# Patient Record
Sex: Male | Born: 1935
Health system: Southern US, Community
[De-identification: ages and names within clinical notes are randomized; demographics above are authoritative.]

## PROBLEM LIST (undated history)

## (undated) DIAGNOSIS — Z95810 Presence of automatic (implantable) cardiac defibrillator: Secondary | ICD-10-CM

## (undated) DIAGNOSIS — I2510 Atherosclerotic heart disease of native coronary artery without angina pectoris: Secondary | ICD-10-CM

## (undated) DIAGNOSIS — E8881 Metabolic syndrome: Secondary | ICD-10-CM

## (undated) DIAGNOSIS — E1140 Type 2 diabetes mellitus with diabetic neuropathy, unspecified: Secondary | ICD-10-CM

## (undated) DIAGNOSIS — N529 Male erectile dysfunction, unspecified: Secondary | ICD-10-CM

## (undated) DIAGNOSIS — I5022 Chronic systolic (congestive) heart failure: Secondary | ICD-10-CM

## (undated) DIAGNOSIS — K2210 Ulcer of esophagus without bleeding: Secondary | ICD-10-CM

## (undated) DIAGNOSIS — E785 Hyperlipidemia, unspecified: Secondary | ICD-10-CM

## (undated) DIAGNOSIS — G4730 Sleep apnea, unspecified: Secondary | ICD-10-CM

## (undated) DIAGNOSIS — I10 Essential (primary) hypertension: Secondary | ICD-10-CM

## (undated) DIAGNOSIS — N200 Calculus of kidney: Secondary | ICD-10-CM

## (undated) DIAGNOSIS — IMO0002 Reserved for concepts with insufficient information to code with codable children: Secondary | ICD-10-CM

## (undated) DIAGNOSIS — Z951 Presence of aortocoronary bypass graft: Secondary | ICD-10-CM

## (undated) DIAGNOSIS — H269 Unspecified cataract: Secondary | ICD-10-CM

## (undated) DIAGNOSIS — I119 Hypertensive heart disease without heart failure: Secondary | ICD-10-CM

## (undated) DIAGNOSIS — D126 Benign neoplasm of colon, unspecified: Secondary | ICD-10-CM

## (undated) DIAGNOSIS — K649 Unspecified hemorrhoids: Secondary | ICD-10-CM

## (undated) DIAGNOSIS — E119 Type 2 diabetes mellitus without complications: Secondary | ICD-10-CM

## (undated) DIAGNOSIS — I255 Ischemic cardiomyopathy: Secondary | ICD-10-CM

## (undated) DIAGNOSIS — M1990 Unspecified osteoarthritis, unspecified site: Secondary | ICD-10-CM

## (undated) HISTORY — DX: Chronic systolic (congestive) heart failure: I50.22

## (undated) HISTORY — DX: Hypertensive heart disease without heart failure: I11.9

## (undated) HISTORY — DX: Morbid (severe) obesity due to excess calories: E66.01

## (undated) HISTORY — PX: CARPAL TUNNEL RELEASE: SHX101

## (undated) HISTORY — DX: Atherosclerotic heart disease of native coronary artery without angina pectoris: I25.10

## (undated) HISTORY — PX: LAPAROTOMY: SHX154

## (undated) HISTORY — PX: CARDIAC DEFIBRILLATOR PLACEMENT: SHX171

## (undated) HISTORY — DX: Presence of aortocoronary bypass graft: Z95.1

## (undated) HISTORY — DX: Essential (primary) hypertension: I10

## (undated) HISTORY — PX: EYE SURGERY: SHX253

## (undated) HISTORY — DX: Unspecified hemorrhoids: K64.9

## (undated) HISTORY — DX: Presence of automatic (implantable) cardiac defibrillator: Z95.810

## (undated) HISTORY — DX: Hyperlipidemia, unspecified: E78.5

## (undated) HISTORY — DX: Calculus of kidney: N20.0

## (undated) HISTORY — DX: Type 2 diabetes mellitus with diabetic neuropathy, unspecified: E11.40

## (undated) HISTORY — PX: CATARACT EXTRACTION: SUR2

## (undated) HISTORY — DX: Male erectile dysfunction, unspecified: N52.9

## (undated) HISTORY — PX: RETINOPATHY SURGERY: SHX765

## (undated) HISTORY — DX: Unspecified osteoarthritis, unspecified site: M19.90

## (undated) HISTORY — PX: OTHER SURGICAL HISTORY: SHX169

## (undated) HISTORY — DX: Benign neoplasm of colon, unspecified: D12.6

## (undated) HISTORY — PX: BACK SURGERY: SHX140

## (undated) HISTORY — DX: Reserved for concepts with insufficient information to code with codable children: IMO0002

## (undated) HISTORY — DX: Unspecified cataract: H26.9

## (undated) HISTORY — DX: Ulcer of esophagus without bleeding: K22.10

## (undated) HISTORY — DX: Metabolic syndrome: E88.810

## (undated) HISTORY — DX: Type 2 diabetes mellitus without complications: E11.9

## (undated) HISTORY — DX: Ischemic cardiomyopathy: I25.5

## (undated) HISTORY — DX: Metabolic syndrome: E88.81

## (undated) HISTORY — PX: ABDOMINAL EXPLORATION SURGERY: SHX538

## (undated) HISTORY — DX: Sleep apnea, unspecified: G47.30

---

## 1998-11-25 ENCOUNTER — Encounter: Payer: Self-pay | Admitting: *Deleted

## 1998-11-25 ENCOUNTER — Ambulatory Visit (HOSPITAL_COMMUNITY): Admission: RE | Admit: 1998-11-25 | Discharge: 1998-11-25 | Payer: Self-pay | Admitting: *Deleted

## 1998-12-10 ENCOUNTER — Ambulatory Visit (HOSPITAL_COMMUNITY): Admission: RE | Admit: 1998-12-10 | Discharge: 1998-12-10 | Payer: Self-pay | Admitting: *Deleted

## 1998-12-10 ENCOUNTER — Encounter: Payer: Self-pay | Admitting: *Deleted

## 1998-12-24 ENCOUNTER — Ambulatory Visit (HOSPITAL_COMMUNITY): Admission: RE | Admit: 1998-12-24 | Discharge: 1998-12-24 | Payer: Self-pay | Admitting: *Deleted

## 1998-12-24 ENCOUNTER — Encounter: Payer: Self-pay | Admitting: *Deleted

## 1999-01-14 ENCOUNTER — Encounter: Payer: Self-pay | Admitting: *Deleted

## 1999-01-14 ENCOUNTER — Ambulatory Visit (HOSPITAL_COMMUNITY): Admission: RE | Admit: 1999-01-14 | Discharge: 1999-01-14 | Payer: Self-pay | Admitting: *Deleted

## 1999-05-26 ENCOUNTER — Other Ambulatory Visit: Admission: RE | Admit: 1999-05-26 | Discharge: 1999-05-26 | Payer: Self-pay | Admitting: Gastroenterology

## 1999-05-26 ENCOUNTER — Encounter (INDEPENDENT_AMBULATORY_CARE_PROVIDER_SITE_OTHER): Payer: Self-pay

## 1999-08-27 ENCOUNTER — Encounter: Payer: Self-pay | Admitting: Family Medicine

## 1999-08-27 ENCOUNTER — Encounter: Admission: RE | Admit: 1999-08-27 | Discharge: 1999-08-27 | Payer: Self-pay | Admitting: Family Medicine

## 1999-09-05 ENCOUNTER — Ambulatory Visit (HOSPITAL_COMMUNITY): Admission: RE | Admit: 1999-09-05 | Discharge: 1999-09-05 | Payer: Self-pay | Admitting: Family Medicine

## 1999-09-05 ENCOUNTER — Encounter: Payer: Self-pay | Admitting: Family Medicine

## 1999-10-21 ENCOUNTER — Other Ambulatory Visit: Admission: RE | Admit: 1999-10-21 | Discharge: 1999-10-21 | Payer: Self-pay | Admitting: Podiatry

## 1999-11-18 ENCOUNTER — Encounter: Admission: RE | Admit: 1999-11-18 | Discharge: 1999-12-17 | Payer: Self-pay | Admitting: *Deleted

## 2000-03-31 ENCOUNTER — Ambulatory Visit (HOSPITAL_COMMUNITY): Admission: RE | Admit: 2000-03-31 | Discharge: 2000-03-31 | Payer: Self-pay | Admitting: Family Medicine

## 2000-03-31 ENCOUNTER — Encounter: Payer: Self-pay | Admitting: Family Medicine

## 2000-10-29 ENCOUNTER — Ambulatory Visit (HOSPITAL_COMMUNITY): Admission: RE | Admit: 2000-10-29 | Discharge: 2000-10-30 | Payer: Self-pay | Admitting: Cardiology

## 2000-10-29 ENCOUNTER — Encounter: Payer: Self-pay | Admitting: Cardiothoracic Surgery

## 2000-11-02 ENCOUNTER — Encounter: Payer: Self-pay | Admitting: Cardiothoracic Surgery

## 2000-11-02 ENCOUNTER — Inpatient Hospital Stay (HOSPITAL_COMMUNITY): Admission: RE | Admit: 2000-11-02 | Discharge: 2000-11-08 | Payer: Self-pay | Admitting: Cardiothoracic Surgery

## 2000-11-02 DIAGNOSIS — Z951 Presence of aortocoronary bypass graft: Secondary | ICD-10-CM

## 2000-11-02 HISTORY — DX: Presence of aortocoronary bypass graft: Z95.1

## 2000-11-03 ENCOUNTER — Encounter: Payer: Self-pay | Admitting: Cardiothoracic Surgery

## 2000-11-04 ENCOUNTER — Encounter: Payer: Self-pay | Admitting: Cardiothoracic Surgery

## 2000-11-05 ENCOUNTER — Encounter: Payer: Self-pay | Admitting: Cardiothoracic Surgery

## 2000-11-06 ENCOUNTER — Encounter: Payer: Self-pay | Admitting: Cardiothoracic Surgery

## 2000-12-14 ENCOUNTER — Encounter (HOSPITAL_COMMUNITY): Admission: RE | Admit: 2000-12-14 | Discharge: 2001-03-14 | Payer: Self-pay | Admitting: Cardiology

## 2001-05-19 ENCOUNTER — Encounter: Admission: RE | Admit: 2001-05-19 | Discharge: 2001-05-19 | Payer: Self-pay | Admitting: Cardiothoracic Surgery

## 2001-05-19 ENCOUNTER — Encounter: Payer: Self-pay | Admitting: Cardiothoracic Surgery

## 2001-08-31 HISTORY — PX: DOPPLER ECHOCARDIOGRAPHY: SHX263

## 2002-01-13 ENCOUNTER — Inpatient Hospital Stay (HOSPITAL_COMMUNITY): Admission: EM | Admit: 2002-01-13 | Discharge: 2002-01-17 | Payer: Self-pay | Admitting: Emergency Medicine

## 2002-01-13 ENCOUNTER — Encounter: Payer: Self-pay | Admitting: Emergency Medicine

## 2002-01-17 ENCOUNTER — Encounter: Payer: Self-pay | Admitting: Internal Medicine

## 2004-02-11 ENCOUNTER — Encounter: Admission: RE | Admit: 2004-02-11 | Discharge: 2004-02-11 | Payer: Self-pay | Admitting: Cardiology

## 2004-07-10 ENCOUNTER — Ambulatory Visit: Payer: Self-pay

## 2004-08-12 ENCOUNTER — Encounter: Admission: RE | Admit: 2004-08-12 | Discharge: 2004-08-12 | Payer: Self-pay | Admitting: Cardiology

## 2004-08-31 DIAGNOSIS — D126 Benign neoplasm of colon, unspecified: Secondary | ICD-10-CM

## 2004-08-31 HISTORY — DX: Benign neoplasm of colon, unspecified: D12.6

## 2004-11-18 ENCOUNTER — Ambulatory Visit: Payer: Self-pay | Admitting: Gastroenterology

## 2004-11-25 ENCOUNTER — Ambulatory Visit: Payer: Self-pay | Admitting: Gastroenterology

## 2004-12-03 ENCOUNTER — Encounter (INDEPENDENT_AMBULATORY_CARE_PROVIDER_SITE_OTHER): Payer: Self-pay | Admitting: Specialist

## 2004-12-03 ENCOUNTER — Ambulatory Visit (HOSPITAL_COMMUNITY): Admission: RE | Admit: 2004-12-03 | Discharge: 2004-12-03 | Payer: Self-pay | Admitting: Gastroenterology

## 2004-12-03 ENCOUNTER — Ambulatory Visit: Payer: Self-pay | Admitting: Gastroenterology

## 2005-08-27 ENCOUNTER — Ambulatory Visit: Payer: Self-pay | Admitting: Family Medicine

## 2005-10-27 ENCOUNTER — Encounter: Admission: RE | Admit: 2005-10-27 | Discharge: 2005-10-27 | Payer: Self-pay | Admitting: Cardiology

## 2006-03-08 ENCOUNTER — Ambulatory Visit: Payer: Self-pay | Admitting: Internal Medicine

## 2006-10-19 ENCOUNTER — Encounter: Admission: RE | Admit: 2006-10-19 | Discharge: 2006-10-19 | Payer: Self-pay | Admitting: Cardiology

## 2007-03-08 ENCOUNTER — Ambulatory Visit: Payer: Self-pay | Admitting: Internal Medicine

## 2007-11-01 ENCOUNTER — Ambulatory Visit: Payer: Self-pay | Admitting: Gastroenterology

## 2007-12-07 ENCOUNTER — Encounter: Payer: Self-pay | Admitting: Gastroenterology

## 2007-12-07 ENCOUNTER — Ambulatory Visit: Payer: Self-pay | Admitting: Gastroenterology

## 2008-04-03 ENCOUNTER — Ambulatory Visit: Payer: Self-pay | Admitting: Internal Medicine

## 2008-10-09 ENCOUNTER — Encounter: Admission: RE | Admit: 2008-10-09 | Discharge: 2008-10-09 | Payer: Self-pay | Admitting: Cardiology

## 2008-10-24 ENCOUNTER — Encounter: Payer: Self-pay | Admitting: Internal Medicine

## 2009-01-30 ENCOUNTER — Ambulatory Visit: Payer: Self-pay | Admitting: Internal Medicine

## 2009-01-30 ENCOUNTER — Encounter: Payer: Self-pay | Admitting: Internal Medicine

## 2009-01-30 DIAGNOSIS — E1140 Type 2 diabetes mellitus with diabetic neuropathy, unspecified: Secondary | ICD-10-CM

## 2009-01-30 DIAGNOSIS — I5022 Chronic systolic (congestive) heart failure: Secondary | ICD-10-CM

## 2009-01-30 DIAGNOSIS — E1169 Type 2 diabetes mellitus with other specified complication: Secondary | ICD-10-CM | POA: Insufficient documentation

## 2009-01-30 DIAGNOSIS — E1165 Type 2 diabetes mellitus with hyperglycemia: Secondary | ICD-10-CM

## 2009-01-30 DIAGNOSIS — I509 Heart failure, unspecified: Secondary | ICD-10-CM

## 2009-01-30 DIAGNOSIS — I255 Ischemic cardiomyopathy: Secondary | ICD-10-CM

## 2009-01-30 DIAGNOSIS — I110 Hypertensive heart disease with heart failure: Secondary | ICD-10-CM | POA: Insufficient documentation

## 2009-08-05 ENCOUNTER — Encounter: Payer: Self-pay | Admitting: Internal Medicine

## 2009-08-06 ENCOUNTER — Ambulatory Visit: Payer: Self-pay | Admitting: Internal Medicine

## 2009-08-06 DIAGNOSIS — K208 Other esophagitis: Secondary | ICD-10-CM

## 2009-08-06 DIAGNOSIS — R55 Syncope and collapse: Secondary | ICD-10-CM

## 2009-08-06 DIAGNOSIS — E8881 Metabolic syndrome: Secondary | ICD-10-CM | POA: Insufficient documentation

## 2009-08-06 DIAGNOSIS — E785 Hyperlipidemia, unspecified: Secondary | ICD-10-CM

## 2009-08-06 HISTORY — DX: Syncope and collapse: R55

## 2009-08-26 ENCOUNTER — Ambulatory Visit: Payer: Self-pay | Admitting: Internal Medicine

## 2009-08-29 LAB — CONVERTED CEMR LAB
BUN: 25 mg/dL — ABNORMAL HIGH (ref 6–23)
Basophils Absolute: 0 10*3/uL (ref 0.0–0.1)
CO2: 28 meq/L (ref 19–32)
Calcium: 9.2 mg/dL (ref 8.4–10.5)
Chloride: 103 meq/L (ref 96–112)
Hemoglobin: 13.5 g/dL (ref 13.0–17.0)
Lymphs Abs: 2.1 10*3/uL (ref 0.7–4.0)
MCHC: 35.1 g/dL (ref 30.0–36.0)
MCV: 88.4 fL (ref 78.0–100.0)
Monocytes Absolute: 0.5 10*3/uL (ref 0.1–1.0)
Monocytes Relative: 7.6 % (ref 3.0–12.0)
Neutro Abs: 3.9 10*3/uL (ref 1.4–7.7)
Platelets: 199 10*3/uL (ref 150.0–400.0)
Potassium: 4.3 meq/L (ref 3.5–5.1)
Prothrombin Time: 11 s (ref 9.1–11.7)
RDW: 14.3 % (ref 11.5–14.6)
Sodium: 140 meq/L (ref 135–145)

## 2009-09-02 ENCOUNTER — Observation Stay (HOSPITAL_COMMUNITY): Admission: RE | Admit: 2009-09-02 | Discharge: 2009-09-02 | Payer: Self-pay | Admitting: Internal Medicine

## 2009-09-02 ENCOUNTER — Ambulatory Visit: Payer: Self-pay | Admitting: Internal Medicine

## 2009-09-04 ENCOUNTER — Encounter: Payer: Self-pay | Admitting: Internal Medicine

## 2009-09-06 ENCOUNTER — Telehealth: Payer: Self-pay | Admitting: Internal Medicine

## 2009-09-16 ENCOUNTER — Ambulatory Visit: Payer: Self-pay

## 2009-09-16 ENCOUNTER — Encounter: Payer: Self-pay | Admitting: Internal Medicine

## 2009-09-19 ENCOUNTER — Encounter: Payer: Self-pay | Admitting: Internal Medicine

## 2010-09-21 ENCOUNTER — Encounter: Payer: Self-pay | Admitting: Family Medicine

## 2010-09-23 ENCOUNTER — Ambulatory Visit
Admission: RE | Admit: 2010-09-23 | Discharge: 2010-09-23 | Payer: Self-pay | Source: Home / Self Care | Attending: Internal Medicine | Admitting: Internal Medicine

## 2010-09-23 ENCOUNTER — Encounter: Payer: Self-pay | Admitting: Internal Medicine

## 2010-09-30 NOTE — Cardiovascular Report (Signed)
Summary: Office Visit   Office Visit   Imported By: Sallee Provencal 09/18/2009 13:54:12  _____________________________________________________________________  External Attachment:    Type:   Image     Comment:   External Document

## 2010-09-30 NOTE — Progress Notes (Signed)
Summary: PT DEF IS BEEPING   Phone Note Call from Patient Call back at Home Phone [phone]   Caller: Patient Summary of Call: pt calling regarding his def he said it is beeping. Initial call taken by: Delsa Sale,  September 06, 2009 8:40 AM  Follow-up for Phone Call        Pt will come this morning for evaluation. Chanetta Marshall RN BSN  September 06, 2009 8:55 AM

## 2010-09-30 NOTE — Letter (Signed)
Summary: [facility]  [facility]   Imported By: Marilynne Drivers 11/06/2009 11:47:02  _____________________________________________________________________  External Attachment:    Type:   Image     Comment:   External Document

## 2010-09-30 NOTE — Miscellaneous (Signed)
Summary: ICD serial # correction  Clinical Lists Changes  Observations: Added new observation of ICD SERL#: [id number] (09/16/2009 9:03)       ICD Specifications Following MD:  Cristopher Peru, MD     Referring MD:  Carlyn Reichert ICD Vendor:  Medtronic     ICD Model Number:  [phone]     ICD Serial Number:  [id number] ICD DOI:  09/02/2009     ICD Implanting MD:  Cristopher Peru, MD  Lead 1:    Location: RA     DOI: 01/16/2002     Model #: [id number]     Serial #: [id number]     Status: active Lead 2:    Location: RV     DOI: 01/16/2002     Model #: [id number]     Serial #: [id number]     Status: active  Indications::  CARDIOMYOPATHY  Explantation Comments: 09/02/2009 [facility] Scientific Prizm 1861/250011 explanted.  ICD Follow Up ICD Dependent:  No      Episodes Coumadin:  No  Brady Parameters Mode VVI     Lower Rate Limit:  40      Tachy Zones VF:  210     VT:  170

## 2010-09-30 NOTE — Miscellaneous (Signed)
Summary: Device change out  Clinical Lists Changes  Observations: Added new observation of ICD IMPL DTE: 09/02/2009 (09/04/2009 14:03) Added new observation of ICD SERL#: [id number] (09/04/2009 14:03) Added new observation of ICD MODL#: [id number] (09/04/2009 14:03) Added new observation of ICDMANUFACTR: Medtronic (09/04/2009 14:03) Added new observation of ICDEXPLCOMM: 09/02/2009 [employer] 1861/250011 explanted. (09/04/2009 14:03)       ICD Specifications Following MD:  Cristopher Peru, MD     Referring MD:  Carlyn Reichert ICD Vendor:  Medtronic     ICD Model Number:  [phone]     ICD Serial Number:  [id number] ICD DOI:  09/02/2009     ICD Implanting MD:  Cristopher Peru, MD  Lead 1:    Location: RA     DOI: 01/16/2002     Model #: [id number]     Serial #: [id number]     Status: active Lead 2:    Location: RV     DOI: 01/16/2002     Model #: [id number]     Serial #: [id number]     Status: active  Indications::  CARDIOMYOPATHY  Explantation Comments: 09/02/2009 [facility] Scientific Prizm 1861/250011 explanted.  ICD Follow Up ICD Dependent:  No      Episodes Coumadin:  No  Brady Parameters Mode VVI     Lower Rate Limit:  40      Tachy Zones VF:  210     VT:  170

## 2010-09-30 NOTE — Procedures (Signed)
Summary: wch/jss    Allergies: No Known Drug Allergies   ICD Specifications Following MD:  Cristopher Peru, MD     Referring MD:  Carlyn Reichert ICD Vendor:  Medtronic     ICD Model Number:  [phone]     ICD Serial Number:  [id number] ICD DOI:  09/02/2009     ICD Implanting MD:  Cristopher Peru, MD  Lead 1:    Location: RA     DOI: 01/16/2002     Model #: [id number]     Serial #: [id number]     Status: active Lead 2:    Location: RV     DOI: 01/16/2002     Model #: [id number]     Serial #: [id number]     Status: active  Indications::  CARDIOMYOPATHY  Explantation Comments: 09/02/2009 [facility] Scientific Prizm 1861/250011 explanted.  ICD Follow Up Remote Check?  No Battery Voltage:  3.18 V     Charge Time:  9.0 seconds     Underlying rhythm:  SR ICD Dependent:  No       ICD Device Measurements Atrium:  Amplitude: 1.5 mV, Impedance: 418 ohms, Threshold: 1.25 V at 0.4 msec Right Ventricle:  Amplitude: 16.9 mV, Impedance: 380 ohms, Threshold: 1.0 V at 0.4 msec Shock Impedance: 42/56 ohms   Episodes MS Episodes:  0     Coumadin:  No Shock:  0     ATP:  0     Nonsustained:  0     Atrial Pacing:  40.4%     Ventricular Pacing:  <0.1%  Brady Parameters Mode DDD     Lower Rate Limit:  60     Upper Rate Limit 130 PAV 330     Sensed AV Delay:  330  Tachy Zones VF:  207     VT:  171     Next Cardiology Appt Due:  11/29/2009 Tech Comments:  Wound check appt.  Steri-strips removed by patient.  No redness or edema,  wound well healed.  Normal device function.  RV output decreased to 2.5V today because of chronic leads.  No other changes made.  Dr Wynonia Lawman is primary cards, unsure of follow-up plan.  For now, will put in reminder for Dr Lovena Le for Jan/2012.  Chanetta Marshall RN BSN  September 16, 2009 2:14 PM  MD Comments:  Agree with above.

## 2010-09-30 NOTE — Cardiovascular Report (Signed)
Summary: Office Visit   Office Visit   Imported By: Sallee Provencal 09/12/2009 10:44:30  _____________________________________________________________________  External Attachment:    Type:   Image     Comment:   External Document

## 2010-10-02 NOTE — Assessment & Plan Note (Signed)
Summary: pc2  Medications Added PRILOSEC OTC 20 MG TBEC (OMEPRAZOLE MAGNESIUM) two times a day MELOXICAM 7.5 MG TABS (MELOXICAM) two times a day      Allergies Added: NKDA  Visit Type:  Follow-up Primary Provider:  Edrick Oh, MD   History of Present Illness: Mr. Magness returns today for followup.  He is a pleasant obese 77 yo man with a h/o CHF, s/p ICD, morbid obesity and HTN.  He underwent ICD generator change several months ago with a BIV device placed and the LV lead capped.  At the time it was not thought that his CHF was bad enough to place an LV lead but there was concern that over time it would worsen and he would subsequently require the lead. He has class 2 symptoms. He has had difficulty losing weight. No other complaints.   Current Medications (verified): 1)  Metformin Hcl 1000 Mg Tabs (Metformin Hcl) .... One Half By Mouth Two Times A Day 2)  Clonidine Hcl 0.1 Mg Tabs (Clonidine Hcl) .... One By Mouth Two Times A Day 3)  Glipizide 10 Mg Xr24h-Tab (Glipizide) .... One By Mouth Daily 4)  Furosemide 40 Mg Tabs (Furosemide) .... One By Mouth Two Times A Day 5)  Klor-Con M20 20 Meq Cr-Tabs (Potassium Chloride Crys Cr) .... One By Mouth Daily 6)  Lipitor 40 Mg Tabs (Atorvastatin Calcium) .... One By Mouth Daily 7)  Metoprolol Tartrate 25 Mg Tabs (Metoprolol Tartrate) .... One By Mouth Two Times A Day 8)  Quinapril Hcl 40 Mg Tabs (Quinapril Hcl) .... One By Mouth Daily 9)  Aspirin 81 Mg Tbec (Aspirin) .... One By Mouth Daily 10)  Novolog 100 Unit/ml Soln (Insulin Aspart) .... Uad 11)  Lantus 100 Unit/ml Soln (Insulin Glargine) .... Uad 12)  Prilosec Otc 20 Mg Tbec (Omeprazole Magnesium) .... Two Times A Day 13)  Meloxicam 7.5 Mg Tabs (Meloxicam) .... 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   Back surgery x 3.   Rotator cuff surgery.   Carpal tunnel surgery x 2.   Abdominal surgery with exploration and laparotomy.  Review of Systems       The patient complains of dyspnea on exertion.  The patient denies chest pain, syncope, and peripheral edema.    Vital Signs:  Patient profile:   77 year old male Height:      65 inches Weight:      247 pounds BMI:     41.25 Pulse rate:   61 / minute BP sitting:   148 / 70  (left arm)  Vitals Entered By: Margaretmary Bayley CMA (September 23, 2010 10:00 AM)  Physical Exam  General:  Well developed, well nourished, in no acute distress.obese.   Head:  normocephalic and atraumatic Eyes:  PERRLA/EOM intact; conjunctiva and lids normal. Mouth:  Teeth, gums and palate normal. Oral mucosa normal. Neck:  Neck supple, no JVD. No masses, thyromegaly or abnormal cervical nodes. Chest Wall:  Well healed ICD incision. Lungs:  Clear bilaterally to auscultation with no wheezes rales, or rhonchi. Heart:  Distant.  RRR without murmur.  PMI could not be appreciated.  No S3 or S4 heard. Abdomen:  Bowel sounds positive; abdomen soft and non-tender without masses, organomegaly, or hernias noted. No hepatosplenomegaly. Msk:  Back normal, normal gait. Muscle strength and tone normal. Pulses:  pulses normal in all 4 extremities Extremities:  No clubbing or cyanosis. Neurologic:  Alert and oriented x 3.    ICD Specifications Following MD:  Cristopher Peru, MD      Referring MD:  Carlyn Reichert ICD Vendor:  Medtronic     ICD Model Number:  [phone]     ICD Serial Number:  [id number] ICD DOI:  09/02/2009     ICD Implanting MD:  Cristopher Peru, MD  Lead 1:    Location: RA     DOI: 01/16/2002     Model #: [id number]     Serial #: [id number]     Status: active Lead 2:    Location: RV     DOI: 01/16/2002     Model #: [id number]     Serial #: [id number]     Status: active  Indications::  CARDIOMYOPATHY  Explantation Comments: 09/02/2009 [facility] Scientific Prizm 1861/250011 explanted.  ICD Follow Up Remote Check?  No Battery Voltage:  3.14 V     Charge Time:  9.1 seconds     Underlying rhythm:  SR1.6 ICD Dependent:  No       ICD Device Measurements Atrium:  Amplitude: 1.6 mV, Impedance: 437 ohms, Threshold: 1.25 V at 0.4 msec Right Ventricle:  Amplitude: 13.9 mV, Impedance: 437 ohms, Threshold: 1.0 V at 0.4 msec Shock Impedance: 47/63 ohms   Episodes MS Episodes:  0     Percent Mode Switch:  0     Coumadin:  No Shock:  0     ATP:  0     Nonsustained:  0     Atrial Pacing:  36.9%     Ventricular Pacing:  0.2%  Brady Parameters Mode DDD     Lower Rate Limit:  60     Upper Rate Limit 130 PAV 330     Sensed AV Delay:  330  Tachy Zones VF:  207     VT:  171     Next Cardiology Appt Due:  09/01/2011 Tech Comments:  Lead impedance alert values reprogrammed.   Device function normal.  ROV 1 year with Dr. Lovena Le.  Pt. also follows with Dr. Wynonia Lawman MD Comments:  Agree with above.   Impression & Recommendations:  Problem # 1:  CHRONIC SYSTOLIC HEART FAILURE (123456) His symptoms are class 2.  Continue meds as below. His updated medication list for this problem includes:    Furosemide 40 Mg Tabs (Furosemide) ..... One by mouth two times a day    Metoprolol Tartrate 25 Mg Tabs (Metoprolol tartrate) ..... One by mouth two times a day    Quinapril Hcl 40 Mg Tabs (Quinapril hcl) ..... One by mouth daily    Aspirin 81 Mg Tbec (Aspirin) ..... One by mouth daily  Problem # 2:   [place] 2 DR 1861/250011 (ICD-V45.02) His device is working normally. Impedences are stable and he has had no intercurrent ICD therapies.  Problem # 3:  MORBID OBESITY (ICD-278.01) I have encouraged him to start walking more and eating less.  Patient Instructions: 1)  Your physician wants you to follow-up in:  12 months with Dr  Lovena Le You will receive a reminder letter in the mail two months in advance. If you don't receive a letter, please call our office to schedule the follow-up appointment. 2)  Your physician recommends that you continue on your current medications as directed. Please refer to the Current Medication list given to you today.

## 2010-10-08 NOTE — Cardiovascular Report (Signed)
Summary: Office Visit   Office Visit   Imported By: Sallee Provencal 10/01/2010 15:30:30  _____________________________________________________________________  External Attachment:    Type:   Image     Comment:   External Document

## 2011-01-13 NOTE — Assessment & Plan Note (Signed)
[facility]                         ELECTROPHYSIOLOGY OFFICE NOTE   NAME:Manuel Kline, Manuel Kline                          MRN:          [id number]  DATE:03/08/2007                            DOB:          19-Sep-1931    Manuel Kline returns today for followup.  He is a very pleasant middle age  man, moderately obese with ischemic cardiomyopathy and ICD implantation.  He has a history of syncope with left bundle-branch block.  The patient,  despite all the above, has no severe heart failure.  He is at worst  class II if not class I.  He denies any weakness or other symptoms.   PHYSICAL EXAMINATION:  .  GENERAL:  He is a pleasant middle age man in no distress.  VITAL SIGNS:  The blood pressure today was 116/76, the pulse 74 and  regular.  The respirations were 18, and the weight was 260 pounds.  NECK:  No jugular venous distention.  LUNGS:  Clear bilaterally to auscultation.  No wheezes, rales, or  rhonchi.  CARDIOVASCULAR:  Regular rate and rhythm with normal S1 and S2.  EXTREMITIES:  No edema.   Interrogation of his defibrillator demonstrates a Guidant Prism 2 with P  and R waves of 3 and 9, respectively.  The impedance 564 in the atrium,  534 in the ventricle with a threshold volt of 0.4 in the atrium, 0.6.4  in the ventricle.  The battery voltage 2.61 ohms (MOL2).   IMPRESSION:  1. Ischemic cardiomyopathy.  2. Congestive heart failure.  3. Left bundle-branch block.  4. History of syncope status post none since his device was placed.   DISCUSSION:  Overall, Manuel Kline is stable.  His defibrillator is working  normally.  We will plan to see him back in the office for followup in 1  year.     Manuel Kline. Lovena Le, MD  Electronically Signed    GWT/MedQ  DD: 03/08/2007  DT: 03/08/2007  Job #: [id number]   cc:   Ezzard Standing, M.D.

## 2011-01-13 NOTE — Assessment & Plan Note (Signed)
[facility]                         GASTROENTEROLOGY OFFICE NOTE   NAME:Manuel Kline, Manuel Kline                          MRN:          [id number]  DATE:11/01/2007                            DOB:          Sep 24, 1931    Mr. Helmly is a 77 year old white male diabetic with rather severe  cardiovascular problems revolving around the ischemic cardiomyopathy and  recurrent cardiac arrhythmia requiring external defibrillator  implantation by Dr. Cristopher Peru.  He comes to my office today for  screening for follow-up colonoscopy per his history of colon polyps  going back some 20  years with a colonoscopy three years ago showing  right colon polyps which were removed without difficulty.   Mr. Defreese denies any GI complaint at this time, is having regular bowel  movements without melena or hematochezia.  His appetite is good and his  weight is stable.  He denies upper GI problems.     Loralee Pacas. Sharlett Iles, MD, Quentin Ore, FAGA  Electronically Signed    DRP/MedQ  DD: 11/01/2007  DT: 11/02/2007  Job #: [id number]   cc:   Margarita Rana, M.D.  Juanda Bond. Altheimer, M.D.  Ezzard Standing, M.D.  Champ Mungo. Lovena Le, MD

## 2011-01-13 NOTE — Assessment & Plan Note (Signed)
[facility]                         ELECTROPHYSIOLOGY OFFICE NOTE   NAME:Bolls, ROMEY MARTENSEN                          MRN:          [id number]  DATE:04/03/2008                            DOB:          09/20/1931    Mr. Gleghorn returns today for followup.  He is a very pleasant middle-aged  male with a history of syncope and ischemic cardiomyopathy, who is  status post dual-chamber ICD insertion for all of the above in the  setting of left bundle-branch block.  He returns today for followup.  The patient had no specific complaints today.  He denies chest pain.  He  notes that he had some peripheral edema several weeks ago when he went  up to visit his brother [place] and does admit to some dietary  indiscretion during that visit.  He has had no other symptoms and denies  any intercurrent ICD therapies.   MEDICATIONS:  1. Furosemide 40 twice a day.  2. Actos 40 a day.  3. Metformin 1 g twice daily.  4. Potassium 10, 2 tablets daily.  5. Quinapril 40 mg daily.  6. Clonidine 0.1 twice daily.  7. Lipitor 40 a day.  8. Aspirin 81 a day.  9. Metoprolol 25 twice a day.  10.Glipizide ER 10 mg daily.  11.Multiple vitamin.  12.Lantus 15 units nightly.  13.Glucotrol XL 10 mg daily.   PHYSICAL EXAMINATION:  GENERAL:  He is a pleasant, obese, middle-aged  man, in no acute distress.  VITAL SIGNS:  Blood pressure was 110/70; the pulse 68 and regular;  respirations were 18; and the weight was 261 pounds, down 2 pounds from  his most recent visit.  NECK:  No jugular venous distention.  LUNGS:  Clear bilaterally to auscultation.  No wheezes, rales, or  rhonchi were present.  CARDIOVASCULAR:  Regular rate and rhythm.  Normal S1 and S2.  ABDOMEN:  Soft and nontender.  EXTREMITIES:  No edema.   Interrogation of his defibrillator demonstrates a Guidant Prizm dual-  chamber device with P-waves of 3 and R-waves of 8.  Impedance was 527 in  the atrium and 522 in the  ventricle.  The threshold volt of 0.4 in the A  and 0.84 in the V.  Battery voltage was 2.58 volts (MOL2).  There are no  intercurrent ICD therapies.   IMPRESSION:  1. Ischemic cardiomyopathy.  2. Syncope.  3. Left bundle-branch block.  4. Status post dual-chamber implantable cardioverter-defibrillator      insertion discussed and overall Mr. Ines is stable.  His heart      failure is class II.  I will plan to see the patient back in the      office in 1 year for followup.  I have encouraged him to maintain a      low-sodium diet.     Champ Mungo. Lovena Le, MD  Electronically Signed    GWT/MedQ  DD: 04/03/2008  DT: 04/04/2008  Job #: [id number]   cc:   Ezzard Standing, M.D.

## 2011-01-13 NOTE — Assessment & Plan Note (Signed)
[facility] OFFICE NOTE   NAME:Hazan, JEKAI BARBERI                          MRN:          [id number]  DATE:11/01/2007                            DOB:          1931-09-08    PRIMARY CARE PHYSICIAN:  Juanda Bond. Altheimer, MD   Mr. Pretorius is a 77 year old white male diabetic with rather severe  cardiovascular problems revolving around the ischemic cardiomyopathy and  recurrent cardiac arrhythmia requiring external defibrillator  implantation by Dr. Cristopher Peru.  He comes to my office today for  screening for follow-up colonoscopy per his history of colon polyps  going back some 20  years with a colonoscopy three years ago showing  right colon polyps which were removed without difficulty.   Mr. Kapner denies any GI complaint at this time, is having regular bowel  movements without melena or hematochezia.  His appetite is good and his  weight is stable.  He denies upper GI problems.   PAST MEDICAL HISTORY:  Mr. Pelly has insulin-dependent diabetes, chronic  cardiomyopathy, previous bypass surgery, has a defibrillator placed by  Dr. Cristopher Peru.  He is followed from a cardiology stand point also by  Dr. Tollie Eth, has a history of left bundle branch block and  chronic congestive heart failure.   When I previously saw Mr.  Shim in March of 2006 he was having some  rectal bleeding and otherwise had a negative colonoscopy.  He denies  significant problems from his diabetes in terms of kidney problems, but  does have a history of recurrent nephrolithiasis, and also has a history  of sleep apnea.   MEDICATIONS:  1. Lasix 40 mg twice a day.  2. Actos 45 mg a day.  3. Metformin 1000 mg twice a day.  4. Potassium 20 mEq a day.  5. Quinapril 40 mg a day.  6. Clonidine 0.1 mg twice a day .  7. Lipitor 40 mg a day.  8. Aspirin 81 mg a day.  9. Metoprolol 25 mg twice a day.  10.Glipizide ER 10 mg a day.  11.Lantus 50  units at bedtime.   ALLERGIES:  HE DENIES DRUG ALLERGIES.   FAMILY HISTORY:  Noncontributory in terms of known gastrointestinal  problems.   SOCIAL HISTORY:  The patient is married, lives with his wife and is  retired from plumbing.  He does not smoke or use ethanol.   REVIEW OF SYSTEMS:  Negative today for any active cardiovascular or  pulmonary complaints.  He is apparently doing well with his diabetes,  which is followed by Dr. Elyse Hsu.  Review of systems otherwise  noncontributory.   LABORATORY DATA:  Review of his lab data from March 2006 showed normal  CBC and liver profile.   PHYSICAL EXAMINATION:  GENERAL:  He is an obese-appearing white male in  no acute distress, appearing older than his stated age.  He weighs 264  pounds.  Blood pressure 130/70, and pulse was 72 and regular.  I could  not appreciate stigmata of chronic liver disease.  CHEST:  His chest was generally clear  and he appeared to be in an  irregular rhythm without significant murmurs, gallops, or rubs.  ABDOMEN:  His abdomen was extremely obese without definite organomegaly,  masses or tenderness.  There was trace to +1 peripheral edema without  swollen joints or evidence of acute phlebitis.  MENTAL STATUS:  His mental status was clear.  RECTAL:  Deferred.   ASSESSMENT:  1. A long history of chronic recurrent colon polyps in a 77 year old      white male who has multiple medical problems including cardiac      arrhythmias and a permanent defibrillator implantation.  2. Past history of hemorrhoidal rectal bleeding.  3. Multiple cardiovascular issues as mentioned above.  4. Insulin-dependent diabetes.  5. History of ischemic cardiomyopathy.  6. History of sleep apnea.  7. Chronic aspirin therapy.  8. History of acid reflux disease, previous endoscopy in 2000.  He is      not on proton pump inhibitor therapy at this time.   RECOMMENDATIONS:  I have gone ahead and scheduled Mr. Ripple for  outpatient  colonoscopy at his convenience.  Will make adjustments in his  diabetic medications, but continue him on salicylate therapy.  If he  needs electric collar, we will turn off his defibrillator  during the procedure as per standard protocol.  Will continue all of his  other multiple medications as per his multiple physicians.     Loralee Pacas. Sharlett Iles, MD, Quentin Ore, FAGA  Electronically Signed    DRP/MedQ  DD: 11/01/2007  DT: 11/01/2007  Job #: [id number]   cc:   Margarita Rana, M.D.  Juanda Bond. Altheimer, M.D.  Ezzard Standing, M.D.  Champ Mungo. Lovena Le, MD

## 2011-01-16 NOTE — Procedures (Signed)
[place]. [facility]  Patient:    Manuel Kline, Manuel Kline Visit Number: [id number] MRN: [id number]          Service Type: MED Location: [id number] Attending Physician:  Monico Blitz Dictated by:   Champ Mungo. Lovena Le, M.D. [facility] Proc. Date: 01/16/02 Admit Date:  01/13/2002 Discharge Date: 01/17/2002   CC:         Kerry Hough., M.D.  Juanda Bond. Altheimer, M.D.  Robert A. Hurman Horn., M.D.  Wynona Dove, [facility]   Procedure Report  PROCEDURE PERFORMED:  Implantation of a dual chamber defibrillator.  INDICATIONS:  Ischemic cardiomyopathy with an ejection fraction of 25-30%, syncope worrisome for malignant ventricular arrhythmias in the setting of chronic left bundle branch block.  INTRODUCTION:  The patient is a very pleasant 77 year old man with a history of ischemic heart disease with an ejection fraction of 30-40% in the past. He underwent CABG over a year ago. His ejection fraction today by echo was 25-30%. He was admitted after a syncopal episode where he was said to have been cyanotic and had seizure-like activity. CT scan was negative. The patient has left bundle branch block. He has no heart failure symptoms. He is now referred for ICD implantation.  DESCRIPTION OF PROCEDURE:  After informed consent was obtained, the patient was taken to the diagnostic EP Lab in a fasting state. After the usual preparation and draping, intravenous fentanyl and midazolam was given for sedation. A total of 30 cc of lidocaine was infiltrated into the left infraclavicular region. Electrocautery was utilized to dissect down to the subpectoralis fascia after a 10 cm incision had been placed. The left subclavian vein was demonstrated to be patent after 10 cc of contrast. Dye was injected into the left upper extremity. The vein was punctured x2 with a Guidant Model [id number] bipolar atrial pacing lead and was placed in the right atrium and the  Guidant Model [id number] defibrillation lead was placed in the right ventricle. Mapping was carried out in the right ventricle and out at the RV apex. R waves measured 12 mV with a pacing impedance after the lead was actively fixed was 729 ohms with a pacing threshold of 0.8 V at 0.5 msec. Mapping was then carried out in the atrium with the atrial lead placed in the remnant of the right atrial appendage. At this location P waves measured 2 mV and the pacing threshold was 0.8 V at 0.5 msec with again a pacing impedance of 527 ohms. With both leads in satisfactory position and actively fixed, they were secured to the subpectoralis fascia with figure-of-eight silk sutures. Electrocautery was then utilized to make a subcutaneous pocket. Electrocautery was also utilized to maintain hemostasis. The Guidant Prism 2DR [place] [id number], Serial #[id number] dual chamber defibrillation system was subsequently connected to the defibrillator and pacing lead and secured and placed in the subcutaneous pocket. Kanamycin irrigation utilized to irrigate the pocket before and after insertion of the defibrillator. A silk suture was utilized to secure the generator to the fascia. At this point defibrillation threshold testing was carried out.  After the patient was more deeply sedated with intravenous fentanyl and midazolam, VF was induced with a T wave shock. A 14 joule shock subsequently terminated the ventricular fibrillation and restored sinus rhythm. Five minutes was allowed to elapse and a second DFT test was carried. Again VF was induced with a T wave shock and a 14 joule shock through the defibrillator restored sinus rhythm.  The duration was 7 seconds for each defibrillation test. The defibrillation impendance were 47 and 48 ohms respectively for each test.  At this point no additional DFT testing was carried out and the generator was secured with silk suture. Additional Kanamycin was utilized to irrigate  the incision and the incision closed in a layer of 2-0 Vicryl followed by a layer of 3-0 Vicryl, followed by a layer of 4-0 Vicryl. Benzoin was painted on the skin and Steri-Strips were applied and a pressure dressing placed and the patient returned to his room in satisfactory condition.  COMPLICATIONS:  There were no immediate procedural complications.  RESULTS:  This demonstrates successful implantation of a Guidant dual chamber defibrillation system in a patient with ischemic cardiomyopathy and an ejection fraction of 25-30%, status post coronary artery bypass grafting, with a history of syncope worrisome for malignant ventricular arrhythmias and left bundle branch block. Dictated by:   Champ Mungo. Lovena Le, M.D. [place] Attending Physician:  Monico Blitz DD:  01/16/02 TD:  01/18/02 Job: [id number]

## 2011-01-16 NOTE — Consult Note (Signed)
[place]  Patient:    Manuel Kline, Manuel Kline                          MRN: [id number] Proc. Date: 10/29/00 Adm. Date:  [id number] Disc. Date: [id number] Attending:  Monico Blitz                          Consultation Report  In-house consult requested by Dr. Wynonia Lawman for coronary occlusive disease. The patient is also followed by Dr. Elyse Hsu, endocrinologist and Dr. Mariea Clonts, [facility].  HISTORY OF PRESENT ILLNESS:  The patient is a 77 year old male with known coronary artery disease who had previously undergone cardiac catheterization in 1992 because of chest pain.  At that time, he had a 40-50% left anterior descending with disease.  In 1999, he had a negative stress test, but reduced exercise tolerance.  He has been diagnosed with diabetes and has complications of retinopathy and peripheral neuropathy.  Most recently, over the past month, the patient has had several episodes of shortness of breath and chest discomfort, nonradiating when in stressful situations or when becomes excited, such as watching wrestling on television. He has had some diaphoresis with episodes, no nausea or vomiting.  He has also had intermittent ______ sensation in the neck over the past month.  He has had a history of 7 to 8 years of vague similar symptoms, but has none over the past month.  He has had previous cardiac history, he has had no previous myocardial infarction, no previous angioplasty, no previous surgery, was cathed 10 years ago.  Cardiac risk factors include hypertension, hyperlipidemia which has intermittently been treated, markedly positive family history for diabetes and coronary artery disease with his father dying at age 87 and mother died at age 94.  He has had a sister with CAB x 2.  He does have type 2 diabetes first diagnosed in 1993.  His most recent hemoglobin A1c was a little over 7, in the endocrinologists office in the past week  has started seeing Dr. Elyse Hsu.  He has no known renal insufficiency.  PAST MEDICAL HISTORY: 1. Chronic back pain with epidural injections. 2. History of "spot on the lung".  This has been followed by Dr. Mariea Clonts with    serial CT scans, chest x-rays and as far as reports indicate, has been no    change over 1-1/2 years. 3. Peripheral neuropathy. 4. History of kidney stones.  PAST SURGICAL HISTORY:  Left foot surgery, right rotator cuff, back surgery x 3, abdominal surgery secondary to necrotic tissue 10 or 15 years ago by Dr. Dalbert Batman, bilateral carpal tunnel and lithotripsy.  SOCIAL HISTORY:  The patient is in second marriage, has four children, lives with his wife.  He is a retired Development worker, community.  FAMILY HISTORY:  Positive for coronary artery disease and diabetes as noted above.  MEDICATIONS: 1. Clonidine 0.2 mg p.o. b.i.d. 2. One aspirin a day. 3. Cardizem CD 240 a day. 4. Glucotrol 5 mg a day. 5. Lozol 1.25 mg a day. 6. Glucophage 1000 mg b.i.d., currently on hold. 7. Lipitor 40 mg q.h.s., just recently started. 8. Lantus insulin 16 units q.h.s.  ALLERGIES:  No known drug allergies.  CARDIAC REVIEW OF SYSTEMS:  Positive for resting shortness of breath, exertional shortness of breath, palpitations, chest pain, he denies lower extremity edema, syncope, presyncope or orthopnea.  GENERAL REVIEW OF SYSTEMS:  The  patient is moderately obese, but notes that he has had some weight loss over the past year.  He denies any recurrent pulmonary infections or hemoptysis.  Gastrointestinal complaints:  The patient does note some increased indigestion recently, it is unclear if this is GI or cardiac symptoms.  Neurologic complaints:  None.  Musculoskeletal exam:  The patient complains of pain in the right shoulder and chronic low back pain and numbness along the left lateral ______ after his back surgery.  GU:  The patient notes impotence.  Infection:  No recent infections.  He was  recently immunized for flu and pneumococcal infections by Dr. Elyse Hsu.  Hematologic: The patient has had no bleeding diathesis or known anemia.  Endocrine has been recently evaluated by Dr. Elyse Hsu for his diabetes mellitus, which has been long-standing.  Denies any thyroid disease.  Denies any psychiatric history. Denies claudication with ambulation, though he does have numbness and tingling in both feet.  VITAL SIGNS:  Blood pressure 130/85, heart rate is 78 sinus, respiratory rate is 18 without distress, ______ saturations 95%.  In general, the patient is moderately obese, he is alert and oriented, and able to relate his history. Neck is without palpable masses or thyromegaly, there is no supraclavicular adenopathy.  He has no carotid bruits.  Breath sounds are present bilaterally without wheezing.  Cardiac exam reveals a regular rate and rhythm without murmur or gallop.  Abdominal exam shows a moderately obese abdomen without palpable masses or dilatation of the aorta, but due to his size, an aortic aneurysm could easily not be palpated.  Lower extremities:  Patient has palpable DP and PT pulses bilaterally, 1+ each.  He appears to have adequate vein in both lower extremities for bypass.  The skin in the lower extremities is without ischemic changes.  LABORATORY DATA:  On February 26th, his hematocrit was 41.5, hemoglobin 13.9, white count 7.6, PT/PTT were normal, no recent cholesterol was recorded. Sodium 138, potassium 4.6, BUN 18, creatinine 1.0, glucose of 98.  EKG shows normal sinus rhythm without acute changes.  Chest x-ray has a 3 mm subpleural nodule left upper lobe which has been followed on previous CT scans and chest x-ray.  Cardiac catheterization shows global hypokinesis with ejection fraction of 35%.  Patient has 40-50% left main, 90% proximal LAD with significant distal disease.  Circumflex is 90% distal lesion, which may not have enough vessel distal to the  lesion for bypass.  Right coronary artery is diffusely diseased with a 90% bifurcation of the PD and acute marginal.  The posterolateral  branches are small and diffusely disease and probably not bypassable. Overall, the patient has three-vessel coronary artery disease, but with very poor distal targets.  EKG not done at [facility], but one copy from Dr. Ezekiel Slocumb office shows left bundle branch block.  IMPRESSION: 1. Coronary occlusive disease with increasing anginal symptoms with exertion. 2. Left bundle branch block. 3. Type 3 diabetes with poor control. 4. Hypertensive cardiac disease. 5. Hyperlipidemia. 6. Osteoarthritis.  PLAN:  Because of the patients diabetes, three-vessel coronary artery disease and symptoms consistent with ischemic heart disease, coronary artery bypass grafting has been recommended with some limitations, discussed with the patient because of his significant distal disease.  The risks of the procedure, including death infection, stroke and myocardial infarction, bleeding, and blood transfusion have been discussed with the patient and his wife and daughter in detail.  Specifically talked about the risks of renal insufficiency and wound infection because of the diabetes.  The  patient has had his questions answered and is willing to proceed.  He is tentatively for discharge to home tomorrow with marked limitations in his activity including no driving and will return on Tuesday, March 5 to proceed with bypass surgery. He and his family were counseled that should he have any recurrent pain, to call 911 and immediately return to the hospital.  Patients previous hematocrit was 41, today it appears to be 33 without any source of blood loss. Will repeat this in the morning along with check of his creatinine. DD:  10/29/00 TD:  10/31/00 Job: [id number]

## 2011-01-16 NOTE — Discharge Summary (Signed)
[facility] K[facility]. [facility]  Patient:    Manuel Kline, Manuel Kline                          MRN: [id number] Adm. Date:  [id number] Disc. Date: 11/08/00 Attending:  Montine Circle Dictator:   Earnstine Regal, P.A. CC:         Kerry Hough., M.D.  Juanda Bond. Altheimer, M.D.  Robert A. Hurman Horn., M.D.   Referring Physician Discharge [place]  DATE OF BIRTH:  October 28, 2031  REFERRING PHYSICIANS: 1. W. Doristine Church., M.D. 2. Juanda Bond. Altheimer, M.D. 3. Audree Camel. Hurman Horn., M.D.  ADMISSION DIAGNOSES: 1. Angina with 40-50% left main, 90% left anterior descending, 90% left    circumflex, and right coronary artery with 90% stenosis at the posterior    descending/acute marginal bifurcation. 2. Adult onset diabetes mellitus, non-insulin dependent with poor control. 3. Hypertension. 4. Hyperlipidemia. 5. Obesity.  DISCHARGE DIAGNOSES: 1. Angina with 40-50% left main, 90% left anterior descending, 90% left    circumflex, and right coronary artery with 90% stenosis at the posterior    descending/acute marginal bifurcation. 2. Adult onset diabetes mellitus, non-insulin dependent with poor control. 3. Hypertension. 4. Hyperlipidemia. 5. Obesity. 6. Postoperative anemia.  PROCEDURES:  Coronary artery bypass grafting x 5, left internal mammary artery to the left anterior descending, saphenous vein graft to the acute marginal and posterolateral sequentially, saphenous vein graft to the distal OM1, and saphenous vein graft to the diagonal on November 02, 2000.  BRIEF HISTORY:  The patient is a 77 year old white male, a medical patient of W. Doristine Church., M.D., Juanda Bond. Altheimer, M.D., and Audree Camel. Hurman Horn., M.D., at [place].  The patient presented with several episodes of shortness of breath and chest discomfort, nonradiating, in stressful situation or when he became excited.  This included watching wrestling on TV.  He has diaphoresis  with these episode, but no nausea or vomiting.  He has also had intermittent discomfort in the neck over the past month.  He has a seven to eight-year history of vague symptoms prior to that. He underwent cardiac catheterization 10 years ago.  Cardiac risk factors included hypertension and hyperlipidemia and a positive family history.  He recently underwent study which showed severe three-vessel coronary artery disease and he was referred to CVTS and Edward B. Servando Snare, M.D., for further evaluation and treatment.  PAST MEDICAL HISTORY:  Chronic back pain with epidural injections.  He has a history of a spot on his lung followed by Herbie Baltimore A. Hurman Horn., M.D., with serial CT scans and chest scans, persistent neuropathy, and a history of kidney stones.  He also has adult onset diabetes mellitus, non-insulin dependent, poorly controlled.  PAST SURGICAL HISTORY:  He has also had left foot surgery, right rotator cuff and back surgery, abdominal surgery secondary to necrotic tissue 10-15 years ago by Edsel Petrin. Dalbert Batman, M.D., bilateral carpal tunnel procedures, and a lithotripsy.  SOCIAL HISTORY:  The patient is in his second marriage.  He has four children. He lives with his wife.  He is a retired Development worker, community.  FAMILY HISTORY:  Positive for coronary artery disease and diabetes.  MEDICATIONS ON ADMISSION: 1. Clonidine 0.2 mg p.o. b.i.d. 2. Aspirin 325 mg q.d. 3. Cardizem 240 mg q.d. 4. Glucotrol 5 mg one q.d. 5. Lozol 1.25 mg q.d. 6. Glucophage 1 g p.o. b.i.d. 7. Lipitor 40 mg p.o. q.h.s. 8. Lantus Insulin 16 units  subcu q.h.s.  For further history and physical, please see the dictated note.  HOSPITAL COURSE:  The patient has been evaluated as an outpatient by D. Marlyn Corporal, M.D.  It was his opinion that the patient was a good surgical candidate.  The risks and benefits were discussed in detail.  Informed operative consent was obtained.  The patient was admitted, taken to  the operating room, and underwent CABG x 5 as noted above.  He tolerated the procedure well and was transferred to the intensive care unit in satisfactory condition.  He was maintained postoperatively on an insulin drip.  He remained hemodynamically stable.  He was doing well and he was weaned from the ventilator.  On the first postoperative morning, the cardiac index was 1.9 and the glucose was 144-123.  The patients wounds looked good.  He was hemodynamically stable.  Previously he did have some concerns with his renal function which had shown that he had changed from 1.4 to a high of 2.7.  He also had trouble with his O2 saturations and required renal dose dopamine. His creatinine was up to 3 by November 04, 2000.  He showed steady improvement and was followed by Juanda Bond. Altheimer, M.D., for his sugars.  He made slow improvement and was ultimately transferred to the floor.  He underwent into phase I cardiac rehabilitation with good results, ambulating up to 500 feet with O2 saturations of 93.7 on November 06, 2000.  He continues to make good steady progress.  His wounds are healing nicely.  It was Dr. Tharon Aquas Trigts opinion that he will be ready for discharge in the a.m. of November 08, 2000.  At this point, his only postoperative complication has been postoperative anemia. His hemoglobin was 8.4 with a hematocrit of 24.6, a white count of 8.7, and platelets are 317,000.  Electrolytes are back to normal with a sodium of 136, a potassium of 3.8, a chloride of 100, a CO2 of 29, a BUN of 27, a creatinine of 1.2, and a glucose of 147.  The patient has been started on iron and Epogen.  If he continues to do well and has no other postoperative difficulties, we will plan to discharge him home in the a.m. of November 08, 2000.  DISCHARGE MEDICATIONS:  He will resume his preoperative medications, including  Glucophage 1 g p.o. b.i.d. a.c., Glucotrol 10 mg p.o. q.a.m. a.c., Lantus Insulin 16 units q.d.  h.s., Lozol 1.25 mg q.d., clonidine 0.2 mg q.d., Lipitor 40 mg q.d., and a multivitamin one q.d.  Niferex 150 mg q.d. is new.  Also new will be potassium 20 mEq q.d. x 10, Lasix 40 mg one q.d. x 10, Lopressor 50 mg p.o. b.i.d., Darvocet-N 100 one to two p.o. q.4h. p.r.n., Ecotrin 325 mg one q.d.  DISPOSITION:  The patient has done well postoperatively.  He had one brief episode of atrial fibrillation and has been treated with additional beta blockers.  He has remained in sinus rhythm outside of that one strip.  His wounds look good.  We plan discharge home in the a.m. with the above-listed medications.  FOLLOW-UP:  He will return in two weeks to see W. Tollie Eth, Brooke Bonito., M.D., and in two weeks to have his staples removed from his leg.  He will follow up with Percell Miller B. Servando Snare, M.D., in the office.  Our office will call and make those arrangements.  DISCHARGE ACTIVITY:  Light to moderate as instructed.  No lifting over 10 pounds.  No driving.  No strenuous activity.  DISCHARGE LABORATORY DATA:  Electrolytes are normal with a CO2 of 26, a BUN of 35, a creatinine of 1.4, and a glucose of 122 on November 06, 2000.  The hemoglobin is currently 8.4 with a hematocrit of 24.6 and platelets are 8403.  CONDITION ON DISCHARGE:  Improving. DD:  11/07/00 TD:  11/07/00 Job: [id number]

## 2011-01-16 NOTE — Op Note (Signed)
[place]. [facility]  Patient:    Manuel Kline, Manuel Kline                         MRN: [id number] Proc. Date: 11/02/00 Attending:  Lilia Argue. Servando Snare, M.D. CC:         Kerry Hough., M.D.   Operative Report  PREOPERATIVE DIAGNOSIS:  Coronary occlusive disease with unstable angina and diabetes mellitus.  POSTOPERATIVE DIAGNOSIS:  Coronary occlusive disease with unstable angina and diabetes mellitus.  PROCEDURE:  Coronary artery bypass grafting x 5 with the left internal mammary to the left anterior descending coronary artery, reversed saphenous vein graft to the diagonal coronary artery, reversed saphenous vein graft to the distal obtuse marginal coronary artery, and sequential reversed saphenous vein graft to the acute margin and distal right coronary artery.  SURGEON:  Lilia Argue. Servando Snare, M.D.  ASSISTANT:  Sheliah Hatch, P.A.  BRIEF HISTORY:  Patient is a 77 year old male with longstanding diabetes, who presented with increasing unstable anginal symptoms over the preceding month. The patient was seen by Dr. Tollie Eth for cardiac catheterization.  In addition, because of his poorly controlled diabetes, he was also referred to Dr. Juanda Bond. Altheimer for further evaluation.  Cardiac catheterization revealed significant 3-vessel coronary artery disease with distal disease.  He had a large first obtuse marginal coronary artery with a proximal and a very distal obstruction, had 50-60% left main, multiple 70-80% stenoses of the LAD, 90% stenosis of the diagonal, a 90% right coronary artery obstruction just prior to the takeoff of a large acute marginal supplying the inferior surface of the heart and diffuse distal disease throughout the distal right coronary artery system.  Overall ventricular function was moderately depressed. Because of the patients symptoms and severe 3-vessel disease, coronary artery bypass grafting was recommended.  The patient  agreed and signed informed consent.  DESCRIPTION OF PROCEDURE:  With Swan-Ganz and arterial line monitors in place, patient underwent general endotracheal anesthesia without incident.  Skin of the chest and legs was prepped with Betadine and draped in usual sterile manner.  Vein was harvested from the right lower extremity and was of adequate quality and caliber.  A median sternotomy was performed.  Left internal mammary artery was dissected down as a pedicle graft.  Distal artery was divided and had good free-flow.  The pericardium was opened.  Patient was systemically heparinized.  The ascending aorta and the right atrium were cannulated and he did have evidence of left ventricular hypertrophy.  The patient was systemically heparinized, ascending aorta and the right atrium were cannulated and aortic vent/cardioplegia needle was introduced into the ascending aorta.  Patient was placed on cardiopulmonary bypass at 2.4 L/min/m.sq.  Sites of anastomoses were selected and dissected out of the epicardium.  Patients body temperature was cooled to 30 degrees, aortic cross-clamp was applied and 500 cc of cold-blood potassium cardioplegia was administered.  Patients LAD and diagonal were both significantly buried in the epicardium, but were located and dissected out.  Aortic cross-clamp was applied and 500 cc of cold-blood potassium cardioplegia was administered with rapid diastolic arrest of the heart.  Myocardial septal temperature was monitored throughout the cross-clamp.  Attention was turned first to the very distal obtuse marginal, which was opened and was even in its distal portion was a reasonable size vessel, easily admitted a 1.5 mm probe. Using a running 7-0 Prolene distal anastomosis was performed.  Attention was then turned to the diagonal  coronary artery, which had been dissected out of the intramyocardial position.  The vessel was opened, was slightly small, but did admit a 1.5 mm  probe.  Using a running 7-0 Prolene, distal anastomosis was performed.  Attention was then turned to the acute marginal coronary artery, which was a 1.5 mm vessel that extended onto the inferior surface of the heart. Using a short natural Y, an end-to-side anastomosis was carried out. The very distal right coronary artery after the just at the takeoff of a small posterior descending vessel, the distal right coronary artery was opened and admitted a 1.5 mm probe distally into the posterolateral branch.  The distal extent of the vein graft to the acute marginal was then anastomosed with a running 7-0 Prolene. Additional cold-blood cardioplegia was administered. Attention was then turned to the left anterior descending coronary artery, which was opened between the mid and distal third of the vessel.  Using a running 8-0 Prolene, the left internal mammary artery was anastomosed to the left anterior descending coronary artery.  With release of Edwards bulldog on the mammary artery, there was appropriate rise in myocardial septal temperature.  Aortic cross-clamp was removed with a total cross-clamp time of 73 minutes.  Patient spontaneously converted to a sinus rhythm.  Partial occlusion clamp was placed on the ascending aorta.  Three punch aortotomies were performed.  Each of the three vein grafts were anastomosed to the ascending aorta.  Air was evacuated from the grafts and the partial occlusion clamp was removed.  Sites of anastomoses were inspected and free of bleeding. Patient was then ventilated and weaned from cardiopulmonary bypass without difficulty.  He remained hemodynamically stable.  He was decannulated in usual fashion.  Protamine sulfate was administered with operative field hemostatic. Two atrial and two ventricular pacing wires were applied.  Graft markers applied.  A left pleural tube and two mediastinal tubes were left in place. Sternum was closed with #6 stainless steel wire.   Fascia closed with interrupted 0 Vicryl, running 3-0 Vicryl in subcutaneous tissue, 4-0  subcuticular stitch in the skin edges.  Dry dressings were applied.  Sponge and needle count was reported as correct at completion of the procedure. Patient tolerated the procedure well without obvious complication and was transferred to surgical intensive care unit for further postoperative care. DD:  11/05/00 TD:  11/05/00 Job: [id number]

## 2011-01-16 NOTE — Discharge Summary (Signed)
[place]. [facility]  Patient:    [name], Manuel Kline                          MRN: [id number] Adm. Date:  [id number] Disc. Date: [id number] Attending:  Montine Circle Dictator:   Lestine Box, RNFA CC:         Kerry Hough., M.D.  Juanda Bond. Altheimer, M.D.  Robert A. Hurman Horn., M.D.   Discharge Summary  ADDENDUM   With his history of renal insufficiency, we will discontinue the Glucophage postoperatively.  He has also been instructed to follow up with Dr. Elyse Hsu, his primary care Manuel Kline, next week. DD:  11/08/00 TD:  11/09/00 Job: [id number]

## 2011-01-16 NOTE — Cardiovascular Report (Signed)
[place]. [facility]  Patient:    Manuel Kline, Manuel Kline                          MRN: [id number] Proc. Date: 10/29/00 Adm. Date:  [id number] Disc. Date: [id number] Attending:  Monico Blitz CC:         Audree Camel Hurman Horn., M.D.  Juanda Bond. Altheimer, M.D.   Cardiac Catheterization  PROCEDURE:  Cardiac catheterization.  CARDIOLOGIST:  Kerry Hough., M.D.  HISTORY:  A 77 year old male with recent progressive angina and a high-risk stress Cardiolite with transient cavity dilation and multiple perfusion defects.  COMMENTS ABOUT PROCEDURE:  The patient tolerated the procedure well without complications.  At the end of the procedure, he had good pulses present. Please see the attached catheterization log for the remainder of the details and catheters used.  HEMODYNAMIC DATA:  Aorta post contrast 130/80 LV post contrast 130/18.  ANGIOGRAPHIC DATA:  Left ventriculogram:  Performed in the 30 degree RAO projection.  The aortic valve was normal.  The mitral valve was normal.  The left ventricle was slightly dilated.  There appears to be mild to moderate diffuse hypokinesis of the LV with an estimated ejection fraction of around 40%.  Coronary arteries arise and distribute normally.  Heavy calcification is noted in the left main, proximal circumflex, LAD, and the mid and proximal right coronary arteries.  The left main coronary artery is calcified with an eccentric 40 to 50% proximal left main stenosis.  There is a 30% distal left main stenosis present.  The left anterior descending is heavily calcified proximally.  There is an eccentric segmental 90% stenosis in the proximal portion of the artery.  There is moderate diffuse disease up to a diagonal branch and an area of severe segmental disease estimated at 80 to 90% after the diagonal branch.  There is moderate distal disease involving the left anterior descending.  The circumflex coronary  artery gives off a very large obtuse marginal artery which has a severe 90% proximal stenosis.  There is a distal 90 to 95% stenosis in the vessel also.  Two small posterolateral branches are noted, and there is some collateral filling seen from the left coronary system.  These are moderately and diffusely diseased.  The right coronary artery is heavily calcified.  There is a proximal 60 to 70% stenosis in an area of moderate disease.  A large acute marginal branch supplies the posterior descending artery, and there is a severe 90% stenosis which involves the origin of this and a continuation branch of the right coronary artery.  There is a 70% segmental stenosis in a continuation branch prior to the posterior descending artery.  There is mild diffuse disease in the distal vessel.  IMPRESSION: 1. Moderate left main severe native three-vessel coronary atherosclerotic    heart disease with some distal disease. 2. Depressed left ventricular function with estimated ejection fraction of    40%.  RECOMMENDATIONS:  Coronary artery bypass grafting. DD:  10/29/00 TD:  10/31/00 Job: [id number]

## 2011-01-16 NOTE — H&P (Signed)
[place]. [facility]  Patient:    Manuel Kline, Manuel Kline Visit Number: [id number] MRN: [id number]          Service Type: MED Location: 2000 2033 01 Attending Physician:  Kerry Hough Md Dictated by:   Kerry Hough., M.D. Admit Date:  01/13/2002   CC:         Juanda Bond. Altheimer, M.D.             Robert A. Hurman Horn., M.D.   History and Physical  REASON FOR ADMISSION: Passed out.  HISTORY OF PRESENT ILLNESS: This 77 year old male has a prior history of coronary artery disease.  He had coronary diagnosed in March 2002 after dyspnea and a new left bundle branch block, and a strongly positive stress Cardiolite study.  Catheterization showed left main and three-vessel coronary artery disease and he underwent coronary artery bypass grafting by Dr. Ceasar Mons, with an internal mammary graft to the LAD, a vein graft to the diagonal, a vein graft to the OM, and a vein graft to the acute marginal and distal right coronary artery.  He had some mild renal insufficiency postoperatively.  He has been followed by Dr. Elyse Hsu but has been getting along fairly well.  He has not had any recurrent chest pain and had been feeling fine.  He was recently started on Actos by Dr. Elyse Hsu approximately one week ago for elevated sugars.  His sugars have been somewhat better this week.  He presented to the emergency room with a syncopal episode today that occurred this morning.  His son-in-law was in the room with him.  He had just turned and the patient had sudden onset of syncope.  He described a slight pain inhis head prior to the episode but no experience of tachy palpitations and no promontory symptoms.  The patient fell suddenly, striking his arm and his head.  Seizure activity was witnessed by the son-in-law as well as incontinence.  He regained consciousness after approximately two to three minutes.  He was blue and gray, and very diaphoretic.  EMS was  called and he was transported here.  A CT scan of the head showed no acute abnormality. EKG showed a left bundle branch block which was chronic.  He had no significant chest pain or mid sternal pain like he had previously.  He is admitted at this time to evaluate syncope.  PAST MEDICAL HISTORY:  1. Diabetes mellitus, type 2, with complications of retinopathy and mild     nephropathy.  He also had mild peripheral neuropathy.  2. Known coronary disease.  3. Long-standing hypertension.  4. Long-standing hyperlipidemia.  5. History of erosive esophagitis.  6. History of erectile dysfunction.  7. Osteoarthritis.  PAST SURGICAL HISTORY:  1. Renal stones.  2. Back surgery x3.  4. Rotator cuff surgery.  5.Carpal tunnel surgery x2.  6. Abdominal surgery with exploratory laparotomy.  6. Coronary artery bypass grafting.  ALLERGIES: None.  CURRENT MEDICATIONS:  1. Glucophage 1000 mg b.i.d.  2. Glucotrol XL 10 mg q.d.  3. Clonidine 0.1 mg b.i.d.  4. Aspirin q.d.  5. Lipitor 40 mg q.d.  6. Lantus insulin 40 units at bedtime.  7. Regularinsulin with meals.  8. Accupril 40 mg q.d.  9. Lasix 40 mg q.d. 10. Potassium 20 mEq q.d. 11. Toprol-XL 50 mg q.d.  FAMILY HISTORY: Recorded in previously dictated record from October 29, 2000 and is unchanged.  SOCIAL HISTORY: Formerly ran a Scientist, research (physical sciences), is  retired.  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  He is a nonsmoker and does not use alcohol to excess.  He has been married for years.  REVIEW OF SYSTEMS: Obese for years.  He has diabetic retinopathy.  Previously no ENT complaints.  History of multiple adenomas of the colon.  No history of hiatal hernia with esophagitis and negative biopsy for Helicobacter pylori in the past.  Significant impotence.  He has tried Viagra without success.  He has some numbness and pain involving toesattributed to peripheral neuropathy. He does recall a brief episode of syncope or  unsteadiness when he walked into the house following bypass grafting a year ago, but has had none since then. No significant dizziness. He notes no arrhythmia symptoms.  PHYSICAL EXAMINATION:  GENERAL: He is a pale, obese male, who is currently not in any acute distress.  VITAL SIGNS: Blood pressure 147/88 lying, pulse 70 and regular.  SKIN: Warm and dry.  There is a bruise and slight abrasion present on theleft forearm and a reddened area on the forehead.  HEENT: PERRLA.  C&S clear.  Diabetic retinopathy noted.  Pharynx negative.  NECK: Supple without masses, JVD, or thyromegaly.  LUNGS: Clear to A&P.  CARDIAC: Normal S1 and S2.  No S3 or murmur.  ABDOMEN: Soft, nontender.  No aneurysm noted.  GU/RECTAL: Deferred.  EXTREMITIES: Femoral and distal pulses 2+.  LABORATORY DATA: A 12 lead ECG shows his left bundle branch block.  IMPRESSION:  1. Syncope.  Rule out intermittent complete heart block or ventricular     arrhythmia in patient with known coronary artery disease.  Rule out     seizure disorder if these are normal.  2. Coronary disease with previous bypass grafting for three-vessel and     left main disease.  3. Type 2 diabetes with neuropathy, nephropathy, and retinopathy.  4. Hypertensive heart disease.  5. Dyslipidemia.  6. Metabolic syndrome.  7. History of erosive esophagitis.  8. Erectile dysfunction.  PLAN:  1. The patient will be admitted to the telemetry unit.  2. Dr. Elyse Hsu will be consulted.  3. Rule out MI with serial CPK and EKGs.  4. Likely will need telemetry monitoring and EP consultation.  5. Check electrocardiogram to evaluate LV function. Dictated by:   Kerry Hough., M.D. Attending Physician:  Kerry Hough Md DD:  01/13/02 TD:  01/15/02 Job: [id number]

## 2011-01-16 NOTE — Discharge Summary (Signed)
[place]. [facility]  Patient:    Manuel Kline, Manuel Kline Visit Number: [id number] MRN: [id number]          Service Type: MED Location: [id number] Attending Physician:  Monico Blitz Dictated by:   Kerry Hough., M.D. Admit Date:  01/13/2002 Discharge Date: 01/17/2002   CC:         Juanda Bond. Altheimer, M.D.  Robert A. Hurman Horn., M.D.  Champ Mungo. Lovena Le, M.D. [facility]   Discharge Summary  FINAL DIAGNOSES 1. Syncope, presumed arrhythmic.    a. Implantable defibrillator and dual chamber pacemaker implanted this       visit. 2. Type 2 diabetes with retinopathy and mild nephropathy, mild peripheral    neuropathy. 3. Coronary artery disease with previous bypass grafting and depressed left    ventricular function, ejection fraction approximately 30%. 4. Hypertensive heart disease. 5. Hyperlipidemia. 6. Erosive esophagitis. 7. Erectile dysfunction. 8. Osteoarthritis.  PROCEDURES PERFORMED 1. Echocardiogram. 2. Implantation of an implantable defibrillator and pacemaker.  CONSULTATIONS: Champ Mungo. Lovena Le, M.D. [facility].  HISTORY OF PRESENT ILLNESS: The patient is a 77 year old male who had a known history of coronary artery disease and had new left bundle branch block and dyspnea and had three-vessel left main disease. He had bypass grafting by Dr. Servando Snare. He had mild renal insufficiency postoperatively. He had been doing relatively well except for slightly elevated sugars. He had a syncopal episode the day of admission where his son-in-law was in the room with him. He had minimal premonitory symptoms prior to syncope. He described a slight pain in his head but no tachy palpitations or premonitory symptoms. He fell suddenly, striking his arm and head. Seizure activity as witnessed by the son-in-law as well as incontinence. He regained consciousness after two to three minutes and was blue and gray and diaphoretic. The EMS was called and he was  transported here. Please see the previously dictated history and physical for the remainder of the details.  HOSPITAL COURSE: Laboratory data on admission showed a hemoglobin of 12.2, hematocrit of 37.6.  His PT and PTT were normal. Glucose was 139, BUN 17 and creatinine was 1.  His CPK was 242 with normal MB noted.  An EKG showed a left bundle branch block.  The patient was monitored over the weekend and had no further brady arrhythmias or ventricular tachycardia. He was seen in consultation by Dr. Cristopher Peru. Dr. Lovena Le felt that he would need to have assessment of his LV function which was done using an echocardiogram showing an EF of approximately 30%, paradoxical septal motion and LVH. Because of his syncope, left bundle branch block and known coronary disease, he underwent implantation of a Guidant defibrillator, dual chamber, serial #[id number]. It was a [employer], model [id number]. The ICD leads were serial [id number], and a ventricular lead serial [id number], which were both Guidant leads. Threshold information was P amplitude was 3, R was 19.2, atrial impedance was 1440 ohms, ventricular impedance 737 ohms. Threshold was 0.9 volts at 1.6 MA. Ventricular threshold was 0.9 volts at 1 MA. He was tested following this and had a satisfactory defibrillation threshold by Dr. Lovena Le. His device was checked the next day and was in stable condition. He was discharged in improved condition.  He is to follow up with Dr. Lovena Le.  He was instructed not to drive until given the OK by Dr. Lovena Le.  DISCHARGE MEDICATIONS 1.  Glucophage 1000 b.i.d. 2.  Glucotrol XL 10 q.d. 3.  Clonidine  0.1 b.i.d. 4.  Aspirin q.d. 5.  Lipitor 40 q.d. 6.  Lantus insulin 40 q.h.s. 7.  Regular insulin with meals. 8.  Accupril 40 q.d. 9.  Lasix 40 q.d. 10. Potassium 20 q.d. 11. Toprol XL 50 q.d.  DISCHARGE INSTRUCTIONS: He is to walk daily and is to call if there are problems.  FOLLOW UP: He is to see  me in three to four weeks. Dictated by:   Kerry Hough., M.D. Attending Physician:  Monico Blitz DD:  01/17/02 TD:  01/18/02 Job: [id number]

## 2011-01-16 NOTE — H&P (Signed)
[place]  Patient:    Manuel Kline, Manuel Kline                         MRN: [id number] Adm. Date:  10/29/00 Attending:  Kerry Hough., M.D. CC:         Dierdre Forth, M.D.  Juanda Bond. Altheimer, M.D.   History and Physical  CHIEF COMPLAINT/HISTORY OF PRESENT ILLNESS: This patient is a 77 year old male with a prior history of coronary artery disease, who in 1992 had worsening chest pain and had cardiac catheterization at that time showing a 40-50% left anterior descending coronary artery, with minimal disease elsewhere.  He was last seen by Dr. Martinique in 1999 with a negative stress test but with reduced exercise tolerance.  He was diagnosed with diabetes and had complications of retinopathy as well as peripheral neuropathy.  At one time in the past he took lipid-lowering therapy but was no longer taking it when he was seen on October 12, 2000.  He developed mid sternal chest discomfort when he would breathe cold air when he would get out and walk.  He also complained of chest pressure when he would get upset or watch wrestling on television.  He had no PND, orthopnea, or edema.  He had no claudication or TIAs.  He was seen after a three year absence in our office on October 12, 2000 and an adenosine Cardiolite scan was performed which showed a very high risk scan with transient cavity dilatation, ischemia in the lateral wall, anteroapical segment, inferior wall compatible with ischemia.  Ejection fraction was only 29%.  PAST MEDICAL HISTORY:  1. Diabetes as noted above.  2. Known coronary disease.  3. Hyperlipidemia.  4. Long-standing hypertension.  PAST SURGICAL HISTORY:  1. Kidney stones.  2. Back surgery x 3.  3. Rotator cuff surgery.  4. Carpal tunnel surgery x 2.  5. Abdominal surgery with exploration and laparotomy.  ALLERGIES: No known drug allergies.  CURRENT MEDICATIONS:  1. Glucophage 1000 mg b.i.d.  2. Lozol 1.25 mg q.d.  3. Glucotrol XL 10 mg q.d.  4. Clonidine 0.2 mg q.d.  5. Cardizem CD 240 mg q.d.  6. Aspirin q.d.  7. Lipitor 40 mg q.h.s., just begun.  8. Recently begun on insulin Lantus 16 units at bedtime.  FAMILY HISTORY: Very positive for coronary artery disease.  His father died at the age of 31 of MI.  Mother died at the age of 46 of diabetes and MI.  Two brothers living, one brother died of severe heart failure, diabetes, and renal insufficiency.  A sister, Edwena Felty, died.  One sister, Lanier Clam, alive. Another brother had bypass grafting and previous angioplasty and stenting. The family history is very positive for diabetes, coronary disease, and hypertension.  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  He is in the [employer], [place] and has been president three times. He is a nonsmoker.  He does not use alcohol to excess.  He has been married for many years and has one son and three daughters who are healthy.  REVIEW OF SYSTEMS: He has been obese for years and has lost 40 pounds since January 1999.  He has diabetic retinopathy diagnosed by a male ophthalmologist; no visual complaints.  No ENT complaints.  No difficulty swallowing.  History of hiatal hernia.  History of multiple adenomas of the colon.  He has a history of known  hiatal hernia with esophagitis and negative biopsy for Helicobacter pylori in the past.  He has significant impotence and has seen Dr. Diona Fanti in the past and at one time was given prostaglandin injections, but he did not wish to continue taking these.  No edema.  Some numbness and pain involving toes, attributed to peripheral neuropathy.  The remainder of the Review Of Systems is unremarkable except as noted above.  PHYSICAL EXAMINATION:  GENERAL: He is an obese male.  VITAL SIGNS: Weight 192 pounds.  Blood pressure 140/74 sitting and standing. Pulse 82 and regular.  SKIN: Warm and dry without  lesions.  HEENT: EOMI.  PERRLA.  Conjunctivae and sclerae clear.  Fundi show arteriolar narrowing, some diabetic retinopathy noted.  Tympanic membranes unremarkable. Pharynx negative.  NECK: Supple, without masses, thyromegaly, JVD, or bruits.  LYMPH NODES: Normal.  LUNGS: Clear to auscultation and percussion.  CARDIAC: Normal S1 and S2, no S3 or S4 or murmur.  ABDOMEN: Obese, soft, nontender.  Midline scar.  No aneurysm noted.  No abdominal tenderness.  GU/RECTAL: Deferred due to cardiac condition.  EXTREMITIES: No cyanosis or edema.  Homans negative.  Femoral pulses 3+ without bruits.  Peripheral pulses 2+.  LABORATORY DATA: A 12 lead ECG shows left bundle branch block.  Chest x-ray done at [facility] previously shows a possible spot.  CBC is normal.  PT and PTT normal.  IMPRESSION:  1. Chest discomfort consistent with angina pectoris, with an abnormal     Cardiolite scan - which is high risk.  2. New left bundle branch block on electrocardiogram.  3. Prior history of mild to moderate coronary artery disease.  4. Type 2 diabetes, borderline control.  5. Hypertensive heart disease.  6. Dyslipidemia.  7. Osteoarthritis.  8. History of erosive esophagitis.  9. Erectile dysfunction.  PLAN: The patient is brought in at this time for same-day cardiac catheterization.  The procedure was discussed with the patient fully including risk of MI, death, or CVA, and he is agreeable and willing to proceed.  He has a very high risk Cardiolite scan and may need bypass grafting on this basis since he is diabetic. DD:  10/26/00 TD:  10/27/00 Job: [id number]

## 2011-07-31 ENCOUNTER — Encounter: Payer: Self-pay | Admitting: Cardiology

## 2011-07-31 DIAGNOSIS — Z95810 Presence of automatic (implantable) cardiac defibrillator: Secondary | ICD-10-CM | POA: Insufficient documentation

## 2011-07-31 DIAGNOSIS — I119 Hypertensive heart disease without heart failure: Secondary | ICD-10-CM | POA: Insufficient documentation

## 2011-07-31 DIAGNOSIS — I2510 Atherosclerotic heart disease of native coronary artery without angina pectoris: Secondary | ICD-10-CM | POA: Insufficient documentation

## 2011-07-31 HISTORY — DX: Hypertensive heart disease without heart failure: I11.9

## 2011-09-09 ENCOUNTER — Encounter: Payer: Self-pay | Admitting: Internal Medicine

## 2011-09-15 ENCOUNTER — Ambulatory Visit (INDEPENDENT_AMBULATORY_CARE_PROVIDER_SITE_OTHER): Payer: Medicare Other | Admitting: Internal Medicine

## 2011-09-15 ENCOUNTER — Encounter: Payer: Self-pay | Admitting: Internal Medicine

## 2011-09-15 DIAGNOSIS — I428 Other cardiomyopathies: Secondary | ICD-10-CM

## 2011-09-15 DIAGNOSIS — Z95810 Presence of automatic (implantable) cardiac defibrillator: Secondary | ICD-10-CM

## 2011-09-15 DIAGNOSIS — I5022 Chronic systolic (congestive) heart failure: Secondary | ICD-10-CM

## 2011-09-15 DIAGNOSIS — I2589 Other forms of chronic ischemic heart disease: Secondary | ICD-10-CM

## 2011-09-15 LAB — ICD DEVICE OBSERVATION
ATRIAL PACING ICD: 41.13 pct
BATTERY VOLTAGE: 3.1031 V
CHARGE TIME: 9.659 s
FVT: 0
PACEART VT: 0
RV LEAD IMPEDENCE ICD: 418 Ohm
TOT-0001: 1
TOT-0006: 20110103000000
TZAT-0001ATACH: 2
TZAT-0001ATACH: 3
TZAT-0001FASTVT: 1
TZAT-0001SLOWVT: 1
TZAT-0001SLOWVT: 2
TZAT-0002ATACH: NEGATIVE
TZAT-0002FASTVT: NEGATIVE
TZAT-0005SLOWVT: 88 pct
TZAT-0011SLOWVT: 10 ms
TZAT-0011SLOWVT: 10 ms
TZAT-0012ATACH: 150 ms
TZAT-0012ATACH: 150 ms
TZAT-0013SLOWVT: 3
TZAT-0013SLOWVT: 3
TZAT-0018ATACH: NEGATIVE
TZAT-0018ATACH: NEGATIVE
TZAT-0018SLOWVT: NEGATIVE
TZAT-0018SLOWVT: NEGATIVE
TZAT-0019ATACH: 6 V
TZAT-0019ATACH: 6 V
TZAT-0019FASTVT: 8 V
TZAT-0019SLOWVT: 8 V
TZAT-0020ATACH: 1.5 ms
TZAT-0020ATACH: 1.5 ms
TZON-0003SLOWVT: 350 ms
TZON-0003VSLOWVT: 450 ms
TZON-0004SLOWVT: 16
TZON-0005SLOWVT: 12
TZST-0001ATACH: 4
TZST-0001ATACH: 5
TZST-0001FASTVT: 2
TZST-0001FASTVT: 4
TZST-0001FASTVT: 6
TZST-0002ATACH: NEGATIVE
TZST-0002FASTVT: NEGATIVE
TZST-0002FASTVT: NEGATIVE
TZST-0002FASTVT: NEGATIVE
TZST-0003SLOWVT: 15 J
TZST-0003SLOWVT: 35 J
TZST-0003SLOWVT: 35 J
VENTRICULAR PACING ICD: 0.11 pct
VF: 0

## 2011-09-15 NOTE — Assessment & Plan Note (Signed)
His symptoms remain class II. He will continue his current medical therapy and maintain a low-sodium diet. In addition, I've asked the patient to lose weight

## 2011-09-15 NOTE — Assessment & Plan Note (Signed)
He denies anginal symptoms. He'll continue his current medical therapy. 

## 2011-09-15 NOTE — Assessment & Plan Note (Signed)
His device is working normally. We'll plan to recheck in several months. 

## 2011-09-15 NOTE — Progress Notes (Signed)
HPI [name] returns today for followup. He is a very pleasant 78 year old man with an ischemic cardiomyopathy, chronic systolic heart failure, ventricular tachycardia, status post ICD implantation. He also has diabetes and morbid obesity. The patient denies chest pain, shortness of breath, syncope, or peripheral edema. He recently underwent eye surgery. No Known Allergies   Current Outpatient Prescriptions  Medication Sig Dispense Refill  . aspirin 81 MG tablet Take 160 mg by mouth daily.      Marland Kitchen atorvastatin (LIPITOR) 40 MG tablet Take 40 mg by mouth daily.      . cloNIDine (CATAPRES) 0.1 MG tablet Take 0.1 mg by mouth 2 (two) times daily.      . furosemide (LASIX) 40 MG tablet Take 40 mg by mouth daily.      Marland Kitchen glipiZIDE (GLUCOTROL) 10 MG tablet Take 10 mg by mouth daily.      Marland Kitchen glucose blood (ACCU-CHEK AVIVA) test strip 1 each by Other route as needed. Use as instructed      . insulin glargine (LANTUS) 100 UNIT/ML injection Inject 80 Units into the skin at bedtime.      . metFORMIN (GLUCOPHAGE) 1000 MG tablet Take 500 mg by mouth 2 (two) times daily with a meal.      . metoprolol tartrate (LOPRESSOR) 25 MG tablet Take 25 mg by mouth 2 (two) times daily.      Marland Kitchen omeprazole (PRILOSEC) 20 MG capsule Take 20 mg by mouth daily.      . potassium chloride (KLOR-CON) 20 MEQ packet Take 20 mEq by mouth daily.      . quinapril (ACCUPRIL) 40 MG tablet Take 40 mg by mouth at bedtime.         Past Medical History  Diagnosis Date  . CAD (coronary artery disease)   . S/P CABG (coronary artery bypass graft) 11/02/2000  . Hypertensive heart disease without CHF 07/31/2011  . ICD dual chamber in situ   . Sleep apnea   . Morbid obesity   . Erosive esophagitis   . Cardiomyopathy   . Erectile dysfunction   . Metabolic syndrome   . Osteoarthritis   . Calcium oxalate renal stones     ROS:   All systems reviewed and negative except as noted in the HPI.   Past Surgical History  Procedure Date  .  Doppler echocardiography 2003  . Back surgery     X'3  . Rotator cuff surgery   . Carpal tunnel release     X2  . Abdominal exploration surgery   . Laparotomy      History reviewed. No pertinent family history.   History   Social History  . Marital Status: Married    Spouse Name: N/A    Number of Children: N/A  . Years of Education: N/A   Occupational History  . Retired from [employer]  . Smoking status: Never Smoker   . Smokeless tobacco: Not on file  . Alcohol Use: No  . Drug Use: Not on file  . Sexually Active: Not on file   Other Topics Concern  . Not on file   Social History Narrative  . No narrative on file     BP 114/60  Pulse 60  Ht 5\' 4"  (1.626 m)  Wt 112.401 kg (247 lb 12.8 oz)  BMI 42.53 kg/m2  Physical Exam:  Well appearing 78 year old man, morbidly obese, but in no acute distress. HEENT: Unremarkable Neck:  No JVD, no  thyromegally Lungs:  Clear with no wheezes, rales, or rhonchi. Well-healed ICD incision. HEART:  Regular rate rhythm, no murmurs, no rubs, no clicks Abd:  soft, positive bowel sounds, no organomegally, no rebound, no guarding Ext:  2 plus pulses, no edema, no cyanosis, no clubbing Skin:  No rashes no nodules Neuro:  CN II through XII intact, motor grossly intact  DEVICE  Normal device function.  See PaceArt for details.   Assess/Plan:

## 2011-12-13 ENCOUNTER — Other Ambulatory Visit: Payer: Self-pay | Admitting: Cardiology

## 2012-01-28 ENCOUNTER — Other Ambulatory Visit: Payer: Self-pay | Admitting: Cardiology

## 2012-02-19 ENCOUNTER — Other Ambulatory Visit: Payer: Self-pay | Admitting: Cardiology

## 2012-04-23 ENCOUNTER — Other Ambulatory Visit: Payer: Self-pay | Admitting: Cardiology

## 2012-05-19 ENCOUNTER — Encounter: Payer: Self-pay | Admitting: Gastroenterology

## 2012-05-23 ENCOUNTER — Encounter: Payer: Self-pay | Admitting: Gastroenterology

## 2012-06-16 ENCOUNTER — Encounter: Payer: Self-pay | Admitting: Gastroenterology

## 2012-06-16 ENCOUNTER — Ambulatory Visit (INDEPENDENT_AMBULATORY_CARE_PROVIDER_SITE_OTHER): Payer: Medicare Other | Admitting: Gastroenterology

## 2012-06-16 VITALS — BP 106/60 | HR 68 | Ht 65.5 in | Wt 248.2 lb

## 2012-06-16 DIAGNOSIS — Z8719 Personal history of other diseases of the digestive system: Secondary | ICD-10-CM

## 2012-06-16 DIAGNOSIS — Z8679 Personal history of other diseases of the circulatory system: Secondary | ICD-10-CM

## 2012-06-16 DIAGNOSIS — Z87898 Personal history of other specified conditions: Secondary | ICD-10-CM

## 2012-06-16 DIAGNOSIS — E119 Type 2 diabetes mellitus without complications: Secondary | ICD-10-CM

## 2012-06-16 DIAGNOSIS — Z8669 Personal history of other diseases of the nervous system and sense organs: Secondary | ICD-10-CM

## 2012-06-16 DIAGNOSIS — Z951 Presence of aortocoronary bypass graft: Secondary | ICD-10-CM

## 2012-06-16 DIAGNOSIS — Z86010 Personal history of colonic polyps: Secondary | ICD-10-CM

## 2012-06-16 DIAGNOSIS — Z95810 Presence of automatic (implantable) cardiac defibrillator: Secondary | ICD-10-CM

## 2012-06-16 MED ORDER — NA SULFATE-K SULFATE-MG SULF 17.5-3.13-1.6 GM/177ML PO SOLN: 1.0000 | Freq: Once | ORAL | Status: DC

## 2012-06-16 NOTE — Progress Notes (Signed)
History of Present Illness:  This is a very pleasant 78 year old Caucasian male who has a long history of recurrent multiple colon polyps. Last exam was 5 years ago with removal of adenomatous. He currently is asymptomatic in terms of any GI complaints except for chronic acid reflux managed with every other day Prilosec 20 mg. He denies any lower GI or hepatobiliary complaints, specifically melena or rectal bleeding. His appetite is good and his weight is stable. He has multiple other medical problems including previous coronary artery bypass surgery in 2000, implantation of a defibrillator, and hypertensive cardiovascular disease on multiple medications. The patient also has had onset of insulin-dependent diabetes, he also takes oral metformin. He relates his exercise tolerance is good, and he denies recurrent symptoms of angina or congestive heart failure. Is not on anticoagulants except for an 81 mg aspirin a day. Family history is noncontributory. None of his siblings have had colon polyps or cancer. He is followed closely in cardiology by Dr. Crissie Sickles. The patient's wife was with him during the interview and exam. This patient also has had previous exploratory surgery by Dr. Dalbert Batman with resection of some mesenteric panniculitis.  I have reviewed this patient's present history, medical and surgical past history, allergies and medications.     ROS: The remainder of the 10 point ROS is negative... history of sleep apnea but he does not use his CPAP machine. He relates his defibrillator has never fired. In the past he does suffer from kidney stones, and partial deafness. He exercises with his wife 3-4 times a week.    Physical Exam: Healthy-appearing patient in no acute distress. Blood pressure 106/60, pulse 60 and regular, and weight 248 pounds with a BMI of 40.68. General well developed well nourished patient in no acute distress, appearing their stated age Eyes PERRLA, no icterus, fundoscopic exam  per opthamologist Skin no lesions noted Neck supple, no adenopathy, no thyroid enlargement, no tenderness Chest clear to percussion and auscultation Heart no significant murmurs, gallops or rubs noted Abdomen no hepatosplenomegaly masses or tenderness, BS normal. Multiple well-healed surgical scars noted.. Extremities no acute joint lesions, edema, phlebitis or evidence of cellulitis. Neurologic patient oriented x 3, cranial nerves intact, no focal neurologic deficits noted. Psychological mental status normal and normal affect.  Assessment and plan: This patient has multiple comorbid medical problems which appear to be under good control at this time. Because of his frequency of significant sized colon adenomas, I think we need to proceed with colonoscopy with nurse anesthesia and propofol sedation. We will make adjustments in his diabetic medications for this procedure because of the laxative prep he will need. Hopefully if he has small polyps we can remove these with cold snare technique. Patient also has had chronic GERD, and review of his record shows previous severe esophagitis with endoscopy greater than 15 years ago. I've increased his Prilosec to 20 mg a day, reviewed a reflux regime with him, and we will perform endoscopy at the time he is sedated for his colonoscopy. This maneuvers to exclude Barrett's mucosa or other inflammatory upper GI problems.  Please send to Dr. Milda Smart Althimer and Dr, Edrick Oh  Encounter Diagnosis  Name Primary?  . History of colon polyps Yes

## 2012-06-16 NOTE — Patient Instructions (Addendum)
You have been scheduled for a colonoscopy with propofol. Please follow written instructions given to you at your visit today.  Please pick up your prep kit at the pharmacy within the next 1-3 days. If you use inhalers (even only as needed), please bring them with you on the day of your procedure. CC:  Micheal Likens

## 2012-06-17 ENCOUNTER — Telehealth: Payer: Self-pay | Admitting: Gastroenterology

## 2012-06-17 ENCOUNTER — Telehealth: Payer: Self-pay

## 2012-06-17 NOTE — Telephone Encounter (Signed)
Pharmacy called and states that the Suprep is to expensive, the prep was changed to [facility] and instructions for movi have been faxed to the pharmacy [phone]

## 2012-06-17 NOTE — Telephone Encounter (Signed)
Free movi voucher called to the pharmacy granddaughter is aware

## 2012-06-20 ENCOUNTER — Encounter (HOSPITAL_COMMUNITY)
Admission: RE | Disposition: A | Discharge status: Home / Self Care | Payer: Self-pay | Source: Ambulatory Visit | Attending: Gastroenterology

## 2012-06-20 ENCOUNTER — Other Ambulatory Visit: Payer: Self-pay

## 2012-06-20 ENCOUNTER — Encounter (HOSPITAL_COMMUNITY): Payer: Self-pay

## 2012-06-20 ENCOUNTER — Telehealth: Payer: Self-pay | Admitting: *Deleted

## 2012-06-20 ENCOUNTER — Ambulatory Visit (HOSPITAL_COMMUNITY)
Admission: RE | Admit: 2012-06-20 | Discharge: 2012-06-20 | Disposition: A | Discharge status: Home / Self Care | Payer: Medicare Other | Source: Ambulatory Visit | Attending: Gastroenterology | Admitting: Gastroenterology

## 2012-06-20 ENCOUNTER — Encounter: Payer: Medicare Other | Admitting: Gastroenterology

## 2012-06-20 DIAGNOSIS — G4730 Sleep apnea, unspecified: Secondary | ICD-10-CM | POA: Insufficient documentation

## 2012-06-20 DIAGNOSIS — I428 Other cardiomyopathies: Secondary | ICD-10-CM

## 2012-06-20 DIAGNOSIS — E119 Type 2 diabetes mellitus without complications: Secondary | ICD-10-CM | POA: Insufficient documentation

## 2012-06-20 DIAGNOSIS — D126 Benign neoplasm of colon, unspecified: Secondary | ICD-10-CM | POA: Insufficient documentation

## 2012-06-20 DIAGNOSIS — K219 Gastro-esophageal reflux disease without esophagitis: Secondary | ICD-10-CM

## 2012-06-20 DIAGNOSIS — I119 Hypertensive heart disease without heart failure: Secondary | ICD-10-CM | POA: Insufficient documentation

## 2012-06-20 DIAGNOSIS — Z95810 Presence of automatic (implantable) cardiac defibrillator: Secondary | ICD-10-CM | POA: Insufficient documentation

## 2012-06-20 DIAGNOSIS — Z79899 Other long term (current) drug therapy: Secondary | ICD-10-CM | POA: Insufficient documentation

## 2012-06-20 DIAGNOSIS — Z7982 Long term (current) use of aspirin: Secondary | ICD-10-CM | POA: Insufficient documentation

## 2012-06-20 DIAGNOSIS — Z1211 Encounter for screening for malignant neoplasm of colon: Secondary | ICD-10-CM

## 2012-06-20 DIAGNOSIS — Z951 Presence of aortocoronary bypass graft: Secondary | ICD-10-CM | POA: Insufficient documentation

## 2012-06-20 DIAGNOSIS — Z09 Encounter for follow-up examination after completed treatment for conditions other than malignant neoplasm: Secondary | ICD-10-CM | POA: Insufficient documentation

## 2012-06-20 HISTORY — PX: COLONOSCOPY: SHX5424

## 2012-06-20 HISTORY — PX: ESOPHAGOGASTRODUODENOSCOPY: SHX5428

## 2012-06-20 HISTORY — DX: Presence of automatic (implantable) cardiac defibrillator: Z95.810

## 2012-06-20 LAB — GLUCOSE, CAPILLARY: Glucose-Capillary: 173 mg/dL — ABNORMAL HIGH (ref 70–99)

## 2012-06-20 SURGERY — EGD (ESOPHAGOGASTRODUODENOSCOPY)
Anesthesia: Moderate Sedation

## 2012-06-20 MED ORDER — MIDAZOLAM HCL 10 MG/2ML IJ SOLN
INTRAMUSCULAR | Status: DC | PRN
  Administered 2012-06-20 (×3): 2 mg via INTRAVENOUS

## 2012-06-20 MED ORDER — FENTANYL CITRATE 0.05 MG/ML IJ SOLN
INTRAMUSCULAR | Status: DC | PRN
  Administered 2012-06-20 (×3): 25 ug via INTRAVENOUS

## 2012-06-20 MED ORDER — FENTANYL CITRATE 0.05 MG/ML IJ SOLN
INTRAMUSCULAR | Status: AC
  Filled 2012-06-20: qty 4

## 2012-06-20 MED ORDER — SODIUM CHLORIDE 0.9 % IV SOLN
INTRAVENOUS | Status: DC
  Administered 2012-06-20: 15:00:00 via INTRAVENOUS

## 2012-06-20 MED ORDER — MIDAZOLAM HCL 10 MG/2ML IJ SOLN
INTRAMUSCULAR | Status: AC
  Filled 2012-06-20: qty 4

## 2012-06-20 NOTE — H&P (Signed)
Manuel Feil, MD Physician Signed  Progress Notes 06/16/2012 9:52 AM  Related encounter: Office Visit from 06/16/2012 in [facility]   History of Present Illness: This is a very pleasant 78 year old Caucasian male who has a long history of recurrent multiple colon polyps. Last exam was 5 years ago with removal of adenomatous. He currently is asymptomatic in terms of any GI complaints except for chronic acid reflux managed with every other day Prilosec 20 mg. He denies any lower GI or hepatobiliary complaints, specifically melena or rectal bleeding. His appetite is good and his weight is stable. He has multiple other medical problems including previous coronary artery bypass surgery in 2000, implantation of a defibrillator, and hypertensive cardiovascular disease on multiple medications. The patient also has had onset of insulin-dependent diabetes, he also takes oral metformin. He relates his exercise tolerance is good, and he denies recurrent symptoms of angina or congestive heart failure. Is not on anticoagulants except for an 81 mg aspirin a day. Family history is noncontributory. None of his siblings have had colon polyps or cancer. He is followed closely in cardiology by Dr. Crissie Sickles. The patient's wife was with him during the interview and exam. This patient also has had previous exploratory surgery by Dr. Dalbert Batman with resection of some mesenteric panniculitis.  I have reviewed this patient's present history, medical and surgical past history, allergies and medications.  ROS:  The remainder of the 10 point ROS is negative... history of sleep apnea but he does not use his CPAP machine. He relates his defibrillator has never fired. In the past he does suffer from kidney stones, and partial deafness. He exercises with his wife 3-4 times a week.  Physical Exam: Healthy-appearing patient in no acute distress. Blood pressure 106/60, pulse 60 and regular, and weight 248 pounds  with a BMI of 40.68.  General well developed well nourished patient in no acute distress, appearing their stated age  Eyes PERRLA, no icterus, fundoscopic exam per opthamologist  Skin no lesions noted  Neck supple, no adenopathy, no thyroid enlargement, no tenderness  Chest clear to percussion and auscultation  Heart no significant murmurs, gallops or rubs noted  Abdomen no hepatosplenomegaly masses or tenderness, BS normal. Multiple well-healed surgical scars noted..  Extremities no acute joint lesions, edema, phlebitis or evidence of cellulitis.  Neurologic patient oriented x 3, cranial nerves intact, no focal neurologic deficits noted.  Psychological mental status normal and normal affect.  Assessment and plan: This patient has multiple comorbid medical problems which appear to be under good control at this time. Because of his frequency of significant sized colon adenomas, I think we need to proceed with colonoscopy with nurse anesthesia and propofol sedation. We will make adjustments in his diabetic medications for this procedure because of the laxative prep he will need. Hopefully if he has small polyps we can remove these with cold snare technique. Patient also has had chronic GERD, and review of his record shows previous severe esophagitis with endoscopy greater than 15 years ago. I've increased his Prilosec to 20 mg a day, reviewed a reflux regime with him, and we will perform endoscopy at the time he is sedated for his colonoscopy. This maneuvers to exclude Barrett's mucosa or other inflammatory upper GI problems.  Please send to Dr. Milda Smart Althimer and Dr, Edrick Oh    Encounter Diagnosis    Name  Primary?    .  History of colon polyps  Yes

## 2012-06-20 NOTE — Telephone Encounter (Signed)
Message copied by Lance Morin on Mon Jun 20, 2012 10:34 AM ------      Message from: Sable Feil      Created: Mon Jun 20, 2012  8:22 AM      Regarding: RE: ASA IV Patient       Try to schedule the other day I am in [facility]      ----- Message -----         From: Osvaldo Angst, CRNA         Sent: 06/18/2012   5:11 PM           To: Anastasia Pall, Gean Birchwood, RN, #      Subject: ASA IV Patient                                           Dr. Sharlett Iles,            This patient with severe cardiomyopathy and  AICD with EF <35% has been scheduled for a procedure at [facility].  Unfortunately, as per accepted guidelines, he will need to have his procedure performed in a hospital setting.            Thank you for your consideration and understanding in this matter.            With best regards,            Bernie Covey, MS

## 2012-06-20 NOTE — Op Note (Signed)
[facility] [place], [address]   COLONOSCOPY PROCEDURE REPORT  PATIENT: Manuel Kline, Manuel Kline  MR#: [id number] BIRTHDATE: 10/29/1931 , 28  yrs. old GENDER: Male ENDOSCOPIST: Sable Feil, MD, [facility] REFERRED BY: PROCEDURE DATE:  06/20/2012 PROCEDURE:   Colonoscopy, diagnostic ASA CLASS:   Class IV INDICATIONS:patient's personal history of adenomatous colon polyps.  MEDICATIONS: Fentanyl 75 mcg IV and Versed 6 mg IV  DESCRIPTION OF PROCEDURE:   After the risks and benefits and of the procedure were explained, informed consent was obtained.  A digital rectal exam revealed no abnormalities of the rectum.    The Pentax Colonoscope [id number]  endoscope was introduced through the anus and advanced to the cecum, which was identified by both the appendix and ileocecal valve .  The quality of the prep was adequate, using MoviPrep .  The instrument was then slowly withdrawn as the colon was fully examined.     COLON FINDINGS: A normal appearing cecum, ileocecal valve, and appendiceal orifice were identified.  The ascending, hepatic flexure, transverse, splenic flexure, descending, sigmoid colon and rectum appeared unremarkable.  No polyps or cancers were seen. There were small 2 mm right colon nodules not biopsied,diminutive polyps,patient ASA4.    Retroflexed views revealed no abnormalities.     The scope was then withdrawn from the patient and the procedure completed.  COMPLICATIONS: There were no complications. ENDOSCOPIC IMPRESSION: Normal colon,small right colon polyps not removed.,felt to be incidental finding in chronically ill patient.  RECOMMENDATIONS: 1.  Continue current medications 2.  Given your age, you will not need another colonoscopy for colon cancer screening or polyp surveillance.  These types of tests usually stop around the age 80.   REPEAT EXAM:  [id number] Wynonia Lawman, MD, Lorne Skeens, MD, and Dione Housekeeper,  MD   _______________________________ eSigned:  Sable Feil, MD, [facility] 06/20/2012 4:50 PM

## 2012-06-20 NOTE — Telephone Encounter (Signed)
This patient is category 3 but does not meet any of the ASA category for criteria listed. He also does not have any head or neck deformity, excessive BMI, or difficult airway intubation. I will do him and endoscopy Center as planned. I disagree with John Nulty's assessment that he is not a candidate for [facility]. I've seen him in the office within the last few days and deemed him an appropriate candidate.

## 2012-06-20 NOTE — Telephone Encounter (Signed)
Noted  

## 2012-06-20 NOTE — Op Note (Signed)
[facility] [place], [address]   ENDOSCOPY PROCEDURE REPORT  PATIENT: Manuel, Kline  MR#: [id number] BIRTHDATE: April 26, 1932 , 89  yrs. old GENDER: Male ENDOSCOPIST:David Consuello Masse, MD, [facility] REFERRED BY: PROCEDURE DATE:  06/20/2012 PROCEDURE:   EGD, diagnostic ASA CLASS:    Class IV INDICATIONS: history of esophageal reflux. MEDICATION: There was residual sedation effect present from prior procedure. TOPICAL ANESTHETIC:  DESCRIPTION OF PROCEDURE:   After the risks and benefits of the procedure were explained, informed consent was obtained.  The Pentax Gastroscope [id number]  endoscope was introduced through the mouth  and advanced to the second portion of the duodenum .  The instrument was slowly withdrawn as the mucosa was fully examined.    The upper, middle and distal third of the esophagus were carefully inspected and no abnormalities were noted.  The z-line was well seen at the GEJ.  The endoscope was pushed into the fundus which was normal including a retroflexed view.  The antrum, gastric body, first and second part of the duodenum were unremarkable. Retroflexed views revealed a hiatal hernia.    The scope was then withdrawn from the patient and the procedure completed.  COMPLICATIONS: There were no complications.   ENDOSCOPIC IMPRESSION:Patient with GERD on ppi rx. Normal EGD  RECOMMENDATIONS: 1.  continue PPI 2.  continue current medications    _______________________________ eSigned:  Sable Feil, MD, [facility] 06/20/2012 4:38 PM

## 2012-06-20 NOTE — Telephone Encounter (Signed)
Pt states his prep has gone well. Informed him d/t our new guidelines, we cannot do him here, but Dr Sharlett Iles can do him today at 3pm at [facility], but he needs to be there at [place] stated he has had them here before, but I stated it's d/t his Defibrillator. Gave his directions and he stated understanding.

## 2012-06-21 ENCOUNTER — Encounter (HOSPITAL_COMMUNITY): Payer: Self-pay | Admitting: Gastroenterology

## 2012-07-01 ENCOUNTER — Ambulatory Visit (INDEPENDENT_AMBULATORY_CARE_PROVIDER_SITE_OTHER): Payer: Medicare Other | Admitting: General Surgery

## 2012-09-27 ENCOUNTER — Ambulatory Visit (INDEPENDENT_AMBULATORY_CARE_PROVIDER_SITE_OTHER): Payer: Medicare Other | Admitting: Internal Medicine

## 2012-09-27 ENCOUNTER — Encounter: Payer: Self-pay | Admitting: Internal Medicine

## 2012-09-27 VITALS — BP 136/76 | HR 60 | Ht 66.0 in | Wt 249.4 lb

## 2012-09-27 DIAGNOSIS — I5022 Chronic systolic (congestive) heart failure: Secondary | ICD-10-CM

## 2012-09-27 DIAGNOSIS — I428 Other cardiomyopathies: Secondary | ICD-10-CM

## 2012-09-27 DIAGNOSIS — Z95810 Presence of automatic (implantable) cardiac defibrillator: Secondary | ICD-10-CM

## 2012-09-27 LAB — ICD DEVICE OBSERVATION
BAMS-0001: 170 {beats}/min
BATTERY VOLTAGE: 3.06 V
FVT: 0
MODE SWITCH EPISODES: 0
PACEART VT: 0
RV LEAD THRESHOLD: 1 V
TZAT-0001ATACH: 1
TZAT-0001ATACH: 3
TZAT-0001FASTVT: 1
TZAT-0002ATACH: NEGATIVE
TZAT-0004SLOWVT: 8
TZAT-0004SLOWVT: 8
TZAT-0011SLOWVT: 10 ms
TZAT-0011SLOWVT: 10 ms
TZAT-0012ATACH: 150 ms
TZAT-0012ATACH: 150 ms
TZAT-0012ATACH: 150 ms
TZAT-0012SLOWVT: 200 ms
TZAT-0012SLOWVT: 200 ms
TZAT-0013SLOWVT: 3
TZAT-0013SLOWVT: 3
TZAT-0018ATACH: NEGATIVE
TZAT-0019SLOWVT: 8 V
TZAT-0020ATACH: 1.5 ms
TZAT-0020ATACH: 1.5 ms
TZAT-0020ATACH: 1.5 ms
TZAT-0020FASTVT: 1.5 ms
TZAT-0020SLOWVT: 1.5 ms
TZAT-0020SLOWVT: 1.5 ms
TZON-0003ATACH: 350 ms
TZON-0003SLOWVT: 350 ms
TZON-0003VSLOWVT: 450 ms
TZON-0004SLOWVT: 16
TZST-0001ATACH: 4
TZST-0001FASTVT: 2
TZST-0001FASTVT: 4
TZST-0001SLOWVT: 4
TZST-0001SLOWVT: 6
TZST-0002ATACH: NEGATIVE
TZST-0002ATACH: NEGATIVE
TZST-0002FASTVT: NEGATIVE
TZST-0002FASTVT: NEGATIVE
TZST-0003SLOWVT: 15 J
TZST-0003SLOWVT: 25 J
TZST-0003SLOWVT: 35 J
VF: 0

## 2012-09-27 NOTE — Assessment & Plan Note (Signed)
His heart failure symptoms remain class II despite his left ventricular dysfunction and massive obesity. I've encouraged the patient to lose weight, maintain a low-sodium diet, and continue his current medical therapy.

## 2012-09-27 NOTE — Patient Instructions (Signed)
Your physician wants you to follow-up in: 12 months with Dr. Taylor. You will receive a reminder letter in the mail two months in advance. If you don't receive a letter, please call our office to schedule the follow-up appointment.    

## 2012-09-27 NOTE — Progress Notes (Signed)
HPI Manuel Kline returns today for followup. He is a very pleasant 79 year old man with an ischemic cardiomyopathy, chronic systolic heart failure, diabetes, morbid obesity, hypertension, status post ICD implantation. In the interim, despite his multiple medical problems, he has remained stable. The patient denies chest pain, shortness of breath, or syncope. No Known Allergies   Current Outpatient Prescriptions  Medication Sig Dispense Refill  . allopurinol (ZYLOPRIM) 100 MG tablet Take 100 mg by mouth 2 (two) times daily.      Marland Kitchen aspirin 81 MG tablet Take 160 mg by mouth daily.      Marland Kitchen atorvastatin (LIPITOR) 40 MG tablet Take 40 mg by mouth daily.      . cloNIDine (CATAPRES) 0.1 MG tablet Take 0.1 mg by mouth 2 (two) times daily.      . furosemide (LASIX) 40 MG tablet Take 40 mg by mouth daily.      Marland Kitchen glipiZIDE (GLUCOTROL) 10 MG tablet Take 10 mg by mouth daily.      Marland Kitchen glucose blood (ACCU-CHEK AVIVA) test strip 1 each by Other route as needed. Use as instructed      . insulin aspart (NOVOLOG) 100 UNIT/ML injection Inject into the skin as directed.      . insulin glargine (LANTUS SOLOSTAR) 100 UNIT/ML injection Inject 80 Units into the skin at bedtime. Took 40 last night      . KLOR-CON M20 20 MEQ tablet       . metFORMIN (GLUCOPHAGE) 1000 MG tablet Take 500 mg by mouth 2 (two) times daily with a meal.      . metoprolol tartrate (LOPRESSOR) 25 MG tablet Take 25 mg by mouth 2 (two) times daily.      . Multiple Vitamin (MULTIVITAMIN) tablet Take 1 tablet by mouth daily. Eye formula      . Na Sulfate-K Sulfate-Mg Sulf SOLN Take 1 kit by mouth once.  354 mL  0  . Omega-3 Fatty Acids (FISH OIL) 1000 MG CPDR Take 1 tablet by mouth daily.      Marland Kitchen omeprazole (PRILOSEC) 20 MG capsule Take 20 mg by mouth daily.      . potassium chloride (KLOR-CON) 20 MEQ packet Take 20 mEq by mouth daily.      . quinapril (ACCUPRIL) 40 MG tablet Take 40 mg by mouth at bedtime.         Past Medical History  Diagnosis  Date  . CAD (coronary artery disease)   . S/P CABG (coronary artery bypass graft) 11/02/2000  . Hypertensive heart disease without CHF 07/31/2011  . ICD dual chamber in situ   . Sleep apnea   . Morbid obesity   . Erosive esophagitis   . Cardiomyopathy   . Erectile dysfunction   . Metabolic syndrome   . Osteoarthritis   . Calcium oxalate renal stones   . Adenomatous colon polyp 2006  . Hemorrhoids   . Diabetes   . HTN (hypertension)   . HLD (hyperlipidemia)   . ICD (implantable cardiac defibrillator) in place     ROS:   All systems reviewed and negative except as noted in the HPI.   Past Surgical History  Procedure Date  . Doppler echocardiography 2003  . Back surgery     X'3  . Rotator cuff surgery     left  . Carpal tunnel release     X2, bilateral  . Abdominal exploration surgery   . Laparotomy   . Cardiac bypass   . Cardiac defibrillator placement   .  Esophagogastroduodenoscopy 06/20/2012    Procedure: ESOPHAGOGASTRODUODENOSCOPY (EGD);  Surgeon: Sable Feil, MD;  Location: [facility];  Service: Endoscopy;  Laterality: N/A;  . Colonoscopy 06/20/2012    Procedure: COLONOSCOPY;  Surgeon: Sable Feil, MD;  Location: [facility];  Service: Endoscopy;  Laterality: N/A;     Family History  Problem Relation Age of Onset  . Diabetes Brother   . Heart disease Father   . Heart disease Mother   . Throat cancer Paternal Uncle      History   Social History  . Marital Status: Married    Spouse Name: N/A    Number of Children: 4  . Years of Education: N/A   Occupational History  . Retired from [employer]  . Smoking status: Never Smoker   . Smokeless tobacco: Never Used  . Alcohol Use: No  . Drug Use: No  . Sexually Active: Not on file   Other Topics Concern  . Not on file   Social History Narrative  . No narrative on file     BP 136/76  Pulse 60  Ht 5\' 6"  (1.676 m)  Wt 249 lb 6.4 oz (113.127 kg)   BMI 40.25 kg/m2  Physical Exam:  Morbidly obese appearing 79 year old man, NAD HEENT: Unremarkable Neck:  7 cm  JVD, no thyromegally Lymphatics:  No adenopathy Back:  No CVA tenderness Lungs:  Clear HEART:  Regular rate rhythm, no murmurs, no rubs, no clicks Abd:  soft,  obese, positive bowel sounds, no organomegally, no rebound, no guarding Ext:  2 plus pulses, no edema, no cyanosis, no clubbing Skin:  No rashes no nodules Neuro:  CN II through XII intact, motor grossly intact  EKG normal sinus rhythm with atrial pacing and left bundle branch block   DEVICE  Normal device function.  See PaceArt for details.   Assess/Plan:

## 2012-09-27 NOTE — Assessment & Plan Note (Signed)
His Medtronic dual-chamber ICD is working normally. His fluid index is well controlled.

## 2012-12-26 ENCOUNTER — Encounter: Payer: Medicare Other | Admitting: *Deleted

## 2013-01-09 ENCOUNTER — Encounter: Payer: Self-pay | Admitting: *Deleted

## 2013-09-28 ENCOUNTER — Ambulatory Visit (INDEPENDENT_AMBULATORY_CARE_PROVIDER_SITE_OTHER): Payer: Medicare HMO | Admitting: Internal Medicine

## 2013-09-28 ENCOUNTER — Encounter: Payer: Self-pay | Admitting: Internal Medicine

## 2013-09-28 ENCOUNTER — Encounter (INDEPENDENT_AMBULATORY_CARE_PROVIDER_SITE_OTHER): Payer: Self-pay

## 2013-09-28 VITALS — BP 144/80 | HR 61 | Ht 64.0 in | Wt 242.0 lb

## 2013-09-28 DIAGNOSIS — I5022 Chronic systolic (congestive) heart failure: Secondary | ICD-10-CM

## 2013-09-28 DIAGNOSIS — I428 Other cardiomyopathies: Secondary | ICD-10-CM

## 2013-09-28 LAB — MDC_IDC_ENUM_SESS_TYPE_INCLINIC
Battery Voltage: 3 V
Brady Statistic AP VP Percent: 0 %
Brady Statistic RA Percent Paced: 43.28 %
Brady Statistic RV Percent Paced: 0.05 %
Date Time Interrogation Session: 20150129101523
HIGH POWER IMPEDANCE MEASURED VALUE: 51 Ohm
HIGH POWER IMPEDANCE MEASURED VALUE: 64 Ohm
Lead Channel Impedance Value: 475 Ohm
Lead Channel Pacing Threshold Amplitude: 1 V
Lead Channel Pacing Threshold Pulse Width: 0.4 ms
Lead Channel Sensing Intrinsic Amplitude: 13.75 mV
Lead Channel Setting Pacing Amplitude: 2 V
Lead Channel Setting Pacing Amplitude: 2.5 V
Lead Channel Setting Sensing Sensitivity: 0.3 mV
MDC IDC MSMT LEADCHNL LV IMPEDANCE VALUE: 4047 Ohm
MDC IDC MSMT LEADCHNL LV IMPEDANCE VALUE: 4047 Ohm
MDC IDC MSMT LEADCHNL LV IMPEDANCE VALUE: 4047 Ohm
MDC IDC MSMT LEADCHNL RA IMPEDANCE VALUE: 494 Ohm
MDC IDC MSMT LEADCHNL RA PACING THRESHOLD AMPLITUDE: 1 V
MDC IDC MSMT LEADCHNL RA SENSING INTR AMPL: 1.75 mV
MDC IDC MSMT LEADCHNL RV PACING THRESHOLD PULSEWIDTH: 0.4 ms
MDC IDC MSMT LEADCHNL RV SENSING INTR AMPL: 17 mV
MDC IDC SET LEADCHNL RV PACING PULSEWIDTH: 0.4 ms
MDC IDC SET ZONE DETECTION INTERVAL: 290 ms
MDC IDC SET ZONE DETECTION INTERVAL: 350 ms
MDC IDC SET ZONE DETECTION INTERVAL: 350 ms
MDC IDC STAT BRADY AP VS PERCENT: 43.28 %
MDC IDC STAT BRADY AS VP PERCENT: 0.04 %
MDC IDC STAT BRADY AS VS PERCENT: 56.68 %
Zone Setting Detection Interval: 450 ms

## 2013-09-28 NOTE — Assessment & Plan Note (Signed)
Chronic systolic heart failure is class II. He will continue his current medications, and I've encouraged the patient to maintain a low-sodium diet.

## 2013-09-28 NOTE — Progress Notes (Signed)
HPI [name] returns today for followup. He is a very pleasant 80 year old man with an ischemic cardiomyopathy, chronic systolic heart failure, diabetes, morbid obesity, hypertension, status post ICD implantation. In the interim, despite his multiple medical problems, he has remained stable. The patient denies chest pain, shortness of breath, or syncope. He has arthritis complaints. He has been upset as his wife has broken her back and in severe pain for the past 3 weeks. No Known Allergies   Current Outpatient Prescriptions  Medication Sig Dispense Refill  . allopurinol (ZYLOPRIM) 100 MG tablet Take 100 mg by mouth 2 (two) times daily.      Marland Kitchen aspirin 81 MG tablet Take 160 mg by mouth daily.      Marland Kitchen atorvastatin (LIPITOR) 40 MG tablet Take 40 mg by mouth daily.      . cloNIDine (CATAPRES) 0.1 MG tablet Take 0.1 mg by mouth 2 (two) times daily.      . furosemide (LASIX) 40 MG tablet Take 40 mg by mouth daily.      Marland Kitchen gabapentin (NEURONTIN) 100 MG capsule Take 1 capsule by mouth daily.      Marland Kitchen glipiZIDE (GLUCOTROL) 10 MG tablet Take 10 mg by mouth daily.      Marland Kitchen glucose blood (ACCU-CHEK AVIVA) test strip 1 each by Other route as needed. Use as instructed      . insulin aspart (NOVOLOG) 100 UNIT/ML injection Inject into the skin as directed.      . insulin glargine (LANTUS SOLOSTAR) 100 UNIT/ML injection Inject 80 Units into the skin at bedtime. Took 40 last night      . KLOR-CON M20 20 MEQ tablet       . metFORMIN (GLUCOPHAGE) 1000 MG tablet Take 500 mg by mouth 2 (two) times daily with a meal.      . metoprolol tartrate (LOPRESSOR) 25 MG tablet Take 25 mg by mouth 2 (two) times daily.      . Multiple Vitamin (MULTIVITAMIN) tablet Take 1 tablet by mouth daily. Eye formula      . Na Sulfate-K Sulfate-Mg Sulf SOLN Take 1 kit by mouth once.  354 mL  0  . Omega-3 Fatty Acids (FISH OIL) 1000 MG CPDR Take 1 tablet by mouth daily.      Marland Kitchen omeprazole (PRILOSEC) 20 MG capsule Take 20 mg by mouth daily.       . potassium chloride (KLOR-CON) 20 MEQ packet Take 20 mEq by mouth daily.      . quinapril (ACCUPRIL) 40 MG tablet Take 40 mg by mouth at bedtime.       No current facility-administered medications for this visit.     Past Medical History  Diagnosis Date  . CAD (coronary artery disease)   . S/P CABG (coronary artery bypass graft) 11/02/2000  . Hypertensive heart disease without CHF 07/31/2011  . ICD dual chamber in situ   . Sleep apnea   . Morbid obesity   . Erosive esophagitis   . Cardiomyopathy   . Erectile dysfunction   . Metabolic syndrome   . Osteoarthritis   . Calcium oxalate renal stones   . Adenomatous colon polyp 2006  . Hemorrhoids   . Diabetes   . HTN (hypertension)   . HLD (hyperlipidemia)   . ICD (implantable cardiac defibrillator) in place     ROS:   All systems reviewed and negative except as noted in the HPI.   Past Surgical History  Procedure Laterality Date  . Doppler echocardiography  2003  .  Back surgery      X'3  . Rotator cuff surgery      left  . Carpal tunnel release      X2, bilateral  . Abdominal exploration surgery    . Laparotomy    . Cardiac bypass    . Cardiac defibrillator placement    . Esophagogastroduodenoscopy  06/20/2012    Procedure: ESOPHAGOGASTRODUODENOSCOPY (EGD);  Surgeon: Sable Feil, MD;  Location: [facility];  Service: Endoscopy;  Laterality: N/A;  . Colonoscopy  06/20/2012    Procedure: COLONOSCOPY;  Surgeon: Sable Feil, MD;  Location: [facility];  Service: Endoscopy;  Laterality: N/A;     Family History  Problem Relation Age of Onset  . Diabetes Brother   . Heart disease Father   . Heart disease Mother   . Throat cancer Paternal Uncle      History   Social History  . Marital Status: Married    Spouse Name: N/A    Number of Children: 4  . Years of Education: N/A   Occupational History  . Retired from [employer]  . Smoking status: Never Smoker    . Smokeless tobacco: Never Used  . Alcohol Use: No  . Drug Use: No  . Sexual Activity: Not on file   Other Topics Concern  . Not on file   Social History Narrative  . No narrative on file     BP 144/80  Pulse 61  Ht 5' 4" (1.626 m)  Wt 242 lb (109.77 kg)  BMI 41.52 kg/m2  Physical Exam:  Morbidly obese appearing 80 year old man, NAD HEENT: Unremarkable Neck:  7 cm  JVD, no thyromegally Lymphatics:  No adenopathy Back:  No CVA tenderness Lungs:  Clear HEART:  Regular rate rhythm, no murmurs, no rubs, no clicks Abd:  soft,  obese, positive bowel sounds, no organomegally, no rebound, no guarding Ext:  2 plus pulses, no edema, no cyanosis, no clubbing Skin:  No rashes no nodules Neuro:  CN II through XII intact, motor grossly intact  DEVICE  Normal device function.  See PaceArt for details.   Assess/Plan:

## 2013-09-28 NOTE — Patient Instructions (Signed)
Your physician wants you to follow-up in: 12 months with Dr. Taylor. You will receive a reminder letter in the mail two months in advance. If you don't receive a letter, please call our office to schedule the follow-up appointment.    

## 2013-09-28 NOTE — Assessment & Plan Note (Signed)
His Medtronic dual-chamber ICD is working normally. We'll plan to recheck in several months.

## 2013-11-15 ENCOUNTER — Other Ambulatory Visit: Payer: Self-pay | Admitting: Orthopedic Surgery

## 2013-11-15 DIAGNOSIS — R531 Weakness: Secondary | ICD-10-CM

## 2013-11-15 DIAGNOSIS — M25512 Pain in left shoulder: Secondary | ICD-10-CM

## 2013-11-21 ENCOUNTER — Ambulatory Visit
Admission: RE | Admit: 2013-11-21 | Discharge: 2013-11-21 | Disposition: A | Discharge status: Home / Self Care | Payer: Medicare HMO | Source: Ambulatory Visit | Attending: Orthopedic Surgery | Admitting: Orthopedic Surgery

## 2013-11-21 DIAGNOSIS — R531 Weakness: Secondary | ICD-10-CM

## 2013-11-21 DIAGNOSIS — M25512 Pain in left shoulder: Secondary | ICD-10-CM

## 2013-11-21 MED ORDER — IOHEXOL 180 MG/ML  SOLN
15.0000 mL | Freq: Once | INTRAMUSCULAR | Status: AC | PRN
  Administered 2013-11-21: 15 mL via INTRA_ARTICULAR

## 2014-05-03 ENCOUNTER — Telehealth: Payer: Self-pay | Admitting: *Deleted

## 2014-05-03 NOTE — Telephone Encounter (Signed)
Patient has humana and is on several medications but none controlled. Patient will call to schedule appt when he is ready

## 2014-05-30 ENCOUNTER — Ambulatory Visit: Payer: Commercial Managed Care - HMO | Admitting: Family Medicine

## 2014-06-12 ENCOUNTER — Encounter: Payer: Self-pay | Admitting: Family Medicine

## 2014-06-12 ENCOUNTER — Ambulatory Visit (INDEPENDENT_AMBULATORY_CARE_PROVIDER_SITE_OTHER): Payer: Commercial Managed Care - HMO | Admitting: Family Medicine

## 2014-06-12 VITALS — BP 117/64 | HR 61 | Temp 96.4°F | Ht 64.0 in | Wt 231.0 lb

## 2014-06-12 DIAGNOSIS — E785 Hyperlipidemia, unspecified: Secondary | ICD-10-CM

## 2014-06-12 DIAGNOSIS — G4733 Obstructive sleep apnea (adult) (pediatric): Secondary | ICD-10-CM

## 2014-06-12 DIAGNOSIS — E118 Type 2 diabetes mellitus with unspecified complications: Secondary | ICD-10-CM

## 2014-06-12 DIAGNOSIS — I119 Hypertensive heart disease without heart failure: Secondary | ICD-10-CM

## 2014-06-12 DIAGNOSIS — I5022 Chronic systolic (congestive) heart failure: Secondary | ICD-10-CM

## 2014-06-12 DIAGNOSIS — Z23 Encounter for immunization: Secondary | ICD-10-CM

## 2014-06-12 LAB — POCT GLYCOSYLATED HEMOGLOBIN (HGB A1C): HEMOGLOBIN A1C: 7.5

## 2014-06-12 NOTE — Progress Notes (Signed)
   Subjective:    Patient ID: Manuel Kline, male    DOB: 27-Jun-1932, 80 y.o.   MRN: [id number]  HPI 80 year old gentleman with diabetes who has been followed by an endocrinologist in [place] and needs a referral back t he is on multiple medications for diabetes. I explained that we could follow his diabetes here but he seemed a little rock and agreed to indicate that the endocrinologist and we will see where that goes. He also has a history of systolic heart failure and has an implantable defibrillator. There is a history of sleep apnea but he no longer uses the machine since it has stopped functioning. .    Review of Systems  Musculoskeletal: Positive for arthralgias.  Psychiatric/Behavioral: Positive for sleep disturbance.       Objective:   Physical Exam  Constitutional: He is oriented to person, place, and time.  obese  HENT:  Head: Normocephalic.  Eyes: Pupils are equal, round, and reactive to light.  Neck: Normal range of motion.  Cardiovascular: Normal rate and regular rhythm.   Pulmonary/Chest: Effort normal.  Abdominal: Soft.  Musculoskeletal: Normal range of motion.  Neurological: He is alert and oriented to person, place, and time. He has normal reflexes.  Skin: Skin is warm.  Psychiatric: He has a normal mood and affect.    BP 117/64  Pulse 61  Temp(Src) 96.4 F (35.8 C) (Oral)  Ht _0  (1.626 m)  Wt 231 lb (104.781 kg)  BMI 39.63 kg/m2      Assessment & Plan:  1. Hyperlipidemia  - Lipid panel  2. Diabetes mellitus type 2 with complications  - POCT glycosylated hemoglobin (Hb A1C) - CMP14+EGFR - Ambulatory referral to Endocrinology  3. Hypertensive heart disease without CHF   4. Chronic systolic heart failure   5. OSA (obstructive sleep apnea)  - Ambulatory referral to   [name] MD

## 2014-06-13 LAB — CMP14+EGFR
A/G RATIO: 1.9 (ref 1.1–2.5)
ALT: 16 IU/L (ref 0–44)
AST: 21 IU/L (ref 0–40)
Albumin: 4.7 g/dL (ref 3.5–4.7)
Alkaline Phosphatase: 120 IU/L — ABNORMAL HIGH (ref 39–117)
BILIRUBIN TOTAL: 0.8 mg/dL (ref 0.0–1.2)
BUN/Creatinine Ratio: 12 (ref 10–22)
BUN: 27 mg/dL (ref 8–27)
CALCIUM: 9.7 mg/dL (ref 8.6–10.2)
CHLORIDE: 96 mmol/L — AB (ref 97–108)
CO2: 26 mmol/L (ref 18–29)
Creatinine, Ser: 2.27 mg/dL — ABNORMAL HIGH (ref 0.76–1.27)
GFR calc Af Amer: 30 mL/min/{1.73_m2} — ABNORMAL LOW (ref >59)
GFR calc non Af Amer: 26 mL/min/{1.73_m2} — ABNORMAL LOW (ref >59)
GLUCOSE: 73 mg/dL (ref 65–99)
Globulin, Total: 2.5 g/dL (ref 1.5–4.5)
POTASSIUM: 4.2 mmol/L (ref 3.5–5.2)
SODIUM: 139 mmol/L (ref 134–144)
TOTAL PROTEIN: 7.2 g/dL (ref 6.0–8.5)

## 2014-06-13 LAB — LIPID PANEL
CHOLESTEROL TOTAL: 145 mg/dL (ref 100–199)
Chol/HDL Ratio: 4.1 ratio units (ref 0.0–5.0)
HDL: 35 mg/dL — AB (ref >39)
LDL Calculated: 66 mg/dL (ref 0–99)
Triglycerides: 221 mg/dL — ABNORMAL HIGH (ref 0–149)
VLDL CHOLESTEROL CAL: 44 mg/dL — AB (ref 5–40)

## 2014-07-04 ENCOUNTER — Ambulatory Visit (INDEPENDENT_AMBULATORY_CARE_PROVIDER_SITE_OTHER): Payer: Commercial Managed Care - HMO | Admitting: Neurology

## 2014-07-04 ENCOUNTER — Encounter: Payer: Self-pay | Admitting: Neurology

## 2014-07-04 VITALS — BP 111/66 | HR 67 | Temp 97.7°F | Resp 14 | Ht 66.5 in | Wt 235.5 lb

## 2014-07-04 DIAGNOSIS — E1142 Type 2 diabetes mellitus with diabetic polyneuropathy: Secondary | ICD-10-CM

## 2014-07-04 DIAGNOSIS — G4736 Sleep related hypoventilation in conditions classified elsewhere: Secondary | ICD-10-CM

## 2014-07-04 DIAGNOSIS — G4731 Primary central sleep apnea: Secondary | ICD-10-CM

## 2014-07-04 DIAGNOSIS — G4730 Sleep apnea, unspecified: Secondary | ICD-10-CM

## 2014-07-04 NOTE — Patient Instructions (Signed)
Sleep Apnea  Sleep apnea is disorder that affects a person's sleep. A person with sleep apnea has abnormal pauses in their breathing when they sleep. It is hard for them to get a good sleep. This makes a person tired during the day. It also can lead to other physical problems. There are three types of sleep apnea. One type is when breathing stops for a short time because your airway is blocked (obstructive sleep apnea). Another type is when the brain sometimes fails to give the normal signal to breathe to the muscles that control your breathing (central sleep apnea). The third type is a combination of the other two types.  HOME CARE  · Do not sleep on your back. Try to sleep on your side.  · Take all medicine as told by your doctor.  · Avoid alcohol, calming medicines (sedatives), and depressant drugs.  · Try to lose weight if you are overweight. Talk to your doctor about a healthy weight goal.  Your doctor may have you use a device that helps to open your airway. It can help you get the air that you need. It is called a positive airway pressure (PAP) device. There are three types of PAP devices:  · Continuous positive airway pressure (CPAP) device.  · Nasal expiratory positive airway pressure (EPAP) device.  · Bilevel positive airway pressure (BPAP) device.  MAKE SURE YOU:  · Understand these instructions.  · Will watch your condition.  · Will get help right away if you are not doing well or get worse.  Document Released: 05/26/2008 Document Revised: 08/03/2012 Document Reviewed: 12/19/2011  ExitCare® Patient Information ©2015 [employer]. This information is not intended to replace advice given to you by your health care provider. Make sure you discuss any questions you have with your health care provider.

## 2014-07-04 NOTE — Progress Notes (Signed)
Chief Complaint  Patient presents with  . NP Sleep  Dr. Alain Honey    Rm 11, wife     [place]   Provider:  Larey Seat, M D  Referring Provider: [name], MD Primary Care Physician:  [name], MD  Chief Complaint  Patient presents with  . NP Sleep  Dr. Alain Honey    Rm 36, wife    HPI:  Manuel Kline is a 80 y.o. male ,seen here as a referral  from Dr. Sabra Heck  at [facility],   The patient is also followed by Dr. Elyse Hsu and cardiologist Tollie Eth, MD.   Dear Dr. Sabra Heck   Manuel Kline was last seen 7-1/2 years ago in my practice, at that time a patient of Dr Edrick Oh.  At the time he carried the diagnosis of a complicated or so-called complex sleep apnea syndrome. The patient was placed on a VPAP because of his cardiac history including cardiomyopathy, coronary artery disease, congestive heart failure and he has a defibrillator implanted. His residual apnea index was 7.5 his sleep study in 2007 diagnosed him with an AHI of 77 apneas per hour of sleep. The patient had meanwhile felt that the machine did not longer benefit him or that it actually multifunctioned. He has not been using it since 2012, but never contacted [place] or the DME. He is here today to see if he actually still needs apnea treatment and if his condition is still present.  He is here today to find out  what the best treatment would be should be still find apnea to be present. His wife is here today with him, stating that the patient kicks a lot at night that he seems not to be waking from it.  The patient watches every night TV late, and goes to bed at about 11.30- will promptly fall asleep. He snores thunderously, he will sleep supine due to a shoulder injury. His wife has noted short pauses in breathing , followed by a leg kick.  He has one bathroom break at 4 AM. He rises at 7.30 , is woken by his wife. He feels refreshed. He has slept several  night in a recliner when he has coughing and  "Chest congestion".    He had a tonsillectomy in childhood.  No facial or neck surgeries.     The patient endorsed today a high degree of fatigue, SOB with minimal exertion, Fatigue degree 54 points, Epworth 8 , GDS zero points.        Review of Systems: Out of a complete 14 system review, the patient complains of only the following symptoms, and all other reviewed systems are negative. The patient endorsed today a high degree of fatigue, SOB with minimal exertion, Fatigue degree 54 points, Epworth 8 , GDS zero points.     History   Social History  . Marital Status: Married    Spouse Name: N/A    Number of Children: 4  . Years of Education: N/A   Occupational History  . Retired from [employer]  . Smoking status: Never Smoker   . Smokeless tobacco: Never Used  . Alcohol Use: No  . Drug Use: No  . Sexual Activity: Not on file   Other Topics Concern  . Not on file   Social History Narrative   Right handed, Married, 4 kids from previous marriage.  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  Caffeine 1 cup daily.    Family History  Problem Relation Age of Onset  . Diabetes Brother   . Heart disease Father   . Heart disease Mother   . Throat cancer Paternal Uncle     Past Medical History  Diagnosis Date  . CAD (coronary artery disease)   . S/P CABG (coronary artery bypass graft) 11/02/2000  . Hypertensive heart disease without CHF 07/31/2011  . ICD dual chamber in situ   . Sleep apnea   . Morbid obesity   . Erosive esophagitis   . Cardiomyopathy   . Erectile dysfunction   . Metabolic syndrome   . Osteoarthritis   . Calcium oxalate renal stones   . Adenomatous colon polyp 2006  . Hemorrhoids   . Diabetes   . HTN (hypertension)   . HLD (hyperlipidemia)   . ICD (implantable cardiac defibrillator) in place     Past Surgical History  Procedure Laterality Date  . Doppler echocardiography  2003  .  Back surgery      X'3  . Rotator cuff surgery      left  . Carpal tunnel release      X2, bilateral  . Abdominal exploration surgery    . Laparotomy    . Cardiac bypass    . Cardiac defibrillator placement    . Esophagogastroduodenoscopy  06/20/2012    Procedure: ESOPHAGOGASTRODUODENOSCOPY (EGD);  Surgeon: Sable Feil, MD;  Location: [facility];  Service: Endoscopy;  Laterality: N/A;  . Colonoscopy  06/20/2012    Procedure: COLONOSCOPY;  Surgeon: Sable Feil, MD;  Location: [facility];  Service: Endoscopy;  Laterality: N/A;    Current Outpatient Prescriptions  Medication Sig Dispense Refill  . allopurinol (ZYLOPRIM) 100 MG tablet Take 100 mg by mouth 2 (two) times daily.    Marland Kitchen aspirin 81 MG tablet Take 81 mg by mouth daily.     Marland Kitchen atorvastatin (LIPITOR) 40 MG tablet Take 40 mg by mouth daily.    . cloNIDine (CATAPRES) 0.1 MG tablet Take 0.1 mg by mouth 2 (two) times daily.    . furosemide (LASIX) 40 MG tablet Take 40 mg by mouth daily.    Marland Kitchen gabapentin (NEURONTIN) 100 MG capsule Take 2 capsules by mouth at bedtime.     Marland Kitchen glipiZIDE (GLUCOTROL) 10 MG tablet Take 10 mg by mouth daily.    Marland Kitchen glucose blood (ACCU-CHEK AVIVA) test strip 1 each by Other route as needed. Use as instructed    . insulin glargine (LANTUS SOLOSTAR) 100 UNIT/ML injection Inject 80 Units into the skin at bedtime. Took 40 last night    . KLOR-CON M20 20 MEQ tablet     . Liraglutide (VICTOZA) 18 MG/3ML SOPN Inject 1.8 mg into the skin daily.    . metFORMIN (GLUCOPHAGE) 1000 MG tablet Take 500 mg by mouth 2 (two) times daily with a meal.    . metoprolol tartrate (LOPRESSOR) 25 MG tablet Take 25 mg by mouth 2 (two) times daily.    . Multiple Vitamin (MULTIVITAMIN) tablet Take 1 tablet by mouth daily. Eye formula    . Omega-3 Fatty Acids (FISH OIL) 1000 MG CPDR Take 1 tablet by mouth daily.    Marland Kitchen omeprazole (PRILOSEC) 20 MG capsule Take 20 mg by mouth daily.    . quinapril (ACCUPRIL) 40 MG tablet Take 40 mg  by mouth at bedtime.    . insulin aspart (NOVOLOG) 100 UNIT/ML injection Inject into the skin as directed.     No current  facility-administered medications for this visit.    Allergies as of 07/04/2014  . (No Known Allergies)    Vitals: BP 111/66 mmHg  Pulse 67  Temp(Src) 97.7 F (36.5 C) (Oral)  Resp 14  Ht 5' 6.5" (1.689 m)  Wt 235 lb 8 oz (106.822 kg)  BMI 37.45 kg/m2 Last Weight:  Wt Readings from Last 1 Encounters:  07/04/14 235 lb 8 oz (106.822 kg)       Last Height:   Ht Readings from Last 1 Encounters:  07/04/14 5' 6.5" (1.689 m)    Physical exam:  General: The patient is awake, alert and appears not in acute distress. The patient is well groomed. Head: Normocephalic, atraumatic. Neck is supple. Mallampati 2- lateral narrowing, but  uvula elevated in midline ,  neck circumference: 19 inches . Nasal airflow restricted, TMJ is  not evident . Retrognathia is not seen.  He has delayed swallowing  Cardiovascular:  Regular rate and rhythm , without  murmurs or carotid bruit, and without distended neck veins. Respiratory: Lungs are clear to auscultation. Skin:  Without evidence of edema, or rash Trunk: BMI is  elevated and patient  has normal posture.  Neurologic exam : The patient is awake and alert, oriented to place and time.   Memory subjective  described as intact. There is a normal attention span & concentration ability.  Speech is fluent without  dysarthria, dysphonia or aphasia.  Mood and affect are appropriate.  Cranial nerves: Pupils are reactive to light.  He had cataract surgery, Funduscopic exam without evidence of edema- he has several scars from laser retinal surgery- diabetic eye disease.  Marland Kitchen Extraocular movements  in vertical and horizontal planes intact and without nystagmus. Visual fields by finger perimetry are intact. Hearing loss.   Facial sensation intact to fine touch. Facial motor strength is symmetric and tongue and uvula move midline.  Motor  exam:  Normal tone, muscle bulk and symmetric strength in all extremities.  Sensory:  Fine touch, pinprick and vibration were tested in all extremities.  Proprioception is impaired .  Coordination: Rapid alternating movements in the fingers/hands is slowed .  Finger-to-nose maneuver normal without evidence of ataxia, dysmetria or tremor.  Gait and station: Patient walks without assistive device and is able unassisted to climb up to the exam table. Strength within normal limits. Stance is stable and normal.  Deep tendon reflexes: in the upper and lower extremities are attenuated . Babinski maneuver response is equivocal   Assessment:  After physical and neurologic examination, review of laboratory studies, imaging, neurophysiology testing and pre-existing records, assessment is that of a patient with significant cardio vascular risk factors and ongoing snoring, high fatigue. Diabetic polyneuropathy, eye disease and vasculopathy, CAD,CHF, defibrillator patient. .  The patient was advised of the nature of the diagnosed sleep disorder , the treatment options and risks for general a health and wellness arising from not treating the condition. Visit duration was 45  minutes.   Plan:  Treatment plan and additional workup : Patient will need to undergo a SPLIT study with CO2 measures, SPLIT at 4% and AHI 15,  He used a FFM in the past, please evaluate if a nasal pillow could be used.      Asencion Partridge Karaline Buresh MD  07/04/2014

## 2014-08-09 ENCOUNTER — Ambulatory Visit (INDEPENDENT_AMBULATORY_CARE_PROVIDER_SITE_OTHER): Payer: Commercial Managed Care - HMO | Admitting: Neurology

## 2014-08-09 DIAGNOSIS — G4731 Primary central sleep apnea: Secondary | ICD-10-CM

## 2014-08-09 DIAGNOSIS — E1142 Type 2 diabetes mellitus with diabetic polyneuropathy: Secondary | ICD-10-CM

## 2014-08-09 DIAGNOSIS — G4736 Sleep related hypoventilation in conditions classified elsewhere: Secondary | ICD-10-CM

## 2014-08-09 DIAGNOSIS — G4730 Sleep apnea, unspecified: Secondary | ICD-10-CM

## 2014-08-10 NOTE — Sleep Study (Signed)
Please see the scanned sleep study interpretation located in the Procedure tab within the Chart Review section. 

## 2014-08-17 ENCOUNTER — Telehealth: Payer: Self-pay | Admitting: Neurology

## 2014-08-17 ENCOUNTER — Encounter: Payer: Self-pay | Admitting: Cardiology

## 2014-08-17 DIAGNOSIS — G4733 Obstructive sleep apnea (adult) (pediatric): Secondary | ICD-10-CM

## 2014-08-17 DIAGNOSIS — I2510 Atherosclerotic heart disease of native coronary artery without angina pectoris: Secondary | ICD-10-CM

## 2014-08-17 DIAGNOSIS — I420 Dilated cardiomyopathy: Secondary | ICD-10-CM

## 2014-08-17 DIAGNOSIS — Z95810 Presence of automatic (implantable) cardiac defibrillator: Secondary | ICD-10-CM

## 2014-08-17 DIAGNOSIS — I255 Ischemic cardiomyopathy: Secondary | ICD-10-CM

## 2014-08-17 NOTE — Telephone Encounter (Signed)
Result of PSG , AHI 54.8 , hypoxemia.   Plan: 10 cm CPAP eson medium size,

## 2014-08-17 NOTE — Progress Notes (Signed)
Patient ID: [facility], male   DOB: 13-Oct-1931, 80 y.o.   MRN: [id number]    [name]  Date of visit:  08/17/2014 DOB:  [birth year]    Age:  80 yrs. Medical record number:  [id number]     Account number:  [id number] Primary Care Provider: Redge Gainer ____________________________ CURRENT DIAGNOSES  1. Hypertensive heart disease without heart failure  2. Atherosclerotic heart disease of native coronary artery without angina pectoris  3. Ischemic cardiomyopathy  4. Chronic systolic heart failure  5. Type 2 diabetes mellitus with diabetic polyneuropathy  6. Morbid (severe) obesity  7. Other hyperlipidemia  8. Sleep apnea  9. Idiopathic Gout  10. Chronic kidney disease, stage 3 (moderate)  11. Presence of automatic (implantable) cardiac defibrillator  12. Presence of aortocoronary bypass graft ____________________________ ALLERGIES  No Known Drug Allergies ____________________________ MEDICATIONS  1. Metformin 1,000 mg Tablet, 1/2 tab b.i.d.  2. Klor-Con M20 20 mEq Tab Sust.Rel. Particle/Crystal, 1 p.o. daily  3. Novolog 100 unit/mL Solution, sliding scale  4. aspirin 81 mg Tablet, Chewable, 1 p.o. q.d.  5. glipizide 10 mg Tablet, 1 p.o. daily  6. Lantus 100 unit/mL Solution, 80 u qpm  7. omeprazole 20 mg tablet,delayed release (DR/EC), 1 qod  8. Fish Oil 1,000 mg capsule, 1 p.o. daily  9. Vitamin B-12 1,000 mcg tablet, 1 p.o. daily  10. allopurinol 300 mg tablet, 1 p.o. daily  11. gabapentin 100 mg capsule, 2 ghs  12. nitroglycerin 0.4 mg tablet, sublingual, PRN  13. Victoza 2-Pak 0.6 mg/0.1 mL (18 mg/3 mL) subcutaneous pen injector, 1.2mg  daily  14. furosemide 40 mg tablet, BID  15. quinapril 40 mg tablet, 1 p.o. daily  16. atorvastatin 80 mg tablet, 1/2 tab daily  17. clonidine HCl 0.1 mg tablet, BID  18. multivitamin tablet, 1 p.o. daily  19. metoprolol succinate ER 50 mg tablet,extended release 24 hr, 1 p.o. daily ____________________________ CHIEF COMPLAINTS  Followup of  Atherosclerotic heart disease of native coronary artery without angina pectoris  Followup of Ischemic cardiomyopathy ____________________________ HISTORY OF PRESENT ILLNESS  Patient seen for cardiac followup. He has been doing well since he was previously here. He is exercising on a regular basis and his lipids were reviewed today and show excellent control. He denies angina and has no PND, orthopnea, syncope, palpitations, or claudication. He evidently went to see the physician regarding sleep apnea and is in the process of getting a new CPAP device. No defibrillator discharges.  ____________________________ PAST HISTORY  Past Medical Illnesses:  DM-insulin dependent, hypertension, hyperlipidemia, erosive esophagitis, obesity, sleep apnea, osteoarthritis, lumbar disc disease, hiatal hernia, peripheral neuropathy;  Cardiovascular Illnesses:  CAD, ventricular tachycardia, syncope;  Surgical Procedures:  CABG wtih LIMA to LAD, SVG to dx, SVG to OM, SVG to AM-RCA March 2002 Dr. Servando Snare, AICD implant 01/16/02 Dr. Lovena Le, carpal tunnel release, lumbar laminectomy x 3, shoulder repair-rt, exploratory lap;  NYHA Classification:  II;  Canadian Angina Classification:  Class 0: Asymptomatic;  Cardiology Procedures-Invasive:  cardiac cath (left) 2002 1992;  Cardiology Procedures-Noninvasive:  adenosine cardiolite February 2008, echocardiogram February 2010, echocardiogram December 2011, echocardiogram June 2013;  Cardiac Cath Results:  calcification Left main, 50% stenosis mid Left main, 90% stenosis LAD, 90% stenosis CFX, 90% stenosis OM 1, 60% stenosis proximal RCA, 90% stenosis mid RCA;  LVEF of 40% documented via echocardiogram on 02/17/2012,   ____________________________ CARDIO-PULMONARY TEST DATES EKG Date:  08/17/2014;   Cardiac Cath Date:  10/29/2000;  CABG: 11/02/2000;  Nuclear Study Date:  01/04/2008;  Echocardiography Date: 02/17/2012;  Chest Xray Date: 10/02/2008;   ____________________________ SOCIAL  HISTORY Alcohol Use:  does not use alcohol;  Smoking:  never smoked;  Diet:  diabetic diet;  Lifestyle:  divorced and remarried;  Exercise:  exercises regularly;  Occupation:  retired and Development worker, community;  Residence:  lives with wife of over 32 years;   ____________________________ REVIEW OF SYSTEMS General:  obesity, weight loss of approximately 5 lbs Eyes: diabetic retinopathy, cataracts Ears, Nose, Throat, Mouth:  partial hearing loss Respiratory: denies dyspnea, cough, wheezing or hemoptysis. Cardiovascular:  please review HPI Abdominal: constipation Genitourinary-Male: erectile dysfunction, nocturia  Musculoskeletal:  chronic low back pain, arthritis of the knee, arthritis of the left shoulder Neurological:  peripheral neuropathy  ____________________________ PHYSICAL EXAMINATION VITAL SIGNS  Blood Pressure:  110/70 Sitting, Left arm, large cuff  , 110/64 Standing, Left arm and large cuff   Pulse:  80/min. Weight:  232.00 lbs. Height:  64"BMI: 40  Constitutional:  pleasant white male in no acute distress, severely obese Skin:  warm and dry to touch, no apparent skin lesions, or masses noted. Head:  normocephalic, normal hair pattern, no masses or tenderness ENT:  bilateral hearing aides present Neck:  supple, no masses, thyromegaly, JVD. Carotid pulses are full and equal bilaterally without bruits. Chest:  clear to auscultation, healed median sternotomy scar, healed ICD incision in the left pectoral area Cardiac:  regular rhythm, normal S1 and S2, No S3 or S4, no murmurs, gallops or rubs detected. Peripheral Pulses:  the femoral,dorsalis pedis, and posterior tibial pulses are full and equal bilaterally with no bruits auscultated. Extremities & Back:  well healed saphenous vein donor site RLE, no edema present Neurological:  decreased senstion of feet ____________________________ MOST RECENT LIPID PANEL 06/25/14  CHOL TOTL 145 mg/dl, LDL 66 NM, HDL 35 mg/dl and TRIGLYCER 221  mg/dl ____________________________ IMPRESSIONS/PLAN  1. Ischemic cardiomyopathy with chronic systolic heart failure currently compensated 2. Coronary artery disease with previous bypass grafting with no angina 3. Sleep apnea 4. Hyperlipidemia controlled  Recommendations:  Has lost some weight and I congratulated him on this. He has changed primary care physicians. Recommended followup in 6 months. Continue remote defibrillator followup. EKG shows sinus with LBBB. ____________________________ TODAYS ORDERS  1. 12 Lead EKG: Today  2. 2D, color flow, doppler: At Patient Convenience  3. Return Visit: 6 months                       ____________________________ Cardiology Physician:  Kerry Hough MD [facility]

## 2014-08-20 ENCOUNTER — Encounter: Payer: Self-pay | Admitting: Neurology

## 2014-08-20 ENCOUNTER — Encounter: Payer: Self-pay | Admitting: *Deleted

## 2014-08-20 NOTE — Telephone Encounter (Signed)
Patient was contacted and provided the results of his split night sleep study.  Patient was advised that due to the positive diagnosis of OSA treatment in the form of CPAP had been recommended.  Patient was in agreement and was referred to [facility] for CPAP set up.  The patient gave verbal permission to mail a copy of his test results.   Patient instructed to contact our office 6-8 weeks post set up to schedule a follow up appointment.

## 2014-09-10 DIAGNOSIS — Z95810 Presence of automatic (implantable) cardiac defibrillator: Secondary | ICD-10-CM | POA: Diagnosis not present

## 2014-09-17 DIAGNOSIS — G4733 Obstructive sleep apnea (adult) (pediatric): Secondary | ICD-10-CM | POA: Diagnosis not present

## 2014-09-28 ENCOUNTER — Encounter: Payer: Self-pay | Admitting: Internal Medicine

## 2014-09-28 ENCOUNTER — Ambulatory Visit (INDEPENDENT_AMBULATORY_CARE_PROVIDER_SITE_OTHER): Payer: Medicare Other | Admitting: Internal Medicine

## 2014-09-28 VITALS — BP 144/80 | HR 65 | Ht 66.5 in | Wt 235.0 lb

## 2014-09-28 DIAGNOSIS — I255 Ischemic cardiomyopathy: Secondary | ICD-10-CM

## 2014-09-28 DIAGNOSIS — Z95810 Presence of automatic (implantable) cardiac defibrillator: Secondary | ICD-10-CM | POA: Diagnosis not present

## 2014-09-28 DIAGNOSIS — I5022 Chronic systolic (congestive) heart failure: Secondary | ICD-10-CM | POA: Diagnosis not present

## 2014-09-28 LAB — MDC_IDC_ENUM_SESS_TYPE_INCLINIC
Brady Statistic AP VS Percent: 19.16 %
Brady Statistic RV Percent Paced: 0.04 %
Date Time Interrogation Session: 20160129121500
HIGH POWER IMPEDANCE MEASURED VALUE: 63 Ohm
HighPow Impedance: 46 Ohm
Lead Channel Impedance Value: 4047 Ohm
Lead Channel Impedance Value: 4047 Ohm
Lead Channel Impedance Value: 494 Ohm
Lead Channel Sensing Intrinsic Amplitude: 1.75 mV
Lead Channel Sensing Intrinsic Amplitude: 1.875 mV
Lead Channel Setting Pacing Amplitude: 2.5 V
Lead Channel Setting Pacing Pulse Width: 0.4 ms
MDC IDC MSMT BATTERY VOLTAGE: 2.95 V
MDC IDC MSMT LEADCHNL LV IMPEDANCE VALUE: 4047 Ohm
MDC IDC MSMT LEADCHNL RV IMPEDANCE VALUE: 418 Ohm
MDC IDC MSMT LEADCHNL RV SENSING INTR AMPL: 11.625 mV
MDC IDC MSMT LEADCHNL RV SENSING INTR AMPL: 12.375 mV
MDC IDC SET LEADCHNL RV PACING AMPLITUDE: 2.5 V
MDC IDC SET LEADCHNL RV SENSING SENSITIVITY: 0.3 mV
MDC IDC SET ZONE DETECTION INTERVAL: 350 ms
MDC IDC STAT BRADY AP VP PERCENT: 0 %
MDC IDC STAT BRADY AS VP PERCENT: 0.04 %
MDC IDC STAT BRADY AS VS PERCENT: 80.8 %
MDC IDC STAT BRADY RA PERCENT PACED: 19.16 %
Zone Setting Detection Interval: 290 ms
Zone Setting Detection Interval: 350 ms
Zone Setting Detection Interval: 450 ms

## 2014-09-28 NOTE — Assessment & Plan Note (Signed)
His chronic systolic heart failure remains class 2. He will continue his current meds.

## 2014-09-28 NOTE — Patient Instructions (Signed)
Your physician recommends that you continue on your current medications as directed. Please refer to the Current Medication list given to you today.  Your physician wants you to follow-up in: 1 year with Dr. Taylor.  You will receive a reminder letter in the mail two months in advance. If you don't receive a letter, please call our office to schedule the follow-up appointment.  

## 2014-09-28 NOTE — Assessment & Plan Note (Signed)
He has lost almost 30lbs. I have encouraged him to continue weight loss.

## 2014-09-28 NOTE — Assessment & Plan Note (Signed)
He denies anginal symptoms. No change in medications.

## 2014-09-28 NOTE — Progress Notes (Signed)
HPI [name] returns today for followup. He is a very pleasant 81 year old man with an ischemic cardiomyopathy, chronic systolic heart failure, diabetes, morbid obesity, hypertension, status post ICD implantation. In the interim, despite his multiple medical problems, he has remained stable. The patient denies chest pain, shortness of breath, or syncope. He has lost almost 30 lbs in the past year. No ICD shocks. No Known Allergies   Current Outpatient Prescriptions  Medication Sig Dispense Refill  . allopurinol (ZYLOPRIM) 100 MG tablet Take 100 mg by mouth 2 (two) times daily.    Marland Kitchen aspirin 81 MG tablet Take 81 mg by mouth daily.     Marland Kitchen atorvastatin (LIPITOR) 40 MG tablet Take 40 mg by mouth daily.    . cloNIDine (CATAPRES) 0.1 MG tablet Take 0.1 mg by mouth 2 (two) times daily.    . furosemide (LASIX) 40 MG tablet Take 40 mg by mouth daily.    Marland Kitchen gabapentin (NEURONTIN) 100 MG capsule Take 2 capsules by mouth at bedtime.     Marland Kitchen glipiZIDE (GLUCOTROL) 10 MG tablet Take 10 mg by mouth daily.    Marland Kitchen glucose blood (ACCU-CHEK AVIVA) test strip 1 each by Other route as needed. Use as instructed    . insulin aspart (NOVOLOG) 100 UNIT/ML injection Inject into the skin as directed.    . insulin glargine (LANTUS SOLOSTAR) 100 UNIT/ML injection Inject 80 Units into the skin at bedtime. Pt states he has been injecting 40 units into the skin twice a day (09/28/14)    . KLOR-CON M20 20 MEQ tablet     . metFORMIN (GLUCOPHAGE) 1000 MG tablet Take 500 mg by mouth 2 (two) times daily with a meal.    . metoprolol tartrate (LOPRESSOR) 25 MG tablet Take 25 mg by mouth 2 (two) times daily.    . Multiple Vitamin (MULTIVITAMIN) tablet Take 1 tablet by mouth daily. Eye formula    . Omega-3 Fatty Acids (FISH OIL) 1000 MG CPDR Take 1 tablet by mouth daily.    Marland Kitchen omeprazole (PRILOSEC) 20 MG capsule Take 20 mg by mouth daily.    . quinapril (ACCUPRIL) 40 MG tablet Take 40 mg by mouth at bedtime.     No current  facility-administered medications for this visit.     Past Medical History  Diagnosis Date  . CAD (coronary artery disease)   . S/P CABG (coronary artery bypass graft) 11/02/2000  . Hypertensive heart disease without CHF 07/31/2011  . ICD dual chamber in situ   . Sleep apnea   . Morbid obesity   . Erosive esophagitis   . Cardiomyopathy   . Erectile dysfunction   . Metabolic syndrome   . Osteoarthritis   . Calcium oxalate renal stones   . Adenomatous colon polyp 2006  . Hemorrhoids   . Diabetes   . HTN (hypertension)   . HLD (hyperlipidemia)   . ICD (implantable cardiac defibrillator) in place     ROS:   All systems reviewed and negative except as noted in the HPI.   Past Surgical History  Procedure Laterality Date  . Doppler echocardiography  2003  . Back surgery      X'3  . Rotator cuff surgery      left  . Carpal tunnel release      X2, bilateral  . Abdominal exploration surgery    . Laparotomy    . Cardiac bypass    . Cardiac defibrillator placement    . Esophagogastroduodenoscopy  06/20/2012    Procedure:  ESOPHAGOGASTRODUODENOSCOPY (EGD);  Surgeon: Manuel Feil, MD;  Location: [facility];  Service: Endoscopy;  Laterality: N/A;  . Colonoscopy  06/20/2012    Procedure: COLONOSCOPY;  Surgeon: Manuel Feil, MD;  Location: [facility];  Service: Endoscopy;  Laterality: N/A;     Family History  Problem Relation Age of Onset  . Diabetes Brother   . Heart disease Father   . Heart disease Mother   . Throat cancer Paternal Uncle      History   Social History  . Marital Status: Married    Spouse Name: N/A    Number of Children: 4  . Years of Education: N/A   Occupational History  . Retired from [employer]  . Smoking status: Never Smoker   . Smokeless tobacco: Never Used  . Alcohol Use: No  . Drug Use: No  . Sexual Activity: Not on file   Other Topics Concern  . Not on file   Social History  Narrative   Right handed, Married, 4 kids from previous marriage.  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 Caffeine 1 cup daily.     BP 144/80 mmHg  Pulse 65  Ht 5' 6.5" (1.689 m)  Wt 235 lb (106.595 kg)  BMI 37.37 kg/m2  Physical Exam:  obese appearing 81 year old man, NAD HEENT: Unremarkable Neck:  7 cm  JVD, no thyromegally Lymphatics:  No adenopathy Back:  No CVA tenderness Lungs:  Clear with no wheezes HEART:  Regular rate rhythm, no murmurs, no rubs, no clicks Abd:  soft,  obese, positive bowel sounds, no organomegally, no rebound, no guarding Ext:  2 plus pulses, no edema, no cyanosis, no clubbing Skin:  No rashes no nodules Neuro:  CN II through XII intact, motor grossly intact  DEVICE  Normal device function.  See PaceArt for details.   Assess/Plan:

## 2014-09-28 NOTE — Assessment & Plan Note (Signed)
His medtronic DDD ICD is working normally. Will plan to recheck in several months.

## 2014-10-15 DIAGNOSIS — E11339 Type 2 diabetes mellitus with moderate nonproliferative diabetic retinopathy without macular edema: Secondary | ICD-10-CM | POA: Diagnosis not present

## 2014-10-15 DIAGNOSIS — H26491 Other secondary cataract, right eye: Secondary | ICD-10-CM | POA: Diagnosis not present

## 2014-10-15 DIAGNOSIS — E11359 Type 2 diabetes mellitus with proliferative diabetic retinopathy without macular edema: Secondary | ICD-10-CM | POA: Diagnosis not present

## 2014-10-15 LAB — HM DIABETES EYE EXAM

## 2014-10-16 ENCOUNTER — Telehealth: Payer: Self-pay | Admitting: Family

## 2014-10-16 ENCOUNTER — Ambulatory Visit (INDEPENDENT_AMBULATORY_CARE_PROVIDER_SITE_OTHER): Payer: Medicare Other | Admitting: Family

## 2014-10-16 ENCOUNTER — Ambulatory Visit (INDEPENDENT_AMBULATORY_CARE_PROVIDER_SITE_OTHER): Payer: Medicare Other

## 2014-10-16 ENCOUNTER — Encounter: Payer: Self-pay | Admitting: Family

## 2014-10-16 ENCOUNTER — Encounter: Payer: Self-pay | Admitting: *Deleted

## 2014-10-16 ENCOUNTER — Telehealth: Payer: Self-pay | Admitting: Family Medicine

## 2014-10-16 VITALS — BP 150/87 | HR 80 | Temp 96.7°F | Ht 66.5 in | Wt 227.4 lb

## 2014-10-16 DIAGNOSIS — R748 Abnormal levels of other serum enzymes: Secondary | ICD-10-CM

## 2014-10-16 DIAGNOSIS — M25561 Pain in right knee: Secondary | ICD-10-CM

## 2014-10-16 DIAGNOSIS — R55 Syncope and collapse: Secondary | ICD-10-CM | POA: Diagnosis not present

## 2014-10-16 DIAGNOSIS — R7989 Other specified abnormal findings of blood chemistry: Secondary | ICD-10-CM

## 2014-10-16 MED ORDER — TRAMADOL HCL 50 MG PO TABS: 50.0000 mg | ORAL_TABLET | Freq: Three times a day (TID) | ORAL | Status: DC | PRN

## 2014-10-16 NOTE — Progress Notes (Signed)
   Subjective:    Patient ID: [facility], male    DOB: 1932/03/12, 81 y.o.   MRN: [id number]  Pt states last night he was feeling nauseous and weak. Pt he "spit up" several times last night. Pt states he got out of bed last night and then "saw a bright light and passed out". Pt states he then got up and "passed out again in the kitchen".   Knee Pain  The incident occurred 6 to 12 hours ago. The incident occurred at home. The injury mechanism was a fall. The pain is present in the right knee. The pain is at a severity of 9/10. The pain is moderate. Pertinent negatives include no loss of motion, numbness or tingling. Inability to bear weight: pt states "I can barely walk on it" He reports no foreign bodies present. Nothing aggravates the symptoms. He has tried nothing for the symptoms. The treatment provided no relief.      Review of Systems  Constitutional: Negative.   HENT: Negative.   Respiratory: Negative.   Cardiovascular: Negative.   Gastrointestinal: Negative.   Endocrine: Negative.   Genitourinary: Negative.   Musculoskeletal: Negative.   Neurological: Negative.  Negative for tingling and numbness.  Hematological: Negative.   Psychiatric/Behavioral: Negative.   All other systems reviewed and are negative.      Objective:   Physical Exam  Constitutional: He is oriented to person, place, and time. He appears well-developed and well-nourished. No distress.  HENT:  Head: Normocephalic.  Right Ear: External ear normal.  Left Ear: External ear normal.  Nose: Nose normal.  Mouth/Throat: Oropharynx is clear and moist.  Eyes: Pupils are equal, round, and reactive to light. Right eye exhibits no discharge. Left eye exhibits no discharge.  Neck: Normal range of motion. Neck supple. No thyromegaly present.  Cardiovascular: Normal rate, regular rhythm, normal heart sounds and intact distal pulses.   No murmur heard. Pulmonary/Chest: Effort normal and breath sounds normal. No  respiratory distress. He has no wheezes.  Pt has pacemaker on left side of chest   Abdominal: Soft. Bowel sounds are normal. He exhibits no distension. There is no tenderness.  Musculoskeletal: Normal range of motion. He exhibits no edema or tenderness.  Neurological: He is alert and oriented to person, place, and time. He has normal reflexes. No cranial nerve deficit.  Skin: Skin is warm and dry. No rash noted. No erythema.  Psychiatric: He has a normal mood and affect. His behavior is normal. Judgment and thought content normal.  Vitals reviewed.   BP 150/87 mmHg  Pulse 80  Temp(Src) 96.7 F (35.9 C) (Oral)  Ht 5' 6.5" (1.689 m)  Wt 227 lb 6.4 oz (103.148 kg)  BMI 36.16 kg/m2  Right knee x-ray- [facility] Preliminary reading by Evelina Dun, FNP [facility]      Assessment & Plan:  1. Right knee pain -Rest -Ice  -Elevation  - DG Knee 1-2 Views Right; Future - CMP14+EGFR - traMADol (ULTRAM) 50 MG tablet; Take 1 tablet (50 mg total) by mouth every 8 (eight) hours as needed.  Dispense: 30 tablet; Refill: 1  2. Syncope, unspecified syncope type -Falls precaution discussed -Discussed counting to 5 before standing - [place], FNP

## 2014-10-16 NOTE — Telephone Encounter (Signed)
Appointment given for 9:50 with Evelina Dun, FNP

## 2014-10-16 NOTE — Patient Instructions (Signed)
Syncope Syncope is a medical term for fainting or passing out. This means you lose consciousness and drop to the ground. People are generally unconscious for less than 5 minutes. You may have some muscle twitches for up to 15 seconds before waking up and returning to normal. Syncope occurs more often in older adults, but it can happen to anyone. While most causes of syncope are not dangerous, syncope can be a sign of a serious medical problem. It is important to seek medical care.  CAUSES  Syncope is caused by a sudden drop in blood flow to the brain. The specific cause is often not determined. Factors that can bring on syncope include:  Taking medicines that lower blood pressure.  Sudden changes in posture, such as standing up quickly.  Taking more medicine than prescribed.  Standing in one place for too long.  Seizure disorders.  Dehydration and excessive exposure to heat.  Low blood sugar (hypoglycemia).  Straining to have a bowel movement.  Heart disease, irregular heartbeat, or other circulatory problems.  Fear, emotional distress, seeing blood, or severe pain. SYMPTOMS  Right before fainting, you may:  Feel dizzy or light-headed.  Feel nauseous.  See all white or all black in your field of vision.  Have cold, clammy skin. DIAGNOSIS  Your health care provider will ask about your symptoms, perform a physical exam, and perform an electrocardiogram (ECG) to record the electrical activity of your heart. Your health care provider may also perform other heart or blood tests to determine the cause of your syncope which may include:  Transthoracic echocardiogram (TTE). During echocardiography, sound waves are used to evaluate how blood flows through your heart.  Transesophageal echocardiogram (TEE).  Cardiac monitoring. This allows your health care provider to monitor your heart rate and rhythm in real time.  Holter monitor. This is a portable device that records your  heartbeat and can help diagnose heart arrhythmias. It allows your health care provider to track your heart activity for several days, if needed.  Stress tests by exercise or by giving medicine that makes the heart beat faster. TREATMENT  In most cases, no treatment is needed. Depending on the cause of your syncope, your health care provider may recommend changing or stopping some of your medicines. HOME CARE INSTRUCTIONS  Have someone stay with you until you feel stable.  Do not drive, use machinery, or play sports until your health care provider says it is okay.  Keep all follow-up appointments as directed by your health care provider.  Lie down right away if you start feeling like you might faint. Breathe deeply and steadily. Wait until all the symptoms have passed.  Drink enough fluids to keep your urine clear or pale yellow.  If you are taking blood pressure or heart medicine, get up slowly and take several minutes to sit and then stand. This can reduce dizziness. SEEK IMMEDIATE MEDICAL CARE IF:   You have a severe headache.  You have unusual pain in the chest, abdomen, or back.  You are bleeding from your mouth or rectum, or you have black or tarry stool.  You have an irregular or very fast heartbeat.  You have pain with breathing.  You have repeated fainting or seizure-like jerking during an episode.  You faint when sitting or lying down.  You have confusion.  You have trouble walking.  You have severe weakness.  You have vision problems. If you fainted, call your local emergency services (911 in [place] Do not drive   yourself to the hospital.  MAKE SURE YOU:  Understand these instructions.  Will watch your condition.  Will get help right away if you are not doing well or get worse. Document Released: 08/17/2005 Document Revised: 08/22/2013 Document Reviewed: 10/16/2011 [facility] Patient Information 2015 [place], [place]. This information is not intended to replace  advice given to you by your health care provider. Make sure you discuss any questions you have with your health care provider. Knee Pain The knee is the complex joint between your thigh and your lower leg. It is made up of bones, tendons, ligaments, and cartilage. The bones that make up the knee are:  The femur in the thigh.  The tibia and fibula in the lower leg.  The patella or kneecap riding in the groove on the lower femur. CAUSES  Knee pain is a common complaint with many causes. A few of these causes are:  Injury, such as:  A ruptured ligament or tendon injury.  Torn cartilage.  Medical conditions, such as:  Gout  Arthritis  Infections  Overuse, over training, or overdoing a physical activity. Knee pain can be minor or severe. Knee pain can accompany debilitating injury. Minor knee problems often respond well to self-care measures or get well on their own. More serious injuries may need medical intervention or even surgery. SYMPTOMS The knee is complex. Symptoms of knee problems can vary widely. Some of the problems are:  Pain with movement and weight bearing.  Swelling and tenderness.  Buckling of the knee.  Inability to straighten or extend your knee.  Your knee locks and you cannot straighten it.  Warmth and redness with pain and fever.  Deformity or dislocation of the kneecap. DIAGNOSIS  Determining what is wrong may be very straight forward such as when there is an injury. It can also be challenging because of the complexity of the knee. Tests to make a diagnosis may include:  Your caregiver taking a history and doing a physical exam.  Routine X-rays can be used to rule out other problems. X-rays will not reveal a cartilage tear. Some injuries of the knee can be diagnosed by:  Arthroscopy a surgical technique by which a small video camera is inserted through tiny incisions on the sides of the knee. This procedure is used to examine and repair internal  knee joint problems. Tiny instruments can be used during arthroscopy to repair the torn knee cartilage (meniscus).  Arthrography is a radiology technique. A contrast liquid is directly injected into the knee joint. Internal structures of the knee joint then become visible on X-ray film.  An MRI scan is a non X-ray radiology procedure in which magnetic fields and a computer produce two- or three-dimensional images of the inside of the knee. Cartilage tears are often visible using an MRI scanner. MRI scans have largely replaced arthrography in diagnosing cartilage tears of the knee.  Blood work.  Examination of the fluid that helps to lubricate the knee joint (synovial fluid). This is done by taking a sample out using a needle and a syringe. TREATMENT The treatment of knee problems depends on the cause. Some of these treatments are:  Depending on the injury, proper casting, splinting, surgery, or physical therapy care will be needed.  Give yourself adequate recovery time. Do not overuse your joints. If you begin to get sore during workout routines, back off. Slow down or do fewer repetitions.  For repetitive activities such as cycling or running, maintain your strength and nutrition.  Alternate  muscle groups. For example, if you are a weight lifter, work the upper body on one day and the lower body the next.  Either tight or weak muscles do not give the proper support for your knee. Tight or weak muscles do not absorb the stress placed on the knee joint. Keep the muscles surrounding the knee strong.  Take care of mechanical problems.  If you have flat feet, orthotics or special shoes may help. See your caregiver if you need help.  Arch supports, sometimes with wedges on the inner or outer aspect of the heel, can help. These can shift pressure away from the side of the knee most bothered by osteoarthritis.  A brace called an "unloader" brace also may be used to help ease the pressure on the  most arthritic side of the knee.  If your caregiver has prescribed crutches, braces, wraps or ice, use as directed. The acronym for this is Mccready. This means protection, rest, ice, compression, and elevation.  Nonsteroidal anti-inflammatory drugs (NSAIDs), can help relieve pain. But if taken immediately after an injury, they may actually increase swelling. Take NSAIDs with food in your stomach. Stop them if you develop stomach problems. Do not take these if you have a history of ulcers, stomach pain, or bleeding from the bowel. Do not take without your caregiver's approval if you have problems with fluid retention, heart failure, or kidney problems.  For ongoing knee problems, physical therapy may be helpful.  Glucosamine and chondroitin are over-the-counter dietary supplements. Both may help relieve the pain of osteoarthritis in the knee. These medicines are different from the usual anti-inflammatory drugs. Glucosamine may decrease the rate of cartilage destruction.  Injections of a corticosteroid drug into your knee joint may help reduce the symptoms of an arthritis flare-up. They may provide pain relief that lasts a few months. You may have to wait a few months between injections. The injections do have a small increased risk of infection, water retention, and elevated blood sugar levels.  Hyaluronic acid injected into damaged joints may ease pain and provide lubrication. These injections may work by reducing inflammation. A series of shots may give relief for as long as 6 months.  Topical painkillers. Applying certain ointments to your skin may help relieve the pain and stiffness of osteoarthritis. Ask your pharmacist for suggestions. Many over the-counter products are approved for temporary relief of arthritis pain.  In some countries, doctors often prescribe topical NSAIDs for relief of chronic conditions such as arthritis and tendinitis. A review of treatment with NSAID creams found that they  worked as well as oral medications but without the serious side effects. PREVENTION  Maintain a healthy weight. Extra pounds put more strain on your joints.  Get strong, stay limber. Weak muscles are a common cause of knee injuries. Stretching is important. Include flexibility exercises in your workouts.  Be smart about exercise. If you have osteoarthritis, chronic knee pain or recurring injuries, you may need to change the way you exercise. This does not mean you have to stop being active. If your knees ache after jogging or playing basketball, consider switching to swimming, water aerobics, or other low-impact activities, at least for a few days a week. Sometimes limiting high-impact activities will provide relief.  Make sure your shoes fit well. Choose footwear that is right for your sport.  Protect your knees. Use the proper gear for knee-sensitive activities. Use kneepads when playing volleyball or laying carpet. Buckle your seat belt every time you drive.  Most shattered kneecaps occur in car accidents.  Rest when you are tired. SEEK MEDICAL CARE IF:  You have knee pain that is continual and does not seem to be getting better.  SEEK IMMEDIATE MEDICAL CARE IF:  Your knee joint feels hot to the touch and you have a high fever. MAKE SURE YOU:   Understand these instructions.  Will watch your condition.  Will get help right away if you are not doing well or get worse. Document Released: 06/14/2007 Document Revised: 11/09/2011 Document Reviewed: 06/14/2007 [facility] Patient Information 2015 [place], [place]. This information is not intended to replace advice given to you by your health care provider. Make sure you discuss any questions you have with your health care provider.

## 2014-10-17 ENCOUNTER — Other Ambulatory Visit: Payer: Medicare Other

## 2014-10-17 ENCOUNTER — Encounter: Payer: Self-pay | Admitting: Neurology

## 2014-10-17 DIAGNOSIS — R55 Syncope and collapse: Secondary | ICD-10-CM | POA: Diagnosis not present

## 2014-10-17 DIAGNOSIS — M25561 Pain in right knee: Secondary | ICD-10-CM | POA: Diagnosis not present

## 2014-10-17 DIAGNOSIS — G4733 Obstructive sleep apnea (adult) (pediatric): Secondary | ICD-10-CM | POA: Diagnosis not present

## 2014-10-17 DIAGNOSIS — M25569 Pain in unspecified knee: Secondary | ICD-10-CM | POA: Diagnosis not present

## 2014-10-17 NOTE — Progress Notes (Signed)
Lab only from 10/16/2014

## 2014-10-18 ENCOUNTER — Other Ambulatory Visit: Payer: Self-pay | Admitting: Family

## 2014-10-18 ENCOUNTER — Telehealth: Payer: Self-pay | Admitting: Family

## 2014-10-18 DIAGNOSIS — I255 Ischemic cardiomyopathy: Secondary | ICD-10-CM | POA: Diagnosis not present

## 2014-10-18 DIAGNOSIS — R55 Syncope and collapse: Secondary | ICD-10-CM | POA: Diagnosis not present

## 2014-10-18 DIAGNOSIS — I2510 Atherosclerotic heart disease of native coronary artery without angina pectoris: Secondary | ICD-10-CM | POA: Diagnosis not present

## 2014-10-18 DIAGNOSIS — G4733 Obstructive sleep apnea (adult) (pediatric): Secondary | ICD-10-CM | POA: Diagnosis not present

## 2014-10-18 DIAGNOSIS — I5022 Chronic systolic (congestive) heart failure: Secondary | ICD-10-CM | POA: Diagnosis not present

## 2014-10-18 LAB — CMP14+EGFR
ALBUMIN: 4.5 g/dL (ref 3.5–4.7)
ALK PHOS: 102 IU/L (ref 39–117)
ALT: 12 IU/L (ref 0–44)
AST: 18 IU/L (ref 0–40)
Albumin/Globulin Ratio: 1.9 (ref 1.1–2.5)
BUN/Creatinine Ratio: 13 (ref 10–22)
BUN: 26 mg/dL (ref 8–27)
Bilirubin Total: 0.9 mg/dL (ref 0.0–1.2)
CALCIUM: 9.5 mg/dL (ref 8.6–10.2)
CHLORIDE: 97 mmol/L (ref 97–108)
CO2: 24 mmol/L (ref 18–29)
Creatinine, Ser: 2.07 mg/dL — ABNORMAL HIGH (ref 0.76–1.27)
GFR, EST AFRICAN AMERICAN: 34 mL/min/{1.73_m2} — AB (ref >59)
GFR, EST NON AFRICAN AMERICAN: 29 mL/min/{1.73_m2} — AB (ref >59)
GLOBULIN, TOTAL: 2.4 g/dL (ref 1.5–4.5)
Glucose: 108 mg/dL — ABNORMAL HIGH (ref 65–99)
Potassium: 4.6 mmol/L (ref 3.5–5.2)
Sodium: 140 mmol/L (ref 134–144)
TOTAL PROTEIN: 6.9 g/dL (ref 6.0–8.5)

## 2014-10-18 NOTE — Telephone Encounter (Signed)
-----   Message from Sharion Balloon, [place] sent at 10/18/2014  8:40 AM EST ----- Liver function stable Creatinine increased- No NSAID's- Pt needs to keep appts with Nephrologists

## 2014-10-19 ENCOUNTER — Telehealth: Payer: Self-pay | Admitting: Family Medicine

## 2014-10-19 NOTE — Telephone Encounter (Signed)
Pt given appt with Dr.Miller 2/26 at 10:30.

## 2014-10-22 NOTE — Addendum Note (Signed)
Addended by: Evelina Dun A on: 10/22/2014 12:29 PM   Modules accepted: Orders

## 2014-10-26 ENCOUNTER — Ambulatory Visit (INDEPENDENT_AMBULATORY_CARE_PROVIDER_SITE_OTHER): Payer: Medicare Other | Admitting: Family Medicine

## 2014-10-26 ENCOUNTER — Encounter: Payer: Self-pay | Admitting: Family Medicine

## 2014-10-26 VITALS — BP 157/75 | HR 64 | Temp 97.0°F | Ht 66.5 in | Wt 230.0 lb

## 2014-10-26 DIAGNOSIS — Z951 Presence of aortocoronary bypass graft: Secondary | ICD-10-CM

## 2014-10-26 DIAGNOSIS — E1140 Type 2 diabetes mellitus with diabetic neuropathy, unspecified: Secondary | ICD-10-CM | POA: Diagnosis not present

## 2014-10-26 NOTE — Progress Notes (Signed)
   Subjective:    Patient ID: Manuel Kline, male    DOB: October 14, 1931, 81 y.o.   MRN: [id number]  HPI    Review of Systems  Patient Active Problem List   Diagnosis Date Noted  . Sleep apnea in adult   . CAD (coronary artery disease)   . Hypertensive heart disease without CHF   . Dual implantable cardioverter-defibrillator in situ   . Sleep apnea   . Hyperlipidemia   . Type 2 diabetes, controlled, with neuropathy   . Morbid obesity   . Ischemic cardiomyopathy   . Chronic systolic heart failure   . S/P CABG (coronary artery bypass graft) 11/02/2000   Outpatient Encounter Prescriptions as of 10/26/2014  Medication Sig  . allopurinol (ZYLOPRIM) 100 MG tablet Take 100 mg by mouth 2 (two) times daily.  Marland Kitchen aspirin 81 MG tablet Take 81 mg by mouth daily.   Marland Kitchen atorvastatin (LIPITOR) 40 MG tablet Take 40 mg by mouth daily.  . cloNIDine (CATAPRES) 0.1 MG tablet Take 0.1 mg by mouth 2 (two) times daily.  . furosemide (LASIX) 40 MG tablet Take 40 mg by mouth daily.  Marland Kitchen gabapentin (NEURONTIN) 100 MG capsule Take 2 capsules by mouth at bedtime.   Marland Kitchen glipiZIDE (GLUCOTROL) 10 MG tablet Take 10 mg by mouth daily.  Marland Kitchen glucose blood (ACCU-CHEK AVIVA) test strip 1 each by Other route as needed. Use as instructed  . insulin aspart (NOVOLOG) 100 UNIT/ML injection Inject into the skin as directed.  . insulin glargine (LANTUS SOLOSTAR) 100 UNIT/ML injection Inject 80 Units into the skin at bedtime. Pt states he has been injecting 40 units into the skin twice a day (09/28/14)  . KLOR-CON M20 20 MEQ tablet   . metFORMIN (GLUCOPHAGE) 1000 MG tablet Take 500 mg by mouth 2 (two) times daily with a meal.  . metoprolol tartrate (LOPRESSOR) 25 MG tablet Take 25 mg by mouth 2 (two) times daily.  . Multiple Vitamin (MULTIVITAMIN) tablet Take 1 tablet by mouth daily. Eye formula  . Omega-3 Fatty Acids (FISH OIL) 1000 MG CPDR Take 1 tablet by mouth daily.  Marland Kitchen omeprazole (PRILOSEC) 20 MG capsule Take 20 mg by mouth daily.  .  quinapril (ACCUPRIL) 40 MG tablet Take 40 mg by mouth at bedtime.  . traMADol (ULTRAM) 50 MG tablet Take 1 tablet (50 mg total) by mouth every 8 (eight) hours as needed.  . [DISCONTINUED] Liraglutide (VICTOZA ) Inject into the skin.      Objective:   Physical Exam        Assessment & Plan:

## 2014-10-26 NOTE — Progress Notes (Signed)
Subjective:    Patient ID: Manuel Kline, male    DOB: 1932-03-24, 81 y.o.   MRN: [id number]  HPI  81 year old gentleman who is here today for reasons that are unclear. He's had recent syncopal episodes. His implantable device cardioverter defibrillator was checked last week about a cardiologist by his history and deemed to be okay and he indicated that it had not activated recently. He has diabetes and and is on multiple medicines and also has chronic kidney disease stage III or 4. Today he is convinced that most of his problems are related to his medications and expresses desire to see if we can discontinue some of the meds. He is on both long-acting and short-acting insulin as well as metformin Victoza and glipizide. We mutually decided to discontinue the oral diabetic medicines and treat his diabetes with insulin. In addition he is on a statin for cholesterol and there is some evidence that statins in geriatrics may do more harm than good and will give him a trial stop of statin to see if some of his aches and pains improve. I do think given his history that may have his hip and knee problems are more degenerative in nature and related to myalgias atorvastatin may be causing He also complains of left lower quadrant pain with increased gas. His wife says there is a history of diverticular disease and with recent complaints of constipation it would seem likely that there is a flareup of his diverticular problem.  Patient Active Problem List   Diagnosis Date Noted  . Sleep apnea in adult   . CAD (coronary artery disease)   . Hypertensive heart disease without CHF   . Dual implantable cardioverter-defibrillator in situ   . Sleep apnea   . Hyperlipidemia   . Type 2 diabetes, controlled, with neuropathy   . Morbid obesity   . Ischemic cardiomyopathy   . Chronic systolic heart failure   . S/P CABG (coronary artery bypass graft) 11/02/2000   Outpatient Encounter Prescriptions as of 10/26/2014    Medication Sig  . allopurinol (ZYLOPRIM) 100 MG tablet Take 100 mg by mouth 2 (two) times daily.  Marland Kitchen aspirin 81 MG tablet Take 81 mg by mouth daily.   Marland Kitchen atorvastatin (LIPITOR) 40 MG tablet Take 40 mg by mouth daily.  . cloNIDine (CATAPRES) 0.1 MG tablet Take 0.1 mg by mouth 2 (two) times daily.  . furosemide (LASIX) 40 MG tablet Take 40 mg by mouth daily.  Marland Kitchen gabapentin (NEURONTIN) 100 MG capsule Take 2 capsules by mouth at bedtime.   Marland Kitchen glipiZIDE (GLUCOTROL) 10 MG tablet Take 10 mg by mouth daily.  Marland Kitchen glucose blood (ACCU-CHEK AVIVA) test strip 1 each by Other route as needed. Use as instructed  . insulin aspart (NOVOLOG) 100 UNIT/ML injection Inject into the skin as directed.  . insulin glargine (LANTUS SOLOSTAR) 100 UNIT/ML injection Inject 80 Units into the skin at bedtime. Pt states he has been injecting 40 units into the skin twice a day (09/28/14)  . KLOR-CON M20 20 MEQ tablet   . metFORMIN (GLUCOPHAGE) 1000 MG tablet Take 500 mg by mouth 2 (two) times daily with a meal.  . metoprolol tartrate (LOPRESSOR) 25 MG tablet Take 25 mg by mouth 2 (two) times daily.  . Multiple Vitamin (MULTIVITAMIN) tablet Take 1 tablet by mouth daily. Eye formula  . Omega-3 Fatty Acids (FISH OIL) 1000 MG CPDR Take 1 tablet by mouth daily.  Marland Kitchen omeprazole (PRILOSEC) 20 MG capsule Take 20 mg  by mouth daily.  . quinapril (ACCUPRIL) 40 MG tablet Take 40 mg by mouth at bedtime.  . traMADol (ULTRAM) 50 MG tablet Take 1 tablet (50 mg total) by mouth every 8 (eight) hours as needed.  . [DISCONTINUED] Liraglutide (VICTOZA [place]) Inject into the skin.     Review of Systems  Respiratory: Negative.   Cardiovascular: Negative.   Gastrointestinal: Positive for abdominal pain.  Musculoskeletal: Positive for joint swelling.  Neurological: Positive for syncope.       Objective:   Physical Exam  Constitutional: He is oriented to person, place, and time. He appears well-developed and well-nourished.  Cardiovascular: Normal  rate and regular rhythm.   Pulmonary/Chest: Effort normal and breath sounds normal.  Abdominal: Soft. He exhibits no mass. There is no tenderness. There is no guarding.  Musculoskeletal: Normal range of motion.  Neurological: He is alert and oriented to person, place, and time.    BP 157/75 mmHg  Pulse 64  Temp(Src) 97 F (36.1 C) (Oral)  Ht 5' 6.5" (1.689 m)  Wt 230 lb (104.327 kg)  BMI 36.57 kg/m2       Assessment & Plan:  1. Type 2 diabetes, controlled, with neuropathy As stated above will discontinue the oral medications at his request and try to manage his diabetes with insulin which should be a doable issue. Given his elevated creatinine I think metformin discontinuation is a good idea in with a syncopal episodes in the oral sulfonylureas it's reasonable to stop that as well.  2. S/P CABG (coronary artery bypass graft) We'll try him for a trial period off the statin to see if maybe some of his aches and pains improve. We could try him on Livalo if he needs a statin.    [name] MD

## 2014-11-05 ENCOUNTER — Ambulatory Visit (INDEPENDENT_AMBULATORY_CARE_PROVIDER_SITE_OTHER): Payer: Medicare Other | Admitting: Neurology

## 2014-11-05 ENCOUNTER — Encounter: Payer: Self-pay | Admitting: Neurology

## 2014-11-05 VITALS — BP 141/72 | HR 61 | Resp 16 | Ht 66.0 in | Wt 231.4 lb

## 2014-11-05 DIAGNOSIS — N183 Chronic kidney disease, stage 3 unspecified: Secondary | ICD-10-CM | POA: Insufficient documentation

## 2014-11-05 DIAGNOSIS — Z9989 Dependence on other enabling machines and devices: Principal | ICD-10-CM

## 2014-11-05 DIAGNOSIS — G4733 Obstructive sleep apnea (adult) (pediatric): Secondary | ICD-10-CM

## 2014-11-05 HISTORY — DX: Dependence on other enabling machines and devices: Z99.89

## 2014-11-05 HISTORY — DX: Obstructive sleep apnea (adult) (pediatric): G47.33

## 2014-11-05 NOTE — Progress Notes (Signed)
Chief Complaint  Patient presents with  . RV cpap and syncope episodes    Rm 10, alone (wife in lobby)     [place]   Provider:  Larey Seat, M D  Referring Provider: [name], MD Primary Care Physician:  [name], MD  Chief Complaint  Patient presents with  . RV cpap and syncope episodes    Rm 10, alone (wife in lobby)    HPI:  Manuel Kline is a 81 y.o. male ,seen here as a referral  from Manuel Kline  at [facility],   The patient is also followed by Dr. Elyse Kline and cardiologist Manuel Eth, MD.    Last visit note : 2015 [name] was last seen 7-1/2 years ago in my practice, at that time a patient of Dr Edrick Oh.  At the time he carried the diagnosis of a complicated or so-called complex sleep apnea syndrome. The patient was placed on a VPAP because of his cardiac history including cardiomyopathy, coronary artery disease, congestive heart failure and he has a defibrillator implanted. His residual apnea index was 7.5 his sleep study in 2007 diagnosed him with an AHI of 77 apneas per hour of sleep. The patient had meanwhile felt that the machine did not longer benefit him or that it actually multifunctioned. He has not been using it since 2012, but never contacted [place] or the DME. He is here today to see if he actually still needs apnea treatment and if his condition is still present.  He is here today to find out  what the best treatment would be should be still find apnea to be present. His wife is here today with him, stating that the patient kicks a lot at night that he seems not to be waking from it. The patient watches every night TV late, and goes to bed at about 11.30- will promptly fall asleep. He snores thunderously, he will sleep supine due to a shoulder injury. His wife has noted short pauses in breathing , followed by a leg kick.  He has one bathroom break at 4 AM. He rises at 7.30 , is woken by his wife. He  feels refreshed. He has slept several night in a recliner when he has coughing and  "Chest congestion".  He had a tonsillectomy in childhood.  No facial or neck surgeries. The patient endorsed today a high degree of fatigue, SOB with minimal exertion, Fatigue degree 54 points, Epworth 8 , GDS zero points.      11-05-14;  interval history : [name] is seen here today for his interval history. He had undergone a repeat sleep study which confirmed obstructive sleep apnea still to be present. The patient has meanwhile learned that he has chronic kidney disease grade 3 and his medications have been changed to eliminate those that burden the kidney. His sleep study took place on 08-09-14. The patient had an AHI of 54.8 oxygen nadir was 81% with 67.6 minutes of desaturation. The patient was split to a Pap pressure of 10 cm water under which his AHI became 0.0. His machine has been reset to this new pressure. I would like to add that his apnea was positional dependent and that he was placed on an Eason nasal mask by [employer] in medium size, which he likes better.    he is a CPAP user has been very compliant and has benefited greatly. He had one current events that is  of concern. On 10-16-14 he had a spell at to a fall. He felt extremely nauseated and weak he then spent up several times throughout the night and when he finally tried to get out of bed he felt as being hit by a bright light and he passed out on the floor when he got up he passed out again this time in the kitchen. He was found to be dehydrated and in chronic renal insufficiency chronic kidney disease grade 3. He was referred he states to a local nephrologist Dr. Antionette Fairy  ( ?). He carries the diagnoses of right knee pain, syncope, and obstructive sleep apnea. CPAP compliance.  Manuel Kline has again 100% compliance for the last 30 days of CPAP use dated 11-01-14 he used to machine 29 out of 30 days over 4 hours at night making it at 97%  compliance for time of use. On average he uses the machine at night 7 hours and 27 minutes. The set pressure is 10 cm water with 3 cm EPR his residual AHI is 0.5. Today's Epworth sleepiness score was endorsed at 8 points and his fatigue severity score is 12 points.  From a sleep standpoint,  the patient is doing excellent.  The patient is followed by [facility] , No; [phone].    Review of Systems: Out of a complete 14 system review, the patient complains of only the following symptoms, and all other reviewed systems are negative. The patient endorsed today a high degree of fatigue, SOB with minimal exertion, Fatigue degree 12 from 54 points, Epworth 8 , GDS zero points.     History   Social History  . Marital Status: Married    Spouse Name: N/A  . Number of Children: 4  . Years of Education: N/A   Occupational History  . Retired from [employer]  . Smoking status: Never Smoker   . Smokeless tobacco: Never Used  . Alcohol Use: No  . Drug Use: No  . Sexual Activity: Not on file   Other Topics Concern  . Not on file   Social History Narrative   Right handed, Married, 4 kids from previous marriage.  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 Caffeine 1 cup daily.    Family History  Problem Relation Age of Onset  . Diabetes Brother   . Heart disease Father   . Heart disease Mother   . Throat cancer Paternal Uncle     Past Medical History  Diagnosis Date  . CAD (coronary artery disease)   . S/P CABG (coronary artery bypass graft) 11/02/2000  . Hypertensive heart disease without CHF 07/31/2011  . ICD dual chamber in situ   . Sleep apnea   . Morbid obesity   . Erosive esophagitis   . Cardiomyopathy   . Erectile dysfunction   . Metabolic syndrome   . Osteoarthritis   . Calcium oxalate renal stones   . Adenomatous colon polyp 2006  . Hemorrhoids   . Diabetes   . HTN (hypertension)   . HLD (hyperlipidemia)   . ICD (implantable cardiac  defibrillator) in place     Past Surgical History  Procedure Laterality Date  . Doppler echocardiography  2003  . Back surgery      X'3  . Rotator cuff surgery      left  . Carpal tunnel release      X2, bilateral  . Abdominal exploration surgery    . Laparotomy    .  Cardiac bypass    . Cardiac defibrillator placement    . Esophagogastroduodenoscopy  06/20/2012    Procedure: ESOPHAGOGASTRODUODENOSCOPY (EGD);  Surgeon: Sable Feil, MD;  Location: [facility];  Service: Endoscopy;  Laterality: N/A;  . Colonoscopy  06/20/2012    Procedure: COLONOSCOPY;  Surgeon: Sable Feil, MD;  Location: [facility];  Service: Endoscopy;  Laterality: N/A;    Current Outpatient Prescriptions  Medication Sig Dispense Refill  . allopurinol (ZYLOPRIM) 100 MG tablet Take 100 mg by mouth 2 (two) times daily.    Marland Kitchen aspirin 81 MG tablet Take 81 mg by mouth daily.     . cloNIDine (CATAPRES) 0.1 MG tablet Take 0.1 mg by mouth 2 (two) times daily.    . furosemide (LASIX) 40 MG tablet Take 40 mg by mouth daily.    Marland Kitchen gabapentin (NEURONTIN) 100 MG capsule Take 2 capsules by mouth at bedtime.     Marland Kitchen glucose blood (ACCU-CHEK AVIVA) test strip 1 each by Other route as needed. Use as instructed    . insulin aspart (NOVOLOG) 100 UNIT/ML injection Inject into the skin as directed.    . insulin glargine (LANTUS SOLOSTAR) 100 UNIT/ML injection Inject 80 Units into the skin at bedtime. Pt states he has been injecting 40 units into the skin twice a day (09/28/14)    . KLOR-CON M20 20 MEQ tablet     . metoprolol tartrate (LOPRESSOR) 25 MG tablet Take 25 mg by mouth 2 (two) times daily.    . Multiple Vitamin (MULTIVITAMIN) tablet Take 1 tablet by mouth daily. Eye formula    . Omega-3 Fatty Acids (FISH OIL) 1000 MG CPDR Take 1 tablet by mouth daily.    Marland Kitchen omeprazole (PRILOSEC) 20 MG capsule Take 20 mg by mouth daily.    Marland Kitchen omeprazole (PRILOSEC) 20 MG capsule TAKE ONE CAPSULE BY MOUTH TWICE DAILY    . quinapril  (ACCUPRIL) 40 MG tablet Take 40 mg by mouth at bedtime.    . traMADol (ULTRAM) 50 MG tablet Take 1 tablet (50 mg total) by mouth every 8 (eight) hours as needed. 30 tablet 1   No current facility-administered medications for this visit.    Allergies as of 11/05/2014  . (No Known Allergies)    Vitals: BP 141/72 mmHg  Pulse 61  Resp 16  Ht 5\' 6"  (1.676 m)  Wt 231 lb 6.4 oz (104.962 kg)  BMI 37.37 kg/m2 Last Weight:  Wt Readings from Last 1 Encounters:  11/05/14 231 lb 6.4 oz (104.962 kg)       Last Height:   Ht Readings from Last 1 Encounters:  11/05/14 5\' 6"  (1.676 m)    Physical exam:  General: The patient is awake, alert and appears not in acute distress. The patient is well groomed. Head: Normocephalic, atraumatic. Neck is supple. Mallampati 2- lateral narrowing, but  uvula elevated in midline ,  neck circumference: 19 inches . Nasal airflow restricted, TMJ is  not evident . Retrognathia is not seen.  He has delayed swallowing  Cardiovascular:  Regular rate and rhythm , without  murmurs or carotid bruit, and without distended neck veins. Respiratory: Lungs are clear to auscultation. Skin:  Without evidence of edema, or rash Trunk: BMI is  elevated and patient  has normal posture.  Neurologic exam : The patient is awake and alert, oriented to place and time.   Memory subjective  described as intact. There is a normal attention span & concentration ability.  Speech is fluent without  dysarthria, dysphonia or aphasia.  Mood and affect are appropriate.  Cranial nerves: Pupils are reactive to light.  Visual fields by finger perimetry are intact. Hearing loss.   Facial sensation intact to fine touch. Facial motor strength is symmetric and tongue and uvula move midline.  Motor exam:  Normal tone, muscle bulk and symmetric strength in all extremities. Sensory:  Fine touch, pinprick and vibration were tested in all extremities. Proprioception is impaired . Coordination: Rapid  alternating movements in the fingers/hands is slowed . Finger-to-nose maneuver normal without evidence of ataxia, dysmetria or tremor. Gait and station: Patient walks without assistive device and is able unassisted to climb up to the exam table. Strength within normal limits. Stance is stable and normal.  Deep tendon reflexes: in the upper and lower extremities are attenuated . Babinski maneuver response is equivocal    Assessment:  After physical and neurologic examination, review of laboratory studies, imaging, neurophysiology testing and pre-existing records, assessment is that of a patient with significant cardio vascular risk factors and ongoing snoring, high fatigue. Diabetic polyneuropathy, eye disease and vasculopathy, CAD,CHF, defibrillator patient. .  The patient was advised of the nature of the diagnosed sleep disorder , the treatment options and risks for general a health and wellness arising from not treating the condition. Visit duration was 15  minutes.   Plan:  Treatment plan and additional workup :    Larey Seat MD  11/05/2014

## 2014-11-06 ENCOUNTER — Encounter: Payer: Self-pay | Admitting: Neurology

## 2014-11-08 DIAGNOSIS — E119 Type 2 diabetes mellitus without complications: Secondary | ICD-10-CM | POA: Diagnosis not present

## 2014-11-08 DIAGNOSIS — E1122 Type 2 diabetes mellitus with diabetic chronic kidney disease: Secondary | ICD-10-CM | POA: Diagnosis not present

## 2014-11-08 DIAGNOSIS — I255 Ischemic cardiomyopathy: Secondary | ICD-10-CM | POA: Diagnosis not present

## 2014-11-08 DIAGNOSIS — E785 Hyperlipidemia, unspecified: Secondary | ICD-10-CM | POA: Diagnosis not present

## 2014-11-15 DIAGNOSIS — N189 Chronic kidney disease, unspecified: Secondary | ICD-10-CM | POA: Diagnosis not present

## 2014-11-16 DIAGNOSIS — G4733 Obstructive sleep apnea (adult) (pediatric): Secondary | ICD-10-CM | POA: Diagnosis not present

## 2014-12-10 DIAGNOSIS — Z95810 Presence of automatic (implantable) cardiac defibrillator: Secondary | ICD-10-CM | POA: Diagnosis not present

## 2014-12-13 ENCOUNTER — Other Ambulatory Visit: Payer: Self-pay | Admitting: *Deleted

## 2014-12-13 MED ORDER — OMEPRAZOLE 20 MG PO CPDR: 20.0000 mg | DELAYED_RELEASE_CAPSULE | Freq: Two times a day (BID) | ORAL | Status: DC

## 2014-12-14 ENCOUNTER — Ambulatory Visit (INDEPENDENT_AMBULATORY_CARE_PROVIDER_SITE_OTHER): Payer: Medicare Other | Admitting: Podiatrist

## 2014-12-14 ENCOUNTER — Encounter: Payer: Self-pay | Admitting: Podiatrist

## 2014-12-14 VITALS — BP 170/86 | HR 60 | Resp 16

## 2014-12-14 DIAGNOSIS — M79609 Pain in unspecified limb: Secondary | ICD-10-CM

## 2014-12-14 DIAGNOSIS — M216X9 Other acquired deformities of unspecified foot: Secondary | ICD-10-CM | POA: Diagnosis not present

## 2014-12-14 DIAGNOSIS — B351 Tinea unguium: Secondary | ICD-10-CM

## 2014-12-14 DIAGNOSIS — Q667 Congenital pes cavus: Secondary | ICD-10-CM

## 2014-12-14 DIAGNOSIS — E1140 Type 2 diabetes mellitus with diabetic neuropathy, unspecified: Secondary | ICD-10-CM | POA: Diagnosis not present

## 2014-12-14 MED ORDER — GABAPENTIN 300 MG PO CAPS: 300.0000 mg | ORAL_CAPSULE | Freq: Every day | ORAL | Status: DC

## 2014-12-14 NOTE — Patient Instructions (Signed)
DIABETIC SHOES-  Diabetic shoes and accomidative inserts are indicated for those persons who have Diabetes and who are at an increased for a foot ulceration.  This requires patients to have decreased feeling in the feet, circulation problems, as well as an abnormal foot structure, or a combination of these disorders.  As podiatrists, we can recommend a diabetic shoe to your primary care doctor or endocrinologist HOWEVER, it is up to them to decide if you are eligible or in need of these shoes.  Before we can take measurements or do the ordering of a diabetic shoe and accomidative insert, it is required that we get a signed authorization from your physician both confirming you have Diabetes and that you are at risk for an ulceration.  Please note, if you physician will not sign for the shoes, there is no way we can go around him or her.  You are welcome to place an order for the shoes, however the financial responsibility is up to you and your insurance cannot be billed.   Once, approval is granted for the diabetic shoes and inserts we will make you a separate appointment to be measured, casted, and for you to choose the diabetic shoe.  We will then call you when they are ready for dispensing and you will be seen to make sure the shoes and inserts fit your foot properly.  As this is a multi-step process, it generally takes 4-8 weeks from start to finish.  You cooperation is appreciated.  If it has been more than 6 weeks from the date you ordered your diabetic shoe and you have not heard from our office, please give [place] a call.   Diabetes and Foot Care Diabetes may cause you to have problems because of poor blood supply (circulation) to your feet and legs. This may cause the skin on your feet to become thinner, break easier, and heal more slowly. Your skin may become dry, and the skin may peel and crack. You may also have nerve damage in your legs and feet causing decreased feeling in them. You may not notice  minor injuries to your feet that could lead to infections or more serious problems. Taking care of your feet is one of the most important things you can do for yourself.  HOME CARE INSTRUCTIONS  Wear shoes at all times, even in the house. Do not go barefoot. Bare feet are easily injured.  Check your feet daily for blisters, cuts, and redness. If you cannot see the bottom of your feet, use a mirror or ask someone for help.  Wash your feet with warm water (do not use hot water) and mild soap. Then pat your feet and the areas between your toes until they are completely dry. Do not soak your feet as this can dry your skin.  Apply a moisturizing lotion or petroleum jelly (that does not contain alcohol and is unscented) to the skin on your feet and to dry, brittle toenails. Do not apply lotion between your toes.  Trim your toenails straight across. Do not dig under them or around the cuticle. File the edges of your nails with an emery board or nail file.  Do not cut corns or calluses or try to remove them with medicine.  Wear clean socks or stockings every day. Make sure they are not too tight. Do not wear knee-high stockings since they may decrease blood flow to your legs.  Wear shoes that fit properly and have enough cushioning. To break  in new shoes, wear them for just a few hours a day. This prevents you from injuring your feet. Always look in your shoes before you put them on to be sure there are no objects inside.  Do not cross your legs. This may decrease the blood flow to your feet.  If you find a minor scrape, cut, or break in the skin on your feet, keep it and the skin around it clean and dry. These areas may be cleansed with mild soap and water. Do not cleanse the area with peroxide, alcohol, or iodine.  When you remove an adhesive bandage, be sure not to damage the skin around it.  If you have a wound, look at it several times a day to make sure it is healing.  Do not use heating  pads or hot water bottles. They may burn your skin. If you have lost feeling in your feet or legs, you may not know it is happening until it is too late.  Make sure your health care provider performs a complete foot exam at least annually or more often if you have foot problems. Report any cuts, sores, or bruises to your health care provider immediately. SEEK MEDICAL CARE IF:   You have an injury that is not healing.  You have cuts or breaks in the skin.  You have an ingrown nail.  You notice redness on your legs or feet.  You feel burning or tingling in your legs or feet.  You have pain or cramps in your legs and feet.  Your legs or feet are numb.  Your feet always feel cold. SEEK IMMEDIATE MEDICAL CARE IF:   There is increasing redness, swelling, or pain in or around a wound.  There is a red line that goes up your leg.  Pus is coming from a wound.  You develop a fever or as directed by your health care provider.  You notice a bad smell coming from an ulcer or wound. Document Released: 08/14/2000 Document Revised: 04/19/2013 Document Reviewed: 01/24/2013 [facility] Patient Information 2015 [place], [place]. This information is not intended to replace advice given to you by your health care provider. Make sure you discuss any questions you have with your health care provider.  Diabetic Neuropathy Diabetic neuropathy is a nerve disease or nerve damage that is caused by diabetes mellitus. About half of all people with diabetes mellitus have some form of nerve damage. Nerve damage is more common in those who have had diabetes mellitus for many years and who generally have not had good control of their blood sugar (glucose) level. Diabetic neuropathy is a common complication of diabetes mellitus. There are three more common types of diabetic neuropathy and a fourth type that is less common and less understood:   Peripheral neuropathy--This is the most common type of diabetic neuropathy.  It causes damage to the nerves of the feet and legs first and then eventually the hands and arms.The damage affects the ability to sense touch.  Autonomic neuropathy--This type causes damage to the autonomic nervous system, which controls the following functions:  Heartbeat.  Body temperature.  Blood pressure.  Urination.  Digestion.  Sweating.  Sexual function.  Focal neuropathy--Focal neuropathy can be painful and unpredictable and occurs most often in older adults with diabetes mellitus. It involves a specific nerve or one area and often comes on suddenly. It usually does not cause long-term problems.  Radiculoplexus neuropathy-- Sometimes called lumbosacral radiculoplexus neuropathy, radiculoplexus neuropathy affects the nerves of  the thighs, hips, buttocks, or legs. It is more common in people with type 2 diabetes mellitus and in older men. It is characterized by debilitating pain, weakness, and atrophy, usually in the thigh muscles. CAUSES  The cause of peripheral, autonomic, and focal neuropathies is diabetes mellitus that is uncontrolled and high glucose levels. The cause of radiculoplexus neuropathy is unknown. However, it is thought to be caused by inflammation related to uncontrolled glucose levels. SIGNS AND SYMPTOMS  Peripheral Neuropathy Peripheral neuropathy develops slowly over time. When the nerves of the feet and legs no longer work there may be:   Burning, stabbing, or aching pain in the legs or feet.  Inability to feel pressure or pain in your feet. This can lead to:  Thick calluses over pressure areas.  Pressure sores.  Ulcers.  Foot deformities.  Reduced ability to feel temperature changes.  Muscle weakness. Autonomic Neuropathy The symptoms of autonomic neuropathy vary depending on which nerves are affected. Symptoms may include:  Problems with digestion, such as:  Feeling sick to your stomach  (nausea).  Vomiting.  Bloating.  Constipation.  Diarrhea.  Abdominal pain.  Difficulty with urination. This occurs if you lose your ability to sense when your bladder is full. Problems include:  Urine leakage (incontinence).  Inability to empty your bladder completely (retention).  Rapid or irregular heartbeat (palpitations).  Blood pressure drops when you stand up (orthostatic hypotension). When you stand up you may feel:  Dizzy.  Weak.  Faint.  In men, inability to attain and maintain an erection.  In women, vaginal dryness and problems with decreased sexual desire and arousal.  Problems with body temperature regulation.  Increased or decreased sweating. Focal Neuropathy  Abnormal eye movements or abnormal alignment of both eyes.  Weakness in the wrist.  Foot drop. This results in an inability to lift the foot properly and abnormal walking or foot movement.  Paralysis on one side of your face (Bell palsy).  Chest or abdominal pain. Radiculoplexus Neuropathy  Sudden, severe pain in your hip, thigh, or buttocks.  Weakness and wasting of thigh muscles.  Difficulty rising from a seated position.  Abdominal swelling.  Unexplained weight loss (usually more than 10 lb [4.5 kg]). DIAGNOSIS  Peripheral Neuropathy Your senses may be tested. Sensory function testing can be done with:  A light touch using a monofilament.  A vibration with tuning fork.  A sharp sensation with a pin prick. Other tests that can help diagnose neuropathy are:  Nerve conduction velocity. This test checks the transmission of an electrical current through a nerve.  Electromyography. This shows how muscles respond to electrical signals transmitted by nearby nerves.  Quantitative sensory testing. This is used to assess how your nerves respond to vibrations and changes in temperature. Autonomic Neuropathy Diagnosis is often based on reported symptoms. Tell your health care  provider if you experience:   Dizziness.   Constipation.   Diarrhea.   Inappropriate urination or inability to urinate.   Inability to get or maintain an erection.  Tests that may be done include:   Electrocardiography or Holter monitor. These are tests that can help show problems with the heart rate or heart rhythm.   An X-ray exam may be done. Focal Neuropathy Diagnosis is made based on your symptoms and what your health care provider finds during your exam. Other tests may be done. They may include:  Nerve conduction velocities. This checks the transmission of electrical current through a nerve.  Electromyography. This shows how  muscles respond to electrical signals transmitted by nearby nerves.  Quantitative sensory testing. This test is used to assess how your nerves respond to vibration and changes in temperature. Radiculoplexus Neuropathy  Often the first thing is to eliminate any other issue or problems that might be the cause, as there is no stick test for diagnosis.  X-ray exam of your spine and lumbar region.  Spinal tap to rule out cancer.  MRI to rule out other lesions. TREATMENT  Once nerve damage occurs, it cannot be reversed. The goal of treatment is to keep the disease or nerve damage from getting worse and affecting more nerve fibers. Controlling your blood glucose level is the key. Most people with radiculoplexus neuropathy see at least a partial improvement over time. You will need to keep your blood glucose and HbA1c levels in the target range determined by your health care provider. Things that help control blood glucose levels include:   Blood glucose monitoring.   Meal planning.   Physical activity.   Diabetes medicine.  Over time, maintaining lower blood glucose levels helps lessen symptoms. Sometimes, prescription pain medicine is needed. HOME CARE INSTRUCTIONS:  Do not smoke.  Keep your blood glucose level in the range that you and  your health care provider have determined acceptable for you.  Keep your blood pressure level in the range that you and your health care provider have determined acceptable for you.  Eat a well-balanced diet.  Be active every day.  Check your feet every day. SEEK MEDICAL CARE IF:   You have burning, stabbing, or aching pain in the legs or feet.  You are unable to feel pressure or pain in your feet.  You develop problems with digestion such as:  Nausea.  Vomiting.  Bloating.  Constipation.  Diarrhea.  Abdominal pain.  You have difficulty with urination, such as:  Incontinence.  Retention.  You have palpitations.  You develop orthostatic hypotension. When you stand up you may feel:  Dizzy.  Weak.  Faint.  You cannot attain and maintain an erection (in men).  You have vaginal dryness and problems with decreased sexual desire and arousal (in women).  You have severe pain in your thighs, legs, or buttocks.  You have unexplained weight loss. Document Released: 10/26/2001 Document Revised: 06/07/2013 Document Reviewed: 01/26/2013 [facility] Patient Information 2015 [place], [place]. This information is not intended to replace advice given to you by your health care provider. Make sure you discuss any questions you have with your health care provider.

## 2014-12-14 NOTE — Progress Notes (Signed)
   Subjective:    Patient ID: Manuel Kline, male    DOB: 09-21-1931, 81 y.o.   MRN: [id number]  HPI  Pain across at base of the B/L  toes,left great toe hurts were nail was removed several years ago(2000),has not been using anything except lotion which soothes. Is requesting prescription for diabetic shoes.  Review of Systems  HENT:       Sore throat  Genitourinary:       Increased urination  Musculoskeletal:       Joint pain       Objective:   Physical Exam  Patient is awake, alert, and oriented x 3.  In no acute distress.  Vascular status is intact with palpable pedal pulses at 2/4 DP and PT bilateral and capillary refill time within normal limits. Neurological sensation is decreased bilaterally via Semmes Weinstein monofilament at 2/5 sites. Light touch, vibratory sensation, Achilles tendon reflex is decreased. Dermatological exam reveals thickened, discolored, dystrophic and clinically Mycotic Toenails Are Noted. Hyperkeratotic Lesion Sub-Metatarsal 5 Bilateral Is Noted.  Musculature intact with dorsiflexion, plantarflexion, inversion, eversion. Hammertoe second digit bilaterally is noted. Cavus deformity is noted bilateral.          Assessment & Plan:  Neuropathy, second toe hammertoe, porokeratotic lesion sub-metatarsal 5 bilateral, cavus foot deformity  Plan: Debridement of toenails carried out today without complication recommended diabetic shoes as a preventative measure for pre-ulcerative lesions. We'll contact his primary care physician or Dr. Elyse Hsu for approval. Will call when he is ready to be measured for the shoes.

## 2014-12-17 DIAGNOSIS — G4733 Obstructive sleep apnea (adult) (pediatric): Secondary | ICD-10-CM | POA: Diagnosis not present

## 2015-01-02 ENCOUNTER — Telehealth: Payer: Self-pay | Admitting: Podiatrist

## 2015-01-02 NOTE — Telephone Encounter (Signed)
Pt called asking if we have gotten the letter from his pcp for his diabetic shoes. He said las year he never got them because we never followed up.Please call pt and let him know what he needs to do.

## 2015-01-03 NOTE — Telephone Encounter (Signed)
Called pt and told him we have faxed several times to Dr Lonia Chimera several times and he has not responded yet. Patient asked to please send to his PCP Dr Manuel Kline at [facility] instead. We will change paperwork and fax to Dr Sabra Heck to see if we can get authorization for his shoes

## 2015-01-17 ENCOUNTER — Encounter: Payer: Self-pay | Admitting: Gastroenterology

## 2015-01-21 ENCOUNTER — Encounter: Payer: Self-pay | Admitting: Family Medicine

## 2015-01-21 ENCOUNTER — Ambulatory Visit (INDEPENDENT_AMBULATORY_CARE_PROVIDER_SITE_OTHER): Payer: Medicare Other | Admitting: Family Medicine

## 2015-01-21 VITALS — BP 154/69 | HR 60 | Temp 96.5°F | Ht 66.0 in | Wt 231.0 lb

## 2015-01-21 DIAGNOSIS — E785 Hyperlipidemia, unspecified: Secondary | ICD-10-CM

## 2015-01-21 DIAGNOSIS — E1140 Type 2 diabetes mellitus with diabetic neuropathy, unspecified: Secondary | ICD-10-CM

## 2015-01-21 DIAGNOSIS — I119 Hypertensive heart disease without heart failure: Secondary | ICD-10-CM

## 2015-01-21 LAB — POCT GLYCOSYLATED HEMOGLOBIN (HGB A1C): HEMOGLOBIN A1C: 8

## 2015-01-21 MED ORDER — POTASSIUM CHLORIDE CRYS ER 20 MEQ PO TBCR: 20.0000 meq | EXTENDED_RELEASE_TABLET | Freq: Once | ORAL | Status: DC

## 2015-01-21 MED ORDER — INSULIN GLARGINE 100 UNIT/ML ~~LOC~~ SOLN: 80.0000 [IU] | Freq: Every day | SUBCUTANEOUS | Status: DC

## 2015-01-21 MED ORDER — INSULIN ASPART 100 UNIT/ML ~~LOC~~ SOLN: SUBCUTANEOUS | Status: DC

## 2015-01-21 MED ORDER — INSULIN GLARGINE 100 UNIT/ML ~~LOC~~ SOLN: SUBCUTANEOUS | Status: DC

## 2015-01-21 MED ORDER — GLUCOSE BLOOD VI STRP: 1.0000 | ORAL_STRIP | Freq: Four times a day (QID) | Status: DC

## 2015-01-21 MED ORDER — INSULIN LISPRO 100 UNIT/ML ~~LOC~~ SOLN: 24.0000 [IU] | Freq: Three times a day (TID) | SUBCUTANEOUS | Status: DC

## 2015-01-21 MED ORDER — GABAPENTIN 300 MG PO CAPS: 300.0000 mg | ORAL_CAPSULE | Freq: Two times a day (BID) | ORAL | Status: DC

## 2015-01-21 NOTE — Progress Notes (Signed)
Subjective:    Patient ID: Manuel Kline, male    DOB: [birth year], 81 y.o.   MRN: [id number]  HPI  81 year old male here to follow-up diabetes, hyperlipidemia,. At his last visit and stopped several of the oral diabetic meds as well as Lipitor in an effort to streamline and possibly reduce muscle aches and pains. I am not sure if there are any less aches and pains but I will check lipids today to see the effect of stopping. He seems to be taking a lot of insulin including Lantus and NovoLog. A1c is pending.he has not had any further syncopal episodes which I was attributing to hypoglycemia, since stopping the sulfonylurea.  He does complain of foot pain and takes gabapentin 300 mg at bedtime. He is also followed by podiatry  Patient Active Problem List   Diagnosis Date Noted  . CKD (chronic kidney disease) stage 3, GFR 30-59 ml/min 11/05/2014  . OSA on CPAP 11/05/2014  . Severe obesity (BMI >= 40) 11/05/2014  . Sleep apnea in adult   . CAD (coronary artery disease)   . Hypertensive heart disease without CHF   . Dual implantable cardioverter-defibrillator in situ   . Sleep apnea   . Hyperlipidemia   . Type 2 diabetes, controlled, with neuropathy   . Morbid obesity   . Ischemic cardiomyopathy   . Chronic systolic heart failure   . S/P CABG (coronary artery bypass graft) 11/02/2000   Outpatient Encounter Prescriptions as of 01/21/2015  Medication Sig  . allopurinol (ZYLOPRIM) 100 MG tablet Take 100 mg by mouth 2 (two) times daily.  Marland Kitchen aspirin 81 MG tablet Take 81 mg by mouth daily.   . cloNIDine (CATAPRES) 0.1 MG tablet Take 0.1 mg by mouth 2 (two) times daily.  . furosemide (LASIX) 40 MG tablet Take 40 mg by mouth daily.  Marland Kitchen gabapentin (NEURONTIN) 300 MG capsule Take 1 capsule (300 mg total) by mouth at bedtime.  Marland Kitchen glucose blood (ACCU-CHEK AVIVA) test strip 1 each by Other route as needed. Use as instructed  . insulin aspart (NOVOLOG) 100 UNIT/ML injection Inject into the skin as directed.    . insulin glargine (LANTUS SOLOSTAR) 100 UNIT/ML injection Inject 80 Units into the skin at bedtime. Pt states he has been injecting 40 units into the skin twice a day (09/28/14)  . KLOR-CON M20 20 MEQ tablet   . metoprolol (LOPRESSOR) 50 MG tablet Take 50 mg by mouth 2 (two) times daily.  . Multiple Vitamin (MULTIVITAMIN) tablet Take 1 tablet by mouth daily. Eye formula  . Omega-3 Fatty Acids (FISH OIL) 1000 MG CPDR Take 1 tablet by mouth daily.  Marland Kitchen omeprazole (PRILOSEC) 20 MG capsule Take 1 capsule (20 mg total) by mouth 2 (two) times daily before a meal. TAKE ONE CAPSULE BY MOUTH TWICE DAILy  . quinapril (ACCUPRIL) 40 MG tablet Take 40 mg by mouth at bedtime.  . [DISCONTINUED] omeprazole (PRILOSEC) 20 MG capsule Take 20 mg by mouth daily.  . [DISCONTINUED] gabapentin (NEURONTIN) 100 MG capsule Take 2 capsules by mouth at bedtime.   . [DISCONTINUED] metoprolol tartrate (LOPRESSOR) 25 MG tablet Take 25 mg by mouth 2 (two) times daily.  . [DISCONTINUED] traMADol (ULTRAM) 50 MG tablet Take 1 tablet (50 mg total) by mouth every 8 (eight) hours as needed.   No facility-administered encounter medications on file as of 01/21/2015.      Review of Systems  Constitutional: Negative.   Respiratory: Negative.   Cardiovascular: Negative.   Genitourinary: Negative.  Musculoskeletal: Negative.   Neurological: Negative.   Psychiatric/Behavioral: Negative.        Objective:   Physical Exam  Constitutional: He is oriented to person, place, and time. He appears well-developed and well-nourished.  Cardiovascular: Normal rate and regular rhythm.   Pulmonary/Chest: Effort normal and breath sounds normal.  Neurological: He is alert and oriented to person, place, and time.  Psychiatric: He has a normal mood and affect. His behavior is normal.    BP 154/69 mmHg  Pulse 60  Temp(Src) 96.5 F (35.8 C) (Oral)  Ht 5\' 6"  (1.676 m)  Wt 231 lb (104.781 kg)  BMI 37.30 kg/m2       Assessment & Plan:   1. Type 2 diabetes, controlled, with neuropathy He also has chronic kidney disease probably related to diabetes. I would like him to see clinical pharmacologist with: Mind to manage his sugars with insulin rather than multiple oral medicines - POCT glycosylated hemoglobin (Hb A1C)  2. Hypertensive heart disease without CHF Followed by cardiology. He takes Lasix and potassium supplement and metoprolol and quinapril  3. Hyperlipidemia Since discontinuing Lipitor her seems to be no difference in myalgias which may be really more arthritic and neuropathic. If lipids have significantly increased consider restarting atorvastatin - Lipid panel  [name] MD

## 2015-01-21 NOTE — Patient Instructions (Signed)
Correction Insulin  Your health care provider has decided you need to take insulin regularly. You have been given a correction scale (also called a sliding scale) in case you need extra insulin when your blood sugar is too high (hyperglycemia). The following instructions will assist you in how to use that correction scale.   WHAT IS A CORRECTION SCALE?       When you check your blood sugar, sometimes it will be higher than your health care provider has told you it should be. You may need an extra dose of insulin to bring your blood sugar to the recommended level (also known as your goal, target, or normal level). The correction scale is prescribed by your health care provider based on your specific needs.   Your correction scale has two parts:   · The first shows you a blood sugar range.    · The second part tells you how much extra insulin to give yourself if your blood sugar falls within this range.  You will not need an extra dose of insulin if your blood glucose is in the desired range. You should simply give yourself the normal amount of insulin that your health care provider has ordered for you.   WHY IS IT IMPORTANT TO KEEP YOUR BLOOD SUGAR LEVELS AT YOUR DESIRED LEVEL?      Keeping your blood sugar at the desired level helps to prevent long-term complications of diabetes, such as eye disease, kidney failure, nerve damage, and other serious complications.  WHAT TYPE OF INSULIN WILL YOU USE?    To help bring down blood sugar levels that are too high, your health care provider will prescribe a short-acting or a rapid-acting insulin. An example of a short-acting insulin would be regular insulin. Remember, you may also have a longer-acting insulin prescribed for you.   WHAT DO YOU NEED TO DO?    · Check your blood sugar with your home blood glucose meter as recommended by your health care provider.    · Using your correction scale, find the range that your blood sugar lies in.    · Look for the units of insulin  that match that blood sugar range. Give yourself the dose of correction insulin your health care provider has prescribed. Always make sure you are using the right type of insulin.    · Prior to the injection, make sure you have food available that you can eat in the next 15-30 minutes.    ¨ If your correction insulin is rapid acting, start eating your meal within 15 minutes after you have given yourself the insulin injection. If you wait longer than 15 minutes to eat, your blood sugar might get too low.    ¨ If your correction insulin is short acting (regular), start eating your meal within 30 minutes after you have given yourself the insulin injection. If you wait longer than 30 minutes to eat, your blood sugar might get too low. Symptoms of low blood sugar (hypoglycemia) may include feeling shaky or weak, sweating, feeling confused, difficulty seeing, agitation, crankiness, or numbness of the lips or tongue. Check your blood sugar immediately and treat your results as directed by your health care provider.    · Keep a log of your blood sugar results with the time you took the test and the amount of insulin that you injected. This information will help your health care provider manage your medicines.    · Note on your log anything that may affect your blood sugar level, such as:    ¨ Changes in normal exercise or activity.    ¨   Changes in your normal schedule, such as staying up late, going on vacation, changing your diet, or holidays.    ¨ New medicines. This includes prescription and over-the-counter medicines. Some medicines may cause high blood sugar.    ¨ Sickness, stress, or anxiety.    ¨ Changes in the time you took your medicine.    ¨ Changes in your meals, such as skipping a meal, having a late meal, or dining out.    ¨ Eating things that may affect blood glucose, such as snacks, meal portions that are larger than normal, drinks with sugar, or eating less than usual.    · Ask your health care provider any  questions you have.  · Be aware of "stacking" your insulin doses. This happens when you correct a high blood sugar level by giving yourself extra insulin too soon after a previous correction dose or mealtime dose. You may then have too much insulin still active in your body and may be at risk for hypoglycemia.  WHY DO YOU NEED A CORRECTION SCALE IF YOU HAVE NEVER BEEN DIAGNOSED WITH DIABETES?    · Keeping your blood glucose in the target range is important for your overall health.    · You may have been prescribed medicines that cause your blood glucose to be higher than normal.  WHEN SHOULD YOU SEEK MEDICAL CARE?  Contact your health care provider if:   · You have experienced hypoglycemia that you are unable to treat with your usual routine.    · You have a high blood sugar level that is not coming down with the correction dose.  · Your blood sugar is often too low or does not come up even if you eat a fast-acting carbohydrate.   Someone who lives with you should seek immediate medical care if you become unresponsive.  Document Released: 01/08/2011 Document Revised: 04/19/2013 Document Reviewed: 01/27/2013  Exit[employer]® Patient Information ©2015 [employer]. This information is not intended to replace advice given to you by your health care provider. Make sure you discuss any questions you have with your health care provider.

## 2015-01-22 LAB — LIPID PANEL
Chol/HDL Ratio: 5.6 ratio units — ABNORMAL HIGH (ref 0.0–5.0)
Cholesterol, Total: 225 mg/dL — ABNORMAL HIGH (ref 100–199)
HDL: 40 mg/dL (ref >39)
LDL Calculated: 121 mg/dL — ABNORMAL HIGH (ref 0–99)
TRIGLYCERIDES: 319 mg/dL — AB (ref 0–149)
VLDL CHOLESTEROL CAL: 64 mg/dL — AB (ref 5–40)

## 2015-01-31 ENCOUNTER — Encounter: Payer: Self-pay | Admitting: Pharmacist

## 2015-01-31 ENCOUNTER — Ambulatory Visit (INDEPENDENT_AMBULATORY_CARE_PROVIDER_SITE_OTHER): Payer: Medicare Other | Admitting: Pharmacist

## 2015-01-31 ENCOUNTER — Telehealth: Payer: Self-pay | Admitting: Pharmacist

## 2015-01-31 VITALS — BP 144/70 | HR 77 | Ht 66.0 in | Wt 230.0 lb

## 2015-01-31 DIAGNOSIS — E1142 Type 2 diabetes mellitus with diabetic polyneuropathy: Secondary | ICD-10-CM

## 2015-01-31 DIAGNOSIS — N183 Chronic kidney disease, stage 3 unspecified: Secondary | ICD-10-CM

## 2015-01-31 DIAGNOSIS — G629 Polyneuropathy, unspecified: Secondary | ICD-10-CM

## 2015-01-31 DIAGNOSIS — E1140 Type 2 diabetes mellitus with diabetic neuropathy, unspecified: Secondary | ICD-10-CM

## 2015-01-31 DIAGNOSIS — E1342 Other specified diabetes mellitus with diabetic polyneuropathy: Secondary | ICD-10-CM | POA: Diagnosis not present

## 2015-01-31 HISTORY — DX: Type 2 diabetes mellitus with diabetic polyneuropathy: E11.42

## 2015-01-31 MED ORDER — INSULIN DEGLUDEC 200 UNIT/ML ~~LOC~~ SOPN: 100.0000 [IU] | PEN_INJECTOR | Freq: Every day | SUBCUTANEOUS | Status: DC

## 2015-01-31 NOTE — Progress Notes (Signed)
Subjective:      Manuel Kline is a 81 y.o. male who presents for an initial visit for evaluation of Type 2 diabetes mellitus.  The initial diagnosis of diabetes was made over a year ago.    His clinical course has fluctuated. Insulin dosage review with Abijah suggested compliance most of the time. Associated symptoms of hyperglycemia have been fatigue.  Associated symptoms of hypoglycemia have been none.   He is currently taking Novolog (will switch to Humalog when uses up Humalog her had on hand) 40 units prior to each meal units (at his last visit he was started 24 units prior to each meal but his BG increased and he increase to current dose of 40 units on own) and Lantus 80 units (dose recently decrease because patient was injecting Lantus tid prior to last appt with Dr Sabra Heck) units   Patient has reached Medicare coverage gap and is not able to afford insulin.  He recently picked up 1 vial of Lantus (= 49ml) and 1 vial of Humalog and has to pay over $200.  Patient is [facility] resident so does not qualify for assistance with [facility] patient Assistance program.  Compliance with blood glucose monitoring: good.  The patient does perform independently. Rotation of sites for injection: abdominal wall Exercise: intermittently  Meal panning: He is using no plan. Blood glucose reading from home:  276, 318, 338, 197, 147, 213, 288, 198, 121, 287, 154          The following portions of the patient's history were reviewed and updated as appropriate: allergies, current medications, past family history, past medical history, past social history, past surgical history and problem list.   Objective:    BP 144/70 mmHg  Pulse 77  Ht 5\' 6"  (1.676 m)  Wt 230 lb (104.327 kg)  BMI 37.14 kg/m2  A1c = 8.0% (01/21/2015)    Assessment:    Diabetes Mellitus type I, under inadequate control.   Diabetic neuropathy Stage 3 chronic kidney disease Medication Problem - unable to afford insulin  because in Medicare Coverage Gap Obesity  Plan:    1.  RX changes:   Patient was given Tresiba 200u/ml in office today #2 samples.  We are looking into if patient would qualify for patient assistance from [place].  Patient instructed to start with 100 units daily and increase by 1 unit daily until FBG is 150 or less.   Change Novolog / Humalog to 30 units prior to each meal  Patient given patient assistance paperwork to fill out and return to office. 2.  Education:  interpretation of lab results, blood sugar goals, complications of diabetes mellitus, hypoglycemia prevention and treatment, exercise, self-monitoring of blood glucose skills, carbohydrate counting, use of insulin pen, insulin adjustments and use of sliding scale/correction formula 3.  Compliance at present is estimated to be good. Efforts to improve compliance (if necessary) will be directed at dietary modifications, increased exercise and regular blood sugar monitoring. 4. Follow up: 1 month.  Total time spent with patient = 60 minutes  Cherre Robins, PharmD, CPP

## 2015-01-31 NOTE — Telephone Encounter (Signed)
He didn't have calluses or anything to qualify for diabetic shoes, right?

## 2015-01-31 NOTE — Patient Instructions (Signed)
Manuel Kline - long acting insulin - replaces Lantus.  Inject 100 units once a day at bedtime.  Check blood glucose each morning - if blood glucose if over 150 then increase evening dose by 1 unit.  Once morning blood glucose is 150 or less then hold at that dose.  Novolog / Humalog - short acting insulin.  Inject 30 units before each meal.    Hypoglycemia Hypoglycemia occurs when the glucose in your blood is too low. Glucose is a type of sugar that is your body's main energy source. Hormones, such as insulin and glucagon, control the level of glucose in the blood. Insulin lowers blood glucose and glucagon increases blood glucose. Having too much insulin in your blood stream, or not eating enough food containing sugar, can result in hypoglycemia. Hypoglycemia can happen to people with or without diabetes. It can develop quickly and can be a medical emergency.  CAUSES   Missing or delaying meals.  Not eating enough carbohydrates at meals.  Taking too much diabetes medicine.  Not timing your oral diabetes medicine or insulin doses with meals, snacks, and exercise.  Nausea and vomiting.  Certain medicines.  Severe illnesses, such as hepatitis, kidney disorders, and certain eating disorders.  Increased activity or exercise without eating something extra or adjusting medicines.  Drinking too much alcohol.  A nerve disorder that affects body functions like your heart rate, blood pressure, and digestion (autonomic neuropathy).  A condition where the stomach muscles do not function properly (gastroparesis). Therefore, medicines and food may not absorb properly.  Rarely, a tumor of the pancreas can produce too much insulin. SYMPTOMS   Hunger.  Sweating (diaphoresis).  Change in body temperature.  Shakiness.  Headache.  Anxiety.  Lightheadedness.  Irritability.  Difficulty concentrating.  Dry mouth.  Tingling or numbness in the hands or feet.  Restless sleep or sleep  disturbances.  Altered speech and coordination.  Change in mental status.  Seizures or prolonged convulsions.  Combativeness.  Drowsiness (lethargic).  Weakness.  Increased heart rate or palpitations.  Confusion.  Pale, gray skin color.  Blurred or double vision.  Fainting. DIAGNOSIS  A physical exam and medical history will be performed. Your caregiver may make a diagnosis based on your symptoms. Blood tests and other lab tests may be performed to confirm a diagnosis. Once the diagnosis is made, your caregiver will see if your signs and symptoms go away once your blood glucose is raised.  TREATMENT  Usually, you can easily treat your hypoglycemia when you notice symptoms.  Check your blood glucose. If it is less than 70 mg/dl, take one of the following:   3-4 glucose tablets.    cup juice.    cup regular soda.   1 cup skim milk.   -1 tube of glucose gel.   5-6 hard candies.   Avoid high-fat drinks or food that may delay a rise in blood glucose levels.  Do not take more than the recommended amount of sugary foods, drinks, gel, or tablets. Doing so will cause your blood glucose to go too high.   Wait 10-15 minutes and recheck your blood glucose. If it is still less than 70 mg/dl or below your target range, repeat treatment.   Eat a snack if it is more than 1 hour until your next meal.  There may be a time when your blood glucose may go so low that you are unable to treat yourself at home when you start to notice symptoms. You may need  someone to help you. You may even faint or be unable to swallow. If you cannot treat yourself, someone will need to bring you to the hospital.  [name]  If you have diabetes, follow your diabetes management plan by:  Taking your medicines as directed.  Following your exercise plan.  Following your meal plan. Do not skip meals. Eat on time.  Testing your blood glucose regularly. Check your blood  glucose before and after exercise. If you exercise longer or different than usual, be sure to check blood glucose more frequently.  Wearing your medical alert jewelry that says you have diabetes.  Identify the cause of your hypoglycemia. Then, develop ways to prevent the recurrence of hypoglycemia.  Do not take a hot bath or shower right after an insulin shot.  Always carry treatment with you. Glucose tablets are the easiest to carry.  If you are going to drink alcohol, drink it only with meals.  Tell friends or family members ways to keep you safe during a seizure. This may include removing hard or sharp objects from the area or turning you on your side.  Maintain a healthy weight. SEEK MEDICAL CARE IF:   You are having problems keeping your blood glucose in your target range.  You are having frequent episodes of hypoglycemia.  You feel you might be having side effects from your medicines.  You are not sure why your blood glucose is dropping so low.  You notice a change in vision or a new problem with your vision. SEEK IMMEDIATE MEDICAL CARE IF:   Confusion develops.  A change in mental status occurs.  The inability to swallow develops.  Fainting occurs. Document Released: 08/17/2005 Document Revised: 08/22/2013 Document Reviewed: 12/14/2011 [facility] Patient Information 2015 [place], [place]. This information is not intended to replace advice given to you by your health care provider. Make sure you discuss any questions you have with your health care provider.

## 2015-01-31 NOTE — Telephone Encounter (Signed)
I'm not sure he would qualify for diabetic shoes without more significant changes

## 2015-02-01 NOTE — Telephone Encounter (Signed)
Patient does complain of a sore area on the bottom of his right foot below his great toe.  His wife has been applying some salve and he states that there may be something in it.  Patient said that Dr Sabra Heck didn't comment on it.  Asked him to keep an eye on it and if it persists or worsens to call me back next week.

## 2015-02-01 NOTE — Telephone Encounter (Signed)
Manuel Kline, there is NO documentation on the FOOT CHECK. Can this be edited if the foot was done.   The paperwork was or is supposed to be sent to [facility] (even though he just Podiatry)

## 2015-02-11 DIAGNOSIS — N184 Chronic kidney disease, stage 4 (severe): Secondary | ICD-10-CM | POA: Diagnosis not present

## 2015-02-11 DIAGNOSIS — E119 Type 2 diabetes mellitus without complications: Secondary | ICD-10-CM | POA: Diagnosis not present

## 2015-02-21 DIAGNOSIS — I255 Ischemic cardiomyopathy: Secondary | ICD-10-CM | POA: Diagnosis not present

## 2015-02-21 DIAGNOSIS — E785 Hyperlipidemia, unspecified: Secondary | ICD-10-CM | POA: Diagnosis not present

## 2015-02-21 DIAGNOSIS — I2510 Atherosclerotic heart disease of native coronary artery without angina pectoris: Secondary | ICD-10-CM | POA: Diagnosis not present

## 2015-02-21 DIAGNOSIS — I119 Hypertensive heart disease without heart failure: Secondary | ICD-10-CM | POA: Diagnosis not present

## 2015-03-01 ENCOUNTER — Ambulatory Visit (INDEPENDENT_AMBULATORY_CARE_PROVIDER_SITE_OTHER): Payer: Medicare Other | Admitting: Pharmacist

## 2015-03-01 ENCOUNTER — Encounter: Payer: Self-pay | Admitting: Pharmacist

## 2015-03-01 VITALS — BP 136/76 | HR 68 | Ht 66.0 in | Wt 230.5 lb

## 2015-03-01 DIAGNOSIS — E1121 Type 2 diabetes mellitus with diabetic nephropathy: Secondary | ICD-10-CM

## 2015-03-01 DIAGNOSIS — Z0000 Encounter for general adult medical examination without abnormal findings: Secondary | ICD-10-CM | POA: Diagnosis not present

## 2015-03-01 DIAGNOSIS — IMO0002 Reserved for concepts with insufficient information to code with codable children: Secondary | ICD-10-CM

## 2015-03-01 DIAGNOSIS — E1165 Type 2 diabetes mellitus with hyperglycemia: Secondary | ICD-10-CM | POA: Diagnosis not present

## 2015-03-01 DIAGNOSIS — Z23 Encounter for immunization: Secondary | ICD-10-CM

## 2015-03-01 DIAGNOSIS — Z1211 Encounter for screening for malignant neoplasm of colon: Secondary | ICD-10-CM

## 2015-03-01 DIAGNOSIS — E1129 Type 2 diabetes mellitus with other diabetic kidney complication: Secondary | ICD-10-CM | POA: Diagnosis not present

## 2015-03-01 MED ORDER — ALLOPURINOL 300 MG PO TABS: 150.0000 mg | ORAL_TABLET | Freq: Every day | ORAL | Status: DC

## 2015-03-01 NOTE — Patient Instructions (Addendum)
  Mr. Harbold , Thank you for taking time to come for your Medicare Wellness Visit. I appreciate your ongoing commitment to your health goals. Please review the following plan we discussed and let me know if I can assist you in the future.   These are the goals we discussed: Goals    . Increase physical activity     Review that handout I gave you for chair exercise.  Try to do these at least once daily - 5 days per week.     . Reduce calorie intake to 2000 calories per day      Increase non-starchy vegetables - carrots, green bean, squash, zucchini, tomatoes, onions, peppers, spinach and other green leafy vegetables, cabbage, lettuce, cucumbers, asparagus, okra (not fried), eggplant limit sugar and processed foods (cakes, cookies, ice cream, crackers and chips) Increase fresh fruit but limit serving sizes 1/2 cup or about the size of tennis or baseball limit red meat to no more than 1-2 times per week (serving size about the size of your palm) Choose whole grains / lean proteins - whole wheat bread, quinoa, whole grain rice (1/2 cup), fish, chicken, [place]        This is a list of the screening recommended for you and due dates:  Health Maintenance  Topic Date Due  . Tetanus Vaccine  Possibly due - we are checking with Dr Altheimer's records - cost is $27.69  . Shingles Vaccine  Possibly due - we are checking with Dr Altheimer's records - cost is $104.06    . Pneumonia vaccines (1 of 2 - PCV13) Received Prevnar 13 today in office  . Flu Shot  04/01/2015  . Hemoglobin A1C  04/23/2015  . Complete foot exam   09/26/2015  . Urine Protein Check  09/26/2015  . Eye exam for diabetics  10/16/2015  . Colon Cancer Screening  06/20/2022

## 2015-03-01 NOTE — Progress Notes (Signed)
Patient ID: Manuel Kline, male   DOB: 1932-04-28, 81 y.o.   MRN: [id number]    Subjective:   Manuel Kline is a 81 y.o. male who presents for an Initial Medicare Annual Wellness Visit and to reassess diabetes     His clinical course has fluctuated. Per his home BG reading range is 64 to 247.  14 day average = 163.  64, 182, 179, 85, 214, 247, 133, 204, 68, 125, 222, 150, 180, 146, 207, 178, 180.  Last A1c was 8.0% 01/21/2015  He is currently taking Novolog 30units prior to each meal units and Lantus 80 units (will try Tresiba 100 units qd once finished his supply of Lantus) Patient is in the medicare coverage gap and lives in [facility] so he does not qualify to get asssistance through [facility].  Current Medications (verified) Outpatient Encounter Prescriptions as of 03/01/2015  Medication Sig  . allopurinol (ZYLOPRIM) 300 MG tablet 1 tablet.  Marland Kitchen aspirin 81 MG tablet Take 81 mg by mouth daily.   Marland Kitchen atorvastatin (LIPITOR) 80 MG tablet Take 40 mg by mouth daily.  . cloNIDine (CATAPRES) 0.1 MG tablet Take 0.1 mg by mouth 2 (two) times daily.  . furosemide (LASIX) 40 MG tablet Take 1 tablet by mouth 2 (two) times daily.  Marland Kitchen gabapentin (NEURONTIN) 300 MG capsule Take 1 capsule (300 mg total) by mouth 2 (two) times daily.  Marland Kitchen glucose blood (ACCU-CHEK AVIVA) test strip 1 each by Other route 4 (four) times daily. DX: E11.40  . insulin glargine (LANTUS) 100 UNIT/ML injection Inject 0.8 mLs (80 Units total) into the skin at bedtime.  . insulin lispro (HUMALOG) 100 UNIT/ML injection Inject 0.3 mLs (30 Units total) into the skin 3 (three) times daily before meals.  . metoprolol succinate (TOPROL-XL) 50 MG 24 hr tablet Take 50 mg by mouth daily.  . Multiple Vitamin (MULTIVITAMIN) tablet Take 1 tablet by mouth daily. Eye formula  . Omega-3 Fatty Acids (FISH OIL) 1000 MG CPDR Take 1 tablet by mouth daily.  Marland Kitchen omeprazole (PRILOSEC) 20 MG capsule Take 1 capsule (20 mg total)  by mouth 2 (two) times daily before a meal. TAKE ONE CAPSULE BY MOUTH TWICE DAILy  . potassium chloride SA (KLOR-CON M20) 20 MEQ tablet Take 1 tablet (20 mEq total) by mouth once.  . quinapril (ACCUPRIL) 40 MG tablet Take 40 mg by mouth at bedtime.  . Insulin Degludec (TRESIBA FLEXTOUCH) 200 UNIT/ML SOPN Inject 100 Units into the skin daily. (Patient not taking: Reported on 03/01/2015)  . [DISCONTINUED] allopurinol (ZYLOPRIM) 100 MG tablet Take 100 mg by mouth 2 (two) times daily.  . [DISCONTINUED] furosemide (LASIX) 40 MG tablet Take 40 mg by mouth daily.  . [DISCONTINUED] metoprolol (LOPRESSOR) 50 MG tablet Take 50 mg by mouth 2 (two) times daily.   No facility-administered encounter medications on file as of 03/01/2015.    Allergies (verified) Review of patient's allergies indicates no known allergies.   History: Past Medical History  Diagnosis Date  . CAD (coronary artery disease)   . S/P CABG (coronary artery bypass graft) 11/02/2000  . Hypertensive heart disease without CHF 07/31/2011  . ICD dual chamber in situ   . Sleep apnea   . Morbid obesity   . Erosive esophagitis   . Cardiomyopathy   . Erectile dysfunction   . Metabolic syndrome   . Osteoarthritis   . Calcium oxalate renal stones   . Adenomatous colon polyp 2006  . Hemorrhoids   .  Diabetes   . HTN (hypertension)   . HLD (hyperlipidemia)   . ICD (implantable cardiac defibrillator) in place    Past Surgical History  Procedure Laterality Date  . Doppler echocardiography  2003  . Back surgery      X'3  . Carpal tunnel release      X2, bilateral  . Abdominal exploration surgery    . Laparotomy    . Cardiac bypass    . Cardiac defibrillator placement    . Esophagogastroduodenoscopy  06/20/2012    Procedure: ESOPHAGOGASTRODUODENOSCOPY (EGD);  Surgeon: Sable Feil, MD;  Location: [facility];  Service: Endoscopy;  Laterality: N/A;  . Colonoscopy  06/20/2012    Procedure: COLONOSCOPY;  Surgeon: Sable Feil, MD;  Location: [facility];  Service: Endoscopy;  Laterality: N/A;  . Rotator cuff surgery      left   Family History  Problem Relation Age of Onset  . Diabetes Brother   . Heart disease Father   . Heart disease Mother   . Throat cancer Paternal Uncle   . Diabetes Brother   . Diabetes Brother    Social History   Occupational History  . Retired from [employer]  . Smoking status: Never Smoker   . Smokeless tobacco: Never Used  . Alcohol Use: No  . Drug Use: No  . Sexual Activity: Yes    Do you feel safe at home?  Yes  Dietary issues and exercise activities discussed: Current Exercise Habits:: The patient does not participate in regular exercise at present (patient gardens but cannot do much exercise due to neuropathy)  Current Dietary habits:  Trying to limit CHO but there are still times of indiscretion.  Cardiac Risk Factors include: advanced age (>85men, >9 women);diabetes mellitus;dyslipidemia;family history of premature cardiovascular disease;hypertension;male gender;obesity (BMI >30kg/m2)  Objective:    Today's Vitals   03/01/15 1021  BP: 136/76  Pulse: 68  Height: 5\' 6"  (1.676 m)  Weight: 230 lb 8 oz (104.554 kg)  PainSc: 0-No pain   Body mass index is 37.22 kg/(m^2).   Activities of Daily Living In your present state of health, do you have any difficulty performing the following activities: 03/01/2015 03/01/2015  Hearing? [name]  Vision? N N  Difficulty concentrating or making decisions? - N  Walking or climbing stairs? - N  Dressing or bathing? - N  Doing errands, shopping? - N  Conservation officer, nature and eating ? - N  Using the Toilet? - N  In the past six months, have you accidently leaked urine? - N  Do you have problems with loss of bowel control? - N  Managing your Medications? - N  Managing your Finances? - N  Housekeeping or managing your Housekeeping? - N    Are there smokers in your home (other than  you)? No    Depression Screen PHQ 2/9 Scores 03/01/2015 10/16/2014  PHQ - 2 Score 0 0    Fall Risk Fall Risk  03/01/2015 10/16/2014  Falls in the past year? Yes Yes  Number falls in past yr: 1 1  Injury with Fall? Yes Yes  Risk Factor Category  High Fall Risk -  Risk for fall due to : Other (Comment) -  Risk for fall due to (comments): PATIENT HAS HYPOGLYCEMIC EVENT -  Follow up Falls prevention discussed;Education provided -    Cognitive Function: MMSE - Mini Mental State Exam 03/01/2015  Orientation to time 5  Orientation to Place  5  Registration 3  Attention/ Calculation 3  Recall 3  Language- name 2 objects 2  Language- repeat 1  Language- follow 3 step command 3  Language- read & follow direction 1  Write a sentence 1  Copy design 1  Total score 28    Immunizations and Health Maintenance Immunization History  Administered Date(s) Administered  . Influenza,inj,Quad PF,36+ Mos 06/12/2014   Health Maintenance Due  Topic Date Due  . TETANUS/TDAP  01/17/1953  . ZOSTAVAX  01/17/1994  . PNA vac Low Risk Adult (1 of 2 - PCV13) 01/18/1999    Patient Care Team: [name], MD as PCP - General (Family Medicine) Regenia Skeeter, MD as Consulting Physician (Internal Medicine) Wallene Huh, DPM as Consulting Physician (Podiatry) Jacolyn Reedy, MD as Consulting Physician (Cardiology) Hurman Horn, MD as Consulting Physician (Ophthalmology) Evans Lance, MD as Consulting Physician (Cardiology)  Indicate any recent Medical Services you may have received from other than [facility] providers in the past year (date may be approximate).    Assessment:    Annual Wellness Visit  Type 2 DM requiring insulin - uncontrolled Medication management  Screening Tests Health Maintenance  Topic Date Due  . TETANUS/TDAP  01/17/1953  . ZOSTAVAX  01/17/1994  . PNA vac Low Risk Adult (1 of 2 - PCV13) 01/18/1999  . INFLUENZA VACCINE  04/01/2015  . HEMOGLOBIN A1C  07/24/2015    . FOOT EXAM  09/26/2015  . URINE MICROALBUMIN  09/26/2015  . OPHTHALMOLOGY EXAM  10/16/2015  . COLONOSCOPY  06/20/2022        Plan:   During the course of the visit Manuel Kline was educated and counseled about the following appropriate screening and preventive services:   Vaccines to include Pneumoccal, Influenza, Hepatitis B, Td, Zostavax - checking into Zostavax and Tdap - patient thinks he received at D Altheimer's office.  Prevnar 13 given in office today.  Tried to clarify several medication discrepancies - patient is taking metoprolol succinate 50mg  daily (not tartrate).  Also he has allopurinol 300mg  tablets but 100mg  bid was on his list - I instructed patient to take 1/2 tablet of 300mg  once daily due to recent estimated CrCl of 25 m/min.  Atorvastatin added back as patient was instructed to restart per Dr Wynonia Lawman - cardiologist.  Colorectal cancer screening - colonoscopy UTD; FOBT given in office today to return.  Cardiovascular disease screening - sees Dr Wynonia Lawman - last LDL was elevated but atorvastatin restarted.  BP was at goal today  Patient given sample of Novolog pen and will continue to explore ways to assist with medications.  Glaucoma screening / Diabetic Eye Exam - UTD   Nutrition counseling - reveiewed CHO counting with patient and wife.  Orders Placed This Encounter  Procedures  . Fecal occult blood, imunochemical    Standing Status: Future     Number of Occurrences:      Standing Expiration Date: 05/02/2015  . Pneumococcal conjugate vaccine 13-valent IM  . Microalbumin / creatinine urine ratio      Goals    None       Patient Instructions (the written plan) were given to the patient.   Cherre Robins, [facility]   03/01/2015

## 2015-03-02 LAB — MICROALBUMIN / CREATININE URINE RATIO
CREATININE, UR: 35 mg/dL
MICROALB/CREAT RATIO: 9.4 mg/g{creat} (ref 0.0–30.0)
MICROALBUM., U, RANDOM: 3.3 ug/mL

## 2015-03-08 ENCOUNTER — Other Ambulatory Visit: Payer: Medicare Other

## 2015-03-08 DIAGNOSIS — Z1212 Encounter for screening for malignant neoplasm of rectum: Secondary | ICD-10-CM | POA: Diagnosis not present

## 2015-03-08 NOTE — Progress Notes (Signed)
Lab only 

## 2015-03-10 LAB — FECAL OCCULT BLOOD, IMMUNOCHEMICAL: FECAL OCCULT BLD: NEGATIVE

## 2015-03-11 DIAGNOSIS — Z95810 Presence of automatic (implantable) cardiac defibrillator: Secondary | ICD-10-CM | POA: Diagnosis not present

## 2015-03-12 DIAGNOSIS — E11319 Type 2 diabetes mellitus with unspecified diabetic retinopathy without macular edema: Secondary | ICD-10-CM | POA: Diagnosis not present

## 2015-03-12 DIAGNOSIS — H35033 Hypertensive retinopathy, bilateral: Secondary | ICD-10-CM | POA: Diagnosis not present

## 2015-03-12 DIAGNOSIS — H40013 Open angle with borderline findings, low risk, bilateral: Secondary | ICD-10-CM | POA: Diagnosis not present

## 2015-03-12 DIAGNOSIS — H26492 Other secondary cataract, left eye: Secondary | ICD-10-CM | POA: Diagnosis not present

## 2015-03-19 ENCOUNTER — Encounter: Payer: Self-pay | Admitting: Physician Assistant

## 2015-03-19 ENCOUNTER — Ambulatory Visit (INDEPENDENT_AMBULATORY_CARE_PROVIDER_SITE_OTHER): Payer: Medicare Other | Admitting: Physician Assistant

## 2015-03-19 VITALS — BP 119/68 | HR 60 | Temp 97.0°F | Ht 66.0 in | Wt 230.0 lb

## 2015-03-19 DIAGNOSIS — D6489 Other specified anemias: Secondary | ICD-10-CM

## 2015-03-19 DIAGNOSIS — I952 Hypotension due to drugs: Secondary | ICD-10-CM | POA: Diagnosis not present

## 2015-03-19 LAB — POCT CBC
Granulocyte percent: 64.1 %G (ref 37–80)
HCT, POC: 39.3 % — AB (ref 43.5–53.7)
HEMOGLOBIN: 12.7 g/dL — AB (ref 14.1–18.1)
Lymph, poc: 2.2 (ref 0.6–3.4)
MCH, POC: 29 pg (ref 27–31.2)
MCHC: 32.3 g/dL (ref 31.8–35.4)
MCV: 89.7 fL (ref 80–97)
MPV: 7.7 fL (ref 0–99.8)
PLATELET COUNT, POC: 203 10*3/uL (ref 142–424)
POC Granulocyte: 4.7 (ref 2–6.9)
POC LYMPH PERCENT: 30.1 %L (ref 10–50)
RBC: 4.38 M/uL — AB (ref 4.69–6.13)
RDW, POC: 16.8 %
WBC: 7.4 10*3/uL (ref 4.6–10.2)

## 2015-03-19 NOTE — Patient Instructions (Signed)
Anemia, Nonspecific Anemia is a condition in which the concentration of red blood cells or hemoglobin in the blood is below normal. Hemoglobin is a substance in red blood cells that carries oxygen to the tissues of the body. Anemia results in not enough oxygen reaching these tissues.  CAUSES  Common causes of anemia include:   Excessive bleeding. Bleeding may be internal or external. This includes excessive bleeding from periods (in women) or from the intestine.   Poor nutrition.   Chronic kidney, thyroid, and liver disease.  Bone marrow disorders that decrease red blood cell production.  Cancer and treatments for cancer.  HIV, AIDS, and their treatments.  Spleen problems that increase red blood cell destruction.  Blood disorders.  Excess destruction of red blood cells due to infection, medicines, and autoimmune disorders. SIGNS AND SYMPTOMS   Minor weakness.   Dizziness.   Headache.  Palpitations.   Shortness of breath, especially with exercise.   Paleness.  Cold sensitivity.  Indigestion.  Nausea.  Difficulty sleeping.  Difficulty concentrating. Symptoms may occur suddenly or they may develop slowly.  DIAGNOSIS  Additional blood tests are often needed. These help your health care provider determine the best treatment. Your health care provider will check your stool for blood and look for other causes of blood loss.  TREATMENT  Treatment varies depending on the cause of the anemia. Treatment can include:   Supplements of iron, vitamin B12, or folic acid.   Hormone medicines.   A blood transfusion. This may be needed if blood loss is severe.   Hospitalization. This may be needed if there is significant continual blood loss.   Dietary changes.  Spleen removal. HOME CARE INSTRUCTIONS Keep all follow-up appointments. It often takes many weeks to correct anemia, and having your health care provider check on your condition and your response to  treatment is very important. SEEK IMMEDIATE MEDICAL CARE IF:   You develop extreme weakness, shortness of breath, or chest pain.   You become dizzy or have trouble concentrating.  You develop heavy vaginal bleeding.   You develop a rash.   You have bloody or black, tarry stools.   You faint.   You vomit up blood.   You vomit repeatedly.   You have abdominal pain.  You have a fever or persistent symptoms for more than 2-3 days.   You have a fever and your symptoms suddenly get worse.   You are dehydrated.  MAKE SURE YOU:  Understand these instructions.  Will watch your condition.  Will get help right away if you are not doing well or get worse. Document Released: 09/24/2004 Document Revised: 04/19/2013 Document Reviewed: 02/10/2013 ExitCare Patient Information 2015 [employer]. This information is not intended to replace advice given to you by your health care provider. Make sure you discuss any questions you have with your health care provider.  

## 2015-03-19 NOTE — Progress Notes (Signed)
Subjective:     Patient ID: Manuel Kline, male   DOB: [birth year], 81 y.o.   MRN: [id number]  HPI Pt with onset of feeling like he was going to pass out this am No LOC Pt took BP and it was 90's/60 and BS 170 He had just taken his Clonidine Feels better at this time Hx of near syncope prev with negative work up  Review of Systems  Constitutional: Negative.   HENT: Negative.   Respiratory: Negative.   Cardiovascular: Negative.        Objective:   Physical Exam  Constitutional: He appears well-developed and well-nourished.  HENT:  Mouth/Throat: Oropharynx is clear and moist. No oropharyngeal exudate.  Neck: Neck supple. No JVD present.  Cardiovascular: Normal rate, regular rhythm, normal heart sounds and intact distal pulses.   Pulmonary/Chest: Effort normal and breath sounds normal.  Lymphadenopathy:    He has no cervical adenopathy.  Nursing note and vitals reviewed. Hgb low on todays visit     Assessment:     Hypotension    Plan:     Hold Clonidine for now Anemia profile pending F/U in 1 week for f/u Hydrate well Will inform of lab results

## 2015-03-20 LAB — ANEMIA PROFILE B
BASOS ABS: 0 10*3/uL (ref 0.0–0.2)
Basos: 0 %
EOS (ABSOLUTE): 0.2 10*3/uL (ref 0.0–0.4)
Eos: 2 %
FERRITIN: 74 ng/mL (ref 30–400)
Folate: >20 ng/mL (ref >3.0)
HEMATOCRIT: 38 % (ref 37.5–51.0)
Hemoglobin: 12.8 g/dL (ref 12.6–17.7)
IRON SATURATION: 22 % (ref 15–55)
Immature Grans (Abs): 0 10*3/uL (ref 0.0–0.1)
Immature Granulocytes: 0 %
Iron: 68 ug/dL (ref 38–169)
LYMPHS ABS: 2 10*3/uL (ref 0.7–3.1)
LYMPHS: 28 %
MCH: 30.1 pg (ref 26.6–33.0)
MCHC: 33.7 g/dL (ref 31.5–35.7)
MCV: 89 fL (ref 79–97)
Monocytes Absolute: 0.6 10*3/uL (ref 0.1–0.9)
Monocytes: 8 %
Neutrophils Absolute: 4.3 10*3/uL (ref 1.4–7.0)
Neutrophils: 62 %
Platelets: 193 10*3/uL (ref 150–379)
RBC: 4.25 x10E6/uL (ref 4.14–5.80)
RDW: 16.5 % — ABNORMAL HIGH (ref 12.3–15.4)
Retic Ct Pct: 1.6 % (ref 0.6–2.6)
Total Iron Binding Capacity: 306 ug/dL (ref 250–450)
UIBC: 238 ug/dL (ref 111–343)
WBC: 7 10*3/uL (ref 3.4–10.8)

## 2015-03-22 DIAGNOSIS — D225 Melanocytic nevi of trunk: Secondary | ICD-10-CM | POA: Diagnosis not present

## 2015-03-22 DIAGNOSIS — L820 Inflamed seborrheic keratosis: Secondary | ICD-10-CM | POA: Diagnosis not present

## 2015-03-30 ENCOUNTER — Telehealth: Payer: Self-pay | Admitting: Family Medicine

## 2015-04-02 ENCOUNTER — Telehealth: Payer: Self-pay | Admitting: Family Medicine

## 2015-04-02 MED ORDER — INSULIN ASPART 100 UNIT/ML ~~LOC~~ SOLN: 30.0000 [IU] | Freq: Three times a day (TID) | SUBCUTANEOUS | Status: DC

## 2015-04-02 NOTE — Telephone Encounter (Signed)
Spoke with patient's wife.  #2 samples of Tresiba left for patient to pick up and rx for novolog sent to [facility].  We are working on paperwork to get patient assistance for medications.

## 2015-04-09 ENCOUNTER — Other Ambulatory Visit: Payer: Self-pay | Admitting: Family Medicine

## 2015-04-15 DIAGNOSIS — E11339 Type 2 diabetes mellitus with moderate nonproliferative diabetic retinopathy without macular edema: Secondary | ICD-10-CM | POA: Diagnosis not present

## 2015-04-15 LAB — HM DIABETES EYE EXAM

## 2015-04-18 ENCOUNTER — Telehealth: Payer: Self-pay | Admitting: Pharmacist

## 2015-04-18 NOTE — Telephone Encounter (Signed)
Patient has been approved for patient assistance program through [employer].  Samples have been shipped to [facility] and are available for patietn to pick up.

## 2015-04-24 ENCOUNTER — Encounter: Payer: Self-pay | Admitting: Family Medicine

## 2015-04-24 ENCOUNTER — Ambulatory Visit (INDEPENDENT_AMBULATORY_CARE_PROVIDER_SITE_OTHER): Payer: Medicare Other | Admitting: Family Medicine

## 2015-04-24 VITALS — BP 115/72 | HR 61 | Temp 96.8°F | Ht 66.0 in | Wt 229.0 lb

## 2015-04-24 DIAGNOSIS — N183 Chronic kidney disease, stage 3 unspecified: Secondary | ICD-10-CM

## 2015-04-24 DIAGNOSIS — E1140 Type 2 diabetes mellitus with diabetic neuropathy, unspecified: Secondary | ICD-10-CM | POA: Diagnosis not present

## 2015-04-24 DIAGNOSIS — I2510 Atherosclerotic heart disease of native coronary artery without angina pectoris: Secondary | ICD-10-CM | POA: Diagnosis not present

## 2015-04-24 DIAGNOSIS — E1165 Type 2 diabetes mellitus with hyperglycemia: Secondary | ICD-10-CM

## 2015-04-24 DIAGNOSIS — E785 Hyperlipidemia, unspecified: Secondary | ICD-10-CM

## 2015-04-24 DIAGNOSIS — IMO0002 Reserved for concepts with insufficient information to code with codable children: Secondary | ICD-10-CM

## 2015-04-24 LAB — POCT URINALYSIS DIPSTICK
Bilirubin, UA: NEGATIVE
Glucose, UA: NEGATIVE
Ketones, UA: NEGATIVE
Leukocytes, UA: NEGATIVE
NITRITE UA: NEGATIVE
Protein, UA: NEGATIVE
RBC UA: NEGATIVE
Spec Grav, UA: 1.01
UROBILINOGEN UA: NEGATIVE
pH, UA: 7

## 2015-04-24 LAB — POCT GLYCOSYLATED HEMOGLOBIN (HGB A1C): Hemoglobin A1C: 7.8

## 2015-04-24 MED ORDER — INSULIN DEGLUDEC 200 UNIT/ML ~~LOC~~ SOPN: 100.0000 [IU] | PEN_INJECTOR | Freq: Every day | SUBCUTANEOUS | Status: DC

## 2015-04-24 NOTE — Progress Notes (Signed)
Subjective:    Patient ID: Manuel Kline, male    DOB: 19-Mar-1932, 81 y.o.   MRN: [id number]  HPI 4-year-old gentleman with diabetes. We've had issues obtaining his medications currently he is on long-acting and short-acting insulin. There are some issues with diet as well but we stressed again the importance of that at each visit. His last A1c was 8.0 and that may be about as good as we can expect. I do think if he were not taking insulin it would be much worse. He denies any new symptoms in his legs. He denies chest pain or visual changes.  Patient Active Problem List   Diagnosis Date Noted  . Diabetic peripheral neuropathy 01/31/2015  . CKD (chronic kidney disease) stage 3, GFR 30-59 ml/min 11/05/2014  . OSA on CPAP 11/05/2014  . Severe obesity (BMI >= 40) 11/05/2014  . Sleep apnea in adult   . CAD (coronary artery disease)   . Hypertensive heart disease without CHF   . Dual implantable cardioverter-defibrillator in situ   . Sleep apnea   . Hyperlipidemia   . Type 2 diabetes, uncontrolled, with neuropathy   . Morbid obesity   . Ischemic cardiomyopathy   . Chronic systolic heart failure   . S/P CABG (coronary artery bypass graft) 11/02/2000   Outpatient Encounter Prescriptions as of 04/24/2015  Medication Sig  . allopurinol (ZYLOPRIM) 300 MG tablet Take 0.5 tablets (150 mg total) by mouth daily.  Marland Kitchen aspirin 81 MG tablet Take 81 mg by mouth daily.   Marland Kitchen atorvastatin (LIPITOR) 80 MG tablet Take 40 mg by mouth daily.  . cloNIDine (CATAPRES) 0.1 MG tablet Take 0.1 mg by mouth 2 (two) times daily.  . furosemide (LASIX) 40 MG tablet Take 1 tablet by mouth 2 (two) times daily.  Marland Kitchen gabapentin (NEURONTIN) 300 MG capsule TAKE ONE CAPSULE BY MOUTH TWICE DAILY  . glucose blood (ACCU-CHEK AVIVA) test strip 1 each by Other route 4 (four) times daily. DX: E11.40  . insulin aspart (NOVOLOG) 100 UNIT/ML injection Inject 30 Units into the skin 3 (three) times daily before meals.  . Insulin Degludec  (TRESIBA FLEXTOUCH) 200 UNIT/ML SOPN Inject 100 Units into the skin daily.  . metoprolol succinate (TOPROL-XL) 50 MG 24 hr tablet Take 50 mg by mouth daily.  . Multiple Vitamin (MULTIVITAMIN) tablet Take 1 tablet by mouth daily. Eye formula  . Omega-3 Fatty Acids (FISH OIL) 1000 MG CPDR Take 1 tablet by mouth daily.  Marland Kitchen omeprazole (PRILOSEC) 20 MG capsule Take 1 capsule (20 mg total) by mouth 2 (two) times daily before a meal. TAKE ONE CAPSULE BY MOUTH TWICE DAILy  . potassium chloride SA (KLOR-CON M20) 20 MEQ tablet Take 1 tablet (20 mEq total) by mouth once.  . quinapril (ACCUPRIL) 40 MG tablet Take 40 mg by mouth at bedtime.  . [DISCONTINUED] insulin glargine (LANTUS) 100 UNIT/ML injection Inject 0.8 mLs (80 Units total) into the skin at bedtime.  . [DISCONTINUED] Insulin Degludec (TRESIBA FLEXTOUCH) 200 UNIT/ML SOPN Inject 100 Units into the skin daily. (Patient not taking: Reported on 03/01/2015)   No facility-administered encounter medications on file as of 04/24/2015.      Review of Systems  Constitutional: Negative.   Respiratory: Negative.   Cardiovascular: Negative.   Neurological: Positive for numbness.  Psychiatric/Behavioral: Negative.        Objective:   Physical Exam  Constitutional: He is oriented to person, place, and time. He appears well-developed and well-nourished.  Cardiovascular: Normal rate and regular  rhythm.   Pulmonary/Chest: Effort normal.  Neurological: He is alert and oriented to person, place, and time.  Psychiatric: He has a normal mood and affect.          Assessment & Plan:  1. Coronary artery disease involving native coronary artery of native heart without angina pectoris Table at present. No recent chest pain  2. CKD (chronic kidney disease) stage 3, GFR 30-59 ml/min Continue to monitor kidney disease he is on quinapril for renal protection  3. Type 2 diabetes, uncontrolled, with neuropathy As noted above treatment is not optimal but is  better than none at all - POCT glycosylated hemoglobin (Hb A1C) - POCT urinalysis dipstick  4. Hyperlipidemia Continues on 80 mg of atorvastatin lipids were last checked 3 months ago  [name] MD

## 2015-05-01 ENCOUNTER — Other Ambulatory Visit: Payer: Self-pay | Admitting: Pharmacist

## 2015-05-01 MED ORDER — INSULIN DETEMIR 100 UNIT/ML FLEXPEN: 50.0000 [IU] | PEN_INJECTOR | Freq: Two times a day (BID) | SUBCUTANEOUS | Status: DC

## 2015-05-03 ENCOUNTER — Telehealth: Payer: Self-pay | Admitting: Pharmacist

## 2015-05-03 NOTE — Telephone Encounter (Signed)
Patient was given #1 samples of Tresiba u 100 pen.  I faxed Rx fro [employer] patient assistnce for Levemir as the Tyler Aas is not available through patient assistance.  Patient in informed of this change and voices understanding that Levemir will replace long acting [place] once received.

## 2015-05-10 ENCOUNTER — Telehealth: Payer: Self-pay | Admitting: Pharmacist

## 2015-05-10 NOTE — Telephone Encounter (Signed)
#  1 sample of Tresiba u-100 left for patient.  We have ordered Levemir from [facility] program but patient has not received yet.  Patient notified.

## 2015-05-14 ENCOUNTER — Telehealth: Payer: Self-pay | Admitting: Family Medicine

## 2015-05-14 NOTE — Telephone Encounter (Signed)
Patient aware we are out of samples and he is completely out and wants to know what we can do. Patient is in the doughnut hole also

## 2015-05-17 ENCOUNTER — Telehealth: Payer: Self-pay | Admitting: Family Medicine

## 2015-05-17 NOTE — Telephone Encounter (Signed)
Called [employer] regarding Levemir.  They are sending additional paperwork that needs to be filled out.  In meantime - I have left #2 pens of Tresiba for patient - he can take either 100 units daily or 50 units bid Patient aware

## 2015-05-23 ENCOUNTER — Encounter: Payer: Self-pay | Admitting: Family Medicine

## 2015-05-27 ENCOUNTER — Telehealth: Payer: Self-pay | Admitting: Pharmacist

## 2015-05-27 NOTE — Telephone Encounter (Signed)
Information faxed around 05/23/2015 to [facility] Patient Assistance program.   Expect that pharmacy will receive shipment around 05/30/15.  Until received - i have left sample of Tresiba U 200 for patient - inject 100 units once daily in place of Levemir.  This is a 6 day supply.

## 2015-05-28 ENCOUNTER — Encounter: Payer: Self-pay | Admitting: Pediatrics

## 2015-05-28 ENCOUNTER — Ambulatory Visit (INDEPENDENT_AMBULATORY_CARE_PROVIDER_SITE_OTHER): Payer: Medicare Other | Admitting: Pediatrics

## 2015-05-28 ENCOUNTER — Ambulatory Visit (INDEPENDENT_AMBULATORY_CARE_PROVIDER_SITE_OTHER): Payer: Medicare Other

## 2015-05-28 VITALS — BP 95/60 | HR 60 | Temp 97.2°F | Ht 66.0 in | Wt 230.4 lb

## 2015-05-28 DIAGNOSIS — R0781 Pleurodynia: Secondary | ICD-10-CM

## 2015-05-28 DIAGNOSIS — I10 Essential (primary) hypertension: Secondary | ICD-10-CM | POA: Diagnosis not present

## 2015-05-28 NOTE — Progress Notes (Signed)
Subjective:    Patient ID: Manuel Kline, male    DOB: Apr 04, 1932, 82 y.o.   MRN: [id number]  HPI: Manuel Kline is a 81 y.o. male presenting on 05/28/2015 for right side pain and dizzy headed  Last week has had two "bad" days of walking "like he was drunk" catching walls or doors. He says he feels lightheaded when this happens, does not feel like the room is spinning around him.  Blood glucoses has been around 150-200 the last two days, does not think he has had any lower than that.  Has not recently checked his BP at home.   Was getting into a truck and felt "bone on bone" on his R lower rib cage. Has not recently fallen, no known trauma. No prior problems.   Dr. Wynonia Lawman cardiologist   Relevant past medical, surgical, family and social history reviewed and updated as indicated. Interim medical history since our last visit reviewed. Allergies and medications reviewed and updated.   ROS: Per HPI unless specifically indicated above  Past Medical History Patient Active Problem List   Diagnosis Date Noted  . Diabetic peripheral neuropathy 01/31/2015  . CKD (chronic kidney disease) stage 3, GFR 30-59 ml/min 11/05/2014  . OSA on CPAP 11/05/2014  . Severe obesity (BMI >= 40) 11/05/2014  . Sleep apnea in adult   . CAD (coronary artery disease)   . Hypertensive heart disease without CHF   . Dual implantable cardioverter-defibrillator in situ   . Sleep apnea   . Hyperlipidemia   . Type 2 diabetes, uncontrolled, with neuropathy   . Morbid obesity   . Ischemic cardiomyopathy   . Chronic systolic heart failure   . S/P CABG (coronary artery bypass graft) 11/02/2000    Current Outpatient Prescriptions  Medication Sig Dispense Refill  . allopurinol (ZYLOPRIM) 300 MG tablet Take 0.5 tablets (150 mg total) by mouth daily. 45 tablet 1  . aspirin 81 MG tablet Take 81 mg by mouth daily.     Marland Kitchen atorvastatin (LIPITOR) 80 MG tablet Take 40 mg by mouth daily.    . furosemide (LASIX) 40 MG  tablet Take 1 tablet by mouth 2 (two) times daily.    Marland Kitchen gabapentin (NEURONTIN) 300 MG capsule TAKE ONE CAPSULE BY MOUTH TWICE DAILY 60 capsule 2  . glucose blood (ACCU-CHEK AVIVA) test strip 1 each by Other route 4 (four) times daily. DX: E11.40 100 each 5  . insulin aspart (NOVOLOG) 100 UNIT/ML injection Inject 30 Units into the skin 3 (three) times daily before meals. 30 mL 2  . Insulin Detemir (LEVEMIR FLEXTOUCH) 100 UNIT/ML Pen Inject 50 Units into the skin 2 (two) times daily. 90 mL 1  . metoprolol succinate (TOPROL-XL) 50 MG 24 hr tablet Take 50 mg by mouth daily.    . Multiple Vitamin (MULTIVITAMIN) tablet Take 1 tablet by mouth daily. Eye formula    . Omega-3 Fatty Acids (FISH OIL) 1000 MG CPDR Take 1 tablet by mouth daily.    Marland Kitchen omeprazole (PRILOSEC) 20 MG capsule Take 1 capsule (20 mg total) by mouth 2 (two) times daily before a meal. TAKE ONE CAPSULE BY MOUTH TWICE DAILy 60 capsule 4  . potassium chloride SA (KLOR-CON M20) 20 MEQ tablet Take 1 tablet (20 mEq total) by mouth once. 30 tablet 2  . quinapril (ACCUPRIL) 40 MG tablet Take 40 mg by mouth at bedtime.     No current facility-administered medications for this visit.       Objective:  BP 95/60 mmHg  Pulse 60  Temp(Src) 97.2 F (36.2 C) (Oral)  Ht 5' 6"  (1.676 m)  Wt 230 lb 6.4 oz (104.509 kg)  BMI 37.21 kg/m2  Wt Readings from Last 3 Encounters:  05/28/15 230 lb 6.4 oz (104.509 kg)  04/24/15 229 lb (103.874 kg)  03/19/15 230 lb (104.327 kg)     Gen: NAD, alert, cooperative with exam, NCAT EYES: EOMI, no scleral injection or icterus ENT:  TMs slightly erythematous b/l, no effusion, OP without erythema CV: NRRR, normal S1/S2, no murmur, DP pulses 2+ b/l Resp: CTABL, no wheezes, normal WOB Abd: +BS, soft, NTND Ext: No edema, warm Neuro: Alert and oriented, strength equal b/l UE and LE, intact cerebellar function, normal gait. Sensation intact b/l LE and UE. MSK: points to R lower rib as place of pain. No masses,  no redness, no swelling in area.  Imaging: CXR and R ribs: I personally reviewed the images, no effusion, no fracture, pt s/p sternotomy and pace maker placement.   Offical read:  "1. There is no active cardiopulmonary disease. 2. No acute right rib fracture is demonstrated."      Assessment & Plan:    Manuel Kline was seen today for right side pain and episodes of lightheadedness that are likely related to hypotension. Normally BPs have been 110s-120s at recent office visits, today 90s/60s. Per chart review, he was supposed to stop the clonidine several months ago, though pt has continued to take it. Will stop clonidine and he will take half a tab of accupril for the next two weeks, take BP at home, let me know if regularly elevated, will have him restart full tab of accupril. Not clear etiology for rib pain, dpes have point tenderness over R lower rib cage just anterior to axillary line, no abdominal pain, some pain with deep breaths. Will cotninue to watch. Checking BMP with change in meds, lightheadedness and pt due for recheck Cr.  Diagnoses and all orders for this visit:  Rib pain -     DG Ribs Unilateral W/Chest Right; Future  Essential hypertension  -     BMP8+EGFR  CKD No new symptoms, regularly urinating.   Follow up plan: Return in about 2 weeks (around 06/11/2015) for blood pressure recheck.  Assunta Found, MD [facility] 05/28/2015, 10:57 AM

## 2015-05-28 NOTE — Patient Instructions (Addendum)
   Take your blood pressure every day and write down the numbers you get.  Let me know if your blood pressure is >140 or >90 on bottom.  Stop taking the clonidine.  Take half a tab of the accupril.  Come back in 2 weeks for blood pressure recheck.

## 2015-05-29 LAB — BMP8+EGFR
BUN / CREAT RATIO: 21 (ref 10–22)
BUN: 60 mg/dL — ABNORMAL HIGH (ref 8–27)
CO2: 24 mmol/L (ref 18–29)
CREATININE: 2.8 mg/dL — AB (ref 0.76–1.27)
Calcium: 9.3 mg/dL (ref 8.6–10.2)
Chloride: 100 mmol/L (ref 97–108)
GFR, EST AFRICAN AMERICAN: 23 mL/min/{1.73_m2} — AB (ref >59)
GFR, EST NON AFRICAN AMERICAN: 20 mL/min/{1.73_m2} — AB (ref >59)
Glucose: 89 mg/dL (ref 65–99)
Potassium: 5.3 mmol/L — ABNORMAL HIGH (ref 3.5–5.2)
SODIUM: 142 mmol/L (ref 134–144)

## 2015-06-03 ENCOUNTER — Telehealth: Payer: Self-pay | Admitting: Pharmacist

## 2015-06-03 NOTE — Telephone Encounter (Signed)
Levemir from patient assistance program has been delivered to [facility].  Patient can pick up at their convenience.

## 2015-06-04 ENCOUNTER — Telehealth: Payer: Self-pay | Admitting: Pediatrics

## 2015-06-04 DIAGNOSIS — E875 Hyperkalemia: Secondary | ICD-10-CM

## 2015-06-04 NOTE — Telephone Encounter (Signed)
Appointment made

## 2015-06-04 NOTE — Telephone Encounter (Signed)
Called pt to discuss his recent labs. Creatinine is up, potassium is up. BPs at home over the past few days have been 120s-140s/60s with a HR in 60s. He says he has felt better than he has in a while since stopping the clonidine, decreasing the accupril in half. No more lightheadedness. I suspect creatinine was up from hypoperfusion from low BPs, potassium up as pt has been taking potassium supplements. Rec stopping potassium supplement. He is going to continue to take 20mg  of accupril and will come back for BP and BMP recheck in 2 weeks. If creatinine/K not back to baseline, may need to stop accupril, could try low dose CCB if more BP control is needed. He has a f/u appt with nephrology in 2 months.

## 2015-06-07 ENCOUNTER — Telehealth: Payer: Self-pay | Admitting: Pediatrics

## 2015-06-07 DIAGNOSIS — N184 Chronic kidney disease, stage 4 (severe): Secondary | ICD-10-CM

## 2015-06-07 NOTE — Telephone Encounter (Signed)
Called back, no one answered, left a message, will try again.

## 2015-06-07 NOTE — Telephone Encounter (Signed)
Called pt back, past few days BP has been 140s/60s-70s, he read off the numbers on the phone. Yesterday he ate much more salt than usual. He is feeling fine today, no HA, vision changes or other symptoms. Will keep checking BPs over the weekend, checking when he has been sitting down for 10-15 minutes calmly, avoiding salt. Has f/u appt next week. Needs repeat BMP.

## 2015-06-10 DIAGNOSIS — Z95810 Presence of automatic (implantable) cardiac defibrillator: Secondary | ICD-10-CM | POA: Diagnosis not present

## 2015-06-18 ENCOUNTER — Ambulatory Visit (INDEPENDENT_AMBULATORY_CARE_PROVIDER_SITE_OTHER): Payer: Medicare Other | Admitting: *Deleted

## 2015-06-18 VITALS — BP 109/60 | HR 58

## 2015-06-18 DIAGNOSIS — I10 Essential (primary) hypertension: Secondary | ICD-10-CM | POA: Diagnosis not present

## 2015-06-18 DIAGNOSIS — N184 Chronic kidney disease, stage 4 (severe): Secondary | ICD-10-CM

## 2015-06-18 NOTE — Progress Notes (Signed)
Pt here today for follow up BP check and to have BMP redrawn.

## 2015-06-19 LAB — BMP8+EGFR
BUN/Creatinine Ratio: 16 (ref 10–22)
BUN: 35 mg/dL — ABNORMAL HIGH (ref 8–27)
CALCIUM: 9.2 mg/dL (ref 8.6–10.2)
CO2: 24 mmol/L (ref 18–29)
CREATININE: 2.15 mg/dL — AB (ref 0.76–1.27)
Chloride: 100 mmol/L (ref 97–106)
GFR calc Af Amer: 32 mL/min/{1.73_m2} — ABNORMAL LOW (ref >59)
GFR, EST NON AFRICAN AMERICAN: 28 mL/min/{1.73_m2} — AB (ref >59)
GLUCOSE: 78 mg/dL (ref 65–99)
POTASSIUM: 4.1 mmol/L (ref 3.5–5.2)
SODIUM: 142 mmol/L (ref 136–144)

## 2015-06-24 ENCOUNTER — Ambulatory Visit (INDEPENDENT_AMBULATORY_CARE_PROVIDER_SITE_OTHER): Payer: Medicare Other

## 2015-06-24 DIAGNOSIS — Z23 Encounter for immunization: Secondary | ICD-10-CM | POA: Diagnosis not present

## 2015-07-05 DIAGNOSIS — G4733 Obstructive sleep apnea (adult) (pediatric): Secondary | ICD-10-CM | POA: Diagnosis not present

## 2015-07-09 ENCOUNTER — Other Ambulatory Visit: Payer: Self-pay | Admitting: Family Medicine

## 2015-07-26 ENCOUNTER — Encounter: Payer: Self-pay | Admitting: Family Medicine

## 2015-07-26 ENCOUNTER — Ambulatory Visit (INDEPENDENT_AMBULATORY_CARE_PROVIDER_SITE_OTHER): Payer: Medicare Other | Admitting: Family Medicine

## 2015-07-26 VITALS — BP 126/71 | HR 68 | Temp 97.0°F | Ht 66.0 in | Wt 230.4 lb

## 2015-07-26 DIAGNOSIS — J209 Acute bronchitis, unspecified: Secondary | ICD-10-CM | POA: Diagnosis not present

## 2015-07-26 MED ORDER — AZITHROMYCIN 250 MG PO TABS: ORAL_TABLET | ORAL | Status: DC

## 2015-07-26 MED ORDER — FLUTICASONE PROPIONATE 50 MCG/ACT NA SUSP: 1.0000 | Freq: Two times a day (BID) | NASAL | Status: DC | PRN

## 2015-07-26 NOTE — Progress Notes (Signed)
BP 126/71 mmHg  Pulse 68  Temp(Src) 97 F (36.1 C) (Oral)  Ht 5' 6"  (1.676 m)  Wt 230 lb 6.4 oz (104.509 kg)  BMI 37.21 kg/m2   Subjective:    Patient ID: Manuel Kline, male    DOB: 1932/08/18, 81 y.o.   MRN: [id number]  HPI: Manuel Kline is a 81 y.o. male presenting on 07/26/2015 for Bronchitis   HPI Cough and chest congestion Patient is been having cough and chest congestion for the past week. He has tried some over-the-counter decongestant and that was recommended by the pharmacist that wouldn't affect his blood pressure. This has not helped much. He denies any fevers or chills. He has cough productive of yellow-green sputum. He also complains of sinus pressure and nasal congestion and postnasal drainage. He does not know of any sick contacts.  Relevant past medical, surgical, family and social history reviewed and updated as indicated. Interim medical history since our last visit reviewed. Allergies and medications reviewed and updated.  Review of Systems  Constitutional: Negative for fever and chills.  HENT: Positive for congestion, postnasal drip, rhinorrhea, sinus pressure and sore throat. Negative for ear discharge, ear pain, sneezing and voice change.   Eyes: Negative for pain, discharge, redness and visual disturbance.  Respiratory: Positive for cough. Negative for chest tightness, shortness of breath and wheezing.   Cardiovascular: Negative for chest pain and leg swelling.  Gastrointestinal: Negative for abdominal pain, diarrhea and constipation.  Genitourinary: Negative for difficulty urinating.  Musculoskeletal: Negative for back pain and gait problem.  Skin: Negative for rash.  Neurological: Negative for syncope, light-headedness and headaches.  All other systems reviewed and are negative.   Per HPI unless specifically indicated above     Medication List       This list is accurate as of: 07/26/15 10:56 AM.  Always use your most recent med list.                 allopurinol 300 MG tablet  Commonly known as:  ZYLOPRIM  Take 0.5 tablets (150 mg total) by mouth daily.     aspirin 81 MG tablet  Take 81 mg by mouth daily.     atorvastatin 80 MG tablet  Commonly known as:  LIPITOR  Take 40 mg by mouth daily.     azithromycin 250 MG tablet  Commonly known as:  ZITHROMAX  Take 2 the first day and then one each day after.     Fish Oil 1000 MG Cpdr  Take 1 tablet by mouth daily.     fluticasone 50 MCG/ACT nasal spray  Commonly known as:  FLONASE  Place 1 spray into both nostrils 2 (two) times daily as needed for allergies or rhinitis.     furosemide 40 MG tablet  Commonly known as:  LASIX  Take 1 tablet by mouth 2 (two) times daily.     gabapentin 300 MG capsule  Commonly known as:  NEURONTIN  TAKE ONE CAPSULE BY MOUTH TWICE DAILY     glucose blood test strip  Commonly known as:  ACCU-CHEK AVIVA  1 each by Other route 4 (four) times daily. DX: E11.40     insulin aspart 100 UNIT/ML injection  Commonly known as:  NOVOLOG  Inject 30 Units into the skin 3 (three) times daily before meals.     Insulin Detemir 100 UNIT/ML Pen  Commonly known as:  LEVEMIR FLEXTOUCH  Inject 50 Units into the skin 2 (two) times daily.  metoprolol succinate 50 MG 24 hr tablet  Commonly known as:  TOPROL-XL  Take 50 mg by mouth daily.     multivitamin tablet  Take 1 tablet by mouth daily. Eye formula     omeprazole 20 MG capsule  Commonly known as:  PRILOSEC  Take 1 capsule (20 mg total) by mouth 2 (two) times daily before a meal. TAKE ONE CAPSULE BY MOUTH TWICE DAILy     quinapril 40 MG tablet  Commonly known as:  ACCUPRIL  Take 20 mg by mouth at bedtime.           Objective:    BP 126/71 mmHg  Pulse 68  Temp(Src) 97 F (36.1 C) (Oral)  Ht 5' 6"  (1.676 m)  Wt 230 lb 6.4 oz (104.509 kg)  BMI 37.21 kg/m2  Wt Readings from Last 3 Encounters:  07/26/15 230 lb 6.4 oz (104.509 kg)  05/28/15 230 lb 6.4 oz (104.509 kg)  04/24/15 229 lb  (103.874 kg)    Physical Exam  Constitutional: He is oriented to person, place, and time. He appears well-developed and well-nourished. No distress.  HENT:  Right Ear: Tympanic membrane, external ear and ear canal normal.  Left Ear: Tympanic membrane, external ear and ear canal normal.  Nose: Mucosal edema and rhinorrhea present. No sinus tenderness. No epistaxis. Right sinus exhibits maxillary sinus tenderness. Right sinus exhibits no frontal sinus tenderness. Left sinus exhibits maxillary sinus tenderness. Left sinus exhibits no frontal sinus tenderness.  Mouth/Throat: Uvula is midline and mucous membranes are normal. Posterior oropharyngeal edema and posterior oropharyngeal erythema present. No oropharyngeal exudate or tonsillar abscesses.  Eyes: Conjunctivae and EOM are normal. Pupils are equal, round, and reactive to light. Right eye exhibits no discharge. No scleral icterus.  Neck: Neck supple. No thyromegaly present.  Cardiovascular: Normal rate, regular rhythm, normal heart sounds and intact distal pulses.   No murmur heard. Pulmonary/Chest: Effort normal and breath sounds normal. No respiratory distress. He has no wheezes. He has no rales. He exhibits no tenderness.  Musculoskeletal: Normal range of motion. He exhibits no edema.  Lymphadenopathy:    He has no cervical adenopathy.  Neurological: He is alert and oriented to person, place, and time. Coordination normal.  Skin: Skin is warm and dry. No rash noted. He is not diaphoretic.  Psychiatric: He has a normal mood and affect. His behavior is normal.  Vitals reviewed.   Results for orders placed or performed in visit on 06/18/15  [facility]  Result Value Ref Range   Glucose 78 65 - 99 mg/dL   BUN 35 (H) 8 - 27 mg/dL   Creatinine, Ser 2.15 (H) 0.76 - 1.27 mg/dL   GFR calc non Af Amer 28 (L) >59 mL/min/1.73   GFR calc Af Amer 32 (L) >59 mL/min/1.73   BUN/Creatinine Ratio 16 10 - 22   Sodium 142 136 - 144 mmol/L   Potassium  4.1 3.5 - 5.2 mmol/L   Chloride 100 97 - 106 mmol/L   CO2 24 18 - 29 mmol/L   Calcium 9.2 8.6 - 10.2 mg/dL      Assessment & Plan:   Problem List Items Addressed This Visit    None    Visit Diagnoses    Acute bronchitis, unspecified organism    -  Primary    Relevant Medications    fluticasone (FLONASE) 50 MCG/ACT nasal spray    azithromycin (ZITHROMAX) 250 MG tablet        Follow up plan: Return if symptoms  worsen or fail to improve.  Counseling provided for all of the vaccine components No orders of the defined types were placed in this encounter.    Caryl Pina, MD [facility] 07/26/2015, 10:56 AM

## 2015-08-05 ENCOUNTER — Other Ambulatory Visit: Payer: Self-pay | Admitting: Family Medicine

## 2015-08-13 DIAGNOSIS — E119 Type 2 diabetes mellitus without complications: Secondary | ICD-10-CM | POA: Diagnosis not present

## 2015-08-13 DIAGNOSIS — N184 Chronic kidney disease, stage 4 (severe): Secondary | ICD-10-CM | POA: Diagnosis not present

## 2015-08-13 DIAGNOSIS — Z131 Encounter for screening for diabetes mellitus: Secondary | ICD-10-CM | POA: Diagnosis not present

## 2015-08-15 DIAGNOSIS — H04123 Dry eye syndrome of bilateral lacrimal glands: Secondary | ICD-10-CM | POA: Diagnosis not present

## 2015-08-15 DIAGNOSIS — H540 Blindness, both eyes: Secondary | ICD-10-CM | POA: Diagnosis not present

## 2015-08-15 DIAGNOSIS — H40013 Open angle with borderline findings, low risk, bilateral: Secondary | ICD-10-CM | POA: Diagnosis not present

## 2015-08-15 DIAGNOSIS — H01003 Unspecified blepharitis right eye, unspecified eyelid: Secondary | ICD-10-CM | POA: Diagnosis not present

## 2015-08-19 DIAGNOSIS — I119 Hypertensive heart disease without heart failure: Secondary | ICD-10-CM | POA: Diagnosis not present

## 2015-08-19 DIAGNOSIS — I2510 Atherosclerotic heart disease of native coronary artery without angina pectoris: Secondary | ICD-10-CM | POA: Diagnosis not present

## 2015-08-19 DIAGNOSIS — I5022 Chronic systolic (congestive) heart failure: Secondary | ICD-10-CM | POA: Diagnosis not present

## 2015-08-19 DIAGNOSIS — I255 Ischemic cardiomyopathy: Secondary | ICD-10-CM | POA: Diagnosis not present

## 2015-08-29 ENCOUNTER — Encounter: Payer: Self-pay | Admitting: Pediatrics

## 2015-08-29 ENCOUNTER — Ambulatory Visit (INDEPENDENT_AMBULATORY_CARE_PROVIDER_SITE_OTHER): Payer: Medicare Other | Admitting: Pediatrics

## 2015-08-29 ENCOUNTER — Ambulatory Visit (INDEPENDENT_AMBULATORY_CARE_PROVIDER_SITE_OTHER): Payer: Medicare Other

## 2015-08-29 VITALS — BP 142/73 | HR 81 | Temp 97.1°F | Ht 66.0 in | Wt 230.0 lb

## 2015-08-29 DIAGNOSIS — E785 Hyperlipidemia, unspecified: Secondary | ICD-10-CM

## 2015-08-29 DIAGNOSIS — R112 Nausea with vomiting, unspecified: Secondary | ICD-10-CM

## 2015-08-29 DIAGNOSIS — N183 Chronic kidney disease, stage 3 unspecified: Secondary | ICD-10-CM

## 2015-08-29 DIAGNOSIS — E1165 Type 2 diabetes mellitus with hyperglycemia: Secondary | ICD-10-CM

## 2015-08-29 DIAGNOSIS — IMO0002 Reserved for concepts with insufficient information to code with codable children: Secondary | ICD-10-CM

## 2015-08-29 DIAGNOSIS — E1142 Type 2 diabetes mellitus with diabetic polyneuropathy: Secondary | ICD-10-CM

## 2015-08-29 DIAGNOSIS — I2510 Atherosclerotic heart disease of native coronary artery without angina pectoris: Secondary | ICD-10-CM

## 2015-08-29 DIAGNOSIS — E1140 Type 2 diabetes mellitus with diabetic neuropathy, unspecified: Secondary | ICD-10-CM | POA: Diagnosis not present

## 2015-08-29 LAB — CBC WITH DIFFERENTIAL/PLATELET
Basophils Absolute: 0 10*3/uL (ref 0.0–0.2)
Basos: 0 %
EOS (ABSOLUTE): 0.1 10*3/uL (ref 0.0–0.4)
EOS: 1 %
HEMATOCRIT: 40.7 % (ref 37.5–51.0)
Hemoglobin: 14.1 g/dL (ref 12.6–17.7)
Immature Grans (Abs): 0 10*3/uL (ref 0.0–0.1)
Immature Granulocytes: 0 %
LYMPHS ABS: 0.5 10*3/uL — AB (ref 0.7–3.1)
Lymphs: 5 %
MCH: 30 pg (ref 26.6–33.0)
MCHC: 34.6 g/dL (ref 31.5–35.7)
MCV: 87 fL (ref 79–97)
MONOS ABS: 0.4 10*3/uL (ref 0.1–0.9)
Monocytes: 4 %
NEUTROS ABS: 9.7 10*3/uL — AB (ref 1.4–7.0)
NEUTROS PCT: 90 %
Platelets: 183 10*3/uL (ref 150–379)
RBC: 4.7 x10E6/uL (ref 4.14–5.80)
RDW: 14.5 % (ref 12.3–15.4)
WBC: 10.7 10*3/uL (ref 3.4–10.8)

## 2015-08-29 LAB — CMP14+EGFR
A/G RATIO: 1.8 (ref 1.1–2.5)
ALK PHOS: 128 IU/L — AB (ref 39–117)
ALT: 21 IU/L (ref 0–44)
AST: 17 IU/L (ref 0–40)
Albumin: 4.5 g/dL (ref 3.5–4.7)
BILIRUBIN TOTAL: 0.5 mg/dL (ref 0.0–1.2)
BUN/Creatinine Ratio: 21 (ref 10–22)
BUN: 39 mg/dL — ABNORMAL HIGH (ref 8–27)
CHLORIDE: 94 mmol/L — AB (ref 96–106)
CO2: 21 mmol/L (ref 18–29)
CREATININE: 1.9 mg/dL — AB (ref 0.76–1.27)
Calcium: 8.5 mg/dL — ABNORMAL LOW (ref 8.6–10.2)
GFR calc Af Amer: 37 mL/min/{1.73_m2} — ABNORMAL LOW (ref >59)
GFR calc non Af Amer: 32 mL/min/{1.73_m2} — ABNORMAL LOW (ref >59)
GLOBULIN, TOTAL: 2.5 g/dL (ref 1.5–4.5)
Glucose: 315 mg/dL — ABNORMAL HIGH (ref 65–99)
POTASSIUM: 5.4 mmol/L — AB (ref 3.5–5.2)
SODIUM: 137 mmol/L (ref 134–144)
Total Protein: 7 g/dL (ref 6.0–8.5)

## 2015-08-29 LAB — GLUCOSE, POCT (MANUAL RESULT ENTRY): POC GLUCOSE: 302 mg/dL — AB (ref 70–99)

## 2015-08-29 LAB — POCT GLYCOSYLATED HEMOGLOBIN (HGB A1C): Hemoglobin A1C: 8.5

## 2015-08-29 MED ORDER — ONDANSETRON 4 MG PO TBDP: 4.0000 mg | ORAL_TABLET | Freq: Two times a day (BID) | ORAL | Status: DC

## 2015-08-29 NOTE — Patient Instructions (Signed)
Take lots of small sips of water.  If vomiting continues you need to go to emergency room. If you are feeling any worse you should go to the emergency room. I will call you with results of labs and x-ray.

## 2015-08-29 NOTE — Progress Notes (Signed)
Subjective:    Patient ID: Manuel Kline, male    DOB: 02-10-1932, 81 y.o.   MRN: [id number]  CC: Follow-up; Emesis; and Cough   HPI: Manuel Kline is a 81 y.o. male presenting for Follow-up; Emesis;  and Cough  Throwing up starting this morning, 3-4 times Nose is running all the time  Coughing some the last few days but says he has been feeling more or less normal self, was out doing heavy yard work yesterday. Denies abdominal pain, shortness of breath, chest pain.  Toe nail cut off for ingrown toe nail several years ago, has been hurting since then. Not red or swollen.   Depression screen [facility] 2/9 08/29/2015 07/26/2015 03/01/2015 10/16/2014  Decreased Interest 0 0 0 0  Down, Depressed, Hopeless 0 0 0 0  PHQ - 2 Score 0 0 0 0     Relevant past medical, surgical, family and social history reviewed and updated as indicated. Interim medical history since our last visit reviewed. Allergies and medications reviewed and updated.    ROS: Per HPI unless specifically indicated above  History  Smoking status  . Never Smoker   Smokeless tobacco  . Never Used    Past Medical History Patient Active Problem List   Diagnosis Date Noted  . Diabetic peripheral neuropathy ([place]) 01/31/2015  . CKD (chronic kidney disease) stage 3, GFR 30-59 ml/min 11/05/2014  . OSA on CPAP 11/05/2014  . Severe obesity (BMI >= 40) ([place]) 11/05/2014  . Sleep apnea in adult   . CAD (coronary artery disease)   . Hypertensive heart disease without CHF   . Dual implantable cardioverter-defibrillator in situ   . Sleep apnea   . Hyperlipidemia   . Type 2 diabetes, uncontrolled, with neuropathy ([place])   . Morbid obesity ([place])   . Ischemic cardiomyopathy   . Chronic systolic heart failure ([place])   . S/P CABG (coronary artery bypass graft) 11/02/2000    Current Outpatient Prescriptions  Medication Sig Dispense Refill  . allopurinol (ZYLOPRIM) 300 MG tablet Take 0.5 tablets (150 mg total) by mouth daily. 45  tablet 1  . aspirin 81 MG tablet Take 81 mg by mouth daily.     Marland Kitchen atorvastatin (LIPITOR) 80 MG tablet Take 40 mg by mouth daily.    . fluticasone (FLONASE) 50 MCG/ACT nasal spray Place 1 spray into both nostrils 2 (two) times daily as needed for allergies or rhinitis. 16 g 6  . furosemide (LASIX) 40 MG tablet Take 1 tablet by mouth 2 (two) times daily.    Marland Kitchen gabapentin (NEURONTIN) 300 MG capsule TAKE ONE CAPSULE BY MOUTH TWICE DAILY 60 capsule 2  . glucose blood (ACCU-CHEK AVIVA) test strip 1 each by Other route 4 (four) times daily. DX: E11.40 100 each 5  . insulin aspart (NOVOLOG) 100 UNIT/ML injection Inject 30 Units into the skin 3 (three) times daily before meals. 30 mL 2  . Insulin Detemir (LEVEMIR FLEXTOUCH) 100 UNIT/ML Pen Inject 50 Units into the skin 2 (two) times daily. 90 mL 1  . metoprolol succinate (TOPROL-XL) 50 MG 24 hr tablet Take 50 mg by mouth daily.    . Multiple Vitamin (MULTIVITAMIN) tablet Take 1 tablet by mouth daily. Eye formula    . Omega-3 Fatty Acids (FISH OIL) 1000 MG CPDR Take 1 tablet by mouth daily.    Marland Kitchen omeprazole (PRILOSEC) 20 MG capsule Take 1 capsule (20 mg total) by mouth 2 (two) times daily before a meal. TAKE ONE CAPSULE  BY MOUTH TWICE DAILy 60 capsule 4  . quinapril (ACCUPRIL) 40 MG tablet Take 20 mg by mouth at bedtime.     No current facility-administered medications for this visit.       Objective:    BP 142/73 mmHg  Pulse 81  Temp(Src) 97.1 F (36.2 C) (Oral)  Ht _0  (1.676 m)  Wt 230 lb (104.327 kg)  BMI 37.14 kg/m2  SpO2 98%  Wt Readings from Last 3 Encounters:  08/29/15 230 lb (104.327 kg)  07/26/15 230 lb 6.4 oz (104.509 kg)  05/28/15 230 lb 6.4 oz (104.509 kg)     Gen: intermittently vomiting, alert, cooperative with exam, NCAT EYES: EOMI, no scleral injection or icterus ENT:  TMs pearly gray b/l, OP without erythema, MMM LYMPH: no cervical LAD CV: NRRR, normal S1/S2, no murmur, distal pulses 2+ b/l Resp: few crackles at bases  of lungs b/l, no wheezes, moving air well, normal WOB Abd: +BS, soft, NTND. no guarding or organomegaly Ext: No edema, warm Neuro: Alert and oriented, strength equal b/l UE and LE, coordination grossly normal MSK: normal muscle bulk     Assessment & Plan:    Vito, 84yoM with h/o CAD, CKD stage 3, DM2, HTN was seen today with vomiting that started this morning. Emesis is non-bloody, non-bilious. No abd pain, no diarrhea. No sick contacts at home. Feeling well until it started this morning at 4am. Multiple episodes of emesis in clinic. BGL 300 in clinic. Gave 90m of ODT zofran. Pt appears euvolemic, denies chest pain, SOB. Will get CXR because of crackles on exam and Abd film due to vomiting. Discussed with his wife by phone, a family member will come pick him up. Sending labs stat but still will not be back for hours. If vomiting continues they should go to emergency room.  Diagnoses and all orders for this visit:  Type 2 diabetes, uncontrolled, with neuropathy ([place]  Hyperlipidemia  Coronary artery disease involving native coronary artery of native heart without angina pectoris  CKD (chronic kidney disease) stage 3, GFR 30-59 ml/min  Diabetic peripheral neuropathy ([facility])  Nausea and vomiting, intractability of vomiting not specified, unspecified vomiting type -     POCT glucose (manual entry) -     POCT glycosylated hemoglobin (Hb A1C) -     DG Chest 2 View; Future -     DG Abd 1 View; Future -     CMP14+EGFR -     CBC with Differential -     ondansetron (ZOFRAN-ODT) 4 MG disintegrating tablet; Take 1 tablet (4 mg total) by mouth every 12 (twelve) hours.     Follow up plan: Return in about 1 week (around 09/05/2015) for follow up, sooner if needed.  CAssunta Found MD [facility] 08/29/2015, 8:59 AM

## 2015-09-02 DIAGNOSIS — Z961 Presence of intraocular lens: Secondary | ICD-10-CM | POA: Diagnosis not present

## 2015-09-02 DIAGNOSIS — H04123 Dry eye syndrome of bilateral lacrimal glands: Secondary | ICD-10-CM | POA: Diagnosis not present

## 2015-09-02 DIAGNOSIS — H40013 Open angle with borderline findings, low risk, bilateral: Secondary | ICD-10-CM | POA: Diagnosis not present

## 2015-09-02 DIAGNOSIS — H26492 Other secondary cataract, left eye: Secondary | ICD-10-CM | POA: Diagnosis not present

## 2015-09-09 DIAGNOSIS — Z95810 Presence of automatic (implantable) cardiac defibrillator: Secondary | ICD-10-CM | POA: Diagnosis not present

## 2015-09-11 ENCOUNTER — Ambulatory Visit (INDEPENDENT_AMBULATORY_CARE_PROVIDER_SITE_OTHER): Payer: Medicare Other | Admitting: Podiatry

## 2015-09-11 ENCOUNTER — Encounter: Payer: Self-pay | Admitting: Podiatry

## 2015-09-11 VITALS — BP 170/86 | HR 62 | Resp 16

## 2015-09-11 DIAGNOSIS — E1140 Type 2 diabetes mellitus with diabetic neuropathy, unspecified: Secondary | ICD-10-CM

## 2015-09-11 DIAGNOSIS — B351 Tinea unguium: Secondary | ICD-10-CM | POA: Diagnosis not present

## 2015-09-11 DIAGNOSIS — M216X9 Other acquired deformities of unspecified foot: Secondary | ICD-10-CM

## 2015-09-11 DIAGNOSIS — M79673 Pain in unspecified foot: Secondary | ICD-10-CM | POA: Diagnosis not present

## 2015-09-11 DIAGNOSIS — M79609 Pain in unspecified limb: Principal | ICD-10-CM

## 2015-09-13 NOTE — Progress Notes (Signed)
Subjective:     Patient ID: Manuel Kline, male   DOB: 04-01-1932, 82 y.o.   MRN: [id number]  HPI patient presents with long-term diabetes with significant nail disease with reduced sensation and structural deformity of both feet with pressure against the metatarsal bones of both feet with history of utilizing diabetic shoes with success   Review of Systems     Objective:   Physical Exam  neurovascular status found to be diminished but intact and unchanged from previous visit with thick yellow brittle nailbeds 1-5 both feet that are painful and moderate cavus deformity with plantar flex metatarsals and lesion formation    Assessment:      at risk diabetic with numerous health problems and history of pre-ulcerated of type lesions    Plan:      reviewed condition and debrided painful nailbeds 1-5 both feet and recommended long-term diabetic shoes to help prevent ulceration in future

## 2015-09-17 ENCOUNTER — Telehealth: Payer: Self-pay | Admitting: Pharmacist

## 2015-09-18 MED ORDER — INSULIN ASPART 100 UNIT/ML ~~LOC~~ SOLN: 30.0000 [IU] | Freq: Three times a day (TID) | SUBCUTANEOUS | Status: DC

## 2015-09-18 MED ORDER — INSULIN DETEMIR 100 UNIT/ML FLEXPEN: 50.0000 [IU] | PEN_INJECTOR | Freq: Two times a day (BID) | SUBCUTANEOUS | Status: DC

## 2015-09-18 NOTE — Telephone Encounter (Signed)
Patient is not usually eligible to receive medication through patient assistance program from [employer] until he reaches Medicare coverage gap.  Explained this to patients wife and she asked that insulin Rxs be sent into [facility]- done

## 2015-09-19 ENCOUNTER — Other Ambulatory Visit: Payer: Self-pay | Admitting: Pharmacist

## 2015-09-19 ENCOUNTER — Telehealth: Payer: Self-pay | Admitting: Pediatrics

## 2015-09-19 MED ORDER — INSULIN LISPRO 100 UNIT/ML (KWIKPEN)
30.0000 [IU] | PEN_INJECTOR | Freq: Three times a day (TID) | SUBCUTANEOUS | Status: DC
Start: 2015-09-19 — End: 2015-10-04

## 2015-09-19 NOTE — Telephone Encounter (Signed)
Please let p tknow--I sent in insulin lispro kwikpens. Let me know if that is too expensive. Sometimes when insurances change preferences like that they send something in the mail to say what the new preferred medicine is. Does he remember getting anything like that?

## 2015-09-19 NOTE — Telephone Encounter (Signed)
Patient aware.

## 2015-10-02 ENCOUNTER — Encounter: Payer: Self-pay | Admitting: *Deleted

## 2015-10-04 ENCOUNTER — Telehealth: Payer: Self-pay | Admitting: Pediatrics

## 2015-10-04 MED ORDER — INSULIN DETEMIR 100 UNIT/ML FLEXPEN: 50.0000 [IU] | PEN_INJECTOR | Freq: Two times a day (BID) | SUBCUTANEOUS | Status: DC

## 2015-10-04 MED ORDER — INSULIN LISPRO 100 UNIT/ML (KWIKPEN): 30.0000 [IU] | PEN_INJECTOR | Freq: Three times a day (TID) | SUBCUTANEOUS | Status: DC

## 2015-10-04 NOTE — Telephone Encounter (Signed)
Would like 90 day Rx sent in to pharmacy for his insulin scripts. Will only up 30 days worth if not able to afford higher copay.

## 2015-10-19 DIAGNOSIS — G4733 Obstructive sleep apnea (adult) (pediatric): Secondary | ICD-10-CM | POA: Diagnosis not present

## 2015-10-21 DIAGNOSIS — H26491 Other secondary cataract, right eye: Secondary | ICD-10-CM | POA: Diagnosis not present

## 2015-10-21 DIAGNOSIS — G4730 Sleep apnea, unspecified: Secondary | ICD-10-CM | POA: Diagnosis not present

## 2015-10-21 DIAGNOSIS — E113393 Type 2 diabetes mellitus with moderate nonproliferative diabetic retinopathy without macular edema, bilateral: Secondary | ICD-10-CM | POA: Diagnosis not present

## 2015-10-30 ENCOUNTER — Telehealth: Payer: Self-pay | Admitting: Pediatrics

## 2015-10-30 ENCOUNTER — Encounter: Payer: Self-pay | Admitting: *Deleted

## 2015-10-30 MED ORDER — INSULIN LISPRO 100 UNIT/ML ~~LOC~~ SOLN: 30.0000 [IU] | Freq: Three times a day (TID) | SUBCUTANEOUS | Status: DC

## 2015-10-30 MED ORDER — INSULIN DETEMIR 100 UNIT/ML ~~LOC~~ SOLN: 50.0000 [IU] | Freq: Two times a day (BID) | SUBCUTANEOUS | Status: DC

## 2015-10-30 NOTE — Telephone Encounter (Signed)
I sent in vials like we discussed this morning because he said the pens were $300 for a month for both levemir and humalog, it was to see if it was any cheaper. I am happy to send in the pens if that is what he prefers.

## 2015-10-31 NOTE — Telephone Encounter (Signed)
Pt aware insulin VIALS were sent to pharmacy yesterday, he will check w/ [facility] to see if this is going to be cheaper than the pens.

## 2015-11-01 ENCOUNTER — Encounter: Payer: Self-pay | Admitting: Pediatrics

## 2015-11-01 ENCOUNTER — Ambulatory Visit (INDEPENDENT_AMBULATORY_CARE_PROVIDER_SITE_OTHER): Payer: Medicare Other | Admitting: Pediatrics

## 2015-11-01 VITALS — BP 144/68 | HR 58 | Temp 96.9°F | Ht 66.0 in | Wt 230.6 lb

## 2015-11-01 DIAGNOSIS — M545 Low back pain, unspecified: Secondary | ICD-10-CM

## 2015-11-01 DIAGNOSIS — R399 Unspecified symptoms and signs involving the genitourinary system: Secondary | ICD-10-CM

## 2015-11-01 DIAGNOSIS — IMO0002 Reserved for concepts with insufficient information to code with codable children: Secondary | ICD-10-CM

## 2015-11-01 DIAGNOSIS — E1165 Type 2 diabetes mellitus with hyperglycemia: Secondary | ICD-10-CM

## 2015-11-01 DIAGNOSIS — E1140 Type 2 diabetes mellitus with diabetic neuropathy, unspecified: Secondary | ICD-10-CM

## 2015-11-01 LAB — POCT URINALYSIS DIPSTICK
BILIRUBIN UA: NEGATIVE
Blood, UA: NEGATIVE
GLUCOSE UA: NEGATIVE
KETONES UA: NEGATIVE
LEUKOCYTES UA: NEGATIVE
Nitrite, UA: NEGATIVE
PH UA: 5
Protein, UA: NEGATIVE
Spec Grav, UA: 1.01
Urobilinogen, UA: NEGATIVE

## 2015-11-01 LAB — POCT UA - MICROSCOPIC ONLY
BACTERIA, U MICROSCOPIC: NEGATIVE
CASTS, UR, LPF, POC: NEGATIVE
CRYSTALS, UR, HPF, POC: NEGATIVE
Epithelial cells, urine per micros: NEGATIVE
RBC, URINE, MICROSCOPIC: NEGATIVE
WBC, Ur, HPF, POC: NEGATIVE
YEAST UA: NEGATIVE

## 2015-11-01 NOTE — Progress Notes (Signed)
    Subjective:    Patient ID: Manuel Kline, male    DOB: [birth year], 82 y.o.   MRN: [id number]  CC: Back Pain   HPI: [name] is a 82 y.o. male presenting for Back Pain  Two days ago started having low back pain Hurts getting into his truck, hurts when he takes a deep a breath Has also been working more in his garden recently Does have h/o UTis, wants to make sure not UTI No dysuria, no urinary urgency or frequency that is different from baseline Low back pain is not there all the time, only when moving  Needs refills on insulin. Not yet due for HgA1c.   Depression screen [facility] 2/9 11/01/2015 08/29/2015 07/26/2015 03/01/2015 10/16/2014  Decreased Interest 0 0 0 0 0  Down, Depressed, Hopeless 0 0 0 0 0  PHQ - 2 Score 0 0 0 0 0     Relevant past medical, surgical, family and social history reviewed and updated as indicated. Interim medical history since our last visit reviewed. Allergies and medications reviewed and updated.    ROS: Per HPI unless specifically indicated above  History  Smoking status  . Never Smoker   Smokeless tobacco  . Never Used    Past Medical History Patient Active Problem List   Diagnosis Date Noted  . Diabetic peripheral neuropathy ([name]) 01/31/2015  . CKD (chronic kidney disease) stage 3, GFR 30-59 ml/min 11/05/2014  . OSA on CPAP 11/05/2014  . Severe obesity (BMI >= 40) ([name]) 11/05/2014  . Sleep apnea in adult   . CAD (coronary artery disease)   . Hypertensive heart disease without CHF   . Dual implantable cardioverter-defibrillator in situ   . Sleep apnea   . Hyperlipidemia   . Type 2 diabetes, uncontrolled, with neuropathy ([name])   . Morbid obesity ([name])   . Ischemic cardiomyopathy   . Chronic systolic heart failure ([name])   . S/P CABG (coronary artery bypass graft) 11/02/2000       Objective:    BP 144/68 mmHg  Pulse 58  Temp(Src) 96.9 F (36.1 C) (Oral)  Ht 5\' 6"  (1.676 m)  Wt 230 lb 9.6 oz (104.599 kg)  BMI 37.24 kg/m2  Wt  Readings from Last 3 Encounters:  11/01/15 230 lb 9.6 oz (104.599 kg)  08/29/15 230 lb (104.327 kg)  07/26/15 230 lb 6.4 oz (104.509 kg)     Gen: NAD, alert, cooperative with exam, NCAT EYES: EOMI, no scleral injection or icterus ENT: OP without erythema LYMPH: no cervical LAD CV: NRRR, normal S1/S2, no murmur, distal pulses 2+ b/l Resp: CTABL, no wheezes, normal WOB Abd: +BS, soft, NTND. no guarding or organomegaly, no CVA tenderness Ext: No edema, warm Neuro: Alert and oriented, strength equal b/l UE and LE, coordination grossly normal MSK: no point tenderness over back     Assessment & Plan:    Riggins was seen today for back pain, discussed gentle back exercises, taking it easy, recently increased work in garden. Urine with no signs of infection.  Diagnoses and all orders for this visit:  Bilateral low back pain without sciatica  UTI symptoms -     POCT urinalysis dipstick -     POCT UA - Microscopic Only  DM2: refills sent in for lantus.    Follow up plan: Return in about 4 weeks (around 11/29/2015) for DM2.  Assunta Found, MD [facility] 11/01/2015, 11:37 AM

## 2015-11-05 ENCOUNTER — Encounter: Payer: Self-pay | Admitting: Nurse Practitioner

## 2015-11-05 ENCOUNTER — Ambulatory Visit (INDEPENDENT_AMBULATORY_CARE_PROVIDER_SITE_OTHER): Payer: Medicare Other | Admitting: Nurse Practitioner

## 2015-11-05 VITALS — BP 140/62 | HR 66 | Resp 18 | Ht 66.0 in | Wt 234.0 lb

## 2015-11-05 DIAGNOSIS — Z9989 Dependence on other enabling machines and devices: Principal | ICD-10-CM

## 2015-11-05 DIAGNOSIS — G4733 Obstructive sleep apnea (adult) (pediatric): Secondary | ICD-10-CM | POA: Diagnosis not present

## 2015-11-05 NOTE — Patient Instructions (Signed)
Continue CPAP at current settings compliance good Follow-up yearly and when necessary

## 2015-11-05 NOTE — Progress Notes (Signed)
[facility]  PATIENT: Manuel Kline DOB: 1931-09-22   REASON FOR VISIT: follow-up obstructive sleep apnea on CPAP HISTORY FROM:patient    HISTORY OF PRESENT ILLNESS: HISTORYCD: Manuel Kline was last seen 7-1/2 years ago in my practice, at that time a patient of Dr Edrick Oh.  At the time he carried the diagnosis of a complicated or so-called complex sleep apnea syndrome. The patient was placed on a VPAP because of his cardiac history including cardiomyopathy, coronary artery disease, congestive heart failure and he has a defibrillator implanted. His residual apnea index was 7.5 his sleep study in 2007 diagnosed him with an AHI of 77 apneas per hour of sleep. The patient had meanwhile felt that the machine did not longer benefit him or that it actually multifunctioned. He has not been using it since 2012, but never contacted [place] or the DME. He is here today to see if he actually still needs apnea treatment and if his condition is still present.  He is here today to find out what the best treatment would be should be still find apnea to be present. His wife is here today with him, stating that the patient kicks a lot at night that he seems not to be waking from it. The patient watches every night TV late, and goes to bed at about 11.30- will promptly fall asleep. He snores thunderously, he will sleep supine due to a shoulder injury. His wife has noted short pauses in breathing , followed by a leg kick.  He has one bathroom break at 4 AM. He rises at 7.30 , is woken by his wife. He feels refreshed. He has slept several night in a recliner when he has coughing and "Chest congestion".  He had a tonsillectomy in childhood. No facial or neck surgeries. The patient endorsed today a high degree of fatigue, SOB with minimal exertion, Fatigue degree 54 points, Epworth 8 , GDS zero points.  CD3-7-16; interval history : Manuel Kline is seen here today for his interval history. He had  undergone a repeat sleep study which confirmed obstructive sleep apnea still to be present. The patient has meanwhile learned that he has chronic kidney disease grade 3 and his medications have been changed to eliminate those that burden the kidney. His sleep study took place on 08-09-14. The patient had an AHI of 54.8 oxygen nadir was 81% with 67.6 minutes of desaturation. The patient was split to a Pap pressure of 10 cm water under which his AHI became 0.0. His machine has been reset to this new pressure. I would like to add that his apnea was positional dependent and that he was placed on an Eason nasal mask by [employer] in medium size, which he likes better. Manuel Kline has again 100% compliance for the last 30 days of CPAP use dated 11-01-14 he used to machine 29 out of 30 days over 4 hours at night making it at 97% compliance for time of use. On average he uses the machine at night 7 hours and 27 minutes. The set pressure is 10 cm water with 3 cm EPR his residual AHI is 0.5. Today's Epworth sleepiness score was endorsed at 8 points and his fatigue severity score is 12 points.  From a sleep standpoint, the patient is doing excellent.  The patient is followed by advanced home [facility] DME , No; [phone].  11/05/15. Manuel Kline, 82 year old returns for follow-up. He has obstructive sleep apnea on CPAP. His compliance report  today is greater than 4 hours at 87%. Patient claims he was sick with a respiratory infection and could not wear his CPAP for those other days. Average usage 6 hours 5 minutes. Set pressure is 10 cm of water EPR level is 3. AHI 0.3. Epworth score today is 7. Fatigue severity score is 11. He returns for reevaluation   REVIEW OF SYSTEMS: Full 14 system review of systems performed and notable only for those listed, all others are neg:  Constitutional: neg  Cardiovascular: neg Ear/Nose/Throat: neg  Skin: neg Eyes: neg Respiratory: neg Gastroitestinal: neg    Hematology/Lymphatic: neg  Endocrine: neg Musculoskeletal:neg Allergy/Immunology: neg Neurological: neg Psychiatric: neg Sleep : obstructive sleep apnea on CPAP   ALLERGIES: No Known Allergies  HOME MEDICATIONS: Outpatient Prescriptions Prior to Visit  Medication Sig Dispense Refill  . allopurinol (ZYLOPRIM) 300 MG tablet TAKE ONE-HALF TABLET BY MOUTH ONCE DAILY 45 tablet 1  . aspirin 81 MG tablet Take 81 mg by mouth daily.     Marland Kitchen atorvastatin (LIPITOR) 80 MG tablet Take 40 mg by mouth daily.    . fluticasone (FLONASE) 50 MCG/ACT nasal spray Place 1 spray into both nostrils 2 (two) times daily as needed for allergies or rhinitis. 16 g 6  . furosemide (LASIX) 40 MG tablet Take 1 tablet by mouth 2 (two) times daily.    Marland Kitchen gabapentin (NEURONTIN) 300 MG capsule TAKE ONE CAPSULE BY MOUTH TWICE DAILY 60 capsule 2  . glucose blood (ACCU-CHEK AVIVA) test strip 1 each by Other route 4 (four) times daily. DX: E11.40 100 each 5  . insulin detemir (LEVEMIR) 100 UNIT/ML injection Inject 0.5 mLs (50 Units total) into the skin 2 (two) times daily. 30 mL 11  . insulin lispro (HUMALOG) 100 UNIT/ML KiwkPen Inject 0.3 mLs (30 Units total) into the skin 3 (three) times daily. 90 mL 11  . metoprolol succinate (TOPROL-XL) 50 MG 24 hr tablet Take 50 mg by mouth daily.    . Multiple Vitamin (MULTIVITAMIN) tablet Take 1 tablet by mouth daily. Eye formula    . Omega-3 Fatty Acids (FISH OIL) 1000 MG CPDR Take 1 tablet by mouth daily.    Marland Kitchen omeprazole (PRILOSEC) 20 MG capsule Take 1 capsule (20 mg total) by mouth 2 (two) times daily before a meal. TAKE ONE CAPSULE BY MOUTH TWICE DAILy 60 capsule 4  . ondansetron (ZOFRAN-ODT) 4 MG disintegrating tablet Take 1 tablet (4 mg total) by mouth every 12 (twelve) hours. 3 tablet 0  . quinapril (ACCUPRIL) 40 MG tablet Take 20 mg by mouth at bedtime.    . Insulin Detemir (LEVEMIR FLEXTOUCH) 100 UNIT/ML Pen Inject 50 Units into the skin 2 (two) times daily. 90 mL 2  . insulin  lispro (HUMALOG) 100 UNIT/ML injection Inject 0.3 mLs (30 Units total) into the skin 3 (three) times daily with meals. 30 mL 11   No facility-administered medications prior to visit.    PAST MEDICAL HISTORY: Past Medical History  Diagnosis Date  . CAD (coronary artery disease)   . S/P CABG (coronary artery bypass graft) 11/02/2000  . Hypertensive heart disease without CHF 07/31/2011  . ICD dual chamber in situ   . Sleep apnea   . Morbid obesity ([place])   . Erosive esophagitis   . Cardiomyopathy   . Erectile dysfunction   . Metabolic syndrome   . Osteoarthritis   . Calcium oxalate renal stones   . Adenomatous colon polyp 2006  . Hemorrhoids   . Diabetes ([place])   .  HTN (hypertension)   . HLD (hyperlipidemia)   . ICD (implantable cardiac defibrillator) in place     PAST SURGICAL HISTORY: Past Surgical History  Procedure Laterality Date  . Doppler echocardiography  2003  . Back surgery      X'3  . Carpal tunnel release      X2, bilateral  . Abdominal exploration surgery    . Laparotomy    . Cardiac bypass    . Cardiac defibrillator placement    . Esophagogastroduodenoscopy  06/20/2012    Procedure: ESOPHAGOGASTRODUODENOSCOPY (EGD);  Surgeon: Sable Feil, MD;  Location: [facility];  Service: Endoscopy;  Laterality: N/A;  . Colonoscopy  06/20/2012    Procedure: COLONOSCOPY;  Surgeon: Sable Feil, MD;  Location: [facility];  Service: Endoscopy;  Laterality: N/A;  . Rotator cuff surgery      left    FAMILY HISTORY: Family History  Problem Relation Age of Onset  . Diabetes Brother   . Heart disease Father   . Heart disease Mother   . Throat cancer Paternal Uncle   . Diabetes Brother   . Diabetes Brother     SOCIAL HISTORY: Social History   Social History  . Marital Status: Married    Spouse Name: N/A  . Number of Children: 4  . Years of Education: N/A   Occupational History  . Retired from [employer]  .  Smoking status: Never Smoker   . Smokeless tobacco: Never Used  . Alcohol Use: No  . Drug Use: No  . Sexual Activity: Yes   Other Topics Concern  . Not on file   Social History Narrative   Right handed, Married, 4 kids from previous marriage.  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 Caffeine 1 cup daily.     PHYSICAL EXAM  Filed Vitals:   11/05/15 1357  BP: 140/62  Pulse: 66  Resp: 18  Height: 5\' 6"  (1.676 m)  Weight: 234 lb (106.142 kg)   Body mass index is 37.79 kg/(m^2). General: The patient is awake, alert and appears not in acute distress. The patient is well groomed. Head: Normocephalic, atraumatic.  Neck is supple. Mallampati 2- neck circumference: 18.5 inches .   Cardiovascular: Regular rate and rhythm , without murmurs Respiratory: Lungs are clear to auscultation. Skin: Without evidence of edema, or rash Neurologic exam : The patient is awake and alert, oriented to place and time. Memory subjective described as intact. There is a normal attention span & concentration ability. Speech is fluent without dysarthria, dysphonia or aphasia. Mood and affect are appropriate.ESS 7. FSS 11 Cranial nerves: Pupils are reactive to light. Visual fields by finger perimetry are intact. Hearing loss.  Facial sensation intact to fine touch. Facial motor strength is symmetric and tongue and uvula move midline. Motor exam: Normal tone, muscle bulk and symmetric strength in all extremities. Coordination: Rapid alternating movements in the fingers/hands is slowed . Finger-to-nose maneuver normal without evidence of ataxia, dysmetria or tremor. Gait and station: Patient walks without assistive device and is able unassisted to climb up to the exam table. Strength within normal limits. Stance is stable and normal.  Deep tendon reflexes: in the upper and lower extremities are attenuated . Babinski maneuver response is equivocal  DIAGNOSTIC DATA (LABS, IMAGING, TESTING) - I reviewed patient records,  labs, notes, testing and imaging myself where available.  Lab Results  Component Value Date   WBC 10.7 08/29/2015   HGB 12.7* 03/19/2015   HCT  40.7 08/29/2015   MCV 87 08/29/2015   PLT 183 08/29/2015      Component Value Date/Time   NA 137 08/29/2015 0841   NA 140 08/26/2009 0918   K 5.4* 08/29/2015 0841   CL 94* 08/29/2015 0841   CO2 21 08/29/2015 0841   GLUCOSE 315* 08/29/2015 0841   GLUCOSE 97 08/26/2009 0918   BUN 39* 08/29/2015 0841   BUN 25* 08/26/2009 0918   CREATININE 1.90* 08/29/2015 0841   CALCIUM 8.5* 08/29/2015 0841   PROT 7.0 08/29/2015 0841   ALBUMIN 4.5 08/29/2015 0841   AST 17 08/29/2015 0841   ALT 21 08/29/2015 0841   ALKPHOS 128* 08/29/2015 0841   BILITOT 0.5 08/29/2015 0841   BILITOT 0.8 06/12/2014 1044   GFRNONAA 32* 08/29/2015 0841   GFRAA 37* 08/29/2015 0841    Lab Results  Component Value Date   HGBA1C 8.5 08/29/2015   Lab Results  Component Value Date   VITAMINB12 >2000* 03/19/2015   ASSESSMENT AND PLAN  82 y.o. year old male  has a past medical history of  Sleep apnea; Morbid obesity ([place]); here to follow up. compliance report today is greater than 4 hours 87%. Patient claims he was sick with a respiratory infection and could not wear his CPAP for those other days. Average usage 6 hours 5 minutes. Set pressure is 10 cm of water EPR level is 3. AHI 0.3 The patient is a current patient of Dr. Brett Fairy who is out of the office today . This note is sent to the work in doctor.     Continue CPAP at current settings compliance is good Follow-up yearly and when necessary Dennie Bible, [facility], [facility], APRN  [facility] [address], [place], [place] [address] [phone]

## 2015-11-08 ENCOUNTER — Other Ambulatory Visit: Payer: Self-pay | Admitting: Family Medicine

## 2015-11-09 NOTE — Telephone Encounter (Signed)
Sent in insulin refill per pt request, to fill vials or pens whichever is cheaper.

## 2015-11-16 DIAGNOSIS — G4733 Obstructive sleep apnea (adult) (pediatric): Secondary | ICD-10-CM | POA: Diagnosis not present

## 2015-11-19 NOTE — Progress Notes (Signed)
I reviewed note and agree with plan.   Amberlie Gaillard R. Ilene Witcher, MD  Certified in Neurology, Neurophysiology and Neuroimaging  [facility] [address], [address] [place], [place] [address] [phone]   

## 2015-11-29 ENCOUNTER — Encounter: Payer: Self-pay | Admitting: Pediatrics

## 2015-11-29 ENCOUNTER — Ambulatory Visit (INDEPENDENT_AMBULATORY_CARE_PROVIDER_SITE_OTHER): Payer: Medicare Other | Admitting: Pediatrics

## 2015-11-29 VITALS — BP 140/71 | HR 60 | Temp 97.0°F | Ht 66.0 in | Wt 229.2 lb

## 2015-11-29 DIAGNOSIS — E1142 Type 2 diabetes mellitus with diabetic polyneuropathy: Secondary | ICD-10-CM

## 2015-11-29 DIAGNOSIS — N183 Chronic kidney disease, stage 3 unspecified: Secondary | ICD-10-CM

## 2015-11-29 DIAGNOSIS — I2510 Atherosclerotic heart disease of native coronary artery without angina pectoris: Secondary | ICD-10-CM

## 2015-11-29 DIAGNOSIS — IMO0002 Reserved for concepts with insufficient information to code with codable children: Secondary | ICD-10-CM

## 2015-11-29 DIAGNOSIS — I10 Essential (primary) hypertension: Secondary | ICD-10-CM | POA: Diagnosis not present

## 2015-11-29 DIAGNOSIS — E1165 Type 2 diabetes mellitus with hyperglycemia: Secondary | ICD-10-CM | POA: Diagnosis not present

## 2015-11-29 DIAGNOSIS — I5022 Chronic systolic (congestive) heart failure: Secondary | ICD-10-CM | POA: Diagnosis not present

## 2015-11-29 DIAGNOSIS — E785 Hyperlipidemia, unspecified: Secondary | ICD-10-CM | POA: Diagnosis not present

## 2015-11-29 DIAGNOSIS — E1140 Type 2 diabetes mellitus with diabetic neuropathy, unspecified: Secondary | ICD-10-CM

## 2015-11-29 DIAGNOSIS — Z9989 Dependence on other enabling machines and devices: Secondary | ICD-10-CM

## 2015-11-29 DIAGNOSIS — G4733 Obstructive sleep apnea (adult) (pediatric): Secondary | ICD-10-CM | POA: Diagnosis not present

## 2015-11-29 LAB — BAYER DCA HB A1C WAIVED: HB A1C: 8 % — AB (ref <7.0)

## 2015-11-29 NOTE — Progress Notes (Signed)
    Subjective:    Patient ID: Manuel Kline, male    DOB: Oct 27, 1931, 82 y.o.   MRN: [id number]  CC: Follow-up   HPI: Manuel Kline is a 82 y.o. male presenting for Follow-up  accuchek nano is current glucometer Foot exam today Ran out of long acting insulin 1 week ago Usually takes 50u BID Still taking 30u humalog TID In donut hole now Otherwise feeling well Numbness in his feet has been ongoing When sitting on bucket his feet fell asleep, didn't realize it until he stood up, was unbalanced for a minute, didn't fall   Depression screen [facility] 2/9 11/29/2015 11/01/2015 08/29/2015 07/26/2015 03/01/2015  Decreased Interest 0 0 0 0 0  Down, Depressed, Hopeless 0 0 0 0 0  PHQ - 2 Score 0 0 0 0 0     Relevant past medical, surgical, family and social history reviewed and updated as indicated. Interim medical history since our last visit reviewed. Allergies and medications reviewed and updated.    ROS: Per HPI unless specifically indicated above  History  Smoking status  . Never Smoker   Smokeless tobacco  . Never Used        Objective:    BP 140/71 mmHg  Pulse 60  Temp(Src) 97 F (36.1 C) (Oral)  Ht 5\' 6"  (1.676 m)  Wt 229 lb 3.2 oz (103.964 kg)  BMI 37.01 kg/m2  Wt Readings from Last 3 Encounters:  11/29/15 229 lb 3.2 oz (103.964 kg)  11/05/15 234 lb (106.142 kg)  11/01/15 230 lb 9.6 oz (104.599 kg)      Gen: NAD, alert, cooperative with exam, NCAT EYES: EOMI, no scleral injection or icterus ENT:  OP without erythema LYMPH: no cervical LAD CV: NRRR, normal S1/S2, no murmur Resp: CTABL, no wheezes, normal WOB Abd: +BS, soft, NTND. no guarding or organomegaly Ext: No edema, warm, 2+ DP pulses. Sensation to touch decreased b/l feet and inner lower leg to knee b/l. Not able to feel monofilament in same distribution. Neuro: Alert and oriented Skin: base of R foot with hardened callus R ball of foot, no redness or erythema     Assessment & Plan:    Manuel Kline was seen  today for follow-up multiple med problems.  Diagnoses and all orders for this visit:  Chronic systolic heart failure (HCC) Stable, euvolemic today.   HTN Stable, cont current meds  Coronary artery disease Stable, no chest pain Due to return to cardiologist  Type 2 diabetes, uncontrolled, with neuropathy ([place]) Foot exam today, numbness b/l feet. Stressed importance of looking at feet every night. Recently noticed something stuck in foot, UTD on tetanus, no surrounding erythema, area appears to be well healing. If any worsening needs to be seen. Will f/u with Manuel Kline next week Start tresiba today, take 80 units at night, Was on levemir 50u BID--stop this Cont humalog 30u TID with meals Keep checking AM BGLs  Diabetic peripheral neuropathy (HCC) Cont gabapentin -     Bayer DCA Hb A1c Waived  CKD (chronic kidney disease) stage 3, GFR 30-59 ml/min stable  OSA on CPAP stable   Follow up plan: Return for next week to see Manuel Kline for paperwork.  Manuel Found, MD [facility] 11/29/2015, 11:37 AM

## 2015-11-30 DIAGNOSIS — I10 Essential (primary) hypertension: Secondary | ICD-10-CM

## 2015-11-30 HISTORY — DX: Essential (primary) hypertension: I10

## 2015-11-30 MED ORDER — GLUCOSE BLOOD VI STRP: 1.0000 | ORAL_STRIP | Freq: Four times a day (QID) | Status: DC

## 2015-11-30 MED ORDER — INSULIN DEGLUDEC 200 UNIT/ML ~~LOC~~ SOPN: 80.0000 [IU] | PEN_INJECTOR | Freq: Every day | SUBCUTANEOUS | Status: DC

## 2015-12-06 ENCOUNTER — Encounter: Payer: Self-pay | Admitting: Pharmacist

## 2015-12-06 ENCOUNTER — Ambulatory Visit (INDEPENDENT_AMBULATORY_CARE_PROVIDER_SITE_OTHER): Payer: Medicare Other | Admitting: Pharmacist

## 2015-12-06 VITALS — BP 139/70 | HR 60 | Ht 66.0 in | Wt 228.5 lb

## 2015-12-06 DIAGNOSIS — E1122 Type 2 diabetes mellitus with diabetic chronic kidney disease: Secondary | ICD-10-CM

## 2015-12-06 DIAGNOSIS — N183 Chronic kidney disease, stage 3 (moderate): Secondary | ICD-10-CM

## 2015-12-06 DIAGNOSIS — Z794 Long term (current) use of insulin: Secondary | ICD-10-CM

## 2015-12-06 NOTE — Patient Instructions (Signed)
Bring back letter that states that you are in coverage gap / donut hole.   We are filling out paperwork for patient assistance program for insulin.   Diabetes and Standards of Medical Care   Diabetes is complicated. You may find that your diabetes team includes a dietitian, nurse, diabetes educator, eye doctor, and more. To help everyone know what is going on and to help you get the care you deserve, the following schedule of care was developed to help keep you on track. Below are the tests, exams, vaccines, medicines, education, and plans you will need.  Blood Glucose Goals Prior to meals = 80 - 130 Within 2 hours of the start of a meal = less than 180  HbA1c test (goal is less than 7.0% - your last value was 8.0%) This test shows how well you have controlled your glucose over the past 2 to 3 months. It is used to see if your diabetes management plan needs to be adjusted.   It is performed at least 2 times a year if you are meeting treatment goals.  It is performed 4 times a year if therapy has changed or if you are not meeting treatment goals.  Blood pressure test  This test is performed at every routine medical visit. The goal is less than 140/90 mmHg for most people, but 130/80 mmHg in some cases. Ask your health care provider about your goal.  Dental exam  Follow up with the dentist regularly.  Eye exam  If you are diagnosed with type 1 diabetes as a child, get an exam upon reaching the age of 42 years or older and have had diabetes for 3 to 5 years. Yearly eye exams are recommended after that initial eye exam.  If you are diagnosed with type 1 diabetes as an adult, get an exam within 5 years of diagnosis and then yearly.  If you are diagnosed with type 2 diabetes, get an exam as soon as possible after the diagnosis and then yearly.  Foot care exam  Visual foot exams are performed at every routine medical visit. The exams check for cuts, injuries, or other problems with the  feet.  A comprehensive foot exam should be done yearly. This includes visual inspection as well as assessing foot pulses and testing for loss of sensation.  Check your feet nightly for cuts, injuries, or other problems with your feet. Tell your health care provider if anything is not healing.  Kidney function test (urine microalbumin)  This test is performed once a year.  Type 1 diabetes: The first test is performed 5 years after diagnosis.  Type 2 diabetes: The first test is performed at the time of diagnosis.  A serum creatinine and estimated glomerular filtration rate (eGFR) test is done once a year to assess the level of chronic kidney disease (CKD), if present.  Lipid profile (cholesterol, HDL, LDL, triglycerides)  Performed every 5 years for most people.  The goal for LDL is less than 100 mg/dL. If you are at high risk, the goal is less than 70 mg/dL.  The goal for HDL is 40 mg/dL to 50 mg/dL for men and 50 mg/dL to 60 mg/dL for women. An HDL cholesterol of 60 mg/dL or higher gives some protection against heart disease.  The goal for triglycerides is less than 150 mg/dL.  Influenza vaccine, pneumococcal vaccine, and hepatitis B vaccine  The influenza vaccine is recommended yearly.  The pneumococcal vaccine is generally given once in a lifetime.  However, there are some instances when another vaccination is recommended. Check with your health care provider.  The hepatitis B vaccine is also recommended for adults with diabetes.  Diabetes self-management education  Education is recommended at diagnosis and ongoing as needed.  Treatment plan  Your treatment plan is reviewed at every medical visit.  Document Released: 06/14/2009 Document Revised: 04/19/2013 Document Reviewed: 01/17/2013 [facility] Patient Information 2014 [place].

## 2015-12-06 NOTE — Progress Notes (Signed)
Subjective:      Manuel Kline is a 82 y.o. male who presents for  reevaluation of Type 2 diabetes mellitus.  The initial diagnosis of diabetes was made over a year ago.    His clinical course has fluctuated. Insulin dosage review with [facility] suggested compliance most of the time.  He is in Medicare coverage gap so there are times he doesn't have insulin.   Associated symptoms of hyperglycemia have been fatigue.  Associated symptoms of hypoglycemia have been none.   He is currently taking Novolog 30 units prior to each meal units and Tresiba 80 units.  Patient has reached Medicare coverage gap and is not able to afford insulin.  He recently picked up 1 vial of Lantus (= 59ml) and 1 vial of Humalog and has to pay over $200.  Patient is [facility] resident so does not qualify for assistance with [facility] patient Assistance program.  He does report that he has filled out application for LIS but has not heard if he will qualify yet.  Compliance with blood glucose monitoring: good.  The patient does perform independently. Rotation of sites for injection: abdominal wall Exercise: intermittently  Blood glucose reading from home:  238, 172, 107, 102, 91, 253, 71, 107, 117, 148, 184, 127          The following portions of the patient's history were reviewed and updated as appropriate: allergies, current medications, past family history, past medical history, past social history, past surgical history and problem list.   Objective:    BP 139/70 mmHg  Pulse 60  Ht 5\' 6"  (1.676 m)  Wt 228 lb 8 oz (103.647 kg)  BMI 36.90 kg/m2  A1c = 8.0% (11/29/2015)    Assessment:    Diabetes Mellitus type I, under inadequate control but improving   Diabetic neuropathy Stage 3 chronic kidney disease Medication Problem - unable to afford insulin because in Medicare Coverage Gap Obesity  Plan:    1.  RX changes:   Patient was given Tresiba 100u/ml in office today #2 samples.  We are looking into  if patient would qualify for patient assistance from [place] - paperwork filledout   Continue Novolog / Humalog to 30 units prior to each meal 2.  Education:  interpretation of lab results, blood sugar goals, complications of diabetes mellitus, hypoglycemia prevention and treatment, exercise, self-monitoring of blood glucose skills, carbohydrate counting, use of insulin pen, insulin adjustments and use of sliding scale/correction formula 3.  Compliance at present is estimated to be good. Efforts to improve compliance (if necessary) will be directed at dietary modifications, increased exercise and regular blood sugar monitoring. 4. Follow up: 3 months  Total time spent with patient = 30 minutes  Cherre Robins, PharmD, CPP

## 2015-12-11 DIAGNOSIS — Z95810 Presence of automatic (implantable) cardiac defibrillator: Secondary | ICD-10-CM | POA: Diagnosis not present

## 2015-12-17 ENCOUNTER — Ambulatory Visit (INDEPENDENT_AMBULATORY_CARE_PROVIDER_SITE_OTHER): Payer: Medicare Other | Admitting: Internal Medicine

## 2015-12-17 ENCOUNTER — Encounter: Payer: Self-pay | Admitting: Internal Medicine

## 2015-12-17 VITALS — BP 128/72 | HR 60 | Ht 66.0 in | Wt 233.4 lb

## 2015-12-17 DIAGNOSIS — I255 Ischemic cardiomyopathy: Secondary | ICD-10-CM

## 2015-12-17 DIAGNOSIS — G4733 Obstructive sleep apnea (adult) (pediatric): Secondary | ICD-10-CM | POA: Diagnosis not present

## 2015-12-17 DIAGNOSIS — I5022 Chronic systolic (congestive) heart failure: Secondary | ICD-10-CM | POA: Diagnosis not present

## 2015-12-17 NOTE — Progress Notes (Signed)
HPI [name] returns today for followup. He is a very pleasant 82 year old man with an ischemic cardiomyopathy, chronic systolic heart failure, diabetes, morbid obesity, hypertension, status post ICD implantation. In the interim, despite his multiple medical problems, he has remained stable. The patient denies chest pain, shortness of breath, or syncope.  No ICD shocks. He remains active in his garden. No Known Allergies   Current Outpatient Prescriptions  Medication Sig Dispense Refill  . allopurinol (ZYLOPRIM) 300 MG tablet TAKE ONE-HALF TABLET BY MOUTH ONCE DAILY 45 tablet 1  . aspirin 81 MG tablet Take 81 mg by mouth daily.     Marland Kitchen atorvastatin (LIPITOR) 80 MG tablet Take 40 mg by mouth daily.    . fluticasone (FLONASE) 50 MCG/ACT nasal spray Place 1 spray into both nostrils 2 (two) times daily as needed for allergies or rhinitis. 16 g 6  . furosemide (LASIX) 40 MG tablet Take 1 tablet by mouth 2 (two) times daily.    Marland Kitchen gabapentin (NEURONTIN) 300 MG capsule TAKE ONE CAPSULE BY MOUTH TWICE DAILY 60 capsule 2  . glucose blood (ACCU-CHEK AVIVA) test strip 1 each by Other route 4 (four) times daily. DX: E11.40 100 each 5  . Insulin Degludec (TRESIBA FLEXTOUCH) 200 UNIT/ML SOPN Inject 80 Units into the skin at bedtime. 3 pen 0  . insulin lispro (HUMALOG) 100 UNIT/ML KiwkPen Inject 0.3 mLs (30 Units total) into the skin 3 (three) times daily. 90 mL 11  . metoprolol succinate (TOPROL-XL) 50 MG 24 hr tablet Take 50 mg by mouth daily.    . Multiple Vitamin (MULTIVITAMIN) tablet Take 1 tablet by mouth daily. Eye formula    . Omega-3 Fatty Acids (FISH OIL) 1000 MG CPDR Take 1 tablet by mouth daily.    Marland Kitchen omeprazole (PRILOSEC) 20 MG capsule TAKE ONE CAPSULE BY MOUTH TWICE DAILY BEFORE  A  MEAL 60 capsule 5  . ondansetron (ZOFRAN-ODT) 4 MG disintegrating tablet Take 1 tablet (4 mg total) by mouth every 12 (twelve) hours. 3 tablet 0  . quinapril (ACCUPRIL) 40 MG tablet Take 20 mg by mouth at bedtime.      No current facility-administered medications for this visit.     Past Medical History  Diagnosis Date  . CAD (coronary artery disease)   . S/P CABG (coronary artery bypass graft) 11/02/2000  . Hypertensive heart disease without CHF 07/31/2011  . ICD dual chamber in situ   . Sleep apnea   . Morbid obesity ([place])   . Erosive esophagitis   . Cardiomyopathy   . Erectile dysfunction   . Metabolic syndrome   . Osteoarthritis   . Calcium oxalate renal stones   . Adenomatous colon polyp 2006  . Hemorrhoids   . Diabetes ([place])   . HTN (hypertension)   . HLD (hyperlipidemia)   . ICD (implantable cardiac defibrillator) in place     ROS:   All systems reviewed and negative except as noted in the HPI.   Past Surgical History  Procedure Laterality Date  . Doppler echocardiography  2003  . Back surgery      X'3  . Carpal tunnel release      X2, bilateral  . Abdominal exploration surgery    . Laparotomy    . Cardiac bypass    . Cardiac defibrillator placement    . Esophagogastroduodenoscopy  06/20/2012    Procedure: ESOPHAGOGASTRODUODENOSCOPY (EGD);  Surgeon: Sable Feil, MD;  Location: [facility];  Service: Endoscopy;  Laterality: N/A;  . Colonoscopy  06/20/2012    Procedure: COLONOSCOPY;  Surgeon: Sable Feil, MD;  Location: [facility];  Service: Endoscopy;  Laterality: N/A;  . Rotator cuff surgery      left     Family History  Problem Relation Age of Onset  . Diabetes Brother   . Heart disease Father   . Heart disease Mother   . Throat cancer Paternal Uncle   . Diabetes Brother   . Diabetes Brother      Social History   Social History  . Marital Status: Married    Spouse Name: N/A  . Number of Children: 4  . Years of Education: N/A   Occupational History  . Retired from [employer]  . Smoking status: Never Smoker   . Smokeless tobacco: Never Used  . Alcohol Use: No  . Drug Use: No  . Sexual Activity:  Yes   Other Topics Concern  . Not on file   Social History Narrative   Right handed, Married, 4 kids from previous marriage.  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 Caffeine 1 cup daily.     BP 128/72 mmHg  Pulse 60  Ht 5\' 6"  (1.676 m)  Wt 233 lb 6.4 oz (105.87 kg)  BMI 37.69 kg/m2  Physical Exam:  obese appearing 82 year old man, NAD HEENT: Unremarkable Neck:  7 cm  JVD, no thyromegally Lymphatics:  No adenopathy Back:  No CVA tenderness Lungs:  Clear with no wheezes HEART:  Regular rate rhythm, no murmurs, no rubs, no clicks Abd:  soft,  obese, positive bowel sounds, no organomegally, no rebound, no guarding Ext:  2 plus pulses, no edema, no cyanosis, no clubbing Skin:  No rashes no nodules Neuro:  CN II through XII intact, motor grossly intact  DEVICE  Normal device function.  See PaceArt for details.   Assess/Plan:  1. VT - he has had no ventricular arrhythmias. Will continue his current meds. 2. Chronic systolic heart failure - his symptoms are class 2. Will follow. He is encouraged to reduce his sodium intake. 3. ICD - His medtronic DDD ICD is working normally. Will recheck in several months.  [name]

## 2015-12-17 NOTE — Patient Instructions (Addendum)

## 2015-12-18 ENCOUNTER — Other Ambulatory Visit: Payer: Self-pay | Admitting: Pharmacist

## 2015-12-18 ENCOUNTER — Encounter (INDEPENDENT_AMBULATORY_CARE_PROVIDER_SITE_OTHER): Payer: Self-pay

## 2015-12-18 MED ORDER — INSULIN ASPART 100 UNIT/ML ~~LOC~~ SOLN: 30.0000 [IU] | Freq: Three times a day (TID) | SUBCUTANEOUS | Status: DC

## 2015-12-18 MED ORDER — INSULIN DEGLUDEC 200 UNIT/ML ~~LOC~~ SOPN
80.0000 [IU] | PEN_INJECTOR | Freq: Every day | SUBCUTANEOUS | Status: DC
Start: 2015-12-18 — End: 2016-03-16

## 2015-12-20 ENCOUNTER — Ambulatory Visit (INDEPENDENT_AMBULATORY_CARE_PROVIDER_SITE_OTHER): Payer: Medicare Other | Admitting: Podiatry

## 2015-12-20 ENCOUNTER — Encounter: Payer: Self-pay | Admitting: Podiatry

## 2015-12-20 DIAGNOSIS — M216X9 Other acquired deformities of unspecified foot: Secondary | ICD-10-CM

## 2015-12-20 DIAGNOSIS — M79609 Pain in unspecified limb: Principal | ICD-10-CM

## 2015-12-20 DIAGNOSIS — M2040 Other hammer toe(s) (acquired), unspecified foot: Secondary | ICD-10-CM | POA: Diagnosis not present

## 2015-12-20 DIAGNOSIS — B351 Tinea unguium: Secondary | ICD-10-CM | POA: Diagnosis not present

## 2015-12-20 DIAGNOSIS — E1140 Type 2 diabetes mellitus with diabetic neuropathy, unspecified: Secondary | ICD-10-CM | POA: Diagnosis not present

## 2015-12-20 NOTE — Progress Notes (Signed)
Patient ID: Manuel Kline, male   DOB: September 29, 1931, 82 y.o.   MRN: [id number] Complaint:  Visit Type: Patient returns to my office for continued preventative foot care services. Complaint: Patient states" my nails have grown long and thick and become painful to walk and wear shoes" Patient has been diagnosed with DM with neuropathy. The patient presents for preventative foot care services. No changes to ROS  Podiatric Exam: Vascular: dorsalis pedis and posterior tibial pulses are palpable bilateral. Capillary return is immediate. Temperature gradient is WNL. Skin turgor WNL  Sensorium: Absent Semmes Weinstein monofilament test. Normal tactile sensation bilaterally. Nail Exam: Pt has thick disfigured discolored nails with subungual debris noted bilateral entire nail second  through fifth toenails Ulcer Exam: There is no evidence of ulcer or pre-ulcerative changes or infection. Hammer toe 2 B/L.  Cavus foot type. Orthopedic Exam: Muscle tone and strength are WNL. No limitations in general ROM. No crepitus or effusions noted. Foot type and digits show no abnormalities. Bony prominences are unremarkable. Skin: o Porokeratosis sub 5th metatarsal B/L. Marland Kitchen No infection or ulcers  Diagnosis:  Onychomycosis, , Pain in right toe, pain in left toes  Treatment & Plan Procedures and Treatment: Consent by patient was obtained for treatment procedures. The patient understood the discussion of treatment and procedures well. All questions were answered thoroughly reviewed. Debridement of mycotic and hypertrophic toenails, 2 through 5 bilateral and clearing of subungual debris. No ulceration, no infection noted. Initiate diabetic shoe paperwork for diabetic neuropathy, hammer toe B/L and preulcerous callus sub 5th B/L Return Visit-Office Procedure: Patient instructed to return to the office for a follow up visit 3 months for continued evaluation and treatment.    Gardiner Barefoot DPM

## 2015-12-25 LAB — CUP PACEART INCLINIC DEVICE CHECK
Battery Voltage: 2.68 V
Brady Statistic AP VP Percent: 0 %
Brady Statistic AP VS Percent: 63.48 %
Brady Statistic RA Percent Paced: 63.48 %
Brady Statistic RV Percent Paced: 0.02 %
HIGH POWER IMPEDANCE MEASURED VALUE: 46 Ohm
HighPow Impedance: 64 Ohm
Implantable Lead Implant Date: 20030519
Implantable Lead Location: 753859
Implantable Lead Model: 158
Implantable Lead Serial Number: 115102
Implantable Lead Serial Number: 159999
Lead Channel Pacing Threshold Amplitude: 0.75 V
Lead Channel Pacing Threshold Pulse Width: 0.4 ms
Lead Channel Pacing Threshold Pulse Width: 0.7 ms
Lead Channel Sensing Intrinsic Amplitude: 8.625 mV
Lead Channel Setting Pacing Amplitude: 2.5 V
Lead Channel Setting Pacing Amplitude: 3 V
Lead Channel Setting Sensing Sensitivity: 0.3 mV
MDC IDC LEAD IMPLANT DT: 20030519
MDC IDC LEAD LOCATION: 753860
MDC IDC LEAD MODEL: 4087
MDC IDC MSMT LEADCHNL LV IMPEDANCE VALUE: 4047 Ohm
MDC IDC MSMT LEADCHNL LV IMPEDANCE VALUE: 4047 Ohm
MDC IDC MSMT LEADCHNL LV IMPEDANCE VALUE: 4047 Ohm
MDC IDC MSMT LEADCHNL RA IMPEDANCE VALUE: 475 Ohm
MDC IDC MSMT LEADCHNL RA PACING THRESHOLD AMPLITUDE: 1.5 V
MDC IDC MSMT LEADCHNL RA SENSING INTR AMPL: 1.75 mV
MDC IDC MSMT LEADCHNL RV IMPEDANCE VALUE: 380 Ohm
MDC IDC SESS DTM: 20170418134903
MDC IDC SET LEADCHNL RV PACING PULSEWIDTH: 0.4 ms
MDC IDC STAT BRADY AS VP PERCENT: 0.02 %
MDC IDC STAT BRADY AS VS PERCENT: 36.5 %

## 2016-01-02 ENCOUNTER — Telehealth: Payer: Self-pay | Admitting: Pharmacist

## 2016-01-02 ENCOUNTER — Encounter: Payer: Self-pay | Admitting: *Deleted

## 2016-01-02 NOTE — Telephone Encounter (Signed)
Received denial from [employer] patient assistance program.  Patient must spend $1000 out of pocket for 2017 before he will qualify for their program.  The current report I have for out of pocket expense is through 10/29/15 shows patient has spent $490.92.  Tried to request updated report from [facility] but patient must sign for report and request himself.  Left message for patient about getting UTD report.

## 2016-01-02 NOTE — Telephone Encounter (Signed)
Patient brought in up to date report of total out of pocket expense for 2017.  He has not met $1000 out of pocket needed to qualify for patient assistance program.  He has qualified for LIS and he copays for generic meds have decreased to about $2 per month.   Verified current copay per month for Levemir #2 boxes = $82.25 and copy per month for Humalog #1 box = $45.  Patient aware of copays.  Will reevaluate out of pocket cost in 1 month.

## 2016-01-16 ENCOUNTER — Ambulatory Visit (INDEPENDENT_AMBULATORY_CARE_PROVIDER_SITE_OTHER): Payer: Medicare Other | Admitting: Podiatry

## 2016-01-16 DIAGNOSIS — M216X9 Other acquired deformities of unspecified foot: Secondary | ICD-10-CM

## 2016-01-16 DIAGNOSIS — M2041 Other hammer toe(s) (acquired), right foot: Secondary | ICD-10-CM | POA: Diagnosis not present

## 2016-01-16 DIAGNOSIS — E1140 Type 2 diabetes mellitus with diabetic neuropathy, unspecified: Secondary | ICD-10-CM

## 2016-01-16 DIAGNOSIS — M2042 Other hammer toe(s) (acquired), left foot: Secondary | ICD-10-CM

## 2016-01-16 DIAGNOSIS — M2040 Other hammer toe(s) (acquired), unspecified foot: Secondary | ICD-10-CM

## 2016-01-16 DIAGNOSIS — L84 Corns and callosities: Secondary | ICD-10-CM | POA: Diagnosis not present

## 2016-01-16 DIAGNOSIS — G4733 Obstructive sleep apnea (adult) (pediatric): Secondary | ICD-10-CM | POA: Diagnosis not present

## 2016-01-16 NOTE — Progress Notes (Signed)
Patient ID: Manuel Kline, male   DOB: [birth year], 82 y.o.   MRN: [id number] Patient presents for diabetic shoe pick up, shoes are tried on for good fit.  Patient received 1 Pair Orthofeet [place] black in men's size 9.5 extra wide and 3 pairs custom molded diabetic inserts.  Verbal and written break in and wear instructions given.  Patient will follow up for scheduled routine care.

## 2016-01-16 NOTE — Patient Instructions (Signed)

## 2016-02-11 DIAGNOSIS — N189 Chronic kidney disease, unspecified: Secondary | ICD-10-CM | POA: Diagnosis not present

## 2016-02-16 DIAGNOSIS — G4733 Obstructive sleep apnea (adult) (pediatric): Secondary | ICD-10-CM | POA: Diagnosis not present

## 2016-03-02 DIAGNOSIS — I255 Ischemic cardiomyopathy: Secondary | ICD-10-CM | POA: Diagnosis not present

## 2016-03-02 DIAGNOSIS — I119 Hypertensive heart disease without heart failure: Secondary | ICD-10-CM | POA: Diagnosis not present

## 2016-03-02 DIAGNOSIS — I5022 Chronic systolic (congestive) heart failure: Secondary | ICD-10-CM | POA: Diagnosis not present

## 2016-03-02 DIAGNOSIS — E785 Hyperlipidemia, unspecified: Secondary | ICD-10-CM | POA: Diagnosis not present

## 2016-03-02 DIAGNOSIS — I2510 Atherosclerotic heart disease of native coronary artery without angina pectoris: Secondary | ICD-10-CM | POA: Diagnosis not present

## 2016-03-02 LAB — LIPID PANEL
Cholesterol: 125 mg/dL (ref 0–200)
HDL: 29 mg/dL — AB (ref 35–70)
LDL Cholesterol: 50 mg/dL
Triglycerides: 229 mg/dL — AB (ref 40–160)

## 2016-03-05 ENCOUNTER — Ambulatory Visit: Payer: Self-pay | Admitting: Pharmacist

## 2016-03-06 ENCOUNTER — Ambulatory Visit: Payer: Self-pay | Admitting: Pharmacist

## 2016-03-11 DIAGNOSIS — Z95810 Presence of automatic (implantable) cardiac defibrillator: Secondary | ICD-10-CM | POA: Diagnosis not present

## 2016-03-16 ENCOUNTER — Ambulatory Visit (INDEPENDENT_AMBULATORY_CARE_PROVIDER_SITE_OTHER): Payer: Medicare Other | Admitting: Pharmacist

## 2016-03-16 ENCOUNTER — Encounter: Payer: Self-pay | Admitting: Pharmacist

## 2016-03-16 VITALS — BP 126/62 | HR 66 | Ht 66.0 in | Wt 232.0 lb

## 2016-03-16 DIAGNOSIS — E1140 Type 2 diabetes mellitus with diabetic neuropathy, unspecified: Secondary | ICD-10-CM

## 2016-03-16 DIAGNOSIS — Z0000 Encounter for general adult medical examination without abnormal findings: Secondary | ICD-10-CM

## 2016-03-16 DIAGNOSIS — IMO0002 Reserved for concepts with insufficient information to code with codable children: Secondary | ICD-10-CM

## 2016-03-16 DIAGNOSIS — Z23 Encounter for immunization: Secondary | ICD-10-CM

## 2016-03-16 DIAGNOSIS — E1165 Type 2 diabetes mellitus with hyperglycemia: Secondary | ICD-10-CM

## 2016-03-16 DIAGNOSIS — H9193 Unspecified hearing loss, bilateral: Secondary | ICD-10-CM

## 2016-03-16 LAB — BAYER DCA HB A1C WAIVED: HB A1C: 7.8 % — AB (ref <7.0)

## 2016-03-16 MED ORDER — INSULIN NPH (HUMAN) (ISOPHANE) 100 UNIT/ML ~~LOC~~ SUSP: 40.0000 [IU] | Freq: Two times a day (BID) | SUBCUTANEOUS | Status: DC

## 2016-03-16 NOTE — Progress Notes (Signed)
Patient ID: Manuel Kline, male   DOB: 05/24/1932, 82 y.o.   MRN: [id number]    Subjective:   Manuel Kline is a 82 y.o. male who presents for a Subsequent Medicare Annual Wellness Visit and to reassess diabetes.   Married.  Retired - was a Development worker, community for 50 years.  Has 4 children, 8 grandchildren and 5 great grandchildren. 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     Review of Systems  Constitutional: Negative.   HENT: Positive for hearing loss.   Eyes: Negative.   Respiratory: Negative.   Cardiovascular: Positive for claudication.  Gastrointestinal: Negative.   Genitourinary: Negative.   Musculoskeletal: Positive for joint pain (both hips).       Has neuropathy in both feet   Skin: Negative.   Neurological: Negative.  Negative for weakness (in his legs).  Endo/Heme/Allergies: Negative.   Psychiatric/Behavioral: Negative.     HBG readings: 7 d avg = 117 14 d avg = 132 30 d avg = 154 90 d avg = 178  151, 105, 59, 74, 146, 94, 143, 166, 184, 150, 138, 98, 166, 170, 179, 143, 125, 236, 150 ,136, 214, 199, 259, 211, 241, 266, 121, 90, 123, 172  Last A1c was 8.0% 11/29/2015  He is currently taking Humalog 30units prior to each meal units and Levemir 40 units bid.  ALthough patient now qualifies for extra assistance for medications trhough Medicare Part D his copay for Humalog still $90 per month and Levemir is about $150 per month. He would like a cheaper alternative for Levemir.   Current Medications (verified) Outpatient Encounter Prescriptions as of 03/16/2016  Medication Sig  . allopurinol (ZYLOPRIM) 300 MG tablet TAKE ONE-HALF TABLET BY MOUTH ONCE DAILY  . aspirin 81 MG tablet Take 81 mg by mouth daily.   Marland Kitchen atorvastatin (LIPITOR) 80 MG tablet Take 40 mg by mouth daily.  . cloNIDine (CATAPRES) 0.1 MG tablet Take 1 tablet by mouth 2 (two) times daily.  . fluticasone (FLONASE) 50 MCG/ACT nasal spray Place 1 spray into both nostrils 2 (two) times  daily as needed for allergies or rhinitis.  . furosemide (LASIX) 40 MG tablet Take 1 tablet by mouth 2 (two) times daily.  Marland Kitchen gabapentin (NEURONTIN) 300 MG capsule TAKE ONE CAPSULE BY MOUTH TWICE DAILY  . glucose blood (ACCU-CHEK AVIVA) test strip 1 each by Other route 4 (four) times daily. DX: E11.40  . insulin lispro (HUMALOG) 100 UNIT/ML KiwkPen Inject 0.3 mLs (30 Units total) into the skin 3 (three) times daily.  . metoprolol succinate (TOPROL-XL) 50 MG 24 hr tablet Take 50 mg by mouth daily.  . Multiple Vitamin (MULTIVITAMIN) tablet Take 1 tablet by mouth daily. Eye formula  . Omega-3 Fatty Acids (FISH OIL) 1000 MG CPDR Take 1 tablet by mouth daily.  Marland Kitchen omeprazole (PRILOSEC) 20 MG capsule TAKE ONE CAPSULE BY MOUTH TWICE DAILY BEFORE  A  MEAL  . ondansetron (ZOFRAN-ODT) 4 MG disintegrating tablet Take 1 tablet (4 mg total) by mouth every 12 (twelve) hours.  . quinapril (ACCUPRIL) 40 MG tablet Take 20 mg by mouth at bedtime.  . [DISCONTINUED] LEVEMIR FLEXTOUCH 100 UNIT/ML Pen Inject 40 Units into the skin 2 (two) times daily.  . insulin NPH Human (HUMULIN N,NOVOLIN N) 100 UNIT/ML injection Inject 0.4 mLs (40 Units total) into the skin 2 (two) times daily before a meal.  . [DISCONTINUED] insulin aspart (NOVOLOG) 100 UNIT/ML injection Inject 30 Units into the  skin 3 (three) times daily before meals.  . [DISCONTINUED] Insulin Degludec (TRESIBA FLEXTOUCH) 200 UNIT/ML SOPN Inject 80 Units into the skin at bedtime. (Patient not taking: Reported on 03/16/2016)   No facility-administered encounter medications on file as of 03/16/2016.    Allergies (verified) Review of patient's allergies indicates no known allergies.   History: Past Medical History  Diagnosis Date  . CAD (coronary artery disease)   . S/P CABG (coronary artery bypass graft) 11/02/2000  . Hypertensive heart disease without CHF 07/31/2011  . ICD dual chamber in situ   . Sleep apnea   . Morbid obesity ([place])   . Erosive esophagitis     . Cardiomyopathy   . Erectile dysfunction   . Metabolic syndrome   . Osteoarthritis   . Calcium oxalate renal stones   . Adenomatous colon polyp 2006  . Hemorrhoids   . Diabetes ([place])   . HTN (hypertension)   . HLD (hyperlipidemia)   . ICD (implantable cardiac defibrillator) in place   . Cataract    Past Surgical History  Procedure Laterality Date  . Doppler echocardiography  2003  . Back surgery      X'3  . Carpal tunnel release      X2, bilateral  . Abdominal exploration surgery    . Laparotomy    . Cardiac bypass    . Cardiac defibrillator placement    . Esophagogastroduodenoscopy  06/20/2012    Procedure: ESOPHAGOGASTRODUODENOSCOPY (EGD);  Surgeon: Sable Feil, MD;  Location: [facility];  Service: Endoscopy;  Laterality: N/A;  . Colonoscopy  06/20/2012    Procedure: COLONOSCOPY;  Surgeon: Sable Feil, MD;  Location: [facility];  Service: Endoscopy;  Laterality: N/A;  . Rotator cuff surgery      left  . Eye surgery    . Retinopathy surgery Bilateral   . Cataract extraction     Family History  Problem Relation Age of Onset  . Diabetes Brother   . Heart disease Father   . Heart disease Mother   . Diabetes Mother   . Throat cancer Paternal Uncle   . Diabetes Brother   . Diabetes Brother    Social History   Occupational History  . Retired from [employer]  . Smoking status: Never Smoker   . Smokeless tobacco: Never Used  . Alcohol Use: No  . Drug Use: No  . Sexual Activity: Yes    Do you feel safe at home?  Yes No smokers in home  Dietary issues and exercise activities discussed: Current Exercise Habits: The patient does not participate in regular exercise at present, Exercise limited by: cardiac condition(s);neurologic condition(s)  Current Dietary habits:  Trying to limit CHO, caloric and sugar intake  Cardiac Risk Factors include: advanced age (>63men, >53 women);diabetes  mellitus;dyslipidemia;family history of premature cardiovascular disease;hypertension;male gender;obesity (BMI >30kg/m2);sedentary lifestyle  Objective:    Today's Vitals   03/16/16 1337  BP: 126/62  Pulse: 66  Height: 5\' 6"  (1.676 m)  Weight: 232 lb (105.235 kg)  PainSc: 2    Body mass index is 37.46 kg/(m^2).   Activities of Daily Living In your present state of health, do you have any difficulty performing the following activities: 03/16/2016  Hearing? Y  Vision? N  Difficulty concentrating or making decisions? N  Walking or climbing stairs? Y  Dressing or bathing? N  Doing errands, shopping? N  Preparing Food and eating ? N  Using the Toilet? N  In  the past six months, have you accidently leaked urine? N  Do you have problems with loss of bowel control? N  Managing your Medications? Y  Managing your Finances? N  Housekeeping or managing your Housekeeping? N    Are there smokers in your home (other than you)? No    Depression Screen PHQ 2/9 Scores 03/16/2016 11/29/2015 11/01/2015 08/29/2015  PHQ - 2 Score 0 0 0 0    Fall Risk Fall Risk  03/16/2016 11/29/2015 11/01/2015 08/29/2015 03/01/2015  Falls in the past year? No No No No Yes  Number falls in past yr: - - - - 1  Injury with Fall? - - - - Yes  Risk Factor Category  - - - - High Fall Risk  Risk for fall due to : - - - - Other (Comment)  Risk for fall due to (comments): - - - - PATIENT HAS HYPOGLYCEMIC EVENT  Follow up - - - - Falls prevention discussed;Education provided    Cognitive Function: MMSE - Mini Mental State Exam 03/16/2016 03/01/2015  Orientation to time 5 5  Orientation to Place 5 5  Registration 3 3  Attention/ Calculation 4 3  Recall 3 3  Language- name 2 objects 2 2  Language- repeat 1 1  Language- follow 3 step command 3 3  Language- read & follow direction 1 1  Write a sentence 1 1  Copy design 1 1  Total score 29 28    Immunizations and Health Maintenance Immunization History  Administered  Date(s) Administered  . Influenza,inj,Quad PF,36+ Mos 06/12/2014, 06/24/2015  . Pneumococcal Conjugate-13 03/01/2015  . Pneumococcal Polysaccharide-23 03/16/2016   Health Maintenance Due  Topic Date Due  . TETANUS/TDAP  01/17/1953  . ZOSTAVAX  01/17/1994  . PNA vac Low Risk Adult (2 of 2 - PPSV23) 02/29/2016    Patient Care Team: Eustaquio Maize, MD as PCP - General (Pediatrics) Regenia Skeeter, MD as Consulting Physician (Internal Medicine) Wallene Huh, DPM as Consulting Physician (Podiatry) Jacolyn Reedy, MD as Consulting Physician (Cardiology) Hurman Horn, MD as Consulting Physician (Ophthalmology) Evans Lance, MD as Consulting Physician (Cardiology) Gardiner Barefoot, DPM as Consulting Physician (Podiatry)  Indicate any recent Medical Services you may have received from other than [facility] providers in the past year (date may be approximate).    Assessment:    Annual Wellness Visit  Type 2 DM requiring insulin - uncontrolled Medication management  Screening Tests Health Maintenance  Topic Date Due  . TETANUS/TDAP  01/17/1953  . ZOSTAVAX  01/17/1994  . PNA vac Low Risk Adult (2 of 2 - PPSV23) 02/29/2016  . INFLUENZA VACCINE  03/31/2016  . OPHTHALMOLOGY EXAM  04/14/2016  . HEMOGLOBIN A1C  05/30/2016  . FOOT EXAM  11/28/2016        Plan:   During the course of the visit Manuel Kline was educated and counseled about the following appropriate screening and preventive services:   Vaccines to include Pneumoccal, Influenza, Hepatitis B, Td, Zostavax - Patient received Pneumovax 23 in office today. Checked into Zostavax ($37.19) and Boostrix ($9.34) cost - patient wanted to postpone both of these vaccines today.  Colorectal cancer screening - colonoscopy UTD  Cardiovascular disease screening - sees Dr Wynonia Lawman - requesting last office notes to review claudication / consider ABI / vascular workup if not done recently  BP was at goal today; last lipids panel showed LDL at  goal and slightly elevated Tg.   Diabetes - checked A1c today (was 7.8%)  and urine microalbumin - pending  Change Levemir to Novolin NPH - 40 units bid to decrease cost to patient  Glaucoma screening / Diabetic Eye Exam - UTD (remieded that is due 03/2016)  Nutrition counseling - reveiewed CHO counting with patient.  Also discussed BMI goal - would like to see 10% weight loss (about 23#).  Increase physical activity as able - discussed chair exercises or walking to start.   Advanced directives - UTD  Referral for audiologist assessment.  Appointment made to follow up with PCP in 3 months  Orders Placed This Encounter  Procedures  . Pneumococcal polysaccharide vaccine 23-valent greater than or equal to 2yo subcutaneous/IM  . Lipid panel    This external order was created through the Results Console.  Pleas Patricia DCA Hb A1c Waived  . Microalbumin / creatinine urine ratio  . Ambulatory referral to Audiology    Referral Priority:  Routine    Referral Type:  Audiology Exam    Referral Reason:  Specialty Services Required    Number of Visits Requested:  1      Goals    . Increase physical activity     Review that handout I gave you for chair exercise.  Try to do these at least once daily - 5 days per week.         Patient Instructions (the written plan) were given to the patient.   Cherre Robins, [facility]   03/16/2016

## 2016-03-16 NOTE — Patient Instructions (Addendum)
  Mr. Varland , Thank you for taking time to come for your Medicare Wellness Visit. I appreciate your ongoing commitment to your health goals. Please review the following plan we discussed and let me know if I can assist you in the future.   These are the goals we discussed:  We will send referral for audiologist (hearing check)  Goals    . Increase physical activity     Review the handout I gave you for chair exercise.  Try to do these at least once daily - 5 days per week.     . Limit high sugar and high carbohydrates foods to help with blood glucose and triglycerides      Increase non-starchy vegetables - carrots, green bean, squash, zucchini, tomatoes, onions, peppers, spinach and other green leafy vegetables, cabbage, lettuce, cucumbers, asparagus, okra (not fried), eggplant limit sugar and processed foods (cakes, cookies, ice cream, crackers and chips) Increase fresh fruit but limit serving sizes 1/2 cup or about the size of tennis or baseball limit red meat to no more than 1-2 times per week (serving size about the size of your palm) Choose whole grains / lean proteins - whole wheat bread, quinoa, whole grain rice (1/2 cup), fish, chicken, [place]        This is a list of the screening recommended for you and due dates:  Health Maintenance  Topic Date Due  . Tetanus Vaccine  Due now - cost $9.34  . Shingles Vaccine  Due now - cost $37.19  . Pneumonia vaccines (2 of 2 - PPSV23) Completed today  . Flu Shot  03/31/2016  . Eye exam for diabetics  04/14/2016  . Hemoglobin A1C  06/16/2016  . Complete foot exam   11/28/2016

## 2016-03-17 DIAGNOSIS — G4733 Obstructive sleep apnea (adult) (pediatric): Secondary | ICD-10-CM | POA: Diagnosis not present

## 2016-03-17 LAB — MICROALBUMIN / CREATININE URINE RATIO: CREATININE, UR: 52.4 mg/dL

## 2016-03-20 ENCOUNTER — Encounter: Payer: Self-pay | Admitting: Pharmacist

## 2016-03-20 ENCOUNTER — Ambulatory Visit (INDEPENDENT_AMBULATORY_CARE_PROVIDER_SITE_OTHER): Payer: Medicare Other | Admitting: Podiatry

## 2016-03-20 ENCOUNTER — Encounter: Payer: Self-pay | Admitting: Podiatry

## 2016-03-20 DIAGNOSIS — M79676 Pain in unspecified toe(s): Secondary | ICD-10-CM | POA: Diagnosis not present

## 2016-03-20 DIAGNOSIS — Z794 Long term (current) use of insulin: Secondary | ICD-10-CM

## 2016-03-20 DIAGNOSIS — E1140 Type 2 diabetes mellitus with diabetic neuropathy, unspecified: Secondary | ICD-10-CM | POA: Diagnosis not present

## 2016-03-20 DIAGNOSIS — M79609 Pain in unspecified limb: Principal | ICD-10-CM

## 2016-03-20 DIAGNOSIS — B351 Tinea unguium: Secondary | ICD-10-CM

## 2016-03-20 NOTE — Progress Notes (Signed)
Patient ID: KESLEY KEGG, male   DOB: 1932/08/21, 82 y.o.   MRN: [id number] Complaint:  Visit Type: Patient returns to my office for continued preventative foot care services. Complaint: Patient states" my nails have grown long and thick and become painful to walk and wear shoes" Patient has been diagnosed with DM with neuropathy. The patient presents for preventative foot care services. No changes to ROS.  Pain is noted great toe left foot at nail site.  Podiatric Exam: Vascular: dorsalis pedis and posterior tibial pulses are palpable bilateral. Capillary return is immediate. Temperature gradient is WNL. Skin turgor WNL  Sensorium: Absent Semmes Weinstein monofilament test. Normal tactile sensation bilaterally. Nail Exam: Pt has thick disfigured discolored nails with subungual debris noted bilateral entire nail second  through fifth toenails.  Absent nail plate left hallux.  No inflammation. Ulcer Exam: There is no evidence of ulcer or pre-ulcerative changes or infection. Hammer toe 2 B/L.  Cavus foot type. Orthopedic Exam: Muscle tone and strength are WNL. No limitations in general ROM. No crepitus or effusions noted. Foot type and digits show no abnormalities. Bony prominences are unremarkable. Skin:  . No infection or ulcers  Diagnosis:  Onychomycosis, , Pain in right toe, pain in left toes  Treatment & Plan Procedures and Treatment: Consent by patient was obtained for treatment procedures. The patient understood the discussion of treatment and procedures well. All questions were answered thoroughly reviewed. Debridement of mycotic and hypertrophic toenails, 2 through 5 bilateral and clearing of subungual debris. No ulceration, no infection noted.  Prescribe neosporin with lidocaine.                Return Visit-Office Procedure: Patient instructed to return to the office for a follow up visit 3 months for continued evaluation and treatment.    Gardiner Barefoot DPM

## 2016-04-13 ENCOUNTER — Other Ambulatory Visit: Payer: Self-pay | Admitting: *Deleted

## 2016-04-13 MED ORDER — GLUCOSE BLOOD VI STRP: ORAL_STRIP | 12 refills | Status: DC

## 2016-04-17 ENCOUNTER — Other Ambulatory Visit: Payer: Self-pay | Admitting: Pediatrics

## 2016-05-20 ENCOUNTER — Telehealth: Payer: Self-pay | Admitting: Pediatrics

## 2016-05-20 MED ORDER — INSULIN REGULAR HUMAN 100 UNIT/ML IJ SOLN: 30.0000 [IU] | Freq: Three times a day (TID) | INTRAMUSCULAR | 1 refills | Status: DC

## 2016-05-20 NOTE — Telephone Encounter (Signed)
I left #2 pens of humalog insulin for patient. Per patient cost of Humalog is $165.00 per month. I also sent in Rx for Novolin R / relion R insulin to start after he finished Humalog.  Patient to bring by insurance statement so I can see if he has reached coverage gap.

## 2016-06-09 ENCOUNTER — Ambulatory Visit (INDEPENDENT_AMBULATORY_CARE_PROVIDER_SITE_OTHER): Payer: Medicare Other

## 2016-06-09 ENCOUNTER — Encounter: Payer: Self-pay | Admitting: Family Medicine

## 2016-06-09 ENCOUNTER — Ambulatory Visit (INDEPENDENT_AMBULATORY_CARE_PROVIDER_SITE_OTHER): Payer: Medicare Other | Admitting: Family Medicine

## 2016-06-09 VITALS — BP 105/54 | HR 59 | Temp 96.7°F | Ht 66.0 in | Wt 245.0 lb

## 2016-06-09 DIAGNOSIS — M25532 Pain in left wrist: Secondary | ICD-10-CM

## 2016-06-09 DIAGNOSIS — S52532A Colles' fracture of left radius, initial encounter for closed fracture: Secondary | ICD-10-CM | POA: Diagnosis not present

## 2016-06-09 DIAGNOSIS — R0781 Pleurodynia: Secondary | ICD-10-CM | POA: Diagnosis not present

## 2016-06-09 DIAGNOSIS — S52125A Nondisplaced fracture of head of left radius, initial encounter for closed fracture: Secondary | ICD-10-CM | POA: Diagnosis not present

## 2016-06-09 NOTE — Progress Notes (Signed)
Subjective:  Patient ID: Manuel Kline, male    DOB: 1932/01/13  Age: 82 y.o. MRN: [id number]  CC: Fall (pt here today after falling when unloading pumpkins at church, his foot gave way, no SOB or chest pain or dizziness. )   HPI Manuel Kline presents for Fall yesterday morning. Landed on the outstretched left arm. Had significant pain. Has been putting ice on it. Pain and swelling getting worse. Also hit the lower lateral chest on the left side. Having some shortness of breath due to pain and splinting. Denies hematemesis/hemoptysis  History Manuel Kline has a past medical history of Adenomatous colon polyp (2006); CAD (coronary artery disease); Calcium oxalate renal stones; Cardiomyopathy; Cataract; Diabetes ([place]); Erectile dysfunction; Erosive esophagitis; Hemorrhoids; HLD (hyperlipidemia); HTN (hypertension); Hypertensive heart disease without CHF (07/31/2011); ICD (implantable cardiac defibrillator) in place; ICD dual chamber in situ; Metabolic syndrome; Morbid obesity ([place]); Osteoarthritis; S/P CABG (coronary artery bypass graft) (11/02/2000); and Sleep apnea.   He has a past surgical history that includes doppler echocardiography (2003); Back surgery; Carpal tunnel release; Abdominal exploration surgery; laparotomy; cardiac bypass; Cardiac defibrillator placement; Esophagogastroduodenoscopy (06/20/2012); Colonoscopy (06/20/2012); rotator cuff surgery; Eye surgery; Retinopathy surgery (Bilateral); and Cataract extraction.   His family history includes Diabetes in his brother, brother, brother, and mother; Heart disease in his father and mother; Throat cancer in his paternal uncle.He reports that he has never smoked. He has never used smokeless tobacco. He reports that he does not drink alcohol or use drugs.    ROS Review of Systems  Constitutional: Negative for chills, diaphoresis and fever.  HENT: Negative for rhinorrhea and sore throat.   Respiratory: Positive for shortness of breath (due to pain).  Negative for cough, wheezing and stridor.   Cardiovascular: Negative for chest pain.  Gastrointestinal: Negative for abdominal pain.  Musculoskeletal: Positive for arthralgias and myalgias.  Skin: Negative for rash.  Neurological: Negative for weakness and headaches.    Objective:  BP (!) 105/54   Pulse (!) 59   Temp (!) 96.7 F (35.9 C) (Oral)   Ht 5\' 6"  (1.676 m)   Wt 245 lb (111.1 kg)   BMI 39.54 kg/m   BP Readings from Last 3 Encounters:  06/09/16 (!) 105/54  03/16/16 126/62  12/17/15 128/72    Wt Readings from Last 3 Encounters:  06/09/16 245 lb (111.1 kg)  03/16/16 232 lb (105.2 kg)  12/17/15 233 lb 6.4 oz (105.9 kg)     Physical Exam  Constitutional: He is oriented to person, place, and time. He appears well-developed and well-nourished.  HENT:  Head: Normocephalic and atraumatic.  Right Ear: External ear normal.  Left Ear: External ear normal.  Mouth/Throat: No oropharyngeal exudate or posterior oropharyngeal erythema.  Eyes: Pupils are equal, round, and reactive to light.  Neck: Normal range of motion. Neck supple.  Cardiovascular: Normal rate and regular rhythm.   No murmur heard. Pulmonary/Chest: Breath sounds normal. No respiratory distress.  Musculoskeletal: He exhibits edema (at left wrist.) and tenderness.  Neurological: He is alert and oriented to person, place, and time.  Vitals reviewed.    Lab Results  Component Value Date   WBC 10.7 08/29/2015   HGB 12.7 (A) 03/19/2015   HCT 40.7 08/29/2015   PLT 183 08/29/2015   GLUCOSE 315 (H) 08/29/2015   CHOL 125 03/02/2016   TRIG 229 (A) 03/02/2016   HDL 29 (A) 03/02/2016   LDLCALC 50 03/02/2016   ALT 21 08/29/2015   AST 17 08/29/2015   NA  137 08/29/2015   K 5.4 (H) 08/29/2015   CL 94 (L) 08/29/2015   CREATININE 1.90 (H) 08/29/2015   BUN 39 (H) 08/29/2015   CO2 21 08/29/2015   INR 1.1 ratio (H) 08/26/2009   HGBA1C 8.5 08/29/2015    Ct Shoulder Left W Contrast  Result Date:  11/21/2013 CLINICAL DATA:  Shoulder pain. EXAM: CT ARTHROGRAPHY OF THE left shoulder TECHNIQUE: Multidetector CT imaging was performed following the standard protocol after injection of dilute contrast into the joint. COMPARISON:  None. FINDINGS: There are moderate AC joint degenerative changes with superior and inferior spurring. The acromion is relatively flat or type 1 and shape. There is undersurface spurring change. The possible healing distal clavicle fracture is noted. Any recent history of trauma? There is a small full-thickness rotator cuff tear with extravasation of contrast into the subacromial/subdeltoid bursa. This involves the anterior attachment region of the supraspinatus tendon. There is also partial thickness tearing of the subscapularis tendon with infiltration of contrast material into the tendon. The infraspinatus tendon is intact. The long head biceps tendon is intact and the glenoid labrum appear normal. There is a prominent sub labral sulcus involving the superior labrum. IMPRESSION: Small full-thickness supraspinatus tendon tear anteriorly near its junction with the subscapularis tendon. There is no significant retraction and the tear is only a few mm wide. Intact long head biceps tendon and glenoid labrum. AC joint degenerative changes with inferior spurring may contribute to bony impingement. Possible healing distal clavicle fracture. Electronically Signed   By: Kalman Jewels M.D.   On: 11/21/2013 16:16   Dg Fluoro Guide Ndl Plc/bx  Result Date: 11/21/2013 CLINICAL DATA:  Left shoulder pain. FLUOROSCOPY TIME:  32 seconds PROCEDURE: Left SHOULDER INJECTION UNDER FLUOROSCOPY An appropriate skin entrance site was determined. The site was marked, prepped with Betadine, draped in the usual sterile fashion, and infiltrated locally with 1% Lidocaine. 22 gauge spinal needle was advanced to the superomedial margin of the humeral head under intermittent fluoroscopy. 1 ml of Lidocaine injected  easily. A mixture of 5 mL 1% lidocaine and 15 ml of dilute Omnipaque 180 was then used to opacify the left shoulder capsule. No immediate complication. IMPRESSION: Technically successful left shoulder injection for CT arthrography. Electronically Signed   By: Rolla Flatten M.D.   On: 11/21/2013 15:12    Assessment & Plan:   Quintell was seen today for fall.  Diagnoses and all orders for this visit:  Wrist pain, acute, left -     DG Wrist Complete Left; Future  Rib pain on left side -     DG Ribs Unilateral W/Chest Left; Future  Closed nondisplaced fracture of head of left radius, initial encounter -     Ambulatory referral to Orthopedic Surgery    X-ray shows impacted fracture of the radial styloid.  I have discontinued Mr. Choplin's ondansetron. I am also having him maintain his aspirin, quinapril, multivitamin, Fish Oil, metoprolol succinate, furosemide, atorvastatin, gabapentin, fluticasone, omeprazole, cloNIDine, insulin NPH Human, glucose blood, allopurinol, and insulin regular.  No orders of the defined types were placed in this encounter.  Immediate referral to fracture clinic at orthopedics arranged for patient. Follow-up: Return if symptoms worsen or fail to improve.  Claretta Fraise, M.D.

## 2016-06-10 ENCOUNTER — Ambulatory Visit: Payer: Medicare Other | Attending: Pediatrics | Admitting: Audiology

## 2016-06-10 DIAGNOSIS — R292 Abnormal reflex: Secondary | ICD-10-CM | POA: Insufficient documentation

## 2016-06-10 DIAGNOSIS — R94128 Abnormal results of other function studies of ear and other special senses: Secondary | ICD-10-CM | POA: Insufficient documentation

## 2016-06-10 DIAGNOSIS — H903 Sensorineural hearing loss, bilateral: Secondary | ICD-10-CM

## 2016-06-10 DIAGNOSIS — H93299 Other abnormal auditory perceptions, unspecified ear: Secondary | ICD-10-CM

## 2016-06-10 DIAGNOSIS — Z01118 Encounter for examination of ears and hearing with other abnormal findings: Secondary | ICD-10-CM | POA: Insufficient documentation

## 2016-06-10 NOTE — Procedures (Signed)
[facility] [place]  [place], [place] [address]  [phone]   Audiological Evaluation  Patient Name: Manuel Kline    Status: Outpatient   DOB: 05-Feb-1932    Diagnosis: Hearing Loss                Abnormal hearing screen MRN: [id number] Date:  06/10/2016     Referent: Eustaquio Maize, MD  History: Manuel Kline was seen for an audiological evaluation. Accompanied by: His wife Primary Concern: Difficulty hearing in most social situations.  Is a Retail banker at [employer] and his wife has to tell him when someone asks in to do something. He states that he can't hear much of the time.  History of hearing problems: Y  History of dizziness or unsteadiness:   Y  Thinks is due to diabetes effects and heart issues.  Currently has a "defib implant" that "needs a new battery" History of balance issues:  Y / N History of occupational noise exposure:  Y History of hypertension: Y History of diabetes:  Y  Family history of hearing loss: N Other issues: Has a heart problem (with defib implant, patient of Dr. Lovena Le), kidney issues and recently feel and broke arm and is being followed by orthopedics.    Evaluation: Conventional pure tone audiometry from _0  - _1  with using insert earphones. Hearing thresholds are symmetrical ranging from 40-45 dBHL at _2 ; 50-55 dbHL at _3 ; 65-70 dBHL at _4  and 80-85 dBHL at _5 .  The hearing loss is sensorineural bilaterally. Reliability is good Speech reception levels (repeating words near threshold) using recorded spondee word lists:  Right ear: 60 dBHL.  Left ear:  65 dBHL Word recognition (at comfortably loud volumes) using recorded NU-6 word lists, in quiet. Please note that TARIG ZIMMERS reported 85 dBHL as most comfortable listening level, but he has much better word recognition at [place] presentation level. Right ear: 64% at 90dBHL.  Left ear:   26% at 90 dBHL. Binaural:  100% at 80 dBHL     (Binaural  hearing aids are strongly recommended and are medically supported by these test results).  Tympanometry (middle ear function) showed normal middle ear volume, pressure and compliance bilaterally (Type A) - equipment malfunction prevented printout for this report. Ipsilateral acoustic reflexes agree with the test results ranging from 90db at _6  to no response at _7 .    CONCLUSION:      Manuel Kline has a significant hearing loss bilaterally that will adversely affect his ability to hear and communicate at conversational speech levels.  Binaural hearing aids are strongly recommended and are considered medically necessary for [employer].   Manuel Kline has a moderate sloping to severe sensorineural hearing loss bilaterally.  Some recruitment present.  Manuel Kline has poor to very poor word recognition when presented to each ear alone, even at very loud, but comfortable levels. However, when presented to both ears at the same time he has excellent word recognition.  This much improvement with binaural vs monaural presentation is unusual, but strongly indicates the need for Manuel Kline to be fit with a hearing aid in each ear.  Manuel Kline states that he has hearing aid coverage through his insurance.  He was advised to find out which providers he has access to and to make an appointment.  However, should hearing aids not be covered then other options were discussed and are listed below.   Amplification helps  make the signal louder and therefore often improves hearing and word recognition.  Amplification has many forms including hearing aids in one or both ears, an assistive listening device which have a microphone and speaker such as a small handheld device and/or even a surround sound system of speakers.  Amplification may be covered by some insurances, but not all.  It is important to note that hearing aids must be individually fit according to the hearing test results and the ear shape.  Audiologists  and hearing aid dealers in [place] must be licensed in order to dispense hearing aids.  In addition, a trial period is mandated by law in our state because often amplification must be tried and then evaluated in order to determine benefit.  RECOMMENDATIONS: 1.   Contact insurance about hearing aid benefit and which provider Manuel Kline may go to. 2.   A hearing aid evaluation for binaural hearing aids. 3.   Monitor hearing with a repeat audiological evaluation in 6-12 months (earlier if there is any change in hearing or ear pressure) - here or with an audiologist.   4.   Strategies that help improve hearing include: A) Face the speaker directly. Optimal is having the speakers face well - lit.  Unless amplified, being within 3-6 feet of the speaker will enhance word recognition. B) Avoid having the speaker back-lit as this will minimize the ability to use cues from lip-reading, facial expression and gestures. C)  Word recognition is poorer in background noise. For optimal word recognition, turn off the TV, radio or noisy fan when engaging in conversation. In a restaurant, try to sit away from noise sources and close to the primary speaker.  D)  Ask for topic clarification from time to time in order to remain in the conversation.  Most people don't mind repeating or clarifying a point when asked.  If needed, explain the difficulty hearing in background noise or hearing loss. 5.   [employer] in [place] may help with obtaining one hearing aid or one captioned telephone if hearing loss and financial qualifications are met. Please contact Robin Searing at [phone] for a current list of providers. Providers local to [place] include: [facility] [address] [place], [place] [address] Telephone:  [phone]              [facility]             [address] [place], [place]  [address]              [phone]               [facility]             Department of Audiology             [address]             [place], [place]  [address]             [phone]              [address]             [place],  [place]  [address]             [place]  Domingo Pulse, Au.D., CCC-A Doctor of Audiology 06/10/2016  cc: Eustaquio Maize, MD

## 2016-06-17 ENCOUNTER — Ambulatory Visit (INDEPENDENT_AMBULATORY_CARE_PROVIDER_SITE_OTHER): Payer: Medicare Other | Admitting: Pediatrics

## 2016-06-17 ENCOUNTER — Encounter: Payer: Self-pay | Admitting: Pediatrics

## 2016-06-17 VITALS — BP 105/64 | HR 60 | Temp 96.4°F | Ht 66.0 in | Wt 242.0 lb

## 2016-06-17 DIAGNOSIS — N183 Chronic kidney disease, stage 3 unspecified: Secondary | ICD-10-CM

## 2016-06-17 DIAGNOSIS — Z23 Encounter for immunization: Secondary | ICD-10-CM

## 2016-06-17 DIAGNOSIS — I2510 Atherosclerotic heart disease of native coronary artery without angina pectoris: Secondary | ICD-10-CM

## 2016-06-17 DIAGNOSIS — E1165 Type 2 diabetes mellitus with hyperglycemia: Secondary | ICD-10-CM

## 2016-06-17 DIAGNOSIS — I5022 Chronic systolic (congestive) heart failure: Secondary | ICD-10-CM | POA: Diagnosis not present

## 2016-06-17 DIAGNOSIS — E1142 Type 2 diabetes mellitus with diabetic polyneuropathy: Secondary | ICD-10-CM

## 2016-06-17 DIAGNOSIS — IMO0002 Reserved for concepts with insufficient information to code with codable children: Secondary | ICD-10-CM

## 2016-06-17 DIAGNOSIS — E1140 Type 2 diabetes mellitus with diabetic neuropathy, unspecified: Secondary | ICD-10-CM | POA: Diagnosis not present

## 2016-06-17 LAB — BMP8+EGFR
BUN/Creatinine Ratio: 14 (ref 10–24)
BUN: 32 mg/dL — AB (ref 8–27)
CALCIUM: 9.3 mg/dL (ref 8.6–10.2)
CO2: 26 mmol/L (ref 18–29)
CREATININE: 2.24 mg/dL — AB (ref 0.76–1.27)
Chloride: 96 mmol/L (ref 96–106)
GFR calc Af Amer: 30 mL/min/{1.73_m2} — ABNORMAL LOW (ref >59)
GFR, EST NON AFRICAN AMERICAN: 26 mL/min/{1.73_m2} — AB (ref >59)
Glucose: 109 mg/dL — ABNORMAL HIGH (ref 65–99)
POTASSIUM: 4.3 mmol/L (ref 3.5–5.2)
Sodium: 140 mmol/L (ref 134–144)

## 2016-06-17 LAB — BAYER DCA HB A1C WAIVED: HB A1C (BAYER DCA - WAIVED): 7.4 % — ABNORMAL HIGH (ref <7.0)

## 2016-06-17 NOTE — Progress Notes (Signed)
  Subjective:   Patient ID: Manuel Kline, male    DOB: 10-Oct-1931, 82 y.o.   MRN: [id number] CC: Follow-up (Diabetes)  HPI: Manuel Kline is a 82 y.o. male presenting for Follow-up (Diabetes)  Hearing problem: recommended that he get hearing aids He is planning to call insurance company to talk with them about payment  DM2: This morning BGLs 100s Is elevated over 200s when he has dessert the night before  Recent fall causing L wrist fracture Says he remembers his R foot feeling funny, then falling to the L Denies lightheadedness, dizziness Needs battery replacement in defibrillator  OSA: on CPAP  CAD: no chest pain  Relevant past medical, surgical, family and social history reviewed. Allergies and medications reviewed and updated. History  Smoking Status  . Never Smoker  Smokeless Tobacco  . Never Used   ROS: Per HPI   Objective:    BP 105/64   Pulse 60   Temp (!) 96.4 F (35.8 C)   Ht '5\' 6"'$  (1.676 m)   Wt 242 lb (109.8 kg)   BMI 39.06 kg/m   Wt Readings from Last 3 Encounters:  06/17/16 242 lb (109.8 kg)  06/09/16 245 lb (111.1 kg)  03/16/16 232 lb (105.2 kg)    Gen: NAD, alert, cooperative with exam, NCAT EYES: EOMI, no conjunctival injection, or no icterus ENT:  TMs pearly gray b/l, OP without erythema LYMPH: no cervical LAD CV: NRRR, normal S1/S2, no murmur, distal pulses 2+ b/l Resp: CTABL, no wheezes, normal WOB Ext: No edema, warm Neuro: Alert and oriented, strength equal b/l UE and LE, coordination grossly normal MSK: L wrist in cast  Assessment & Plan:  Genaro was seen today for follow-up med problems  Diagnoses and all orders for this visit:  Type 2 diabetes, uncontrolled, with neuropathy (HCC) Cont insulin Cont to avoid sweets -     Bayer DCA Hb A1c Waived  Coronary artery disease involving native coronary artery of native heart without angina pectoris No recent chest pain  Diabetic peripheral neuropathy (HCC) Cont gabapentin Cont to wear  diabetic shoes, check feet nightly  CKD (chronic kidney disease) stage 3, GFR 30-59 ml/min -     [id number]  Chronic systolic heart failure ([place]) euvolemic today  Encounter for immunization -     Flu Vaccine QUAD 36+ mos IM   Follow up plan: Return in about 3 months (around 09/17/2016). Assunta Found, MD [place]

## 2016-06-18 DIAGNOSIS — S52532D Colles' fracture of left radius, subsequent encounter for closed fracture with routine healing: Secondary | ICD-10-CM | POA: Diagnosis not present

## 2016-06-18 DIAGNOSIS — Z4789 Encounter for other orthopedic aftercare: Secondary | ICD-10-CM | POA: Diagnosis not present

## 2016-06-23 ENCOUNTER — Encounter: Payer: Self-pay | Admitting: Internal Medicine

## 2016-06-23 ENCOUNTER — Ambulatory Visit (INDEPENDENT_AMBULATORY_CARE_PROVIDER_SITE_OTHER): Payer: Medicare Other | Admitting: Internal Medicine

## 2016-06-23 VITALS — BP 116/42 | HR 60 | Ht 64.0 in | Wt 242.0 lb

## 2016-06-23 DIAGNOSIS — Z01812 Encounter for preprocedural laboratory examination: Secondary | ICD-10-CM | POA: Diagnosis not present

## 2016-06-23 DIAGNOSIS — Z95810 Presence of automatic (implantable) cardiac defibrillator: Secondary | ICD-10-CM

## 2016-06-23 NOTE — Patient Instructions (Addendum)
Medication Instructions:  Your physician recommends that you continue on your current medications as directed. Please refer to the Current Medication list given to you today.   Labwork: BMET / CBC on 07/03/16 at [facility]   Testing/Procedures: Generator change  Follow-Up: Follow-up for a wound check in the [facility] 10-14 days from 07/14/16  and with Dr. Lovena Le  91 days after 07/14/16.    Any Other Special Instructions Will Be Listed Below ---  Please wash with the CHG Soap the night before and morning of procedure (follow instruction page "Preparing For Surgery").   Report to the [place] [facility] at 07/14/16 at 7:30 AM  Nothing to eat or drink after midnight the night before procedure  Do not take any medication the morning prior to your procedure  You will need someone to drive you home after the procedure    If you need a refill on your cardiac medications before your next appointment, please call your pharmacy.

## 2016-06-23 NOTE — Progress Notes (Signed)
HPI [name] returns today for followup. He is a very pleasant 82 year old man with an ischemic cardiomyopathy, chronic systolic heart failure, diabetes, morbid obesity, hypertension, status post ICD implantation. In the interim, despite his multiple medical problems, he has remained stable. The patient denies chest pain, shortness of breath, or syncope.  No ICD shocks. He remains active in his garden. His device has reached ERI voltage. No other complaints.  No Known Allergies   Current Outpatient Prescriptions  Medication Sig Dispense Refill  . allopurinol (ZYLOPRIM) 300 MG tablet TAKE ONE-HALF TABLET BY MOUTH ONCE DAILY 45 tablet 0  . aspirin 81 MG tablet Take 81 mg by mouth daily.     Marland Kitchen atorvastatin (LIPITOR) 80 MG tablet Take 40 mg by mouth daily.    . cloNIDine (CATAPRES) 0.1 MG tablet Take 1 tablet by mouth 2 (two) times daily.    . fluticasone (FLONASE) 50 MCG/ACT nasal spray Place 1 spray into both nostrils 2 (two) times daily as needed for allergies or rhinitis. 16 g 6  . furosemide (LASIX) 40 MG tablet Take 1 tablet by mouth 2 (two) times daily.    Marland Kitchen gabapentin (NEURONTIN) 300 MG capsule TAKE ONE CAPSULE BY MOUTH TWICE DAILY 60 capsule 2  . glucose blood (ACCU-CHEK SMARTVIEW) test strip Check Blood sugar 4 times daily 150 each 12  . insulin NPH Human (HUMULIN N,NOVOLIN N) 100 UNIT/ML injection Inject 0.4 mLs (40 Units total) into the skin 2 (two) times daily before a meal. 30 mL 2  . insulin regular (NOVOLIN R RELION) 100 units/mL injection Inject 0.3 mLs (30 Units total) into the skin 3 (three) times daily before meals. 30 mL 1  . metoprolol succinate (TOPROL-XL) 50 MG 24 hr tablet Take 50 mg by mouth daily.    . Multiple Vitamin (MULTIVITAMIN) tablet Take 1 tablet by mouth daily. Eye formula    . Omega-3 Fatty Acids (FISH OIL) 1000 MG CPDR Take 1 tablet by mouth daily.    Marland Kitchen omeprazole (PRILOSEC) 20 MG capsule TAKE ONE CAPSULE BY MOUTH TWICE DAILY BEFORE  A  MEAL 60 capsule 5  .  quinapril (ACCUPRIL) 40 MG tablet Take 20 mg by mouth at bedtime.     No current facility-administered medications for this visit.      Past Medical History:  Diagnosis Date  . Adenomatous colon polyp 2006  . CAD (coronary artery disease)   . Calcium oxalate renal stones   . Cardiomyopathy   . Cataract   . Diabetes ([place])   . Erectile dysfunction   . Erosive esophagitis   . Hemorrhoids   . HLD (hyperlipidemia)   . HTN (hypertension)   . Hypertensive heart disease without CHF 07/31/2011  . ICD (implantable cardiac defibrillator) in place   . ICD dual chamber in situ   . Metabolic syndrome   . Morbid obesity ([place])   . Osteoarthritis   . S/P CABG (coronary artery bypass graft) 11/02/2000  . Sleep apnea     ROS:   All systems reviewed and negative except as noted in the HPI.   Past Surgical History:  Procedure Laterality Date  . ABDOMINAL EXPLORATION SURGERY    . BACK SURGERY     X'3  . cardiac bypass    . CARDIAC DEFIBRILLATOR PLACEMENT    . CARPAL TUNNEL RELEASE     X2, bilateral  . CATARACT EXTRACTION    . COLONOSCOPY  06/20/2012   Procedure: COLONOSCOPY;  Surgeon: Sable Feil, MD;  Location: [facility] ENDOSCOPY;  Service: Endoscopy;  Laterality: N/A;  . DOPPLER ECHOCARDIOGRAPHY  2003  . ESOPHAGOGASTRODUODENOSCOPY  06/20/2012   Procedure: ESOPHAGOGASTRODUODENOSCOPY (EGD);  Surgeon: Sable Feil, MD;  Location: [facility];  Service: Endoscopy;  Laterality: N/A;  . EYE SURGERY    . LAPAROTOMY    . RETINOPATHY SURGERY Bilateral   . rotator cuff surgery     left     Family History  Problem Relation Age of Onset  . Diabetes Brother   . Heart disease Father   . Heart disease Mother   . Diabetes Mother   . Diabetes Brother   . Diabetes Brother   . Throat cancer Paternal Uncle      Social History   Social History  . Marital status: Married    Spouse name: N/A  . Number of children: 4  . Years of education: N/A   Occupational History  . Retired  from [employer] Retired   Social History Main Topics  . Smoking status: Never Smoker  . Smokeless tobacco: Never Used  . Alcohol use No  . Drug use: No  . Sexual activity: Yes   Other Topics Concern  . Not on file   Social History Narrative   Right handed, Married, 4 kids from previous marriage.  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 Caffeine 1 cup daily.     BP (!) 116/42   Pulse 60   Ht 5\' 4"  (1.626 m)   Wt 242 lb (109.8 kg)   BMI 41.54 kg/m   Physical Exam:  obese appearing 82 year old man, NAD HEENT: Unremarkable Neck:  7 cm  JVD, no thyromegally Lymphatics:  No adenopathy Back:  No CVA tenderness Lungs:  Clear with no wheezes HEART:  Regular rate rhythm, no murmurs, no rubs, no clicks Abd:  soft,  obese, positive bowel sounds, no organomegally, no rebound, no guarding Ext:  2 plus pulses, no edema, no cyanosis, no clubbing Skin:  No rashes no nodules Neuro:  CN II through XII intact, motor grossly intact  DEVICE  Normal device function.  See PaceArt for details.   Assess/Plan:  1. VT - he has had no ventricular arrhythmias. Will continue his current meds. 2. Chronic systolic heart failure - his symptoms are class 2. Will follow. He is encouraged to reduce his sodium intake. 3. ICD - His medtronic DDD ICD is working normally. He is at ERI voltage. Will schedule for ICD generator change out.  [name]

## 2016-06-24 LAB — CUP PACEART INCLINIC DEVICE CHECK
Battery Voltage: 2.62 V
Brady Statistic AP VP Percent: 0 %
Brady Statistic AP VS Percent: 63.56 %
Brady Statistic AS VP Percent: 0.07 %
Brady Statistic AS VS Percent: 36.37 %
Brady Statistic RA Percent Paced: 63.56 %
Brady Statistic RV Percent Paced: 0.07 %
Date Time Interrogation Session: 20171024180107
HighPow Impedance: 48 Ohm
HighPow Impedance: 63 Ohm
Implantable Lead Implant Date: 20030519
Implantable Lead Implant Date: 20030519
Implantable Lead Location: 753859
Implantable Lead Location: 753860
Implantable Lead Model: 158
Implantable Lead Model: 4087
Implantable Lead Serial Number: 115102
Implantable Lead Serial Number: 159999
Lead Channel Impedance Value: 4047 Ohm
Lead Channel Impedance Value: 4047 Ohm
Lead Channel Impedance Value: 4047 Ohm
Lead Channel Impedance Value: 437 Ohm
Lead Channel Impedance Value: 437 Ohm
Lead Channel Pacing Threshold Amplitude: 1 V
Lead Channel Pacing Threshold Amplitude: 1.25 V
Lead Channel Pacing Threshold Pulse Width: 0.4 ms
Lead Channel Pacing Threshold Pulse Width: 0.7 ms
Lead Channel Sensing Intrinsic Amplitude: 15.5 mV
Lead Channel Sensing Intrinsic Amplitude: 3.25 mV
Lead Channel Setting Pacing Amplitude: 2.5 V
Lead Channel Setting Pacing Amplitude: 5 V
Lead Channel Setting Pacing Pulse Width: 0.4 ms
Lead Channel Setting Sensing Sensitivity: 0.3 mV

## 2016-06-26 ENCOUNTER — Ambulatory Visit: Payer: Medicare Other | Admitting: Podiatry

## 2016-06-29 DIAGNOSIS — S52532D Colles' fracture of left radius, subsequent encounter for closed fracture with routine healing: Secondary | ICD-10-CM | POA: Diagnosis not present

## 2016-07-03 ENCOUNTER — Other Ambulatory Visit: Payer: Medicare Other | Admitting: *Deleted

## 2016-07-03 DIAGNOSIS — Z01812 Encounter for preprocedural laboratory examination: Secondary | ICD-10-CM

## 2016-07-03 DIAGNOSIS — Z79899 Other long term (current) drug therapy: Secondary | ICD-10-CM | POA: Diagnosis not present

## 2016-07-03 LAB — CBC WITH DIFFERENTIAL/PLATELET
Basophils Absolute: 0 cells/uL (ref 0–200)
Basophils Relative: 0 %
Eosinophils Absolute: 140 cells/uL (ref 15–500)
Eosinophils Relative: 2 %
HCT: 37.3 % — ABNORMAL LOW (ref 38.5–50.0)
Hemoglobin: 13.1 g/dL — ABNORMAL LOW (ref 13.2–17.1)
Lymphocytes Relative: 40 %
Lymphs Abs: 2800 cells/uL (ref 850–3900)
MCH: 30.3 pg (ref 27.0–33.0)
MCHC: 35.1 g/dL (ref 32.0–36.0)
MCV: 86.1 fL (ref 80.0–100.0)
MPV: 9.7 fL (ref 7.5–12.5)
Monocytes Absolute: 420 cells/uL (ref 200–950)
Monocytes Relative: 6 %
Neutro Abs: 3640 cells/uL (ref 1500–7800)
Neutrophils Relative %: 52 %
Platelets: 168 10*3/uL (ref 140–400)
RBC: 4.33 MIL/uL (ref 4.20–5.80)
RDW: 14.7 % (ref 11.0–15.0)
WBC: 7 10*3/uL (ref 3.8–10.8)

## 2016-07-03 LAB — BASIC METABOLIC PANEL
BUN: 28 mg/dL — ABNORMAL HIGH (ref 7–25)
CALCIUM: 8.9 mg/dL (ref 8.6–10.3)
CO2: 22 mmol/L (ref 20–31)
CREATININE: 2.04 mg/dL — AB (ref 0.70–1.11)
Chloride: 105 mmol/L (ref 98–110)
Glucose, Bld: 129 mg/dL — ABNORMAL HIGH (ref 65–99)
Potassium: 4.1 mmol/L (ref 3.5–5.3)
Sodium: 140 mmol/L (ref 135–146)

## 2016-07-13 DIAGNOSIS — S52532D Colles' fracture of left radius, subsequent encounter for closed fracture with routine healing: Secondary | ICD-10-CM | POA: Diagnosis not present

## 2016-07-14 ENCOUNTER — Ambulatory Visit (HOSPITAL_COMMUNITY)
Admission: RE | Admit: 2016-07-14 | Discharge: 2016-07-14 | Disposition: A | Discharge status: Home / Self Care | Payer: Medicare Other | Source: Ambulatory Visit | Attending: Internal Medicine | Admitting: Internal Medicine

## 2016-07-14 ENCOUNTER — Encounter (HOSPITAL_COMMUNITY): Payer: Self-pay | Admitting: Internal Medicine

## 2016-07-14 ENCOUNTER — Encounter (HOSPITAL_COMMUNITY)
Admission: RE | Disposition: A | Discharge status: Home / Self Care | Payer: Self-pay | Source: Ambulatory Visit | Attending: Internal Medicine

## 2016-07-14 DIAGNOSIS — Z79899 Other long term (current) drug therapy: Secondary | ICD-10-CM | POA: Diagnosis not present

## 2016-07-14 DIAGNOSIS — I4729 Other ventricular tachycardia: Secondary | ICD-10-CM

## 2016-07-14 DIAGNOSIS — I5022 Chronic systolic (congestive) heart failure: Secondary | ICD-10-CM | POA: Insufficient documentation

## 2016-07-14 DIAGNOSIS — Z006 Encounter for examination for normal comparison and control in clinical research program: Secondary | ICD-10-CM | POA: Diagnosis not present

## 2016-07-14 DIAGNOSIS — I255 Ischemic cardiomyopathy: Secondary | ICD-10-CM | POA: Insufficient documentation

## 2016-07-14 DIAGNOSIS — I472 Ventricular tachycardia, unspecified: Secondary | ICD-10-CM

## 2016-07-14 DIAGNOSIS — Z4502 Encounter for adjustment and management of automatic implantable cardiac defibrillator: Secondary | ICD-10-CM | POA: Diagnosis not present

## 2016-07-14 DIAGNOSIS — I252 Old myocardial infarction: Secondary | ICD-10-CM | POA: Insufficient documentation

## 2016-07-14 HISTORY — DX: Ventricular tachycardia: I47.2

## 2016-07-14 HISTORY — PX: EP IMPLANTABLE DEVICE: SHX172B

## 2016-07-14 HISTORY — DX: Other ventricular tachycardia: I47.29

## 2016-07-14 HISTORY — DX: Ventricular tachycardia, unspecified: I47.20

## 2016-07-14 LAB — SURGICAL PCR SCREEN
MRSA, PCR: NEGATIVE
Staphylococcus aureus: NEGATIVE

## 2016-07-14 LAB — GLUCOSE, CAPILLARY
GLUCOSE-CAPILLARY: 137 mg/dL — AB (ref 65–99)
Glucose-Capillary: 130 mg/dL — ABNORMAL HIGH (ref 65–99)

## 2016-07-14 SURGERY — ICD/BIV ICD GENERATOR CHANGEOUT

## 2016-07-14 MED ORDER — GENTAMICIN SULFATE 40 MG/ML IJ SOLN
INTRAMUSCULAR | Status: AC
  Filled 2016-07-14: qty 2

## 2016-07-14 MED ORDER — MIDAZOLAM HCL 5 MG/5ML IJ SOLN
INTRAMUSCULAR | Status: AC
  Filled 2016-07-14: qty 5

## 2016-07-14 MED ORDER — IOPAMIDOL (ISOVUE-370) INJECTION 76%
INTRAVENOUS | Status: AC
  Filled 2016-07-14: qty 50

## 2016-07-14 MED ORDER — CEFAZOLIN SODIUM-DEXTROSE 2-4 GM/100ML-% IV SOLN
2.0000 g | INTRAVENOUS | Status: AC
  Administered 2016-07-14: 2 g via INTRAVENOUS

## 2016-07-14 MED ORDER — FENTANYL CITRATE (PF) 100 MCG/2ML IJ SOLN
INTRAMUSCULAR | Status: DC | PRN
  Administered 2016-07-14: 25 ug via INTRAVENOUS

## 2016-07-14 MED ORDER — LIDOCAINE HCL (PF) 1 % IJ SOLN
INTRAMUSCULAR | Status: AC
  Filled 2016-07-14: qty 60

## 2016-07-14 MED ORDER — ACETAMINOPHEN 325 MG PO TABS: 325.0000 mg | ORAL_TABLET | ORAL | Status: DC | PRN

## 2016-07-14 MED ORDER — SODIUM CHLORIDE 0.9 % IV SOLN
INTRAVENOUS | Status: DC
  Administered 2016-07-14: 09:00:00 via INTRAVENOUS

## 2016-07-14 MED ORDER — FENTANYL CITRATE (PF) 100 MCG/2ML IJ SOLN
INTRAMUSCULAR | Status: AC
  Filled 2016-07-14: qty 2

## 2016-07-14 MED ORDER — MUPIROCIN 2 % EX OINT
1.0000 "application " | TOPICAL_OINTMENT | Freq: Once | CUTANEOUS | Status: AC
  Administered 2016-07-14: 1 via TOPICAL

## 2016-07-14 MED ORDER — HEPARIN (PORCINE) IN NACL 2-0.9 UNIT/ML-% IJ SOLN
INTRAMUSCULAR | Status: DC | PRN
  Administered 2016-07-14: 500 mL

## 2016-07-14 MED ORDER — SODIUM CHLORIDE 0.9 % IR SOLN
80.0000 mg | Status: AC
  Administered 2016-07-14: 80 mg

## 2016-07-14 MED ORDER — ONDANSETRON HCL 4 MG/2ML IJ SOLN: 4.0000 mg | Freq: Four times a day (QID) | INTRAMUSCULAR | Status: DC | PRN

## 2016-07-14 MED ORDER — CEFAZOLIN SODIUM-DEXTROSE 2-4 GM/100ML-% IV SOLN
INTRAVENOUS | Status: AC
  Filled 2016-07-14: qty 100

## 2016-07-14 MED ORDER — MUPIROCIN 2 % EX OINT
TOPICAL_OINTMENT | CUTANEOUS | Status: AC
  Administered 2016-07-14: 1 via TOPICAL
  Filled 2016-07-14: qty 22

## 2016-07-14 MED ORDER — LIDOCAINE HCL (PF) 1 % IJ SOLN
INTRAMUSCULAR | Status: DC | PRN
  Administered 2016-07-14: 40 mL

## 2016-07-14 MED ORDER — HEPARIN (PORCINE) IN NACL 2-0.9 UNIT/ML-% IJ SOLN
INTRAMUSCULAR | Status: AC
  Filled 2016-07-14: qty 500

## 2016-07-14 MED ORDER — IOPAMIDOL (ISOVUE-370) INJECTION 76%
INTRAVENOUS | Status: DC | PRN
  Administered 2016-07-14: 10 mL via INTRAVENOUS

## 2016-07-14 MED ORDER — CHLORHEXIDINE GLUCONATE 4 % EX LIQD
60.0000 mL | Freq: Once | CUTANEOUS | Status: DC
  Filled 2016-07-14: qty 60

## 2016-07-14 MED ORDER — MIDAZOLAM HCL 5 MG/5ML IJ SOLN
INTRAMUSCULAR | Status: DC | PRN
  Administered 2016-07-14: 2 mg via INTRAVENOUS

## 2016-07-14 SURGICAL SUPPLY — 4 items
CABLE SURGICAL S-101-97-12 (CABLE) ×2 IMPLANT
ICD VIVA XT CRT-D DTBA1D1 (ICD Generator) ×2 IMPLANT
PAD DEFIB LIFELINK (PAD) ×2 IMPLANT
TRAY PACEMAKER INSERTION (PACKS) ×2 IMPLANT

## 2016-07-14 NOTE — Discharge Instructions (Signed)
Pacemaker Battery Change, Care After °Refer to this sheet in the next few weeks. These instructions provide you with information on caring for yourself after your procedure. Your health care provider may also give you more specific instructions. Your treatment has been planned according to current medical practices, but problems sometimes occur. Call your health care provider if you have any problems or questions after your procedure. °WHAT TO EXPECT AFTER THE PROCEDURE °After your procedure, it is typical to have the following sensations: °· Soreness at the pacemaker site. °HOME CARE INSTRUCTIONS  °· Keep the incision clean and dry. °· Unless advised otherwise, you may shower beginning 48 hours after your procedure. °· For the first week after the replacement, avoid stretching motions that pull at the incision site, and avoid heavy exercise with the arm that is on the same side as the incision. °· Take medicines only as directed by your health care provider. °· Keep all follow-up visits as directed by your health care provider. °SEEK MEDICAL CARE IF:  °· You have pain at the incision site that is not relieved by over-the-counter or prescription medicine. °· There is drainage or pus from the incision site. °· There is swelling larger than a lime at the incision site. °· You develop red streaking that extends above or below the incision site. °· You feel brief, intermittent palpitations, light-headedness, or any symptoms that you feel might be related to your heart. °SEEK IMMEDIATE MEDICAL CARE IF:  °· You experience chest pain that is different than the pain at the pacemaker site. °· You experience shortness of breath. °· You have palpitations or irregular heartbeat. °· You have light-headedness that does not go away quickly. °· You faint. °· You have pain that gets worse and is not relieved by medicine. °This information is not intended to replace advice given to you by your health care provider. Make sure you  discuss any questions you have with your health care provider. °Document Released: 06/07/2013 Document Revised: 09/07/2014 Document Reviewed: 06/07/2013 °Elsevier Interactive Patient Education © 2017 Else[employer] Inc. ° °

## 2016-07-15 ENCOUNTER — Telehealth: Payer: Self-pay | Admitting: Pediatrics

## 2016-07-15 MED FILL — Sodium Chloride Irrigation Soln 0.9%: Qty: 500 | Status: AC

## 2016-07-15 MED FILL — Gentamicin Sulfate Inj 40 MG/ML: INTRAMUSCULAR | Qty: 2 | Status: AC

## 2016-07-15 MED FILL — Cefazolin Sodium-Dextrose IV Solution 2 GM/100ML-4%: INTRAVENOUS | Qty: 100 | Status: AC

## 2016-07-16 ENCOUNTER — Other Ambulatory Visit: Payer: Self-pay | Admitting: *Deleted

## 2016-07-16 MED ORDER — GLUCOSE BLOOD VI STRP: ORAL_STRIP | 12 refills | Status: DC

## 2016-07-16 MED ORDER — ACCU-CHEK NANO SMARTVIEW W/DEVICE KIT: PACK | 0 refills | Status: DC

## 2016-07-16 NOTE — H&P (Signed)
ICD Criteria  Current LVEF:unknown%. Within 12 months prior to implant: No   Heart failure history: Yes, Class III  Cardiomyopathy history: Yes, Ischemic Cardiomyopathy - Prior MI.  Atrial Fibrillation/Atrial Flutter: No.  Ventricular tachycardia history: Yes, Hemodynamic instability present. VT Type: Sustained Ventricular Tachycardia - Monomorphic.  Cardiac arrest history: No.  History of syndromes with risk of sudden death: No.  Previous ICD: Yes, Reason for ICD:  Secondary prevention.  Current ICD indication: Secondary  PPM indication: No.   Class I or II Bradycardia indication present: No  Beta Blocker therapy for 3 or more months: Yes, prescribed.   Ace Inhibitor/ARB therapy for 3 or more months: Yes, prescribed.

## 2016-07-16 NOTE — Telephone Encounter (Signed)
done

## 2016-07-17 ENCOUNTER — Telehealth: Payer: Self-pay | Admitting: Cardiology

## 2016-07-17 NOTE — Telephone Encounter (Signed)
Pt called and stated that he heard his alert tone on his ICD alarm. Instructed pt to send a remote transmission. Transmission received. Device tech will review and call pt back. Pt verbalized understanding.

## 2016-07-17 NOTE — Telephone Encounter (Signed)
Spoke to patient regarding his ICD alert tone. I informed him that the alert was going off bc of the LV impedance warning. Patient does not have an LV lead implanted. I offered patient an appt today to have this alert turned off, but patient declined. I also offered to find patient a sooner appt, but patient states that he's fine to wait until the 27th. I told patient that he will continue to hear this alert tone until he comes into the office. Patient voiced understanding.

## 2016-07-20 DIAGNOSIS — H3562 Retinal hemorrhage, left eye: Secondary | ICD-10-CM | POA: Diagnosis not present

## 2016-07-20 DIAGNOSIS — H3561 Retinal hemorrhage, right eye: Secondary | ICD-10-CM | POA: Diagnosis not present

## 2016-07-20 DIAGNOSIS — E113393 Type 2 diabetes mellitus with moderate nonproliferative diabetic retinopathy without macular edema, bilateral: Secondary | ICD-10-CM | POA: Diagnosis not present

## 2016-07-20 DIAGNOSIS — H35041 Retinal micro-aneurysms, unspecified, right eye: Secondary | ICD-10-CM | POA: Diagnosis not present

## 2016-07-21 ENCOUNTER — Other Ambulatory Visit: Payer: Self-pay | Admitting: Internal Medicine

## 2016-07-26 ENCOUNTER — Other Ambulatory Visit: Payer: Self-pay | Admitting: Pediatrics

## 2016-07-27 ENCOUNTER — Ambulatory Visit (INDEPENDENT_AMBULATORY_CARE_PROVIDER_SITE_OTHER): Payer: Medicare Other | Admitting: *Deleted

## 2016-07-27 DIAGNOSIS — Z95810 Presence of automatic (implantable) cardiac defibrillator: Secondary | ICD-10-CM | POA: Diagnosis not present

## 2016-07-27 DIAGNOSIS — S52532D Colles' fracture of left radius, subsequent encounter for closed fracture with routine healing: Secondary | ICD-10-CM | POA: Diagnosis not present

## 2016-07-27 NOTE — Progress Notes (Signed)
Wound check appointment. Steri-strips removed. Wound without redness or edema. Incision edges approximated, wound well healed. Normal device function. Thresholds, sensing, and impedances consistent with implant measurements. Device programmed at chronic lead outputs with adaptive on. Histogram distribution appropriate for patient and level of activity. No mode switches or ventricular arrhythmias noted. Patient educated about wound care, arm mobility, shock plan. ROV in with GT 10/14/2016.

## 2016-07-31 ENCOUNTER — Other Ambulatory Visit: Payer: Self-pay | Admitting: Pharmacist

## 2016-07-31 ENCOUNTER — Other Ambulatory Visit: Payer: Self-pay | Admitting: Family Medicine

## 2016-08-03 ENCOUNTER — Encounter: Payer: Self-pay | Admitting: Family

## 2016-08-03 ENCOUNTER — Ambulatory Visit (INDEPENDENT_AMBULATORY_CARE_PROVIDER_SITE_OTHER): Payer: Medicare Other | Admitting: Family

## 2016-08-03 DIAGNOSIS — J209 Acute bronchitis, unspecified: Secondary | ICD-10-CM

## 2016-08-03 MED ORDER — FLUTICASONE PROPIONATE 50 MCG/ACT NA SUSP: 1.0000 | Freq: Two times a day (BID) | NASAL | 6 refills | Status: DC | PRN

## 2016-08-03 MED ORDER — AZITHROMYCIN 250 MG PO TABS: ORAL_TABLET | ORAL | 0 refills | Status: DC

## 2016-08-03 NOTE — Patient Instructions (Signed)

## 2016-08-03 NOTE — Progress Notes (Signed)
Subjective:    Patient ID: Manuel Kline, male    DOB: Oct 16, 1931, 82 y.o.   MRN: [id number]  Cough  This is a new problem. The current episode started in the past 7 days. The problem has been unchanged. The cough is productive of brown sputum and productive of purulent sputum. Associated symptoms include myalgias, nasal congestion, postnasal drip, rhinorrhea, a sore throat, shortness of breath, sweats and wheezing. Pertinent negatives include no chills, ear congestion, ear pain, fever or headaches. The symptoms are aggravated by lying down. He has tried rest and OTC cough suppressant for the symptoms. The treatment provided mild relief.      Review of Systems  Constitutional: Negative for chills and fever.  HENT: Positive for postnasal drip, rhinorrhea and sore throat. Negative for ear pain.   Respiratory: Positive for cough, shortness of breath and wheezing.   Musculoskeletal: Positive for myalgias.  Neurological: Negative for headaches.  All other systems reviewed and are negative.      Objective:   Physical Exam  Constitutional: He is oriented to person, place, and time. He appears well-developed and well-nourished. No distress.  HENT:  Head: Normocephalic.  Right Ear: External ear normal.  Left Ear: External ear normal.  Nose: Mucosal edema and rhinorrhea present.  Mouth/Throat: Posterior oropharyngeal erythema present.  Eyes: Pupils are equal, round, and reactive to light. Right eye exhibits no discharge. Left eye exhibits no discharge.  Neck: Normal range of motion. Neck supple. No thyromegaly present.  Cardiovascular: Normal rate, regular rhythm, normal heart sounds and intact distal pulses.   No murmur heard. Pulmonary/Chest: Effort normal. No respiratory distress. He has decreased breath sounds. He has no wheezes.  Abdominal: Soft. Bowel sounds are normal. He exhibits no distension. There is no tenderness.  Musculoskeletal: Normal range of motion. He exhibits no edema or  tenderness.  Neurological: He is alert and oriented to person, place, and time. He has normal reflexes. No cranial nerve deficit.  Skin: Skin is warm and dry. No rash noted. No erythema.  Psychiatric: He has a normal mood and affect. His behavior is normal. Judgment and thought content normal.  Vitals reviewed.     BP (!) 160/71   Pulse 60   Temp (!) 96.8 F (36 C) (Oral)   Ht 5' 4.5" (1.638 m)   Wt 244 lb (110.7 kg)   BMI 41.24 kg/m      Assessment & Plan:  1. Acute bronchitis, unspecified organism -- Take meds as prescribed - Use a cool mist humidifier  -Use saline nose sprays frequently -Saline irrigations of the nose can be very helpful if done frequently.  * 4X daily for 1 week*  * Use of a nettie pot can be helpful with this. Follow directions with this* -Force fluids -For any cough or congestion  Use plain Mucinex- regular strength or max strength is fine   * Children- consult with Pharmacist for dosing -For fever or aces or pains- take tylenol or ibuprofen appropriate for age and weight.  * for fevers greater than 101 orally you may alternate ibuprofen and tylenol every  3 hours. -Throat lozenges if help -New toothbrush in 3 days -Stop decongestant!! - azithromycin (ZITHROMAX) 250 MG tablet; Take 500 mg once, then 250 mg for four days  Dispense: 6 tablet; Refill: 0 - fluticasone (FLONASE) 50 MCG/ACT nasal spray; Place 1 spray into both nostrils 2 (two) times daily as needed for allergies or rhinitis.  Dispense: 16 g; Refill: [place],  FNP  

## 2016-08-11 DIAGNOSIS — N184 Chronic kidney disease, stage 4 (severe): Secondary | ICD-10-CM | POA: Diagnosis not present

## 2016-08-20 DIAGNOSIS — S52532D Colles' fracture of left radius, subsequent encounter for closed fracture with routine healing: Secondary | ICD-10-CM | POA: Diagnosis not present

## 2016-09-07 ENCOUNTER — Other Ambulatory Visit: Payer: Self-pay | Admitting: Pharmacist

## 2016-09-07 DIAGNOSIS — I2510 Atherosclerotic heart disease of native coronary artery without angina pectoris: Secondary | ICD-10-CM | POA: Diagnosis not present

## 2016-09-07 DIAGNOSIS — I5022 Chronic systolic (congestive) heart failure: Secondary | ICD-10-CM | POA: Diagnosis not present

## 2016-09-07 DIAGNOSIS — I255 Ischemic cardiomyopathy: Secondary | ICD-10-CM | POA: Diagnosis not present

## 2016-09-10 DIAGNOSIS — E113391 Type 2 diabetes mellitus with moderate nonproliferative diabetic retinopathy without macular edema, right eye: Secondary | ICD-10-CM | POA: Diagnosis not present

## 2016-09-10 DIAGNOSIS — H40013 Open angle with borderline findings, low risk, bilateral: Secondary | ICD-10-CM | POA: Diagnosis not present

## 2016-09-10 DIAGNOSIS — Z961 Presence of intraocular lens: Secondary | ICD-10-CM | POA: Diagnosis not present

## 2016-09-10 DIAGNOSIS — E113392 Type 2 diabetes mellitus with moderate nonproliferative diabetic retinopathy without macular edema, left eye: Secondary | ICD-10-CM | POA: Diagnosis not present

## 2016-09-10 LAB — HM DIABETES EYE EXAM

## 2016-09-18 ENCOUNTER — Encounter: Payer: Self-pay | Admitting: Pediatrics

## 2016-09-18 ENCOUNTER — Ambulatory Visit (INDEPENDENT_AMBULATORY_CARE_PROVIDER_SITE_OTHER): Payer: Medicare Other | Admitting: Pediatrics

## 2016-09-18 VITALS — BP 139/67 | HR 63 | Temp 97.8°F | Resp 22 | Ht 64.5 in | Wt 245.6 lb

## 2016-09-18 DIAGNOSIS — J209 Acute bronchitis, unspecified: Secondary | ICD-10-CM | POA: Diagnosis not present

## 2016-09-18 DIAGNOSIS — J069 Acute upper respiratory infection, unspecified: Secondary | ICD-10-CM | POA: Diagnosis not present

## 2016-09-18 MED ORDER — FLUTICASONE PROPIONATE 50 MCG/ACT NA SUSP: 1.0000 | Freq: Two times a day (BID) | NASAL | 6 refills | Status: DC | PRN

## 2016-09-18 MED ORDER — AZITHROMYCIN 250 MG PO TABS: ORAL_TABLET | ORAL | 0 refills | Status: DC

## 2016-09-18 NOTE — Progress Notes (Signed)
  Subjective:   Patient ID: DJANGO NGUYEN, male    DOB: [birth year], 83 y.o.   MRN: [id number] CC: Cough and Chest congestion  HPI: Manuel Kline is a 83 y.o. male presenting for Cough and Chest congestion  Started getting sick two days ago Coughing a lot Felt slightly nauseous this morning No flu contacts Congestion and coughing bothering him the most No fevers  Relevant past medical, surgical, family and social history reviewed. Allergies and medications reviewed and updated. History  Smoking Status  . Never Smoker  Smokeless Tobacco  . Never Used   ROS: Per HPI   Objective:    BP 139/67   Pulse 63   Temp 97.8 F (36.6 C) (Oral)   Resp (!) 22   Ht 5' 4.5" (1.638 m)   Wt 245 lb 9.6 oz (111.4 kg)   SpO2 98%   BMI 41.51 kg/m   Wt Readings from Last 3 Encounters:  09/18/16 245 lb 9.6 oz (111.4 kg)  08/03/16 244 lb (110.7 kg)  07/14/16 240 lb (108.9 kg)    Gen: NAD, alert, cooperative with exam, NCAT, congested EYES: EOMI, no conjunctival injection, or no icterus ENT:  TMs dull gray b/l with clear effusion, OP without erythema LYMPH: no cervical LAD CV: NRRR, normal S1/S2, no murmur, distal pulses 2+ b/l Resp: CTABL, no wheezes, normal WOB Abd: +BS, soft, NTND. no guarding or organomegaly Ext: No edema, warm Neuro: Alert and oriented  Assessment & Plan:  Michial was seen today for cough and chest congestion.  Diagnoses and all orders for this visit:  Acute bronchitis, unspecified organism Acute uri Discussed symptoms most likely viral Antibiotics will not help now Discussed symptomatic care If not improving after the weekend and still coughing can try azithromycin If any worsening of symptoms should be seen -     azithromycin (ZITHROMAX) 250 MG tablet; Take 2 the first day and then one each day after. -     fluticasone (FLONASE) 50 MCG/ACT nasal spray; Place 1 spray into both nostrils 2 (two) times daily as needed for allergies or rhinitis.  Follow up plan: As  scheduled Assunta Found, MD [place]

## 2016-09-18 NOTE — Patient Instructions (Signed)
Start azithromycin if not improving by Monday Use flonase daily, take tylenol as needed

## 2016-09-23 ENCOUNTER — Encounter: Payer: Self-pay | Admitting: Pediatrics

## 2016-09-23 ENCOUNTER — Ambulatory Visit (INDEPENDENT_AMBULATORY_CARE_PROVIDER_SITE_OTHER): Payer: Medicare Other | Admitting: Pediatrics

## 2016-09-23 VITALS — BP 119/67 | HR 60 | Temp 96.8°F | Resp 22 | Ht 64.5 in | Wt 245.6 lb

## 2016-09-23 DIAGNOSIS — E1165 Type 2 diabetes mellitus with hyperglycemia: Secondary | ICD-10-CM | POA: Diagnosis not present

## 2016-09-23 DIAGNOSIS — E1140 Type 2 diabetes mellitus with diabetic neuropathy, unspecified: Secondary | ICD-10-CM

## 2016-09-23 DIAGNOSIS — I10 Essential (primary) hypertension: Secondary | ICD-10-CM | POA: Diagnosis not present

## 2016-09-23 DIAGNOSIS — IMO0002 Reserved for concepts with insufficient information to code with codable children: Secondary | ICD-10-CM

## 2016-09-23 DIAGNOSIS — R062 Wheezing: Secondary | ICD-10-CM

## 2016-09-23 DIAGNOSIS — E785 Hyperlipidemia, unspecified: Secondary | ICD-10-CM

## 2016-09-23 LAB — BMP8+EGFR
BUN/Creatinine Ratio: 17 (ref 10–24)
BUN: 43 mg/dL — ABNORMAL HIGH (ref 8–27)
CO2: 23 mmol/L (ref 18–29)
Calcium: 8.6 mg/dL (ref 8.6–10.2)
Chloride: 99 mmol/L (ref 96–106)
Creatinine, Ser: 2.47 mg/dL — ABNORMAL HIGH (ref 0.76–1.27)
GFR calc Af Amer: 27 mL/min/{1.73_m2} — ABNORMAL LOW (ref >59)
GFR, EST NON AFRICAN AMERICAN: 23 mL/min/{1.73_m2} — AB (ref >59)
Glucose: 105 mg/dL — ABNORMAL HIGH (ref 65–99)
POTASSIUM: 4.3 mmol/L (ref 3.5–5.2)
SODIUM: 140 mmol/L (ref 134–144)

## 2016-09-23 LAB — BAYER DCA HB A1C WAIVED: HB A1C (BAYER DCA - WAIVED): 10.9 % — ABNORMAL HIGH (ref <7.0)

## 2016-09-23 MED ORDER — SPACER/AERO CHAMBER MOUTHPIECE MISC: 1.0000 | Freq: Four times a day (QID) | 0 refills | Status: DC | PRN

## 2016-09-23 MED ORDER — ALBUTEROL SULFATE HFA 108 (90 BASE) MCG/ACT IN AERS: 2.0000 | INHALATION_SPRAY | Freq: Four times a day (QID) | RESPIRATORY_TRACT | 2 refills | Status: DC | PRN

## 2016-09-23 NOTE — Patient Instructions (Signed)
Diabetes:  Check blood sugar level first thing in the morning before insulin and at night before insulin Bring numbers to next clinic visit  Coughing and wheezing: Use albuterol inhaler, 2 puffs three times a day for the next few days After 3 days you can use the inhaler if you need it, but dont have to use it unless you are wheezing If wheezing is not getting better over next week, let me know  Diabetic foot care: Call to set up podiatry appointment with your preferred provider If you have trouble making that appointment, let our office know

## 2016-09-23 NOTE — Progress Notes (Signed)
  Subjective:   Patient ID: Manuel Kline, male    DOB: 1932-01-30, 83 y.o.   MRN: [id number] CC: Follow-up (3 month); Shortness of Breath; and Wheezing  HPI: Manuel Kline is a 83 y.o. male presenting for Follow-up (3 month); Shortness of Breath; and Wheezing  DM2: Tries to avoid sweets Taking 40u humulin N BID 30u novolin R BID BGLs in the morning in upper 100s at times 80 this morning, often low 100s in the morning  Acute URI: Seen last week with coughing, wheezing Has had problems in the winter with breathing for years In the summer breahting is easier Was exposed to a lot of PVC glue in work over years    Relevant past medical, surgical, family and social history reviewed. Allergies and medications reviewed and updated. History  Smoking Status  . Never Smoker  Smokeless Tobacco  . Never Used   ROS: Per HPI   Objective:    BP 119/67   Pulse 60   Temp (!) 96.8 F (36 C) (Oral)   Resp (!) 22   Ht 5' 4.5" (1.638 m)   Wt 245 lb 9.6 oz (111.4 kg)   SpO2 97%   BMI 41.51 kg/m   Wt Readings from Last 3 Encounters:  09/23/16 245 lb 9.6 oz (111.4 kg)  09/18/16 245 lb 9.6 oz (111.4 kg)  08/03/16 244 lb (110.7 kg)    Gen: NAD, alert, cooperative with exam, NCAT EYES: EOMI, no conjunctival injection, or no icterus ENT:  TMs pearly gray b/l, OP without erythema LYMPH: no cervical LAD CV: NRRR, normal S1/S2, no murmur, distal pulses 2+ b/l Resp: faint exp wheezes end expiration b/l, normal WOB Abd: +BS, soft, NTND. no guarding or organomegaly Ext: No edema, warm Neuro: Alert and oriented, decreased sensation to monofilament and light touch b/l Skin:  L toe nail missing, no redness over toe, R toenail partially missing, no TTP, no redness or signs of infection  Assessment & Plan:  Shelden was seen today for follow-up and wheezing.  Diagnoses and all orders for this visit:  Type 2 diabetes, uncontrolled, with neuropathy ([place]) Taking insulin twice a day A1c up to 10.9,  previously in 7s AM BGLs per pt in low 100s Take BGLs AM and PM before insulin every day, bring to clinic in one month If regularly in 200s let me know Avoid sugary foods -     BMP8+EGFR -     Bayer DCA Hb A1c Waived  Essential hypertension Well controlled today, cont meds -     BMP8+EGFR  Hyperlipidemia, unspecified hyperlipidemia type Stable, cont med -     BMP8+EGFR  Wheezing Recent URI Faint wheezing end of expiration Do not think needs steroids at this time, would like to avoid with DM2 uncontrolled Does not have albuterol at home, trial albuterol Let me know if not improving SOB much improved from last visit for URI Use albuterol TID for next few days, then only if needed Use with spacer -     albuterol (PROVENTIL HFA;VENTOLIN HFA) 108 (90 Base) MCG/ACT inhaler; Inhale 2 puffs into the lungs every 6 (six) hours as needed for wheezing or shortness of breath.   Follow up plan: Return for Tammy in 1 mo, Dr. Evette Doffing in 3 months. Assunta Found, MD [place]

## 2016-09-29 ENCOUNTER — Other Ambulatory Visit: Payer: Self-pay | Admitting: Pediatrics

## 2016-10-02 ENCOUNTER — Ambulatory Visit (INDEPENDENT_AMBULATORY_CARE_PROVIDER_SITE_OTHER): Payer: Medicare Other | Admitting: Podiatry

## 2016-10-02 ENCOUNTER — Encounter: Payer: Self-pay | Admitting: Podiatry

## 2016-10-02 DIAGNOSIS — M79609 Pain in unspecified limb: Secondary | ICD-10-CM

## 2016-10-02 DIAGNOSIS — B351 Tinea unguium: Secondary | ICD-10-CM | POA: Diagnosis not present

## 2016-10-02 DIAGNOSIS — E1140 Type 2 diabetes mellitus with diabetic neuropathy, unspecified: Secondary | ICD-10-CM

## 2016-10-03 NOTE — Progress Notes (Signed)
Subjective:     Patient ID: Manuel Kline, male   DOB: [birth year], 83 y.o.   MRN: [id number]  HPI patient presents with incurvated nailbeds 1-5 both feet and history of diabetes and states he cannot cut them and they're painful   Review of Systems     Objective:   Physical Exam Neurovascular status intact negative Homans sign noted with thick yellow brittle nailbeds 1-5 both feet that are painful    Assessment:     Mycotic nail infection with pain 1-5 both feet    Plan:     Debride painful nailbeds 1-5 both feet with no iatrogenic bleeding noted

## 2016-10-09 ENCOUNTER — Encounter: Payer: Self-pay | Admitting: Internal Medicine

## 2016-10-14 ENCOUNTER — Ambulatory Visit (INDEPENDENT_AMBULATORY_CARE_PROVIDER_SITE_OTHER): Payer: Medicare Other | Admitting: Internal Medicine

## 2016-10-14 ENCOUNTER — Encounter: Payer: Self-pay | Admitting: Internal Medicine

## 2016-10-14 VITALS — BP 142/80 | HR 60 | Ht 64.0 in | Wt 247.8 lb

## 2016-10-14 DIAGNOSIS — Z95810 Presence of automatic (implantable) cardiac defibrillator: Secondary | ICD-10-CM

## 2016-10-14 DIAGNOSIS — I5022 Chronic systolic (congestive) heart failure: Secondary | ICD-10-CM | POA: Diagnosis not present

## 2016-10-14 LAB — CUP PACEART INCLINIC DEVICE CHECK
Battery Remaining Longevity: 106 mo
Battery Voltage: 3.14 V
Brady Statistic AP VP Percent: 0.05 %
Brady Statistic AS VS Percent: 29.31 %
Brady Statistic RA Percent Paced: 70.63 %
HIGH POWER IMPEDANCE MEASURED VALUE: 62 Ohm
HighPow Impedance: 47 Ohm
Implantable Lead Location: 753860
Implantable Lead Model: 4087
Implantable Lead Serial Number: 159999
Implantable Pulse Generator Implant Date: 20171114
Lead Channel Impedance Value: 4047 Ohm
Lead Channel Impedance Value: 4047 Ohm
Lead Channel Pacing Threshold Amplitude: 0.75 V
Lead Channel Pacing Threshold Pulse Width: 0.4 ms
Lead Channel Sensing Intrinsic Amplitude: 11 mV
Lead Channel Sensing Intrinsic Amplitude: 13 mV
Lead Channel Sensing Intrinsic Amplitude: 2 mV
Lead Channel Setting Pacing Amplitude: 2.5 V
Lead Channel Setting Pacing Amplitude: 2.75 V
Lead Channel Setting Pacing Pulse Width: 0.4 ms
MDC IDC LEAD IMPLANT DT: 20030519
MDC IDC LEAD IMPLANT DT: 20030519
MDC IDC LEAD LOCATION: 753859
MDC IDC LEAD SERIAL: 115102
MDC IDC MSMT LEADCHNL LV IMPEDANCE VALUE: 4047 Ohm
MDC IDC MSMT LEADCHNL RA IMPEDANCE VALUE: 380 Ohm
MDC IDC MSMT LEADCHNL RA PACING THRESHOLD AMPLITUDE: 1.375 V
MDC IDC MSMT LEADCHNL RA SENSING INTR AMPL: 1.625 mV
MDC IDC MSMT LEADCHNL RV IMPEDANCE VALUE: 380 Ohm
MDC IDC MSMT LEADCHNL RV IMPEDANCE VALUE: 380 Ohm
MDC IDC MSMT LEADCHNL RV PACING THRESHOLD PULSEWIDTH: 0.4 ms
MDC IDC SESS DTM: 20180214235723
MDC IDC SET LEADCHNL RV SENSING SENSITIVITY: 0.3 mV
MDC IDC STAT BRADY AP VS PERCENT: 70.62 %
MDC IDC STAT BRADY AS VP PERCENT: 0.02 %
MDC IDC STAT BRADY RV PERCENT PACED: 0.06 %

## 2016-10-14 NOTE — Progress Notes (Signed)
HPI [name] returns today for followup. He is a very pleasant 83 year old man with an ischemic cardiomyopathy, chronic systolic heart failure, diabetes, morbid obesity, VT, hypertension, status post ICD implantation. In the interim, despite his multiple medical problems, he has remained stable. The patient denies chest pain, shortness of breath, or syncope.  No ICD shocks. He remains active in his garden. He has undergone ICD generator change out. He notes that his weight has fluctuated but he has not felt sob.  No Known Allergies   Current Outpatient Prescriptions  Medication Sig Dispense Refill  . albuterol (PROVENTIL HFA;VENTOLIN HFA) 108 (90 Base) MCG/ACT inhaler Inhale 2 puffs into the lungs every 6 (six) hours as needed for wheezing or shortness of breath. 1 Inhaler 2  . allopurinol (ZYLOPRIM) 300 MG tablet TAKE ONE-HALF TABLET BY MOUTH ONCE DAILY 45 tablet 0  . aspirin 81 MG tablet Take 81 mg by mouth daily.     Marland Kitchen atorvastatin (LIPITOR) 80 MG tablet Take 40 mg by mouth daily.    . cloNIDine (CATAPRES) 0.1 MG tablet Take 1 tablet by mouth 2 (two) times daily.    . fluticasone (FLONASE) 50 MCG/ACT nasal spray Place 1 spray into both nostrils 2 (two) times daily as needed for allergies or rhinitis. 16 g 6  . furosemide (LASIX) 40 MG tablet Take 1 tablet by mouth 2 (two) times daily.    Marland Kitchen gabapentin (NEURONTIN) 300 MG capsule TAKE ONE CAPSULE BY MOUTH TWICE DAILY 60 capsule 2  . glucose blood test strip Use as instructed 100 each 12  . HUMULIN N 100 UNIT/ML injection INJECT 40 UNITS SUBCUTANEOUSLY TWICE DAILY BEFORE MEAL(S) 30 mL 2  . metoprolol succinate (TOPROL-XL) 50 MG 24 hr tablet Take 50 mg by mouth daily.    . Multiple Vitamin (MULTIVITAMIN) tablet Take 1 tablet by mouth daily. Eye formula    . NOVOLIN R RELION 100 UNIT/ML injection INJECT 30 UNITS INTO THE SKIN 3 TIMES DAILY BEFORE MEALS 30 mL 1  . Omega-3 Fatty Acids (FISH OIL) 1000 MG CPDR Take 1 tablet by mouth daily.    Marland Kitchen  omeprazole (PRILOSEC) 20 MG capsule TAKE ONE CAPSULE BY MOUTH TWICE DAILY BEFORE MEAL(S) 60 capsule 5  . quinapril (ACCUPRIL) 40 MG tablet Take 20 mg by mouth at bedtime.    Marland Kitchen Spacer/Aero Chamber Mouthpiece MISC 1 each by Does not apply route every 6 (six) hours as needed. 1 each 0   No current facility-administered medications for this visit.      Past Medical History:  Diagnosis Date  . Adenomatous colon polyp 2006  . CAD (coronary artery disease)   . Calcium oxalate renal stones   . Cardiomyopathy   . Cataract   . Diabetes ([place])   . Erectile dysfunction   . Erosive esophagitis   . Hemorrhoids   . HLD (hyperlipidemia)   . HTN (hypertension)   . Hypertensive heart disease without CHF 07/31/2011  . ICD (implantable cardiac defibrillator) in place   . ICD dual chamber in situ   . Metabolic syndrome   . Morbid obesity ([place])   . Osteoarthritis   . S/P CABG (coronary artery bypass graft) 11/02/2000  . Sleep apnea     ROS:   All systems reviewed and negative except as noted in the HPI.   Past Surgical History:  Procedure Laterality Date  . ABDOMINAL EXPLORATION SURGERY    . BACK SURGERY     X'3  . cardiac bypass    . CARDIAC  DEFIBRILLATOR PLACEMENT    . CARPAL TUNNEL RELEASE     X2, bilateral  . CATARACT EXTRACTION    . COLONOSCOPY  06/20/2012   Procedure: COLONOSCOPY;  Surgeon: Sable Feil, MD;  Location: [facility];  Service: Endoscopy;  Laterality: N/A;  . DOPPLER ECHOCARDIOGRAPHY  2003  . EP IMPLANTABLE DEVICE N/A 07/14/2016   Procedure: ICD Generator Changeout;  Surgeon: Evans Lance, MD;  Location: [facility];  Service: Cardiovascular;  Laterality: N/A;  . ESOPHAGOGASTRODUODENOSCOPY  06/20/2012   Procedure: ESOPHAGOGASTRODUODENOSCOPY (EGD);  Surgeon: Sable Feil, MD;  Location: [facility];  Service: Endoscopy;  Laterality: N/A;  . EYE SURGERY    . LAPAROTOMY    . RETINOPATHY SURGERY Bilateral   . rotator cuff surgery     left      Family History  Problem Relation Age of Onset  . Diabetes Brother   . Heart disease Father   . Heart disease Mother   . Diabetes Mother   . Diabetes Brother   . Diabetes Brother   . Throat cancer Paternal Uncle      Social History   Social History  . Marital status: Married    Spouse name: N/A  . Number of children: 4  . Years of education: N/A   Occupational History  . Retired from [employer] Retired   Social History Main Topics  . Smoking status: Never Smoker  . Smokeless tobacco: Never Used  . Alcohol use No  . Drug use: No  . Sexual activity: Yes   Other Topics Concern  . Not on file   Social History Narrative   Right handed, Married, 4 kids from previous marriage.  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 Caffeine 1 cup daily.     BP (!) 142/80   Pulse 60   Ht 5\' 4"  (1.626 m)   Wt 247 lb 12.8 oz (112.4 kg)   SpO2 98%   BMI 42.53 kg/m   Physical Exam:  obese appearing 83 year old man, NAD HEENT: Unremarkable Neck:  7 cm  JVD, no thyromegally Lymphatics:  No adenopathy Back:  No CVA tenderness Lungs:  Clear with no wheezes HEART:  Regular rate rhythm, no murmurs, no rubs, no clicks Abd:  soft,  obese, positive bowel sounds, no organomegally, no rebound, no guarding Ext:  2 plus pulses, no edema, no cyanosis, no clubbing Skin:  No rashes no nodules Neuro:  CN II through XII intact, motor grossly intact  DEVICE  Normal device function.  See PaceArt for details.   Assess/Plan:  1. VT - he has had no ventricular arrhythmias. Will continue his current meds. 2. Chronic systolic heart failure - his symptoms are class 2. Will follow. He is encouraged to reduce his sodium intake. He admits to ongoing dietary indiscretion. 3. ICD - His medtronic DDD ICD is working normally.  4. CAD - he denies anginal symptoms. He is s/p CABG 2002. No change in his meds. [name]

## 2016-10-14 NOTE — Patient Instructions (Addendum)
Medication Instructions:  Your physician recommends that you continue on your current medications as directed. Please refer to the Current Medication list given to you today.   Labwork: None Ordered   Testing/Procedures: None Ordered   Follow-Up: Your physician wants you to follow-up in: 9 months with Dr. Lovena Le. You will receive a reminder letter in the mail two months in advance. If you don't receive a letter, please call our office to schedule the follow-up appointment.  Remote monitoring is used to monitor your ICD from home. This monitoring reduces the number of office visits required to check your device to one time per year. It allows [place] to keep an eye on the functioning of your device to ensure it is working properly. You are scheduled for a device check from home on 01/13/17. You may send your transmission at any time that day. If you have a wireless device, the transmission will be sent automatically. After your physician reviews your transmission, you will receive a postcard with your next transmission date.     Any Other Special Instructions Will Be Listed Below (If Applicable).     If you need a refill on your cardiac medications before your next appointment, please call your pharmacy.

## 2016-10-16 ENCOUNTER — Telehealth: Payer: Self-pay

## 2016-10-16 ENCOUNTER — Ambulatory Visit (INDEPENDENT_AMBULATORY_CARE_PROVIDER_SITE_OTHER): Payer: Medicare Other | Admitting: *Deleted

## 2016-10-16 DIAGNOSIS — Z95810 Presence of automatic (implantable) cardiac defibrillator: Secondary | ICD-10-CM

## 2016-10-16 NOTE — Progress Notes (Signed)
Pt seen for d/t hearing alert tone. No new alerts or abnormalities noted when device was interrogated. LV impedance alert previously turned off. Pt stated that alert tone was not the sound he had been hearing, pt stated that it sounded like a "ding dong like a door bell." Informed pt that the tone he was hearing was not coming from his device.

## 2016-10-16 NOTE — Telephone Encounter (Signed)
Pt stated that he had an alert tone from his device, asked pt to send in a manual transmission, pt stated that he would. Informed him that he would receive a call back when it was received.

## 2016-10-16 NOTE — Telephone Encounter (Signed)
Called pt and let him know that transmission was received and the alert tone he heard was due to an out of range value that was known, informed pt that he could come into the office to have alert tone turned off, pt agreeable to appointment today at 1030 with device clinic.

## 2016-10-26 ENCOUNTER — Encounter: Payer: Self-pay | Admitting: Pharmacist

## 2016-10-26 ENCOUNTER — Ambulatory Visit (INDEPENDENT_AMBULATORY_CARE_PROVIDER_SITE_OTHER): Payer: Medicare Other | Admitting: Pharmacist

## 2016-10-26 VITALS — BP 136/80 | HR 70 | Ht 64.0 in | Wt 244.0 lb

## 2016-10-26 DIAGNOSIS — N183 Chronic kidney disease, stage 3 unspecified: Secondary | ICD-10-CM

## 2016-10-26 DIAGNOSIS — E1165 Type 2 diabetes mellitus with hyperglycemia: Secondary | ICD-10-CM | POA: Diagnosis not present

## 2016-10-26 DIAGNOSIS — E1140 Type 2 diabetes mellitus with diabetic neuropathy, unspecified: Secondary | ICD-10-CM | POA: Diagnosis not present

## 2016-10-26 DIAGNOSIS — IMO0002 Reserved for concepts with insufficient information to code with codable children: Secondary | ICD-10-CM

## 2016-10-26 NOTE — Progress Notes (Signed)
Subjective:      Manuel Kline is a 83 y.o. male who presents for  reevaluation of Type 2 diabetes mellitus.  The initial diagnosis of diabetes was made over 2 years ago.    His clinical course has fluctuated. Insulin dosage review with <r Lichtman and he reports compliance all the time.   He is currently taking Novolog 30 units prior to each meal units and NPH 40 units bid  Compliance with blood glucose monitoring: good.  The patient does perform independently. Rotation of sites for injection: abdominal wall Exercise: rarely  Diet: eating lots of sweets and nabs for snack each night Beverages is sugar free lemonade, water or black coffee  Blood glucose reading from home: 140, 152, 63, 201, 123, 104, 94, 21, 205, 196, 187, 175, 167, 164, 999 7 day avg = 138 14 day avg = 147 30 day avg = 161 90 day avg = 167  The following portions of the patient's history were reviewed and updated as appropriate: allergies, current medications, past family history, past medical history, past social history, past surgical history and problem list.   Objective:    BP 136/80   Pulse 70   Ht 5\' 4"  (1.626 m)   Wt 244 lb (110.7 kg)   BMI 41.88 kg/m   A1c = 10.9% (01/24/218) A1c = 8.0% (11/29/2015)    Assessment:    Diabetes Mellitus type I, under inadequate control but improving   Diabetic neuropathy Stage 3 chronic kidney disease Obesity  Plan:    1.  RX changes:   Continue NPH insulin 40 units bid  Continue Regular insulin 30 units prior to each meal 2.  Education:  interpretation of lab results, blood sugar goals, hypoglycemia prevention and treatment, self-monitoring of blood glucose skills and carbohydrate counting   Reviewed serving sizes of snacks (particularly recommended decrease crackers / nabs to just 3 for snack and to limit intake of sugar) 3.  Compliance at present is estimated to be good. Efforts to improve compliance (if necessary) will be directed at dietary modifications,  increased exercise and regular blood sugar monitoring. 4. Continuous Glucose monitor was placed today to try to get better understanding of BG trends.  Patient educated about proper care of CGM and site.  CGM was placed on back of upper left arm.  Patient will wear at least 5 days and up to 14 days.  He is to keep BG, diet and insulin administration records. He can engage in regular activities with special care when showering, changing clothes and swimming (only up to 3 feet and for 30 minutes at a time)  5.  Follow up: 2 weeks  Total time spent with patient = 30 minutes  Cherre Robins, PharmD, CPP

## 2016-10-26 NOTE — Patient Instructions (Signed)
Continuous Glucose monitor was placed today to try to get better understanding of your blood glucose readings trends.  Continuous Glucose Monitor was placed on back of your upper left arm.    Wear at least 5 days and up to 14 days.    Keep record of blood glucose, diet and insulin administration records.   You can engage in regular activities with special care when showering, changing clothes and swimming (only up to 3 feet and for 30 minutes at a time)

## 2016-10-27 ENCOUNTER — Other Ambulatory Visit: Payer: Self-pay | Admitting: Pediatrics

## 2016-11-03 ENCOUNTER — Encounter: Payer: Self-pay | Admitting: Nurse Practitioner

## 2016-11-03 IMAGING — CR DG KNEE 1-2V*R*
2 series · 2 of 2 positions shown · non-contrast
Comparison: None.

CLINICAL DATA: Fall x2.  Initial evaluation.

EXAM:
RIGHT KNEE - 1-2 VIEW

[view not recorded (1 of 2)]
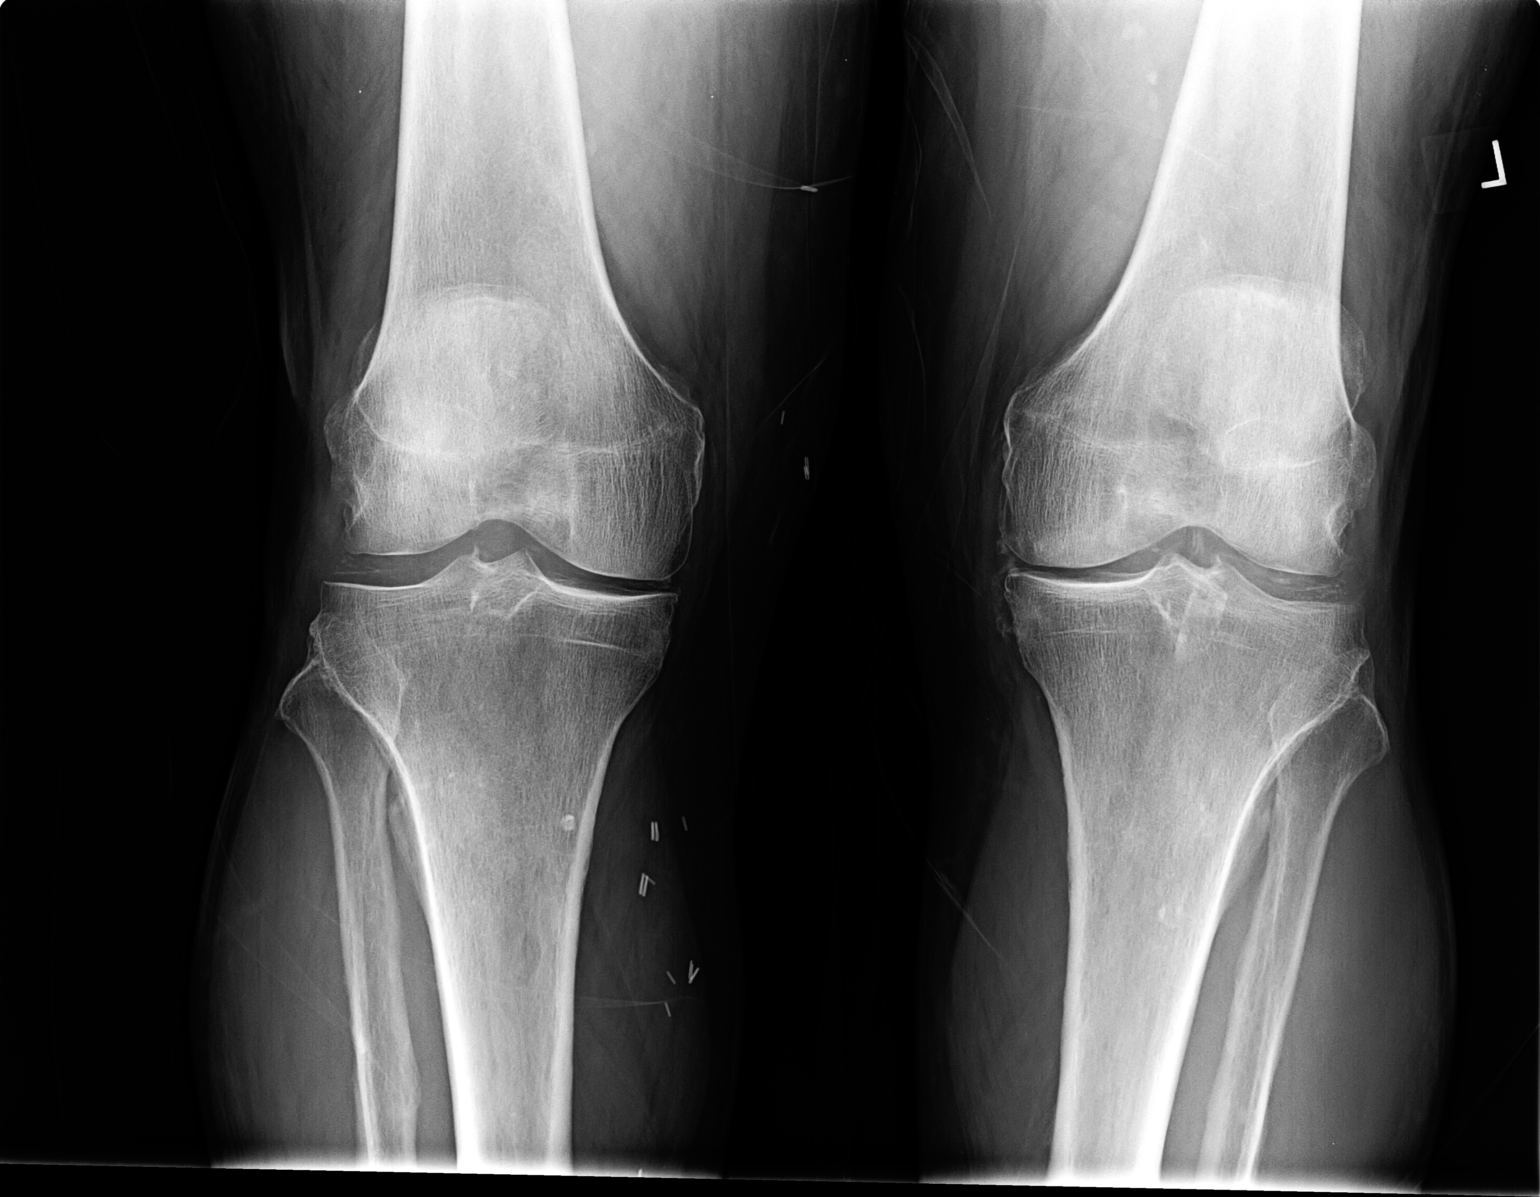

[view not recorded (2 of 2)]
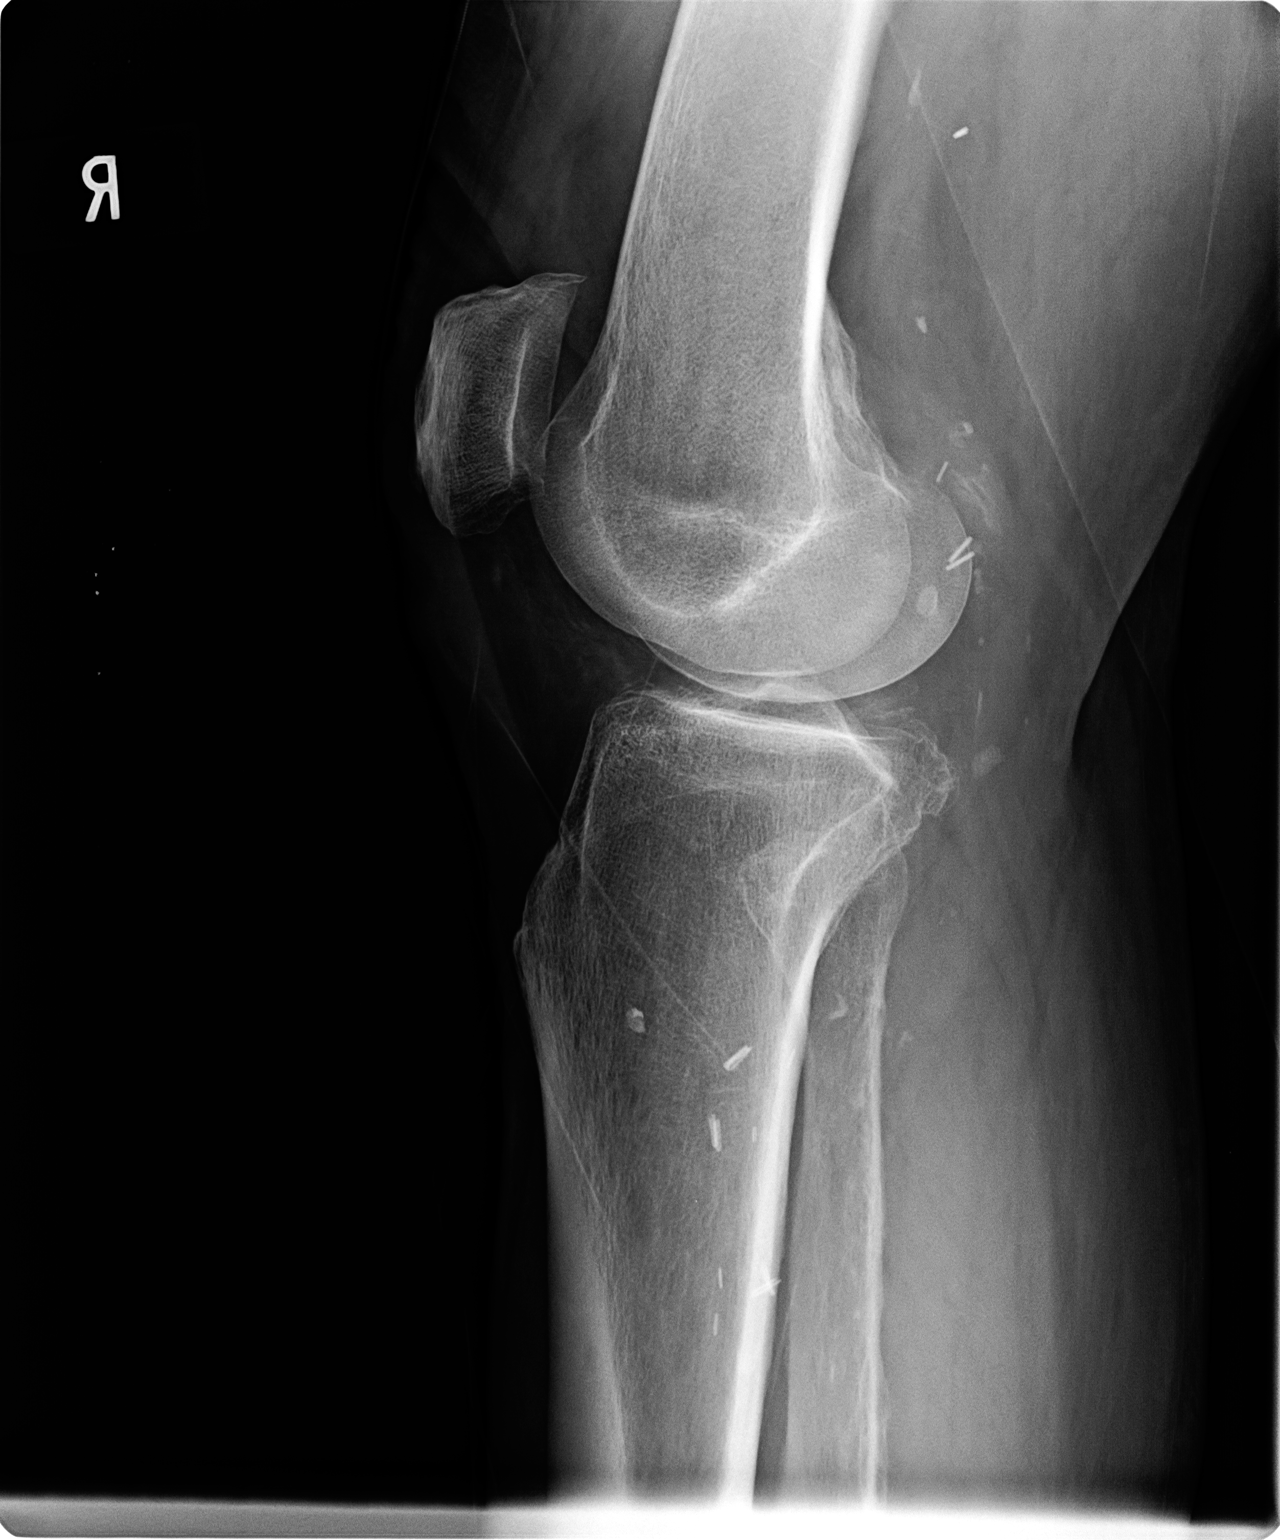

[2 of 2 positions shown; findings below may reference images not displayed]

FINDINGS: Surgical clips are noted over the right lower extremity. No acute
bony or joint abnormality. Tricompartment degenerative change.
Chondrocalcinosis noted. This is most likely degenerative.
IMPRESSION: No acute abnormality. Prominent tricompartment degenerative change
with chondrocalcinosis.

## 2016-11-04 ENCOUNTER — Encounter: Payer: Self-pay | Admitting: Nurse Practitioner

## 2016-11-04 ENCOUNTER — Ambulatory Visit (INDEPENDENT_AMBULATORY_CARE_PROVIDER_SITE_OTHER): Payer: Medicare Other | Admitting: Nurse Practitioner

## 2016-11-04 VITALS — BP 135/78 | HR 60 | Resp 22 | Ht 64.0 in | Wt 245.0 lb

## 2016-11-04 DIAGNOSIS — G4733 Obstructive sleep apnea (adult) (pediatric): Secondary | ICD-10-CM

## 2016-11-04 DIAGNOSIS — Z9989 Dependence on other enabling machines and devices: Secondary | ICD-10-CM

## 2016-11-04 NOTE — Patient Instructions (Addendum)
Continue CPAP at current settings compliance is excellent  Follow-up yearly and when necessarynext with Dohmeier

## 2016-11-04 NOTE — Progress Notes (Signed)
[facility]  PATIENT: Manuel Kline DOB: 1931/10/08   REASON FOR VISIT: follow-up obstructive sleep apnea on CPAP HISTORY FROM:patient    HISTORY OF PRESENT ILLNESS: HISTORYCD3-7-16; interval history : Manuel Kline is seen here today for his interval history. He had undergone a repeat sleep study which confirmed obstructive sleep apnea still to be present. The patient has meanwhile learned that he has chronic kidney disease grade 3 and his medications have been changed to eliminate those that burden the kidney. His sleep study took place on 08-09-14. The patient had an AHI of 54.8 oxygen nadir was 81% with 67.6 minutes of desaturation. The patient was split to a Pap pressure of 10 cm water under which his AHI became 0.0. His machine has been reset to this new pressure. I would like to add that his apnea was positional dependent and that he was placed on an Eason nasal mask by [employer] in medium size, which he likes better. Manuel Kline has again 100% compliance for the last 30 days of CPAP use dated 11-01-14 he used to machine 29 out of 30 days over 4 hours at night making it at 97% compliance for time of use. On average he uses the machine at night 7 hours and 27 minutes. The set pressure is 10 cm water with 3 cm EPR his residual AHI is 0.5. Today's Epworth sleepiness score was endorsed at 8 points and his fatigue severity score is 12 points.  From a sleep standpoint, the patient is doing excellent.  The patient is followed by [facility] , No; [phone].  3/7/17CM . Manuel Kline, 83 year old returns for follow-up. He has obstructive sleep apnea on CPAP. His compliance report today is greater than 4 hours at 87%. Patient claims he was sick with a respiratory infection and could not wear his CPAP for those other days. Average usage 6 hours 5 minutes. Set pressure is 10 cm of water EPR level is 3. AHI 0.3. Epworth score today is 7. Fatigue severity score is 11.  He returns for reevaluation UPDATE 11/04/16 CM Manuel Kline is a 83 year old male returns today for follow-up for obstructive sleep apnea on CPAP. He brings in his chip so that a compliance report can be obtained. He is 100  percent compliance greater than 4 hours for 30 days. Average usage 6 hours 37 minutes. Set pressure is 10 cm of water. EPR level is 3 AHI 0.4 Epworth score today 8 fatigue severity score11. He returns for reevaluation  REVIEW OF SYSTEMS: Full 14 system review of systems performed and notable only for those listed, all others are neg:  Constitutional: neg  Cardiovascular: neg Ear/Nose/Throat: neg  Skin: neg Eyes: neg Respiratory: neg Gastroitestinal: neg  Hematology/Lymphatic: neg  Endocrine: neg Musculoskeletal:neg Allergy/Immunology: neg Neurological: neg Psychiatric: neg Sleep : obstructive sleep apnea on CPAP   ALLERGIES: No Known Allergies  HOME MEDICATIONS: Outpatient Medications Prior to Visit  Medication Sig Dispense Refill  . albuterol (PROVENTIL HFA;VENTOLIN HFA) 108 (90 Base) MCG/ACT inhaler Inhale 2 puffs into the lungs every 6 (six) hours as needed for wheezing or shortness of breath. 1 Inhaler 2  . allopurinol (ZYLOPRIM) 300 MG tablet TAKE ONE-HALF TABLET BY MOUTH ONCE DAILY 45 tablet 0  . aspirin 81 MG tablet Take 81 mg by mouth daily.     Marland Kitchen atorvastatin (LIPITOR) 80 MG tablet Take 40 mg by mouth daily.    . cloNIDine (CATAPRES) 0.1 MG tablet Take 1 tablet by mouth  2 (two) times daily.    . Cyanocobalamin (VITAMIN B 12 PO) Take 1,000 mcg by mouth daily.    Marland Kitchen docusate sodium (COLACE) 100 MG capsule Take 100 mg by mouth 2 (two) times daily.    . fluticasone (FLONASE) 50 MCG/ACT nasal spray Place 1 spray into both nostrils 2 (two) times daily as needed for allergies or rhinitis. 16 g 6  . furosemide (LASIX) 40 MG tablet Take 1 tablet by mouth 2 (two) times daily.    Marland Kitchen gabapentin (NEURONTIN) 300 MG capsule TAKE ONE CAPSULE BY MOUTH TWICE DAILY 60 capsule 2   . glucose blood test strip Use as instructed 100 each 12  . HUMULIN N 100 UNIT/ML injection INJECT 40 UNITS SUBCUTANEOUSLY TWICE DAILY BEFORE MEAL(S) 30 mL 2  . metoprolol succinate (TOPROL-XL) 50 MG 24 hr tablet Take 50 mg by mouth daily.    . Multiple Vitamin (MULTIVITAMIN) tablet Take 1 tablet by mouth daily.     . nitroGLYCERIN (NITROSTAT) 0.4 MG SL tablet Place 0.4 mg under the tongue every 5 (five) minutes as needed for chest pain.    Marland Kitchen NOVOLIN R RELION 100 UNIT/ML injection INJECT 30 UNITS INTO THE SKIN 3 TIMES DAILY BEFORE MEALS 30 mL 1  . Omega-3 Fatty Acids (FISH OIL) 1000 MG CPDR Take 1 tablet by mouth daily.    Marland Kitchen omeprazole (PRILOSEC) 20 MG capsule TAKE ONE CAPSULE BY MOUTH TWICE DAILY BEFORE MEAL(S) 60 capsule 5  . quinapril (ACCUPRIL) 40 MG tablet Take 40 mg by mouth at bedtime.     Marland Kitchen Spacer/Aero Chamber Mouthpiece MISC 1 each by Does not apply route every 6 (six) hours as needed. 1 each 0   No facility-administered medications prior to visit.     PAST MEDICAL HISTORY: Past Medical History:  Diagnosis Date  . Adenomatous colon polyp 2006  . CAD (coronary artery disease)   . Calcium oxalate renal stones   . Cardiomyopathy   . Cataract   . Diabetes ([place])   . Erectile dysfunction   . Erosive esophagitis   . Hemorrhoids   . HLD (hyperlipidemia)   . HTN (hypertension)   . Hypertensive heart disease without CHF 07/31/2011  . ICD (implantable cardiac defibrillator) in place   . ICD dual chamber in situ   . Metabolic syndrome   . Morbid obesity ([place])   . Osteoarthritis   . S/P CABG (coronary artery bypass graft) 11/02/2000  . Sleep apnea     PAST SURGICAL HISTORY: Past Surgical History:  Procedure Laterality Date  . ABDOMINAL EXPLORATION SURGERY    . BACK SURGERY     X'3  . cardiac bypass    . CARDIAC DEFIBRILLATOR PLACEMENT    . CARPAL TUNNEL RELEASE     X2, bilateral  . CATARACT EXTRACTION    . COLONOSCOPY  06/20/2012   Procedure: COLONOSCOPY;  Surgeon: Sable Feil, MD;  Location: [facility];  Service: Endoscopy;  Laterality: N/A;  . DOPPLER ECHOCARDIOGRAPHY  2003  . EP IMPLANTABLE DEVICE N/A 07/14/2016   Procedure: ICD Generator Changeout;  Surgeon: Evans Lance, MD;  Location: [facility];  Service: Cardiovascular;  Laterality: N/A;  . ESOPHAGOGASTRODUODENOSCOPY  06/20/2012   Procedure: ESOPHAGOGASTRODUODENOSCOPY (EGD);  Surgeon: Sable Feil, MD;  Location: [facility];  Service: Endoscopy;  Laterality: N/A;  . EYE SURGERY    . LAPAROTOMY    . RETINOPATHY SURGERY Bilateral   . rotator cuff surgery     left    FAMILY  HISTORY: Family History  Problem Relation Age of Onset  . Diabetes Brother   . Heart disease Father   . Heart disease Mother   . Diabetes Mother   . Diabetes Brother   . Diabetes Brother   . Throat cancer Paternal Uncle     SOCIAL HISTORY: Social History   Social History  . Marital status: Married    Spouse name: N/A  . Number of children: 4  . Years of education: N/A   Occupational History  . Retired from [employer] Retired   Social History Main Topics  . Smoking status: Never Smoker  . Smokeless tobacco: Never Used  . Alcohol use No  . Drug use: No  . Sexual activity: Yes   Other Topics Concern  . Not on file   Social History Narrative   Right handed, Married, 4 kids from previous marriage.  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 Caffeine 1 cup daily.     PHYSICAL EXAM  Vitals:   11/04/16 1303  BP: 135/78  Pulse: 60  Resp: (!) 22  Weight: 245 lb (111.1 kg)  Height: 5\' 4"  (1.626 m)   Body mass index is 42.05 kg/m. General: The patient is awake, alert and appears not in acute distress. The patient is well groomed.He is morbidly obese Head: Normocephalic, atraumatic.  Neck is supple. Mallampati 3-4. neck circumference: 19 inches .   Cardiovascular: Regular rate and rhythm , without murmurs Respiratory: Lungs are clear to auscultation. Skin: Without evidence of edema, or  rash Neurologic exam : The patient is awake and alert, oriented to place and time. Memory subjective described as intact. There is a normal attention span & concentration ability. Speech is fluent without dysarthria, dysphonia or aphasia. Mood and affect are appropriate.ESS 8. FSS 11 Cranial nerves: Pupils are reactive to light. Visual fields by finger perimetry are intact. Hearing loss.  Facial sensation intact to fine touch. Facial motor strength is symmetric and tongue and uvula move midline. Motor exam: Normal tone, muscle bulk and symmetric strength in all extremities. Coordination: Rapid alternating movements in the fingers/hands is slowed . Finger-to-nose maneuver normal without evidence of ataxia, dysmetria or tremor. Gait and station: Arises from the chair unassisted, ambulated short distance in the hall stance is normal, tandem is mildly unsteady  Deep tendon reflexes: Depressed in the upper and lower extremities . Babinski maneuver response is equivocal  DIAGNOSTIC DATA (LABS, IMAGING, TESTING) - I reviewed patient records, labs, notes, testing and imaging myself where available.  Lab Results  Component Value Date   WBC 7.0 07/03/2016   HGB 13.1 (L) 07/03/2016   HCT 37.3 (L) 07/03/2016   MCV 86.1 07/03/2016   PLT 168 07/03/2016      Component Value Date/Time   NA 140 09/23/2016 0833   K 4.3 09/23/2016 0833   CL 99 09/23/2016 0833   CO2 23 09/23/2016 0833   GLUCOSE 105 (H) 09/23/2016 0833   GLUCOSE 129 (H) 07/03/2016 1028   BUN 43 (H) 09/23/2016 0833   CREATININE 2.47 (H) 09/23/2016 0833   CREATININE 2.04 (H) 07/03/2016 1028   CALCIUM 8.6 09/23/2016 0833   PROT 7.0 08/29/2015 0841   ALBUMIN 4.5 08/29/2015 0841   AST 17 08/29/2015 0841   ALT 21 08/29/2015 0841   ALKPHOS 128 (H) 08/29/2015 0841   BILITOT 0.5 08/29/2015 0841   GFRNONAA 23 (L) 09/23/2016 0833   GFRAA 27 (L) 09/23/2016 3662    ASSESSMENT AND PLAN  83 y.o. year old male  has a  past medical  history of  Sleep apnea; Morbid obesity ([place]); here to follow up. compliance report today is greater than 4 hours 100% for 30 days. Average usage 6 hours 37 minutes. Set pressure is 10 cm of water. EPR level is 3 AHI 0.4 Epworth score today 8 fatigue severity score11  Continue CPAP at current settings compliance is excellent  Follow-up yearly and when necessary, mext with Dr. Brett Fairy I spent 15 minutes in total face to face time with the patient more than 50% of which was spent counseling and coordination of care, reviewing test results reviewing medications and discussing and reviewing the diagnosis of obstructive sleep apnea and continued compliance with CPAP Dennie Bible, [facility], [facility], APRN  [facility] [address], [place], [place] [address] [phone]

## 2016-11-09 ENCOUNTER — Ambulatory Visit: Payer: Self-pay | Admitting: Pharmacist

## 2016-11-12 ENCOUNTER — Encounter: Payer: Self-pay | Admitting: Family

## 2016-11-12 ENCOUNTER — Ambulatory Visit (INDEPENDENT_AMBULATORY_CARE_PROVIDER_SITE_OTHER): Payer: Medicare Other | Admitting: Family

## 2016-11-12 VITALS — BP 157/84 | HR 63 | Temp 96.8°F | Ht 64.0 in | Wt 241.0 lb

## 2016-11-12 DIAGNOSIS — J0110 Acute frontal sinusitis, unspecified: Secondary | ICD-10-CM

## 2016-11-12 DIAGNOSIS — R6889 Other general symptoms and signs: Secondary | ICD-10-CM | POA: Diagnosis not present

## 2016-11-12 LAB — VERITOR FLU A/B WAIVED
INFLUENZA A: NEGATIVE
Influenza B: NEGATIVE

## 2016-11-12 MED ORDER — AMOXICILLIN-POT CLAVULANATE 875-125 MG PO TABS: 1.0000 | ORAL_TABLET | Freq: Two times a day (BID) | ORAL | 0 refills | Status: DC

## 2016-11-12 NOTE — Progress Notes (Signed)
   Subjective:    Patient ID: Manuel Kline, male    DOB: Feb 04, 1932, 83 y.o.   MRN: [id number]  Cough  This is a new problem. The current episode started in the past 7 days. The problem has been gradually worsening. The problem occurs every few minutes. The cough is non-productive. Associated symptoms include chills, a fever, myalgias, nasal congestion, postnasal drip, rhinorrhea, a sore throat, shortness of breath, sweats and wheezing. Pertinent negatives include no ear congestion, ear pain or headaches. The symptoms are aggravated by lying down. He has tried rest and OTC cough suppressant for the symptoms. The treatment provided mild relief. There is no history of asthma or COPD.      Review of Systems  Constitutional: Positive for chills and fever.  HENT: Positive for postnasal drip, rhinorrhea and sore throat. Negative for ear pain.   Respiratory: Positive for cough, shortness of breath and wheezing.   Musculoskeletal: Positive for myalgias.  Neurological: Negative for headaches.  All other systems reviewed and are negative.      Objective:   Physical Exam  Constitutional: He is oriented to person, place, and time. He appears well-developed and well-nourished. He appears ill. No distress.  HENT:  Head: Normocephalic.  Right Ear: External ear normal.  Left Ear: External ear normal.  Nose: Mucosal edema and rhinorrhea present.  Mouth/Throat: Posterior oropharyngeal erythema present.  Eyes: Pupils are equal, round, and reactive to light. Right eye exhibits no discharge. Left eye exhibits no discharge.  Neck: Normal range of motion. Neck supple. No thyromegaly present.  Cardiovascular: Normal rate, regular rhythm, normal heart sounds and intact distal pulses.   No murmur heard. Pulmonary/Chest: Effort normal and breath sounds normal. No respiratory distress. He has no wheezes.  Abdominal: Soft. Bowel sounds are normal. He exhibits no distension. There is no tenderness.    Musculoskeletal: Normal range of motion. He exhibits no edema or tenderness.  Neurological: He is alert and oriented to person, place, and time. He has normal reflexes. No cranial nerve deficit.  Skin: Skin is warm and dry. No rash noted. No erythema.  Psychiatric: He has a normal mood and affect. His behavior is normal. Judgment and thought content normal.  Vitals reviewed.     BP (!) 157/84   Pulse 63   Temp (!) 96.8 F (36 C) (Oral)   Ht 5\' 4"  (1.626 m)   Wt 241 lb (109.3 kg)   BMI 41.37 kg/m      Assessment & Plan:  1. Flu-like symptoms - Veritor Flu A/B Waived  2. Acute frontal sinusitis, recurrence not specified - Take meds as prescribed - Use a cool mist humidifier  -Use saline nose sprays frequently -Saline irrigations of the nose can be very helpful if done frequently.  * 4X daily for 1 week*  * Use of a nettie pot can be helpful with this. Follow directions with this* -Force fluids -For any cough or congestion  Use plain Mucinex- regular strength or max strength is fine   * Children- consult with Pharmacist for dosing -For fever or aces or pains- take tylenol or ibuprofen appropriate for age and weight.  * for fevers greater than 101 orally you may alternate ibuprofen and tylenol every  3 hours. -Throat lozenges if help -New toothbrush in 3 days - amoxicillin-clavulanate (AUGMENTIN) 875-125 MG tablet; Take 1 tablet by mouth 2 (two) times daily.  Dispense: 14 tablet; Refill: 0   Evelina Dun, FNP

## 2016-11-12 NOTE — Patient Instructions (Signed)

## 2016-11-20 ENCOUNTER — Encounter: Payer: Self-pay | Admitting: Family Medicine

## 2016-11-20 ENCOUNTER — Ambulatory Visit (INDEPENDENT_AMBULATORY_CARE_PROVIDER_SITE_OTHER): Payer: Medicare Other | Admitting: Family Medicine

## 2016-11-20 VITALS — BP 131/62 | HR 59 | Temp 96.9°F | Ht 64.0 in | Wt 243.0 lb

## 2016-11-20 DIAGNOSIS — J209 Acute bronchitis, unspecified: Secondary | ICD-10-CM | POA: Diagnosis not present

## 2016-11-20 MED ORDER — DOXYCYCLINE HYCLATE 100 MG PO TABS: 100.0000 mg | ORAL_TABLET | Freq: Two times a day (BID) | ORAL | 0 refills | Status: DC

## 2016-11-20 MED ORDER — FLUTICASONE PROPIONATE 50 MCG/ACT NA SUSP: 1.0000 | Freq: Two times a day (BID) | NASAL | 6 refills | Status: DC | PRN

## 2016-11-20 MED ORDER — METHYLPREDNISOLONE ACETATE 80 MG/ML IJ SUSP
40.0000 mg | Freq: Once | INTRAMUSCULAR | Status: AC
  Administered 2016-11-20: 40 mg via INTRAMUSCULAR

## 2016-11-20 NOTE — Progress Notes (Signed)
BP 131/62   Pulse (!) 59   Temp (!) 96.9 F (36.1 C) (Oral)   Ht 5\' 4"  (1.626 m)   Wt 243 lb (110.2 kg)   BMI 41.71 kg/m    Subjective:    Patient ID: Manuel Kline, male    DOB: 1931-11-04, 83 y.o.   MRN: [id number]  HPI: Manuel Kline is a 83 y.o. male presenting on 11/20/2016 for Cough (pt here today c/o continued cough and just finished Augmentin)   HPI Cough and congestion Patient has been having cough and congestion has been going on for the past 2 weeks and started in his sinuses but now is getting more into his chest. He took Augmentin and he just finished that a few days ago and finished the full course of it and feels like it had improved but not completely resolved and then it's worsened since he finished it. He denies any shortness of breath or wheezing or fevers or chills. He's been having a lot of coughing and congestion and coughing up sputum and nasal drainage and sinus pressure that still going on.  Relevant past medical, surgical, family and social history reviewed and updated as indicated. Interim medical history since our last visit reviewed. Allergies and medications reviewed and updated.  Review of Systems  Constitutional: Negative for chills and fever.  HENT: Positive for congestion, postnasal drip, rhinorrhea, sinus pressure and sore throat. Negative for ear discharge, ear pain, sneezing and voice change.   Eyes: Negative for pain, discharge, redness and visual disturbance.  Respiratory: Positive for cough and chest tightness. Negative for shortness of breath and wheezing.   Cardiovascular: Negative for chest pain and leg swelling.  Musculoskeletal: Negative for gait problem.  Skin: Negative for rash.  All other systems reviewed and are negative.   Per HPI unless specifically indicated above     Objective:    BP 131/62   Pulse (!) 59   Temp (!) 96.9 F (36.1 C) (Oral)   Ht 5\' 4"  (1.626 m)   Wt 243 lb (110.2 kg)   BMI 41.71 kg/m   Wt Readings from  Last 3 Encounters:  11/20/16 243 lb (110.2 kg)  11/12/16 241 lb (109.3 kg)  11/04/16 245 lb (111.1 kg)    Physical Exam  Constitutional: He is oriented to person, place, and time. He appears well-developed and well-nourished. No distress.  HENT:  Right Ear: Tympanic membrane, external ear and ear canal normal.  Left Ear: Tympanic membrane, external ear and ear canal normal.  Nose: Mucosal edema and rhinorrhea present. No sinus tenderness. No epistaxis. Right sinus exhibits maxillary sinus tenderness. Right sinus exhibits no frontal sinus tenderness. Left sinus exhibits maxillary sinus tenderness. Left sinus exhibits no frontal sinus tenderness.  Mouth/Throat: Uvula is midline and mucous membranes are normal. Posterior oropharyngeal edema and posterior oropharyngeal erythema present. No oropharyngeal exudate or tonsillar abscesses.  Eyes: Conjunctivae are normal. No scleral icterus.  Neck: Neck supple.  Cardiovascular: Normal rate, regular rhythm, normal heart sounds and intact distal pulses.   No murmur heard. Pulmonary/Chest: Effort normal. No respiratory distress. He has no wheezes. He has rhonchi in the right upper field and the left upper field. He has no rales.  Musculoskeletal: Normal range of motion. He exhibits no edema.  Lymphadenopathy:    He has no cervical adenopathy.  Neurological: He is alert and oriented to person, place, and time. Coordination normal.  Skin: Skin is warm and dry. No rash noted. He is not diaphoretic.  Psychiatric: He has a normal mood and affect. His behavior is normal.  Nursing note and vitals reviewed.     Assessment & Plan:   Problem List Items Addressed This Visit    None    Visit Diagnoses    Acute bronchitis, unspecified organism    -  Primary   Relevant Medications   methylPREDNISolone acetate (DEPO-MEDROL) injection 40 mg (Completed)   doxycycline (VIBRA-TABS) 100 MG tablet   fluticasone (FLONASE) 50 MCG/ACT nasal spray       Follow up  plan: Return if symptoms worsen or fail to improve.  Counseling provided for all of the vaccine components No orders of the defined types were placed in this encounter.   Caryl Pina, MD [facility] 11/20/2016, 12:51 PM

## 2016-11-30 ENCOUNTER — Ambulatory Visit (INDEPENDENT_AMBULATORY_CARE_PROVIDER_SITE_OTHER): Payer: Medicare Other | Admitting: Pharmacist

## 2016-11-30 ENCOUNTER — Other Ambulatory Visit: Payer: Self-pay | Admitting: Pediatrics

## 2016-11-30 ENCOUNTER — Encounter: Payer: Self-pay | Admitting: Pharmacist

## 2016-11-30 VITALS — BP 112/70 | HR 68 | Ht 64.0 in | Wt 243.0 lb

## 2016-11-30 DIAGNOSIS — E1165 Type 2 diabetes mellitus with hyperglycemia: Secondary | ICD-10-CM | POA: Diagnosis not present

## 2016-11-30 DIAGNOSIS — IMO0002 Reserved for concepts with insufficient information to code with codable children: Secondary | ICD-10-CM

## 2016-11-30 DIAGNOSIS — E1140 Type 2 diabetes mellitus with diabetic neuropathy, unspecified: Secondary | ICD-10-CM | POA: Diagnosis not present

## 2016-11-30 NOTE — Progress Notes (Signed)
Patient ID: Manuel Kline, male   DOB: 1932-08-08, 83 y.o.   MRN: [id number]   Subjective:      LANG Manuel Kline is a 83 y.o. male who presents for  reevaluation of Type 2 diabetes mellitus.  The initial diagnosis of diabetes was made over 2 years ago.  In February we placed a CGM and he wore for about 8 days before it fell off.  Due to weather we have not been able to download results yet. He kept CGM and brought into office today to download results.    His clinical course has fluctuated. Insulin dosage review with Mr Mcclenahan and he reports compliance most of the time.  He admits that some nights he misses his mealtime Regular insulin because he is not at home but tried to make sure is takes when he gets home. He is currently taking Novolog 30 units prior to each meal units and NPH 40 units bid  Compliance with blood glucose monitoring: good.  The patient does perform independently. Rotation of sites for injection: abdominal wall Exercise: gardens some but not much other exercise Diet: trying to limit CHOs more lately.  His grandson, his wife and great grandson have moved in.  Grandson's wife is health care worker and has been helping him to limit CHO intake.  Beverages is sugar free lemonade, water or black coffee  Blood glucose reading from home: 210, 197, 161, 90, 101, 153, 149, 248, 122, 248, 122, 234, 148 7 day avg = 170 14 day avg = 168 30 day avg = 160 90 day avg = 162  CGM Reports:  Average BG = 165 Estimated A1c = 7.4% Noted several hypoglycemic events - 9 total.  Most hypoglycemia  Occurred between midnight and 4am.  Also had 2 minor events between noon and 2pm. From 10/26/2016 to 11/03/2016:  21% of time in target 12% of time below target 67% of time above target.  The following portions of the patient's history were reviewed and updated as appropriate: allergies, current medications, past family history, past medical history, past social history, past surgical history and problem  list.   Objective:    BP 112/70   Pulse 68   Ht 5\' 4"  (1.626 m)   Wt 243 lb (110.2 kg)   BMI 41.71 kg/m   A1c = 10.9% (01/24/218) A1c = 8.0% (11/29/2015)    Assessment:    Diabetes Mellitus type I, under inadequate control but improving   Diabetic neuropathy Stage 3 chronic kidney disease Obesity  Plan:    1.  RX changes:   Change NPH insulin 40 units qam and 35 units qpm  Continue Regular insulin 30 units prior to each meal 2.  Education:  interpretation of lab results, blood sugar goals, hypoglycemia prevention and treatment, self-monitoring of blood glucose skills and carbohydrate counting   Reviewed CHO servings 3.  Discussed increasing exercise - start with 5-10 minutes of walking or stationary bike and increase as able.  4.  Reviewed s/s of hypoglycemia and how to treat 5.  Follow up: 3 weeks with PCP;  Will do AWV in July 2018  Total time spent with patient = 30 minutes  Cherre Robins, PharmD, CPP

## 2016-11-30 NOTE — Patient Instructions (Signed)
Change NPH insulin - inject 40 unit each morning and 35 units each evening Continue Regular insulin 30 minutes prior to each meal   Diabetes and Standards of Medical Care   Diabetes is complicated. You may find that your diabetes team includes a dietitian, nurse, diabetes educator, eye doctor, and more. To help everyone know what is going on and to help you get the care you deserve, the following schedule of care was developed to help keep you on track. Below are the tests, exams, vaccines, medicines, education, and plans you will need.  Blood Glucose Goals Prior to meals = 80 - 130 Within 2 hours of the start of a meal = less than 180  HbA1c test (goal is less than 7.0% - your last value was 10.9%) This test shows how well you have controlled your glucose over the past 2 to 3 months. It is used to see if your diabetes management plan needs to be adjusted.   It is performed at least 2 times a year if you are meeting treatment goals.  It is performed 4 times a year if therapy has changed or if you are not meeting treatment goals.  Blood pressure test  This test is performed at every routine medical visit. The goal is less than 140/90 mmHg for most people, but 130/80 mmHg in some cases. Ask your health care provider about your goal.  Dental exam  Follow up with the dentist regularly.  Eye exam  If you are diagnosed with type 1 diabetes as a child, get an exam upon reaching the age of 56 years or older and have had diabetes for 3 to 5 years. Yearly eye exams are recommended after that initial eye exam.  If you are diagnosed with type 1 diabetes as an adult, get an exam within 5 years of diagnosis and then yearly.  If you are diagnosed with type 2 diabetes, get an exam as soon as possible after the diagnosis and then yearly.  Foot care exam  Visual foot exams are performed at every routine medical visit. The exams check for cuts, injuries, or other problems with the feet.  A  comprehensive foot exam should be done yearly. This includes visual inspection as well as assessing foot pulses and testing for loss of sensation.  Check your feet nightly for cuts, injuries, or other problems with your feet. Tell your health care provider if anything is not healing.  Kidney function test (urine microalbumin)  This test is performed once a year.  Type 1 diabetes: The first test is performed 5 years after diagnosis.  Type 2 diabetes: The first test is performed at the time of diagnosis.  A serum creatinine and estimated glomerular filtration rate (eGFR) test is done once a year to assess the level of chronic kidney disease (CKD), if present.  Lipid profile (cholesterol, HDL, LDL, triglycerides)  Performed every 5 years for most people.  The goal for LDL is less than 100 mg/dL. If you are at high risk, the goal is less than 70 mg/dL.  The goal for HDL is 40 mg/dL to 50 mg/dL for men and 50 mg/dL to 60 mg/dL for women. An HDL cholesterol of 60 mg/dL or higher gives some protection against heart disease.  The goal for triglycerides is less than 150 mg/dL.  Influenza vaccine, pneumococcal vaccine, and hepatitis B vaccine  The influenza vaccine is recommended yearly.  The pneumococcal vaccine is generally given once in a lifetime. However, there are some  instances when another vaccination is recommended. Check with your health care provider.  The hepatitis B vaccine is also recommended for adults with diabetes.  Diabetes self-management education  Education is recommended at diagnosis and ongoing as needed.  Treatment plan  Your treatment plan is reviewed at every medical visit.  Document Released: 06/14/2009 Document Revised: 04/19/2013 Document Reviewed: 01/17/2013 [facility] Patient Information 2014 [place].

## 2016-12-23 ENCOUNTER — Encounter: Payer: Self-pay | Admitting: Pediatrics

## 2016-12-23 ENCOUNTER — Ambulatory Visit (INDEPENDENT_AMBULATORY_CARE_PROVIDER_SITE_OTHER): Payer: Medicare Other | Admitting: Pediatrics

## 2016-12-23 VITALS — BP 117/71 | HR 58 | Temp 96.8°F | Ht 64.0 in | Wt 239.2 lb

## 2016-12-23 DIAGNOSIS — E785 Hyperlipidemia, unspecified: Secondary | ICD-10-CM

## 2016-12-23 DIAGNOSIS — E1140 Type 2 diabetes mellitus with diabetic neuropathy, unspecified: Secondary | ICD-10-CM

## 2016-12-23 DIAGNOSIS — IMO0002 Reserved for concepts with insufficient information to code with codable children: Secondary | ICD-10-CM

## 2016-12-23 DIAGNOSIS — N183 Chronic kidney disease, stage 3 unspecified: Secondary | ICD-10-CM

## 2016-12-23 DIAGNOSIS — E1165 Type 2 diabetes mellitus with hyperglycemia: Secondary | ICD-10-CM

## 2016-12-23 LAB — CBC WITH DIFFERENTIAL/PLATELET
BASOS: 0 %
Basophils Absolute: 0 10*3/uL (ref 0.0–0.2)
EOS (ABSOLUTE): 0.2 10*3/uL (ref 0.0–0.4)
EOS: 2 %
HEMATOCRIT: 39 % (ref 37.5–51.0)
Hemoglobin: 13.6 g/dL (ref 13.0–17.7)
IMMATURE GRANULOCYTES: 0 %
Immature Grans (Abs): 0 10*3/uL (ref 0.0–0.1)
LYMPHS ABS: 2.4 10*3/uL (ref 0.7–3.1)
Lymphs: 28 %
MCH: 29.8 pg (ref 26.6–33.0)
MCHC: 34.9 g/dL (ref 31.5–35.7)
MCV: 86 fL (ref 79–97)
MONOS ABS: 0.5 10*3/uL (ref 0.1–0.9)
Monocytes: 6 %
Neutrophils Absolute: 5.5 10*3/uL (ref 1.4–7.0)
Neutrophils: 64 %
PLATELETS: 188 10*3/uL (ref 150–379)
RBC: 4.56 x10E6/uL (ref 4.14–5.80)
RDW: 15.2 % (ref 12.3–15.4)
WBC: 8.5 10*3/uL (ref 3.4–10.8)

## 2016-12-23 LAB — BMP8+EGFR
BUN / CREAT RATIO: 20 (ref 10–24)
BUN: 41 mg/dL — ABNORMAL HIGH (ref 8–27)
CO2: 23 mmol/L (ref 18–29)
Calcium: 9 mg/dL (ref 8.6–10.2)
Chloride: 99 mmol/L (ref 96–106)
Creatinine, Ser: 2.09 mg/dL — ABNORMAL HIGH (ref 0.76–1.27)
GFR, EST AFRICAN AMERICAN: 33 mL/min/{1.73_m2} — AB (ref >59)
GFR, EST NON AFRICAN AMERICAN: 29 mL/min/{1.73_m2} — AB (ref >59)
Glucose: 117 mg/dL — ABNORMAL HIGH (ref 65–99)
POTASSIUM: 4.4 mmol/L (ref 3.5–5.2)
SODIUM: 141 mmol/L (ref 134–144)

## 2016-12-23 LAB — BAYER DCA HB A1C WAIVED: HB A1C (BAYER DCA - WAIVED): 7.4 % — ABNORMAL HIGH (ref <7.0)

## 2016-12-23 NOTE — Progress Notes (Signed)
  Subjective:   Patient ID: Manuel Kline, male    DOB: October 21, 1931, 83 y.o.   MRN: [id number] CC: Follow-up (3 month) multiple med problems HPI: Manuel Kline is a 83 y.o. male presenting for Follow-up (3 month)  CKD: following with nephrology Has appt coming up  CHF: no chest pain, no SOB Has been able to get out and do some gardening, getting raised garden beds ready  DM2: average last 7 days 136 Says was 90 this morning Taking meds regulalry No recent lows  Elevated BMI: Avoiding sugar, workingin garden as above, grandson helping with heavy lifting  Relevant past medical, surgical, family and social history reviewed. Allergies and medications reviewed and updated. History  Smoking Status  . Never Smoker  Smokeless Tobacco  . Never Used   ROS: Per HPI   Objective:    BP 117/71   Pulse (!) 58   Temp (!) 96.8 F (36 C) (Oral)   Ht '5\' 4"'$  (1.626 m)   Wt 239 lb 3.2 oz (108.5 kg)   BMI 41.06 kg/m   Wt Readings from Last 3 Encounters:  12/23/16 239 lb 3.2 oz (108.5 kg)  11/30/16 243 lb (110.2 kg)  11/20/16 243 lb (110.2 kg)    Gen: NAD, alert, cooperative with exam, NCAT EYES: EOMI, no conjunctival injection, or no icterus ENT:  TMs pearly gray b/l, OP without erythema LYMPH: no cervical LAD CV: NRRR, normal S1/S2, no murmur Resp: CTABL, no wheezes, normal WOB Abd: +BS, soft, NTND. no guarding or organomegaly Ext: No edema, warm Neuro: Alert and oriented  Assessment & Plan:  Manuel Kline was seen today for follow-up multiple med problems  Diagnoses and all orders for this visit:  Hyperlipidemia, unspecified hyperlipidemia type Stable, cont statin -     BMP8+EGFR  Morbid obesity (HCC) Lifestyle changes discussed, cont avoiding sugar -     BMP8+EGFR  Type 2 diabetes, uncontrolled, with neuropathy (HCC) A1c 7.4, improved Cont current meds -     Bayer DCA Hb A1c Waived -     BMP8+EGFR  CKD (chronic kidney disease) stage 3, GFR 30-59 ml/min Following with  nephrology Will check labs today, has appt upcoming -     BMP8+EGFR -     CBC with Differential/Platelet -     Protein / creatinine ratio, urine   Follow up plan:  3 mo Manuel Found, MD [place]

## 2016-12-24 LAB — PROTEIN / CREATININE RATIO, URINE
CREATININE, UR: 52.6 mg/dL
PROTEIN UR: 5 mg/dL
PROTEIN/CREAT RATIO: 95 mg/g{creat} (ref 0–200)

## 2017-01-01 ENCOUNTER — Ambulatory Visit (INDEPENDENT_AMBULATORY_CARE_PROVIDER_SITE_OTHER): Payer: Medicare Other | Admitting: Podiatry

## 2017-01-01 DIAGNOSIS — B351 Tinea unguium: Secondary | ICD-10-CM | POA: Diagnosis not present

## 2017-01-01 DIAGNOSIS — M2040 Other hammer toe(s) (acquired), unspecified foot: Secondary | ICD-10-CM

## 2017-01-01 DIAGNOSIS — M79609 Pain in unspecified limb: Secondary | ICD-10-CM

## 2017-01-01 DIAGNOSIS — E1142 Type 2 diabetes mellitus with diabetic polyneuropathy: Secondary | ICD-10-CM | POA: Diagnosis not present

## 2017-01-01 DIAGNOSIS — E1140 Type 2 diabetes mellitus with diabetic neuropathy, unspecified: Secondary | ICD-10-CM

## 2017-01-02 ENCOUNTER — Other Ambulatory Visit: Payer: Self-pay | Admitting: Pediatrics

## 2017-01-02 ENCOUNTER — Other Ambulatory Visit: Payer: Self-pay | Admitting: Family

## 2017-01-02 NOTE — Progress Notes (Signed)
Subjective:    Patient ID: Manuel Kline, male   DOB: 83 y.o.   MRN: [id number]   HPI patient presents stating he has symptomatic nail disease 1-5 both feet long-term history of diabetes with nerve loss and has structural deformity of both feet which diabetic shoes and then a good job of controlling in the past    ROS      Objective:  Physical Exam I noted neurologically diminished both sharp dull and vibratory along DTR reflexes with patient found to have mild vascular loss and noted to have digital deformities with rotation fifth digits bilateral with distal keratotic tissue formation and can be irritated. Has nail disease 1-5 both feet with yellow subungual debris and pain    Assessment:   Chronic mycotic nail infection 1-5 both feet with pain along with at risk diabetes with neurological loss and at risk type lesion formation      Plan:    H&P conditions reviewed. His diabetic shoes have worn out as her several years old and he will be approved for new pair and was molded today and I debrided nailbeds 1-5 both feet with no iatrogenic bleeding noted

## 2017-01-13 ENCOUNTER — Telehealth: Payer: Self-pay | Admitting: Cardiology

## 2017-01-13 ENCOUNTER — Encounter: Payer: Medicare Other | Admitting: *Deleted

## 2017-01-13 NOTE — Telephone Encounter (Signed)
LMOVM reminding pt to send remote transmission.   

## 2017-01-14 DIAGNOSIS — Z95810 Presence of automatic (implantable) cardiac defibrillator: Secondary | ICD-10-CM | POA: Diagnosis not present

## 2017-01-21 ENCOUNTER — Telehealth: Payer: Self-pay | Admitting: *Deleted

## 2017-01-21 MED ORDER — INSULIN REGULAR HUMAN 100 UNIT/ML IJ SOLN: INTRAMUSCULAR | 0 refills | Status: DC

## 2017-01-21 NOTE — Telephone Encounter (Signed)
Done

## 2017-01-26 ENCOUNTER — Telehealth: Payer: Self-pay | Admitting: Neurology

## 2017-01-26 DIAGNOSIS — Z9989 Dependence on other enabling machines and devices: Principal | ICD-10-CM

## 2017-01-26 DIAGNOSIS — G4733 Obstructive sleep apnea (adult) (pediatric): Secondary | ICD-10-CM

## 2017-01-26 NOTE — Telephone Encounter (Signed)
Order for cpap supplies generated, will send to [facility].  I called pt, advised him of this information, and informed him that it could take [facility] a couple days to process this order. Pt verbalized understanding.

## 2017-01-26 NOTE — Telephone Encounter (Signed)
Pt calling stating Dr Brett Fairy needs to send a prescription to [place] for his CPAP supplies, pt asking for call on home#

## 2017-01-26 NOTE — Addendum Note (Signed)
Addended by: Lester [place] A on: 01/26/2017 10:14 AM   Modules accepted: Orders

## 2017-01-28 DIAGNOSIS — G4733 Obstructive sleep apnea (adult) (pediatric): Secondary | ICD-10-CM | POA: Diagnosis not present

## 2017-02-01 ENCOUNTER — Other Ambulatory Visit: Payer: Self-pay | Admitting: Pediatrics

## 2017-02-09 DIAGNOSIS — N184 Chronic kidney disease, stage 4 (severe): Secondary | ICD-10-CM | POA: Diagnosis not present

## 2017-02-15 ENCOUNTER — Other Ambulatory Visit: Payer: Self-pay | Admitting: Pediatrics

## 2017-02-18 ENCOUNTER — Other Ambulatory Visit: Payer: Self-pay | Admitting: Pediatrics

## 2017-02-23 ENCOUNTER — Telehealth: Payer: Self-pay | Admitting: Podiatry

## 2017-02-23 ENCOUNTER — Ambulatory Visit (INDEPENDENT_AMBULATORY_CARE_PROVIDER_SITE_OTHER): Payer: Medicare Other | Admitting: Podiatry

## 2017-02-23 DIAGNOSIS — M2040 Other hammer toe(s) (acquired), unspecified foot: Secondary | ICD-10-CM

## 2017-02-23 DIAGNOSIS — L84 Corns and callosities: Secondary | ICD-10-CM

## 2017-02-23 DIAGNOSIS — E1140 Type 2 diabetes mellitus with diabetic neuropathy, unspecified: Secondary | ICD-10-CM | POA: Diagnosis not present

## 2017-02-23 NOTE — Telephone Encounter (Signed)
Needs pt to schedule appt to pick up Diabetic shoes

## 2017-03-01 NOTE — Progress Notes (Signed)

## 2017-03-08 DIAGNOSIS — I5022 Chronic systolic (congestive) heart failure: Secondary | ICD-10-CM | POA: Diagnosis not present

## 2017-03-08 DIAGNOSIS — I2510 Atherosclerotic heart disease of native coronary artery without angina pectoris: Secondary | ICD-10-CM | POA: Diagnosis not present

## 2017-03-08 DIAGNOSIS — I119 Hypertensive heart disease without heart failure: Secondary | ICD-10-CM | POA: Diagnosis not present

## 2017-03-08 DIAGNOSIS — I255 Ischemic cardiomyopathy: Secondary | ICD-10-CM | POA: Diagnosis not present

## 2017-03-08 DIAGNOSIS — E785 Hyperlipidemia, unspecified: Secondary | ICD-10-CM | POA: Diagnosis not present

## 2017-03-16 DIAGNOSIS — H01003 Unspecified blepharitis right eye, unspecified eyelid: Secondary | ICD-10-CM | POA: Diagnosis not present

## 2017-03-16 DIAGNOSIS — H04123 Dry eye syndrome of bilateral lacrimal glands: Secondary | ICD-10-CM | POA: Diagnosis not present

## 2017-03-16 DIAGNOSIS — H40013 Open angle with borderline findings, low risk, bilateral: Secondary | ICD-10-CM | POA: Diagnosis not present

## 2017-03-16 DIAGNOSIS — H547 Unspecified visual loss: Secondary | ICD-10-CM | POA: Diagnosis not present

## 2017-03-18 ENCOUNTER — Ambulatory Visit (INDEPENDENT_AMBULATORY_CARE_PROVIDER_SITE_OTHER): Payer: Medicare Other | Admitting: Pharmacist

## 2017-03-18 ENCOUNTER — Encounter: Payer: Self-pay | Admitting: Pharmacist

## 2017-03-18 VITALS — BP 118/70 | HR 60 | Ht 64.0 in | Wt 240.0 lb

## 2017-03-18 DIAGNOSIS — Z0000 Encounter for general adult medical examination without abnormal findings: Secondary | ICD-10-CM

## 2017-03-18 DIAGNOSIS — Z794 Long term (current) use of insulin: Secondary | ICD-10-CM

## 2017-03-18 DIAGNOSIS — E1140 Type 2 diabetes mellitus with diabetic neuropathy, unspecified: Secondary | ICD-10-CM

## 2017-03-18 DIAGNOSIS — Z9181 History of falling: Secondary | ICD-10-CM

## 2017-03-18 NOTE — Patient Instructions (Addendum)
  Manuel Kline , Thank you for taking time to come for your Medicare Wellness Visit. I appreciate your ongoing commitment to your health goals. Please review the following plan we discussed and let me know if I can assist you in the future.   These are the goals we discussed: I as sending referral to Physical therapy for balance assessment and to help with leg strength.   Continue as planned to start going to [employer] again.   We will give Shingrix and Boostrix (tetanus, dipteria and pertussis) vaccines at your next appointment - we are currently out of both.   Remember that Regular (clear) insulin should be given within 15 minutes of your meal.  If you are not sure if you will start meal within 15 minutes then recommend taking Regular (clear) insulin after your meal to prevent low blood glucose.   Continue current NPH insulin (cloudy) dose - 40 units with breakfast and 35 units with supper.   Increase non-starchy vegetables - carrots, green bean, squash, zucchini, tomatoes, onions, peppers, spinach and other green leafy vegetables, cabbage, lettuce, cucumbers, asparagus, okra (not fried), eggplant Limit sugar and processed foods (cakes, cookies, ice cream, crackers and chips) Increase fresh fruit but limit serving sizes 1/2 cup or about the size of tennis or baseball Limit red meat to no more than 1-2 times per week (serving size about the size of your palm) Choose whole grains / lean proteins - whole wheat bread, quinoa, whole grain rice (1/2 cup), fish, chicken, [place] Avoid sugar and calorie containing beverages - soda, sweet tea and juice.  Choose water or unsweetened tea instead.    This is a list of the screening recommended for you and due dates:  Health Maintenance  Topic Date Due  . Tetanus Vaccine  01/17/1951  . Flu Shot  03/31/2017  . Hemoglobin A1C  06/24/2017  . Eye exam for diabetics  09/10/2017  . Complete foot exam   09/23/2017  . Pneumonia vaccines  Completed

## 2017-03-18 NOTE — Progress Notes (Signed)
Patient ID: Manuel Kline, male   DOB: [birth year], 83 y.o.   MRN: [id number]     Subjective:   Manuel Kline is a 83 y.o. male who presents for a subsequent Medicare Annual Wellness Visit.  Social History: Born/Raised: [place], [place] Occupational history: retired since 2003; was self employed - plummer. He continues to be active in the [employer], church and likes to garden and do yard work. Marital history: married;  Has 4 children  Lives with his wife - his grandson, his wife and 24 yo child currently live with them but they are moving to new home 03/19/17. Alcohol/Tobacco/Substances: none   [name] reports that he has a fall 03/13/2017 while at [employer].  He has just eaten and had gotten up because he felt that this BG was low and was going to get a packet of sugar.  He got dizzy and fell.  Two ladies gave him assistance.  He repots that he has injected his Regular insulin prior to leaving home and did not eat until about 20 minutes after administration. Patient did not have glucometer with him to check BG He also reports experiencing increased weakness in legs - he is not using scooter when shopping at [place] or [employer]. 03/12/2017 he received Life Alert to use at home.  He is not using ambulatory assistance devices like cane or walker though his wife has suggested that he use a cane.    HBG readings:  7 day avg = 166 14 day avg = 157 30 day avg = 159 90 day avg = 151  Identified 3 hypoglycemic event in glucometer over last 90 days - all occurred between 10am and noon Last 30 days - 52% of BG reading over goal   43% within goal   5% below goal  Current Medications (verified) Outpatient Encounter Prescriptions as of 03/18/2017  Medication Sig  . allopurinol (ZYLOPRIM) 300 MG tablet TAKE 1/2 (ONE-HALF) TABLET BY MOUTH ONCE DAILY  . aspirin 81 MG tablet Take 81 mg by mouth daily.   Marland Kitchen atorvastatin (LIPITOR) 80 MG tablet Take 40 mg by mouth daily.  . cloNIDine (CATAPRES) 0.1  MG tablet Take 1 tablet by mouth 2 (two) times daily.  . Cyanocobalamin (VITAMIN B 12 PO) Take 1,000 mcg by mouth daily.  . fluticasone (FLONASE) 50 MCG/ACT nasal spray Place 1 spray into both nostrils 2 (two) times daily as needed for allergies or rhinitis.  . furosemide (LASIX) 40 MG tablet Take 1 tablet by mouth 2 (two) times daily.  Marland Kitchen gabapentin (NEURONTIN) 300 MG capsule TAKE 1 CAPSULE BY MOUTH TWICE DAILY  . glucose blood test strip Use as instructed  . HUMULIN N 100 UNIT/ML injection INJECT 40 UNITS SUBCUTANEOUSLY TWICE DAILY BEFORE MEALS (Patient taking differently: inject 40 unit in am and 35 unit in pm)  . insulin regular (NOVOLIN R RELION) 100 units/mL injection INJECT 30 UNITS SUBCUTANEOUSLY THREE TIMES DAILY BEFORE MEAL(S)  . metoprolol succinate (TOPROL-XL) 50 MG 24 hr tablet Take 50 mg by mouth daily.  . Multiple Vitamin (MULTIVITAMIN) tablet Take 1 tablet by mouth daily.   . nitroGLYCERIN (NITROSTAT) 0.4 MG SL tablet Place 0.4 mg under the tongue every 5 (five) minutes as needed for chest pain.  . Omega-3 Fatty Acids (FISH OIL) 1000 MG CPDR Take 1 tablet by mouth daily.  Marland Kitchen omeprazole (PRILOSEC) 20 MG capsule TAKE ONE CAPSULE BY MOUTH TWICE DAILY BEFORE MEAL(S)  . quinapril (ACCUPRIL) 40 MG tablet Take 40 mg  by mouth at bedtime.   Marland Kitchen albuterol (PROVENTIL HFA;VENTOLIN HFA) 108 (90 Base) MCG/ACT inhaler Inhale 2 puffs into the lungs every 6 (six) hours as needed for wheezing or shortness of breath. (Patient not taking: Reported on 03/18/2017)  . [DISCONTINUED] allopurinol (ZYLOPRIM) 300 MG tablet TAKE 1/2 (ONE-HALF) TABLET BY MOUTH ONCE DAILY  . [DISCONTINUED] docusate sodium (COLACE) 100 MG capsule Take 100 mg by mouth 2 (two) times daily.  . [DISCONTINUED] Spacer/Aero Chamber Mouthpiece MISC 1 each by Does not apply route every 6 (six) hours as needed. (Patient not taking: Reported on 03/18/2017)   No facility-administered encounter medications on file as of 03/18/2017.     Allergies  (verified) Patient has no known allergies.   History: Past Medical History:  Diagnosis Date  . Adenomatous colon polyp 2006  . CAD (coronary artery disease)   . Calcium oxalate renal stones   . Cardiomyopathy   . Cataract   . Diabetes ([place])   . Erectile dysfunction   . Erosive esophagitis   . Hemorrhoids   . HLD (hyperlipidemia)   . HTN (hypertension)   . Hypertensive heart disease without CHF 07/31/2011  . ICD (implantable cardiac defibrillator) in place   . ICD dual chamber in situ   . Metabolic syndrome   . Morbid obesity ([place])   . Osteoarthritis   . S/P CABG (coronary artery bypass graft) 11/02/2000  . Sleep apnea    Past Surgical History:  Procedure Laterality Date  . ABDOMINAL EXPLORATION SURGERY    . BACK SURGERY     X'3  . cardiac bypass    . CARDIAC DEFIBRILLATOR PLACEMENT    . CARPAL TUNNEL RELEASE     X2, bilateral  . CATARACT EXTRACTION    . COLONOSCOPY  06/20/2012   Procedure: COLONOSCOPY;  Surgeon: Sable Feil, MD;  Location: [facility];  Service: Endoscopy;  Laterality: N/A;  . DOPPLER ECHOCARDIOGRAPHY  2003  . EP IMPLANTABLE DEVICE N/A 07/14/2016   Procedure: ICD Generator Changeout;  Surgeon: Evans Lance, MD;  Location: [facility];  Service: Cardiovascular;  Laterality: N/A;  . ESOPHAGOGASTRODUODENOSCOPY  06/20/2012   Procedure: ESOPHAGOGASTRODUODENOSCOPY (EGD);  Surgeon: Sable Feil, MD;  Location: [facility];  Service: Endoscopy;  Laterality: N/A;  . EYE SURGERY    . LAPAROTOMY    . RETINOPATHY SURGERY Bilateral   . rotator cuff surgery     left   Family History  Problem Relation Age of Onset  . Diabetes Brother   . Heart disease Father   . Heart disease Mother   . Diabetes Mother   . Diabetes Brother   . Diabetes Brother   . Cancer Brother        baldder  . Heart disease Brother   . Diabetes Sister   . Diabetes Sister   . Kidney disease Sister        dialysis  . Diabetes Sister   . Diabetes Sister   .  Diabetes Sister   . Throat cancer Paternal Uncle    Social History   Occupational History  . Retired from [employer] Retired   Social History Main Topics  . Smoking status: Never Smoker  . Smokeless tobacco: Never Used  . Alcohol use No  . Drug use: No  . Sexual activity: Yes    Do you feel safe at home?  Yes Are there smokers in your home (other than you)? No  Dietary issues and exercise activities discussed: Current Exercise Habits: The patient  does not participate in regular exercise at present  Current Dietary habits:  Wife cooks most meals .  Eats 3 meals per day - sometimes 1 snack.  Limits salt intake and CHO intake.    Cardiac Risk Factors include: advanced age (>36men, >57 women);diabetes mellitus;dyslipidemia;male gender;hypertension;obesity (BMI >30kg/m2);sedentary lifestyle  Objective:    Today's Vitals   03/18/17 0836  BP: 118/70  Pulse: 60  Weight: 240 lb (108.9 kg)  Height: 5\' 4"  (1.626 m)   Body mass index is 41.2 kg/m.   Activities of Daily Living In your present state of health, do you have any difficulty performing the following activities: 03/18/2017 07/14/2016  Hearing? N N  Vision? N N  Difficulty concentrating or making decisions? N N  Walking or climbing stairs? Y Y  Dressing or bathing? N N  Doing errands, shopping? N -  Preparing Food and eating ? N -  Using the Toilet? N -  In the past six months, have you accidently leaked urine? N -  Do you have problems with loss of bowel control? N -  Managing your Medications? N -  Managing your Finances? N -  Housekeeping or managing your Housekeeping? N -  Some recent data might be hidden     Depression Screen PHQ 2/9 Scores 03/18/2017 12/23/2016 11/20/2016 11/12/2016  PHQ - 2 Score 0 0 0 0     Fall Risk Fall Risk  03/18/2017 12/23/2016 11/20/2016 11/12/2016 09/23/2016  Falls in the past year? Yes Yes Yes Yes Yes  Number falls in past yr: 1 1 1 1 1   Injury with Fall? No Yes Yes Yes Yes   Risk Factor Category  - - - - -  Risk for fall due to : History of fall(s);Impaired balance/gait;Medication side effect;Impaired mobility History of fall(s);Impaired balance/gait Impaired mobility;History of fall(s) - History of fall(s)  Risk for fall due to (comments): - - - - -  Follow up Falls evaluation completed;Falls prevention discussed Falls prevention discussed Falls prevention discussed - Falls prevention discussed   Fall assessement:  Patient is at high risk of falls due to the following - neuropathy, gabapentin therapy, insulin therapy, DM, history of falls, decrease mobility / leg weakness.  Cognitive Function: MMSE - Mini Mental State Exam 03/18/2017 03/16/2016 03/01/2015  Orientation to time 5 5 5   Orientation to Place 5 5 5   Registration 3 3 3   Attention/ Calculation 3 4 3   Recall 3 3 3   Language- name 2 objects 2 2 2   Language- repeat 1 1 1   Language- follow 3 step command 3 3 3   Language- read & follow direction 1 1 1   Write a sentence 1 1 1   Copy design 1 1 1   Total score 28 29 28     Immunizations and Health Maintenance Immunization History  Administered Date(s) Administered  . Influenza,inj,Quad PF,36+ Mos 06/12/2014, 06/24/2015, 06/17/2016  . Pneumococcal Conjugate-13 03/01/2015  . Pneumococcal Polysaccharide-23 03/16/2016   Health Maintenance Due  Topic Date Due  . Samul Dada  01/17/1951    Patient Care Team: Eustaquio Maize, MD as PCP - General (Family Medicine) Regenia Skeeter, MD as Consulting Physician (Internal Medicine) Paulla Dolly Tamala Fothergill, DPM as Consulting Physician (Podiatry) Jacolyn Reedy, MD as Consulting Physician (Cardiology) Zadie Rhine Clent Demark, MD as Consulting Physician (Ophthalmology) Evans Lance, MD as Consulting Physician (Cardiology) Gardiner Barefoot, DPM as Consulting Physician (Podiatry)  Indicate any recent Medical Services you may have received from other than [facility] providers in the past year (date  may be approximate).      Assessment:    Annual Wellness Visit  Diabetes mellitus requiring insulin therapy High fall risk    Screening Tests Health Maintenance  Topic Date Due  . TETANUS/TDAP  01/17/1951  . INFLUENZA VACCINE  03/31/2017  . HEMOGLOBIN A1C  06/24/2017  . OPHTHALMOLOGY EXAM  09/10/2017  . FOOT EXAM  09/23/2017  . PNA vac Low Risk Adult  Completed        Plan:   During the course of the visit Hailey was educated and counseled about the following appropriate screening and preventive services:   Vaccines to include Pneumoccal, Influenza,  Td, and Shingles - due Boostrix / Tdap and shingrix but office if out of stock.  Plan to get in August at appt with PCP  Colorectal cancer screening - last 2013, per Dr Sharlett Iles 's notes did not recommend further colonoscopies unless patient having symptoms - explained to patient  Cardiovascular disease screening - UTD, last visit with cardiologist 1 month ago  Diabetes - discussed appropriate timing of Regular and NPH insulin dosing.  Patient is reminded that if will be more than 15 minutes to when he will eat a meal, then he is to given Regulat insulin after meal to prevent hypoglycemia.    Glaucoma screening / Diabetic Eye Exam - UTD  Nutrition counseling - reviewed low calorie foods - to increase lean proteins and non starchy vegetables.  Advanced Directives - copy requested  Physical Activity - referral sent for physical therapy - assess balance and recommend / education regarding ambulation assistance.  Then recommend restart [employer] program.  Appointment made for August to follow up with PCP    Patient Instructions (the written plan) were given to the patient.   Cherre Robins, PharmD   03/18/2017

## 2017-03-19 ENCOUNTER — Telehealth: Payer: Self-pay | Admitting: Pediatrics

## 2017-03-19 ENCOUNTER — Other Ambulatory Visit: Payer: Self-pay

## 2017-03-19 ENCOUNTER — Other Ambulatory Visit: Payer: Self-pay | Admitting: *Deleted

## 2017-03-19 MED ORDER — INSULIN REGULAR HUMAN 100 UNIT/ML IJ SOLN: INTRAMUSCULAR | 1 refills | Status: DC

## 2017-03-19 NOTE — Telephone Encounter (Signed)
Rx sent to correct pharmacy per patients request. Patient notified

## 2017-03-19 NOTE — Telephone Encounter (Signed)
Meds sent to correct pharmacy per patients request. Patient notified

## 2017-03-30 ENCOUNTER — Ambulatory Visit: Payer: Medicare Other | Attending: Pediatrics | Admitting: Physical Therapy

## 2017-03-30 DIAGNOSIS — M6281 Muscle weakness (generalized): Secondary | ICD-10-CM | POA: Diagnosis not present

## 2017-03-30 DIAGNOSIS — R2681 Unsteadiness on feet: Secondary | ICD-10-CM | POA: Diagnosis not present

## 2017-03-30 NOTE — Therapy (Signed)
[facility] [place], [place], [address] Phone: [phone]   Fax:  [phone]  Physical Therapy Evaluation  Patient Details  Name: Manuel Kline MRN: [id number] Date of Birth: [birth year] Referring Provider: Assunta Found MD.  Encounter Date: 03/30/2017      PT End of Session - 03/30/17 1621    Visit Number 1   Number of Visits 16   Date for PT Re-Evaluation 05/29/17   PT Start Time 0145   PT Stop Time 0234   PT Time Calculation (min) 49 min   Equipment Utilized During Treatment --  Constant supervision.   Activity Tolerance Patient tolerated treatment well   Behavior During Therapy [facility] for tasks assessed/performed      Past Medical History:  Diagnosis Date  . Adenomatous colon polyp 2006  . CAD (coronary artery disease)   . Calcium oxalate renal stones   . Cardiomyopathy   . Cataract   . Diabetes ([place])   . Erectile dysfunction   . Erosive esophagitis   . Hemorrhoids   . HLD (hyperlipidemia)   . HTN (hypertension)   . Hypertensive heart disease without CHF 07/31/2011  . ICD (implantable cardiac defibrillator) in place   . ICD dual chamber in situ   . Metabolic syndrome   . Morbid obesity ([place])   . Osteoarthritis   . S/P CABG (coronary artery bypass graft) 11/02/2000  . Sleep apnea     Past Surgical History:  Procedure Laterality Date  . ABDOMINAL EXPLORATION SURGERY    . BACK SURGERY     X'3  . cardiac bypass    . CARDIAC DEFIBRILLATOR PLACEMENT    . CARPAL TUNNEL RELEASE     X2, bilateral  . CATARACT EXTRACTION    . COLONOSCOPY  06/20/2012   Procedure: COLONOSCOPY;  Surgeon: Sable Feil, MD;  [facility];  Service: Endoscopy;  Laterality: N/A;  . DOPPLER ECHOCARDIOGRAPHY  2003  . EP IMPLANTABLE DEVICE N/A 07/14/2016   Procedure: ICD Generator Changeout;  Surgeon: Evans Lance, MD;  Location: [facility];  Service: Cardiovascular;  Laterality: N/A;  .  ESOPHAGOGASTRODUODENOSCOPY  06/20/2012   Procedure: ESOPHAGOGASTRODUODENOSCOPY (EGD);  Surgeon: Sable Feil, MD;  Location: [facility];  Service: Endoscopy;  Laterality: N/A;  . EYE SURGERY    . LAPAROTOMY    . RETINOPATHY SURGERY Bilateral   . rotator cuff surgery     left    There were no vitals filed for this visit.       Subjective Assessment - 03/30/17 1511    Subjective The patient presents to physical therapy with c/o falling.  He states his legs will get way. He states sometimes his right ankle rolls.  He wears high top diabetic shoes.  It is recommended he use at minimal a cane at all times.     Pertinent History Peripheral neuropathy.  Right Achilles rupture and repair as child.  Lumbar surgeries.   Patient Stated Goals Walk and not lose balance.   Currently in Pain? No/denies            [facility] PT Assessment - 03/30/17 0001      Assessment   Medical Diagnosis Fall risk.   Referring Provider Assunta Found MD.   Onset Date/Surgical Date --  "2 weeks."     Precautions   Precautions Fall     Restrictions   Weight Bearing Restrictions No     Balance Screen   Has the patient fallen  in the past 6 months Yes   How many times? --  3.   Has the patient had a decrease in activity level because of a fear of falling?  Yes   Is the patient reluctant to leave their home because of a fear of falling?  Yes     [place] residence     Prior Function   Level of Independence Independent     ROM / Strength   AROM / PROM / Strength AROM;Strength     AROM   Overall AROM Comments WFL for bilateral LE's.     Strength   Overall Strength Comments Right ankle dorsiflexion and everversion= 4-/5.     Special Tests    Special Tests --  (-)Romberg test but "wobbly."     Ambulation/Gait   Gait Comments Slow and purposeful with a wide BOS.     Standardized Balance Assessment   Standardized Balance Assessment Berg Balance Test      Berg Balance Test   Sit to Stand Able to stand  independently using hands   Standing Unsupported Able to stand 2 minutes with supervision   Sitting with Back Unsupported but Feet Supported on Floor or Stool Able to sit safely and securely 2 minutes   Stand to Sit Sits safely with minimal use of hands   Transfers Able to transfer safely, minor use of hands   Standing Unsupported with Eyes Closed Able to stand 10 seconds with supervision   Standing Ubsupported with Feet Together Able to place feet together independently but unable to hold for 30 seconds   From Standing, Reach Forward with Outstretched Arm Can reach forward >12 cm safely (5")   From Standing Position, Pick up Object from Floor Able to pick up shoe, needs supervision   From Standing Position, Turn to Look Behind Over each Shoulder Looks behind from both sides and weight shifts well   Turn 360 Degrees Able to turn 360 degrees safely one side only in 4 seconds or less   Standing Unsupported, Alternately Place Feet on Step/Stool Able to complete 4 steps without aid or supervision   Standing Unsupported, One Foot in Front Needs help to step but can hold 15 seconds   Standing on One Leg Tries to lift leg/unable to hold 3 seconds but remains standing independently   Total Score 40            Objective measurements completed on examination: See above findings.          [facility] Adult PT Treatment/Exercise - 03/30/17 0001      Exercises   Exercises Knee/Hip     Knee/Hip Exercises: Aerobic   Nustep Level 4 x 10 minutes.  02 sat= 98%.                  PT Short Term Goals - 03/30/17 1615      PT SHORT TERM GOAL #1   Title Berg score to 42/56.           PT Long Term Goals - 03/30/17 1619      PT LONG TERM GOAL #1   Title Improve Berg score to 48-49/56.   Time 8   Period Weeks   Status New     PT LONG TERM GOAL #2   Title No reports of falls.   Time 8   Period Weeks   Status New  Plan - 03/30/17 1612    Clinical Impression Statement The patient presents to OPPT with c/o 3 falls over the last 6 months but especially bad over the last 2 weeks.  His Berg score is a 40/56.  He is weak over his right ankle.  He need to be using at least a cane at all times.  Patient will benefit from skilled physical therapy to address balance deficits.   History and Personal Factors relevant to plan of care: Peripheral neuropathy.  Right Achilles rupture and repair as child.  Lumbar surgeries.   Clinical Presentation Evolving   Clinical Presentation due to: Balance is worsening.   Clinical Decision Making Low   Rehab Potential Good   PT Frequency 2x / week   PT Duration 8 weeks   PT Treatment/Interventions Therapeutic activities;Therapeutic exercise;Neuromuscular re-education;Patient/family education;Balance training   PT Next Visit Plan Balance training and general LE strenthening.   Consulted and Agree with Plan of Care Patient      Patient will benefit from skilled therapeutic intervention in order to improve the following deficits and impairments:  Abnormal gait, Decreased balance, Decreased mobility, Decreased strength  Visit Diagnosis: Unsteadiness on feet - Plan: PT plan of care cert/re-cert  Muscle weakness (generalized) - Plan: PT plan of care cert/re-cert      G-Codes - 75/64/33 1622    Functional Assessment Tool Used (Outpatient Only) Clinical judgement.   Functional Limitation Mobility: Walking and moving around   Mobility: Walking and Moving Around Current Status [phone]) At least 40 percent but less than 60 percent impaired, limited or restricted   Mobility: Walking and Moving Around Goal Status [phone]) At least 20 percent but less than 40 percent impaired, limited or restricted       Problem List Patient Active Problem List   Diagnosis Date Noted  . Ventricular tachycardia (paroxysmal) ([place]) 07/14/2016  . Essential hypertension 11/30/2015   . Diabetic peripheral neuropathy ([place]) 01/31/2015  . CKD (chronic kidney disease) stage 3, GFR 30-59 ml/min 11/05/2014  . OSA on CPAP 11/05/2014  . Severe obesity (BMI >= 40) ([place]) 11/05/2014  . CAD (coronary artery disease)   . Dual implantable cardioverter-defibrillator in situ   . Hyperlipidemia   . Type 2 diabetes, uncontrolled, with neuropathy ([place])   . Morbid obesity ([place])   . Ischemic cardiomyopathy   . Chronic systolic heart failure ([place])   . S/P CABG (coronary artery bypass graft) 11/02/2000    Nataliyah Packham, [place] MPT 03/30/2017, 4:24 PM  [facility] [place], [place], [address] Phone: [phone]   Fax:  [phone]  Name: THEUS ESPIN MRN: [id number] Date of Birth: [birth year]

## 2017-04-02 ENCOUNTER — Ambulatory Visit: Payer: Medicare Other

## 2017-04-05 ENCOUNTER — Other Ambulatory Visit: Payer: Self-pay | Admitting: Pediatrics

## 2017-04-06 ENCOUNTER — Ambulatory Visit: Payer: Medicare Other | Attending: Pediatrics | Admitting: Physical Therapy

## 2017-04-06 ENCOUNTER — Encounter: Payer: Self-pay | Admitting: Physical Therapy

## 2017-04-06 DIAGNOSIS — M6281 Muscle weakness (generalized): Secondary | ICD-10-CM

## 2017-04-06 DIAGNOSIS — R2681 Unsteadiness on feet: Secondary | ICD-10-CM | POA: Insufficient documentation

## 2017-04-06 NOTE — Therapy (Signed)
[facility] [place], [place], [address] Phone: [phone]   Fax:  [phone]  Physical Therapy Treatment  Patient Details  Name: Manuel Kline MRN: [id number] Date of Birth: 11-23-1931 Referring Provider: Assunta Found MD.  Encounter Date: 04/06/2017      PT End of Session - 04/06/17 1354    Visit Number 2   Number of Visits 16   Date for PT Re-Evaluation 05/29/17   PT Start Time 1346   PT Stop Time 1427   PT Time Calculation (min) 41 min   Activity Tolerance Patient tolerated treatment well   Behavior During Therapy [facility] for tasks assessed/performed      Past Medical History:  Diagnosis Date  . Adenomatous colon polyp 2006  . CAD (coronary artery disease)   . Calcium oxalate renal stones   . Cardiomyopathy   . Cataract   . Diabetes ([place])   . Erectile dysfunction   . Erosive esophagitis   . Hemorrhoids   . HLD (hyperlipidemia)   . HTN (hypertension)   . Hypertensive heart disease without CHF 07/31/2011  . ICD (implantable cardiac defibrillator) in place   . ICD dual chamber in situ   . Metabolic syndrome   . Morbid obesity ([place])   . Osteoarthritis   . S/P CABG (coronary artery bypass graft) 11/02/2000  . Sleep apnea     Past Surgical History:  Procedure Laterality Date  . ABDOMINAL EXPLORATION SURGERY    . BACK SURGERY     X'3  . cardiac bypass    . CARDIAC DEFIBRILLATOR PLACEMENT    . CARPAL TUNNEL RELEASE     X2, bilateral  . CATARACT EXTRACTION    . COLONOSCOPY  06/20/2012   Procedure: COLONOSCOPY;  Surgeon: Sable Feil, MD;  Location: [facility];  Service: Endoscopy;  Laterality: N/A;  . DOPPLER ECHOCARDIOGRAPHY  2003  . EP IMPLANTABLE DEVICE N/A 07/14/2016   Procedure: ICD Generator Changeout;  Surgeon: Evans Lance, MD;  Location: [place] [facility];  Service: Cardiovascular;  Laterality: N/A;  . ESOPHAGOGASTRODUODENOSCOPY  06/20/2012   Procedure: ESOPHAGOGASTRODUODENOSCOPY (EGD);   Surgeon: Sable Feil, MD;  Location: [facility];  Service: Endoscopy;  Laterality: N/A;  . EYE SURGERY    . LAPAROTOMY    . RETINOPATHY SURGERY Bilateral   . rotator cuff surgery     left    There were no vitals filed for this visit.      Subjective Assessment - 04/06/17 1353    Subjective Reports that he has a "hurting arm" that occurred following him driving to [place], [place] on saturday. Reports that the back of his legs just feel weak. No falls since evaluation but states that he doesn't wear emergency button necklace if his wife is home.   Pertinent History Peripheral neuropathy.  Right Achilles rupture and repair as child.  Lumbar surgeries.   Patient Stated Goals Walk and not lose balance.   Currently in Pain? Yes   Pain Score --  no pain score provided by patient   Pain Location Arm   Pain Orientation Left;Distal   Pain Descriptors / Indicators Discomfort;Other (Comment)  "pops"   Pain Type Acute pain   Pain Frequency Intermittent   Aggravating Factors  with supination            [facility] PT Assessment - 04/06/17 0001      Assessment   Medical Diagnosis Fall risk.     Precautions   Precautions Fall  Restrictions   Weight Bearing Restrictions No                     [facility] Adult PT Treatment/Exercise - 04/06/17 0001      Knee/Hip Exercises: Aerobic   Nustep L3 x15 min     Knee/Hip Exercises: Standing   Hip Abduction AROM;Both;2 sets;10 reps;Knee straight     Knee/Hip Exercises: Seated   Long Arc Quad Strengthening;Both;2 sets;10 reps;Weights   Long Arc Quad Weight 4 lbs.   Sit to Sand 15 reps;without UE support             Balance Exercises - 04/06/17 1427      Balance Exercises: Standing   Standing Eyes Opened Narrow base of support (BOS);Foam/compliant surface  x4 min with intermittant UE support   Standing Eyes Closed Narrow base of support (BOS);Solid surface  x4 min with intermittant UE support   Tandem Stance Eyes  open;Intermittent upper extremity support  on floor x4 min in semitandem   Other Standing Exercises DLS on horizontal beam with intermittant UE support x2 min             PT Short Term Goals - 03/30/17 1615      PT SHORT TERM GOAL #1   Title Berg score to 42/56.           PT Long Term Goals - 04/06/17 1401      PT LONG TERM GOAL #1   Title Improve Berg score to 48-49/56.   Time 8   Period Weeks   Status On-going     PT LONG TERM GOAL #2   Title No reports of falls.   Time 8   Period Weeks   Status On-going               Plan - 04/06/17 1428    Clinical Impression Statement Patient presented in clinic with only complaint of L lower arm popping and discomfort and lower legs feeling like "sponges." Patient able to be progressed through various LE strengthening exercises with only rest break being on Nustep following 10 minutes. Compliant surfaces as well as eyes closed demonstrated instability with patient but patient able to control ankle sway with intermittant UE support. Patient educated that safety button for safety even with his wife home.   Rehab Potential Good   PT Frequency 2x / week   PT Duration 8 weeks   PT Treatment/Interventions Therapeutic activities;Therapeutic exercise;Neuromuscular re-education;Patient/family education;Balance training   PT Next Visit Plan Balance training and general LE strenthening.   Consulted and Agree with Plan of Care Patient      Patient will benefit from skilled therapeutic intervention in order to improve the following deficits and impairments:  Abnormal gait, Decreased balance, Decreased mobility, Decreased strength  Visit Diagnosis: Unsteadiness on feet  Muscle weakness (generalized)     Problem List Patient Active Problem List   Diagnosis Date Noted  . Ventricular tachycardia (paroxysmal) ([place]) 07/14/2016  . Essential hypertension 11/30/2015  . Diabetic peripheral neuropathy ([place]) 01/31/2015  . CKD  (chronic kidney disease) stage 3, GFR 30-59 ml/min 11/05/2014  . OSA on CPAP 11/05/2014  . Severe obesity (BMI >= 40) ([place]) 11/05/2014  . CAD (coronary artery disease)   . Dual implantable cardioverter-defibrillator in situ   . Hyperlipidemia   . Type 2 diabetes, uncontrolled, with neuropathy ([place])   . Morbid obesity ([place])   . Ischemic cardiomyopathy   . Chronic systolic heart failure ([place])   . S/P  CABG (coronary artery bypass graft) 11/02/2000    Wynelle Fanny, PTA 04/06/2017, 2:31 PM  [facility] [address] [place], [place], [address] Phone: [phone]   Fax:  [phone]  Name: Manuel Kline MRN: [id number] Date of Birth: 06-13-1932

## 2017-04-08 ENCOUNTER — Encounter: Payer: Self-pay | Admitting: Physical Therapy

## 2017-04-08 ENCOUNTER — Ambulatory Visit: Payer: Medicare Other | Admitting: Physical Therapy

## 2017-04-08 DIAGNOSIS — M6281 Muscle weakness (generalized): Secondary | ICD-10-CM | POA: Diagnosis not present

## 2017-04-08 DIAGNOSIS — R2681 Unsteadiness on feet: Secondary | ICD-10-CM | POA: Diagnosis not present

## 2017-04-08 NOTE — Therapy (Addendum)
[facility] [place], [place], [address] Phone: [phone]   Fax:  [phone]  Physical Therapy Treatment  Patient Details  Name: Manuel Kline MRN: [id number] Date of Birth: 20-Nov-1931 Referring Provider: Assunta Found MD.  Encounter Date: 04/08/2017      PT End of Session - 04/08/17 1415    Visit Number 3   Number of Visits 16   Date for PT Re-Evaluation 05/29/17   PT Start Time 9242   PT Stop Time 1427   PT Time Calculation (min) 42 min   Activity Tolerance Patient tolerated treatment well   Behavior During Therapy [facility] for tasks assessed/performed      Past Medical History:  Diagnosis Date  . Adenomatous colon polyp 2006  . CAD (coronary artery disease)   . Calcium oxalate renal stones   . Cardiomyopathy   . Cataract   . Diabetes ([place])   . Erectile dysfunction   . Erosive esophagitis   . Hemorrhoids   . HLD (hyperlipidemia)   . HTN (hypertension)   . Hypertensive heart disease without CHF 07/31/2011  . ICD (implantable cardiac defibrillator) in place   . ICD dual chamber in situ   . Metabolic syndrome   . Morbid obesity ([place])   . Osteoarthritis   . S/P CABG (coronary artery bypass graft) 11/02/2000  . Sleep apnea     Past Surgical History:  Procedure Laterality Date  . ABDOMINAL EXPLORATION SURGERY    . BACK SURGERY     X'3  . cardiac bypass    . CARDIAC DEFIBRILLATOR PLACEMENT    . CARPAL TUNNEL RELEASE     X2, bilateral  . CATARACT EXTRACTION    . COLONOSCOPY  06/20/2012   Procedure: COLONOSCOPY;  Surgeon: Sable Feil, MD;  [facility]: [facility];  Service: Endoscopy;  Laterality: N/A;  . DOPPLER ECHOCARDIOGRAPHY  2003  . EP IMPLANTABLE DEVICE N/A 07/14/2016   Procedure: ICD Generator Changeout;  Surgeon: Evans Lance, MD;  Location: [facility];  Service: Cardiovascular;  Laterality: N/A;  . ESOPHAGOGASTRODUODENOSCOPY  06/20/2012   Procedure: ESOPHAGOGASTRODUODENOSCOPY (EGD);   Surgeon: Sable Feil, MD;  Location: [facility];  Service: Endoscopy;  Laterality: N/A;  . EYE SURGERY    . LAPAROTOMY    . RETINOPATHY SURGERY Bilateral   . rotator cuff surgery     left    There were no vitals filed for this visit.      Subjective Assessment - 04/08/17 1351    Subjective No falls reported today yet past two days feeling weak.   Pertinent History Peripheral neuropathy.  Right Achilles rupture and repair as child.  Lumbar surgeries.   Patient Stated Goals Walk and not lose balance.   Currently in Pain? Other (Comment)  none reported                         [facility] Adult PT Treatment/Exercise - 04/08/17 0001      Knee/Hip Exercises: Aerobic   Nustep L4 x15 min UE/LE     Knee/Hip Exercises: Standing   Hip Abduction AROM;Both;2 sets;10 reps;Knee straight     Knee/Hip Exercises: Seated   Long Arc Quad Strengthening;Both;2 sets;10 reps;Weights   Long Arc Quad Weight 4 lbs.     Knee/Hip Exercises: Supine   Bridges Limitations x10   Straight Leg Raises Strengthening;Both;2 sets;10 reps   Other Supine Knee/Hip Exercises hip abd with green band x30  Balance Exercises - 04/08/17 1410      Balance Exercises: Standing   Standing Eyes Opened Wide (BOA);Foam/compliant surface;Time  10mn   Standing, One Foot on a Step 4 inch;Eyes open;4 reps;10 secs   Marching Limitations x10   Heel Raises Limitations x10   Toe Raise Limitations x10   Sit to Stand Time x10             PT Short Term Goals - 03/30/17 1615      PT SHORT TERM GOAL #1   Title Berg score to 42/56.           PT Long Term Goals - 04/08/17 1416      PT LONG TERM GOAL #1   Title Improve Berg score to 48-49/56.   Time 8   Period Weeks   Status On-going     PT LONG TERM GOAL #2   Title No reports of falls.   Time 8   Period Weeks   Status On-going               Plan - 04/08/17 1425    Clinical Impression Statement Patient tolerated  treatment well today. Patient able to progress with strengthening and balance activities. Patient reported some overall fatigue today for unkown reason yet able to complete all exercises. Patient required CGA/SBA for balance exercises. Patient progressing toward goals. strength and balance deficits ongoing.   Rehab Potential Good   PT Frequency 2x / week   PT Duration 8 weeks   PT Treatment/Interventions Therapeutic activities;Therapeutic exercise;Neuromuscular re-education;Patient/family education;Balance training   PT Next Visit Plan Balance training and general LE strenthening.   PT Home Exercise Plan issue HEP next treatment   Consulted and Agree with Plan of Care Patient      Patient will benefit from skilled therapeutic intervention in order to improve the following deficits and impairments:  Abnormal gait, Decreased balance, Decreased mobility, Decreased strength  Visit Diagnosis: Unsteadiness on feet  Muscle weakness (generalized)     Problem List Patient Active Problem List   Diagnosis Date Noted  . Ventricular tachycardia (paroxysmal) ([place] 07/14/2016  . Essential hypertension 11/30/2015  . Diabetic peripheral neuropathy ([place] 01/31/2015  . CKD (chronic kidney disease) stage 3, GFR 30-59 ml/min 11/05/2014  . OSA on CPAP 11/05/2014  . Severe obesity (BMI >= 40) ([place] 11/05/2014  . CAD (coronary artery disease)   . Dual implantable cardioverter-defibrillator in situ   . Hyperlipidemia   . Type 2 diabetes, uncontrolled, with neuropathy ([place]   . Morbid obesity ([place]   . Ischemic cardiomyopathy   . Chronic systolic heart failure ([place]   . S/P CABG (coronary artery bypass graft) 11/02/2000    Manuel Kline P, PTA 04/08/2017, 2:33 PM  [facility] [address]Phone: [phone]  Fax:  [phone] Name: Manuel TRAUTMANMRN: [id number] of Birth: 507-Aug-1933 PHYSICAL THERAPY DISCHARGE  SUMMARY  Visits from Start of Care: 3.  Current functional level related to goals / functional outcomes: See above.   Remaining deficits: See below.   Education / Equipment: HEP. Plan: Patient agrees to discharge.  Patient goals were not met. Patient is being discharged due to not returning since the last visit.  ?????         CMaliApplegate MPT

## 2017-04-15 DIAGNOSIS — Z95810 Presence of automatic (implantable) cardiac defibrillator: Secondary | ICD-10-CM | POA: Diagnosis not present

## 2017-04-19 ENCOUNTER — Ambulatory Visit (INDEPENDENT_AMBULATORY_CARE_PROVIDER_SITE_OTHER): Payer: Medicare Other | Admitting: Pediatrics

## 2017-04-19 ENCOUNTER — Encounter: Payer: Self-pay | Admitting: Pediatrics

## 2017-04-19 VITALS — BP 125/73 | HR 60 | Temp 97.0°F | Ht 64.0 in | Wt 242.0 lb

## 2017-04-19 DIAGNOSIS — H35041 Retinal micro-aneurysms, unspecified, right eye: Secondary | ICD-10-CM | POA: Diagnosis not present

## 2017-04-19 DIAGNOSIS — H3561 Retinal hemorrhage, right eye: Secondary | ICD-10-CM | POA: Diagnosis not present

## 2017-04-19 DIAGNOSIS — H65112 Acute and subacute allergic otitis media (mucoid) (sanguinous) (serous), left ear: Secondary | ICD-10-CM | POA: Diagnosis not present

## 2017-04-19 DIAGNOSIS — R062 Wheezing: Secondary | ICD-10-CM | POA: Diagnosis not present

## 2017-04-19 DIAGNOSIS — K219 Gastro-esophageal reflux disease without esophagitis: Secondary | ICD-10-CM

## 2017-04-19 DIAGNOSIS — I10 Essential (primary) hypertension: Secondary | ICD-10-CM

## 2017-04-19 DIAGNOSIS — E1165 Type 2 diabetes mellitus with hyperglycemia: Secondary | ICD-10-CM | POA: Diagnosis not present

## 2017-04-19 DIAGNOSIS — N183 Chronic kidney disease, stage 3 unspecified: Secondary | ICD-10-CM

## 2017-04-19 DIAGNOSIS — E1140 Type 2 diabetes mellitus with diabetic neuropathy, unspecified: Secondary | ICD-10-CM | POA: Diagnosis not present

## 2017-04-19 DIAGNOSIS — Z8739 Personal history of other diseases of the musculoskeletal system and connective tissue: Secondary | ICD-10-CM | POA: Diagnosis not present

## 2017-04-19 DIAGNOSIS — E785 Hyperlipidemia, unspecified: Secondary | ICD-10-CM

## 2017-04-19 DIAGNOSIS — IMO0002 Reserved for concepts with insufficient information to code with codable children: Secondary | ICD-10-CM

## 2017-04-19 DIAGNOSIS — H26491 Other secondary cataract, right eye: Secondary | ICD-10-CM | POA: Diagnosis not present

## 2017-04-19 DIAGNOSIS — E113393 Type 2 diabetes mellitus with moderate nonproliferative diabetic retinopathy without macular edema, bilateral: Secondary | ICD-10-CM | POA: Diagnosis not present

## 2017-04-19 LAB — BAYER DCA HB A1C WAIVED: HB A1C: 7.6 % — AB (ref <7.0)

## 2017-04-19 MED ORDER — ALBUTEROL SULFATE HFA 108 (90 BASE) MCG/ACT IN AERS: 2.0000 | INHALATION_SPRAY | Freq: Four times a day (QID) | RESPIRATORY_TRACT | 2 refills | Status: DC | PRN

## 2017-04-19 MED ORDER — GABAPENTIN 300 MG PO CAPS: 300.0000 mg | ORAL_CAPSULE | Freq: Two times a day (BID) | ORAL | 2 refills | Status: DC

## 2017-04-19 MED ORDER — INSULIN NPH (HUMAN) (ISOPHANE) 100 UNIT/ML ~~LOC~~ SUSP: SUBCUTANEOUS | 3 refills | Status: DC

## 2017-04-19 MED ORDER — OMEPRAZOLE 20 MG PO CPDR: DELAYED_RELEASE_CAPSULE | ORAL | 5 refills | Status: DC

## 2017-04-19 MED ORDER — INSULIN REGULAR HUMAN 100 UNIT/ML IJ SOLN: INTRAMUSCULAR | 1 refills | Status: DC

## 2017-04-19 MED ORDER — ALLOPURINOL 300 MG PO TABS: ORAL_TABLET | ORAL | 1 refills | Status: DC

## 2017-04-19 MED ORDER — AMOXICILLIN 500 MG PO CAPS: 500.0000 mg | ORAL_CAPSULE | Freq: Two times a day (BID) | ORAL | 0 refills | Status: DC

## 2017-04-19 MED ORDER — GLUCOSE BLOOD VI STRP: ORAL_STRIP | 12 refills | Status: DC

## 2017-04-19 NOTE — Progress Notes (Signed)
Subjective:   Patient ID: [name], male    DOB: February 01, 1932, 83 y.o.   MRN: [id number] CC: Follow-up (3 month); Ear Pain (Left); and Extremity Weakness  HPI: Manuel Kline is a 83 y.o. male presenting for Follow-up (3 month); Ear Pain (Left); and Extremity Weakness  DM2:  Has appt for eye exam today with Dr. Zadie Rhine.   Checking BGLs at home, 100s-150s Sometimes 80s-90s Taking 40u NPH in AM, 35u in PM Taking 30 u regular insulin before breakfast and dinner  Leg weakness: has been in PT Thinks helping some but still feels weak Says that they recommended he might use a cane to feel more stable  L ear pain has been ongoing for a few days No fevers  HTN: no CP, no SOB  Relevant past medical, surgical, family and social history reviewed. Allergies and medications reviewed and updated. History  Smoking Status  . Never Smoker  Smokeless Tobacco  . Never Used   ROS: Per HPI   Objective:    BP 125/73   Pulse 60   Temp (!) 97 F (36.1 C) (Oral)   Ht 5\' 4"  (1.626 m)   Wt 242 lb (109.8 kg)   BMI 41.54 kg/m   Wt Readings from Last 3 Encounters:  04/19/17 242 lb (109.8 kg)  03/18/17 240 lb (108.9 kg)  12/23/16 239 lb 3.2 oz (108.5 kg)    Gen: NAD, alert, cooperative with exam, NCAT EYES: EOMI, no conjunctival injection, or no icterus ENT: L TM red, R TM nl gray, OP without erythema LYMPH: no cervical LAD CV: NRRR, normal S1/S2, no murmur, distal pulses 2+ b/l Resp: CTABL, no wheezes, normal WOB Abd: +BS, soft, NTND Ext: No edema, warm Neuro: Alert and oriented, strength equal b/l knee ext/flex, sensation intact b/l legs  Assessment & Plan:  Manuel Kline was seen today for follow-up, ear pain and extremity weakness.  Diagnoses and all orders for this visit:  Ext weakness Continue PT Use cane if recommended by PT Take BGL if feeling weak  Type 2 diabetes, uncontrolled, with neuropathy (HCC) A1c today 7.6, some low BGLs at lunch Decrease to 35u NPH BID Take 30u reg insulin  with meals -     Bayer DCA Hb A1c Waived -     insulin NPH Human (HUMULIN N) 100 UNIT/ML injection; INJECT 35 UNITS BID -     insulin regular (NOVOLIN R RELION) 100 units/mL injection; INJECT 30 UNITS SUBCUTANEOUSLY THREE TIMES DAILY BEFORE MEAL(S) -     gabapentin (NEURONTIN) 300 MG capsule; Take 1 capsule (300 mg total) by mouth 2 (two) times daily.  Wheezing Normal exam today Take below prn -     albuterol (PROVENTIL HFA;VENTOLIN HFA) 108 (90 Base) MCG/ACT inhaler; Inhale 2 puffs into the lungs every 6 (six) hours as needed for wheezing or shortness of breath.  Hyperlipidemia, unspecified hyperlipidemia type Stable, cont statin  CKD (chronic kidney disease) stage 3, GFR 30-59 ml/min Follows with nephrology  Essential hypertension Well controlled today, cont  -     Basic Metabolic Panel  Severe obesity (BMI >= 40) (HCC) Cont avoiding sugar  Gastroesophageal reflux disease, esophagitis presence not specified Stable, symptoms OK with once daily below -     omeprazole (PRILOSEC) 20 MG capsule; TAKE ONE CAPSULE BY MOUTH once DAILY BEFORE MEAL(S)  H/O: gout Stable, no recent flares, cont below -     allopurinol (ZYLOPRIM) 300 MG tablet; TAKE 1/2 (ONE-HALF) TABLET BY MOUTH ONCE DAILY  Acute mucoid otitis  media of left ear Start below -     amoxicillin (AMOXIL) 500 MG capsule; Take 1 capsule (500 mg total) by mouth 2 (two) times daily.  Follow up plan: 3 mo, sooner if needed Manuel Found, MD [place]

## 2017-04-19 NOTE — Patient Instructions (Signed)
Take 35 u of NPH twice a day (long acting) Take 30u before meals (short acting for meal insulin)

## 2017-04-20 ENCOUNTER — Telehealth: Payer: Self-pay | Admitting: Pediatrics

## 2017-04-20 DIAGNOSIS — H65112 Acute and subacute allergic otitis media (mucoid) (sanguinous) (serous), left ear: Secondary | ICD-10-CM

## 2017-04-20 LAB — BASIC METABOLIC PANEL
BUN / CREAT RATIO: 14 (ref 10–24)
BUN: 35 mg/dL — ABNORMAL HIGH (ref 8–27)
CHLORIDE: 95 mmol/L — AB (ref 96–106)
CO2: 24 mmol/L (ref 20–29)
Calcium: 9.5 mg/dL (ref 8.6–10.2)
Creatinine, Ser: 2.5 mg/dL — ABNORMAL HIGH (ref 0.76–1.27)
GFR calc Af Amer: 26 mL/min/{1.73_m2} — ABNORMAL LOW (ref >59)
GFR calc non Af Amer: 23 mL/min/{1.73_m2} — ABNORMAL LOW (ref >59)
GLUCOSE: 156 mg/dL — AB (ref 65–99)
Potassium: 5.3 mmol/L — ABNORMAL HIGH (ref 3.5–5.2)
SODIUM: 135 mmol/L (ref 134–144)

## 2017-04-20 MED ORDER — AMOXICILLIN 500 MG PO CAPS: 500.0000 mg | ORAL_CAPSULE | Freq: Two times a day (BID) | ORAL | 0 refills | Status: DC

## 2017-04-20 NOTE — Telephone Encounter (Signed)
Amoxicillin rx sent to the [facility] in [place] and pt is aware.

## 2017-05-24 ENCOUNTER — Other Ambulatory Visit: Payer: Self-pay | Admitting: Pediatrics

## 2017-06-14 ENCOUNTER — Ambulatory Visit (INDEPENDENT_AMBULATORY_CARE_PROVIDER_SITE_OTHER): Payer: Medicare Other

## 2017-06-14 DIAGNOSIS — Z23 Encounter for immunization: Secondary | ICD-10-CM

## 2017-06-15 IMAGING — CR DG RIBS W/ CHEST 3+V*R*
3 series · 3 of 3 positions shown · non-contrast
Comparison: PA and lateral chest x-ray November 04, 2009

CLINICAL DATA: Right rib pain without known injury, history of
coronary artery disease, diabetes, and morbid obesity.

EXAM:
RIGHT RIBS AND CHEST - 3+ VIEW

[view not recorded (1 of 3)]
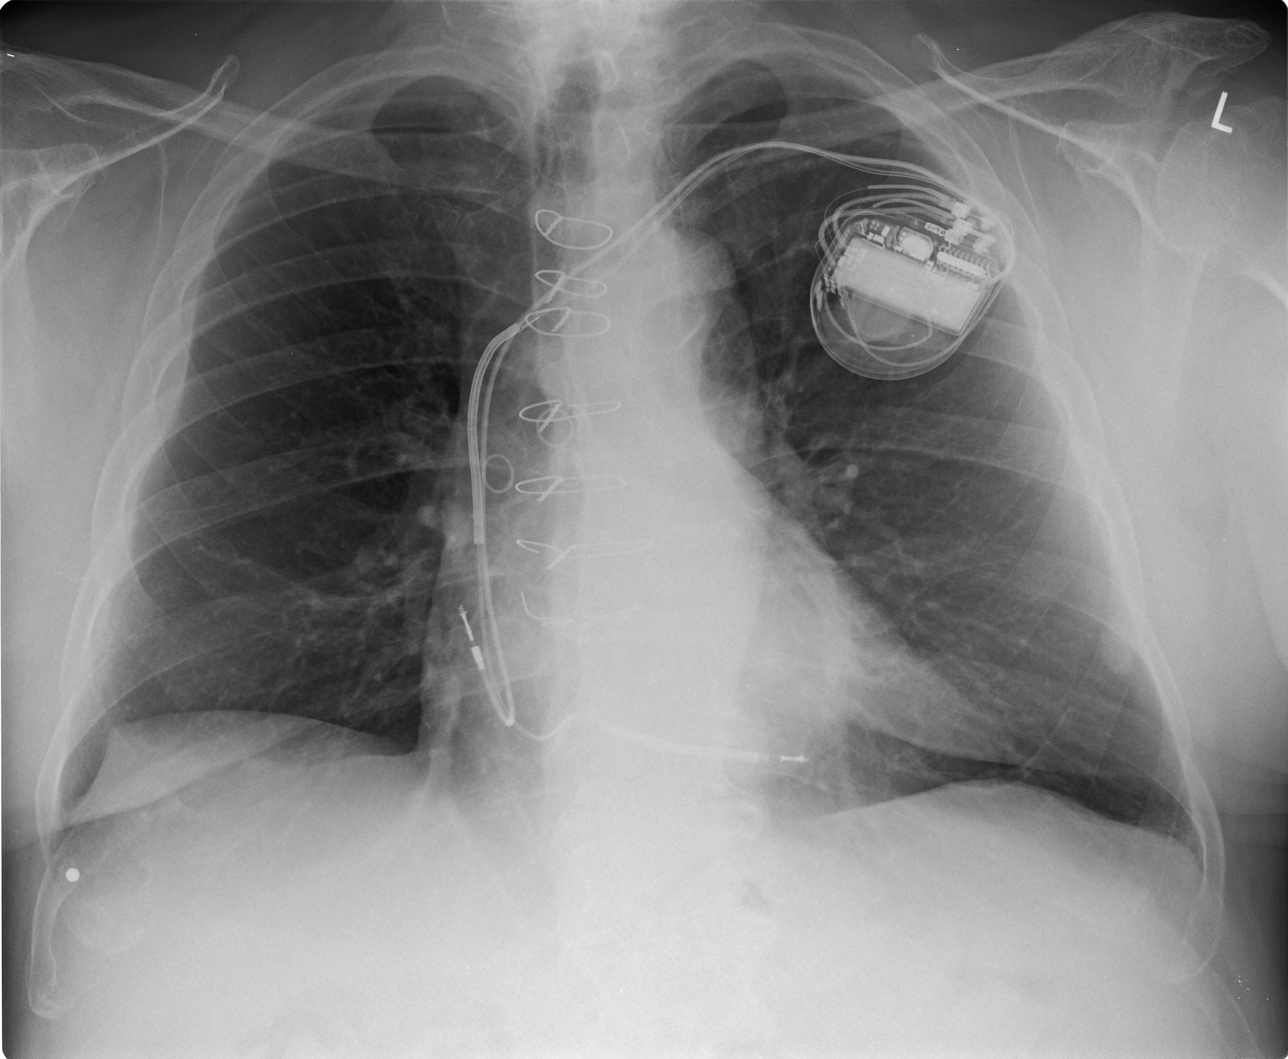

[view not recorded (2 of 3)]
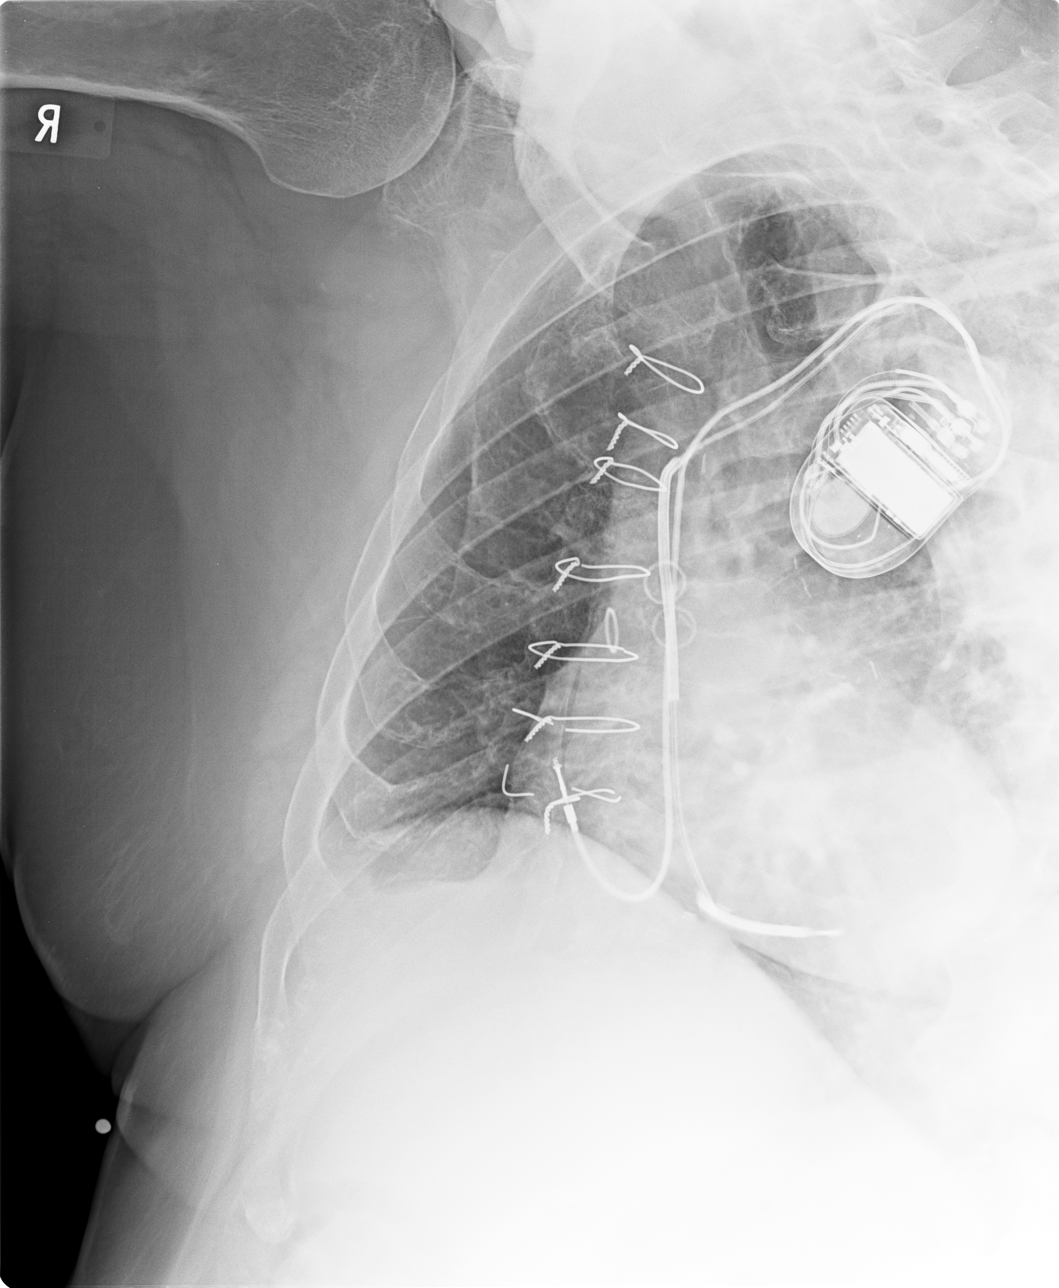

[view not recorded (3 of 3)]
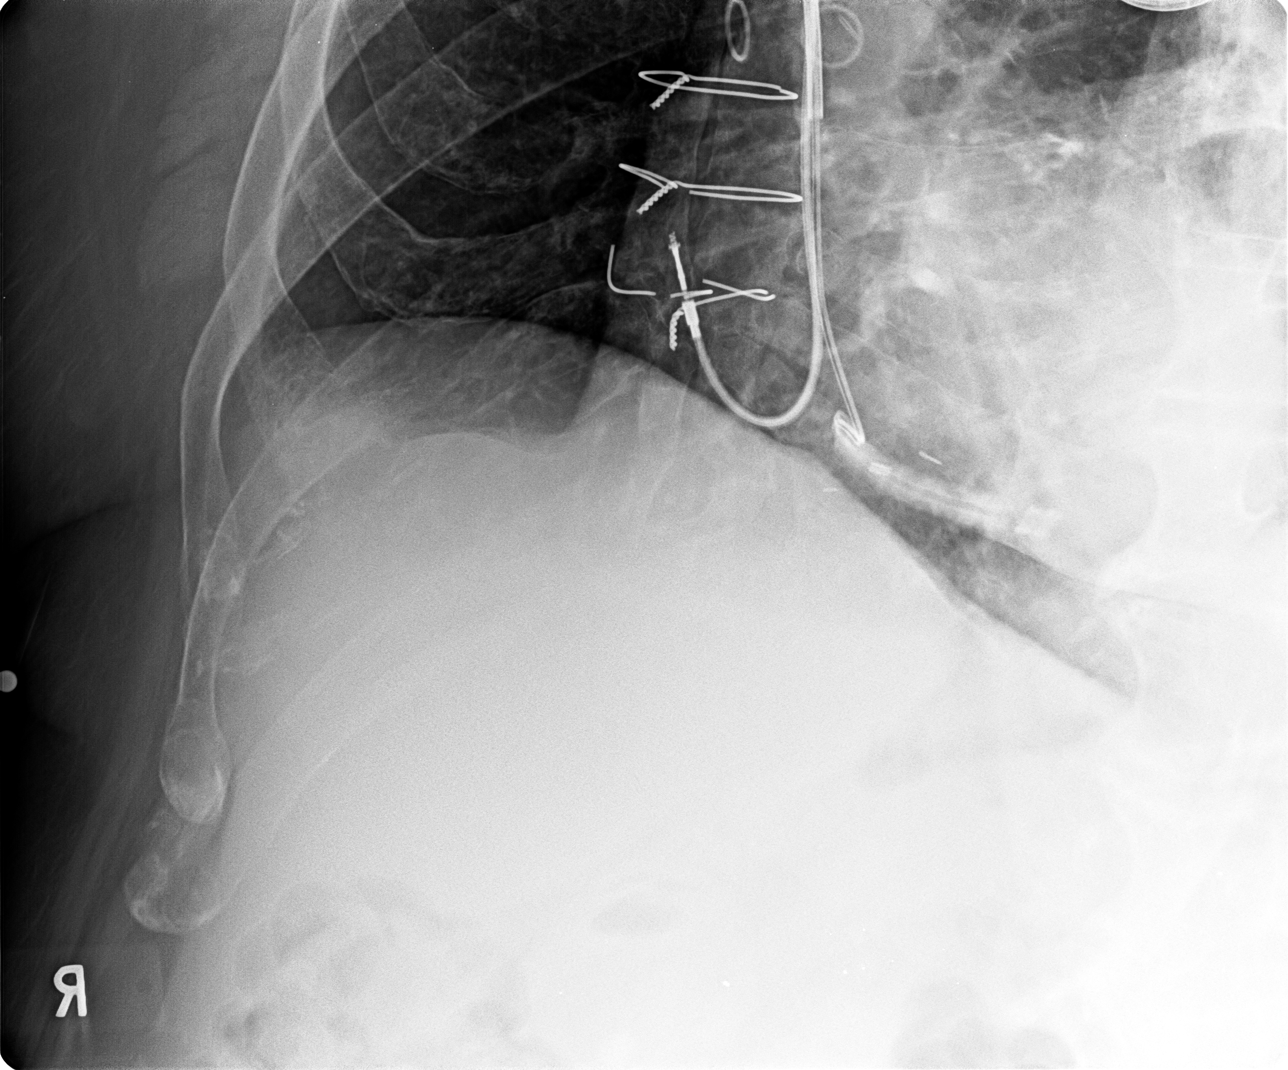

[3 of 3 positions shown; findings below may reference images not displayed]

FINDINGS: The lungs are adequately inflated. There is no focal infiltrate.
There is no pleural effusion or pneumothorax. There are post CABG
changes. There are multiple broken sternal wires. The AICD is
unchanged in position. The heart is normal in size. The mediastinum
is normal in width.

A metallic BB has been placed over the lower right ribs. The ribs
appear adequately mineralized. No acute displaced fracture is
demonstrated. No periosteal reaction is noted.
IMPRESSION: 1. There is no active cardiopulmonary disease.
2. No acute right rib fracture is demonstrated.

## 2017-07-15 DIAGNOSIS — Z95810 Presence of automatic (implantable) cardiac defibrillator: Secondary | ICD-10-CM | POA: Diagnosis not present

## 2017-08-06 ENCOUNTER — Other Ambulatory Visit: Payer: Self-pay | Admitting: Pediatrics

## 2017-08-06 DIAGNOSIS — E1165 Type 2 diabetes mellitus with hyperglycemia: Principal | ICD-10-CM

## 2017-08-06 DIAGNOSIS — E1140 Type 2 diabetes mellitus with diabetic neuropathy, unspecified: Secondary | ICD-10-CM

## 2017-08-06 DIAGNOSIS — IMO0002 Reserved for concepts with insufficient information to code with codable children: Secondary | ICD-10-CM

## 2017-08-11 DIAGNOSIS — N184 Chronic kidney disease, stage 4 (severe): Secondary | ICD-10-CM | POA: Diagnosis not present

## 2017-08-13 ENCOUNTER — Encounter: Payer: Medicare Other | Admitting: Internal Medicine

## 2017-08-16 ENCOUNTER — Other Ambulatory Visit: Payer: Self-pay | Admitting: Pediatrics

## 2017-08-16 DIAGNOSIS — IMO0002 Reserved for concepts with insufficient information to code with codable children: Secondary | ICD-10-CM

## 2017-08-16 DIAGNOSIS — E1165 Type 2 diabetes mellitus with hyperglycemia: Principal | ICD-10-CM

## 2017-08-16 DIAGNOSIS — E1140 Type 2 diabetes mellitus with diabetic neuropathy, unspecified: Secondary | ICD-10-CM

## 2017-08-23 ENCOUNTER — Encounter: Payer: Medicare Other | Admitting: Internal Medicine

## 2017-09-07 DIAGNOSIS — I5022 Chronic systolic (congestive) heart failure: Secondary | ICD-10-CM | POA: Diagnosis not present

## 2017-09-07 DIAGNOSIS — I2510 Atherosclerotic heart disease of native coronary artery without angina pectoris: Secondary | ICD-10-CM | POA: Diagnosis not present

## 2017-09-07 DIAGNOSIS — I119 Hypertensive heart disease without heart failure: Secondary | ICD-10-CM | POA: Diagnosis not present

## 2017-09-07 DIAGNOSIS — I255 Ischemic cardiomyopathy: Secondary | ICD-10-CM | POA: Diagnosis not present

## 2017-09-10 ENCOUNTER — Encounter: Payer: Self-pay | Admitting: *Deleted

## 2017-09-29 ENCOUNTER — Encounter: Payer: Self-pay | Admitting: Internal Medicine

## 2017-09-29 ENCOUNTER — Ambulatory Visit: Payer: Medicare Other | Admitting: Internal Medicine

## 2017-09-29 VITALS — BP 122/64 | HR 60 | Ht 64.0 in | Wt 244.6 lb

## 2017-09-29 DIAGNOSIS — I255 Ischemic cardiomyopathy: Secondary | ICD-10-CM

## 2017-09-29 DIAGNOSIS — Z95810 Presence of automatic (implantable) cardiac defibrillator: Secondary | ICD-10-CM | POA: Diagnosis not present

## 2017-09-29 DIAGNOSIS — I472 Ventricular tachycardia: Secondary | ICD-10-CM | POA: Diagnosis not present

## 2017-09-29 DIAGNOSIS — I4729 Other ventricular tachycardia: Secondary | ICD-10-CM

## 2017-09-29 DIAGNOSIS — I5022 Chronic systolic (congestive) heart failure: Secondary | ICD-10-CM

## 2017-09-29 NOTE — Patient Instructions (Signed)
Medication Instructions:  Your physician recommends that you continue on your current medications as directed. Please refer to the Current Medication list given to you today.  Labwork: None ordered.  Testing/Procedures: None ordered.  Follow-Up: Your physician wants you to follow-up in: one year with Dr. Lovena Le.   You will receive a reminder letter in the mail two months in advance. If you don't receive a letter, please call our office to schedule the follow-up appointment.  Dr. Wynonia Lawman follows your home remote monitoring for your device.  Any Other Special Instructions Will Be Listed Below (If Applicable).  If you need a refill on your cardiac medications before your next appointment, please call your pharmacy.

## 2017-09-29 NOTE — Progress Notes (Signed)
HPI Manuel Kline returns today for followup. He is a pleasant 84 yo man with chronic systolic heart failure, VT, obesity, HTN and ICD insertion. In the interim he denies chest pain, sob, or syncope. No ICD shock. No Known Allergies   Current Outpatient Medications  Medication Sig Dispense Refill  . albuterol (PROVENTIL HFA;VENTOLIN HFA) 108 (90 Base) MCG/ACT inhaler Inhale 2 puffs into the lungs every 6 (six) hours as needed for wheezing or shortness of breath. 1 Inhaler 2  . allopurinol (ZYLOPRIM) 300 MG tablet TAKE 1/2 (ONE-HALF) TABLET BY MOUTH ONCE DAILY 45 tablet 1  . amoxicillin (AMOXIL) 500 MG capsule Take 1 capsule (500 mg total) by mouth 2 (two) times daily. 14 capsule 0  . aspirin 81 MG tablet Take 81 mg by mouth daily.     Marland Kitchen atorvastatin (LIPITOR) 80 MG tablet Take 40 mg by mouth daily.    . cloNIDine (CATAPRES) 0.1 MG tablet Take 1 tablet by mouth 2 (two) times daily.    . Cyanocobalamin (VITAMIN B 12 PO) Take 1,000 mcg by mouth daily.    . fluticasone (FLONASE) 50 MCG/ACT nasal spray Place 1 spray into both nostrils 2 (two) times daily as needed for allergies or rhinitis. 16 g 6  . furosemide (LASIX) 40 MG tablet Take 1 tablet by mouth 2 (two) times daily.    Marland Kitchen gabapentin (NEURONTIN) 300 MG capsule Take 1 capsule (300 mg total) by mouth 2 (two) times daily. 180 capsule 2  . glucose blood test strip Use to check blood glucose 4 times daily 400 each 0  . HUMULIN N 100 UNIT/ML injection INJECT 40 UNITS SUBCUTANEOUSLY TWICE DAILY BEFORE MEALS 30 mL 1  . insulin NPH Human (HUMULIN N) 100 UNIT/ML injection INJECT 35 UNITS BID 30 mL 3  . metoprolol succinate (TOPROL-XL) 50 MG 24 hr tablet Take 50 mg by mouth daily.    . Multiple Vitamin (MULTIVITAMIN) tablet Take 1 tablet by mouth daily.     . nitroGLYCERIN (NITROSTAT) 0.4 MG SL tablet Place 0.4 mg under the tongue every 5 (five) minutes as needed for chest pain.    Marland Kitchen NOVOLIN R RELION 100 UNIT/ML injection INJECT 30 UNITS  SUBCUTANEOUSLY THREE TIMES DAILY BEFORE MEALS 30 mL 1  . Omega-3 Fatty Acids (FISH OIL) 1000 MG CPDR Take 1 tablet by mouth daily.    Marland Kitchen omeprazole (PRILOSEC) 20 MG capsule TAKE ONE CAPSULE BY MOUTH once DAILY BEFORE MEAL(S) 90 capsule 5  . quinapril (ACCUPRIL) 40 MG tablet Take 40 mg by mouth at bedtime.      No current facility-administered medications for this visit.      Past Medical History:  Diagnosis Date  . Adenomatous colon polyp 2006  . CAD (coronary artery disease)   . Calcium oxalate renal stones   . Cardiomyopathy   . Cataract   . Diabetes ([place])   . Erectile dysfunction   . Erosive esophagitis   . Hemorrhoids   . HLD (hyperlipidemia)   . HTN (hypertension)   . Hypertensive heart disease without CHF 07/31/2011  . ICD (implantable cardiac defibrillator) in place   . ICD dual chamber in situ   . Metabolic syndrome   . Morbid obesity ([place])   . Osteoarthritis   . S/P CABG (coronary artery bypass graft) 11/02/2000  . Sleep apnea     ROS:   All systems reviewed and negative except as noted in the HPI.   Past Surgical History:  Procedure Laterality Date  .  ABDOMINAL EXPLORATION SURGERY    . BACK SURGERY     X'3  . cardiac bypass    . CARDIAC DEFIBRILLATOR PLACEMENT    . CARPAL TUNNEL RELEASE     X2, bilateral  . CATARACT EXTRACTION    . COLONOSCOPY  06/20/2012   Procedure: COLONOSCOPY;  Surgeon: Sable Feil, MD;  Location: [facility];  Service: Endoscopy;  Laterality: N/A;  . DOPPLER ECHOCARDIOGRAPHY  2003  . EP IMPLANTABLE DEVICE N/A 07/14/2016   Procedure: ICD Generator Changeout;  Surgeon: Evans Lance, MD;  Location: [facility];  Service: Cardiovascular;  Laterality: N/A;  . ESOPHAGOGASTRODUODENOSCOPY  06/20/2012   Procedure: ESOPHAGOGASTRODUODENOSCOPY (EGD);  Surgeon: Sable Feil, MD;  Location: [facility];  Service: Endoscopy;  Laterality: N/A;  . EYE SURGERY    . LAPAROTOMY    . RETINOPATHY SURGERY Bilateral   . rotator cuff  surgery     left     Family History  Problem Relation Age of Onset  . Diabetes Brother   . Heart disease Father   . Heart disease Mother   . Diabetes Mother   . Diabetes Brother   . Diabetes Brother   . Cancer Brother        baldder  . Heart disease Brother   . Diabetes Sister   . Diabetes Sister   . Kidney disease Sister        dialysis  . Diabetes Sister   . Diabetes Sister   . Diabetes Sister   . Throat cancer Paternal Uncle      Social History   Socioeconomic History  . Marital status: Married    Spouse name: Not on file  . Number of children: 4  . Years of education: Not on file  . Highest education level: Not on file  Social Needs  . Financial resource strain: Not on file  . Food insecurity - worry: Not on file  . Food insecurity - inability: Not on file  . Transportation needs - medical: Not on file  . Transportation needs - non-medical: Not on file  Occupational History  . Occupation: Retired from [employer]: RETIRED  Tobacco Use  . Smoking status: Never Smoker  . Smokeless tobacco: Never Used  Substance and Sexual Activity  . Alcohol use: No    Alcohol/week: 0.0 oz  . Drug use: No  . Sexual activity: Yes  Other Topics Concern  . Not on file  Social History Narrative   Right handed, Married, 4 kids from previous marriage.  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 Caffeine 1 cup daily.     BP 122/64   Pulse 60   Ht 5\' 4"  (1.626 m)   Wt 244 lb 9.6 oz (110.9 kg)   SpO2 98%   BMI 41.99 kg/m   Physical Exam:  Well appearing 84 yo man, NAD HEENT: Unremarkable Neck:  No JVD, no thyromegally Lymphatics:  No adenopathy Back:  No CVA tenderness Lungs:  Clear with no wheezes HEART:  Regular rate rhythm, no murmurs, no rubs, no clicks Abd:  soft, positive bowel sounds, no organomegally, no rebound, no guarding Ext:  2 plus pulses, no edema, no cyanosis, no clubbing Skin:  No rashes no nodules Neuro:  CN II through XII intact, motor grossly  intact  EKG - nsr with LBBB  DEVICE  Normal device function.  See PaceArt for details.   Assess/Plan: 1. VT - he has had no recurrent ventricular arrhythmias. No change in  meds. 2. Chronic systolic heart failure - his symptoms are class 2. No change in his meds.  He appears to be euvolemic 3. ICD - his device is working normally. No change in programming. Will recheck in several months.  [name]

## 2017-09-30 LAB — CUP PACEART INCLINIC DEVICE CHECK
Battery Remaining Longevity: 97 mo
Battery Voltage: 3.01 V
Brady Statistic RA Percent Paced: 80.42 %
Date Time Interrogation Session: 20190130220952
HIGH POWER IMPEDANCE MEASURED VALUE: 51 Ohm
HIGH POWER IMPEDANCE MEASURED VALUE: 74 Ohm
Implantable Lead Model: 158
Implantable Lead Model: 4087
Lead Channel Impedance Value: 380 Ohm
Lead Channel Impedance Value: 380 Ohm
Lead Channel Impedance Value: 399 Ohm
Lead Channel Pacing Threshold Amplitude: 0.75 V
Lead Channel Sensing Intrinsic Amplitude: 14.625 mV
Lead Channel Setting Pacing Amplitude: 2.5 V
Lead Channel Setting Sensing Sensitivity: 0.3 mV
MDC IDC LEAD IMPLANT DT: 20030519
MDC IDC LEAD IMPLANT DT: 20030519
MDC IDC LEAD LOCATION: 753859
MDC IDC LEAD LOCATION: 753860
MDC IDC LEAD SERIAL: 115102
MDC IDC LEAD SERIAL: 159999
MDC IDC MSMT LEADCHNL LV IMPEDANCE VALUE: 4047 Ohm
MDC IDC MSMT LEADCHNL LV IMPEDANCE VALUE: 4047 Ohm
MDC IDC MSMT LEADCHNL LV IMPEDANCE VALUE: 4047 Ohm
MDC IDC MSMT LEADCHNL RA PACING THRESHOLD AMPLITUDE: 1.25 V
MDC IDC MSMT LEADCHNL RA PACING THRESHOLD PULSEWIDTH: 0.6 ms
MDC IDC MSMT LEADCHNL RA SENSING INTR AMPL: 2.25 mV
MDC IDC MSMT LEADCHNL RV PACING THRESHOLD PULSEWIDTH: 0.4 ms
MDC IDC PG IMPLANT DT: 20171114
MDC IDC SET LEADCHNL RV PACING AMPLITUDE: 2.5 V
MDC IDC SET LEADCHNL RV PACING PULSEWIDTH: 0.4 ms
MDC IDC STAT BRADY AP VP PERCENT: 0.4 %
MDC IDC STAT BRADY AP VS PERCENT: 80.33 %
MDC IDC STAT BRADY AS VP PERCENT: 0.01 %
MDC IDC STAT BRADY AS VS PERCENT: 19.26 %
MDC IDC STAT BRADY RV PERCENT PACED: 0.35 %

## 2017-10-11 ENCOUNTER — Encounter: Payer: Self-pay | Admitting: Pediatrics

## 2017-10-11 ENCOUNTER — Ambulatory Visit (INDEPENDENT_AMBULATORY_CARE_PROVIDER_SITE_OTHER): Payer: Medicare Other | Admitting: Pediatrics

## 2017-10-11 ENCOUNTER — Other Ambulatory Visit: Payer: Self-pay | Admitting: Pediatrics

## 2017-10-11 VITALS — BP 138/81 | HR 69 | Temp 97.8°F | Ht 64.0 in | Wt 242.2 lb

## 2017-10-11 DIAGNOSIS — E785 Hyperlipidemia, unspecified: Secondary | ICD-10-CM

## 2017-10-11 DIAGNOSIS — E1140 Type 2 diabetes mellitus with diabetic neuropathy, unspecified: Secondary | ICD-10-CM | POA: Diagnosis not present

## 2017-10-11 DIAGNOSIS — B353 Tinea pedis: Secondary | ICD-10-CM | POA: Diagnosis not present

## 2017-10-11 DIAGNOSIS — K219 Gastro-esophageal reflux disease without esophagitis: Secondary | ICD-10-CM | POA: Diagnosis not present

## 2017-10-11 DIAGNOSIS — IMO0002 Reserved for concepts with insufficient information to code with codable children: Secondary | ICD-10-CM

## 2017-10-11 DIAGNOSIS — Z8739 Personal history of other diseases of the musculoskeletal system and connective tissue: Secondary | ICD-10-CM | POA: Diagnosis not present

## 2017-10-11 DIAGNOSIS — E1165 Type 2 diabetes mellitus with hyperglycemia: Secondary | ICD-10-CM | POA: Diagnosis not present

## 2017-10-11 DIAGNOSIS — N189 Chronic kidney disease, unspecified: Secondary | ICD-10-CM

## 2017-10-11 LAB — BAYER DCA HB A1C WAIVED: HB A1C (BAYER DCA - WAIVED): 7.2 % — ABNORMAL HIGH (ref <7.0)

## 2017-10-11 MED ORDER — INSULIN NPH (HUMAN) (ISOPHANE) 100 UNIT/ML ~~LOC~~ SUSP: SUBCUTANEOUS | 3 refills | Status: DC

## 2017-10-11 MED ORDER — INSULIN REGULAR HUMAN 100 UNIT/ML IJ SOLN: INTRAMUSCULAR | 1 refills | Status: DC

## 2017-10-11 MED ORDER — ALLOPURINOL 300 MG PO TABS: ORAL_TABLET | ORAL | 1 refills | Status: DC

## 2017-10-11 MED ORDER — GABAPENTIN 300 MG PO CAPS: 300.0000 mg | ORAL_CAPSULE | Freq: Two times a day (BID) | ORAL | 2 refills | Status: DC

## 2017-10-11 MED ORDER — "INSULIN SYRINGE-NEEDLE U-100 30G X 3/16"" 1 ML MISC": 6.0000 | Freq: Every day | 5 refills | Status: DC

## 2017-10-11 MED ORDER — CLOTRIMAZOLE 1 % EX CREA: 1.0000 "application " | TOPICAL_CREAM | Freq: Two times a day (BID) | CUTANEOUS | 0 refills | Status: DC

## 2017-10-11 MED ORDER — ATORVASTATIN CALCIUM 80 MG PO TABS: 40.0000 mg | ORAL_TABLET | Freq: Every day | ORAL | 3 refills | Status: DC

## 2017-10-11 NOTE — Progress Notes (Signed)
Subjective:   Patient ID: Manuel Kline, male    DOB: [birth year], 84 y.o.   MRN: [id number] CC: Follow-up diabetes HPI: Manuel Kline is a 84 y.o. male presenting for Follow-up  Dm2: Checking blood sugars twice a day, low 100s, up to 150s, down to 80s. Asymptomatic with lows and highs. Says he has been eatin gmeals regularly. Takes insulin R and insulin N together in the morning before breakfast and again at night before dinner. Insulin N has gotten expensive, $200 a vial.   HTN: taking medicine regularly at home, no CP, no SOB  Wears diabetic shoes. Has had some peeling on his feet b/l, denies itching, does have decreased feeling.  Neuropathy: bothered by burning feeling in his toes b/l, comes and goes throughout the day. Takes gabapentin with some improvement.   CKD: follows with nephrology  Relevant past medical, surgical, family and social history reviewed. Allergies and medications reviewed and updated. Social History   Tobacco Use  Smoking Status Never Smoker  Smokeless Tobacco Never Used   ROS: Per HPI   Objective:    BP 138/81   Pulse 69   Temp 97.8 F (36.6 C) (Oral)   Ht 5\' 4"  (1.626 m)   Wt 242 lb 3.2 oz (109.9 kg)   BMI 41.57 kg/m   Wt Readings from Last 3 Encounters:  10/11/17 242 lb 3.2 oz (109.9 kg)  09/29/17 244 lb 9.6 oz (110.9 kg)  04/19/17 242 lb (109.8 kg)    Gen: NAD, alert, cooperative with exam, NCAT EYES: EOMI, no conjunctival injection, or no icterus ENT: OP without erythema LYMPH: no cervical LAD CV: distant heart sounds, NRRR, distal pulses 2+ b/l Resp: CTABL, no wheezes, normal WOB Abd: +BS, soft, NTND. no guarding or organomegaly Ext: No edema, warm Neuro: Alert and oriented, decreased sensation to feet b/l with monofilament MSK: normal muscle bulk  Assessment & Plan:  [name] was seen today for follow-up multiple med problems.  Diagnoses and all orders for this visit:  Type 2 diabetes, uncontrolled, with neuropathy (HCC) A1c 7.2. Taking  both insulins BID before meals, previously having lows at lunch time when he took at lunch as well. Having hard time affording medicines. Will refer to [facility], see if qualifies for med assistance.  -     Bayer DCA Hb A1c Waived -     gabapentin (NEURONTIN) 300 MG capsule; Take 1 capsule (300 mg total) by mouth 2 (two) times daily. -     insulin NPH Human (HUMULIN N) 100 UNIT/ML injection; INJECT 35 UNITS BID -     insulin regular (NOVOLIN R RELION) 100 units/mL injection; INJECT 30 UNITS SUBCUTANEOUSLY Two TIMES DAILY BEFORE MEALS -     AMB Referral to [facility] -     Insulin Syringe-Needle U-100 30G X 3/16" 1 ML MISC; 6 each by Does not apply route 6 (six) times daily. -     Basic Metabolic Panel  H/O: gout No recent flares, cont below -     allopurinol (ZYLOPRIM) 300 MG tablet; TAKE 1/2 (ONE-HALF) TABLET BY MOUTH ONCE DAILY  Gastroesophageal reflux disease, esophagitis presence not specified Not taking PPI anymore, not having any symptoms.   Tinea pedis of both feet Start treatment with below -     clotrimazole (CVS CLOTRIMAZOLE) 1 % cream; Apply 1 application topically 2 (two) times daily.  HLD Stable, cont below -     atorvastatin (LIPITOR) 80 MG tablet; Take 0.5 tablets (40 mg total) by mouth  daily.   Follow up plan: Return in about 3 months (around 01/08/2018). Assunta Found, MD [place]

## 2017-10-12 ENCOUNTER — Other Ambulatory Visit: Payer: Self-pay

## 2017-10-12 LAB — BASIC METABOLIC PANEL
BUN/Creatinine Ratio: 16 (ref 10–24)
BUN: 35 mg/dL — AB (ref 8–27)
CALCIUM: 9.3 mg/dL (ref 8.6–10.2)
CO2: 23 mmol/L (ref 20–29)
CREATININE: 2.25 mg/dL — AB (ref 0.76–1.27)
Chloride: 100 mmol/L (ref 96–106)
GFR calc Af Amer: 30 mL/min/{1.73_m2} — ABNORMAL LOW (ref >59)
GFR, EST NON AFRICAN AMERICAN: 26 mL/min/{1.73_m2} — AB (ref >59)
GLUCOSE: 82 mg/dL (ref 65–99)
Potassium: 4.2 mmol/L (ref 3.5–5.2)
SODIUM: 142 mmol/L (ref 134–144)

## 2017-10-12 NOTE — Patient Outreach (Signed)
[facility]) Care Management  10/12/2017  Manuel Kline 1932-06-25 [id number]   Telephone Screen  Referral Date: 10/11/17 Referral Source: MD office Referral Reason: "can't afford insulin, qualify for med assistance, DM" Insurance: [facility] Medicare   Outreach attempt # 1 to patient. Spoke with patient and screening completed.   Social: Patient resides in home with spouse. He voices that he is independent with ADLs/IADLs. He drives himself to appts. He voices that he fell in the ditch on last week by accident. He denies any injuries and states "it didn't hurt at all." He voices that he has wheelchair, walker and BSC that was donated by a neighbor in the home but does not use any of it. He has life alert device but reports only wearing it when "wife isn't at home." RN CM educated patient on importance fall and safety measures and encouraged him to wear device 24/7.    Conditions: Per chart review, patient has PMH of DM,HTN, CHF, obesity, ICD, OA, CABG (2002), sleep apnea, neuropathy, CKD, Gout, GERD, tinea pedis of both feet and HLD. Patient voices that he is checking blood sugars about twice a day. He has not done so yet today. He reports that his blood sugars normally range from the 100s-150s. Per records patient's last A1C was 7.2 He complains of neuropathy to feet and PCP started him on new cream to apply BID at appt yesterday which has been working. He states that he has BP machine in the home but does not monitor often. Patient also voiced that he has HF scale and is enrolled with [facility] HF program. He voices that his kidney disease is "one point away from needing dialysis." He is agreeable to further educational  support in managing his chronic conditions.    Medications: Patient voices that he is taking between 15-18 meds. He reports that he is unable to afford his insulin meds. He voices that he only draws $750/month.  He states that he fills med planner weekly for himself along  with some assistance from spouse.    Appointments:  PCP: Dr. Evette Doffing- 10/11/17 Cardiologist: Dr. Wynonia Lawman and Dr. Lovena Le Renal: Dr. Antionette Fairy Ophthalmologist: Dr. Zadie Rhine (appt on 10/18/17)   Advance Directives: Patient voices that he has [facility] POA (spouse). He has not provided medical team with copy and RN CM encouraged patient to do so.    Consent: [facility] services reviewed and discussed. Patient gave verbal consent for services.    Plan: RN CM will notify [facility] administrative assistant of case status. RN CM will send [facility] referral for further disease mgmt education and support. RN CM will send [place] referral for polypharmacy med review and possible med assistance.    Enzo Montgomery, RN,BSN,CCM [facility] Telephonic Care Management Coordinator Direct Phone: [phone] Toll Free: [phone] Fax: [phone]

## 2017-10-18 ENCOUNTER — Other Ambulatory Visit: Payer: Self-pay | Admitting: Pharmacy Technician

## 2017-10-18 ENCOUNTER — Other Ambulatory Visit: Payer: Self-pay | Admitting: Pharmacist

## 2017-10-18 DIAGNOSIS — H3562 Retinal hemorrhage, left eye: Secondary | ICD-10-CM | POA: Diagnosis not present

## 2017-10-18 DIAGNOSIS — E113393 Type 2 diabetes mellitus with moderate nonproliferative diabetic retinopathy without macular edema, bilateral: Secondary | ICD-10-CM | POA: Diagnosis not present

## 2017-10-18 DIAGNOSIS — H35042 Retinal micro-aneurysms, unspecified, left eye: Secondary | ICD-10-CM | POA: Diagnosis not present

## 2017-10-18 DIAGNOSIS — H3561 Retinal hemorrhage, right eye: Secondary | ICD-10-CM | POA: Diagnosis not present

## 2017-10-18 DIAGNOSIS — H35041 Retinal micro-aneurysms, unspecified, right eye: Secondary | ICD-10-CM | POA: Diagnosis not present

## 2017-10-18 NOTE — Patient Outreach (Signed)
[facility]) Care Management  10/18/2017  Manuel Kline 11/09/1931 [id number]   Received [name] Patient assistance referral from Pharmacist [facility]. Mailled patient portion of application for Humulin R and Humulin N to patient. Faxed provider portion of application to Dr. Arbie Cookey Vincent's office.  [name] Sanborn, [place] [facility] [phone]

## 2017-10-18 NOTE — Patient Outreach (Addendum)
[facility]) Care Management  [name]   10/18/2017  [name] June 28, 1932 [id number]  Subjective: Manuel Kline is a 84 y.o. male referred to pharmacy for medication review and medication assitance. Called and spoke with patient. HIPAA identifiers verified and verbal consent received. [name] also gives permission for me to speak with his wife.  Mr and [name] report that the patient has been having difficulty with affording his insulins. Reports that the patient has partial extra help. Check patient's extra help status via the Medicare.gov website and confirm that patient currently has 25% partial extra help. Call [employer] and confirm with representative, Ronny Bacon, that only patients with full extra help are excluded from applying for their patient assistance program, patients with partial extra help are still eligible.  Mr and [name] report that the patient uses a weekly pillbox to organize his medication. Reports that he fills his own pillbox once a week.   Patient reports that he has been taking his furosemide twice daily with breakfast and at bedtime. Patient reports having to get up in the middle of the night to urinate. Counsel patient to take his evening dose earlier, around 5 pm in order to avoid having to get up in the middle of the night/to decrease his fall risk.  Denies dizziness or sedation with his gabapentin. Reports that he has had one fall in the past year. Reports that this happened when he was helping one of his friends with picking up garbage. Denies any dizziness or sedation with the fall. Reports that he does occasionally have dizziness first thing in the morning if he gets out of bed too quickly. [name] reports that patient has been counseled by his provider about getting up slowly. Reinforce this counseling, advising patient to sit up out of bed, then slowly count to five before standing and then after standing, count to five before  walking to reduce his dizziness. Advise patient to also use this strategy when rising from a chair while holding onto the sides.  Reports that patient has a blood pressure monitor, but does not currently use it. Counsel to check blood sugar regularly, keep a log and bring this log to medical appointments.  Reports that his blood sugar this morning was 175mg /dL. Reports that he had just started skipping one of his evening doses of his insulin in order to save money. Counsel patient about the importance of taking his insulin as directed by his physician. Patient verbalizes understanding. Denies any recent low blood sugars. Reports that when he has had lows in the past, he "gets weak and doesn't feel good". Counsel regarding how to manage low blood sugars.  Patient reports that he is taking gabapentin only once daily, rather than twice, as directed by his kidney doctor  [name] reports that the patient's Nitrostat is currently expired. Counsel on how to use and store Nitrostat. Call patient's [place] and speak with Operating Room Services. Ask [name] to send a fax to patient's Cardiologist requesting a refill of the patient's Nitrostat. Also ask [name] for patient's total out of pocket expenditure since the beginning of 2019. [name] reports that the patient has already exceeded the [employer] out of pocket spend requirement for the calendar year..  Call [name] back. Let them know that I will ask Pharmacy Technician Etter Sjogren to send the application for the [employer] patient assistance program to the patient and to his PCP. Request that [name]  pick up a copy of his out of pocket spend report from the pharmacy when he is next at [facility].  Mr and Mrs. Chesmore deny any further medication questions/concerns at this time. Provide my phone number.   Objective:   Encounter Medications: Outpatient Encounter Medications as of 10/18/2017  Medication Sig  . allopurinol (ZYLOPRIM) 300 MG tablet TAKE  1/2 (ONE-HALF) TABLET BY MOUTH ONCE DAILY  . aspirin 81 MG tablet Take 81 mg by mouth daily.   Marland Kitchen atorvastatin (LIPITOR) 80 MG tablet Take 0.5 tablets (40 mg total) by mouth daily.  . cloNIDine (CATAPRES) 0.1 MG tablet Take 1 tablet by mouth 2 (two) times daily.  . clotrimazole (CVS CLOTRIMAZOLE) 1 % cream Apply 1 application topically 2 (two) times daily.  . Cyanocobalamin (VITAMIN B 12 PO) Take 1,000 mcg by mouth daily.  . furosemide (LASIX) 40 MG tablet Take 1 tablet by mouth 2 (two) times daily.  Marland Kitchen gabapentin (NEURONTIN) 300 MG capsule Take 1 capsule (300 mg total) by mouth 2 (two) times daily. (Patient taking differently: Take 300 mg by mouth daily. )  . insulin NPH Human (HUMULIN N) 100 UNIT/ML injection INJECT 35 UNITS BID  . insulin regular (NOVOLIN R RELION) 100 units/mL injection INJECT 30 UNITS SUBCUTANEOUSLY Two TIMES DAILY BEFORE MEALS  . metoprolol succinate (TOPROL-XL) 50 MG 24 hr tablet Take 50 mg by mouth daily.  . Multiple Vitamin (MULTIVITAMIN) tablet Take 1 tablet by mouth daily.   . Omega-3 Fatty Acids (FISH OIL) 1000 MG CPDR Take 1 tablet by mouth daily.  . quinapril (ACCUPRIL) 40 MG tablet Take 40 mg by mouth at bedtime.   Marland Kitchen glucose blood test strip Use to check blood glucose 4 times daily  . Insulin Syringe-Needle U-100 30G X 3/16" 1 ML MISC 6 each by Does not apply route 6 (six) times daily.  . nitroGLYCERIN (NITROSTAT) 0.4 MG SL tablet Place 0.4 mg under the tongue every 5 (five) minutes as needed for chest pain.     Assessment:   Drugs sorted by system:  Cardiovascular: aspirin, atorvastatin, clonidine, furosemide, metoprolol ER, Nitrostat, fish oil, quinapril  Endocrine: Humulin N, Novolin R  Topical: clotrimazole cream  Pain: gabapentin  Vitamins/Minerals: Vitamin B12, multivitamin  Miscellaneous: allopurinol   Duplications in therapy: none noted Gaps in therapy: none noted Medications to avoid in the elderly: gabapentin in CrCl < 60 mL/min;  however, note that gabapentin has been appropriately dose adjusted by patient's nephrologist to 300 mg once daily. Drug interactions:  . Allopurinol + quinapril: Angiotensin-Converting Enzyme Inhibitors may enhance the potential for allergic or hypersensitivity reactions to Allopurinol. Interaction is of concern within 5 weeks of initiation of therapy. Patient has been on each for >3 months. . Clonidine + metoprolol: Alpha2-Agonists may enhance the AV-blocking effect of Beta-Blockers. Sinus node dysfunction may also be enhanced. Beta-Blockers may enhance the rebound hypertensive effect of Alpha2-Agonists. This effect can occur when the Alpha2-Agonist is abruptly withdrawn. Patient with dual implantable cardioverter-defibrillator followed by Cardiologist. Encouraged patient to monitor his blood pressure and pulse and keep this record for Cardiologist.   Other issues noted:  Marland Kitchen Patient in need of new Nitrostat prescription. Pharmacy contacted. . Patient in need of assistance with cost of insulins   Plan: 1. Patient to take his evening dose of furosemide earlier, around 5 pm, in order to avoid having to get up in the middle of the night/to decrease his fall risk. 2. Patient to pick up new prescription for Nitrostat. 3. Patient to pick  up a copy of his out of pocket spend report from the pharmacy when he is next at [facility]. 4. Will ask Pharmacy Technician Etter Sjogren to mail/fax the application for the [employer] patient assistance program to the patient and to his PCP. 5. I will follow with Caryl Pina as she assists Mr. Samek with completing and submitting this patient assistance application.  Harlow Asa, PharmD, [facility] [phone]

## 2017-10-20 ENCOUNTER — Other Ambulatory Visit: Payer: Self-pay

## 2017-10-20 ENCOUNTER — Telehealth: Payer: Self-pay

## 2017-10-20 NOTE — Telephone Encounter (Signed)
Spoke with Manuel Kline regarding him feeling a vibration in his chest. Pt stated that he had read on our paperwork that some alerts vibrate. Informed Manuel Kline that he had a metronic ICD and that if something was wrong his device would alarm. Pt stated that " the bell hasn't went off since yall fixed it last" Asked pt when this "vibration" started pt said a couple of days ago, pt stated that he was not having any pain.  Pt stated that he sent in a transmission to Dr. Wynonia Lawman informed pt that I did not access to those records. Offered pt an in-clinic apt to check device, pt stated that he would just rather wait it out a couple days to see if it got worse and if it did he would call back. Pt appreciative of call

## 2017-10-20 NOTE — Patient Outreach (Signed)
[place] [facility]) Care Management  [place]  10/20/2017   Manuel Kline 1931/09/15 [id number]  Subjective: Telephone call to the patient for an initial assessment. HIPAA verified.  The patient lives in the home with his wife.  He states that he is independent with ADLS and needs some assistants with his IADLS.  He is able to drive himself to appointments.  He states that he has a wheelchair, walker, cane, CBG meter, blood pressure cuff, and scale in the home. He states that he wears glasses to read and has a hearing aid.  He usually asks his wife to talk to for him on the phone because he is hard of hearing. He states that his CHF is monitored by his insurance company and also has a alert necklace but has it hanging on a nail.  RN Health Coach advised the patient why he needs to wear his medical alert necklace and the patient verbalized understanding.  Per chart review, patient has PMH of DM,HTN, CHF, Obesity, ICD, OA,, Sleep apnea, Neuropathy, CKD, Gout and GERD. Per patient's chart patient's last A1C was 7.2.  The patient states that he is adherent with his medications.. The patient states that he has an advanced directive. The patient states that he saw Dr Evette Doffing his PCP on 10-11-2017. He states that he had  an appointment with his ophthalmologist Dr Zadie Rhine on 10-18-2017.  Encounter Medications:  Outpatient Encounter Medications as of 10/20/2017  Medication Sig  . allopurinol (ZYLOPRIM) 300 MG tablet TAKE 1/2 (ONE-HALF) TABLET BY MOUTH ONCE DAILY  . aspirin 81 MG tablet Take 81 mg by mouth daily.   Marland Kitchen atorvastatin (LIPITOR) 80 MG tablet Take 0.5 tablets (40 mg total) by mouth daily.  . cloNIDine (CATAPRES) 0.1 MG tablet Take 1 tablet by mouth 2 (two) times daily.  . clotrimazole (CVS CLOTRIMAZOLE) 1 % cream Apply 1 application topically 2 (two) times daily.  . Cyanocobalamin (VITAMIN B 12 PO) Take 1,000 mcg by mouth daily.  . furosemide (LASIX) 40 MG tablet Take 1 tablet by  mouth 2 (two) times daily.  Marland Kitchen gabapentin (NEURONTIN) 300 MG capsule Take 300 mg by mouth daily.  Marland Kitchen glucose blood test strip Use to check blood glucose 4 times daily  . insulin NPH Human (HUMULIN N) 100 UNIT/ML injection INJECT 35 UNITS BID  . insulin regular (NOVOLIN R RELION) 100 units/mL injection INJECT 30 UNITS SUBCUTANEOUSLY Two TIMES DAILY BEFORE MEALS  . Insulin Syringe-Needle U-100 30G X 3/16" 1 ML MISC 6 each by Does not apply route 6 (six) times daily.  . metoprolol succinate (TOPROL-XL) 50 MG 24 hr tablet Take 50 mg by mouth daily.  . Multiple Vitamin (MULTIVITAMIN) tablet Take 1 tablet by mouth daily.   . nitroGLYCERIN (NITROSTAT) 0.4 MG SL tablet Place 0.4 mg under the tongue every 5 (five) minutes as needed for chest pain.  . Omega-3 Fatty Acids (FISH OIL) 1000 MG CPDR Take 1 tablet by mouth daily.  . quinapril (ACCUPRIL) 40 MG tablet Take 40 mg by mouth at bedtime.    No facility-administered encounter medications on file as of 10/20/2017.     Functional Status:  In your present state of health, do you have any difficulty performing the following activities: 03/18/2017  Hearing? N  Vision? N  Difficulty concentrating or making decisions? N  Walking or climbing stairs? Y  Comment recent fall  Dressing or bathing? N  Doing errands, shopping? N  Comment using electric scooter at grocery store  Preparing Food and eating ? N  Using the Toilet? N  In the past six months, have you accidently leaked urine? N  Do you have problems with loss of bowel control? N  Managing your Medications? N  Managing your Finances? N  Housekeeping or managing your Housekeeping? N  Some recent data might be hidden    Fall/Depression Screening: Fall Risk  10/20/2017 10/12/2017 10/11/2017  Falls in the past year? Yes Yes No  Number falls in past yr: 1 1 -  Injury with Fall? No No -  Comment - - -  Risk Factor Category  - - -  Risk for fall due to : - History of fall(s);Impaired  vision;Medication side effect -  Risk for fall due to: Comment - - -  Follow up Falls evaluation completed;Education provided;Falls prevention discussed Falls evaluation completed;Education provided;Falls prevention discussed -  Comment - - -   PHQ 2/9 Scores 10/20/2017 10/12/2017 10/11/2017 04/19/2017 03/18/2017 12/23/2016 11/20/2016  PHQ - 2 Score 0 0 0 0 0 0 0    Assessment: Patient will benefit from health coach outreach for disease management and support.   THN CM Care Plan Problem One     Most Recent Value  Care Plan Problem One  Knowledge deficit  Role Documenting the Problem One  [place] for Problem One  Active  THN Long Term Goal   in 90 days the patient will lower his a1c by 1-2 points  [facility] Long Term Goal Start Date  10/20/17  Interventions for Problem One [place] wil send the patient educational material  [facility] CM Short Term Goal #1   In 30 days the patient will verbalize that he cheks his blodd sugars on a regular basis  THN CM Short Term Goal #1 Start Date  10/20/17  Interventions for Short Term Goal #1  [facility] talked with the patient about checking his blood sugars and the benefits  THN CM Short Term Goal #2   In 30 days the patient will verbalize that he will watch his food intake  THN CM Short Term Goal #2 Start Date  10/20/17  Interventions for Short Term Goal #2  [facility] talked with the patient about how wtching his food intake can help lower his A1c       Plan: RN Health Coach will provide ongoing education for patient on diabetes through phone calls and sending printed information to patient for further discussion.  RN Health Coach will send welcome packet with consent to patient as well as printed information on diabetes.  RN Health Coach will send initial barriers letter, assessment, and care plan to primary care physician. RN Health Coach will contact patient in the month of March and patient agrees to next outreach.   [name] RN,  BSN, [place] Direct Dial:  [phone] Fax: [phone]

## 2017-10-28 ENCOUNTER — Other Ambulatory Visit: Payer: Self-pay | Admitting: Pharmacy Technician

## 2017-10-28 NOTE — Patient Outreach (Signed)
[place] [facility]) Care Management  10/28/2017  Manuel Kline 10/09/1931 [id number]  Incoming call from patient and his wife, HIPAA identifiers verified. Patient and wife wanted to ensure they send correct paperwork in. Explained to patient that we need an out of pocket expense printout as well as copies of both of their social security income letters. Both acknowledged understanding.  [name] [place], [place] Management [phone]

## 2017-11-02 ENCOUNTER — Other Ambulatory Visit: Payer: Self-pay | Admitting: Pharmacy Technician

## 2017-11-02 NOTE — Patient Outreach (Signed)
[name] [facility]) Care Management  11/02/2017  ZOLTON DOWSON 07-06-1932 [id number]   Received patient portion of [name] Application. Patient has yet to spend the required Out Of Pocket max of $1,100. Will inform Pharmacist Clayborne Artist [place], [place] Management [phone]

## 2017-11-04 ENCOUNTER — Encounter: Payer: Self-pay | Admitting: Neurology

## 2017-11-05 ENCOUNTER — Other Ambulatory Visit: Payer: Self-pay | Admitting: Pharmacy Technician

## 2017-11-05 ENCOUNTER — Other Ambulatory Visit: Payer: Self-pay | Admitting: Pharmacist

## 2017-11-05 NOTE — Patient Outreach (Signed)
[place] [facility]) Care Management  11/05/2017  Manuel Kline Oct 18, 1931 [id number]   Successful outreach call to patients wife, Manuel Kline, patients HIPAA identifiers verified. Informed Mrs. Betten that per [employer] requirements, patient has not met the required Out Of Pocket spending amount. However, we can attempt to send a waiver letter into the company to see if they are willing to waive the requirement.   Will mail out letter for patient to sign on Monday 03/11  [name]. [place], [place] Management [phone]

## 2017-11-05 NOTE — Patient Outreach (Signed)
Routed letter to pharmacy technician Etter Sjogren to send to patient with waiver letter for the patient's application for the [name] patient assistance program for Humulin R and Humulin N.  Harlow Asa, PharmD, [place] Management [phone]

## 2017-11-08 ENCOUNTER — Ambulatory Visit: Payer: Medicare Other | Admitting: Neurology

## 2017-11-08 ENCOUNTER — Encounter: Payer: Self-pay | Admitting: Neurology

## 2017-11-08 VITALS — BP 129/70 | HR 79 | Ht 64.0 in | Wt 240.0 lb

## 2017-11-08 DIAGNOSIS — Z9989 Dependence on other enabling machines and devices: Secondary | ICD-10-CM

## 2017-11-08 DIAGNOSIS — Z95810 Presence of automatic (implantable) cardiac defibrillator: Secondary | ICD-10-CM | POA: Diagnosis not present

## 2017-11-08 DIAGNOSIS — G4733 Obstructive sleep apnea (adult) (pediatric): Secondary | ICD-10-CM | POA: Diagnosis not present

## 2017-11-08 DIAGNOSIS — N184 Chronic kidney disease, stage 4 (severe): Secondary | ICD-10-CM | POA: Diagnosis not present

## 2017-11-08 DIAGNOSIS — I5023 Acute on chronic systolic (congestive) heart failure: Secondary | ICD-10-CM | POA: Insufficient documentation

## 2017-11-08 HISTORY — DX: Chronic kidney disease, stage 4 (severe): N18.4

## 2017-11-08 HISTORY — DX: Presence of automatic (implantable) cardiac defibrillator: Z95.810

## 2017-11-08 NOTE — Progress Notes (Signed)
[facility]  PATIENT: Manuel Kline Kline DOB: 1931/09/16   REASON FOR VISIT: follow-up obstructive sleep apnea on CPAP HISTORY FROM:patient    HISTORY OF PRESENT ILLNESS: HISTORYCD3-7-16; interval history : [name] Kline is seen here today for his interval history. He had undergone a repeat sleep study which confirmed obstructive sleep apnea still to be present. The patient has meanwhile learned that he has chronic kidney disease grade 3 and his medications have been changed to eliminate those that burden the kidney. His sleep study took place on 08-09-14. The patient had an AHI of 54.8 oxygen nadir was 81% with 67.6 minutes of desaturation. The patient was split to a Pap pressure of 10 cm water under which his AHI became 0.0. His machine has been reset to this new pressure. I would like to add that his apnea was positional dependent and that he was placed on an Eason nasal mask by [employer] in medium size, which he likes better. Manuel Kline Kline has again 100% compliance for the last 30 days of CPAP use dated 11-01-14 he used to machine 29 out of 30 days over 4 hours at night making it at 97% compliance for time of use. On average he uses the machine at night 7 hours and 27 minutes. The set pressure is 10 cm water with 3 cm EPR his residual AHI is 0.5. Today's Epworth sleepiness score was endorsed at 8 points and his fatigue severity score is 12 points.  From a sleep standpoint, the patient is doing excellent.  The patient is followed by advanced home care DME , No; [phone].  3/7/17CM . Manuel Kline Kline, 84 year old returns for follow-up. He has obstructive sleep apnea on CPAP. His compliance report today is greater than 4 hours at 87%. Patient claims he was sick with a respiratory infection and could not wear his CPAP for those other days. Average usage 6 hours 5 minutes. Set pressure is 10 cm of water EPR level is 3. AHI 0.3. Epworth score today is 7. Fatigue severity score is 11.  He returns for reevaluation.  UPDATE 11/04/16 CM Manuel Kline Kline is a 84 year old male returns today for follow-up for obstructive sleep apnea on CPAP. He brings in his chip so that a compliance report can be obtained. He is 100  percent compliance greater than 4 hours for 30 days. Average usage 6 hours 37 minutes. Set pressure is 10 cm of water. EPR level is 3 AHI 0.4 Epworth score today 8 fatigue severity score11. He returns for reevaluation.  I have the pleasure of meeting with Manuel Kline Kline today, at 84 year old male patient that has seen Gilford Raid in the last 3 visits.  Manuel Kline Kline is a CPAP user who alternates between 2 machines.  I have therefore 2 downloads to compare but in combination these show 100% compliance with an average use at time of 6 hours and 52 minutes at night, CPAP are set at 10 cmH2O pressure with 3 cm EPR and both have a residual AHI of 0.4.  I was able to look at a 90-day download which documented that the patient had not use the same machine from 8 February through the 20th.  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   Family history , now grandfather of 68, lives with wife in private home. His creatinine has risen, clearance is between 20 and 28 - he was asked to drink water, but he drinks lemonade, caffeine and  sugar free.     REVIEW OF SYSTEMS: CD  Full 14 system review of systems performed and notable only for those listed, all others are neg:  Sleep : obstructive sleep apnea on CPAP since 12/ 2015 .  Manuel Kline Kline endorsed the Epworth sleepiness score at 8 and the fatigue severity at 15 points, he did endorse one point out of 15  for depression. Itching - Family doctor stated this was kidney related.   CKD was grade 3 3 years ago,   Ref Range & Units 4wk ago  Glucose 65 - 99 mg/dL 82   Comment: Specimen received in contact with cells. No visible hemolysis  present. However GLUC may be decreased and K increased. Clinical  correlation indicated.   BUN 8 -  27 mg/dL 35 Abnormally high    Creatinine, Ser 0.76 - 1.27 mg/dL 2.25 Abnormally high    GFR calc non Af Amer >59 mL/min/1.73 26 Abnormally low    GFR calc Af Amer >59 mL/min/1.73 30 Abnormally low    BUN/Creatinine Ratio 10 - 24 16   Sodium 134 - 144 mmol/L 142   Potassium 3.5 - 5.2 mmol/L 4.2   Comment: Specimen received in contact with cells. No visible hemolysis  present. However GLUC may be decreased and K increased. Clinical  correlation indicated.   Chloride 96 - 106 mmol/L 100   CO2 20 - 29 mmol/L 23   Calcium 8.6 - 10.2 mg/dL 9.3   Resulting Agency  [facility]    Narrativ     Specimen Collected: 10/11/17 17:11 Last Resulted: 10/12/17 03:36     Lab Flowsheet    Order Details    View Encounter    Lab and Collection Details    Routing    Result History          Other Results from 10/11/2017   Contains abnormal data Bayer DCA Hb A1c Waived  Order: [id number]  Next appt:  01/10/2018 at 02:00 PM in Family Medicine Manuel Kline Maize, MD)  Dx:  Type 2 diabetes, uncontrolled   Ref Range & Units 4wk ago  Bayer DCA Hb A1c Waived <7.0 % 7.2 Abnormally high    Comment:                   Diabetic Adult      <7.0                     Healthy Adult    4.3 - 5.7                               (DCCT/NGSP)  American Diabetes Association's Summary of Glycemic Recommendations  for Adults with Diabetes: Hemoglobin A1c <7.0%. More stringent  glycemic goals (A1c <6.0%) may further reduce complications at the  cost of increased risk of hypoglycemia.          ALLERGIES: No Known Allergies  HOME MEDICATIONS: Outpatient Medications Prior to Visit  Medication Sig Dispense Refill  . allopurinol (ZYLOPRIM) 300 MG tablet TAKE 1/2 (ONE-HALF) TABLET BY MOUTH ONCE DAILY 45 tablet 1  . aspirin 81 MG tablet Take 81 mg by mouth daily.     Marland Kitchen atorvastatin (LIPITOR) 80 MG tablet Take 0.5 tablets (40 mg total) by  mouth daily. 90 tablet 3  . cloNIDine (CATAPRES) 0.1 MG tablet Take 1 tablet by mouth 2 (two) times daily.    . clotrimazole (CVS CLOTRIMAZOLE) 1 %  cream Apply 1 application topically 2 (two) times daily. 30 g 0  . Cyanocobalamin (VITAMIN B 12 PO) Take 1,000 mcg by mouth daily.    . furosemide (LASIX) 40 MG tablet Take 1 tablet by mouth 2 (two) times daily.    Marland Kitchen gabapentin (NEURONTIN) 300 MG capsule Take 300 mg by mouth daily.    Marland Kitchen glucose blood test strip Use to check blood glucose 4 times daily 400 each 0  . insulin NPH Human (HUMULIN N) 100 UNIT/ML injection INJECT 35 UNITS BID 30 mL 3  . insulin regular (NOVOLIN R RELION) 100 units/mL injection INJECT 30 UNITS SUBCUTANEOUSLY Two TIMES DAILY BEFORE MEALS 30 mL 1  . Insulin Syringe-Needle U-100 30G X 3/16" 1 ML MISC 6 each by Does not apply route 6 (six) times daily. 200 each 5  . metoprolol succinate (TOPROL-XL) 50 MG 24 hr tablet Take 50 mg by mouth daily.    . Multiple Vitamin (MULTIVITAMIN) tablet Take 1 tablet by mouth daily.     . nitroGLYCERIN (NITROSTAT) 0.4 MG SL tablet Place 0.4 mg under the tongue every 5 (five) minutes as needed for chest pain.    . Omega-3 Fatty Acids (FISH OIL) 1000 MG CPDR Take 1 tablet by mouth daily.    . quinapril (ACCUPRIL) 40 MG tablet Take 40 mg by mouth at bedtime.      No facility-administered medications prior to visit.     PAST MEDICAL HISTORY: Past Medical History:  Diagnosis Date  . Adenomatous colon polyp 2006  . CAD (coronary artery disease)   . Calcium oxalate renal stones   . Cardiomyopathy   . Cataract   . Diabetes ([place])   . Erectile dysfunction   . Erosive esophagitis   . Hemorrhoids   . HLD (hyperlipidemia)   . HTN (hypertension)   . Hypertensive heart disease without CHF 07/31/2011  . ICD (implantable cardiac defibrillator) in place   . ICD dual chamber in situ   . Metabolic syndrome   . Morbid obesity ([place])   . Osteoarthritis   . S/P CABG (coronary artery bypass graft)  11/02/2000  . Sleep apnea    cardiologists : Dr .Lovena Le ( defibrillator / pacemaker )  and Dr. Wynonia Lawman   PAST SURGICAL HISTORY: Past Surgical History:  Procedure Laterality Date  . ABDOMINAL EXPLORATION SURGERY    . BACK SURGERY     X'3  . cardiac bypass    . CARDIAC DEFIBRILLATOR PLACEMENT    . CARPAL TUNNEL RELEASE     X2, bilateral  . CATARACT EXTRACTION    . COLONOSCOPY  06/20/2012   Procedure: COLONOSCOPY;  Surgeon: Sable Feil, MD;  Location: [facility];  Service: Endoscopy;  Laterality: N/A;  . DOPPLER ECHOCARDIOGRAPHY  2003  . EP IMPLANTABLE DEVICE N/A 07/14/2016   Procedure: ICD Generator Changeout;  Surgeon: Evans Lance, MD;  Location: [facility];  Service: Cardiovascular;  Laterality: N/A;  . ESOPHAGOGASTRODUODENOSCOPY  06/20/2012   Procedure: ESOPHAGOGASTRODUODENOSCOPY (EGD);  Surgeon: Sable Feil, MD;  Location: [facility];  Service: Endoscopy;  Laterality: N/A;  . EYE SURGERY    . LAPAROTOMY    . RETINOPATHY SURGERY Bilateral   . rotator cuff surgery     left    FAMILY HISTORY: Family History  Problem Relation Age of Onset  . Diabetes Brother   . Heart disease Father   . Heart disease Mother   . Diabetes Mother   . Diabetes Brother   . Diabetes Brother   .  Cancer Brother        baldder  . Heart disease Brother   . Diabetes Sister   . Diabetes Sister   . Kidney disease Sister        dialysis  . Diabetes Sister   . Diabetes Sister   . Diabetes Sister   . Throat cancer Paternal Uncle     SOCIAL HISTORY: Social History   Socioeconomic History  . Marital status: Married    Spouse name: Not on file  . Number of children: 4  . Years of education: Not on file  . Highest education level: Not on file  Social Needs  . Financial resource strain: Not on file  . Food insecurity - worry: Not on file  . Food insecurity - inability: Not on file  . Transportation needs - medical: Not on file  . Transportation needs - non-medical:  Not on file  Occupational History  . Occupation: Retired from [employer]: RETIRED  Tobacco Use  . Smoking status: Never Smoker  . Smokeless tobacco: Never Used  Substance and Sexual Activity  . Alcohol use: No    Alcohol/week: 0.0 oz  . Drug use: No  . Sexual activity: Yes  Other Topics Concern  . Not on file  Social History Narrative   Right handed, Married, 4 kids from previous marriage.  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 Caffeine 1 cup daily.   No ETOH, NO tobacco, no caffeine anymore.   PHYSICAL EXAM  Vitals:   11/08/17 1305  BP: 129/70  Pulse: 79  Weight: 240 lb (108.9 kg)  Height: 5\' 4"  (1.626 m)   Body mass index is 41.2 kg/m.   mallampoti 5, pale tongue, neck circumference is 18.5 inches. No click, no TMJ pain and no retrognathia.   General: The patient is awake, alert and appears not in acute distress. The patient is poorly groomed. He is still obese, hard of hearing. He answers many questions  In a tangential way -   Head: Normocephalic, atraumatic.  Neck is supple. Mallampati 3-4. neck circumference: 19 inches .   Cardiovascular: Irregular rate and rhythm , without murmurs- defibrillator patient.  Respiratory: Lungs are clear to auscultation. His voice is very hoarse.  Skin: Without evidence of edema,but itchy rash on both forearms.  Tongue is pale.   Neurologic exam : The patient is awake and alert, oriented to place and time. Memory subjective described as intact. There is a normal attention span & concentration ability. Speech is fluent without dysarthria, dysphonia or aphasia. Mood and affect are appropriate.ESS 8. FSS 11 Cranial nerves:Pupils are barely reactive to light- same size, but not constriction. Visual fields by finger perimetry are intact. Hearing loss.  Facial sensation intact to fine touch. Facial motor strength is symmetric, is tongue and uvula move midline. Motor exam: Normal tone, muscle bulk and symmetric strength in all  extremities. Coordination: Rapid alternating movements in the fingers/hands is slowed . There is mild ataxia, dysmetria or tremor.  Gait and station: Arises from the chair unassisted, but needs to brace himself - he ambulated short distance in the hall, no drift noted. His  stance is wide based normal. Deep tendon reflexes: Depressed in the upper and lower extremities . Absent patella ,  Babinski maneuver response is equivocal.  DIAGNOSTIC DATA (LABS, IMAGING, TESTING) - I reviewed patient records, labs, notes, testing and imaging myself where available.  Lab Results  Component Value Date   WBC 8.5 12/23/2016   HGB 13.6  12/23/2016   HCT 39.0 12/23/2016   MCV 86 12/23/2016   PLT 188 12/23/2016      Component Value Date/Time   NA 142 10/11/2017 1711   K 4.2 10/11/2017 1711   CL 100 10/11/2017 1711   CO2 23 10/11/2017 1711   GLUCOSE 82 10/11/2017 1711   GLUCOSE 129 (H) 07/03/2016 1028   BUN 35 (H) 10/11/2017 1711   CREATININE 2.25 (H) 10/11/2017 1711   CREATININE 2.04 (H) 07/03/2016 1028   CALCIUM 9.3 10/11/2017 1711   PROT 7.0 08/29/2015 0841   ALBUMIN 4.5 08/29/2015 0841   AST 17 08/29/2015 0841   ALT 21 08/29/2015 0841   ALKPHOS 128 (H) 08/29/2015 0841   BILITOT 0.5 08/29/2015 0841   GFRNONAA 26 (L) 10/11/2017 1711   GFRAA 30 (L) 10/11/2017 1711    ASSESSMENT AND PLAN  84 y.o. year old male  has a past medical history of  Sleep apnea; Morbid obesity ([place]); CAD, CKD 3 and defibrillator patient .  Continue CPAP at current settings, which are sufficient ( 10 cm with 3 cm water EPR )  , and his compliance is excellent, over 90 days close to 100%: AHI down to 0.4/hr.  I spent 25 minutes in total face to face time with the patient more than 50% of which was spent counseling and coordination of care, reviewing test results reviewing medications and discussing and reviewing the diagnosis of obstructive sleep apnea and continued compliance with CPAP.  I like for him to drink water  - hydrate well, reduce BUN and improve renal function. No artificial sweetener, no sugars and no caffeine after lunch time. He gardens and [employer] his lawn, over 3 acres.   Larey Seat, MD Diplomate of the ABPN, ABSM and member of the AASM.   [facility] [address] [place], [place] [address] [phone]

## 2017-11-08 NOTE — Patient Instructions (Signed)
Sleep Apnea Sleep apnea is a condition that affects breathing. People with sleep apnea have moments during sleep when their breathing pauses briefly or gets shallow. Sleep apnea can cause these symptoms:  Trouble staying asleep.  Sleepiness or tiredness during the day.  Irritability.  Loud snoring.  Morning headaches.  Trouble concentrating.  Forgetting things.  Less interest in sex.  Being sleepy for no reason.  Mood swings.  Personality changes.  Depression.  Waking up a lot during the night to pee (urinate).  Dry mouth.  Sore throat.  Follow these instructions at home:  Make any changes in your routine that your doctor recommends.  Eat a healthy, well-balanced diet.  Take over-the-counter and prescription medicines only as told by your doctor.  Avoid using alcohol, calming medicines (sedatives), and narcotic medicines.  Take steps to lose weight if you are overweight.  If you were given a machine (device) to use while you sleep, use it only as told by your doctor.  Do not use any tobacco products, such as cigarettes, chewing tobacco, and e-cigarettes. If you need help quitting, ask your doctor.  Keep all follow-up visits as told by your doctor. This is important. Contact a doctor if:  The machine that you were given to use during sleep is uncomfortable or does not seem to be working.  Your symptoms do not get better.  Your symptoms get worse. Get help right away if:  Your chest hurts.  You have trouble breathing in enough air (shortness of breath).  You have an uncomfortable feeling in your back, arms, or stomach.  You have trouble talking.  One side of your body feels weak.  A part of your face is hanging down (drooping). These symptoms may be an emergency. Do not wait to see if the symptoms will go away. Get medical help right away. Call your local emergency services (911 in the [place] Do not drive yourself to the hospital. This information  is not intended to replace advice given to you by your health care provider. Make sure you discuss any questions you have with your health care provider. Document Released: 05/26/2008 Document Revised: 04/12/2016 Document Reviewed: 05/27/2015 Elsevier Interactive Patient Education  2018 [place]. Chronic Kidney Disease, Adult Chronic kidney disease (CKD) happens when the kidneys are damaged during a time of 3 or more months. The kidneys are two organs that do many important jobs in the body. These jobs include:  Removing wastes and extra fluids from the blood.  Making hormones that maintain the amount of fluid in your tissues and blood vessels.  Making sure that the body has the right amount of fluids and chemicals.  Most of the time, this condition does not go away, but it can usually be controlled. Steps must be taken to slow down the kidney damage or stop it from getting worse. Otherwise, the kidneys may stop working. Follow these instructions at home:  Follow your diet as told by your doctor. You may need to avoid alcohol, salty foods (sodium), and foods that are high in potassium, calcium, and protein.  Take over-the-counter and prescription medicines only as told by your doctor. Do not take any new medicines unless your doctor says you can do that. These include vitamins and minerals. ? Medicines and nutritional supplements can make kidney damage worse. ? Your doctor may need to change how much medicine you take.  Do not use any tobacco products. These include cigarettes, chewing tobacco, and e-cigarettes. If you need help quitting,  ask your doctor.  Keep all follow-up visits as told by your doctor. This is important.  Check your blood pressure. Tell your doctor if there are changes to your blood pressure.  Get to a healthy weight. Stay at that weight. If you need help with this, ask your doctor.  Start or continue an exercise plan. Try to exercise at least 30 minutes a day, 5  days a week.  Stay up-to-date with your shots (immunizations) as told by your doctor. Contact a doctor if:  Your symptoms get worse.  You have new symptoms. Get help right away if:  You have symptoms of end-stage kidney disease. These include: ? Headaches. ? Skin that is darker or lighter than normal. ? Numbness in your hands or feet. ? Easy bruising. ? Having hiccups often. ? Chest pain. ? Shortness of breath. ? Stopping of menstrual periods in women.  You have a fever.  You are making very little pee (urine).  You have pain or bleeding when you pee (urinate). This information is not intended to replace advice given to you by your health care provider. Make sure you discuss any questions you have with your health care provider. Document Released: 11/11/2009 Document Revised: 01/23/2016 Document Reviewed: 04/15/2012 Elsevier Interactive Patient Education  2017 [employer].

## 2017-11-10 ENCOUNTER — Other Ambulatory Visit: Payer: Self-pay | Admitting: Pharmacy Technician

## 2017-11-10 NOTE — Patient Outreach (Signed)
[place] [facility]) Care Management  11/10/2017  YASHAS CAMILLI [birth year] [id number]   Incoming call from Mr. Peruski, [place] identifiers verified. Mr. Dickerman inquired about the letter I mailed to him in reference to his [name] application. Informed patient that he should receive the letter by the end of this week/ no later than mid next week.  [name] [place], [place] Management [phone]

## 2017-11-16 DIAGNOSIS — H40013 Open angle with borderline findings, low risk, bilateral: Secondary | ICD-10-CM | POA: Diagnosis not present

## 2017-11-16 DIAGNOSIS — E113392 Type 2 diabetes mellitus with moderate nonproliferative diabetic retinopathy without macular edema, left eye: Secondary | ICD-10-CM | POA: Diagnosis not present

## 2017-11-16 DIAGNOSIS — H35033 Hypertensive retinopathy, bilateral: Secondary | ICD-10-CM | POA: Diagnosis not present

## 2017-11-16 DIAGNOSIS — E113391 Type 2 diabetes mellitus with moderate nonproliferative diabetic retinopathy without macular edema, right eye: Secondary | ICD-10-CM | POA: Diagnosis not present

## 2017-11-16 LAB — HM DIABETES EYE EXAM

## 2017-11-18 ENCOUNTER — Other Ambulatory Visit: Payer: Self-pay | Admitting: Pharmacy Technician

## 2017-11-18 NOTE — Patient Outreach (Signed)
[name] [facility]) Care Management  11/18/2017  JOVONI BORKENHAGEN [birth year] [id number]   Received waiver letter from patient that will be submitted to [place] patient assistance since patient has not spent the required Out of pocket amount.  Faxed application to [name] 95/28  Will up with [name] in 5-7 days to check status of application.  [name] [place], [place] Management [phone]

## 2017-11-25 ENCOUNTER — Other Ambulatory Visit: Payer: Self-pay | Admitting: Pharmacy Technician

## 2017-11-25 NOTE — Patient Outreach (Signed)
[place] [facility]) Care Management  11/25/2017  SENG LARCH Aug 22, 1932 [id number]   Spoke to Zigmund Daniel at [facility] office and informed her that patient has been approved for Humulin R and N and that [name] will be mailing it to office. She stated that typically they don't have patients medications delivered to the office. I informed her that this is a process that is required by the company. She stated she would be on the lookout for the meds and contact the patient once received.  Have patient scheduled to follow up with in the next 2 weeks.  [name] [place], [place] Management [phone]

## 2017-11-25 NOTE — Patient Outreach (Signed)
[place] [facility]) Care Management  11/25/2017  Manuel Kline [birth year] [id number]   Successful outreach call to patients wife, [name], [place] identifiers verified. Informed [name] that [name] has been approved thru [place] patient assistance for his Humulin N and R. He will receive 9 vials at a time and approved thru 08/30/18. I also informed her that it will be shipped to [facility] office and will inform their staff to contact them once its received.  Will follow up in 2 weeks to verify medication has been delivered for case closure.  [name] [place], [place] Management [phone]

## 2017-12-06 DIAGNOSIS — G4733 Obstructive sleep apnea (adult) (pediatric): Secondary | ICD-10-CM | POA: Diagnosis not present

## 2017-12-09 ENCOUNTER — Other Ambulatory Visit: Payer: Self-pay | Admitting: Pharmacy Technician

## 2017-12-09 NOTE — Patient Outreach (Signed)
[place] [facility]) Care Management  12/09/2017  SHERMAR FRIEDLAND [birth year] [id number]   Contacted patients wife, Deneise Lever, HIPAA identifiers verified. Informed Mrs. Isola that [employer] cares has not mailed patients insulin out as of yet. However I will contact them again on Monday 04/15 for shipment details.  [name] [place], [place] Management [phone]

## 2017-12-09 NOTE — Patient Outreach (Signed)
[facility]) Care Management  12/09/2017  NAT LOWENTHAL [birth year] [id number]   Successful outreach call to patients wife, Deneise Lever, HIPAA identifiers verified. Following up in reference to receiving the patients Humulin N and R that was delivered to [employer] office. Mrs. Clute stated that they have not received a call from the Dr's office as of yet.   Will follow up with Dr. Zeb Comfort office.  [name] [place], [place] Management [phone]

## 2017-12-09 NOTE — Patient Outreach (Signed)
[place] [facility]) Care Management  12/09/2017  Manuel Kline September 10, 1931 [id number]   Contacted Dr. Zeb Comfort office and spoke to Triage nurse who stated that patients insulin has not been delivered. Will follow up with [name] and check order status.  [name] [place], [place] Management [phone]

## 2017-12-17 ENCOUNTER — Other Ambulatory Visit: Payer: Self-pay

## 2017-12-17 NOTE — Patient Outreach (Signed)
[facility] [facility]) Care Management  [place]  12/17/2017   Manuel Kline [birth year] [id number]  Subjective: Telephone call to the patient for assessment.  HIPAA verified.  The patient states that he is doing well except for some pain in his foot that he rates a 5/10.  HE denies any falls, breathing issues or swelling.  He weighed himself today and he is staying steady at 230.  He is adherent with his medications.  He states that his blood sugar this morning was 55.  He drank some orange juice and ate his breakfast and it went to 73.  RN Health Coach discussed the signs and symptoms of hypoglycemia and stressed the importance of eating three meals a day.  The patient verbalized understanding  And stated that he has a medical alert necklace that he wears.  He states that he is staying active by working in his yard.   The patient has an appointment to recheck his diabetes in may and a follow up appointment in six month with Dr Lovena Le.  Encounter Medications:  Outpatient Encounter Medications as of 12/17/2017  Medication Sig  . allopurinol (ZYLOPRIM) 300 MG tablet TAKE 1/2 (ONE-HALF) TABLET BY MOUTH ONCE DAILY  . aspirin 81 MG tablet Take 81 mg by mouth daily.   Marland Kitchen atorvastatin (LIPITOR) 80 MG tablet Take 0.5 tablets (40 mg total) by mouth daily.  . cloNIDine (CATAPRES) 0.1 MG tablet Take 1 tablet by mouth 2 (two) times daily.  . clotrimazole (CVS CLOTRIMAZOLE) 1 % cream Apply 1 application topically 2 (two) times daily.  . Cyanocobalamin (VITAMIN B 12 PO) Take 1,000 mcg by mouth daily.  Marland Kitchen docusate sodium (STOOL SOFTENER) 100 MG capsule Take 100 mg by mouth daily.  . furosemide (LASIX) 40 MG tablet Take 1 tablet by mouth 2 (two) times daily.  Marland Kitchen gabapentin (NEURONTIN) 300 MG capsule Take 300 mg by mouth daily.  Marland Kitchen glucose blood test strip Use to check blood glucose 4 times daily  . insulin NPH Human (HUMULIN N) 100 UNIT/ML injection INJECT 35 UNITS BID  . insulin regular (NOVOLIN R  RELION) 100 units/mL injection INJECT 30 UNITS SUBCUTANEOUSLY Two TIMES DAILY BEFORE MEALS  . Insulin Syringe-Needle U-100 30G X 3/16" 1 ML MISC 6 each by Does not apply route 6 (six) times daily.  . metoprolol succinate (TOPROL-XL) 50 MG 24 hr tablet Take 50 mg by mouth daily.  . nitroGLYCERIN (NITROSTAT) 0.4 MG SL tablet Place 0.4 mg under the tongue every 5 (five) minutes as needed for chest pain.  . Omega-3 Fatty Acids (FISH OIL) 1000 MG CPDR Take 1 tablet by mouth daily.  . quinapril (ACCUPRIL) 40 MG tablet Take 40 mg by mouth at bedtime.   . Multiple Vitamin (MULTIVITAMIN) tablet Take 1 tablet by mouth daily.    No facility-administered encounter medications on file as of 12/17/2017.     Functional Status:  In your present state of health, do you have any difficulty performing the following activities: 03/18/2017  Hearing? N  Vision? N  Difficulty concentrating or making decisions? N  Walking or climbing stairs? Y  Comment recent fall  Dressing or bathing? N  Doing errands, shopping? N  Comment using electric scooter at [employer] and eating ? N  Using the Toilet? N  In the past six months, have you accidently leaked urine? N  Do you have problems with loss of bowel control? N  Managing your Medications? N  Managing your Finances?  N  Housekeeping or managing your Housekeeping? N  Some recent data might be hidden    Fall/Depression Screening: Fall Risk  12/17/2017 10/20/2017 10/12/2017  Falls in the past year? No Yes Yes  Number falls in past yr: - 1 1  Injury with Fall? - No No  Comment - - -  Risk Factor Category  - - -  Risk for fall due to : - - History of fall(s);Impaired vision;Medication side effect  Risk for fall due to: Comment - - -  Follow up - Falls evaluation completed;Education provided;Falls prevention discussed Falls evaluation completed;Education provided;Falls prevention discussed  Comment - - -   PHQ 2/9 Scores 10/20/2017 10/12/2017  10/11/2017 04/19/2017 03/18/2017 12/23/2016 11/20/2016  PHQ - 2 Score 0 0 0 0 0 0 0    Assessment: Patient continues to benefit from health coach outreach for disease management and support.    THN CM Care Plan Problem One     Most Recent Value  THN Long Term Goal   in 90 days the patient will lower his a1c by 1-2 points  THN Long Term Goal Start Date  12/17/17  Interventions for Problem One Long Term Goal  RN Heatlh coach Reviewed fasting goals, discussed food intake and importance of eating here meals a day and compliance, reviewed signs and symptoms of hypoglycemia,   THN CM Short Term Goal #1   In 30 days the patient will verbalize that he cheks his blodd sugars on a regular basis  THN CM Short Term Goal #1 Start Date  12/17/17  [facility] CM Short Term Goal #1 Met Date  12/17/17  Interventions for Short Term Goal #1  pateint states that he checks his blood sugrs regularly  THN CM Short Term Goal #2   In 30 days the patient will verbalize that he will watch his food intake  THN CM Short Term Goal #2 Start Date  12/17/17  [facility] CM Short Term Goal #2 Met Date  12/17/17  Interventions for Short Term Goal #2  Patient states that he has a family member that comes and help cook meals       Plan: RN Health Coach will contact patient in the month of May and patient agrees to next outreach.   [name] RN, BSN, [place] Direct Dial:  [phone]  Fax: [phone]

## 2017-12-20 ENCOUNTER — Other Ambulatory Visit: Payer: Self-pay | Admitting: Pharmacy Technician

## 2017-12-20 NOTE — Patient Outreach (Signed)
[place] [facility]) Care Management  12/20/2017  Manuel Kline 06-May-1932 [id number]   12/17/17  Contacted [name] to check order status of patients Humulin N and R. SPoke to [place] who stated that the enrollment was denied per Pharmacists however she resubmitted the applications to the Special exception department and was able to get them both approved as of 12/17/17.  12/20/17  Spoke to [place] at [employer] who verified on 12/17/17 and that drug should be delivered to Dr. Gabriel Carina in the next 7-10 business days.  [name] [place], [place] Management [phone]

## 2017-12-22 ENCOUNTER — Encounter: Payer: Self-pay | Admitting: Pediatrics

## 2017-12-22 ENCOUNTER — Ambulatory Visit (INDEPENDENT_AMBULATORY_CARE_PROVIDER_SITE_OTHER): Payer: Medicare Other | Admitting: Pediatrics

## 2017-12-22 VITALS — BP 114/69 | HR 66 | Temp 97.9°F | Ht 64.0 in | Wt 235.8 lb

## 2017-12-22 DIAGNOSIS — J069 Acute upper respiratory infection, unspecified: Secondary | ICD-10-CM | POA: Diagnosis not present

## 2017-12-22 DIAGNOSIS — K5900 Constipation, unspecified: Secondary | ICD-10-CM

## 2017-12-22 MED ORDER — POLYETHYLENE GLYCOL 3350 17 GM/SCOOP PO POWD: 17.0000 g | Freq: Two times a day (BID) | ORAL | 1 refills | Status: AC | PRN

## 2017-12-22 NOTE — Patient Instructions (Addendum)
High-Fiber Diet Fiber, also called dietary fiber, is a type of carbohydrate found in fruits, vegetables, whole grains, and beans. A high-fiber diet can have many health benefits. Your health care provider may recommend a high-fiber diet to help:  Prevent constipation. Fiber can make your bowel movements more regular.  Lower your cholesterol.  Relieve hemorrhoids, uncomplicated diverticulosis, or irritable bowel syndrome.  Prevent overeating as part of a weight-loss plan.  Prevent heart disease, type 2 diabetes, and certain cancers.  What is my plan? The recommended daily intake of fiber includes:  38 grams for men under age 1.  4 grams for men over age 82.  35 grams for women under age 82.  82 grams for women over age 82.  You can get the recommended daily intake of dietary fiber by eating a variety of fruits, vegetables, grains, and beans. Your health care provider may also recommend a fiber supplement if it is not possible to get enough fiber through your diet. What do I need to know about a high-fiber diet?  Fiber supplements have not been widely studied for their effectiveness, so it is better to get fiber through food sources.  Always check the fiber content on thenutrition facts label of any prepackaged food. Look for foods that contain at least 5 grams of fiber per serving.  Ask your dietitian if you have questions about specific foods that are related to your condition, especially if those foods are not listed in the following section.  Increase your daily fiber consumption gradually. Increasing your intake of dietary fiber too quickly may cause bloating, cramping, or gas.  Drink plenty of water. Water helps you to digest fiber. What foods can I eat? Grains Whole-grain breads. Multigrain cereal. Oats and oatmeal. Brown rice. Barley. Bulgur wheat. [place]. Bran muffins. Popcorn. Rye wafer crackers. Vegetables Sweet potatoes. Spinach. Kale. Artichokes. Cabbage.  Broccoli. Green peas. Carrots. Squash. Fruits Berries. Pears. Apples. Oranges. Avocados. Prunes and raisins. Dried figs. Meats and Other Protein Sources [place], kidney, pinto, and soy beans. Split peas. Lentils. Nuts and seeds. Dairy Fiber-fortified yogurt. Beverages Fiber-fortified soy milk. Fiber-fortified orange juice. Other Fiber bars. The items listed above may not be a complete list of recommended foods or beverages. Contact your dietitian for more options. What foods are not recommended? Grains White bread. Pasta made with refined flour. White rice. Vegetables Fried potatoes. Canned vegetables. Well-cooked vegetables. Fruits Fruit juice. Cooked, strained fruit. Meats and Other Protein Sources Fatty cuts of meat. Fried Sales executive or fried fish. Dairy Milk. Yogurt. Cream cheese. Sour cream. Beverages Soft drinks. Other Cakes and pastries. Butter and oils. The items listed above may not be a complete list of foods and beverages to avoid. Contact your dietitian for more information. What are some tips for including high-fiber foods in my diet?  Eat a wide variety of high-fiber foods.  Make sure that half of all grains consumed each day are whole grains.  Replace breads and cereals made from refined flour or white flour with whole-grain breads and cereals.  Replace white rice with brown rice, bulgur wheat, or millet.  Start the day with a breakfast that is high in fiber, such as a cereal that contains at least 5 grams of fiber per serving.  Use beans in place of meat in soups, salads, or pasta.  Eat high-fiber snacks, such as berries, raw vegetables, nuts, or popcorn. This information is not intended to replace advice given to you by your health care provider. Make sure you discuss any  questions you have with your health care provider. Document Released: 08/17/2005 Document Revised: 01/23/2016 Document Reviewed: 01/30/2014 Elsevier Interactive Patient Education  2018  [place].  Fever reducer and headache: tylenol   Sinus pressure:  Nasal steroid such as flonase/fluticaone or nasocort daily Can also take daily antihistamine such as loratadine/claritin or cetirizine/zyrtec  Sinus rinses/irritation: [place] or similar with distilled water 2-3 times a day to clear out sinuses or Normal saline nasal spray  Sore throat:  Throat lozenges chloroseptic spray  Stick with bland foods Drink lots of fluids

## 2017-12-22 NOTE — Progress Notes (Signed)
  Subjective:   Patient ID: MACARIUS RUARK, male    DOB: Mar 27, 1932, 84 y.o.   MRN: [id number] CC: Cough and Nasal Congestion  HPI: CIRO TASHIRO is a 84 y.o. male presenting for Cough and Nasal Congestion  Throat burns off and on, coughing up yellow/orange mucus Lots of nasal congestion/discharge. No fevers. Appetite is ok.  Coughing some.  Sometimes productive.  Having to push and strain to pass bowel movements.  Has been ongoing problem.  Drinking plenty of fluids he thinks.  Was told to try prune juice by his insurance nurse he says.  Relevant past medical, surgical, family and social history reviewed. Allergies and medications reviewed and updated. Social History   Tobacco Use  Smoking Status Never Smoker  Smokeless Tobacco Never Used   ROS: Per HPI   Objective:    BP 114/69   Pulse 66   Temp 97.9 F (36.6 C) (Oral)   Ht 5\' 4"  (1.626 m)   Wt 235 lb 12.8 oz (107 kg)   BMI 40.47 kg/m   Wt Readings from Last 3 Encounters:  12/22/17 235 lb 12.8 oz (107 kg)  11/08/17 240 lb (108.9 kg)  10/11/17 242 lb 3.2 oz (109.9 kg)    Gen: NAD, alert, cooperative with exam, NCAT, slightly congested EYES: EOMI, no conjunctival injection, or no icterus ENT:  TMs pearly gray b/l, OP without erythema LYMPH: no cervical LAD CV: NRRR, normal S1/S2 Resp: CTABL, no wheezes, normal WOB Abd: +BS, soft, NTND. no guarding or organomegaly Ext: No edema, warm Neuro: Alert and oriented, strength equal b/l UE and LE, coordination grossly normal MSK: normal muscle bulk  Assessment & Plan:  Constantin was seen today for cough and nasal congestion.  Diagnoses and all orders for this visit:  Constipation, unspecified constipation type Artie taking stool softeners.  Discussed increasing fiber in diet.  Continue to stay hydrated.  Start MiraLAX once to twice a day as needed for regular bowel movements.  Avoid prune or apple juice because of sugar content in patient with diabetes.  Acute URI Symptomatic care  discussed, return precautions discussed.  Follow up plan: As scheduled Assunta Found, MD [place]

## 2017-12-25 ENCOUNTER — Other Ambulatory Visit: Payer: Self-pay | Admitting: Pediatrics

## 2017-12-25 DIAGNOSIS — B353 Tinea pedis: Secondary | ICD-10-CM

## 2018-01-03 ENCOUNTER — Other Ambulatory Visit: Payer: Self-pay | Admitting: Pharmacy Technician

## 2018-01-03 NOTE — Patient Outreach (Addendum)
[place] [facility]) Care Management  01/03/2018  Manuel Kline 09-30-1931 [id number]  Incoming return call from patient. Patient stated that he had received his Humulin N and R from [employer]. Counseled patient on how to obtain refills. He stated he understood but would contact me if he forgot.  Will route note to [facility] for case closure.  [name] [place], [place] Management [phone]

## 2018-01-03 NOTE — Patient Outreach (Signed)
[place] [facility]) Care Management  01/03/2018  Manuel Kline [birth year] [id number]   Unsuccessful outreach call to patient in regards to receiving Humulin N and Humulin R from [place] patient assistance. HIPAA compliant voicemail left.  Will call patient back Wednesday if call is not returned.  [name] [place], [place] Management [phone]

## 2018-01-05 ENCOUNTER — Other Ambulatory Visit: Payer: Self-pay | Admitting: Pharmacist

## 2018-01-05 NOTE — Patient Outreach (Signed)
[place] [facility]) Care Management  01/05/2018  Manuel Kline 1931/11/17 [id number]  Receive notification from [place] that Mr. Schappell has successfully received his Humulin N and R from [facility] and that she counseled patient on how to obtain refills.  PLAN  Will close pharmacy episode at this time. Will send an InBasket message to [place] to let her know of this episode closure.  Harlow Asa, PharmD, [place] Management [phone]

## 2018-01-10 ENCOUNTER — Ambulatory Visit (INDEPENDENT_AMBULATORY_CARE_PROVIDER_SITE_OTHER): Payer: Medicare Other | Admitting: Pediatrics

## 2018-01-10 ENCOUNTER — Encounter: Payer: Self-pay | Admitting: Pediatrics

## 2018-01-10 VITALS — BP 130/71 | HR 67 | Temp 97.1°F | Ht 64.0 in | Wt 236.2 lb

## 2018-01-10 DIAGNOSIS — IMO0002 Reserved for concepts with insufficient information to code with codable children: Secondary | ICD-10-CM

## 2018-01-10 DIAGNOSIS — I10 Essential (primary) hypertension: Secondary | ICD-10-CM

## 2018-01-10 DIAGNOSIS — N189 Chronic kidney disease, unspecified: Secondary | ICD-10-CM | POA: Diagnosis not present

## 2018-01-10 DIAGNOSIS — E1165 Type 2 diabetes mellitus with hyperglycemia: Secondary | ICD-10-CM

## 2018-01-10 DIAGNOSIS — E1140 Type 2 diabetes mellitus with diabetic neuropathy, unspecified: Secondary | ICD-10-CM | POA: Diagnosis not present

## 2018-01-10 LAB — BAYER DCA HB A1C WAIVED: HB A1C: 7.1 % — AB (ref <7.0)

## 2018-01-10 MED ORDER — INSULIN REGULAR HUMAN 100 UNIT/ML IJ SOLN: INTRAMUSCULAR | 1 refills | Status: DC

## 2018-01-10 MED ORDER — INSULIN NPH (HUMAN) (ISOPHANE) 100 UNIT/ML ~~LOC~~ SUSP: SUBCUTANEOUS | 3 refills | Status: DC

## 2018-01-10 NOTE — Progress Notes (Signed)
  Subjective:   Patient ID: Manuel Kline, male    DOB: 11/23/1931, 84 y.o.   MRN: [id number] CC: Medical Management of Chronic Issues  HPI: Manuel Kline is a 84 y.o. male   Diabetes: Taking his insulin regularly as prescribed. Has not had trouble getting it recently.  Trying to avoid sugary foods.   CKD: Follows with nephrology.  Not taking any NSAIDs.  Elevated BMI: Trying to stay active.  Working on portion control.  Hypertension: Taking medicines regularly, tolerating well.  No shortness of breath, chest pain with exertion or at rest.  No headaches.  Relevant past medical, surgical, family and social history reviewed. Allergies and medications reviewed and updated. Social History   Tobacco Use  Smoking Status Never Smoker  Smokeless Tobacco Never Used   ROS: Per HPI   Objective:    BP 130/71   Pulse 67   Temp (!) 97.1 F (36.2 C) (Oral)   Ht '5\' 4"'$  (1.626 m)   Wt 236 lb 3.2 oz (107.1 kg)   BMI 40.54 kg/m   Wt Readings from Last 3 Encounters:  01/10/18 236 lb 3.2 oz (107.1 kg)  12/22/17 235 lb 12.8 oz (107 kg)  11/08/17 240 lb (108.9 kg)    Gen: NAD, alert, cooperative with exam, NCAT EYES: EOMI, no conjunctival injection, or no icterus CV: NRRR, normal S1/S2 Resp: CTABL, no wheezes, normal WOB Abd: +BS, soft, NTND. no guarding or organomegaly Ext: No edema, warm Neuro: Alert and oriented MSK: normal muscle bulk  Assessment & Plan:  [facility] was seen today for medical management of chronic issues.  Diagnoses and all orders for this visit:  Type 2 diabetes, uncontrolled, with neuropathy (HCC) A1c stable, 7.1.  Continue current medicines. -     Bayer DCA Hb A1c Waived -     BMP8+EGFR -     insulin NPH Human (HUMULIN N) 100 UNIT/ML injection; INJECT 35 UNITS BID -     insulin regular (HUMULIN R) 100 units/mL injection; Inject 0.3 mLs (30 Units total) into the skin 2 (two) times daily before a meal.  Chronic kidney disease, unspecified CKD stage Following with  nephrology.  Avoiding NSAIDs.  Continuing to work on diabetes and hypertension control. -     Bayer DCA Hb A1c Waived -     BMP8+EGFR -     Basic Metabolic Panel  Essential hypertension Stable, continue current medicines -     Bayer DCA Hb A1c Waived -     BMP8+EGFR  Severe obesity (BMI >= 40) (HCC) Continue lifestyle changes. -     Bayer DCA Hb A1c Waived -     BMP8+EGFR   Follow up plan: Return in about 3 months (around 04/12/2018). Assunta Found, MD [place]

## 2018-01-11 ENCOUNTER — Telehealth: Payer: Self-pay | Admitting: *Deleted

## 2018-01-11 LAB — BMP8+EGFR
BUN / CREAT RATIO: 18 (ref 10–24)
BUN: 34 mg/dL — AB (ref 8–27)
CO2: 21 mmol/L (ref 20–29)
CREATININE: 1.85 mg/dL — AB (ref 0.76–1.27)
Calcium: 9.4 mg/dL (ref 8.6–10.2)
Chloride: 98 mmol/L (ref 96–106)
GFR calc Af Amer: 38 mL/min/{1.73_m2} — ABNORMAL LOW (ref >59)
GFR, EST NON AFRICAN AMERICAN: 33 mL/min/{1.73_m2} — AB (ref >59)
Glucose: 201 mg/dL — ABNORMAL HIGH (ref 65–99)
Potassium: 4.7 mmol/L (ref 3.5–5.2)
Sodium: 138 mmol/L (ref 134–144)

## 2018-01-11 NOTE — Telephone Encounter (Signed)
Fax received [facility] [place] Rx written Novolin R 100 unit/ml inject 30 units BID before meals Insurance pays for Humulin per fax Please advise

## 2018-01-15 MED ORDER — INSULIN REGULAR HUMAN 100 UNIT/ML IJ SOLN: 30.0000 [IU] | Freq: Two times a day (BID) | INTRAMUSCULAR | 3 refills | Status: DC

## 2018-01-15 NOTE — Telephone Encounter (Signed)
Sent humalin in

## 2018-01-21 DIAGNOSIS — Z95810 Presence of automatic (implantable) cardiac defibrillator: Secondary | ICD-10-CM | POA: Diagnosis not present

## 2018-01-21 DIAGNOSIS — E1142 Type 2 diabetes mellitus with diabetic polyneuropathy: Secondary | ICD-10-CM | POA: Diagnosis not present

## 2018-01-27 ENCOUNTER — Ambulatory Visit: Payer: Self-pay

## 2018-01-31 ENCOUNTER — Ambulatory Visit (INDEPENDENT_AMBULATORY_CARE_PROVIDER_SITE_OTHER): Payer: Medicare Other | Admitting: Family Medicine

## 2018-01-31 ENCOUNTER — Encounter: Payer: Self-pay | Admitting: Family Medicine

## 2018-01-31 VITALS — BP 131/66 | HR 71 | Temp 96.7°F | Ht 64.0 in | Wt 237.0 lb

## 2018-01-31 DIAGNOSIS — D485 Neoplasm of uncertain behavior of skin: Secondary | ICD-10-CM

## 2018-01-31 NOTE — Progress Notes (Signed)
Chief Complaint  Patient presents with  . sore on right ear that won't heal    HPI  Patient presents today for Lesion on lower right ear for up to three months. At first he thought it was from sleeping on that side with his cpap tubing rubbing against his ear. However, it wont heal over several weeks. Sore. Not painful. PMH: Smoking status noted ROS: Per HPI  Objective: BP 131/66   Pulse 71   Temp (!) 96.7 F (35.9 C) (Oral)   Ht 5\' 4"  (1.626 m)   Wt 237 lb (107.5 kg)   BMI 40.68 kg/m  Gen: NAD, alert, cooperative with exam HEENT: NCAT, EOMI, PERRL 1 cm lesion R lower ear near lobe, at pinna. Superficial crater with eschar partially covering    Assessment and plan:  1. Neoplasm of uncertain behavior of skin      Orders Placed This Encounter  Procedures  . Ambulatory referral to Dermatology    Referral Priority:   Routine    Referral Type:   Consultation    Referral Reason:   Specialty Services Required    Requested Specialty:   Dermatology    Number of Visits Requested:   1    Follow up as needed.  Claretta Fraise, MD

## 2018-02-02 ENCOUNTER — Other Ambulatory Visit: Payer: Self-pay

## 2018-02-02 DIAGNOSIS — N184 Chronic kidney disease, stage 4 (severe): Secondary | ICD-10-CM | POA: Diagnosis not present

## 2018-02-02 NOTE — Patient Outreach (Signed)
[facility]) Care Management  [place]  02/02/2018   Manuel Kline September 23, 1931 [id number]  Subjective: Telephone call placed to the patient for monthly assessment.  HIPAA verified.  The patient states that he went to see  Dr. Antionette Fairy his kidney doctor today and had a urine specimen and blood work draw.  He will find out the results in a couple of days.  The patient denies any sob, swelling or falls.  His weight today was 229.  He is still being followed by [facility] for his CHF.  He checked his blood sugar and it was 200.  He states that he has a hard time with sweets. I discussed with the patient how sweets affect his blood sugars and options.  The patient and his wife verbalized understanding.  The patient is adherent with his medications and continues to do chores around his home for exercise.    The patient has an appointment with a dermatologist on Monday at 12 noon for a place on his right ear.  He has a follow up appointment with his primary physician in August.    Encounter Medications:  Outpatient Encounter Medications as of 02/02/2018  Medication Sig  . allopurinol (ZYLOPRIM) 300 MG tablet TAKE 1/2 (ONE-HALF) TABLET BY MOUTH ONCE DAILY  . aspirin 81 MG tablet Take 81 mg by mouth daily.   Marland Kitchen atorvastatin (LIPITOR) 80 MG tablet Take 0.5 tablets (40 mg total) by mouth daily.  . cloNIDine (CATAPRES) 0.1 MG tablet Take 1 tablet by mouth 2 (two) times daily.  . clotrimazole (LOTRIMIN) 1 % cream APPLY TOPICALLY TWICE DAILY  . Cyanocobalamin (VITAMIN B 12 PO) Take 1,000 mcg by mouth daily.  Marland Kitchen docusate sodium (STOOL SOFTENER) 100 MG capsule Take 100 mg by mouth daily.  . furosemide (LASIX) 40 MG tablet Take 1 tablet by mouth 2 (two) times daily.  Marland Kitchen gabapentin (NEURONTIN) 300 MG capsule Take 300 mg by mouth daily.  Marland Kitchen glucose blood test strip Use to check blood glucose 4 times daily  . insulin NPH Human (HUMULIN N) 100 UNIT/ML injection INJECT 35 UNITS BID  . insulin regular  (HUMULIN R) 100 units/mL injection Inject 0.3 mLs (30 Units total) into the skin 2 (two) times daily before a meal.  . Insulin Syringe-Needle U-100 30G X 3/16" 1 ML MISC 6 each by Does not apply route 6 (six) times daily.  . metoprolol succinate (TOPROL-XL) 50 MG 24 hr tablet Take 50 mg by mouth daily.  . Multiple Vitamin (MULTIVITAMIN) tablet Take 1 tablet by mouth daily.   . nitroGLYCERIN (NITROSTAT) 0.4 MG SL tablet Place 0.4 mg under the tongue every 5 (five) minutes as needed for chest pain.  . Omega-3 Fatty Acids (FISH OIL) 1000 MG CPDR Take 1 tablet by mouth daily.  . polyethylene glycol powder (GLYCOLAX/MIRALAX) powder Take 17 g by mouth 2 (two) times daily as needed.  . quinapril (ACCUPRIL) 40 MG tablet Take 40 mg by mouth at bedtime.    No facility-administered encounter medications on file as of 02/02/2018.     Functional Status:  In your present state of health, do you have any difficulty performing the following activities: 03/18/2017  Hearing? N  Vision? N  Difficulty concentrating or making decisions? N  Walking or climbing stairs? Y  Comment recent fall  Dressing or bathing? N  Doing errands, shopping? N  Comment using electric scooter at [employer] and eating ? N  Using the Toilet? N  In the past six months, have you accidently leaked urine? N  Do you have problems with loss of bowel control? N  Managing your Medications? N  Managing your Finances? N  Housekeeping or managing your Housekeeping? N  Some recent data might be hidden    Fall/Depression Screening: Fall Risk  02/02/2018 01/31/2018 01/10/2018  Falls in the past year? No No No  Number falls in past yr: - - -  Injury with Fall? - - -  Comment - - -  Risk Factor Category  - - -  Risk for fall due to : - - -  Risk for fall due to: Comment - - -  Follow up - - -  Comment - - -   PHQ 2/9 Scores 01/31/2018 01/10/2018 12/22/2017 10/20/2017 10/12/2017 10/11/2017 04/19/2017  PHQ - 2 Score 0 0 0 0 0 0 0     Assessment: Patient will continue to benefit from health coach outreach for disease management and support.  THN CM Care Plan Problem One     Most Recent Value  THN Long Term Goal   in 90 days the patient will lower his a1c by 1-2 points  THN Long Term Goal Start Date  02/02/18  Interventions for Problem One Long Term Goal  Reinterated with the patient and his wife about food intake, watching the carbs and sweets.  THN CM Short Term Goal #1   In 30 days the patient will verbalize that he checks his blood sugars on a regular basis  THN CM Short Term Goal #1 Start Date  02/02/18  [facility] CM Short Term Goal #1 Met Date  02/02/18  Interventions for Short Term Goal #1  patient states that he checks his blood sugars regularly     Plan:  [place] will contact patient in the month of July and patient agrees to next outreach.  [name] RN, BSN, [place] Direct Dial:  [phone]  Fax: [phone]

## 2018-02-07 DIAGNOSIS — D1801 Hemangioma of skin and subcutaneous tissue: Secondary | ICD-10-CM | POA: Diagnosis not present

## 2018-02-07 DIAGNOSIS — L280 Lichen simplex chronicus: Secondary | ICD-10-CM | POA: Diagnosis not present

## 2018-02-07 DIAGNOSIS — L570 Actinic keratosis: Secondary | ICD-10-CM | POA: Diagnosis not present

## 2018-02-07 DIAGNOSIS — D485 Neoplasm of uncertain behavior of skin: Secondary | ICD-10-CM | POA: Diagnosis not present

## 2018-02-22 ENCOUNTER — Other Ambulatory Visit: Payer: Self-pay

## 2018-03-10 ENCOUNTER — Other Ambulatory Visit: Payer: Self-pay

## 2018-03-10 DIAGNOSIS — I119 Hypertensive heart disease without heart failure: Secondary | ICD-10-CM | POA: Diagnosis not present

## 2018-03-10 DIAGNOSIS — I255 Ischemic cardiomyopathy: Secondary | ICD-10-CM | POA: Diagnosis not present

## 2018-03-10 DIAGNOSIS — I5022 Chronic systolic (congestive) heart failure: Secondary | ICD-10-CM | POA: Diagnosis not present

## 2018-03-10 DIAGNOSIS — I2510 Atherosclerotic heart disease of native coronary artery without angina pectoris: Secondary | ICD-10-CM | POA: Diagnosis not present

## 2018-03-10 NOTE — Telephone Encounter (Signed)
This encounter was created in error - please disregard.

## 2018-03-10 NOTE — Addendum Note (Signed)
Addended by: Waldon Reining on: 03/10/2018 03:26 PM   Modules accepted: Level of Service, SmartSet

## 2018-03-11 ENCOUNTER — Other Ambulatory Visit: Payer: Self-pay

## 2018-03-11 NOTE — Patient Outreach (Signed)
[facility]) Care Management  03/11/2018  Manuel Kline [birth year] [id number]   Unsuccessful attempt to the patient for assessment. Wife answered the phone and stated that the patient was unable to come to the phone at this time. HIPAA compliant voicemail left with contact information.   Plan: RN Health Coach will send letter. RN Health Coach will make another attempt to the patient within four business days.   [name] RN, BSN, [place] Direct Dial:  [phone]  Fax: [phone]

## 2018-03-14 ENCOUNTER — Other Ambulatory Visit: Payer: Self-pay

## 2018-03-14 NOTE — Patient Outreach (Signed)
[facility]) Care Management  [place]  03/14/2018   Manuel Kline Jul 24, 1932 [id number]  Subjective:    Received call from the patient and he states that he is doing ok. HIPAA verified.   He checked his blood sugar this morning and it was 160.  He states that he is monitoring his diet and trying to stay away from breads.  He is being adherent with his medications.  He denies any falls. He is very active outside the home.  I advised the patient to be careful in the heat and to stay hydrated.  The patient verbalized understanding. He states that he has been having some pain in the top of his head.  He rates the pain today at a 2/10. He has mentioned this to his physician and if the pain continues he will want him to have a CT scan.  He states that he has an appointment tomorrow to have the veins in his neck checked and an appointment at the first of August for his diabetes check.   Encounter Medications:  Outpatient Encounter Medications as of 03/14/2018  Medication Sig  . allopurinol (ZYLOPRIM) 300 MG tablet TAKE 1/2 (ONE-HALF) TABLET BY MOUTH ONCE DAILY  . aspirin 81 MG tablet Take 81 mg by mouth daily.   Marland Kitchen atorvastatin (LIPITOR) 80 MG tablet Take 0.5 tablets (40 mg total) by mouth daily.  . cloNIDine (CATAPRES) 0.1 MG tablet Take 1 tablet by mouth 2 (two) times daily.  . clotrimazole (LOTRIMIN) 1 % cream APPLY TOPICALLY TWICE DAILY  . Cyanocobalamin (VITAMIN B 12 PO) Take 1,000 mcg by mouth daily.  Marland Kitchen docusate sodium (STOOL SOFTENER) 100 MG capsule Take 100 mg by mouth daily.  . furosemide (LASIX) 40 MG tablet Take 1 tablet by mouth 2 (two) times daily.  Marland Kitchen gabapentin (NEURONTIN) 300 MG capsule Take 300 mg by mouth daily.  Marland Kitchen glucose blood test strip Use to check blood glucose 4 times daily  . insulin NPH Human (HUMULIN N) 100 UNIT/ML injection INJECT 35 UNITS BID  . insulin regular (HUMULIN R) 100 units/mL injection Inject 0.3 mLs (30 Units total) into the skin 2 (two)  times daily before a meal.  . Insulin Syringe-Needle U-100 30G X 3/16" 1 ML MISC 6 each by Does not apply route 6 (six) times daily.  . metoprolol succinate (TOPROL-XL) 50 MG 24 hr tablet Take 50 mg by mouth daily.  . Multiple Vitamin (MULTIVITAMIN) tablet Take 1 tablet by mouth daily.   . nitroGLYCERIN (NITROSTAT) 0.4 MG SL tablet Place 0.4 mg under the tongue every 5 (five) minutes as needed for chest pain.  . Omega-3 Fatty Acids (FISH OIL) 1000 MG CPDR Take 1 tablet by mouth daily.  . polyethylene glycol powder (GLYCOLAX/MIRALAX) powder Take 17 g by mouth 2 (two) times daily as needed.  . quinapril (ACCUPRIL) 40 MG tablet Take 40 mg by mouth at bedtime.    No facility-administered encounter medications on file as of 03/14/2018.     Functional Status:  In your present state of health, do you have any difficulty performing the following activities: 03/18/2017  Hearing? N  Vision? N  Difficulty concentrating or making decisions? N  Walking or climbing stairs? Y  Comment recent fall  Dressing or bathing? N  Doing errands, shopping? N  Comment using electric scooter at [employer] and eating ? N  Using the Toilet? N  In the past six months, have you accidently leaked urine? N  Do you have problems with loss of bowel control? N  Managing your Medications? N  Managing your Finances? N  Housekeeping or managing your Housekeeping? N  Some recent data might be hidden    Fall/Depression Screening: Fall Risk  02/02/2018 01/31/2018 01/10/2018  Falls in the past year? No No No  Number falls in past yr: - - -  Injury with Fall? - - -  Comment - - -  Risk Factor Category  - - -  Risk for fall due to : - - -  Risk for fall due to: Comment - - -  Follow up - - -  Comment - - -   PHQ 2/9 Scores 01/31/2018 01/10/2018 12/22/2017 10/20/2017 10/12/2017 10/11/2017 04/19/2017  PHQ - 2 Score 0 0 0 0 0 0 0    Assessment: Patient will continue to benefit from health coach outreach for  disease management and support.  THN CM Care Plan Problem One     Most Recent Value  THN Long Term Goal   in 90 days the patient will lower his a1c by 1-2 points  Interventions for Problem One Long Term Goal  discussed with the patient about dietary modifications, medication adherence      Plan: [place] will contact patient in the month of August and patient agrees to next outreach.  [name] RN, BSN, [place] Direct Dial:  [phone]  Fax: [phone]

## 2018-03-15 ENCOUNTER — Other Ambulatory Visit: Payer: Self-pay | Admitting: Cardiology

## 2018-03-15 ENCOUNTER — Ambulatory Visit (HOSPITAL_COMMUNITY)
Admission: RE | Admit: 2018-03-15 | Discharge: 2018-03-15 | Disposition: A | Discharge status: Home / Self Care | Payer: Medicare Other | Source: Ambulatory Visit | Attending: Vascular Surgery | Admitting: Vascular Surgery

## 2018-03-15 DIAGNOSIS — I6523 Occlusion and stenosis of bilateral carotid arteries: Secondary | ICD-10-CM | POA: Diagnosis not present

## 2018-03-15 DIAGNOSIS — I6529 Occlusion and stenosis of unspecified carotid artery: Secondary | ICD-10-CM | POA: Diagnosis not present

## 2018-03-17 ENCOUNTER — Ambulatory Visit: Payer: Self-pay

## 2018-03-23 ENCOUNTER — Ambulatory Visit (INDEPENDENT_AMBULATORY_CARE_PROVIDER_SITE_OTHER): Payer: Medicare Other | Admitting: *Deleted

## 2018-03-23 ENCOUNTER — Encounter: Payer: Self-pay | Admitting: *Deleted

## 2018-03-23 VITALS — BP 121/76 | HR 60 | Ht 64.0 in | Wt 237.0 lb

## 2018-03-23 DIAGNOSIS — Z0000 Encounter for general adult medical examination without abnormal findings: Secondary | ICD-10-CM

## 2018-03-23 NOTE — Patient Instructions (Signed)
  Manuel Kline , Thank you for taking time to come for your Medicare Wellness Visit. I appreciate your ongoing commitment to your health goals. Please review the following plan we discussed and let me know if I can assist you in the future.   These are the goals we discussed: Goals    . Increase physical activity     Will start with referral to physical therapy to help with balance.        This is a list of the screening recommended for you and due dates:  Health Maintenance  Topic Date Due  . Tetanus Vaccine  12/23/2018*  . Flu Shot  03/31/2018  . Hemoglobin A1C  07/13/2018  . Complete foot exam   10/11/2018  . Eye exam for diabetics  11/17/2018  . Pneumonia vaccines  Completed  *Topic was postponed. The date shown is not the original due date.

## 2018-03-23 NOTE — Progress Notes (Signed)
Subjective:   Manuel Kline is a 84 y.o. male who presents for a Medicare Annual Wellness Visit. Manuel Kline lives at home  with his wife who accompanies him today. 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 He and his wife are very active in the [employer]. 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 His wife is the Higher education careers adviser.  He has four adult children , several grandchildren, and great grandchildren.   Review of Systems    Patient reports that his overall health is unchanged compared to last year.  Cardiac Risk Factors include: advanced age (>61men, >82 women);dyslipidemia;male gender;hypertension;diabetes mellitus;sedentary lifestyle;obesity (BMI >30kg/m2). See Dr Wynonia Lawman routinely. Recent carotid dopplers but has not heard from the results.   Nephrology: sees nephrologist every 6 months for CKD.   All other systems negative       Current Medications (verified) Outpatient Encounter Medications as of 03/23/2018  Medication Sig  . allopurinol (ZYLOPRIM) 300 MG tablet TAKE 1/2 (ONE-HALF) TABLET BY MOUTH ONCE DAILY  . aspirin 81 MG tablet Take 81 mg by mouth daily.   Marland Kitchen atorvastatin (LIPITOR) 80 MG tablet Take 0.5 tablets (40 mg total) by mouth daily.  . cloNIDine (CATAPRES) 0.1 MG tablet Take 1 tablet by mouth 2 (two) times daily.  . clotrimazole (LOTRIMIN) 1 % cream APPLY TOPICALLY TWICE DAILY  . Cyanocobalamin (VITAMIN B 12 PO) Take 1,000 mcg by mouth daily.  Marland Kitchen docusate sodium (STOOL SOFTENER) 100 MG capsule Take 100 mg by mouth daily.  . furosemide (LASIX) 40 MG tablet Take 1 tablet by mouth 2 (two) times daily.  Marland Kitchen gabapentin (NEURONTIN) 300 MG capsule Take 300 mg by mouth daily.  Marland Kitchen glucose blood test strip Use to check blood glucose 4 times daily  . insulin NPH Human (HUMULIN N) 100 UNIT/ML injection INJECT 35 UNITS BID  . insulin regular (HUMULIN R) 100 units/mL injection Inject 0.3 mLs (30 Units total) into the skin  2 (two) times daily before a meal.  . Insulin Syringe-Needle U-100 30G X 3/16" 1 ML MISC 6 each by Does not apply route 6 (six) times daily.  . metoprolol succinate (TOPROL-XL) 50 MG 24 hr tablet Take 50 mg by mouth daily.  . Multiple Vitamin (MULTIVITAMIN) tablet Take 1 tablet by mouth daily.   . nitroGLYCERIN (NITROSTAT) 0.4 MG SL tablet Place 0.4 mg under the tongue every 5 (five) minutes as needed for chest pain.  . Omega-3 Fatty Acids (FISH OIL) 1000 MG CPDR Take 1 tablet by mouth daily.  . polyethylene glycol powder (GLYCOLAX/MIRALAX) powder Take 17 g by mouth 2 (two) times daily as needed.  . quinapril (ACCUPRIL) 40 MG tablet Take 40 mg by mouth at bedtime.    No facility-administered encounter medications on file as of 03/23/2018.     Allergies (verified) Patient has no known allergies.   History: Past Medical History:  Diagnosis Date  . Adenomatous colon polyp 2006  . CAD (coronary artery disease)   . Calcium oxalate renal stones   . Cardiomyopathy   . Cataract   . Diabetes ([place])   . Erectile dysfunction   . Erosive esophagitis   . Hemorrhoids   . HLD (hyperlipidemia)   . HTN (hypertension)   . Hypertensive heart disease without CHF 07/31/2011  . ICD (implantable cardiac defibrillator) in place   . ICD dual chamber in situ   . Metabolic syndrome   . Morbid obesity (  [place])   . Osteoarthritis   . S/P CABG (coronary artery bypass graft) 11/02/2000  . Sleep apnea    Past Surgical History:  Procedure Laterality Date  . ABDOMINAL EXPLORATION SURGERY    . BACK SURGERY     X'3  . cardiac bypass    . CARDIAC DEFIBRILLATOR PLACEMENT    . CARPAL TUNNEL RELEASE     X2, bilateral  . CATARACT EXTRACTION    . COLONOSCOPY  06/20/2012   Procedure: COLONOSCOPY;  Surgeon: Sable Feil, MD;  Location: [facility];  Service: Endoscopy;  Laterality: N/A;  . DOPPLER ECHOCARDIOGRAPHY  2003  . EP IMPLANTABLE DEVICE N/A 07/14/2016   Procedure: ICD Generator Changeout;  Surgeon:  Evans Lance, MD;  Location: [facility];  Service: Cardiovascular;  Laterality: N/A;  . ESOPHAGOGASTRODUODENOSCOPY  06/20/2012   Procedure: ESOPHAGOGASTRODUODENOSCOPY (EGD);  Surgeon: Sable Feil, MD;  Location: [facility];  Service: Endoscopy;  Laterality: N/A;  . EYE SURGERY    . LAPAROTOMY    . RETINOPATHY SURGERY Bilateral   . rotator cuff surgery     left   Family History  Problem Relation Age of Onset  . Diabetes Brother   . Heart disease Father   . Heart disease Mother   . Diabetes Mother   . Diabetes Brother   . Diabetes Brother   . Cancer Brother        baldder  . Heart disease Brother   . Diabetes Sister   . Diabetes Sister   . Kidney disease Sister        dialysis  . Diabetes Sister   . Diabetes Sister   . Diabetes Sister   . Throat cancer Paternal Uncle    Social History   Socioeconomic History  . Marital status: Married    Spouse name: Not on file  . Number of children: 4  . Years of education: 43  . Highest education level: 11th grade  Occupational History  . Occupation: Retired from [employer]: RETIRED  Social Needs  . Financial resource strain: Not hard at all  . Food insecurity:    Worry: Never true    Inability: Never true  . Transportation needs:    Medical: No    Non-medical: No  Tobacco Use  . Smoking status: Never Smoker  . Smokeless tobacco: Never Used  Substance and Sexual Activity  . Alcohol use: No    Alcohol/week: 0.0 oz  . Drug use: No  . Sexual activity: Yes  Lifestyle  . Physical activity:    Days per week: 0 days    Minutes per session: 0 min  . Stress: Not at all  Relationships  . Social connections:    Talks on phone: More than three times a week    Gets together: More than three times a week    Attends religious service: More than 4 times per year    Active member of club or organization: Yes    Attends meetings of clubs or organizations: More than 4 times per year     Relationship status: Married  Other Topics Concern  . Not on file  Social History Narrative   Right handed, Married, 4 kids from previous marriage.  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 Caffeine 1 cup daily.    Tobacco Use No.  Clinical Intake:     Nutritional Status: BMI > 30  Obese Diabetes: Yes  What is the last grade level you completed in school?:  11     Information entered by :: Chong Sicilian, RN  Activities of Daily Living In your present state of health, do you have any difficulty performing the following activities: 03/23/2018 03/23/2018  Hearing? [name]  Vision? N N  Difficulty concentrating or making decisions? N -  Walking or climbing stairs? N -  Dressing or bathing? N -  Doing errands, shopping? N -  Preparing Food and eating ? N -  Using the Toilet? N -  In the past six months, have you accidently leaked urine? N -  Do you have problems with loss of bowel control? N -  Managing your Medications? N -  Managing your Finances? N -  Housekeeping or managing your Housekeeping? N -  Some recent data might be hidden     Diet Eats 3 meals a day and snacks. Most meals are prepared at home.  Mostly drinks water and a sugar free lemonade   Exercise Current Exercise Habits: The patient does not participate in regular exercise at present, Exercise limited by: orthopedic condition(s);neurologic condition(s)    Depression Screen PHQ 2/9 Scores 03/23/2018 01/31/2018 01/10/2018 12/22/2017  PHQ - 2 Score 0 0 0 0     Fall Risk Fall Risk  03/23/2018 02/02/2018 01/31/2018 01/10/2018 12/22/2017  Falls in the past year? No No No No No  Number falls in past yr: - - - - -  Injury with Fall? - - - - -  Comment - - - - -  Risk Factor Category  - - - - -  Risk for fall due to : - - - - -  Risk for fall due to: Comment - - - - -  Follow up - - - - -  Comment - - - - -    Safety Is the patient's home free of loose throw rugs in walkways, pet beds, electrical cords, etc?   yes      Grab bars in  the bathroom? yes      Walkin shower? yes      Shower Seat? yes      Handrails on the stairs?   yes      Adequate lighting?   yes  Patient Care Team: Eustaquio Maize, MD as PCP - General (Family Medicine) Regenia Skeeter, MD as Consulting Physician (Internal Medicine) Paulla Dolly Tamala Fothergill, DPM as Consulting Physician (Podiatry) Jacolyn Reedy, MD as Consulting Physician (Cardiology) Zadie Rhine Clent Demark, MD as Consulting Physician (Ophthalmology) Evans Lance, MD as Consulting Physician (Cardiology) Gardiner Barefoot, DPM as Consulting Physician (Podiatry) [name], RN as [place] Management  Hospitalizations, surgeries, and ER visits in previous 12 months No hospitalizations, ER visits, or surgeries this past year.    Objective:    Today's Vitals   03/23/18 1024  BP: 121/76  Pulse: 60  Weight: 237 lb (107.5 kg)  Height: 5\' 4"  (1.626 m)   Body mass index is 40.68 kg/m.  Advanced Directives 03/23/2018 10/20/2017 10/12/2017 03/18/2017 07/14/2016 03/16/2016 03/01/2015  Does Patient Have a Medical Advance Directive? Yes Yes Yes Yes Yes Yes Yes  Type of [place] administrator Power of [place];Living will Healthcare Power of [place];Living will [place];Living will  Does patient want to make changes to medical advance directive? - - No - Patient declined No - Patient declined No - Patient declined No - Patient declined No -  Patient declined  Copy of [place] in Chart? Yes - - No - copy requested No - copy requested No - copy requested No - copy requested    Hearing/Vision  normal or No deficits noted during visit.   Cognitive Function: MMSE - Mini Mental State Exam 03/23/2018 03/18/2017 03/16/2016 03/01/2015  Orientation to time 5 5 5 5   Orientation to Place 5 5 5 5   Registration 3 3 3 3   Attention/  Calculation 0 3 4 3   Attention/Calculation-comments not attempted - - -  Recall 2 3 3 3   Language- name 2 objects 2 2 2 2   Language- repeat 1 1 1 1   Language- follow 3 step command 3 3 3 3   Language- read & follow direction 1 1 1 1   Write a sentence 1 1 1 1   Copy design 1 1 1 1   Total score 24 28 29 28        Normal Cognitive Function Screening: Yes    Immunizations and Health Maintenance Immunization History  Administered Date(s) Administered  . Influenza, High Dose Seasonal PF 06/14/2017  . Influenza,inj,Quad PF,6+ Mos 06/12/2014, 06/24/2015, 06/17/2016  . Pneumococcal Conjugate-13 03/01/2015  . Pneumococcal Polysaccharide-23 03/16/2016   There are no preventive care reminders to display for this patient. Health Maintenance  Topic Date Due  . TETANUS/TDAP  12/23/2018 (Originally 01/17/1951)  . INFLUENZA VACCINE  03/31/2018  . HEMOGLOBIN A1C  07/13/2018  . FOOT EXAM  10/11/2018  . OPHTHALMOLOGY EXAM  11/17/2018  . PNA vac Low Risk Adult  Completed        Assessment:   This is a routine wellness examination for [place].      Plan:    Goals    . Increase physical activity     Will start with referral to physical therapy to help with balance.         Health Maintenance Recommendations: Td vaccine -Not given. Will see someone if injured  Additional Screening Recommendations: Lung: Low Dose CT Chest recommended if Age 49-80 years, 30 pack-year currently smoking OR have quit w/in 15years. Patient does not qualify. Hepatitis C Screening recommended: no  Today's Orders No orders of the defined types were placed in this encounter.   Keep f/u with Eustaquio Maize, MD and any other specialty appointments you may have Continue current medications Move carefully to avoid falls. Use assistive devices like a cane or walker if needed. Aim for at least 150 minutes of moderate activity a week. This can be chair exercises if necessary. Reading or puzzles are a good way  to exercise your brain Stay connected with friends and family. Social connections are beneficial to your emotional and mental health.   I have personally reviewed and noted the following in the patient's chart:   . Medical and social history . Use of alcohol, tobacco or illicit drugs  . Current medications and supplements . Functional ability and status . Nutritional status . Physical activity . Advanced directives . List of other physicians . Hospitalizations, surgeries, and ER visits in previous 12 months . Vitals . Screenings to include cognitive, depression, and falls . Referrals and appointments  In addition, I have reviewed and discussed with patient certain preventive protocols, quality metrics, and best practice recommendations. A written personalized care plan for preventive services as well as general preventive health recommendations were provided to patient.     Chong Sicilian, RN   03/23/2018

## 2018-04-07 ENCOUNTER — Ambulatory Visit: Payer: Medicare Other | Admitting: Pediatrics

## 2018-04-11 ENCOUNTER — Encounter: Payer: Self-pay | Admitting: Pediatrics

## 2018-04-11 ENCOUNTER — Ambulatory Visit (INDEPENDENT_AMBULATORY_CARE_PROVIDER_SITE_OTHER): Payer: Medicare Other | Admitting: Pediatrics

## 2018-04-11 VITALS — BP 121/75 | HR 71 | Temp 97.1°F | Ht 64.0 in | Wt 236.0 lb

## 2018-04-11 DIAGNOSIS — N184 Chronic kidney disease, stage 4 (severe): Secondary | ICD-10-CM

## 2018-04-11 DIAGNOSIS — Z8739 Personal history of other diseases of the musculoskeletal system and connective tissue: Secondary | ICD-10-CM | POA: Diagnosis not present

## 2018-04-11 DIAGNOSIS — G629 Polyneuropathy, unspecified: Secondary | ICD-10-CM

## 2018-04-11 DIAGNOSIS — M79672 Pain in left foot: Secondary | ICD-10-CM

## 2018-04-11 DIAGNOSIS — B353 Tinea pedis: Secondary | ICD-10-CM | POA: Diagnosis not present

## 2018-04-11 DIAGNOSIS — E1165 Type 2 diabetes mellitus with hyperglycemia: Secondary | ICD-10-CM

## 2018-04-11 DIAGNOSIS — I5022 Chronic systolic (congestive) heart failure: Secondary | ICD-10-CM | POA: Diagnosis not present

## 2018-04-11 DIAGNOSIS — IMO0002 Reserved for concepts with insufficient information to code with codable children: Secondary | ICD-10-CM

## 2018-04-11 DIAGNOSIS — E1140 Type 2 diabetes mellitus with diabetic neuropathy, unspecified: Secondary | ICD-10-CM

## 2018-04-11 MED ORDER — INSULIN NPH (HUMAN) (ISOPHANE) 100 UNIT/ML ~~LOC~~ SUSP: SUBCUTANEOUS | 3 refills | Status: DC

## 2018-04-11 MED ORDER — CLOTRIMAZOLE 1 % EX CREA: TOPICAL_CREAM | Freq: Two times a day (BID) | CUTANEOUS | 0 refills | Status: DC

## 2018-04-11 MED ORDER — ALLOPURINOL 300 MG PO TABS: ORAL_TABLET | ORAL | 1 refills | Status: DC

## 2018-04-11 MED ORDER — GABAPENTIN 300 MG PO CAPS: 300.0000 mg | ORAL_CAPSULE | Freq: Two times a day (BID) | ORAL | 1 refills | Status: DC

## 2018-04-11 MED ORDER — INSULIN REGULAR HUMAN 100 UNIT/ML IJ SOLN: 30.0000 [IU] | Freq: Two times a day (BID) | INTRAMUSCULAR | 3 refills | Status: DC

## 2018-04-11 NOTE — Progress Notes (Signed)
Subjective:   Patient ID: Manuel Kline, male    DOB: [birth year], 84 y.o.   MRN: [id number] CC: Medical Management of Chronic Issues  HPI: Manuel Kline is a 84 y.o. male   Diabetes: Eating some sugary foods off and on.  Taking his insulin regularly.  When he does forget his insulin his sugars are quite high, up into the 300s.  Fasting blood sugars in the morning range from 96 up to 160s for the most part for the last few weeks.  CKD: Following with nephrology.  Avoiding NSAIDs.  Left toe pain: Ongoing since toenail was removed.  Feels tight at times.  Taking gabapentin twice a day.  He is not sure if it helps with the toe pain.  He has numbness and neuropathy symptoms bilaterally on his feet.  No redness.  No swelling or discharge.  Chronic systolic heart failure: Following with cardiology.  Has an ICD implanted.  No swelling in his feet.  Relevant past medical, surgical, family and social history reviewed. Allergies and medications reviewed and updated. Social History   Tobacco Use  Smoking Status Never Smoker  Smokeless Tobacco Never Used   ROS: Per HPI   Objective:    BP 121/75   Pulse 71   Temp (!) 97.1 F (36.2 C) (Oral)   Ht 5\' 4"  (1.626 m)   Wt 236 lb (107 kg)   BMI 40.51 kg/m   Wt Readings from Last 3 Encounters:  04/11/18 236 lb (107 kg)  03/23/18 237 lb (107.5 kg)  01/31/18 237 lb (107.5 kg)    Gen: NAD, alert, cooperative with exam, NCAT EYES: EOMI, no conjunctival injection, or no icterus ENT: OP without erythema LYMPH: no cervical LAD CV: NRRR, normal S1/S2, no murmur, distal pulses 2+ b/l Resp: CTABL, no wheezes, normal WOB Ext: No edema, warm Neuro: Alert and oriented, strength equal b/l UE and LE, coordination grossly normal Skin: Slightly thickened toenails left foot, no toenail present first left toe.  No redness or tenderness.  Assessment & Plan:  [name] was seen today for medical management of chronic issues.  Diagnoses and all orders for this  visit:  Type 2 diabetes, uncontrolled, with neuropathy (HCC) A1c slightly elevated, up to 8.6.  Up from 7.1 last check.  Continue insulin.  Check twice a day before getting himself shots.  Refer to nutrition.  Some blood sugars below 100 in the morning, hesitant to increase insulin at this time.  If still remains elevated will refer to endocrinology. -     Bayer DCA Hb A1c Waived -     Microalbumin / creatinine urine ratio -     insulin NPH Human (HUMULIN N) 100 UNIT/ML injection; INJECT 35 UNITS BID -     insulin regular (HUMULIN R) 100 units/mL injection; Inject 0.3 mLs (30 Units total) into the skin 2 (two) times daily before a meal. -     Amb ref to Medical Nutrition Therapy-MNT  Tinea pedis of both feet -     clotrimazole (LOTRIMIN) 1 % cream; Apply topically 2 (two) times daily.  H/O: gout -     allopurinol (ZYLOPRIM) 300 MG tablet; TAKE 1/2 (ONE-HALF) TABLET BY MOUTH ONCE DAILY  Neuropathy Stable, continue below. -     gabapentin (NEURONTIN) 300 MG capsule; Take 1 capsule (300 mg total) by mouth 2 (two) times daily.  Left foot pain -     Ambulatory referral to Podiatry  Chronic systolic CHF (congestive heart failure), NYHA class 2 (  [place]) Euvolemic today, on ACE inhibitor, statin, beta-blocker  CKD (chronic kidney disease) stage 4, GFR 15-29 ml/min (HCC) Stable, following with nephrology.  Continue to try to improve diabetes control.   Follow up plan: Return in about 3 months (around 07/12/2018). Assunta Found, MD [place]

## 2018-04-12 LAB — BAYER DCA HB A1C WAIVED: HB A1C: 8.6 % — AB (ref <7.0)

## 2018-04-12 LAB — MICROALBUMIN / CREATININE URINE RATIO
Creatinine, Urine: 41.8 mg/dL
Microalb/Creat Ratio: 15.3 mg/g creat (ref 0.0–30.0)
Microalbumin, Urine: 6.4 ug/mL

## 2018-04-18 ENCOUNTER — Other Ambulatory Visit: Payer: Self-pay

## 2018-04-18 NOTE — Patient Outreach (Signed)
[name] Care Management  04/18/2018  Manuel Kline 07/15/1932 [id number]    1st outreach attempt to the patient for assessment. Wife answered the phone and stated that the patient is not at home.  HIPAA compliant message left with contact information.  Plan: RN Health Coach will send letter. RN Health Coach will make another attempt to the patient within four business days.  Manuel Arms RN, BSN, [place] Direct Dial:  [phone]  Fax: [phone]

## 2018-04-18 NOTE — Patient Outreach (Signed)
Manuel Kline & Manuel Kline [facility]) Care Management  04/18/2018   Manuel Kline 1931-11-10 [id number]  Subjective: Return call from the patient for assessment. HIPAA verified.  The patient states that he has been doing fair.  He denies any pain or falls. He states that he did not check his blood sugar today but yesterday it was 200.  He saw his physician on 8/12 and his a1c was 8.6.  His wife who was on the call ( with his permission) states that he has been eating a lot of sweets and not watching what he has been eating. I explained to the patient how his food intake will affect his blood sugars.  His wife stated that Dr Evette Doffing has set up an appointment to see the nutritionist. They have called the home waiting from a return call from the patient.  I advised him to call them back because he will be able to learn a lot about his food intake. He verbalized understanding.  He has a follow up appointment with Dr Evette Doffing on 11/19.   Current Medications:  Current Outpatient Medications  Medication Sig Dispense Refill  . allopurinol (ZYLOPRIM) 300 MG tablet TAKE 1/2 (ONE-HALF) TABLET BY MOUTH ONCE DAILY 45 tablet 1  . aspirin 81 MG tablet Take 81 mg by mouth daily.     Marland Kitchen atorvastatin (LIPITOR) 80 MG tablet Take 0.5 tablets (40 mg total) by mouth daily. 90 tablet 3  . cloNIDine (CATAPRES) 0.1 MG tablet Take 1 tablet by mouth 2 (two) times daily.    . clotrimazole (LOTRIMIN) 1 % cream Apply topically 2 (two) times daily. 45 g 0  . Cyanocobalamin (VITAMIN B 12 PO) Take 1,000 mcg by mouth daily.    Marland Kitchen docusate sodium (STOOL SOFTENER) 100 MG capsule Take 100 mg by mouth daily.    . furosemide (LASIX) 40 MG tablet Take 1 tablet by mouth 2 (two) times daily.    Marland Kitchen gabapentin (NEURONTIN) 300 MG capsule Take 1 capsule (300 mg total) by mouth 2 (two) times daily. 180 capsule 1  . glucose blood test strip Use to check blood glucose 4 times daily 400 each 0  . insulin NPH Human (HUMULIN N) 100 UNIT/ML injection INJECT  35 UNITS BID 30 mL 3  . insulin regular (HUMULIN R) 100 units/mL injection Inject 0.3 mLs (30 Units total) into the skin 2 (two) times daily before a meal. 60 mL 3  . Insulin Syringe-Needle U-100 30G X 3/16" 1 ML MISC 6 each by Does not apply route 6 (six) times daily. 200 each 5  . metoprolol succinate (TOPROL-XL) 50 MG 24 hr tablet Take 50 mg by mouth daily.    . Multiple Vitamin (MULTIVITAMIN) tablet Take 1 tablet by mouth daily.     . nitroGLYCERIN (NITROSTAT) 0.4 MG SL tablet Place 0.4 mg under the tongue every 5 (five) minutes as needed for chest pain.    . Omega-3 Fatty Acids (FISH OIL) 1000 MG CPDR Take 1 tablet by mouth daily.    . polyethylene glycol powder (GLYCOLAX/MIRALAX) powder Take 17 g by mouth 2 (two) times daily as needed. 3350 g 1  . quinapril (ACCUPRIL) 40 MG tablet Take 40 mg by mouth at bedtime.      No current facility-administered medications for this visit.     Functional Status:  In your present state of health, do you have any difficulty performing the following activities: 03/23/2018 03/23/2018  Hearing? [name]  Vision? N N  Difficulty concentrating or making decisions?  N -  Walking or climbing stairs? N -  Dressing or bathing? N -  Doing errands, shopping? N -  Preparing Food and eating ? N -  Using the Toilet? N -  In the past six months, have you accidently leaked urine? N -  Do you have problems with loss of bowel control? N -  Managing your Medications? N -  Managing your Finances? N -  Housekeeping or managing your Housekeeping? N -  Some recent data might be hidden    Fall/Depression Screening: Fall Risk  04/18/2018 04/11/2018 03/23/2018  Falls in the past year? No No No  Number falls in past yr: - - -  Injury with Fall? - - -  Comment - - -  Risk Factor Category  - - -  Risk for fall due to : - - -  Risk for fall due to: Comment - - -  Follow up - - -  Comment - - -   PHQ 2/9 Scores 04/11/2018 03/23/2018 01/31/2018 01/10/2018 12/22/2017 10/20/2017  10/12/2017  PHQ - 2 Score 0 0 0 0 0 0 0    Assessment: Patient will continue to benefit from health coach outreach for disease management and support.  THN CM Care Plan Problem One     Most Recent Value  [facility]N Long Term Goal   in 90 days the patient will lower his a1c of 8.6 by 1-2 points  [facility] Long Term Goal Start Date  04/18/18  Interventions for Problem One Long Term Goal     Reviewed cbg record,  discussed dietary intake,  reviewed and discussed medication management     Plan: [place] will contact patient in the month of November and patient agrees to next outreach.  [name] RN, BSN, [place] Direct Dial:  [phone]  Fax: [phone]

## 2018-04-22 ENCOUNTER — Ambulatory Visit: Payer: Self-pay

## 2018-04-22 DIAGNOSIS — E1142 Type 2 diabetes mellitus with diabetic polyneuropathy: Secondary | ICD-10-CM | POA: Diagnosis not present

## 2018-04-22 DIAGNOSIS — Z95810 Presence of automatic (implantable) cardiac defibrillator: Secondary | ICD-10-CM | POA: Diagnosis not present

## 2018-05-05 DIAGNOSIS — I70222 Atherosclerosis of native arteries of extremities with rest pain, left leg: Secondary | ICD-10-CM | POA: Diagnosis not present

## 2018-05-10 DIAGNOSIS — H40013 Open angle with borderline findings, low risk, bilateral: Secondary | ICD-10-CM | POA: Diagnosis not present

## 2018-05-11 DIAGNOSIS — I739 Peripheral vascular disease, unspecified: Secondary | ICD-10-CM | POA: Diagnosis not present

## 2018-05-11 DIAGNOSIS — M79672 Pain in left foot: Secondary | ICD-10-CM | POA: Diagnosis not present

## 2018-05-11 DIAGNOSIS — I771 Stricture of artery: Secondary | ICD-10-CM | POA: Diagnosis not present

## 2018-05-11 DIAGNOSIS — E119 Type 2 diabetes mellitus without complications: Secondary | ICD-10-CM | POA: Diagnosis not present

## 2018-05-13 ENCOUNTER — Ambulatory Visit (INDEPENDENT_AMBULATORY_CARE_PROVIDER_SITE_OTHER): Payer: Medicare Other | Admitting: Family

## 2018-05-13 ENCOUNTER — Encounter: Payer: Self-pay | Admitting: Family

## 2018-05-13 VITALS — BP 107/63 | HR 78 | Temp 97.4°F | Ht 64.0 in | Wt 234.0 lb

## 2018-05-13 DIAGNOSIS — B9689 Other specified bacterial agents as the cause of diseases classified elsewhere: Secondary | ICD-10-CM | POA: Diagnosis not present

## 2018-05-13 DIAGNOSIS — J208 Acute bronchitis due to other specified organisms: Secondary | ICD-10-CM | POA: Diagnosis not present

## 2018-05-13 MED ORDER — AZITHROMYCIN 250 MG PO TABS
ORAL_TABLET | ORAL | 0 refills | Status: DC
Start: 2018-05-13 — End: 2018-07-19

## 2018-05-13 MED ORDER — BENZONATATE 200 MG PO CAPS: 200.0000 mg | ORAL_CAPSULE | Freq: Three times a day (TID) | ORAL | 1 refills | Status: DC | PRN

## 2018-05-13 NOTE — Patient Instructions (Signed)

## 2018-05-13 NOTE — Progress Notes (Signed)
   Subjective:    Patient ID: Manuel Kline, male    DOB: Dec 07, 1931, 84 y.o.   MRN: [id number]  Chief Complaint  Patient presents with  . cough and congestion    had for five days    Cough  This is a new problem. The current episode started 1 to 4 weeks ago. The problem has been gradually worsening. The problem occurs every few minutes. The cough is productive of sputum. Associated symptoms include chills, ear pain, headaches, myalgias, nasal congestion, postnasal drip and shortness of breath. Pertinent negatives include no ear congestion, fever, rhinorrhea or wheezing. The symptoms are aggravated by lying down. The treatment provided mild relief. There is no history of COPD.      Review of Systems  Constitutional: Positive for chills. Negative for fever.  HENT: Positive for ear pain and postnasal drip. Negative for rhinorrhea.   Respiratory: Positive for cough and shortness of breath. Negative for wheezing.   Musculoskeletal: Positive for myalgias.  Neurological: Positive for headaches.  All other systems reviewed and are negative.      Objective:   Physical Exam  Constitutional: He is oriented to person, place, and time. He appears well-developed and well-nourished. No distress.  HENT:  Head: Normocephalic.  Right Ear: External ear normal.  Left Ear: External ear normal.  Mouth/Throat: Posterior oropharyngeal erythema present.  Eyes: Pupils are equal, round, and reactive to light. Right eye exhibits no discharge. Left eye exhibits no discharge.  Neck: Normal range of motion. Neck supple. No thyromegaly present.  Cardiovascular: Normal rate, regular rhythm, normal heart sounds and intact distal pulses.  No murmur heard. Pulmonary/Chest: Effort normal. No respiratory distress. He has decreased breath sounds. He has no wheezes. He has rales.  Abdominal: Soft. Bowel sounds are normal. He exhibits no distension. There is no tenderness.  Musculoskeletal: Normal range of motion. He  exhibits no edema or tenderness.  Neurological: He is alert and oriented to person, place, and time. He has normal reflexes. No cranial nerve deficit.  Skin: Skin is warm and dry. No rash noted. No erythema.  Psychiatric: He has a normal mood and affect. His behavior is normal. Judgment and thought content normal.  Vitals reviewed.     BP 107/63   Pulse 78   Temp (!) 97.4 F (36.3 C) (Oral)   Ht 5\' 4"  (1.626 m)   Wt 234 lb (106.1 kg)   BMI 40.17 kg/m      Assessment & Plan:  Manuel Kline comes in today with chief complaint of cough and congestion (had for five days)   Diagnosis and orders addressed:  1. Acute bacterial bronchitis - Take meds as prescribed - Use a cool mist humidifier  -Use saline nose sprays frequently -Force fluids -For any cough or congestion  Use plain Mucinex- regular strength or max strength is fine -For fever or aces or pains- take tylenol or ibuprofen. -Throat lozenges if help -RTO if symptoms worsen or not improve  - azithromycin (ZITHROMAX) 250 MG tablet; Take 500 mg once, then 250 mg for four days  Dispense: 6 tablet; Refill: 0 - benzonatate (TESSALON) 200 MG capsule; Take 1 capsule (200 mg total) by mouth 3 (three) times daily as needed.  Dispense: 30 capsule; Refill: [place], FNP

## 2018-05-25 DIAGNOSIS — H35041 Retinal micro-aneurysms, unspecified, right eye: Secondary | ICD-10-CM | POA: Diagnosis not present

## 2018-05-25 DIAGNOSIS — E113393 Type 2 diabetes mellitus with moderate nonproliferative diabetic retinopathy without macular edema, bilateral: Secondary | ICD-10-CM | POA: Diagnosis not present

## 2018-05-25 DIAGNOSIS — H26491 Other secondary cataract, right eye: Secondary | ICD-10-CM | POA: Diagnosis not present

## 2018-05-25 DIAGNOSIS — H3561 Retinal hemorrhage, right eye: Secondary | ICD-10-CM | POA: Diagnosis not present

## 2018-05-25 LAB — HM DIABETES EYE EXAM

## 2018-05-26 ENCOUNTER — Encounter: Payer: Self-pay | Admitting: Registered"

## 2018-05-26 ENCOUNTER — Encounter: Payer: Medicare Other | Attending: Pediatrics | Admitting: Registered"

## 2018-05-26 DIAGNOSIS — E1140 Type 2 diabetes mellitus with diabetic neuropathy, unspecified: Secondary | ICD-10-CM | POA: Diagnosis not present

## 2018-05-26 DIAGNOSIS — E1165 Type 2 diabetes mellitus with hyperglycemia: Secondary | ICD-10-CM | POA: Insufficient documentation

## 2018-05-26 DIAGNOSIS — IMO0002 Reserved for concepts with insufficient information to code with codable children: Secondary | ICD-10-CM

## 2018-05-26 NOTE — Progress Notes (Signed)
Diabetes Self-Management Education  Visit Type: First/Initial  Appt. Start Time: 0930 Appt. End Time: 1030  05/26/2018  [name], identified by name and date of birth, is a 84 y.o. male with a diagnosis of Diabetes: Type 2.   This patient is accompanied in the office by his spouse.  ASSESSMENT Per MD chart notes A1c is 8.6% up from 7.1%. Pt states he tries to take insulin as prescribed; NPH (Humulin N) BID, Humulin R 2x before meal. Pt states when he takes as prescribed his FBG is around 120, if he forgets it is above 300.   Pt states he used to take insulin with afternoon meal but stopped due to expense. Pt states he uses [facility] ([facility]) for one of his insulin Rx. Pt states recently he is getting calls from a nurse through [facility] and they have connected him to the [place] help for insulin.  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 Pt states his son is very helpful with house repairs and comes over every morning M-F and cooks breakfast for them and gives him a hard time about using the salt shaker.  Pt states he forgot to take insulin last night because they were in a hurry to get to church (Wed & Sun evenings at 6:30 pm). After they got home ~7:30 he sat down to dinner, his routine is to watch TV while eating dinner. Pt states he doesn't forget to take his other medications that he keeps in a bill box. Pt states he keeps his open insulin in the refrigerator.  Diabetes Self-Management Education - 05/26/18 1038      Visit Information   Visit Type  First/Initial      Initial Visit   Diabetes Type  Type 2    Are you currently following a meal plan?  No    Are you taking your medications as prescribed?  No   forgets to take insulin   Date Diagnosed  [place]   How would you rate your overall health?  Fair      Psychosocial Assessment   Other persons present  Spouse/SO    How  often do you need to have someone help you when you read instructions, pamphlets, or other written materials from your doctor or pharmacy?  5 - Always      Complications   Last HgB A1C per patient/outside source  8.6 %    How often do you check your blood sugar?  1-2 times/day    Fasting Blood glucose range (mg/dL)  70-129;>200   in target when he takes insulin, over 300 when he forgets   Have you had a dilated eye exam in the past 12 months?  Yes    Have you had a dental exam in the past 12 months?  No    Are you checking your feet?  Yes    How many days per week are you checking your feet?  7      Dietary Intake   Breakfast  1-2 eggs, bacon, sausage, oatmeal, blue berries    Snack (morning)  nabs, cookies, chips, fruit    Lunch  hot dog OR banana sandwich    Snack (afternoon)  same as above    Biomedical scientist, pinto beans, green peas    Beverage(s)  1 c black coffee am, water, sugar-free lemonade  Exercise   Exercise Type  ADL's    How many days per week to you exercise?  0    How many minutes per day do you exercise?  0    Total minutes per week of exercise  0      Patient Education   Previous Diabetes Education  No    Nutrition management   Role of diet in the treatment of diabetes and the relationship between the three main macronutrients and blood glucose level    Medications  Reviewed patients medication for diabetes, action, purpose, timing of dose and side effects.    Monitoring  Identified appropriate SMBG and/or A1C goals.      Individualized Goals (developed by patient)   Nutrition  General guidelines for healthy choices and portions discussed    Physical Activity  Exercise 3-5 times per week    Medications  take my medication as prescribed    Problem Solving  make reminder notes to take insulin      Outcomes   Expected Outcomes  Demonstrated interest in learning. Expect positive outcomes    Future DMSE  PRN    Program Status  Completed      Individualized  Plan for Diabetes Self-Management Training:   Learning Objective:  Patient will have a greater understanding of diabetes self-management. Patient education plan is to attend individual and/or group sessions per assessed needs and concerns.   Patient Instructions  Your insulin seems to working well when you remember to take it. Put reminders to help you remember to take your insulin - one on your TV remote control and another reminder on your pill boxes. Talk to your doctor about doing a lunch-time insulin dose. Talk to pharmacist about how long you can leave insulin out of refrigerator once you open it. When you eat fruit or other food that turn into sugar be sure to also eat protein. Consider having peanut butter with you banana sandwich. Consider doing some movement everyday, can use the Arm Chair activities handout for some ideas.   Expected Outcomes:  Demonstrated interest in learning. Expect positive outcomes  Education material provided: A1C conversion sheet and My Plate , Arm Chair exercises.  If problems or questions, patient to contact team via:  Phone  Future DSME appointment: PRN

## 2018-05-26 NOTE — Patient Instructions (Addendum)
Your insulin seems to working well when you remember to take it. Put reminders to help you remember to take your insulin - one on your TV remote control and another reminder on your pill boxes. Talk to your doctor about doing a lunch-time insulin dose. Talk to pharmacist about how long you can leave insulin out of refrigerator once you open it. When you eat fruit or other food that turn into sugar be sure to also eat protein. Consider having peanut butter with you banana sandwich. Consider doing some movement everyday, can use the Arm Chair activities handout for some ideas.

## 2018-05-30 ENCOUNTER — Other Ambulatory Visit: Payer: Self-pay | Admitting: Pediatrics

## 2018-06-14 DIAGNOSIS — G4733 Obstructive sleep apnea (adult) (pediatric): Secondary | ICD-10-CM | POA: Diagnosis not present

## 2018-07-13 ENCOUNTER — Ambulatory Visit (INDEPENDENT_AMBULATORY_CARE_PROVIDER_SITE_OTHER): Payer: Medicare Other | Admitting: Vascular Surgery

## 2018-07-13 ENCOUNTER — Encounter: Payer: Self-pay | Admitting: Vascular Surgery

## 2018-07-13 VITALS — BP 153/90 | HR 82 | Temp 97.6°F | Resp 18 | Ht 66.0 in | Wt 238.0 lb

## 2018-07-13 DIAGNOSIS — I739 Peripheral vascular disease, unspecified: Secondary | ICD-10-CM

## 2018-07-13 NOTE — Progress Notes (Signed)
REASON FOR CONSULT:    Peripheral vascular disease.  The consult is requested by Dr. Irving Shows.  HPI:   Manuel Kline is a pleasant 84 y.o. male, who is referred for evaluation of peripheral vascular disease.  I reviewed the records from the referring office.  Patient was seen on 05/05/2018 with pain in the left great toe.  The toenail had been removed on 2 occasions.  At that time the patient was complaining of intermittent claudication and for this reason vascular surgery consultation was obtained.  On my history, the patient complains of pain in his left great toe which is been going on for some time.  He said that nail removed several times.  He did have noninvasive studies done in September which showed only mild infrainguinal arterial occlusive disease on the right with no significant disease on the left.  He does describe pain in his hips which is associated with standing.  I do not get any clear-cut history of calf thigh or hip claudication.  He denies any history of rest pain.  He denies any history of nonhealing wounds.  He does have a history of neuropathy secondary to his diabetes. He also has a history of some mild renal insufficiency.  He does state that his legs sometimes give out on him.  He has had 3 previous back operations and I suspect this is related to his back.  He has undergone previous coronary revascularization and vein was taken from the right leg.  Past Medical History:  Diagnosis Date  . Adenomatous colon polyp 2006  . CAD (coronary artery disease)   . Calcium oxalate renal stones   . Cardiomyopathy   . Cataract   . Diabetes ([place])   . Erectile dysfunction   . Erosive esophagitis   . Hemorrhoids   . HLD (hyperlipidemia)   . HTN (hypertension)   . Hypertensive heart disease without CHF 07/31/2011  . ICD (implantable cardiac defibrillator) in place   . ICD dual chamber in situ   . Metabolic syndrome   . Morbid obesity ([place])   . Osteoarthritis   . S/P CABG  (coronary artery bypass graft) 11/02/2000  . Sleep apnea     Family History  Problem Relation Age of Onset  . Diabetes Brother   . Heart disease Father   . Heart disease Mother   . Diabetes Mother   . Diabetes Brother   . Diabetes Brother   . Cancer Brother        baldder  . Heart disease Brother   . Diabetes Sister   . Diabetes Sister   . Kidney disease Sister        dialysis  . Diabetes Sister   . Diabetes Sister   . Diabetes Sister   . Throat cancer Paternal Uncle     SOCIAL HISTORY: He is not a smoker. Social History   Socioeconomic History  . Marital status: Married    Spouse name: Not on file  . Number of children: 4  . Years of education: 64  . Highest education level: 11th grade  Occupational History  . Occupation: Retired from [employer]: RETIRED  Social Needs  . Financial resource strain: Not hard at all  . Food insecurity:    Worry: Never true    Inability: Never true  . Transportation needs:    Medical: No    Non-medical: No  Tobacco Use  . Smoking status: Never Smoker  . Smokeless tobacco:  Never Used  Substance and Sexual Activity  . Alcohol use: No    Alcohol/week: 0.0 standard drinks  . Drug use: No  . Sexual activity: Yes  Lifestyle  . Physical activity:    Days per week: 0 days    Minutes per session: 0 min  . Stress: Not at all  Relationships  . Social connections:    Talks on phone: More than three times a week    Gets together: More than three times a week    Attends religious service: More than 4 times per year    Active member of club or organization: Yes    Attends meetings of clubs or organizations: More than 4 times per year    Relationship status: Married  . Intimate partner violence:    Fear of current or ex partner: No    Emotionally abused: No    Physically abused: No    Forced sexual activity: No  Other Topics Concern  . Not on file  Social History Narrative   Right handed, Married, 4 kids from  previous marriage.  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 Caffeine 1 cup daily.    No Known Allergies  Current Outpatient Medications  Medication Sig Dispense Refill  . allopurinol (ZYLOPRIM) 300 MG tablet TAKE 1/2 (ONE-HALF) TABLET BY MOUTH ONCE DAILY 45 tablet 1  . aspirin 81 MG tablet Take 81 mg by mouth daily.     Marland Kitchen atorvastatin (LIPITOR) 80 MG tablet Take 0.5 tablets (40 mg total) by mouth daily. 90 tablet 3  . azithromycin (ZITHROMAX) 250 MG tablet Take 500 mg once, then 250 mg for four days 6 tablet 0  . benzonatate (TESSALON) 200 MG capsule Take 1 capsule (200 mg total) by mouth 3 (three) times daily as needed. 30 capsule 1  . cloNIDine (CATAPRES) 0.1 MG tablet Take 1 tablet by mouth 2 (two) times daily.    . clotrimazole (LOTRIMIN) 1 % cream Apply topically 2 (two) times daily. 45 g 0  . Cyanocobalamin (VITAMIN B 12 PO) Take 1,000 mcg by mouth daily.    Marland Kitchen docusate sodium (STOOL SOFTENER) 100 MG capsule Take 100 mg by mouth daily.    . furosemide (LASIX) 40 MG tablet Take 1 tablet by mouth 2 (two) times daily.    Marland Kitchen gabapentin (NEURONTIN) 300 MG capsule Take 1 capsule (300 mg total) by mouth 2 (two) times daily. 180 capsule 1  . glucose blood test strip Use to check blood glucose 4 times daily 400 each 0  . insulin NPH Human (HUMULIN N) 100 UNIT/ML injection INJECT 35 UNITS BID 30 mL 3  . insulin regular (HUMULIN R) 100 units/mL injection Inject 0.3 mLs (30 Units total) into the skin 2 (two) times daily before a meal. 60 mL 3  . Insulin Syringe-Needle U-100 30G X 3/16" 1 ML MISC 6 each by Does not apply route 6 (six) times daily. 200 each 5  . metoprolol succinate (TOPROL-XL) 50 MG 24 hr tablet Take 50 mg by mouth daily.    . Multiple Vitamin (MULTIVITAMIN) tablet Take 1 tablet by mouth daily.     . nitroGLYCERIN (NITROSTAT) 0.4 MG SL tablet Place 0.4 mg under the tongue every 5 (five) minutes as needed for chest pain.    . Omega-3 Fatty Acids (FISH OIL) 1000 MG CPDR Take 1 tablet by mouth daily.    .  polyethylene glycol powder (GLYCOLAX/MIRALAX) powder Take 17 g by mouth 2 (two) times daily as needed. 3350 g 1  .  quinapril (ACCUPRIL) 40 MG tablet Take 40 mg by mouth at bedtime.      No current facility-administered medications for this visit.     REVIEW OF SYSTEMS:  [X]  denotes positive finding, [ ]  denotes negative finding Cardiac  Comments:  Chest pain or chest pressure:    Shortness of breath upon exertion:    Short of breath when lying flat:    Irregular heart rhythm:        Vascular    Pain in calf, thigh, or hip brought on by ambulation: x   Pain in feet at night that wakes you up from your sleep:  x   Blood clot in your veins:    Leg swelling:         Pulmonary    Oxygen at home:    Productive cough:     Wheezing:         Neurologic    Sudden weakness in arms or legs:     Sudden numbness in arms or legs:     Sudden onset of difficulty speaking or slurred speech:    Temporary loss of vision in one eye:     Problems with dizziness:         Gastrointestinal    Blood in stool:     Vomited blood:         Genitourinary    Burning when urinating:     Blood in urine:        Psychiatric    Major depression:         Hematologic    Bleeding problems:    Problems with blood clotting too easily:        Skin    Rashes or ulcers:        Constitutional    Fever or chills:     PHYSICAL EXAM:   Vitals:   07/13/18 1244  BP: (!) 153/90  Pulse: 82  Resp: 18  Temp: 97.6 F (36.4 C)  SpO2: 98%  Weight: 238 lb (108 kg)  Height: 5\' 6"  (1.676 m)    GENERAL: The patient is a well-nourished male, in no acute distress. The vital signs are documented above. CARDIAC: There is a regular rate and rhythm.  VASCULAR: I do not detect carotid bruits. On the left side, which is the symptomatic side, he has a palpable femoral, popliteal, and dorsalis pedis pulse. On the right side he has a palpable femoral pulse and diminished but palpable dorsalis pedis pulse. He has mild  bilateral lower extremity swelling which is more significant on the right side. PULMONARY: There is good air exchange bilaterally without wheezing or rales. ABDOMEN: Soft and non-tender with normal pitched bowel sounds.  I am unable to assess for aneurysm given his size. MUSCULOSKELETAL: There are no major deformities or cyanosis. NEUROLOGIC: No focal weakness or paresthesias are detected. SKIN: There are no ulcers or rashes noted. PSYCHIATRIC: The patient has a normal affect.  DATA:    ARTERIAL DOPPLER STUDY: I did review the arterial Doppler study that was done by radiology on 05/11/2018.  This showed an ABI of 89% on the right and 100% on the left.  CAROTID DUPLEX: I did review the carotid duplex scan that was done on 03/15/2018.  There was a less than 39% carotid stenosis bilaterally.  Both vertebral arteries were patent with antegrade flow.  ASSESSMENT & PLAN:   PERIPHERAL VASCULAR DISEASE: Patient has evidence of mild infrainguinal arterial occlusive disease on the right with an  ABI of 89%.  He has normal pressures on the left with an ABI of 100%.  I think the pain in his toe is more likely related to neuropathy.  I reassured him that he has excellent arterial flow.  The symptoms he describes with his legs giving out on him more more likely related to his back.  I will be happy to see him back at any time if any new vascular issues arise.  Currently I would not recommend any further work-up for his mild right lower extremity infrainguinal arterial occlusive disease.   Deitra Mayo [facility] [phone]

## 2018-07-18 ENCOUNTER — Encounter: Payer: Self-pay | Admitting: Vascular Surgery

## 2018-07-19 ENCOUNTER — Encounter: Payer: Self-pay | Admitting: Pediatrics

## 2018-07-19 ENCOUNTER — Ambulatory Visit (INDEPENDENT_AMBULATORY_CARE_PROVIDER_SITE_OTHER): Payer: Medicare Other | Admitting: Pediatrics

## 2018-07-19 VITALS — BP 122/68 | HR 74 | Temp 96.8°F | Ht 66.0 in | Wt 237.4 lb

## 2018-07-19 DIAGNOSIS — E1140 Type 2 diabetes mellitus with diabetic neuropathy, unspecified: Secondary | ICD-10-CM

## 2018-07-19 DIAGNOSIS — I10 Essential (primary) hypertension: Secondary | ICD-10-CM

## 2018-07-19 DIAGNOSIS — E785 Hyperlipidemia, unspecified: Secondary | ICD-10-CM | POA: Diagnosis not present

## 2018-07-19 DIAGNOSIS — IMO0002 Reserved for concepts with insufficient information to code with codable children: Secondary | ICD-10-CM

## 2018-07-19 DIAGNOSIS — E1165 Type 2 diabetes mellitus with hyperglycemia: Secondary | ICD-10-CM | POA: Diagnosis not present

## 2018-07-19 DIAGNOSIS — N184 Chronic kidney disease, stage 4 (severe): Secondary | ICD-10-CM | POA: Diagnosis not present

## 2018-07-19 DIAGNOSIS — Z23 Encounter for immunization: Secondary | ICD-10-CM | POA: Diagnosis not present

## 2018-07-19 DIAGNOSIS — G629 Polyneuropathy, unspecified: Secondary | ICD-10-CM

## 2018-07-19 DIAGNOSIS — Z8739 Personal history of other diseases of the musculoskeletal system and connective tissue: Secondary | ICD-10-CM

## 2018-07-19 LAB — CMP14+EGFR
ALK PHOS: 110 IU/L (ref 39–117)
ALT: 27 IU/L (ref 0–44)
AST: 29 IU/L (ref 0–40)
Albumin/Globulin Ratio: 2 (ref 1.2–2.2)
Albumin: 4.4 g/dL (ref 3.5–4.7)
BUN/Creatinine Ratio: 14 (ref 10–24)
BUN: 31 mg/dL — ABNORMAL HIGH (ref 8–27)
Bilirubin Total: 0.6 mg/dL (ref 0.0–1.2)
CO2: 23 mmol/L (ref 20–29)
CREATININE: 2.16 mg/dL — AB (ref 0.76–1.27)
Calcium: 9.4 mg/dL (ref 8.6–10.2)
Chloride: 99 mmol/L (ref 96–106)
GFR calc Af Amer: 31 mL/min/{1.73_m2} — ABNORMAL LOW (ref >59)
GFR calc non Af Amer: 27 mL/min/{1.73_m2} — ABNORMAL LOW (ref >59)
GLOBULIN, TOTAL: 2.2 g/dL (ref 1.5–4.5)
Glucose: 125 mg/dL — ABNORMAL HIGH (ref 65–99)
POTASSIUM: 4.6 mmol/L (ref 3.5–5.2)
SODIUM: 140 mmol/L (ref 134–144)
Total Protein: 6.6 g/dL (ref 6.0–8.5)

## 2018-07-19 LAB — BAYER DCA HB A1C WAIVED: HB A1C (BAYER DCA - WAIVED): 8 % — ABNORMAL HIGH (ref <7.0)

## 2018-07-19 LAB — LIPID PANEL
CHOLESTEROL TOTAL: 121 mg/dL (ref 100–199)
Chol/HDL Ratio: 3.5 ratio (ref 0.0–5.0)
HDL: 35 mg/dL — AB (ref >39)
LDL CALC: 45 mg/dL (ref 0–99)
TRIGLYCERIDES: 203 mg/dL — AB (ref 0–149)
VLDL Cholesterol Cal: 41 mg/dL — ABNORMAL HIGH (ref 5–40)

## 2018-07-19 MED ORDER — "INSULIN SYRINGE-NEEDLE U-100 30G X 3/16"" 1 ML MISC": 6.0000 | Freq: Every day | 5 refills | Status: DC

## 2018-07-19 MED ORDER — INSULIN REGULAR HUMAN 100 UNIT/ML IJ SOLN: 30.0000 [IU] | Freq: Two times a day (BID) | INTRAMUSCULAR | 3 refills | Status: DC

## 2018-07-19 MED ORDER — GABAPENTIN 300 MG PO CAPS: 300.0000 mg | ORAL_CAPSULE | Freq: Two times a day (BID) | ORAL | 1 refills | Status: DC

## 2018-07-19 MED ORDER — ATORVASTATIN CALCIUM 80 MG PO TABS: 40.0000 mg | ORAL_TABLET | Freq: Every day | ORAL | 3 refills | Status: DC

## 2018-07-19 MED ORDER — ALLOPURINOL 300 MG PO TABS: ORAL_TABLET | ORAL | 1 refills | Status: DC

## 2018-07-19 MED ORDER — INSULIN NPH (HUMAN) (ISOPHANE) 100 UNIT/ML ~~LOC~~ SUSP: SUBCUTANEOUS | 3 refills | Status: DC

## 2018-07-19 NOTE — Progress Notes (Signed)
Subjective:   Patient ID: Manuel Kline, male    DOB: 02/18/1932, 84 y.o.   MRN: [id number] CC: Medical Management of Chronic Issues  HPI: Manuel Kline is a 84 y.o. male   Diabetes: Taking insulin fairly regularly.  Does not have any blood sugars with him today.  Does check regularly at home.  Eating sugary foods fairly regularly.  CKD: Following with nephrology  Hypertension: Taking medicines.  No lightheadedness, chest pain with exertion.  Remains fairly active with his garden  Neuropathy: Taking gabapentin regularly.  Still bothered some of symptoms in left great toe.  Sometimes uses topical medicine, sometimes helps.  History of gout: No recent flares.  Relevant past medical, surgical, family and social history reviewed. Allergies and medications reviewed and updated. Social History   Tobacco Use  Smoking Status Never Smoker  Smokeless Tobacco Never Used   ROS: Per HPI   Objective:    BP 122/68   Pulse 74   Temp (!) 96.8 F (36 C) (Oral)   Ht _0  (1.676 m)   Wt 237 lb 6.4 oz (107.7 kg)   BMI 38.32 kg/m   Wt Readings from Last 3 Encounters:  07/19/18 237 lb 6.4 oz (107.7 kg)  07/13/18 238 lb (108 kg)  05/13/18 234 lb (106.1 kg)    Gen: NAD, alert, cooperative with exam, NCAT EYES: EOMI, no conjunctival injection, or no icterus ENT:  TMs pearly gray b/l, OP without erythema LYMPH: no cervical LAD CV: NRRR, normal S1/S2, no murmur, distal pulses 2+ b/l Resp: CTABL, no wheezes, normal WOB Abd: +BS, soft, NTND. no guarding or organomegaly Ext: No edema, warm Neuro: Alert and oriented MSK: normal muscle bulk  Assessment & Plan:  Merrit was seen today for medical management of chronic issues.  Diagnoses and all orders for this visit:  Type 2 diabetes, uncontrolled, with neuropathy (HCC) A1c 8.0.  Improved from 8.6.  Decrease sugary food intake.  Check blood sugars at home before each meal and bring blood sugars into next -     CMP14+EGFR -     Lipid panel -      Bayer DCA Hb A1c Waived -     Insulin Syringe-Needle U-100 30G X 3/16" 1 ML MISC; 6 each by Does not apply route 6 (six) times daily. -     insulin regular (HUMULIN R) 100 units/mL injection; Inject 0.3 mLs (30 Units total) into the skin 2 (two) times daily before a meal. -     insulin NPH Human (HUMULIN N) 100 UNIT/ML injection; INJECT 35 UNITS BID  CKD (chronic kidney disease) stage 4, GFR 15-29 ml/min (HCC) Following with nephrology. -     CMP14+EGFR -     Lipid panel -     Bayer DCA Hb A1c Waived  Essential hypertension Stable, continue current medicines -     CMP14+EGFR -     Lipid panel -     Bayer DCA Hb A1c Waived  Hyperlipidemia, unspecified hyperlipidemia type Stable, continue atorvastatin -     CMP14+EGFR -     Lipid panel -     Bayer DCA Hb A1c Waived -     atorvastatin (LIPITOR) 80 MG tablet; Take 0.5 tablets (40 mg total) by mouth daily.  Neuropathy Stable, continue below.  Okay to use topical medicines such as capsaicin cream. -     gabapentin (NEURONTIN) 300 MG capsule; Take 1 capsule (300 mg total) by mouth 2 (two) times daily.  H/O: gout Stable,  continue below.  No recent flares -     allopurinol (ZYLOPRIM) 300 MG tablet; TAKE 1/2 (ONE-HALF) TABLET BY MOUTH ONCE DAILY   Follow up plan: Return in about 3 months (around 10/19/2018). Assunta Found, MD [place]

## 2018-07-19 NOTE — Patient Instructions (Signed)
Check blood sugars before each meal. Bring blood sugars in to clinic next visit.  Let me know if you have any blood sugars <100 or any blood sugars >275  Set alarms at home to help remind you to take insulin

## 2018-07-21 ENCOUNTER — Other Ambulatory Visit: Payer: Self-pay | Admitting: *Deleted

## 2018-07-21 ENCOUNTER — Other Ambulatory Visit: Payer: Self-pay

## 2018-07-21 NOTE — Patient Outreach (Signed)
[facility]) Care Management  07/21/2018   Manuel Kline 11-Apr-1932 [id number]  Subjective: Successful telephone outreach to the patient for assessment. HIPAA verified.  The patient states that he is doing ok.  He denies any falls.  The patient checked his blood sugar this morning and it was 180.  I congratulated the patient because his a1c went form a 8.6 in August to a 8.0 on 11/19/20119.  He states that he is monitoring his diet.  He is adherent with his medications.  The patient states that he has chronic pain in his big toe on the left foot.  He states that the pain rates a 8/10.  He has seen his physician regarding the problem and vascular department.The patient states that he has an appointment for a hering test on 08/03/2018 to receive hearing aids.  He has an appointment with his Nephrologist on 08/18/2018.     Current Medications:  Current Outpatient Medications  Medication Sig Dispense Refill  . allopurinol (ZYLOPRIM) 300 MG tablet TAKE 1/2 (ONE-HALF) TABLET BY MOUTH ONCE DAILY 45 tablet 1  . aspirin 81 MG tablet Take 81 mg by mouth daily.     Marland Kitchen atorvastatin (LIPITOR) 80 MG tablet Take 0.5 tablets (40 mg total) by mouth daily. 90 tablet 3  . cloNIDine (CATAPRES) 0.1 MG tablet Take 1 tablet by mouth 2 (two) times daily.    . clotrimazole (LOTRIMIN) 1 % cream Apply topically 2 (two) times daily. 45 g 0  . Cyanocobalamin (VITAMIN B 12 PO) Take 1,000 mcg by mouth daily.    . furosemide (LASIX) 40 MG tablet Take 1 tablet by mouth 2 (two) times daily.    Marland Kitchen gabapentin (NEURONTIN) 300 MG capsule Take 1 capsule (300 mg total) by mouth 2 (two) times daily. 180 capsule 1  . glucose blood test strip Use to check blood glucose 4 times daily 400 each 0  . insulin NPH Human (HUMULIN N) 100 UNIT/ML injection INJECT 35 UNITS BID 30 mL 3  . insulin regular (HUMULIN R) 100 units/mL injection Inject 0.3 mLs (30 Units total) into the skin 2 (two) times daily before a meal. 60 mL 3  .  Insulin Syringe-Needle U-100 30G X 3/16" 1 ML MISC 6 each by Does not apply route 6 (six) times daily. 200 each 5  . metoprolol succinate (TOPROL-XL) 50 MG 24 hr tablet Take 50 mg by mouth daily.    . Multiple Vitamin (MULTIVITAMIN) tablet Take 1 tablet by mouth daily.     . nitroGLYCERIN (NITROSTAT) 0.4 MG SL tablet Place 0.4 mg under the tongue every 5 (five) minutes as needed for chest pain.    . Omega-3 Fatty Acids (FISH OIL) 1000 MG CPDR Take 1 tablet by mouth daily.    . polyethylene glycol powder (GLYCOLAX/MIRALAX) powder Take 17 g by mouth 2 (two) times daily as needed. 3350 g 1  . quinapril (ACCUPRIL) 40 MG tablet Take 40 mg by mouth at bedtime.     . docusate sodium (STOOL SOFTENER) 100 MG capsule Take 100 mg by mouth daily.     No current facility-administered medications for this visit.     Functional Status:  In your present state of health, do you have any difficulty performing the following activities: 03/23/2018 03/23/2018  Hearing? [name]  Vision? N N  Difficulty concentrating or making decisions? N -  Walking or climbing stairs? N -  Dressing or bathing? N -  Doing errands, shopping? N -  Conservation officer, nature  and eating ? N -  Using the Toilet? N -  In the past six months, have you accidently leaked urine? N -  Do you have problems with loss of bowel control? N -  Managing your Medications? N -  Managing your Finances? N -  Housekeeping or managing your Housekeeping? N -  Some recent data might be hidden    Fall/Depression Screening: Fall Risk  07/21/2018 07/19/2018 05/26/2018  Falls in the past year? 0 0 No  Number falls in past yr: - 0 -  Injury with Fall? - 0 -  Comment - - -  Risk Factor Category  - - -  Risk for fall due to : - - -  Risk for fall due to: Comment - - -  Follow up - - -  Comment - - -   PHQ 2/9 Scores 07/19/2018 05/26/2018 05/13/2018 04/11/2018 03/23/2018 01/31/2018 01/10/2018  PHQ - 2 Score 0 0 0 0 0 0 0    Assessment: Patient will continue to benefit  from health coach outreach for disease management and support.  THN CM Care Plan Problem One     Most Recent Value  THN Long Term Goal   in 30 days the patient will lower his a1c of 8.0 by 1-2 points  [facility] Long Term Goal Start Date  07/21/18  Interventions for Problem One Long Term Goal     Discussed cbg record,  discussed food and fluid  intake, and discussed medication management     Plan:  [facility] will contact patient in the month of December and patient agrees to next outreach.    [name] RN, BSN, [place] Direct Dial:  [phone]  Fax: [phone]

## 2018-07-22 ENCOUNTER — Ambulatory Visit: Payer: Self-pay

## 2018-07-25 ENCOUNTER — Ambulatory Visit (INDEPENDENT_AMBULATORY_CARE_PROVIDER_SITE_OTHER): Payer: Medicare Other

## 2018-07-25 DIAGNOSIS — I5022 Chronic systolic (congestive) heart failure: Secondary | ICD-10-CM

## 2018-07-25 DIAGNOSIS — I255 Ischemic cardiomyopathy: Secondary | ICD-10-CM

## 2018-07-25 NOTE — Progress Notes (Signed)
Remote ICD transmission.   

## 2018-07-29 ENCOUNTER — Other Ambulatory Visit: Payer: Self-pay | Admitting: Pediatrics

## 2018-07-29 DIAGNOSIS — IMO0002 Reserved for concepts with insufficient information to code with codable children: Secondary | ICD-10-CM

## 2018-07-29 DIAGNOSIS — E1165 Type 2 diabetes mellitus with hyperglycemia: Principal | ICD-10-CM

## 2018-07-29 DIAGNOSIS — E1140 Type 2 diabetes mellitus with diabetic neuropathy, unspecified: Secondary | ICD-10-CM

## 2018-08-08 DIAGNOSIS — N184 Chronic kidney disease, stage 4 (severe): Secondary | ICD-10-CM | POA: Diagnosis not present

## 2018-08-10 ENCOUNTER — Other Ambulatory Visit: Payer: Self-pay | Admitting: Pharmacy Technician

## 2018-08-10 NOTE — Patient Outreach (Signed)
[facility]) Care Management  08/10/2018  Manuel Kline 1932/03/03 [id number]    Successful outreach call placed to patient in regards to 2020 patient assistance with [name] for Humulin N and R.  Spoke to patient, HIPAA identifiers verified. Patient agreed to participate in patient assistance for 2020. Discussed with patient the requirements of the application process and patient verbalized understanding.  Patient also wanted to tell me that he received his hearing aids and was very excited that his insurance covered a percentage of the costs.  Prepared patient portion to be mailed. Faxed provider portion,  Will followup with patient in 7-10 business days to confirm receipt of application.  Niyah Mamaril P. Beauty Pless, [place] Management [phone]

## 2018-08-11 ENCOUNTER — Encounter: Payer: Self-pay | Admitting: Pharmacy Technician

## 2018-08-18 ENCOUNTER — Other Ambulatory Visit: Payer: Self-pay

## 2018-08-18 NOTE — Patient Outreach (Signed)
[name]) Care Management  08/18/2018   Manuel Kline 12/03/1931 [id number]  Subjective: Successful telephone call to the patient for assessment.  HIPAA verified.  The patient states that he has been doing fine.  His blood sugar today was 130.  He states that he tries to check his blood sugar daily but sometimes has problems getting blood to come out before the glucometer cuts off.  Discussed with him about sticking his finger first then putting the strip in.  The patient verbalized understanding. He denies any falls.  He is still having pain with his left foot bid toe.  He rates the pain at a 7/10 today.  He states that he has been eating a lot of candy for the holidays.  I advised the patient to monitor his sweets and eat them in moderation.  He verbalized understanding.  His next follow up for diabetes is in February.  Current Medications:  Current Outpatient Medications  Medication Sig Dispense Refill  . ACCU-CHEK GUIDE test strip USE 1 STRIP TO TEST 4 TIMES DAILY 400 each 11  . allopurinol (ZYLOPRIM) 300 MG tablet TAKE 1/2 (ONE-HALF) TABLET BY MOUTH ONCE DAILY 45 tablet 1  . aspirin 81 MG tablet Take 81 mg by mouth daily.     Marland Kitchen atorvastatin (LIPITOR) 80 MG tablet Take 0.5 tablets (40 mg total) by mouth daily. 90 tablet 3  . cloNIDine (CATAPRES) 0.1 MG tablet Take 1 tablet by mouth 2 (two) times daily.    . clotrimazole (LOTRIMIN) 1 % cream Apply topically 2 (two) times daily. 45 g 0  . Cyanocobalamin (VITAMIN B 12 PO) Take 1,000 mcg by mouth daily.    Marland Kitchen docusate sodium (STOOL SOFTENER) 100 MG capsule Take 100 mg by mouth daily.    . furosemide (LASIX) 40 MG tablet Take 1 tablet by mouth 2 (two) times daily.    Marland Kitchen gabapentin (NEURONTIN) 300 MG capsule Take 1 capsule (300 mg total) by mouth 2 (two) times daily. 180 capsule 1  . insulin NPH Human (HUMULIN N) 100 UNIT/ML injection INJECT 35 UNITS BID 30 mL 3  . insulin regular (HUMULIN R) 100 units/mL injection Inject 0.3  mLs (30 Units total) into the skin 2 (two) times daily before a meal. 60 mL 3  . Insulin Syringe-Needle U-100 30G X 3/16" 1 ML MISC 6 each by Does not apply route 6 (six) times daily. 200 each 5  . metoprolol succinate (TOPROL-XL) 50 MG 24 hr tablet Take 50 mg by mouth daily.    . Multiple Vitamin (MULTIVITAMIN) tablet Take 1 tablet by mouth daily.     . nitroGLYCERIN (NITROSTAT) 0.4 MG SL tablet Place 0.4 mg under the tongue every 5 (five) minutes as needed for chest pain.    . Omega-3 Fatty Acids (FISH OIL) 1000 MG CPDR Take 1 tablet by mouth daily.    . polyethylene glycol powder (GLYCOLAX/MIRALAX) powder Take 17 g by mouth 2 (two) times daily as needed. 3350 g 1  . quinapril (ACCUPRIL) 40 MG tablet Take 40 mg by mouth at bedtime.      No current facility-administered medications for this visit.     Functional Status:  In your present state of health, do you have any difficulty performing the following activities: 03/23/2018 03/23/2018  Hearing? [name]  Vision? N N  Difficulty concentrating or making decisions? N -  Walking or climbing stairs? N -  Dressing or bathing? N -  Doing errands, shopping? N -  Preparing  Food and eating ? N -  Using the Toilet? N -  In the past six months, have you accidently leaked urine? N -  Do you have problems with loss of bowel control? N -  Managing your Medications? N -  Managing your Finances? N -  Housekeeping or managing your Housekeeping? N -  Some recent data might be hidden    Fall/Depression Screening: Fall Risk  08/18/2018 08/18/2018 07/21/2018  Falls in the past year? 0 0 0  Number falls in past yr: - - -  Injury with Fall? - - -  Comment - - -  Risk Factor Category  - - -  Risk for fall due to : - - -  Risk for fall due to: Comment - - -  Follow up - - -  Comment - - -   PHQ 2/9 Scores 07/19/2018 05/26/2018 05/13/2018 04/11/2018 03/23/2018 01/31/2018 01/10/2018  PHQ - 2 Score 0 0 0 0 0 0 0    Assessment: Patient will continue to benefit  from health coach outreach for disease management and support. THN CM Care Plan Problem One     Most Recent Value  THN Long Term Goal   in 60 days the patient will lower his a1c of 8.0 by 1-2 points  [facility] Long Term Goal Start Date  08/18/18  Interventions for Problem One Long Term Goal  Reviewed blood sugar readings, diet, and meication adherence       Plan: RN Health Coach will contact patient in the month of February and patient agrees to next outreach.   [name] RN, BSN, [place] Direct Dial:  [phone]  Fax: [phone]

## 2018-08-22 ENCOUNTER — Other Ambulatory Visit: Payer: Self-pay | Admitting: Pharmacy Technician

## 2018-08-22 NOTE — Patient Outreach (Signed)
[place] [facility]) Care Management  08/22/2018  ANIEL HUBBLE 06-Aug-1932 [id number]    Successful outreach call placed to patient in regards to [place] patient assistance application for Humulin N & R.  Spoke to patient's wife, [place]. HIPPA identifiers verified. Alice informed me that they had received the patient's medication assistance forms in the mail but had not had the opportunity to work on them yet due to the holiday. Alice said she would try and get them in the mail for her husband before the end of the year.    Will followup with patient in 10-14 business days if applications have not been received.  Carsyn Taubman P. Adrean Findlay, [place] Management [phone]

## 2018-09-01 ENCOUNTER — Other Ambulatory Visit: Payer: Self-pay | Admitting: Pharmacy Technician

## 2018-09-01 NOTE — Patient Outreach (Signed)
[facility] [facility]) Care Management  09/01/2018  Manuel Kline Feb 17, 1932 [id number]    Received all necessary documents and signatures from both patient and provider for [name] patient assistance application for Humulin N and Humulin R.  Submitted completed application to [employer] via fax.  Will followup in 10-14 business days to inquire on status of application.  Camreigh Michie P. Jaymeson Mengel, [place] Management [phone]

## 2018-09-12 ENCOUNTER — Other Ambulatory Visit: Payer: Self-pay | Admitting: Pharmacy Technician

## 2018-09-12 ENCOUNTER — Telehealth: Payer: Self-pay | Admitting: Pediatrics

## 2018-09-12 DIAGNOSIS — IMO0002 Reserved for concepts with insufficient information to code with codable children: Secondary | ICD-10-CM

## 2018-09-12 DIAGNOSIS — E1140 Type 2 diabetes mellitus with diabetic neuropathy, unspecified: Secondary | ICD-10-CM

## 2018-09-12 DIAGNOSIS — E1165 Type 2 diabetes mellitus with hyperglycemia: Principal | ICD-10-CM

## 2018-09-12 NOTE — Patient Outreach (Signed)
[place] [facility]) Care Management  09/12/2018  Manuel Kline 03-02-1932 [id number]    Care coordination call placed to patient in regards to his [name] application for Humulin N and R.   Spoke to [place] who said the application was missing valid proof of income. Informed Lovena Le that the patient submitted his and his wife's social security statements and to double check. Lovena Le apologized and said the information submitted for proof of income was valid. Inquired from [place] if the application could be sent over as urgent processing with expedited shipment as patient is running low on his medication. Lovena Le said she could do this but it still could take anywhere from 5-10 business days for urgent processing and another 7-10 business days for delivery.  Will followup with [name] in 5-7 business days to check on status of application.  Yanelli Zapanta P. Shaquia Berkley, [place] Management [phone]

## 2018-09-12 NOTE — Patient Outreach (Signed)
[facility]) Care Management  09/12/2018  CHALLEN SPAINHOUR 08/09/1932 [id number]    Successful outgoing call placed to patient in regards to his medication assistance application with [name] for Humulin N and R.  Spoke to Mr. Dargis, HIPPA identifiers verified. Informed Mr. Hillmer that his application was still in the processing phase for [name]. Mr. Mallari stated he was running low on the Humulin N and may be out by the end of the week. Informed Mr. Sippel that he will probably have to pick up a supply from his local pharmacy due to processing times that [name] informed me of. Mr Sanger asked if he could double up on the Humulin R and the patient was advised to call his provider before altering his insulin doses. Informed Mr. Hartis that I would followup with him in 3-5 business days to update him on the status of the application. Patient verbalized understandin.  Maisyn Nouri P. Heath Badon, [place] Management [phone]

## 2018-09-12 NOTE — Patient Outreach (Signed)
[facility]) Care Management  09/12/2018  Manuel Kline May 08, 1932 [id number]   ADDENDUM  Care coordination call placed to [facility] at Dr. Arbie Cookey Vincent's office in regards to Humulin N.  Informed Lattie Haw that patient called me stating he was running low on his Humulin N. He inquire if he could double up on the Humulin R. Advised patient to phone your office. Inquired if the office could phone in a refill of the Humulin N to his local pharmacy until the [place] application could be processed. According to [name] that could be anywhere from 5-10 business days. Lattie Haw took a message and would send it back to the nurses so a refill could be phoned into his pharmacy for pickup.  Caleb Prigmore P. Jamisha Hoeschen, [place] Management [phone]

## 2018-09-12 NOTE — Telephone Encounter (Signed)
Does quantity need to be increase to cover doses? Please advise on refill. Last seen in November.

## 2018-09-13 MED ORDER — INSULIN NPH (HUMAN) (ISOPHANE) 100 UNIT/ML ~~LOC~~ SUSP: SUBCUTANEOUS | 3 refills | Status: DC

## 2018-09-13 NOTE — Telephone Encounter (Signed)
Patient aware and verbalizes understanding. 

## 2018-09-14 ENCOUNTER — Other Ambulatory Visit: Payer: Self-pay | Admitting: Pharmacy Technician

## 2018-09-14 NOTE — Patient Outreach (Signed)
[facility] [facility]) Care Management  09/14/2018  VONDELL BABERS May 28, 1932 [id number]   Care coordination call placed to [facility] in regards to patients Humulin N and Humulin R patient assistance application.  Spoke to [place] who said patient had been APPROVED as of 09/13/2018 through 08/31/2019. Katelyn informed me that an order had been created on 09/13/2018 and that the shipment is to be expedited but still could take up to 5 business days to arrive at the provider's office. Patient should be receiving 9 vials of Humulin N and 8 vials of Humulin R.  Will followup with patient in 7-10 business days to confirm receipt of medication.  Roe Wilner P. Kiki Bivens, [place] Management [phone]

## 2018-09-16 ENCOUNTER — Other Ambulatory Visit: Payer: Self-pay | Admitting: Pharmacy Technician

## 2018-09-16 ENCOUNTER — Telehealth: Payer: Self-pay | Admitting: *Deleted

## 2018-09-16 LAB — CUP PACEART REMOTE DEVICE CHECK
Brady Statistic AP VP Percent: 0.05 %
Brady Statistic AP VS Percent: 84.45 %
Brady Statistic AS VS Percent: 15.5 %
Brady Statistic RV Percent Paced: 0.05 %
HIGH POWER IMPEDANCE MEASURED VALUE: 67 Ohm
HighPow Impedance: 51 Ohm
Implantable Lead Implant Date: 20030519
Implantable Lead Location: 753860
Implantable Lead Model: 4087
Implantable Lead Serial Number: 159999
Lead Channel Impedance Value: 399 Ohm
Lead Channel Impedance Value: 4047 Ohm
Lead Channel Impedance Value: 4047 Ohm
Lead Channel Impedance Value: 4047 Ohm
Lead Channel Impedance Value: 456 Ohm
Lead Channel Impedance Value: 456 Ohm
Lead Channel Pacing Threshold Amplitude: 1.25 V
Lead Channel Pacing Threshold Pulse Width: 0.4 ms
Lead Channel Pacing Threshold Pulse Width: 0.4 ms
Lead Channel Setting Pacing Amplitude: 2.5 V
Lead Channel Setting Sensing Sensitivity: 0.3 mV
MDC IDC LEAD IMPLANT DT: 20030519
MDC IDC LEAD LOCATION: 753859
MDC IDC LEAD SERIAL: 115102
MDC IDC MSMT BATTERY REMAINING LONGEVITY: 86 mo
MDC IDC MSMT BATTERY VOLTAGE: 3 V
MDC IDC MSMT LEADCHNL RA SENSING INTR AMPL: 2.25 mV
MDC IDC MSMT LEADCHNL RA SENSING INTR AMPL: 2.25 mV
MDC IDC MSMT LEADCHNL RV PACING THRESHOLD AMPLITUDE: 0.75 V
MDC IDC MSMT LEADCHNL RV SENSING INTR AMPL: 10.625 mV
MDC IDC MSMT LEADCHNL RV SENSING INTR AMPL: 10.625 mV
MDC IDC PG IMPLANT DT: 20171114
MDC IDC SESS DTM: 20191126143325
MDC IDC SET LEADCHNL RV PACING AMPLITUDE: 2.5 V
MDC IDC SET LEADCHNL RV PACING PULSEWIDTH: 0.4 ms
MDC IDC STAT BRADY AS VP PERCENT: 0 %
MDC IDC STAT BRADY RA PERCENT PACED: 84.49 %

## 2018-09-16 NOTE — Patient Outreach (Signed)
[place] [facility]) Care Management  09/16/2018  Manuel Kline 1932-03-26 [id number]    Care coordination call placed to [name] cares in regards to patient's application for Humulin N & R.  Spoke to [place] who said the medication of 9 vials of Humulin N and 8 vials of Humulin R are out for delivery today to the provider's office.  Will inform [name] of this information.  [name] P. Katelinn Justice, [place] Management [phone]

## 2018-09-16 NOTE — Patient Outreach (Signed)
[facility]) Care Management  09/16/2018  Manuel Kline 1932/07/15 [id number]    Successful outgoing call placed to patient in regards to his [facility] application for Humulin N and R.  Spoke to Manuel Kline, HIPAA identifiers verified. Informed Manuel Kline that his Humulin N and R would be delivered to that provider's office today. Discussed with patient how to obtain his refills. Patient verbalized understanding. Inquired if patient had any other questions or concerns and he stated not at this time. Confirmed with patient that he had our name and number and if he had any issues obtaining his medication through [employer] to call. Patient verbalized understanding.  Will remove myself from care team as Pharmacy Assistance has been completed.  Darien Mignogna P. Daelynn Blower, [place] Management [phone]

## 2018-09-16 NOTE — Telephone Encounter (Signed)
Telephone call to patient that his [facility] [place] medication arrived at our office. Humulin N 100U/ml 18ml vial x 9 vials and Humulin R 100U/ml 59ml vial x 8 vials. All vials placed in the lab refrigerator in a brown paper bag labeled with patient's name and DOB.

## 2018-09-23 ENCOUNTER — Other Ambulatory Visit: Payer: Self-pay | Admitting: Cardiology

## 2018-09-23 MED ORDER — FUROSEMIDE 40 MG PO TABS: 40.0000 mg | ORAL_TABLET | Freq: Two times a day (BID) | ORAL | 1 refills | Status: DC

## 2018-09-23 NOTE — Telephone Encounter (Signed)
Rx for furosemide 40mg  one tablet twice daily #60 with 1 refill  sent to [facility] as requested.  Patient is scheduled to see Dr [name] Cree on 09-29-2018.

## 2018-09-23 NOTE — Telephone Encounter (Signed)
°  1. Which medications need to be refilled? (please list name of each medication and dose if known)Furosemide 40mg  tablet  2. Which pharmacy/location (including street and city if local pharmacy) is medication to be sent to?[place] [phone]  3. Do they need a 30 day or 90 day supply? [place]

## 2018-09-29 ENCOUNTER — Encounter: Payer: Self-pay | Admitting: Cardiology

## 2018-09-29 ENCOUNTER — Ambulatory Visit: Payer: Medicare Other | Admitting: Cardiology

## 2018-09-29 VITALS — BP 120/64 | HR 61 | Ht 66.0 in | Wt 234.5 lb

## 2018-09-29 DIAGNOSIS — I10 Essential (primary) hypertension: Secondary | ICD-10-CM

## 2018-09-29 DIAGNOSIS — I5022 Chronic systolic (congestive) heart failure: Secondary | ICD-10-CM

## 2018-09-29 DIAGNOSIS — I255 Ischemic cardiomyopathy: Secondary | ICD-10-CM

## 2018-09-29 DIAGNOSIS — I2510 Atherosclerotic heart disease of native coronary artery without angina pectoris: Secondary | ICD-10-CM | POA: Diagnosis not present

## 2018-09-29 DIAGNOSIS — I4729 Other ventricular tachycardia: Secondary | ICD-10-CM

## 2018-09-29 DIAGNOSIS — Z951 Presence of aortocoronary bypass graft: Secondary | ICD-10-CM

## 2018-09-29 DIAGNOSIS — I472 Ventricular tachycardia: Secondary | ICD-10-CM | POA: Diagnosis not present

## 2018-09-29 DIAGNOSIS — Z9989 Dependence on other enabling machines and devices: Secondary | ICD-10-CM

## 2018-09-29 DIAGNOSIS — Z95810 Presence of automatic (implantable) cardiac defibrillator: Secondary | ICD-10-CM

## 2018-09-29 DIAGNOSIS — G4733 Obstructive sleep apnea (adult) (pediatric): Secondary | ICD-10-CM

## 2018-09-29 NOTE — Progress Notes (Signed)
Cardiology Office Note:    Date:  09/29/2018   ID:  Manuel Kline, DOB 1932/01/24, MRN [id number]  PCP:  Dettinger, Fransisca Kaufmann, MD  Cardiologist:  Jenne Campus, MD    Referring MD: Eustaquio Maize, MD   Chief Complaint  Patient presents with  . Follow-up  Doing well  History of Present Illness:    Manuel Kline is a 85 y.o. male patient of Dr. Wynonia Lawman with past medical history significant for coronary artery disease, congestive heart failure, ischemic cardiomyopathy last estimation of left ventricular ejection fraction normal echocardiogram in 2015 which was 40% he comes today to my office overall he is doing well denies having any chest pain tightness squeezing pressure burning chest he does have some shortness of breath.  He described to have some chronic joint aches in his legs which prevented him from exercising or walking on the regular basis.  Well however she still very active at church and trying to be active as much as he can.  Past Medical History:  Diagnosis Date  . Adenomatous colon polyp 2006  . CAD (coronary artery disease)   . Calcium oxalate renal stones   . Cardiomyopathy   . Cataract   . Diabetes ([place])   . Erectile dysfunction   . Erosive esophagitis   . Hemorrhoids   . HLD (hyperlipidemia)   . HTN (hypertension)   . Hypertensive heart disease without CHF 07/31/2011  . ICD (implantable cardiac defibrillator) in place   . ICD dual chamber in situ   . Metabolic syndrome   . Morbid obesity ([place])   . Osteoarthritis   . S/P CABG (coronary artery bypass graft) 11/02/2000  . Sleep apnea     Past Surgical History:  Procedure Laterality Date  . ABDOMINAL EXPLORATION SURGERY    . BACK SURGERY     X'3  . cardiac bypass    . CARDIAC DEFIBRILLATOR PLACEMENT    . CARPAL TUNNEL RELEASE     X2, bilateral  . CATARACT EXTRACTION    . COLONOSCOPY  06/20/2012   Procedure: COLONOSCOPY;  Surgeon: Sable Feil, MD;  Location: [facility];  Service: Endoscopy;   Laterality: N/A;  . DOPPLER ECHOCARDIOGRAPHY  2003  . EP IMPLANTABLE DEVICE N/A 07/14/2016   Procedure: ICD Generator Changeout;  Surgeon: Evans Lance, MD;  Location: [facility];  Service: Cardiovascular;  Laterality: N/A;  . ESOPHAGOGASTRODUODENOSCOPY  06/20/2012   Procedure: ESOPHAGOGASTRODUODENOSCOPY (EGD);  Surgeon: Sable Feil, MD;  Location: [facility];  Service: Endoscopy;  Laterality: N/A;  . EYE SURGERY    . LAPAROTOMY    . RETINOPATHY SURGERY Bilateral   . rotator cuff surgery     left    Current Medications: Current Meds  Medication Sig  . ACCU-CHEK GUIDE test strip USE 1 STRIP TO TEST 4 TIMES DAILY  . allopurinol (ZYLOPRIM) 300 MG tablet TAKE 1/2 (ONE-HALF) TABLET BY MOUTH ONCE DAILY  . aspirin 81 MG tablet Take 81 mg by mouth daily.   Marland Kitchen atorvastatin (LIPITOR) 80 MG tablet Take 0.5 tablets (40 mg total) by mouth daily.  . cloNIDine (CATAPRES) 0.1 MG tablet Take 1 tablet by mouth 2 (two) times daily.  . Cyanocobalamin (VITAMIN B 12 PO) Take 1,000 mcg by mouth daily.  . furosemide (LASIX) 40 MG tablet Take 1 tablet (40 mg total) by mouth 2 (two) times daily.  Marland Kitchen gabapentin (NEURONTIN) 300 MG capsule Take 1 capsule (300 mg total) by mouth 2 (two) times daily.  Marland Kitchen  insulin NPH Human (HUMULIN N) 100 UNIT/ML injection INJECT 35 UNITS BID  . insulin regular (HUMULIN R) 100 units/mL injection Inject 0.3 mLs (30 Units total) into the skin 2 (two) times daily before a meal.  . Insulin Syringe-Needle U-100 30G X 3/16" 1 ML MISC 6 each by Does not apply route 6 (six) times daily.  . metoprolol succinate (TOPROL-XL) 50 MG 24 hr tablet Take 50 mg by mouth daily.  . Multiple Vitamin (MULTIVITAMIN) tablet Take 1 tablet by mouth daily.   . nitroGLYCERIN (NITROSTAT) 0.4 MG SL tablet Place 0.4 mg under the tongue every 5 (five) minutes as needed for chest pain.  . Omega-3 Fatty Acids (FISH OIL) 1000 MG CPDR Take 1 tablet by mouth daily.  . polyethylene glycol powder  (GLYCOLAX/MIRALAX) powder Take 17 g by mouth 2 (two) times daily as needed.  . quinapril (ACCUPRIL) 40 MG tablet Take 40 mg by mouth at bedtime.      Allergies:   Patient has no known allergies.   Social History   Socioeconomic History  . Marital status: Married    Spouse name: Not on file  . Number of children: 4  . Years of education: 60  . Highest education level: 11th grade  Occupational History  . Occupation: Retired from [employer]: RETIRED  Social Needs  . Financial resource strain: Not hard at all  . Food insecurity:    Worry: Never true    Inability: Never true  . Transportation needs:    Medical: No    Non-medical: No  Tobacco Use  . Smoking status: Never Smoker  . Smokeless tobacco: Never Used  Substance and Sexual Activity  . Alcohol use: No    Alcohol/week: 0.0 standard drinks  . Drug use: No  . Sexual activity: Yes  Lifestyle  . Physical activity:    Days per week: 0 days    Minutes per session: 0 min  . Stress: Not at all  Relationships  . Social connections:    Talks on phone: More than three times a week    Gets together: More than three times a week    Attends religious service: More than 4 times per year    Active member of club or organization: Yes    Attends meetings of clubs or organizations: More than 4 times per year    Relationship status: Married  Other Topics Concern  . Not on file  Social History Narrative   Right handed, Married, 4 kids from previous marriage.  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 Caffeine 1 cup daily.     Family History: The patient's family history includes Cancer in his brother; Diabetes in his brother, brother, brother, mother, sister, sister, sister, sister, and sister; Heart disease in his brother, father, and mother; Kidney disease in his sister; Throat cancer in his paternal uncle. ROS:   Please see the history of present illness.    All 14 point review of systems negative except as described per history of  present illness  EKGs/Labs/Other Studies Reviewed:    EKG showed normal sinus rhythm, left bundle branch block.  Recent Labs: 07/19/2018: ALT 27; BUN 31; Creatinine, Ser 2.16; Potassium 4.6; Sodium 140  Recent Lipid Panel    Component Value Date/Time   CHOL 121 07/19/2018 0832   TRIG 203 (H) 07/19/2018 0832   HDL 35 (L) 07/19/2018 0832   CHOLHDL 3.5 07/19/2018 0832   LDLCALC 45 07/19/2018 0832    Physical Exam:  VS:  There were no vitals taken for this visit.    Wt Readings from Last 3 Encounters:  07/19/18 237 lb 6.4 oz (107.7 kg)  07/13/18 238 lb (108 kg)  05/13/18 234 lb (106.1 kg)     GEN:  Well nourished, well developed in no acute distress HEENT: Normal NECK: No JVD; No carotid bruits LYMPHATICS: No lymphadenopathy CARDIAC: RRR, no murmurs, no rubs, no gallops RESPIRATORY:  Clear to auscultation without rales, wheezing or rhonchi  ABDOMEN: Soft, non-tender, non-distended MUSCULOSKELETAL:  No edema; No deformity  SKIN: Warm and dry LOWER EXTREMITIES: no swelling NEUROLOGIC:  Alert and oriented x 3 PSYCHIATRIC:  Normal affect   ASSESSMENT:    1. Coronary artery disease involving native coronary artery of native heart without angina pectoris   2. Chronic systolic heart failure ([place])   3. Essential hypertension   4. Ventricular tachycardia (paroxysmal) ([place])   5. OSA on CPAP   6. S/P CABG (coronary artery bypass graft)   7. Presence of automatic (implantable) cardiac defibrillator    PLAN:    In order of problems listed above:  1. Coronary artery disease status post coronary artery bypass graft.  Asymptomatic denies have any chest pain tightness squeezing pressure burning chest.  On appropriate medications which I will continue. 2. Chronic systolic congestive heart failure we will recheck his left ventricular ejection fraction by doing echocardiogram.  In the meantime we will continue with his medications.  He is at high blocker as well as ACE inhibitor  which I will continue. 3. Chronic kidney failure.  Followed by nephrology in [place].  Stable. 4. Obstructive sleep apnea on CPAP mask. 5. Dyslipidemia.  Last fasting lipid profile excellent we will continue with present therapy. 6. Presence of ICD.  Followed by our EP team.   Medication Adjustments/Labs and Tests Ordered: Current medicines are reviewed at length with the patient today.  Concerns regarding medicines are outlined above.  No orders of the defined types were placed in this encounter.  Medication changes: No orders of the defined types were placed in this encounter.   Signed, Park Liter, MD, [facility] 09/29/2018 10:48 AM    [place]

## 2018-09-29 NOTE — Patient Instructions (Signed)
Medication Instructions:  Your physician recommends that you continue on your current medications as directed. Please refer to the Current Medication list given to you today.  If you need a refill on your cardiac medications before your next appointment, please call your pharmacy.   Lab work: None  If you have labs (blood work) drawn today and your tests are completely normal, you will receive your results only by: Marland Kitchen MyChart Message (if you have MyChart) OR . A paper copy in the mail If you have any lab test that is abnormal or we need to change your treatment, we will call you to review the results.  Testing/Procedures: You had an EKG today.   Your physician has requested that you have an echocardiogram. Echocardiography is a painless test that uses sound waves to create images of your heart. It provides your doctor with information about the size and shape of your heart and how well your heart's chambers and valves are working. This procedure takes approximately one hour. There are no restrictions for this procedure.  Follow-Up: At [facility], you and your health needs are our priority.  As part of our continuing mission to provide you with exceptional heart care, we have created designated Provider Care Teams.  These Care Teams include your primary Cardiologist (physician) and Advanced Practice Providers (APPs -  Physician Assistants and Nurse Practitioners) who all work together to provide you with the care you need, when you need it. You will need a follow up appointment in 6 months.  Please call our office 2 months in advance to schedule this appointment.      Echocardiogram An echocardiogram is a procedure that uses painless sound waves (ultrasound) to produce an image of the heart. Images from an echocardiogram can provide important information about:  Signs of coronary artery disease (CAD).  Aneurysm detection. An aneurysm is a weak or damaged part of an artery wall that  bulges out from the normal force of blood pumping through the body.  Heart size and shape. Changes in the size or shape of the heart can be associated with certain conditions, including heart failure, aneurysm, and CAD.  Heart muscle function.  Heart valve function.  Signs of a past heart attack.  Fluid buildup around the heart.  Thickening of the heart muscle.  A tumor or infectious growth around the heart valves. Tell a health care provider about:  Any allergies you have.  All medicines you are taking, including vitamins, herbs, eye drops, creams, and over-the-counter medicines.  Any blood disorders you have.  Any surgeries you have had.  Any medical conditions you have.  Whether you are pregnant or may be pregnant. What are the risks? Generally, this is a safe procedure. However, problems may occur, including:  Allergic reaction to dye (contrast) that may be used during the procedure. What happens before the procedure? No specific preparation is needed. You may eat and drink normally. What happens during the procedure?   An IV tube may be inserted into one of your veins.  You may receive contrast through this tube. A contrast is an injection that improves the quality of the pictures from your heart.  A gel will be applied to your chest.  A wand-like tool (transducer) will be moved over your chest. The gel will help to transmit the sound waves from the transducer.  The sound waves will harmlessly bounce off of your heart to allow the heart images to be captured in real-time motion. The images  will be recorded on a computer. The procedure may vary among health care providers and hospitals. What happens after the procedure?  You may return to your normal, everyday life, including diet, activities, and medicines, unless your health care provider tells you not to do that. Summary  An echocardiogram is a procedure that uses painless sound waves (ultrasound) to produce  an image of the heart.  Images from an echocardiogram can provide important information about the size and shape of your heart, heart muscle function, heart valve function, and fluid buildup around your heart.  You do not need to do anything to prepare before this procedure. You may eat and drink normally.  After the echocardiogram is completed, you may return to your normal, everyday life, unless your health care provider tells you not to do that. This information is not intended to replace advice given to you by your health care provider. Make sure you discuss any questions you have with your health care provider. Document Released: 08/14/2000 Document Revised: 09/19/2016 Document Reviewed: 09/19/2016 Elsevier Interactive Patient Education  2019 [employer].

## 2018-10-13 ENCOUNTER — Ambulatory Visit (HOSPITAL_BASED_OUTPATIENT_CLINIC_OR_DEPARTMENT_OTHER)
Admission: RE | Admit: 2018-10-13 | Discharge: 2018-10-13 | Disposition: A | Discharge status: Home / Self Care | Payer: Medicare Other | Source: Ambulatory Visit | Attending: Cardiology | Admitting: Cardiology

## 2018-10-13 ENCOUNTER — Ambulatory Visit: Payer: Self-pay

## 2018-10-13 DIAGNOSIS — I10 Essential (primary) hypertension: Secondary | ICD-10-CM | POA: Diagnosis not present

## 2018-10-13 DIAGNOSIS — I5022 Chronic systolic (congestive) heart failure: Secondary | ICD-10-CM | POA: Diagnosis not present

## 2018-10-13 DIAGNOSIS — I255 Ischemic cardiomyopathy: Secondary | ICD-10-CM

## 2018-10-13 DIAGNOSIS — I2510 Atherosclerotic heart disease of native coronary artery without angina pectoris: Secondary | ICD-10-CM | POA: Insufficient documentation

## 2018-10-13 MED ORDER — PERFLUTREN LIPID MICROSPHERE
1.0000 mL | INTRAVENOUS | Status: AC | PRN
  Administered 2018-10-13: 4 mL via INTRAVENOUS
  Filled 2018-10-13: qty 10

## 2018-10-13 NOTE — Progress Notes (Signed)
*  PR Echocardiogram 2D Echocardiogram has been performed.  Manuel Kline Manuel Kline 10/13/2018, 10:46 AM

## 2018-10-17 ENCOUNTER — Telehealth: Payer: Self-pay | Admitting: Cardiology

## 2018-10-17 DIAGNOSIS — I5022 Chronic systolic (congestive) heart failure: Secondary | ICD-10-CM

## 2018-10-17 DIAGNOSIS — E785 Hyperlipidemia, unspecified: Secondary | ICD-10-CM

## 2018-10-17 MED ORDER — QUINAPRIL HCL 40 MG PO TABS: 40.0000 mg | ORAL_TABLET | Freq: Every day | ORAL | 0 refills | Status: DC

## 2018-10-17 MED ORDER — CLONIDINE HCL 0.1 MG PO TABS: 0.1000 mg | ORAL_TABLET | Freq: Two times a day (BID) | ORAL | 0 refills | Status: DC

## 2018-10-17 MED ORDER — ATORVASTATIN CALCIUM 80 MG PO TABS: 40.0000 mg | ORAL_TABLET | Freq: Every day | ORAL | 0 refills | Status: DC

## 2018-10-17 MED ORDER — METOPROLOL SUCCINATE ER 50 MG PO TB24: 50.0000 mg | ORAL_TABLET | Freq: Every day | ORAL | 0 refills | Status: DC

## 2018-10-17 MED ORDER — FUROSEMIDE 40 MG PO TABS: 40.0000 mg | ORAL_TABLET | Freq: Two times a day (BID) | ORAL | 0 refills | Status: DC

## 2018-10-17 NOTE — Telephone Encounter (Signed)
Atorvastatin  80 mg (1/2 tablet daily), Clonidine 0.1 mg twice daily, lasix 40 mg twice daily, metoprolol succinate 50 mg daily, and quinapril 40 mg daily refilled.

## 2018-10-17 NOTE — Telephone Encounter (Signed)
Patient is out of most medications   1. Which medications need to be refilled? (please list name of each medication and dose if known) Atorvastatin 80mg ; clonidine 0.1mg ; furosemide 40mg ; metoprolol succ 50mg ; quinapril 40mg   2. Which pharmacy/location (including street and city if local pharmacy) is medication to be sent to?[facility] in [place]  3. Do they need a 30 day or 90 day supply? [place]

## 2018-10-19 ENCOUNTER — Other Ambulatory Visit: Payer: Self-pay | Admitting: Cardiology

## 2018-10-19 ENCOUNTER — Other Ambulatory Visit: Payer: Self-pay

## 2018-10-19 ENCOUNTER — Ambulatory Visit: Payer: Medicare Other | Admitting: Pediatrics

## 2018-10-19 DIAGNOSIS — I5022 Chronic systolic (congestive) heart failure: Secondary | ICD-10-CM

## 2018-10-19 NOTE — Patient Outreach (Signed)
[facility]) Care Management  10/19/2018   Manuel Kline 13-Jul-1932 [id number]  Subjective: Successful outreach to the patient. HIPAA verified.  The patient states that he feels the best he has ever felt.  He denies any pain and falls.  He states that he did twist to fast in the bed room and landed on the bed.  He did not hit the floor or injure himself.  Discussed fall precautions with the patient and he verbalized understanding.  He states that his blood sugar was 90 this morning. He is eating better and taking his medications as prescribed.  He states that he has two new physician his PCP is Dr Building control surveyor and Cardiologist Dr [name] Cree.  He states that he is very pleased with his physician.  He states that he had an Echo at the end of January and he was told that his heart is stronger.  He will follow up with cardiology in June.  He has an appointment tomorrow to recheck his diabetes.  Current Medications:  Current Outpatient Medications  Medication Sig Dispense Refill  . ACCU-CHEK GUIDE test strip USE 1 STRIP TO TEST 4 TIMES DAILY 400 each 11  . allopurinol (ZYLOPRIM) 300 MG tablet TAKE 1/2 (ONE-HALF) TABLET BY MOUTH ONCE DAILY 45 tablet 1  . aspirin 81 MG tablet Take 81 mg by mouth daily.     Marland Kitchen atorvastatin (LIPITOR) 80 MG tablet Take 0.5 tablets (40 mg total) by mouth daily. 45 tablet 0  . cloNIDine (CATAPRES) 0.1 MG tablet Take 1 tablet (0.1 mg total) by mouth 2 (two) times daily. 180 tablet 0  . Cyanocobalamin (VITAMIN B 12 PO) Take 1,000 mcg by mouth daily.    . furosemide (LASIX) 40 MG tablet Take 1 tablet (40 mg total) by mouth 2 (two) times daily. 180 tablet 0  . gabapentin (NEURONTIN) 300 MG capsule Take 1 capsule (300 mg total) by mouth 2 (two) times daily. 180 capsule 1  . insulin NPH Human (HUMULIN N) 100 UNIT/ML injection INJECT 35 UNITS BID 63 mL 3  . insulin regular (HUMULIN R) 100 units/mL injection Inject 0.3 mLs (30 Units total) into the skin 2 (two) times  daily before a meal. 60 mL 3  . Insulin Syringe-Needle U-100 30G X 3/16" 1 ML MISC 6 each by Does not apply route 6 (six) times daily. 200 each 5  . metoprolol succinate (TOPROL-XL) 50 MG 24 hr tablet Take 1 tablet (50 mg total) by mouth daily. 90 tablet 0  . Multiple Vitamin (MULTIVITAMIN) tablet Take 1 tablet by mouth daily.     . nitroGLYCERIN (NITROSTAT) 0.4 MG SL tablet Place 0.4 mg under the tongue every 5 (five) minutes as needed for chest pain.    . Omega-3 Fatty Acids (FISH OIL) 1000 MG CPDR Take 1 tablet by mouth daily.    . polyethylene glycol powder (GLYCOLAX/MIRALAX) powder Take 17 g by mouth 2 (two) times daily as needed. 3350 g 1  . quinapril (ACCUPRIL) 40 MG tablet Take 1 tablet (40 mg total) by mouth at bedtime. 90 tablet 0   No current facility-administered medications for this visit.     Functional Status:  In your present state of health, do you have any difficulty performing the following activities: 03/23/2018 03/23/2018  Hearing? [name]  Vision? N N  Difficulty concentrating or making decisions? N -  Walking or climbing stairs? N -  Dressing or bathing? N -  Doing errands, shopping? N -  Conservation officer, nature and  eating ? N -  Using the Toilet? N -  In the past six months, have you accidently leaked urine? N -  Do you have problems with loss of bowel control? N -  Managing your Medications? N -  Managing your Finances? N -  Housekeeping or managing your Housekeeping? N -  Some recent data might be hidden    Fall/Depression Screening: Fall Risk  10/19/2018 08/18/2018 08/18/2018  Falls in the past year? 0 0 0  Number falls in past yr: - - -  Injury with Fall? - - -  Comment - - -  Risk Factor Category  - - -  Risk for fall due to : - - -  Risk for fall due to: Comment - - -  Follow up - - -  Comment - - -   PHQ 2/9 Scores 07/19/2018 05/26/2018 05/13/2018 04/11/2018 03/23/2018 01/31/2018 01/10/2018  PHQ - 2 Score 0 0 0 0 0 0 0    Assessment: Patient will continue to  benefit from health coach outreach for disease management and support.  [facility] CM Care Plan Problem One     Most Recent Value  Care Plan Problem One  Knowledge deficit related to diease management of diabetes  Role Documenting the Problem One  Health Coach  Care Plan for Problem One  Active  [facility] Long Term Goal   in 30 days the patient will lower his a1c of 8.0 by 1-2 points  [facility] Long Term Goal Start Date  10/19/18  Interventions for Problem One Long Term Goal  Discussed diet, blood sugar readins and medication adherence       Plan: RN Health Coach will contact patient in the month of March and patient agrees to next outreach.   [name] RN, BSN, [place] Direct Dial:  [phone]  Fax: [phone]

## 2018-10-20 ENCOUNTER — Encounter: Payer: Self-pay | Admitting: Family Medicine

## 2018-10-20 ENCOUNTER — Ambulatory Visit (INDEPENDENT_AMBULATORY_CARE_PROVIDER_SITE_OTHER): Payer: Medicare Other | Admitting: Family Medicine

## 2018-10-20 VITALS — BP 138/83 | HR 62 | Temp 97.9°F | Ht 66.0 in | Wt 234.0 lb

## 2018-10-20 DIAGNOSIS — G629 Polyneuropathy, unspecified: Secondary | ICD-10-CM | POA: Diagnosis not present

## 2018-10-20 DIAGNOSIS — I10 Essential (primary) hypertension: Secondary | ICD-10-CM

## 2018-10-20 DIAGNOSIS — E1140 Type 2 diabetes mellitus with diabetic neuropathy, unspecified: Secondary | ICD-10-CM | POA: Diagnosis not present

## 2018-10-20 DIAGNOSIS — E782 Mixed hyperlipidemia: Secondary | ICD-10-CM | POA: Diagnosis not present

## 2018-10-20 DIAGNOSIS — E1142 Type 2 diabetes mellitus with diabetic polyneuropathy: Secondary | ICD-10-CM

## 2018-10-20 DIAGNOSIS — Z8739 Personal history of other diseases of the musculoskeletal system and connective tissue: Secondary | ICD-10-CM | POA: Diagnosis not present

## 2018-10-20 DIAGNOSIS — N184 Chronic kidney disease, stage 4 (severe): Secondary | ICD-10-CM

## 2018-10-20 DIAGNOSIS — H60332 Swimmer's ear, left ear: Secondary | ICD-10-CM

## 2018-10-20 DIAGNOSIS — IMO0002 Reserved for concepts with insufficient information to code with codable children: Secondary | ICD-10-CM

## 2018-10-20 DIAGNOSIS — E1165 Type 2 diabetes mellitus with hyperglycemia: Secondary | ICD-10-CM

## 2018-10-20 MED ORDER — ALLOPURINOL 300 MG PO TABS: ORAL_TABLET | ORAL | 1 refills | Status: DC

## 2018-10-20 MED ORDER — GLUCOSE BLOOD VI STRP: 1.0000 | ORAL_STRIP | Freq: Every day | 11 refills | Status: DC

## 2018-10-20 MED ORDER — GABAPENTIN 300 MG PO CAPS: 300.0000 mg | ORAL_CAPSULE | Freq: Two times a day (BID) | ORAL | 1 refills | Status: DC

## 2018-10-20 MED ORDER — FREESTYLE SYSTEM KIT: 1.0000 | PACK | 1 refills | Status: DC | PRN

## 2018-10-20 MED ORDER — NEOMYCIN-POLYMYXIN-HC 3.5-10000-1 OT SOLN: 3.0000 [drp] | Freq: Four times a day (QID) | OTIC | 0 refills | Status: AC

## 2018-10-20 MED ORDER — INSULIN PEN NEEDLE 29G X 12.7MM MISC: 3 refills | Status: DC

## 2018-10-20 NOTE — Progress Notes (Signed)
BP 138/83   Pulse 62   Temp 97.9 F (36.6 C) (Oral)   Ht 5' 6"  (1.676 m)   Wt 234 lb (106.1 kg)   BMI 37.77 kg/m    Subjective:    Patient ID: Manuel Kline, male    DOB: 1932-02-23, 85 y.o.   MRN: [id number]  HPI: Manuel Kline is a 85 y.o. male presenting on 10/20/2018 for Medical Management of Chronic Issues   HPI Type 2 diabetes mellitus Patient comes in today for recheck of his diabetes. Patient has been currently taking NPH insulin 35 units twice daily and Humulin R 30 units twice daily which although he does admit that he has not always been taking the evening doses. Patient is currently on an ACE inhibitor/ARB. Patient has not seen an ophthalmologist this year.  Patient has known neuropathy from diabetes and stage IV CKD.   Hyperlipidemia Patient is coming in for recheck of his hyperlipidemia. The patient is currently taking Lipitor. They deny any issues with myalgias or history of liver damage from it. They deny any focal numbness or weakness or chest pain.   Hypertension Patient is currently on clonidine and quinapril and metoprolol, and their blood pressure today is 138/83. Patient denies any lightheadedness or dizziness. Patient denies headaches, blurred vision, chest pains, shortness of breath, or weakness. Denies any side effects from medication and is content with current medication.   Ear pain left side Comes in complaining of ear pain on his left side and drainage and congestion.  He denies any fevers or chills or shortness of breath or wheezing.  He denies any significant cough but mainly just the ear and he feels like there is drainage and pressure in it.  Relevant past medical, surgical, family and social history reviewed and updated as indicated. Interim medical history since our last visit reviewed. Allergies and medications reviewed and updated.  Review of Systems  Constitutional: Negative for chills and fever.  HENT: Positive for ear discharge and ear pain.  Negative for congestion, sinus pressure, sinus pain, sore throat and tinnitus.   Eyes: Negative for visual disturbance.  Respiratory: Negative for cough, shortness of breath and wheezing.   Cardiovascular: Negative for chest pain and leg swelling.  Musculoskeletal: Negative for back pain and gait problem.  Skin: Negative for rash.  Neurological: Positive for numbness. Negative for dizziness, weakness, light-headedness and headaches.  All other systems reviewed and are negative.   Per HPI unless specifically indicated above   Allergies as of 10/20/2018   No Known Allergies     Medication List       Accurate as of October 20, 2018  2:02 PM. Always use your most recent med list.        allopurinol 300 MG tablet Commonly known as:  ZYLOPRIM TAKE 1/2 (ONE-HALF) TABLET BY MOUTH ONCE DAILY   aspirin 81 MG tablet Take 81 mg by mouth daily.   atorvastatin 80 MG tablet Commonly known as:  LIPITOR Take 0.5 tablets (40 mg total) by mouth daily.   cloNIDine 0.1 MG tablet Commonly known as:  CATAPRES TAKE 1 TABLET BY MOUTH TWICE DAILY   Fish Oil 1000 MG Cpdr Take 1 tablet by mouth daily.   furosemide 40 MG tablet Commonly known as:  LASIX Take 1 tablet (40 mg total) by mouth 2 (two) times daily.   gabapentin 300 MG capsule Commonly known as:  NEURONTIN Take 1 capsule (300 mg total) by mouth 2 (two) times daily.  glucose blood test strip Commonly known as:  ACCU-CHEK GUIDE 1 each by Other route 5 (five) times daily. Use as instructed   glucose monitoring kit monitoring kit 1 each by Does not apply route as needed for other.   insulin NPH Human 100 UNIT/ML injection Commonly known as:  HUMULIN N INJECT 35 UNITS BID   Insulin Pen Needle 29G X 12.7MM Misc Use ti inject insulin 5 times a day   insulin regular 100 units/mL injection Commonly known as:  HUMULIN R Inject 0.3 mLs (30 Units total) into the skin 2 (two) times daily before a meal.   metoprolol succinate 50  MG 24 hr tablet Commonly known as:  TOPROL-XL Take 1 tablet (50 mg total) by mouth daily.   multivitamin tablet Take 1 tablet by mouth daily.   nitroGLYCERIN 0.4 MG SL tablet Commonly known as:  NITROSTAT Place 0.4 mg under the tongue every 5 (five) minutes as needed for chest pain.   polyethylene glycol powder powder Commonly known as:  GLYCOLAX/MIRALAX Take 17 g by mouth 2 (two) times daily as needed.   quinapril 40 MG tablet Commonly known as:  ACCUPRIL TAKE 1 TABLET BY MOUTH AT BEDTIME   VITAMIN B 12 PO Take 1,000 mcg by mouth daily.          Objective:    BP 138/83   Pulse 62   Temp 97.9 F (36.6 C) (Oral)   Ht 5' 6"  (1.676 m)   Wt 234 lb (106.1 kg)   BMI 37.77 kg/m   Wt Readings from Last 3 Encounters:  10/20/18 234 lb (106.1 kg)  09/29/18 234 lb 8 oz (106.4 kg)  07/19/18 237 lb 6.4 oz (107.7 kg)    Physical Exam Vitals signs and nursing note reviewed.  Constitutional:      General: He is not in acute distress.    Appearance: He is well-developed. He is not diaphoretic.  HENT:     Right Ear: Tympanic membrane normal. No drainage or swelling. No middle ear effusion. There is no impacted cerumen.     Left Ear: Tympanic membrane normal. Drainage and swelling present.  No middle ear effusion. There is no impacted cerumen.  Eyes:     General: No scleral icterus.    Conjunctiva/sclera: Conjunctivae normal.  Neck:     Musculoskeletal: Neck supple.     Thyroid: No thyromegaly.  Cardiovascular:     Rate and Rhythm: Normal rate and regular rhythm.     Heart sounds: Normal heart sounds. No murmur.  Pulmonary:     Effort: Pulmonary effort is normal. No respiratory distress.     Breath sounds: Normal breath sounds. No wheezing.  Musculoskeletal: Normal range of motion.  Lymphadenopathy:     Cervical: No cervical adenopathy.  Skin:    General: Skin is warm and dry.     Findings: No rash.  Neurological:     Mental Status: He is alert and oriented to person,  place, and time.     Coordination: Coordination normal.  Psychiatric:        Behavior: Behavior normal.         Assessment & Plan:   Problem List Items Addressed This Visit      Cardiovascular and Mediastinum   Essential hypertension   Relevant Orders   CMP14+EGFR     Endocrine   Type 2 diabetes, uncontrolled, with neuropathy ([place]) - Primary   Relevant Medications   glucose blood (ACCU-CHEK GUIDE) test strip   Other Relevant  Orders   CBC with Differential/Platelet   CMP14+EGFR   Bayer DCA Hb A1c Waived   Diabetic peripheral neuropathy (HCC)   Relevant Medications   gabapentin (NEURONTIN) 300 MG capsule     Genitourinary   CKD (chronic kidney disease) stage 4, GFR 15-29 ml/min (HCC)   Relevant Orders   CMP14+EGFR     Other   Hyperlipidemia (Chronic)   Relevant Orders   Lipid panel   Severe obesity (BMI >= 40) (HCC)    Other Visit Diagnoses    H/O: gout       Relevant Medications   allopurinol (ZYLOPRIM) 300 MG tablet   Neuropathy       Relevant Medications   gabapentin (NEURONTIN) 300 MG capsule   Acute swimmer's ear of left side       Relevant Medications   neomycin-polymyxin-hydrocortisone (CORTISPORIN) OTIC solution    Continue insulin to medication for diabetes and neuropathy as they seem to be doing well for him  Patient needs blood glucose meter   Follow up plan: Return in about 3 months (around 01/18/2019), or if symptoms worsen or fail to improve.  Counseling provided for all of the vaccine components Orders Placed This Encounter  Procedures  . CBC with Differential/Platelet  . CMP14+EGFR  . Bayer DCA Hb A1c Waived  . Lipid panel    Caryl Pina, MD [facility] 10/20/2018, 2:02 PM

## 2018-10-24 ENCOUNTER — Ambulatory Visit (INDEPENDENT_AMBULATORY_CARE_PROVIDER_SITE_OTHER): Payer: Medicare Other | Admitting: *Deleted

## 2018-10-24 DIAGNOSIS — I255 Ischemic cardiomyopathy: Secondary | ICD-10-CM

## 2018-10-25 LAB — CUP PACEART REMOTE DEVICE CHECK
Battery Remaining Longevity: 83 mo
Battery Voltage: 2.99 V
Brady Statistic AP VP Percent: 0.07 %
Brady Statistic AP VS Percent: 88.17 %
Brady Statistic AS VP Percent: 0.01 %
Brady Statistic AS VS Percent: 11.76 %
Brady Statistic RA Percent Paced: 88.22 %
Brady Statistic RV Percent Paced: 0.07 %
Date Time Interrogation Session: 20200225065159
HighPow Impedance: 53 Ohm
HighPow Impedance: 71 Ohm
Implantable Lead Implant Date: 20030519
Implantable Lead Implant Date: 20030519
Implantable Lead Location: 753859
Implantable Lead Location: 753860
Implantable Lead Model: 158
Implantable Lead Model: 4087
Implantable Lead Serial Number: 115102
Implantable Lead Serial Number: 159999
Implantable Pulse Generator Implant Date: 20171114
Lead Channel Impedance Value: 380 Ohm
Lead Channel Impedance Value: 399 Ohm
Lead Channel Impedance Value: 399 Ohm
Lead Channel Impedance Value: 4047 Ohm
Lead Channel Impedance Value: 4047 Ohm
Lead Channel Pacing Threshold Amplitude: 0.75 V
Lead Channel Pacing Threshold Amplitude: 1.375 V
Lead Channel Pacing Threshold Pulse Width: 0.4 ms
Lead Channel Sensing Intrinsic Amplitude: 15 mV
Lead Channel Sensing Intrinsic Amplitude: 15 mV
Lead Channel Sensing Intrinsic Amplitude: 2.125 mV
Lead Channel Sensing Intrinsic Amplitude: 2.125 mV
Lead Channel Setting Pacing Amplitude: 2.5 V
Lead Channel Setting Pacing Amplitude: 2.5 V
Lead Channel Setting Pacing Pulse Width: 0.4 ms
Lead Channel Setting Sensing Sensitivity: 0.3 mV
MDC IDC MSMT LEADCHNL LV IMPEDANCE VALUE: 4047 Ohm
MDC IDC MSMT LEADCHNL RA PACING THRESHOLD PULSEWIDTH: 0.4 ms

## 2018-11-01 ENCOUNTER — Encounter: Payer: Self-pay | Admitting: Cardiology

## 2018-11-01 NOTE — Progress Notes (Signed)
Remote ICD transmission.   

## 2018-11-10 ENCOUNTER — Ambulatory Visit: Payer: Medicare Other | Admitting: Adult Health

## 2018-11-18 DIAGNOSIS — G4733 Obstructive sleep apnea (adult) (pediatric): Secondary | ICD-10-CM | POA: Diagnosis not present

## 2018-11-21 ENCOUNTER — Other Ambulatory Visit: Payer: Self-pay

## 2018-11-21 NOTE — Patient Outreach (Signed)
[facility]) Care Management  11/21/2018   Manuel Kline [birth year] [id number]  Subjective: Successful call to the patient.  HIPAA verified.  The patient states that he is doing well.  He denies any pain, falls, swelling and shortness of breath.  The patient states that his FBS have been running in the 160's.  He states that he has been monitoring his diet.  He states that he has food in the home and he only leaves to get groceries.  Advised the patient to avoid being around sick people, 6 feet distancing, and hand hygiene.  The patient states that he does have a cough and runny nose but no fever.  Advised the patient to treat his symptoms but if he does run a fever of 100.4 to call his physician office. He verbalized understanding.  He states he has not weighed himself because he had his scale removed from the home.  Advised the patient to get a scale to keep check on his weight. He verbalized understanding.  The patient states that he will follow up with his physician for his diabetes is in May.  Current Medications:  Current Outpatient Medications  Medication Sig Dispense Refill  . allopurinol (ZYLOPRIM) 300 MG tablet TAKE 1/2 (ONE-HALF) TABLET BY MOUTH ONCE DAILY 45 tablet 1  . aspirin 81 MG tablet Take 81 mg by mouth daily.     Marland Kitchen atorvastatin (LIPITOR) 80 MG tablet Take 0.5 tablets (40 mg total) by mouth daily. 45 tablet 0  . cloNIDine (CATAPRES) 0.1 MG tablet TAKE 1 TABLET BY MOUTH TWICE DAILY 180 tablet 1  . Cyanocobalamin (VITAMIN B 12 PO) Take 1,000 mcg by mouth daily.    . furosemide (LASIX) 40 MG tablet Take 1 tablet (40 mg total) by mouth 2 (two) times daily. 180 tablet 0  . gabapentin (NEURONTIN) 300 MG capsule Take 1 capsule (300 mg total) by mouth 2 (two) times daily. 180 capsule 1  . glucose blood (ACCU-CHEK GUIDE) test strip 1 each by Other route 5 (five) times daily. Use as instructed 400 each 11  . glucose monitoring kit (FREESTYLE) monitoring kit 1 each by Does  not apply route as needed for other. 1 each 1  . insulin NPH Human (HUMULIN N) 100 UNIT/ML injection INJECT 35 UNITS BID 63 mL 3  . Insulin Pen Needle 29G X 12.7MM MISC Use ti inject insulin 5 times a day 150 each 3  . insulin regular (HUMULIN R) 100 units/mL injection Inject 0.3 mLs (30 Units total) into the skin 2 (two) times daily before a meal. 60 mL 3  . metoprolol succinate (TOPROL-XL) 50 MG 24 hr tablet Take 1 tablet (50 mg total) by mouth daily. 90 tablet 0  . Multiple Vitamin (MULTIVITAMIN) tablet Take 1 tablet by mouth daily.     . nitroGLYCERIN (NITROSTAT) 0.4 MG SL tablet Place 0.4 mg under the tongue every 5 (five) minutes as needed for chest pain.    . Omega-3 Fatty Acids (FISH OIL) 1000 MG CPDR Take 1 tablet by mouth daily.    . polyethylene glycol powder (GLYCOLAX/MIRALAX) powder Take 17 g by mouth 2 (two) times daily as needed. 3350 g 1  . quinapril (ACCUPRIL) 40 MG tablet TAKE 1 TABLET BY MOUTH AT BEDTIME 90 tablet 1   No current facility-administered medications for this visit.     Functional Status:  In your present state of health, do you have any difficulty performing the following activities: 03/23/2018 03/23/2018  Hearing? [name]  Vision? N N  Difficulty concentrating or making decisions? N -  Walking or climbing stairs? N -  Dressing or bathing? N -  Doing errands, shopping? N -  Preparing Food and eating ? N -  Using the Toilet? N -  In the past six months, have you accidently leaked urine? N -  Do you have problems with loss of bowel control? N -  Managing your Medications? N -  Managing your Finances? N -  Housekeeping or managing your Housekeeping? N -  Some recent data might be hidden    Fall/Depression Screening: Fall Risk  11/21/2018 10/20/2018 10/19/2018  Falls in the past year? 0 0 0  Number falls in past yr: - - -  Injury with Fall? - - -  Comment - - -  Risk Factor Category  - - -  Risk for fall due to : - - -  Risk for fall due to: Comment - - -   Follow up - - -  Comment - - -   PHQ 2/9 Scores 10/20/2018 07/19/2018 05/26/2018 05/13/2018 04/11/2018 03/23/2018 01/31/2018  PHQ - 2 Score 0 0 0 0 0 0 0  PHQ- 9 Score 0 - - - - - -    Assessment: Patient will continue to benefit from health coach outreach for disease management and support. [facility] CM Care Plan Problem One     Most Recent Value  [facility] Long Term Goal   In 90 days the patient will lower his a1c of 8.0 by 1-2 points  [facility] Long Term Goal Start Date  11/21/18  Interventions for Problem One Long Term Goal  Reviewed FBS readings,  Discussed diet and encouraged the patient to make sure they had supplie in te home, Encouraged the patiednt to weigh himself daily,, Advisd the patient to stay in the home if possible and to avoid anyone that is sick, hand washing with soap and water  or hand sanitizer, and if he find himself with any symptoms to call his physician and notify them.     Plan: RN Health Coach will contact patient in the month of June and patient agrees to next outreach.   [name] RN, BSN, [place] Direct Dial:  [phone]  Fax: [phone]

## 2018-12-12 ENCOUNTER — Telehealth: Payer: Self-pay | Admitting: Internal Medicine

## 2018-12-12 NOTE — Telephone Encounter (Signed)
New message   Patient called about visit on 04.14.20 with Dr. Lovena Le. Offered Mychart visit because pt is signed up. Pt states that he does not have a computer. Patient said that he would do a phone visit. He said to call on [phone]. Verbal consent obtained.

## 2018-12-13 ENCOUNTER — Other Ambulatory Visit: Payer: Self-pay

## 2018-12-13 ENCOUNTER — Telehealth (INDEPENDENT_AMBULATORY_CARE_PROVIDER_SITE_OTHER): Payer: Medicare Other | Admitting: Internal Medicine

## 2018-12-13 DIAGNOSIS — G4733 Obstructive sleep apnea (adult) (pediatric): Secondary | ICD-10-CM | POA: Diagnosis not present

## 2018-12-13 DIAGNOSIS — I255 Ischemic cardiomyopathy: Secondary | ICD-10-CM | POA: Diagnosis not present

## 2018-12-13 DIAGNOSIS — Z9989 Dependence on other enabling machines and devices: Secondary | ICD-10-CM

## 2018-12-13 DIAGNOSIS — I5022 Chronic systolic (congestive) heart failure: Secondary | ICD-10-CM

## 2018-12-13 DIAGNOSIS — I472 Ventricular tachycardia: Secondary | ICD-10-CM | POA: Diagnosis not present

## 2018-12-13 DIAGNOSIS — I4729 Other ventricular tachycardia: Secondary | ICD-10-CM

## 2018-12-13 NOTE — Telephone Encounter (Signed)
Follow up  ° ° °Pt is returning call  ° ° °Please call back  °

## 2018-12-13 NOTE — Progress Notes (Signed)
Electrophysiology TeleHealth Note   Due to national recommendations of social distancing due to [place] 19, an audio telehealth visit is felt to be most appropriate for this patient at this time.  See MyChart message from today for the patient's consent to telehealth for [facility].   Date:  12/13/2018   ID:  Manuel Kline, DOB [birth year], MRN [id number]  Location: patient's home  Provider location: [address], [place]  Evaluation Performed: Follow-up visit  PCP:  Dettinger, Fransisca Kaufmann, MD  Cardiologist:  No primary care provider on file. [name] Cree Electrophysiologist:  Dr Lovena Le  Chief Complaint:  "My hips hurt when I walk".  History of Present Illness:    Manuel Kline is a 85 y.o. male who presents via audio/video conference for a telehealth visit today. He is an 85 yo man with VT, chronic systolic heart failure, s/p ICD insertion and HTN.  Since last being seen in our clinic, the patient reports difficulty walking due to his hips. He can walk about 150 feet then has to stop.Today, he denies symptoms of palpitations, chest pain, shortness of breath,  lower extremity edema, dizziness, presyncope. His EF has improved by echo to 45%. The patient is otherwise without complaint today.  The patient denies symptoms of fevers, chills, cough, or new SOB worrisome for COVID 19.  Past Medical History:  Diagnosis Date  . Adenomatous colon polyp 2006  . CAD (coronary artery disease)   . Calcium oxalate renal stones   . Cardiomyopathy   . Cataract   . Diabetes ([place])   . Erectile dysfunction   . Erosive esophagitis   . Hemorrhoids   . HLD (hyperlipidemia)   . HTN (hypertension)   . Hypertensive heart disease without CHF 07/31/2011  . ICD (implantable cardiac defibrillator) in place   . ICD dual chamber in situ   . Metabolic syndrome   . Morbid obesity ([place])   . Osteoarthritis   . S/P CABG (coronary artery bypass graft) 11/02/2000  . Sleep apnea     Past Surgical  History:  Procedure Laterality Date  . ABDOMINAL EXPLORATION SURGERY    . BACK SURGERY     X'3  . cardiac bypass    . CARDIAC DEFIBRILLATOR PLACEMENT    . CARPAL TUNNEL RELEASE     X2, bilateral  . CATARACT EXTRACTION    . COLONOSCOPY  06/20/2012   Procedure: COLONOSCOPY;  Surgeon: Sable Feil, MD;  Location: [facility] END[facility];  Service: Endoscopy;  Laterality: N/A;  . DOPPLER ECHOCARDIOGRAPHY  2003  . EP IMPLANTABLE DEVICE N/A 07/14/2016   Procedure: ICD Generator Changeout;  Surgeon: Evans Lance, MD;  Location: [facility];  Service: Cardiovascular;  Laterality: N/A;  . ESOPHAGOGASTRODUODENOSCOPY  06/20/2012   Procedure: ESOPHAGOGASTRODUODENOSCOPY (EGD);  Surgeon: Sable Feil, MD;  Location: [facility];  Service: Endoscopy;  Laterality: N/A;  . EYE SURGERY    . LAPAROTOMY    . RETINOPATHY SURGERY Bilateral   . rotator cuff surgery     left    Current Outpatient Medications  Medication Sig Dispense Refill  . allopurinol (ZYLOPRIM) 300 MG tablet TAKE 1/2 (ONE-HALF) TABLET BY MOUTH ONCE DAILY 45 tablet 1  . aspirin 81 MG tablet Take 81 mg by mouth daily.     Marland Kitchen atorvastatin (LIPITOR) 80 MG tablet Take 0.5 tablets (40 mg total) by mouth daily. 45 tablet 0  . cloNIDine (CATAPRES) 0.1 MG tablet TAKE 1 TABLET BY MOUTH TWICE DAILY 180  tablet 1  . Cyanocobalamin (VITAMIN B 12 PO) Take 1,000 mcg by mouth daily.    . furosemide (LASIX) 40 MG tablet Take 1 tablet (40 mg total) by mouth 2 (two) times daily. 180 tablet 0  . gabapentin (NEURONTIN) 300 MG capsule Take 1 capsule (300 mg total) by mouth 2 (two) times daily. 180 capsule 1  . glucose blood (ACCU-CHEK GUIDE) test strip 1 each by Other route 5 (five) times daily. Use as instructed 400 each 11  . glucose monitoring kit (FREESTYLE) monitoring kit 1 each by Does not apply route as needed for other. 1 each 1  . insulin NPH Human (HUMULIN N) 100 UNIT/ML injection INJECT 35 UNITS BID 63 mL 3  . Insulin Pen Needle 29G X  12.7MM MISC Use ti inject insulin 5 times a day 150 each 3  . insulin regular (HUMULIN R) 100 units/mL injection Inject 0.3 mLs (30 Units total) into the skin 2 (two) times daily before a meal. 60 mL 3  . metoprolol succinate (TOPROL-XL) 50 MG 24 hr tablet Take 1 tablet (50 mg total) by mouth daily. 90 tablet 0  . Multiple Vitamin (MULTIVITAMIN) tablet Take 1 tablet by mouth daily.     . nitroGLYCERIN (NITROSTAT) 0.4 MG SL tablet Place 0.4 mg under the tongue every 5 (five) minutes as needed for chest pain.    . Omega-3 Fatty Acids (FISH OIL) 1000 MG CPDR Take 1 tablet by mouth daily.    . polyethylene glycol powder (GLYCOLAX/MIRALAX) powder Take 17 g by mouth 2 (two) times daily as needed. 3350 g 1  . quinapril (ACCUPRIL) 40 MG tablet TAKE 1 TABLET BY MOUTH AT BEDTIME 90 tablet 1   No current facility-administered medications for this visit.     Allergies:   Patient has no known allergies.   Social History:  The patient  reports that he has never smoked. He has never used smokeless tobacco. He reports that he does not drink alcohol or use drugs.   Family History:  The patient's  family history includes Cancer in his brother; Diabetes in his brother, brother, brother, mother, sister, sister, sister, sister, and sister; Heart disease in his brother, father, and mother; Kidney disease in his sister; Throat cancer in his paternal uncle.   ROS:  Please see the history of present illness.   All other systems are personally reviewed and negative.    Exam:    Vital Signs:  None today   Labs/Other Tests and Data Reviewed:    Recent Labs: 07/19/2018: ALT 27; BUN 31; Creatinine, Ser 2.16; Potassium 4.6; Sodium 140   Wt Readings from Last 3 Encounters:  10/20/18 234 lb (106.1 kg)  09/29/18 234 lb 8 oz (106.4 kg)  07/19/18 237 lb 6.4 oz (107.7 kg)     Other studies personally reviewed:  Last device remote is reviewed from [facility] PDF dated 10/25/18 which reveals normal device function, no  arrhythmias   ASSESSMENT & PLAN:    1.  VT - he has had no additional VT. I have recommended he continue his current meds. 2. Chronic systolic heart failure - his EF has improved by echo. He denies sob.  3. Arthritis - he has severe hip pain and is unable to walk. He will be sent a handicap placcard. 4. COVID 19 screen The patient denies symptoms of COVID 19 at this time.  The importance of social distancing was discussed today.  Follow-up:  3 months Next remote: 5/20  Current medicines are  reviewed at length with the patient today.   The patient does not have concerns regarding his medicines.  The following changes were made today:  none  Labs/ tests ordered today include:  No orders of the defined types were placed in this encounter.    Patient Risk:  after full review of this patients clinical status, I feel that they are at moderate risk at this time.  Today, I have spent 15 minutes with the patient with telehealth technology discussing the above.    Signed, Cristopher Peru, MD  12/13/2018 1:11 PM     [place] [address] [phone] (office) [phone] (fax)

## 2018-12-25 ENCOUNTER — Emergency Department (HOSPITAL_COMMUNITY): Payer: Medicare Other

## 2018-12-25 ENCOUNTER — Inpatient Hospital Stay (HOSPITAL_COMMUNITY)
Admission: EM | Admit: 2018-12-25 | Discharge: 2018-12-27 | DRG: 069 | Disposition: A | Discharge status: Home Health | Payer: Medicare Other | Attending: Internal Medicine | Admitting: Internal Medicine

## 2018-12-25 ENCOUNTER — Other Ambulatory Visit: Payer: Self-pay

## 2018-12-25 ENCOUNTER — Encounter (HOSPITAL_COMMUNITY): Payer: Self-pay

## 2018-12-25 DIAGNOSIS — Z8249 Family history of ischemic heart disease and other diseases of the circulatory system: Secondary | ICD-10-CM

## 2018-12-25 DIAGNOSIS — R4702 Dysphasia: Secondary | ICD-10-CM | POA: Diagnosis present

## 2018-12-25 DIAGNOSIS — Z79899 Other long term (current) drug therapy: Secondary | ICD-10-CM

## 2018-12-25 DIAGNOSIS — Z95810 Presence of automatic (implantable) cardiac defibrillator: Secondary | ICD-10-CM

## 2018-12-25 DIAGNOSIS — G459 Transient cerebral ischemic attack, unspecified: Principal | ICD-10-CM

## 2018-12-25 DIAGNOSIS — R079 Chest pain, unspecified: Secondary | ICD-10-CM

## 2018-12-25 DIAGNOSIS — R29810 Facial weakness: Secondary | ICD-10-CM | POA: Diagnosis present

## 2018-12-25 DIAGNOSIS — N183 Chronic kidney disease, stage 3 (moderate): Secondary | ICD-10-CM | POA: Diagnosis not present

## 2018-12-25 DIAGNOSIS — R7989 Other specified abnormal findings of blood chemistry: Secondary | ICD-10-CM

## 2018-12-25 DIAGNOSIS — I6381 Other cerebral infarction due to occlusion or stenosis of small artery: Secondary | ICD-10-CM | POA: Diagnosis not present

## 2018-12-25 DIAGNOSIS — R29818 Other symptoms and signs involving the nervous system: Secondary | ICD-10-CM | POA: Diagnosis not present

## 2018-12-25 DIAGNOSIS — R402142 Coma scale, eyes open, spontaneous, at arrival to emergency department: Secondary | ICD-10-CM | POA: Diagnosis not present

## 2018-12-25 DIAGNOSIS — R778 Other specified abnormalities of plasma proteins: Secondary | ICD-10-CM | POA: Diagnosis not present

## 2018-12-25 DIAGNOSIS — G4733 Obstructive sleep apnea (adult) (pediatric): Secondary | ICD-10-CM | POA: Diagnosis not present

## 2018-12-25 DIAGNOSIS — I2510 Atherosclerotic heart disease of native coronary artery without angina pectoris: Secondary | ICD-10-CM

## 2018-12-25 DIAGNOSIS — E1165 Type 2 diabetes mellitus with hyperglycemia: Secondary | ICD-10-CM

## 2018-12-25 DIAGNOSIS — E785 Hyperlipidemia, unspecified: Secondary | ICD-10-CM | POA: Diagnosis present

## 2018-12-25 DIAGNOSIS — Z6841 Body Mass Index (BMI) 40.0 and over, adult: Secondary | ICD-10-CM

## 2018-12-25 DIAGNOSIS — E8881 Metabolic syndrome: Secondary | ICD-10-CM | POA: Diagnosis present

## 2018-12-25 DIAGNOSIS — K8020 Calculus of gallbladder without cholecystitis without obstruction: Secondary | ICD-10-CM | POA: Diagnosis not present

## 2018-12-25 DIAGNOSIS — R2990 Unspecified symptoms and signs involving the nervous system: Secondary | ICD-10-CM | POA: Diagnosis not present

## 2018-12-25 DIAGNOSIS — N184 Chronic kidney disease, stage 4 (severe): Secondary | ICD-10-CM | POA: Diagnosis present

## 2018-12-25 DIAGNOSIS — M109 Gout, unspecified: Secondary | ICD-10-CM | POA: Diagnosis present

## 2018-12-25 DIAGNOSIS — I429 Cardiomyopathy, unspecified: Secondary | ICD-10-CM | POA: Diagnosis present

## 2018-12-25 DIAGNOSIS — R402252 Coma scale, best verbal response, oriented, at arrival to emergency department: Secondary | ICD-10-CM | POA: Diagnosis present

## 2018-12-25 DIAGNOSIS — R4781 Slurred speech: Secondary | ICD-10-CM | POA: Diagnosis present

## 2018-12-25 DIAGNOSIS — Z833 Family history of diabetes mellitus: Secondary | ICD-10-CM

## 2018-12-25 DIAGNOSIS — E11319 Type 2 diabetes mellitus with unspecified diabetic retinopathy without macular edema: Secondary | ICD-10-CM | POA: Diagnosis not present

## 2018-12-25 DIAGNOSIS — R531 Weakness: Secondary | ICD-10-CM

## 2018-12-25 DIAGNOSIS — I69354 Hemiplegia and hemiparesis following cerebral infarction affecting left non-dominant side: Secondary | ICD-10-CM | POA: Diagnosis not present

## 2018-12-25 DIAGNOSIS — Z794 Long term (current) use of insulin: Secondary | ICD-10-CM

## 2018-12-25 DIAGNOSIS — E1122 Type 2 diabetes mellitus with diabetic chronic kidney disease: Secondary | ICD-10-CM | POA: Diagnosis present

## 2018-12-25 DIAGNOSIS — I447 Left bundle-branch block, unspecified: Secondary | ICD-10-CM | POA: Diagnosis present

## 2018-12-25 DIAGNOSIS — I6340 Cerebral infarction due to embolism of unspecified cerebral artery: Secondary | ICD-10-CM

## 2018-12-25 DIAGNOSIS — E1169 Type 2 diabetes mellitus with other specified complication: Secondary | ICD-10-CM | POA: Diagnosis present

## 2018-12-25 DIAGNOSIS — I6310 Cerebral infarction due to embolism of unspecified precerebral artery: Secondary | ICD-10-CM | POA: Diagnosis not present

## 2018-12-25 DIAGNOSIS — E1140 Type 2 diabetes mellitus with diabetic neuropathy, unspecified: Secondary | ICD-10-CM | POA: Diagnosis not present

## 2018-12-25 DIAGNOSIS — I5022 Chronic systolic (congestive) heart failure: Secondary | ICD-10-CM | POA: Diagnosis not present

## 2018-12-25 DIAGNOSIS — Z951 Presence of aortocoronary bypass graft: Secondary | ICD-10-CM

## 2018-12-25 DIAGNOSIS — IMO0002 Reserved for concepts with insufficient information to code with codable children: Secondary | ICD-10-CM

## 2018-12-25 DIAGNOSIS — R402362 Coma scale, best motor response, obeys commands, at arrival to emergency department: Secondary | ICD-10-CM | POA: Diagnosis present

## 2018-12-25 DIAGNOSIS — E162 Hypoglycemia, unspecified: Secondary | ICD-10-CM | POA: Diagnosis not present

## 2018-12-25 DIAGNOSIS — Z86010 Personal history of colonic polyps: Secondary | ICD-10-CM

## 2018-12-25 DIAGNOSIS — E1151 Type 2 diabetes mellitus with diabetic peripheral angiopathy without gangrene: Secondary | ICD-10-CM | POA: Diagnosis present

## 2018-12-25 DIAGNOSIS — I130 Hypertensive heart and chronic kidney disease with heart failure and stage 1 through stage 4 chronic kidney disease, or unspecified chronic kidney disease: Secondary | ICD-10-CM | POA: Diagnosis not present

## 2018-12-25 DIAGNOSIS — E161 Other hypoglycemia: Secondary | ICD-10-CM | POA: Diagnosis not present

## 2018-12-25 DIAGNOSIS — R29702 NIHSS score 2: Secondary | ICD-10-CM | POA: Diagnosis present

## 2018-12-25 DIAGNOSIS — J9811 Atelectasis: Secondary | ICD-10-CM | POA: Diagnosis not present

## 2018-12-25 DIAGNOSIS — Z7982 Long term (current) use of aspirin: Secondary | ICD-10-CM

## 2018-12-25 DIAGNOSIS — R55 Syncope and collapse: Secondary | ICD-10-CM | POA: Diagnosis not present

## 2018-12-25 DIAGNOSIS — N529 Male erectile dysfunction, unspecified: Secondary | ICD-10-CM | POA: Diagnosis present

## 2018-12-25 HISTORY — DX: Weakness: R53.1

## 2018-12-25 LAB — CBC
HCT: 40.4 % (ref 39.0–52.0)
Hemoglobin: 13.6 g/dL (ref 13.0–17.0)
MCH: 30.7 pg (ref 26.0–34.0)
MCHC: 33.7 g/dL (ref 30.0–36.0)
MCV: 91.2 fL (ref 80.0–100.0)
Platelets: 189 10*3/uL (ref 150–400)
RBC: 4.43 MIL/uL (ref 4.22–5.81)
RDW: 14.1 % (ref 11.5–15.5)
WBC: 8.6 10*3/uL (ref 4.0–10.5)
nRBC: 0 % (ref 0.0–0.2)

## 2018-12-25 LAB — URINALYSIS, ROUTINE W REFLEX MICROSCOPIC
Bilirubin Urine: NEGATIVE
Glucose, UA: NEGATIVE mg/dL
Hgb urine dipstick: NEGATIVE
Ketones, ur: NEGATIVE mg/dL
Leukocytes,Ua: NEGATIVE
Nitrite: NEGATIVE
Protein, ur: NEGATIVE mg/dL
Specific Gravity, Urine: 1.01 (ref 1.005–1.030)
pH: 7 (ref 5.0–8.0)

## 2018-12-25 LAB — TROPONIN I
Troponin I: 3.05 ng/mL (ref <0.03)
Troponin I: 3.18 ng/mL (ref <0.03)
Troponin I: 3.78 ng/mL (ref <0.03)

## 2018-12-25 LAB — COMPREHENSIVE METABOLIC PANEL
ALT: 27 U/L (ref 0–44)
AST: 36 U/L (ref 15–41)
Albumin: 3.8 g/dL (ref 3.5–5.0)
Alkaline Phosphatase: 92 U/L (ref 38–126)
Anion gap: 10 (ref 5–15)
BUN: 26 mg/dL — ABNORMAL HIGH (ref 8–23)
CO2: 25 mmol/L (ref 22–32)
Calcium: 9.2 mg/dL (ref 8.9–10.3)
Chloride: 103 mmol/L (ref 98–111)
Creatinine, Ser: 2.04 mg/dL — ABNORMAL HIGH (ref 0.61–1.24)
GFR calc Af Amer: 34 mL/min — ABNORMAL LOW (ref >60)
GFR calc non Af Amer: 29 mL/min — ABNORMAL LOW (ref >60)
Glucose, Bld: 148 mg/dL — ABNORMAL HIGH (ref 70–99)
Potassium: 4 mmol/L (ref 3.5–5.1)
Sodium: 138 mmol/L (ref 135–145)
Total Bilirubin: 0.8 mg/dL (ref 0.3–1.2)
Total Protein: 7.3 g/dL (ref 6.5–8.1)

## 2018-12-25 LAB — CK TOTAL AND CKMB (NOT AT ARMC)
CK, MB: 6.4 ng/mL — ABNORMAL HIGH (ref 0.5–5.0)
Relative Index: 2.1 (ref 0.0–2.5)
Total CK: 304 U/L (ref 49–397)

## 2018-12-25 LAB — TSH: TSH: 1.421 u[IU]/mL (ref 0.350–4.500)

## 2018-12-25 LAB — VITAMIN B12: Vitamin B-12: 556 pg/mL (ref 180–914)

## 2018-12-25 LAB — GLUCOSE, CAPILLARY: Glucose-Capillary: 95 mg/dL (ref 70–99)

## 2018-12-25 MED ORDER — SODIUM CHLORIDE 0.9 % IV BOLUS
1000.0000 mL | Freq: Once | INTRAVENOUS | Status: AC
  Administered 2018-12-25: 16:00:00 1000 mL via INTRAVENOUS

## 2018-12-25 MED ORDER — FUROSEMIDE 40 MG PO TABS
40.0000 mg | ORAL_TABLET | Freq: Two times a day (BID) | ORAL | Status: DC
  Administered 2018-12-26 – 2018-12-27 (×3): 40 mg via ORAL
  Filled 2018-12-25 (×3): qty 1

## 2018-12-25 MED ORDER — ATORVASTATIN CALCIUM 40 MG PO TABS
40.0000 mg | ORAL_TABLET | Freq: Every day | ORAL | Status: DC
  Administered 2018-12-26 – 2018-12-27 (×2): 40 mg via ORAL
  Filled 2018-12-25 (×2): qty 1

## 2018-12-25 MED ORDER — STROKE: EARLY STAGES OF RECOVERY BOOK
Freq: Once | Status: AC
  Administered 2018-12-25: 22:00:00

## 2018-12-25 MED ORDER — ALLOPURINOL 100 MG PO TABS
150.0000 mg | ORAL_TABLET | Freq: Every day | ORAL | Status: DC
  Administered 2018-12-26 – 2018-12-27 (×2): 150 mg via ORAL
  Filled 2018-12-25 (×2): qty 2

## 2018-12-25 MED ORDER — ACETAMINOPHEN 160 MG/5ML PO SOLN: 650.0000 mg | ORAL | Status: DC | PRN

## 2018-12-25 MED ORDER — ACETAMINOPHEN 650 MG RE SUPP: 650.0000 mg | RECTAL | Status: DC | PRN

## 2018-12-25 MED ORDER — INSULIN ASPART 100 UNIT/ML ~~LOC~~ SOLN: 0.0000 [IU] | Freq: Every day | SUBCUTANEOUS | Status: DC

## 2018-12-25 MED ORDER — LABETALOL HCL 5 MG/ML IV SOLN
10.0000 mg | Freq: Once | INTRAVENOUS | Status: AC
  Administered 2018-12-25: 10 mg via INTRAVENOUS
  Filled 2018-12-25: qty 4

## 2018-12-25 MED ORDER — INSULIN ASPART 100 UNIT/ML ~~LOC~~ SOLN
0.0000 [IU] | Freq: Three times a day (TID) | SUBCUTANEOUS | Status: DC
  Administered 2018-12-26: 18:00:00 2 [IU] via SUBCUTANEOUS
  Administered 2018-12-26: 3 [IU] via SUBCUTANEOUS
  Administered 2018-12-26: 08:00:00 2 [IU] via SUBCUTANEOUS
  Administered 2018-12-27: 06:00:00 1 [IU] via SUBCUTANEOUS
  Administered 2018-12-27 (×2): 2 [IU] via SUBCUTANEOUS

## 2018-12-25 MED ORDER — METOPROLOL SUCCINATE ER 25 MG PO TB24
50.0000 mg | ORAL_TABLET | Freq: Every day | ORAL | Status: DC
  Administered 2018-12-26 – 2018-12-27 (×2): 50 mg via ORAL
  Filled 2018-12-25 (×3): qty 2

## 2018-12-25 MED ORDER — IOHEXOL 300 MG/ML  SOLN
100.0000 mL | Freq: Once | INTRAMUSCULAR | Status: AC | PRN
  Administered 2018-12-25: 17:00:00 80 mL via INTRAVENOUS

## 2018-12-25 MED ORDER — POLYETHYLENE GLYCOL 3350 17 G PO PACK: 17.0000 g | PACK | Freq: Two times a day (BID) | ORAL | Status: DC | PRN

## 2018-12-25 MED ORDER — ACETAMINOPHEN 325 MG PO TABS: 650.0000 mg | ORAL_TABLET | ORAL | Status: DC | PRN

## 2018-12-25 MED ORDER — INSULIN ASPART 100 UNIT/ML ~~LOC~~ SOLN
15.0000 [IU] | Freq: Two times a day (BID) | SUBCUTANEOUS | Status: DC
  Administered 2018-12-26 – 2018-12-27 (×4): 15 [IU] via SUBCUTANEOUS

## 2018-12-25 MED ORDER — ASPIRIN 300 MG RE SUPP: 300.0000 mg | Freq: Every day | RECTAL | Status: DC

## 2018-12-25 MED ORDER — ASPIRIN 325 MG PO TABS
325.0000 mg | ORAL_TABLET | Freq: Every day | ORAL | Status: DC
  Administered 2018-12-25 – 2018-12-26 (×2): 325 mg via ORAL
  Filled 2018-12-25 (×2): qty 1

## 2018-12-25 MED ORDER — GABAPENTIN 300 MG PO CAPS
300.0000 mg | ORAL_CAPSULE | Freq: Two times a day (BID) | ORAL | Status: DC
  Administered 2018-12-25 – 2018-12-27 (×4): 300 mg via ORAL
  Filled 2018-12-25 (×4): qty 1

## 2018-12-25 MED ORDER — INSULIN NPH (HUMAN) (ISOPHANE) 100 UNIT/ML ~~LOC~~ SUSP
20.0000 [IU] | Freq: Two times a day (BID) | SUBCUTANEOUS | Status: DC
  Administered 2018-12-26 – 2018-12-27 (×4): 20 [IU] via SUBCUTANEOUS
  Filled 2018-12-25: qty 10

## 2018-12-25 MED ORDER — HYDRALAZINE HCL 10 MG PO TABS
10.0000 mg | ORAL_TABLET | Freq: Three times a day (TID) | ORAL | Status: DC
  Administered 2018-12-25 – 2018-12-27 (×6): 10 mg via ORAL
  Filled 2018-12-25 (×6): qty 1

## 2018-12-25 NOTE — ED Triage Notes (Signed)
Pt arrives with daughter-in-law POV from [place] c/o altered mental status, and possible stroke. LKW was 1224. Pt is a& x4. Daughter-in-law states her husband called her and stated the pt "was not himself", not oriented, but did not lose consciousness. DIL states pt had "facial droop, slurred speech, and left sided weakness". Pt is resting comfortably but was unable to remember what happened.

## 2018-12-25 NOTE — ED Provider Notes (Signed)
[facility] Emergency Department Provider Note MRN:  [id number]  Arrival date & time: 12/25/18     Chief Complaint   Altered Mental Status and possible stroke   History of Present Illness   Manuel Kline is a 85 y.o. year-old male with a history of CAD, hypertension presenting to the ED with chief complaint of altered mental status.  Patient was at church when at about 12:30 PM began acting abnormal.  Was unable to speak, family noted left-sided facial droop, left arm was numb.  Symptoms lasted 10 minutes and then resolved.  Patient is currently back to normal, denying headache, no vision change, no chest pain, no shortness of breath, no abdominal pain, no numbness weakness to the arms or legs, no trouble swallowing.  No recent fever or cough, no recent dysuria.  Review of Systems  A complete 10 system review of systems was obtained and all systems are negative except as noted in the HPI and PMH.   Patient's Health History    Past Medical History:  Diagnosis Date  . Adenomatous colon polyp 2006  . CAD (coronary artery disease)   . Calcium oxalate renal stones   . Cardiomyopathy   . Cataract   . Diabetes ([place])   . Erectile dysfunction   . Erosive esophagitis   . Hemorrhoids   . HLD (hyperlipidemia)   . HTN (hypertension)   . Hypertensive heart disease without CHF 07/31/2011  . ICD (implantable cardiac defibrillator) in place   . ICD dual chamber in situ   . Metabolic syndrome   . Morbid obesity ([place])   . Osteoarthritis   . S/P CABG (coronary artery bypass graft) 11/02/2000  . Sleep apnea     Past Surgical History:  Procedure Laterality Date  . ABDOMINAL EXPLORATION SURGERY    . BACK SURGERY     X'3  . cardiac bypass    . CARDIAC DEFIBRILLATOR PLACEMENT    . CARPAL TUNNEL RELEASE     X2, bilateral  . CATARACT EXTRACTION    . COLONOSCOPY  06/20/2012   Procedure: COLONOSCOPY;  Surgeon: Sable Feil, MD;  Location: [facility];  Service: Endoscopy;   Laterality: N/A;  . DOPPLER ECHOCARDIOGRAPHY  2003  . EP IMPLANTABLE DEVICE N/A 07/14/2016   Procedure: ICD Generator Changeout;  Surgeon: Evans Lance, MD;  Location: [facility];  Service: Cardiovascular;  Laterality: N/A;  . ESOPHAGOGASTRODUODENOSCOPY  06/20/2012   Procedure: ESOPHAGOGASTRODUODENOSCOPY (EGD);  Surgeon: Sable Feil, MD;  Location: [facility];  Service: Endoscopy;  Laterality: N/A;  . EYE SURGERY    . LAPAROTOMY    . RETINOPATHY SURGERY Bilateral   . rotator cuff surgery     left    Family History  Problem Relation Age of Onset  . Diabetes Brother   . Heart disease Father   . Heart disease Mother   . Diabetes Mother   . Diabetes Brother   . Diabetes Brother   . Cancer Brother        baldder  . Heart disease Brother   . Diabetes Sister   . Diabetes Sister   . Kidney disease Sister        dialysis  . Diabetes Sister   . Diabetes Sister   . Diabetes Sister   . Throat cancer Paternal Uncle     Social History   Socioeconomic History  . Marital status: Married    Spouse name: Not on file  . Number of children: 4  .  Years of education: 8  . Highest education level: 11th grade  Occupational History  . Occupation: Retired from [employer]: RETIRED  Social Needs  . Financial resource strain: Not hard at all  . Food insecurity:    Worry: Never true    Inability: Never true  . Transportation needs:    Medical: No    Non-medical: No  Tobacco Use  . Smoking status: Never Smoker  . Smokeless tobacco: Never Used  Substance and Sexual Activity  . Alcohol use: No    Alcohol/week: 0.0 standard drinks  . Drug use: No  . Sexual activity: Yes  Lifestyle  . Physical activity:    Days per week: 0 days    Minutes per session: 0 min  . Stress: Not at all  Relationships  . Social connections:    Talks on phone: More than three times a week    Gets together: More than three times a week    Attends religious service: More  than 4 times per year    Active member of club or organization: Yes    Attends meetings of clubs or organizations: More than 4 times per year    Relationship status: Married  . Intimate partner violence:    Fear of current or ex partner: No    Emotionally abused: No    Physically abused: No    Forced sexual activity: No  Other Topics Concern  . Not on file  Social History Narrative   Right handed, Married, 4 kids from previous marriage.  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 Caffeine 1 cup daily.     Physical Exam  Vital Signs and Nursing Notes reviewed Vitals:   12/25/18 1530 12/25/18 1615  BP: (!) 149/77 (!) 177/84  Pulse: (!) 59 (!) 59  Resp: 13 14  Temp:    SpO2: 99% 100%    CONSTITUTIONAL: Well-appearing, NAD NEURO:  Alert and oriented x 3, normal and symmetric strength and sensation, no aphasia, no neglect, no visual field cuts EYES:  eyes equal and reactive ENT/NECK:  no LAD, no JVD CARDIO: Regular rate, well-perfused, normal S1 and S2 PULM:  CTAB no wheezing or rhonchi GI/GU:  normal bowel sounds, non-distended, non-tender MSK/SPINE:  No gross deformities, no edema SKIN:  no rash, atraumatic PSYCH:  Appropriate speech and behavior  Diagnostic and Interventional Summary    EKG Interpretation  Date/Time:  Sunday December 25 2018 14:43:40 EDT Ventricular Rate:  63 PR Interval:    QRS Duration: 165 QT Interval:  512 QTC Calculation: 525 R Axis:   -17 Text Interpretation:  Junctional rhythm Left bundle branch block Confirmed by Gerlene Fee [phone]) on 12/25/2018 3:18:28 PM      Labs Reviewed  COMPREHENSIVE METABOLIC PANEL - Abnormal; Notable for the following components:      Result Value   Glucose, Bld 148 (*)    BUN 26 (*)    Creatinine, Ser 2.04 (*)    GFR calc non Af Amer 29 (*)    GFR calc Af Amer 34 (*)    All other components within normal limits  TROPONIN I - Abnormal; Notable for the following components:   Troponin I 3.05 (*)    All other components within normal  limits  CBC  URINALYSIS, ROUTINE W REFLEX MICROSCOPIC    CT HEAD WO CONTRAST  Final Result    DG Chest Port 1 View  Final Result    CT Angio Chest/Abd/Pel for Dissection W and/or Wo Contrast    (  Results Pending)    Medications  labetalol (NORMODYNE) injection 10 mg (has no administration in time range)  sodium chloride 0.9 % bolus 1,000 mL (1,000 mLs Intravenous New Bag/Given 12/25/18 1613)  iohexol (OMNIPAQUE) 300 MG/ML solution 100 mL (80 mLs Intravenous Contrast Given 12/25/18 1643)     Procedures Critical Care Critical Care Documentation Critical care time provided by me (excluding procedures): 35 minutes  Condition necessitating critical care: Concern for acute coronary syndrome versus aortic dissection  Components of critical care management: reviewing of prior records, laboratory and imaging interpretation, frequent re-examination and reassessment of vital signs, administration of IV labetalol, discussion with consulting services    ED Course and Medical Decision Making  I have reviewed the triage vital signs and the nursing notes.  Pertinent labs & imaging results that were available during my care of the patient were reviewed by me and considered in my medical decision making (see below for details).  Concern for TIA versus symptomatic hypoglycemia.  Per report and per patient's family, his blood sugar was measured at 76 by EMS after drinking a soda.  Awaiting labs, CT head, likely neurology consult.  Clinical Course as of Dec 25 1647  Sun Dec 25, 2018  1617 Troponin is 3.05, raising concern for aortic dissection given the transient neurological deficit with the question of chest pain occurring during this event.  Patient has CKD and is at his baseline creatinine of 2.0.  The benefits of CT imaging seem to outweigh the risks at this time.  Discussed this with family, patient, also received approval from Dr. Purcell Nails of radiology so long as family provides consent for the  CTA.  Will provide liter bolus for renal protection.   [MB]  1618 Discussed case with cardiology, who favors medical admission.  Happy to consult if needed.   [MB]  1619 Discussed with neurology, who recommends MRI, will hold on ordering this for now until the concern for dissection is managed.   [MB]    Clinical Course User Index [MB] Maudie Flakes, MD     Family has consented for CTA imaging, being fully aware of the risk of worsening kidney function and possible progression to dialysis.  Awaiting CTA results.  Patient is without symptoms currently, systolic blood pressure in the 70s will provide dose of labetalol IV.  Without aortic dissection or acute surgical concern, would move forward with hospitalist admission with cardiology following.  Signed out to Dr. Ashok Cordia at shift change.  Barth Kirks. Sedonia Small, [place] [email or web address]  Final Clinical Impressions(s) / ED Diagnoses     ICD-10-CM   1. Neurological deficit, transient R29.818   2. Chest pain R07.9 DG Chest [facility] 1 View    DG Chest Port 1 View  3. Elevated troponin R79.89     ED Discharge Orders    None         Maudie Flakes, MD 12/25/18 1649

## 2018-12-25 NOTE — Consult Note (Signed)
Cardiology Consultation:   Patient ID: Manuel Kline MRN: [id number]; DOB: 05/02/1932  Admit date: 12/25/2018 Date of Consult: 12/25/2018  Primary Care Provider: Dettinger, Fransisca Kaufmann, MD Primary Cardiologist: Candee Furbish, MD  Primary Electrophysiologist:  Cristopher Peru, MD    Patient Profile:   Manuel Kline is a 85 y.o. male with a hx of chronic systolic heart failure who is being seen today for the evaluation of elevated troponin at the request of Dr. Sedonia Small.  Altered mental status and possible stroke was the reason he came in today.  History of Present Illness:   Manuel Kline is an 85 year old male patient of Dr. [name] Cree as well as Dr. Crissie Sickles who was recently seen via telemedicine on 12/13/2018 with the chief complaint of my hips hurt when I walk.  He has a history of ventricular tachycardia, chronic systolic heart failure status post ICD as well as hypertension.  After about 150 feet test to stop.  Denied any edema or shortness of breath.  EF most recently was 45%.  In looking at his device, he had no additional VT, he was sent a handicap placard because of his arthritis.  Normal device function.  Prior patient of Dr. Thurman Coyer.  Has known coronary artery disease status post CABG.  Today, he comes in with altered mental status.  According to ER visit note he was at church when at about 12:30 PM began acting abnormal.  Could not speak left-sided facial droop left arm was numb lasted about 10-20 minutes then resolved.  CT scan of chest currently pending. CT of head with small vessel changes only. Chest x-ray unremarkable EKG-atrial pacing with left bundle branch block.  No change from prior EKG on 09/29/2018.  Troponin 3.05.  He returned to normal denying any symptoms.  No recent COVID-19 symptoms.  Overall he denies any chest pressure or anginal-like symptoms.  His caregiver is at bedside.  Story corroborates.    Past Medical History:  Diagnosis Date  . Adenomatous colon polyp  2006  . CAD (coronary artery disease)   . Calcium oxalate renal stones   . Cardiomyopathy   . Cataract   . Diabetes ([place])   . Erectile dysfunction   . Erosive esophagitis   . Hemorrhoids   . HLD (hyperlipidemia)   . HTN (hypertension)   . Hypertensive heart disease without CHF 07/31/2011  . ICD (implantable cardiac defibrillator) in place   . ICD dual chamber in situ   . Metabolic syndrome   . Morbid obesity ([place])   . Osteoarthritis   . S/P CABG (coronary artery bypass graft) 11/02/2000  . Sleep apnea     Past Surgical History:  Procedure Laterality Date  . ABDOMINAL EXPLORATION SURGERY    . BACK SURGERY     X'3  . cardiac bypass    . CARDIAC DEFIBRILLATOR PLACEMENT    . CARPAL TUNNEL RELEASE     X2, bilateral  . CATARACT EXTRACTION    . COLONOSCOPY  06/20/2012   Procedure: COLONOSCOPY;  Surgeon: Sable Feil, MD;  Location: [facility];  Service: Endoscopy;  Laterality: N/A;  . DOPPLER ECHOCARDIOGRAPHY  2003  . EP IMPLANTABLE DEVICE N/A 07/14/2016   Procedure: ICD Generator Changeout;  Surgeon: Evans Lance, MD;  Location: [facility];  Service: Cardiovascular;  Laterality: N/A;  . ESOPHAGOGASTRODUODENOSCOPY  06/20/2012   Procedure: ESOPHAGOGASTRODUODENOSCOPY (EGD);  Surgeon: Sable Feil, MD;  Location: [facility];  Service: Endoscopy;  Laterality: N/A;  . EYE SURGERY    .  LAPAROTOMY    . RETINOPATHY SURGERY Bilateral   . rotator cuff surgery     left     Home Medications:  Prior to Admission medications   Medication Sig Start Date End Date Taking? Authorizing Provider  allopurinol (ZYLOPRIM) 300 MG tablet TAKE 1/2 (ONE-HALF) TABLET BY MOUTH ONCE DAILY Patient taking differently: Take 150 mg by mouth daily.  10/20/18  Yes Dettinger, Fransisca Kaufmann, MD  aspirin 81 MG tablet Take 81 mg by mouth daily.    Yes [provider]  atorvastatin (LIPITOR) 80 MG tablet Take 0.5 tablets (40 mg total) by mouth daily. 10/17/18  Yes Park Liter, MD   cloNIDine (CATAPRES) 0.1 MG tablet TAKE 1 TABLET BY MOUTH TWICE DAILY Patient taking differently: Take 0.1 mg by mouth 2 (two) times daily.  10/20/18  Yes Park Liter, MD  Cyanocobalamin (VITAMIN B 12 PO) Take 1,000 mcg by mouth daily.   Yes [provider]  furosemide (LASIX) 40 MG tablet Take 1 tablet (40 mg total) by mouth 2 (two) times daily. 10/17/18  Yes Park Liter, MD  gabapentin (NEURONTIN) 300 MG capsule Take 1 capsule (300 mg total) by mouth 2 (two) times daily. 10/20/18  Yes Dettinger, Fransisca Kaufmann, MD  insulin NPH Human (HUMULIN N) 100 UNIT/ML injection INJECT 35 UNITS BID Patient taking differently: Inject 35 Units into the skin 2 (two) times daily before a meal.  09/13/18  Yes Eustaquio Maize, MD  insulin regular (HUMULIN R) 100 units/mL injection Inject 0.3 mLs (30 Units total) into the skin 2 (two) times daily before a meal. 07/19/18  Yes Eustaquio Maize, MD  metoprolol succinate (TOPROL-XL) 50 MG 24 hr tablet Take 1 tablet (50 mg total) by mouth daily. 10/17/18  Yes Park Liter, MD  nitroGLYCERIN (NITROSTAT) 0.4 MG SL tablet Place 0.4 mg under the tongue every 5 (five) minutes as needed for chest pain.   Yes [provider]  Omega-3 Fatty Acids (FISH OIL) 1000 MG CPDR Take 1 tablet by mouth daily.   Yes [provider]  polyethylene glycol powder (GLYCOLAX/MIRALAX) powder Take 17 g by mouth 2 (two) times daily as needed. 12/22/17  Yes Eustaquio Maize, MD  quinapril (ACCUPRIL) 40 MG tablet TAKE 1 TABLET BY MOUTH AT BEDTIME Patient taking differently: Take 40 mg by mouth daily.  10/20/18  Yes Park Liter, MD  glucose blood (ACCU-CHEK GUIDE) test strip 1 each by Other route 5 (five) times daily. Use as instructed 10/20/18   Dettinger, Fransisca Kaufmann, MD  glucose monitoring kit (FREESTYLE) monitoring kit 1 each by Does not apply route as needed for other. 10/20/18   Dettinger, Fransisca Kaufmann, MD  Insulin Pen Needle 29G X 12.7MM MISC Use ti inject  insulin 5 times a day 10/20/18   Dettinger, Fransisca Kaufmann, MD    Inpatient Medications: Scheduled Meds: . labetalol  10 mg Intravenous Once   Continuous Infusions:  PRN Meds:   Allergies:   No Known Allergies  Social History:   Social History   Socioeconomic History  . Marital status: Married    Spouse name: Not on file  . Number of children: 4  . Years of education: 55  . Highest education level: 11th grade  Occupational History  . Occupation: Retired from [employer]: RETIRED  Social Needs  . Financial resource strain: Not hard at all  . Food insecurity:    Worry: Never true    Inability: Never true  .  Transportation needs:    Medical: No    Non-medical: No  Tobacco Use  . Smoking status: Never Smoker  . Smokeless tobacco: Never Used  Substance and Sexual Activity  . Alcohol use: No    Alcohol/week: 0.0 standard drinks  . Drug use: No  . Sexual activity: Yes  Lifestyle  . Physical activity:    Days per week: 0 days    Minutes per session: 0 min  . Stress: Not at all  Relationships  . Social connections:    Talks on phone: More than three times a week    Gets together: More than three times a week    Attends religious service: More than 4 times per year    Active member of club or organization: Yes    Attends meetings of clubs or organizations: More than 4 times per year    Relationship status: Married  . Intimate partner violence:    Fear of current or ex partner: No    Emotionally abused: No    Physically abused: No    Forced sexual activity: No  Other Topics Concern  . Not on file  Social History Narrative   Right handed, Married, 4 kids from previous marriage.  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 Caffeine 1 cup daily.    Family History:    Family History  Problem Relation Age of Onset  . Diabetes Brother   . Heart disease Father   . Heart disease Mother   . Diabetes Mother   . Diabetes Brother   . Diabetes Brother   . Cancer Brother         baldder  . Heart disease Brother   . Diabetes Sister   . Diabetes Sister   . Kidney disease Sister        dialysis  . Diabetes Sister   . Diabetes Sister   . Diabetes Sister   . Throat cancer Paternal Uncle      ROS:  Please see the history of present illness.   All other ROS reviewed and negative.     Physical Exam/Data:   Vitals:   12/25/18 1445 12/25/18 1515 12/25/18 1530 12/25/18 1615  BP: (!) 156/72 (!) 148/79 (!) 149/77 (!) 177/84  Pulse: 64 60 (!) 59 (!) 59  Resp: 19 11 13 14   Temp:      TempSrc:      SpO2: 99% 99% 99% 100%  Weight:      Height:       No intake or output data in the 24 hours ending 12/25/18 1640 Last 3 Weights 12/25/2018 10/20/2018 09/29/2018  Weight (lbs) 233 lb 14.5 oz 234 lb 234 lb 8 oz  Weight (kg) 106.1 kg 106.142 kg 106.369 kg     Body mass index is 45.68 kg/m.  General:  Well nourished, well developed, in no acute distress HEENT: Hard of hearing Lymph: no adenopathy Neck: no JVD Endocrine:  No thryomegaly Vascular: No carotid bruits Cardiac:  normal S1, S2; RRR; no murmur, old CABG scar Lungs:  clear to auscultation bilaterally, no wheezing, rhonchi or rales  Abd: soft, nontender, no hepatomegaly  Ext: no edema Musculoskeletal:  No deformities, BUE and BLE strength normal and equal Skin: warm and dry  Neuro:  CNs 2-12 intact, no focal abnormalities noted Psych:  Normal affect   EKG:  The EKG was personally reviewed and demonstrates: A paced with left bundle branch block-no changes from prior Telemetry:  Telemetry was personally reviewed and demonstrates: No adverse arrhythmias  Relevant CV Studies:  Echocardiogram 10/13/2018:  1. The left ventricle has mildly reduced systolic function, with an ejection fraction of 45-50%. Left ventricular diastolic Doppler parameters are consistent with impaired relaxation.  2. The mitral valve is normal in structure. No evidence of mitral valve stenosis.  3. The tricuspid valve is normal in  structure.  4. The aortic valve is tricuspid There is mild thickening and mild calcification of the aortic valve.  5. The aortic root is normal in size and structure.  6. There is mild dilatation of the ascending aorta measuring 36 mm.  Laboratory Data:  Chemistry Recent Labs  Lab 12/25/18 1441  NA 138  K 4.0  CL 103  CO2 25  GLUCOSE 148*  BUN 26*  CREATININE 2.04*  CALCIUM 9.2  GFRNONAA 29*  GFRAA 34*  ANIONGAP 10    Recent Labs  Lab 12/25/18 1441  PROT 7.3  ALBUMIN 3.8  AST 36  ALT 27  ALKPHOS 92  BILITOT 0.8   Hematology Recent Labs  Lab 12/25/18 1441  WBC 8.6  RBC 4.43  HGB 13.6  HCT 40.4  MCV 91.2  MCH 30.7  MCHC 33.7  RDW 14.1  PLT 189   Cardiac Enzymes Recent Labs  Lab 12/25/18 1441  TROPONINI 3.05*   No results for input(s): TROPIPOC in the last 168 hours.  BNPNo results for input(s): BNP, PROBNP in the last 168 hours.  DDimer No results for input(s): DDIMER in the last 168 hours.  Radiology/Studies:  Ct Head Wo Contrast  Result Date: 12/25/2018 CLINICAL DATA:  Slurred speech, facial droop EXAM: CT HEAD WITHOUT CONTRAST TECHNIQUE: Contiguous axial images were obtained from the base of the skull through the vertex without intravenous contrast. COMPARISON:  Report 01/13/2002 FINDINGS: Brain: No acute territorial infarction, hemorrhage or intracranial mass. Moderate atrophy. Mild small vessel ischemic changes of the white matter. Chronic appearing lacunar infarct within the left basal ganglia and white matter. Mildly prominent ventricles, felt secondary to atrophy. Vascular: No hyperdense vessels.  Carotid vascular calcification Skull: Normal. Negative for fracture or focal lesion. Sinuses/Orbits: Mild mucosal thickening in the ethmoid sinuses. Other: None IMPRESSION: 1. No CT evidence for acute intracranial abnormality. 2. Atrophy and mild small vessel ischemic changes of the white matter Electronically Signed   By: Donavan Foil M.D.   On: 12/25/2018  15:29   Dg Chest Port 1 View  Result Date: 12/25/2018 CLINICAL DATA:  Chest pain.  History of sleep apnea and CABG. EXAM: PORTABLE CHEST 1 VIEW COMPARISON:  Radiographs 06/09/2016 and 08/29/2015. FINDINGS: 1434 hours. Left subclavian AICD leads appear unchanged. The heart size and mediastinal contours are stable post median sternotomy and CABG. There are lower lung volumes with mildly increased atelectasis at both lung bases. No edema, confluent airspace opacity, pleural effusion or pneumothorax. The bones appear unchanged. Telemetry leads overlie the chest. IMPRESSION: Slightly lower lung volumes with resulting mild bibasilar atelectasis. Otherwise stable postoperative chest without acute findings. Electronically Signed   By: Richardean Sale M.D.   On: 12/25/2018 15:13    Assessment and Plan:   Elevated troponin in the setting of apparent transient ischemic attack - He denies any chest pain, no angina.  He is post CABG.  Ultimately, his troponin may be elevated secondary to a transient neurologic abnormality.  This is occasionally seen in stroke.  Mechanism unknown.  Nonetheless, I think would be helpful to continue to trend his troponin values 2 more times. At this time, I would not start heparin.  Clinically, there was nothing consistent with myocardial infarction.  EKG unchanged with left bundle branch block. -Recommend checking echocardiogram.  He did just have one in January but if there is a drastic change it would be helpful to know. - I would not be opposed to changing him to clopidogrel 75 mg once a day instead of aspirin if neurology feels this is reasonable as well. - High intensity statin therapy, continue atorvastatin 40. -Neurologically seems sound at this point. -Awaiting results of CT angiogram of chest.  Chronic kidney disease stage III -Creatinine at baseline currently.  Hyperlipidemia -Last LDL 45.  Excellent continue atorvastatin 40.  Coronary artery disease -Status post  CABG -Secondary risk factor prevention -Most recent EF 45-50  ICD -Functioning properly.  Dr. Tanna Furry note reviewed.  We will continue to follow.      For questions or updates, please contact [place] Please consult [email or web address] for contact info under     Signed, Candee Furbish, MD  12/25/2018 4:40 PM

## 2018-12-25 NOTE — ED Notes (Signed)
Xray tech at bedside.

## 2018-12-25 NOTE — ED Provider Notes (Addendum)
Signed out by Dr Sedonia Small that he has already spoken with cardiology and neurology, and to admit to medicine service onset CTA resulted.  CTA resulted - no PE, no dissection.  Medicine team consulted for admission. Discussed pt with hospitalists - will admit.  Recheck pt, earlier symptoms remain resolved.  Family/patient updated on plan.       Lajean Saver, MD 12/25/18 [phone]

## 2018-12-25 NOTE — ED Notes (Signed)
ED TO INPATIENT HANDOFF REPORT  ED Nurse Name and Phone #: Percell Locus, RN  S Name/Age/Gender Manuel Kline 85 y.o. male Room/Bed: 030C/030C  Code Status   Code Status: Full Code  Home/SNF/Other Home Patient oriented to: self, place and time Is this baseline? Yes   Triage Complete: Triage complete  Chief Complaint AMS  Triage Note Pt arrives with daughter-in-law POV from [place] c/o altered mental status, and possible stroke. LKW was 1224. Pt is a& x4. Daughter-in-law states her husband called her and stated the pt "was not himself", not oriented, but did not lose consciousness. DIL states pt had "facial droop, slurred speech, and left sided weakness". Pt is resting comfortably but was unable to remember what happened.   Allergies No Known Allergies  Level of Care/Admitting Diagnosis ED Disposition    ED Disposition Condition Comment   Admit  Hospital Area: [facility] [[id number]]  Level of Care: Telemetry Cardiac [103]  I expect the patient will be discharged within 24 hours: No (not a candidate for 5C-Observation unit)  Covid Evaluation: N/A  Diagnosis: Left-sided weakness [161096]  Admitting Physician: Jani Gravel [[id number]]  Attending Physician: Jani Gravel [[id number]]  PT Class (Do Not Modify): Observation [104]  PT Acc Code (Do Not Modify): Observation [10022]       B Medical/Surgery History Past Medical History:  Diagnosis Date  . Adenomatous colon polyp 2006  . CAD (coronary artery disease)   . Calcium oxalate renal stones   . Cardiomyopathy   . Cataract   . Diabetes ([place])   . Erectile dysfunction   . Erosive esophagitis   . Hemorrhoids   . HLD (hyperlipidemia)   . HTN (hypertension)   . Hypertensive heart disease without CHF 07/31/2011  . ICD (implantable cardiac defibrillator) in place   . ICD dual chamber in situ   . Metabolic syndrome   . Morbid obesity ([place])   . Osteoarthritis   . S/P CABG (coronary artery bypass graft) 11/02/2000  .  Sleep apnea    Past Surgical History:  Procedure Laterality Date  . ABDOMINAL EXPLORATION SURGERY    . BACK SURGERY     X'3  . cardiac bypass    . CARDIAC DEFIBRILLATOR PLACEMENT    . CARPAL TUNNEL RELEASE     X2, bilateral  . CATARACT EXTRACTION    . COLONOSCOPY  06/20/2012   Procedure: COLONOSCOPY;  Surgeon: Sable Feil, MD;  Location: [facility];  Service: Endoscopy;  Laterality: N/A;  . DOPPLER ECHOCARDIOGRAPHY  2003  . EP IMPLANTABLE DEVICE N/A 07/14/2016   Procedure: ICD Generator Changeout;  Surgeon: Evans Lance, MD;  Location: [facility];  Service: Cardiovascular;  Laterality: N/A;  . ESOPHAGOGASTRODUODENOSCOPY  06/20/2012   Procedure: ESOPHAGOGASTRODUODENOSCOPY (EGD);  Surgeon: Sable Feil, MD;  Location: [facility];  Service: Endoscopy;  Laterality: N/A;  . EYE SURGERY    . LAPAROTOMY    . RETINOPATHY SURGERY Bilateral   . rotator cuff surgery     left     A IV Location/Drains/Wounds Patient Lines/Drains/Airways Status   Active Line/Drains/Airways    Name:   Placement date:   Placement time:   Site:   Days:   Peripheral IV 12/25/18 Left Forearm   12/25/18    1444    Forearm   less than 1   Incision (Closed) 07/14/16 Chest Left   07/14/16    1308     894  Intake/Output Last 24 hours No intake or output data in the 24 hours ending 12/25/18 1900  Labs/Imaging Results for orders placed or performed during the hospital encounter of 12/25/18 (from the past 48 hour(s))  CBC     Status: None   Collection Time: 12/25/18  2:41 PM  Result Value Ref Range   WBC 8.6 4.0 - 10.5 K/uL   RBC 4.43 4.22 - 5.81 MIL/uL   Hemoglobin 13.6 13.0 - 17.0 g/dL   HCT 40.4 39.0 - 52.0 %   MCV 91.2 80.0 - 100.0 fL   MCH 30.7 26.0 - 34.0 pg   MCHC 33.7 30.0 - 36.0 g/dL   RDW 14.1 11.5 - 15.5 %   Platelets 189 150 - 400 K/uL   nRBC 0.0 0.0 - 0.2 %    Comment: Performed at [facility], [name] [address] [place], [place] [address]  Comprehensive  metabolic panel     Status: Abnormal   Collection Time: 12/25/18  2:41 PM  Result Value Ref Range   Sodium 138 135 - 145 mmol/L   Potassium 4.0 3.5 - 5.1 mmol/L   Chloride 103 98 - 111 mmol/L   CO2 25 22 - 32 mmol/L   Glucose, Bld 148 (H) 70 - 99 mg/dL   BUN 26 (H) 8 - 23 mg/dL   Creatinine, Ser 2.04 (H) 0.61 - 1.24 mg/dL   Calcium 9.2 8.9 - 10.3 mg/dL   Total Protein 7.3 6.5 - 8.1 g/dL   Albumin 3.8 3.5 - 5.0 g/dL   AST 36 15 - 41 U/L   ALT 27 0 - 44 U/L   Alkaline Phosphatase 92 38 - 126 U/L   Total Bilirubin 0.8 0.3 - 1.2 mg/dL   GFR calc non Af Amer 29 (L) >60 mL/min   GFR calc Af Amer 34 (L) >60 mL/min   Anion gap 10 5 - 15    Comment: Performed at [facility] [place] [address], [place], [place] [address]  Troponin I - ONCE - STAT     Status: Abnormal   Collection Time: 12/25/18  2:41 PM  Result Value Ref Range   Troponin I 3.05 (HH) <0.03 ng/mL    Comment: CRITICAL RESULT CALLED TO, READ BACK BY AND VERIFIED WITH: Dexton Zwilling RN AT 3536 12/25/2018 BY [facility] Performed at [facility], [address] [place], [place] [address]   Urinalysis, Routine w reflex microscopic     Status: None   Collection Time: 12/25/18  3:48 PM  Result Value Ref Range   Color, Urine YELLOW YELLOW   APPearance CLEAR CLEAR   Specific Gravity, Urine 1.010 1.005 - 1.030   pH 7.0 5.0 - 8.0   Glucose, UA NEGATIVE NEGATIVE mg/dL   Hgb urine dipstick NEGATIVE NEGATIVE   Bilirubin Urine NEGATIVE NEGATIVE   Ketones, ur NEGATIVE NEGATIVE mg/dL   Protein, ur NEGATIVE NEGATIVE mg/dL   Nitrite NEGATIVE NEGATIVE   Leukocytes,Ua NEGATIVE NEGATIVE    Comment: Performed at [facility] [address] [place], [place] [address]   Ct Head Wo Contrast  Result Date: 12/25/2018 CLINICAL DATA:  Slurred speech, facial droop EXAM: CT HEAD WITHOUT CONTRAST TECHNIQUE: Contiguous axial images were obtained from the base of the skull through the vertex without intravenous contrast. COMPARISON:   Report 01/13/2002 FINDINGS: Brain: No acute territorial infarction, hemorrhage or intracranial mass. Moderate atrophy. Mild small vessel ischemic changes of the white matter. Chronic appearing lacunar infarct within the left basal  ganglia and white matter. Mildly prominent ventricles, felt secondary to atrophy. Vascular: No hyperdense vessels.  Carotid vascular calcification Skull: Normal. Negative for fracture or focal lesion. Sinuses/Orbits: Mild mucosal thickening in the ethmoid sinuses. Other: None IMPRESSION: 1. No CT evidence for acute intracranial abnormality. 2. Atrophy and mild small vessel ischemic changes of the white matter Electronically Signed   By: Donavan Foil M.D.   On: 12/25/2018 15:29   Dg Chest Port 1 View  Result Date: 12/25/2018 CLINICAL DATA:  Chest pain.  History of sleep apnea and CABG. EXAM: PORTABLE CHEST 1 VIEW COMPARISON:  Radiographs 06/09/2016 and 08/29/2015. FINDINGS: 1434 hours. Left subclavian AICD leads appear unchanged. The heart size and mediastinal contours are stable post median sternotomy and CABG. There are lower lung volumes with mildly increased atelectasis at both lung bases. No edema, confluent airspace opacity, pleural effusion or pneumothorax. The bones appear unchanged. Telemetry leads overlie the chest. IMPRESSION: Slightly lower lung volumes with resulting mild bibasilar atelectasis. Otherwise stable postoperative chest without acute findings. Electronically Signed   By: Richardean Sale M.D.   On: 12/25/2018 15:13   Ct Angio Chest/abd/pel For Dissection W And/or Wo Contrast  Result Date: 12/25/2018 CLINICAL DATA:  Transient altered mental status and dysphasia earlier today. EXAM: CT ANGIOGRAPHY CHEST, ABDOMEN AND PELVIS TECHNIQUE: Multidetector CT imaging through the chest, abdomen and pelvis was performed using the standard protocol during bolus administration of intravenous contrast. Multiplanar reconstructed images and MIPs were obtained and reviewed to  evaluate the vascular anatomy. CONTRAST:  81mL OMNIPAQUE IOHEXOL 300 MG/ML  SOLN COMPARISON:  Chest radiographs today and 06/09/2016. FINDINGS: CTA CHEST FINDINGS Cardiovascular: Pre contrast images demonstrate atherosclerosis of the aorta, great vessels and coronary arteries status post median sternotomy and CABG. There are no displaced intimal calcifications. Following contrast, the aortic lumen opacifies normally. There is no evidence of aneurysm or dissection. There is suboptimal opacification of the pulmonary arteries, but no evidence of pulmonary embolism. Patient has a pacemaker. The heart is mildly enlarged. No significant pericardial effusion. Mediastinum/Nodes: There are mildly enlarged mediastinal lymph nodes, including an 11 mm right paratracheal node on image 27/8 and subcarinal nodes measuring 13 mm on image 63/8 and 13 mm on image 69/8. No hilar or axillary lymphadenopathy. The thyroid gland, trachea and esophagus demonstrate no significant findings. Lungs/Pleura: There is no pleural effusion or pneumothorax. Mild subpleural reticulation is present within both lungs which appears chronic. There is mild atelectasis at the right lung base. No confluent airspace opacity or suspicious pulmonary nodule. Musculoskeletal/Chest wall: There is no chest wall mass or suspicious osseous finding. There is nonunion of the median sternotomy, although the sternotomy wires are intact. Review of the MIP images confirms the above findings. CTA ABDOMEN AND PELVIS FINDINGS VASCULAR Aorta: Mild atherosclerosis without evidence of aneurysm, dissection or stenosis. Celiac: Mild ostial stenosis. Otherwise widely patent with conventional anatomy. SMA: Proximal atherosclerosis without stenosis. Renals: Atherosclerosis without stenosis. IMA: Patent. Inflow: Mild atherosclerosis without stenosis. Veins: Limited opacification.  No suggested acute findings. Review of the MIP images confirms the above findings. NON-VASCULAR  Hepatobiliary: No focal hepatic abnormalities or abnormal enhancement. There are small gallstones. No evidence of gallbladder wall thickening or biliary dilatation. Pancreas: Unremarkable. No evidence of pancreatic mass, ductal dilatation or surrounding inflammation. Spleen: Normal in size without focal abnormality. Adrenals/Urinary Tract: Both adrenal glands appear normal. Both kidneys demonstrate mild cortical thinning, but no abnormal enhancement or calculi. There is no hydronephrosis. The bladder appears unremarkable. Stomach/Bowel: No evidence of bowel wall  thickening, distention or surrounding inflammation. The appendix appears normal. Mild sigmoid diverticulosis. Lymphatic: There are no enlarged abdominopelvic lymph nodes. Reproductive: The prostate gland and seminal vesicles appear normal. Other: Small left inguinal hernia containing only fat. No ascites. Postsurgical changes in the anterior abdominal wall. Musculoskeletal: No acute or suspicious osseous findings. There is multilevel thoracolumbar scoliosis. There is a sclerotic lesion laterally in the left 8th rib which is likely an incidental bone island. Review of the MIP images confirms the above findings. IMPRESSION: 1. No acute vascular findings. There is aortic and branch vessel atherosclerosis with mild ostial stenosis of the celiac trunk. No large vessel occlusion, aneurysm or dissection. 2. No evidence of acute pulmonary embolism. 3. Small mediastinal lymph nodes, likely reactive. 4. Cholelithiasis without evidence of cholecystitis. 5. Postsurgical changes as described. Electronically Signed   By: Richardean Sale M.D.   On: 12/25/2018 17:43    Pending Labs Unresulted Labs (From admission, onward)    Start     Ordered   12/26/18 0500  Hemoglobin A1c  Tomorrow morning,   R     12/25/18 1838   12/26/18 0500  Lipid panel  Tomorrow morning,   R    Comments:  Fasting    12/25/18 1838   12/26/18 0500  CBC  Tomorrow morning,   R     12/25/18  1838   12/26/18 0500  Comprehensive metabolic panel  Tomorrow morning,   R     12/25/18 1838   12/25/18 2300  Troponin I - Now Then Q6H  Now then every 6 hours,   R     12/25/18 1838   12/25/18 2300  CK total and CKMB (cardiac)not at [facility]  Once,   R     12/25/18 1838   12/25/18 2300  TSH  Once,   R     12/25/18 1838   12/25/18 2300  Vitamin B12  Once,   R     12/25/18 1838   12/25/18 2300  Sedimentation rate  Once,   R     12/25/18 1838   12/25/18 2300  RPR  Once,   R     12/25/18 1838   12/25/18 1800  Troponin I - ONCE - STAT  ONCE - STAT,   R     12/25/18 1654          Vitals/Pain Today's Vitals   12/25/18 1730 12/25/18 1800 12/25/18 1815 12/25/18 1830  BP:  (!) 148/82 (!) 158/73 (!) 172/79  Pulse:  61 60 (!) 59  Resp:    14  Temp:      TempSrc:      SpO2:  99% 100% 100%  Weight:      Height:      PainSc: 0-No pain       Isolation Precautions No active isolations  Medications Medications   stroke: mapping our early stages of recovery book (has no administration in time range)  acetaminophen (TYLENOL) tablet 650 mg (has no administration in time range)    Or  acetaminophen (TYLENOL) solution 650 mg (has no administration in time range)    Or  acetaminophen (TYLENOL) suppository 650 mg (has no administration in time range)  aspirin suppository 300 mg (has no administration in time range)    Or  aspirin tablet 325 mg (has no administration in time range)  sodium chloride 0.9 % bolus 1,000 mL (0 mLs Intravenous Stopped 12/25/18 1745)  labetalol (NORMODYNE) injection 10 mg (10 mg Intravenous Given 12/25/18 1720)  iohexol (OMNIPAQUE) 300 MG/ML solution 100 mL (80 mLs Intravenous Contrast Given 12/25/18 1643)    Mobility walks with person assist High fall risk   Focused Assessments Neuro Assessment Handoff:  Swallow screen pass? Yes   NIH Stroke Scale ( + Modified Stroke Scale Criteria)  Interval: Initial Level of Consciousness (1a.)   : Alert, keenly  responsive LOC Questions (1b. )   +: Answers both questions correctly LOC Commands (1c. )   + : Performs both tasks correctly Best Gaze (2. )  +: Normal Visual (3. )  +: No visual loss Facial Palsy (4. )    : Normal symmetrical movements Motor Arm, Left (5a. )   +: Drift Motor Arm, Right (5b. )   +: No drift Motor Leg, Left (6a. )   +: No drift Motor Leg, Right (6b. )   +: No drift Limb Ataxia (7. ): Absent Sensory (8. )   +: Normal, no sensory loss Best Language (9. )   +: Mild-to-moderate aphasia Dysarthria (10. ): Normal Extinction/Inattention (11.)   +: No Abnormality Modified SS Total  +: 2 Complete NIHSS TOTAL: 2 Last date known well: 12/25/18 Last time known well: 1224 Neuro Assessment:   Neuro Checks:   Initial (12/25/18 1415)  Last Documented NIHSS Modified Score: 2 (12/25/18 1415) Has TPA been given? No If patient is a Neuro Trauma and patient is going to OR before floor call report to [facility] nurse: [phone] or [phone]     R Recommendations: See Admitting Provider Note  Report given to:   Additional Notes:

## 2018-12-25 NOTE — H&P (Addendum)
TRH H&P    Patient Demographics:    Manuel Kline, is a 85 y.o. male  MRN: [id number]  DOB - 30-Jan-1932  Admit Date - 12/25/2018  Referring MD/NP/PA: Lajean Saver  Outpatient Primary MD for the patient is Dettinger, Fransisca Kaufmann, MD Cardiology , [facility] [place]  Patient coming from:   home  Chief complaint- syncope, left sided weakness, slurred speech   HPI:    Manuel Kline  is a 85 y.o. male,  w CAD s/p CABG, CHF (EF 45%), h/o ICD, Dm2, Neuropathy, apparently was at church sitting in car when he was noted to have AMS, ? Syncope,  Pt states he was passed out for a few minutes.  Pt states had slurred speech, left upper extremity weakness lasting for a few minutes.  Pt currently at his baseline.  Pt denies any presyncopal symptoms.  Pt denies fever, chills, cough, cp, palp, sob, n/v, abd pain, diarrhea, brbpr.   Pt is not sure what his sugar was during this episode.  Has had some hypoglycemia in the past.  Pt denies any recent medication changes.   Pt was brought to ER for evaluation. T 97.7  P 60  Bp 140/72  Pox 100% on RA  CT brain IMPRESSION: 1. No CT evidence for acute intracranial abnormality. 2. Atrophy and mild small vessel ischemic changes of the white matter  CTA chest IMPRESSION: 1. No acute vascular findings. There is aortic and branch vessel atherosclerosis with mild ostial stenosis of the celiac trunk. No large vessel occlusion, aneurysm or dissection. 2. No evidence of acute pulmonary embolism. 3. Small mediastinal lymph nodes, likely reactive. 4. Cholelithiasis without evidence of cholecystitis. 5. Postsurgical changes as described.  Wbc 8.6 Hgb 13.6  Plt 189  Na 138, K 4.0 , Glucose 148 Bun 26, Creatinine 2.04 (baseline 2) Ast 36, Alt 27 Urinalysis negative Trop 3.05  Pt was evaluated by cardiology, see recommendations.   Pt will be admitted for syncope/ AMS, left upper extremity  weakness, slurred speech, and troponin elevation.        Review of systems:    In addition to the HPI above,  No Fever-chills, No Headache, No changes with Vision or hearing, No problems swallowing food or Liquids, No Chest pain, No Cough or Shortness of Breath, No Abdominal pain, No Nausea or Vomiting, bowel movements are regular, No Blood in stool or Urine, No dysuria, No new skin rashes or bruises, No new joints pains-aches,   No recent weight gain or loss, No polyuria, polydypsia or polyphagia, No significant Mental Stressors.  All other systems reviewed and are negative.    Past History of the following :    Past Medical History:  Diagnosis Date   Adenomatous colon polyp 2006   CAD (coronary artery disease)    Calcium oxalate renal stones    Cardiomyopathy    Cataract    Diabetes ([place])    Erectile dysfunction    Erosive esophagitis    Hemorrhoids    HLD (hyperlipidemia)    HTN (hypertension)  Hypertensive heart disease without CHF 07/31/2011   ICD (implantable cardiac defibrillator) in place    ICD dual chamber in situ    Metabolic syndrome    Morbid obesity ([place])    Osteoarthritis    S/P CABG (coronary artery bypass graft) 11/02/2000   Sleep apnea       Past Surgical History:  Procedure Laterality Date   ABDOMINAL EXPLORATION SURGERY     BACK SURGERY     X'3   cardiac bypass     CARDIAC DEFIBRILLATOR PLACEMENT     CARPAL TUNNEL RELEASE     X2, bilateral   CATARACT EXTRACTION     COLONOSCOPY  06/20/2012   Procedure: COLONOSCOPY;  Surgeon: Sable Feil, MD;  Location: [facility];  Service: Endoscopy;  Laterality: N/A;   DOPPLER ECHOCARDIOGRAPHY  2003   EP IMPLANTABLE DEVICE N/A 07/14/2016   Procedure: ICD Generator Changeout;  Surgeon: Evans Lance, MD;  Location: [facility];  Service: Cardiovascular;  Laterality: N/A;   ESOPHAGOGASTRODUODENOSCOPY  06/20/2012   Procedure: ESOPHAGOGASTRODUODENOSCOPY  (EGD);  Surgeon: Sable Feil, MD;  Location: [facility];  Service: Endoscopy;  Laterality: N/A;   EYE SURGERY     LAPAROTOMY     RETINOPATHY SURGERY Bilateral    rotator cuff surgery     left      Social History:      Social History   Tobacco Use   Smoking status: Never Smoker   Smokeless tobacco: Never Used  Substance Use Topics   Alcohol use: No    Alcohol/week: 0.0 standard drinks       Family History :     Family History  Problem Relation Age of Onset   Diabetes Brother    Heart disease Father    Heart disease Mother    Diabetes Mother    Diabetes Brother    Diabetes Brother    Cancer Brother        baldder   Heart disease Brother    Diabetes Sister    Diabetes Sister    Kidney disease Sister        dialysis   Diabetes Sister    Diabetes Sister    Diabetes Sister    Throat cancer Paternal Uncle        Home Medications:   Prior to Admission medications   Medication Sig Start Date End Date Taking? Authorizing Provider  allopurinol (ZYLOPRIM) 300 MG tablet TAKE 1/2 (ONE-HALF) TABLET BY MOUTH ONCE DAILY Patient taking differently: Take 150 mg by mouth daily.  10/20/18  Yes Dettinger, Fransisca Kaufmann, MD  aspirin 81 MG tablet Take 81 mg by mouth daily.    Yes [provider]  atorvastatin (LIPITOR) 80 MG tablet Take 0.5 tablets (40 mg total) by mouth daily. 10/17/18  Yes Park Liter, MD  cloNIDine (CATAPRES) 0.1 MG tablet TAKE 1 TABLET BY MOUTH TWICE DAILY Patient taking differently: Take 0.1 mg by mouth 2 (two) times daily.  10/20/18  Yes Park Liter, MD  Cyanocobalamin (VITAMIN B 12 PO) Take 1,000 mcg by mouth daily.   Yes [provider]  furosemide (LASIX) 40 MG tablet Take 1 tablet (40 mg total) by mouth 2 (two) times daily. 10/17/18  Yes Park Liter, MD  gabapentin (NEURONTIN) 300 MG capsule Take 1 capsule (300 mg total) by mouth 2 (two) times daily. 10/20/18  Yes Dettinger, Fransisca Kaufmann, MD   insulin NPH Human (HUMULIN N) 100 UNIT/ML injection INJECT 35 UNITS BID Patient  taking differently: Inject 35 Units into the skin 2 (two) times daily before a meal.  09/13/18  Yes Eustaquio Maize, MD  insulin regular (HUMULIN R) 100 units/mL injection Inject 0.3 mLs (30 Units total) into the skin 2 (two) times daily before a meal. 07/19/18  Yes Eustaquio Maize, MD  metoprolol succinate (TOPROL-XL) 50 MG 24 hr tablet Take 1 tablet (50 mg total) by mouth daily. 10/17/18  Yes Park Liter, MD  nitroGLYCERIN (NITROSTAT) 0.4 MG SL tablet Place 0.4 mg under the tongue every 5 (five) minutes as needed for chest pain.   Yes [provider]  Omega-3 Fatty Acids (FISH OIL) 1000 MG CPDR Take 1 tablet by mouth daily.   Yes [provider]  polyethylene glycol powder (GLYCOLAX/MIRALAX) powder Take 17 g by mouth 2 (two) times daily as needed. 12/22/17  Yes Eustaquio Maize, MD  quinapril (ACCUPRIL) 40 MG tablet TAKE 1 TABLET BY MOUTH AT BEDTIME Patient taking differently: Take 40 mg by mouth daily.  10/20/18  Yes Park Liter, MD  glucose blood (ACCU-CHEK GUIDE) test strip 1 each by Other route 5 (five) times daily. Use as instructed 10/20/18   Dettinger, Fransisca Kaufmann, MD  glucose monitoring kit (FREESTYLE) monitoring kit 1 each by Does not apply route as needed for other. 10/20/18   Dettinger, Fransisca Kaufmann, MD  Insulin Pen Needle 29G X 12.7MM MISC Use ti inject insulin 5 times a day 10/20/18   Dettinger, Fransisca Kaufmann, MD     Allergies:    No Known Allergies   Physical Exam:   Vitals  Blood pressure (!) 172/79, pulse (!) 59, temperature 97.7 F (36.5 C), temperature source Oral, resp. rate 14, height 5' (1.524 m), weight 106.1 kg, SpO2 100 %.  1.  General: axox3  2. Psychiatric: euthymic  3. Neurologic: cn2-12 intact, reflexes 2+ symmetric, diffuse with no clonus, motor 5/5 in all 4 ext  4. HEENMT:  Anicteric, pupils, 1.32m symmetric, direct, consensual, near intact  5.  Respiratory : CTAB  6. Cardiovascular : Midline scar, rrr s1, s2, no m/g/r  7. Gastrointestinal:  Abd: soft, obese, nt, nd, +bs  8. Skin:  Ext No c/c/e, no rash  9.Musculoskeletal:  Good ROM  No adenopathy    Data Review:    CBC Recent Labs  Lab 12/25/18 1441  WBC 8.6  HGB 13.6  HCT 40.4  PLT 189  MCV 91.2  MCH 30.7  MCHC 33.7  RDW 14.1   ------------------------------------------------------------------------------------------------------------------  Results for orders placed or performed during the hospital encounter of 12/25/18 (from the past 48 hour(s))  CBC     Status: None   Collection Time: 12/25/18  2:41 PM  Result Value Ref Range   WBC 8.6 4.0 - 10.5 K/uL   RBC 4.43 4.22 - 5.81 MIL/uL   Hemoglobin 13.6 13.0 - 17.0 g/dL   HCT 40.4 39.0 - 52.0 %   MCV 91.2 80.0 - 100.0 fL   MCH 30.7 26.0 - 34.0 pg   MCHC 33.7 30.0 - 36.0 g/dL   RDW 14.1 11.5 - 15.5 %   Platelets 189 150 - 400 K/uL   nRBC 0.0 0.0 - 0.2 %    Comment: Performed at [facility] [address] [place] [id number] Comprehensive metabolic panel     Status: Abnormal   Collection Time: 12/25/18  2:41 PM  Result Value Ref Range   Sodium 138 135 - 145 mmol/L   Potassium 4.0 3.5 -  5.1 mmol/L   Chloride 103 98 - 111 mmol/L   CO2 25 22 - 32 mmol/L   Glucose, Bld 148 (H) 70 - 99 mg/dL   BUN 26 (H) 8 - 23 mg/dL   Creatinine, Ser 2.04 (H) 0.61 - 1.24 mg/dL   Calcium 9.2 8.9 - 10.3 mg/dL   Total Protein 7.3 6.5 - 8.1 g/dL   Albumin 3.8 3.5 - 5.0 g/dL   AST 36 15 - 41 U/L   ALT 27 0 - 44 U/L   Alkaline Phosphatase 92 38 - 126 U/L   Total Bilirubin 0.8 0.3 - 1.2 mg/dL   GFR calc non Af Amer 29 (L) >60 mL/min   GFR calc Af Amer 34 (L) >60 mL/min   Anion gap 10 5 - 15    Comment: Performed at [facility], [address] [place], [place] [address]  Troponin I - ONCE - STAT     Status: Abnormal   Collection Time: 12/25/18  2:41 PM  Result Value Ref Range   Troponin I  3.05 (HH) <0.03 ng/mL    Comment: CRITICAL RESULT CALLED TO, READ BACK BY AND VERIFIED WITH: NADIA ZOHBI RN AT 2774 12/25/2018 BY [facility] Performed at [facility], [address], [place], [place] [address]   Urinalysis, Routine w reflex microscopic     Status: None   Collection Time: 12/25/18  3:48 PM  Result Value Ref Range   Color, Urine YELLOW YELLOW   APPearance CLEAR CLEAR   Specific Gravity, Urine 1.010 1.005 - 1.030   pH 7.0 5.0 - 8.0   Glucose, UA NEGATIVE NEGATIVE mg/dL   Hgb urine dipstick NEGATIVE NEGATIVE   Bilirubin Urine NEGATIVE NEGATIVE   Ketones, ur NEGATIVE NEGATIVE mg/dL   Protein, ur NEGATIVE NEGATIVE mg/dL   Nitrite NEGATIVE NEGATIVE   Leukocytes,Ua NEGATIVE NEGATIVE    Comment: Performed at [facility] [address], [place], [place] [address]  Troponin I - ONCE - STAT     Status: Abnormal   Collection Time: 12/25/18  6:04 PM  Result Value Ref Range   Troponin I 3.18 (HH) <0.03 ng/mL    Comment: CRITICAL VALUE NOTED.  VALUE IS CONSISTENT WITH PREVIOUSLY REPORTED AND CALLED VALUE. Performed at [facility], [place] [address], [place], [place] [address]     Chemistries  Recent Labs  Lab 12/25/18 1441  NA 138  K 4.0  CL 103  CO2 25  GLUCOSE 148*  BUN 26*  CREATININE 2.04*  CALCIUM 9.2  AST 36  ALT 27  ALKPHOS 92  BILITOT 0.8   ------------------------------------------------------------------------------------------------------------------  ------------------------------------------------------------------------------------------------------------------ GFR: Estimated Creatinine Clearance: 27.6 mL/min (A) (by C-G formula based on SCr of 2.04 mg/dL (H)). Liver Function Tests: Recent Labs  Lab 12/25/18 1441  AST 36  ALT 27  ALKPHOS 92  BILITOT 0.8  PROT 7.3  ALBUMIN 3.8   No results for input(s): LIPASE, AMYLASE in the last 168 hours. No results for input(s): AMMONIA in the last 168 hours. Coagulation Profile: No  results for input(s): INR, PROTIME in the last 168 hours. Cardiac Enzymes: Recent Labs  Lab 12/25/18 1441 12/25/18 1804  TROPONINI 3.05* 3.18*   BNP (last 3 results) No results for input(s): PROBNP in the last 8760 hours. HbA1C: No results for input(s): HGBA1C in the last 72 hours. CBG: No results for input(s): GLUCAP in the last 168 hours. Lipid Profile: No results for input(s): CHOL, HDL, LDLCALC, TRIG, CHOLHDL, LDLDIRECT in the  last 72 hours. Thyroid Function Tests: No results for input(s): TSH, T4TOTAL, FREET4, T3FREE, THYROIDAB in the last 72 hours. Anemia Panel: No results for input(s): VITAMINB12, FOLATE, FERRITIN, TIBC, IRON, RETICCTPCT in the last 72 hours.  --------------------------------------------------------------------------------------------------------------- Urine analysis:    Component Value Date/Time   COLORURINE YELLOW 12/25/2018 [place] 12/25/2018 1548   LABSPEC 1.010 12/25/2018 1548   PHURINE 7.0 12/25/2018 1548   GLUCOSEU NEGATIVE 12/25/2018 1548   HGBUR NEGATIVE 12/25/2018 1548   BILIRUBINUR NEGATIVE 12/25/2018 1548   BILIRUBINUR negative 11/01/2015 1123   KETONESUR NEGATIVE 12/25/2018 1548   PROTEINUR NEGATIVE 12/25/2018 1548   UROBILINOGEN negative 11/01/2015 1123   NITRITE NEGATIVE 12/25/2018 1548   LEUKOCYTESUR NEGATIVE 12/25/2018 1548      Imaging Results:    Ct Head Wo Contrast  Result Date: 12/25/2018 CLINICAL DATA:  Slurred speech, facial droop EXAM: CT HEAD WITHOUT CONTRAST TECHNIQUE: Contiguous axial images were obtained from the base of the skull through the vertex without intravenous contrast. COMPARISON:  Report 01/13/2002 FINDINGS: Brain: No acute territorial infarction, hemorrhage or intracranial mass. Moderate atrophy. Mild small vessel ischemic changes of the white matter. Chronic appearing lacunar infarct within the left basal ganglia and white matter. Mildly prominent ventricles, felt secondary to atrophy.  Vascular: No hyperdense vessels.  Carotid vascular calcification Skull: Normal. Negative for fracture or focal lesion. Sinuses/Orbits: Mild mucosal thickening in the ethmoid sinuses. Other: None IMPRESSION: 1. No CT evidence for acute intracranial abnormality. 2. Atrophy and mild small vessel ischemic changes of the white matter Electronically Signed   By: Donavan Foil M.D.   On: 12/25/2018 15:29   Dg Chest Port 1 View  Result Date: 12/25/2018 CLINICAL DATA:  Chest pain.  History of sleep apnea and CABG. EXAM: PORTABLE CHEST 1 VIEW COMPARISON:  Radiographs 06/09/2016 and 08/29/2015. FINDINGS: 1434 hours. Left subclavian AICD leads appear unchanged. The heart size and mediastinal contours are stable post median sternotomy and CABG. There are lower lung volumes with mildly increased atelectasis at both lung bases. No edema, confluent airspace opacity, pleural effusion or pneumothorax. The bones appear unchanged. Telemetry leads overlie the chest. IMPRESSION: Slightly lower lung volumes with resulting mild bibasilar atelectasis. Otherwise stable postoperative chest without acute findings. Electronically Signed   By: Richardean Sale M.D.   On: 12/25/2018 15:13   Ct Angio Chest/abd/pel For Dissection W And/or Wo Contrast  Result Date: 12/25/2018 CLINICAL DATA:  Transient altered mental status and dysphasia earlier today. EXAM: CT ANGIOGRAPHY CHEST, ABDOMEN AND PELVIS TECHNIQUE: Multidetector CT imaging through the chest, abdomen and pelvis was performed using the standard protocol during bolus administration of intravenous contrast. Multiplanar reconstructed images and MIPs were obtained and reviewed to evaluate the vascular anatomy. CONTRAST:  107m OMNIPAQUE IOHEXOL 300 MG/ML  SOLN COMPARISON:  Chest radiographs today and 06/09/2016. FINDINGS: CTA CHEST FINDINGS Cardiovascular: Pre contrast images demonstrate atherosclerosis of the aorta, great vessels and coronary arteries status post median sternotomy and  CABG. There are no displaced intimal calcifications. Following contrast, the aortic lumen opacifies normally. There is no evidence of aneurysm or dissection. There is suboptimal opacification of the pulmonary arteries, but no evidence of pulmonary embolism. Patient has a pacemaker. The heart is mildly enlarged. No significant pericardial effusion. Mediastinum/Nodes: There are mildly enlarged mediastinal lymph nodes, including an 11 mm right paratracheal node on image 27/8 and subcarinal nodes measuring 13 mm on image 63/8 and 13 mm on image 69/8. No hilar or axillary lymphadenopathy. The thyroid gland, trachea and esophagus demonstrate  no significant findings. Lungs/Pleura: There is no pleural effusion or pneumothorax. Mild subpleural reticulation is present within both lungs which appears chronic. There is mild atelectasis at the right lung base. No confluent airspace opacity or suspicious pulmonary nodule. Musculoskeletal/Chest wall: There is no chest wall mass or suspicious osseous finding. There is nonunion of the median sternotomy, although the sternotomy wires are intact. Review of the MIP images confirms the above findings. CTA ABDOMEN AND PELVIS FINDINGS VASCULAR Aorta: Mild atherosclerosis without evidence of aneurysm, dissection or stenosis. Celiac: Mild ostial stenosis. Otherwise widely patent with conventional anatomy. SMA: Proximal atherosclerosis without stenosis. Renals: Atherosclerosis without stenosis. IMA: Patent. Inflow: Mild atherosclerosis without stenosis. Veins: Limited opacification.  No suggested acute findings. Review of the MIP images confirms the above findings. NON-VASCULAR Hepatobiliary: No focal hepatic abnormalities or abnormal enhancement. There are small gallstones. No evidence of gallbladder wall thickening or biliary dilatation. Pancreas: Unremarkable. No evidence of pancreatic mass, ductal dilatation or surrounding inflammation. Spleen: Normal in size without focal abnormality.  Adrenals/Urinary Tract: Both adrenal glands appear normal. Both kidneys demonstrate mild cortical thinning, but no abnormal enhancement or calculi. There is no hydronephrosis. The bladder appears unremarkable. Stomach/Bowel: No evidence of bowel wall thickening, distention or surrounding inflammation. The appendix appears normal. Mild sigmoid diverticulosis. Lymphatic: There are no enlarged abdominopelvic lymph nodes. Reproductive: The prostate gland and seminal vesicles appear normal. Other: Small left inguinal hernia containing only fat. No ascites. Postsurgical changes in the anterior abdominal wall. Musculoskeletal: No acute or suspicious osseous findings. There is multilevel thoracolumbar scoliosis. There is a sclerotic lesion laterally in the left 8th rib which is likely an incidental bone island. Review of the MIP images confirms the above findings. IMPRESSION: 1. No acute vascular findings. There is aortic and branch vessel atherosclerosis with mild ostial stenosis of the celiac trunk. No large vessel occlusion, aneurysm or dissection. 2. No evidence of acute pulmonary embolism. 3. Small mediastinal lymph nodes, likely reactive. 4. Cholelithiasis without evidence of cholecystitis. 5. Postsurgical changes as described. Electronically Signed   By: Richardean Sale M.D.   On: 12/25/2018 17:43   ekg nsr at 17, q in 3, avf, v1-3 (old)   Assessment & Plan:    Active Problems:   Type 2 diabetes, uncontrolled, with neuropathy (HCC)   Syncope   CAD (coronary artery disease)   CKD (chronic kidney disease) stage 4, GFR 15-29 ml/min (HCC)   Left-sided weakness   Elevated troponin  Syncope, AMS, Left sided weakness/ slurred speech Tele Check b12, esr, rpr, TSH Check hga1c, Lipid consider CTA head/neck(unable to have MRI due to ICD), defer to neurology, please follow up on creatinine in AM, also he had CTA chest/ abd/ pelvis, and probably can't have this test til >24hours later Check carotid  ultrasound Check cardiac echo PT/OT/ speech evaluation Cont Aspirin (increase to 356m po qday) Cont Neurology consult requested, appreciate input  Elevated troponin ddx secondary to Syncope/ CVA ddx ACS, renal insufficiency Check trop I q6h x3 Check cpk, mb Check cardiac echo Cont aspirin 3235mpo qday Cont Lipitor 8015mo qhs Cont Toprol XL 70m39m qday Cont Lasix 40mg38mqday DC Quinapril 40mg 23mday Start Hydralazine 10mg p69md Cardiology consulted by ED, appreciate input  H/o CHF (EF 45%) Cont Toprol XL, Hydralazine,  Lasix as above  Dm2 fsbs ac and qhs, ISS Decrease NPH to 20 units [place] bid Decrease Humulin R to 20units [place] bid  Diabetic neuropathy Cont Gabapentin 300mg po65m  CKD stage 4  HOLD QUINAPRIL due to recent dye load Ns already received in ED ?1-2 L Check cmp in am  ? H/o Gout Cont Allopurinol 327m 1/2 po qday   DVT Prophylaxis-    - SCDs   AM Labs Ordered, also please review Full Orders  Family Communication: Admission, patients condition and plan of care including tests being ordered have been discussed with the patient who indicate understanding and agree with the plan and Code Status.  Code Status:   FULL CODE  Admission status: Observation : Based on patients clinical presentation and evaluation of above clinical data, I have made determination that patient meets observation criteria at this time.  Pt will be admitted for syncope/ TIA vs CVA (left upper ext weakness, slurred speech), and troponin elevation.  Pt will require further evaluation of each of these issues.  Depending upon the results of the w/up he may need inpatient status.   Time spent in minutes : 70   JJani GravelM.D on 12/25/2018 at 7:29 PM

## 2018-12-25 NOTE — ED Notes (Signed)
ED Provider at bedside. 

## 2018-12-25 NOTE — ED Notes (Signed)
Pt's primary contact is his daughter-in-law, Jacqualin Combes. Please call her when possible with updates or questions. Thank you!  [phone]

## 2018-12-25 NOTE — ED Notes (Signed)
Patient transported to CT 

## 2018-12-26 ENCOUNTER — Observation Stay (HOSPITAL_COMMUNITY): Payer: Medicare Other

## 2018-12-26 DIAGNOSIS — R55 Syncope and collapse: Secondary | ICD-10-CM | POA: Diagnosis not present

## 2018-12-26 DIAGNOSIS — E1122 Type 2 diabetes mellitus with diabetic chronic kidney disease: Secondary | ICD-10-CM | POA: Diagnosis present

## 2018-12-26 DIAGNOSIS — R4781 Slurred speech: Secondary | ICD-10-CM | POA: Diagnosis present

## 2018-12-26 DIAGNOSIS — I447 Left bundle-branch block, unspecified: Secondary | ICD-10-CM | POA: Diagnosis present

## 2018-12-26 DIAGNOSIS — N183 Chronic kidney disease, stage 3 (moderate): Secondary | ICD-10-CM | POA: Diagnosis not present

## 2018-12-26 DIAGNOSIS — I6340 Cerebral infarction due to embolism of unspecified cerebral artery: Secondary | ICD-10-CM

## 2018-12-26 DIAGNOSIS — E8881 Metabolic syndrome: Secondary | ICD-10-CM | POA: Diagnosis present

## 2018-12-26 DIAGNOSIS — I5022 Chronic systolic (congestive) heart failure: Secondary | ICD-10-CM | POA: Diagnosis present

## 2018-12-26 DIAGNOSIS — I6310 Cerebral infarction due to embolism of unspecified precerebral artery: Secondary | ICD-10-CM

## 2018-12-26 DIAGNOSIS — R2990 Unspecified symptoms and signs involving the nervous system: Secondary | ICD-10-CM

## 2018-12-26 DIAGNOSIS — E1140 Type 2 diabetes mellitus with diabetic neuropathy, unspecified: Secondary | ICD-10-CM | POA: Diagnosis present

## 2018-12-26 DIAGNOSIS — E1165 Type 2 diabetes mellitus with hyperglycemia: Secondary | ICD-10-CM | POA: Diagnosis present

## 2018-12-26 DIAGNOSIS — I69354 Hemiplegia and hemiparesis following cerebral infarction affecting left non-dominant side: Secondary | ICD-10-CM | POA: Diagnosis not present

## 2018-12-26 DIAGNOSIS — E785 Hyperlipidemia, unspecified: Secondary | ICD-10-CM | POA: Diagnosis present

## 2018-12-26 DIAGNOSIS — G459 Transient cerebral ischemic attack, unspecified: Secondary | ICD-10-CM

## 2018-12-26 DIAGNOSIS — Z6841 Body Mass Index (BMI) 40.0 and over, adult: Secondary | ICD-10-CM | POA: Diagnosis not present

## 2018-12-26 DIAGNOSIS — R402142 Coma scale, eyes open, spontaneous, at arrival to emergency department: Secondary | ICD-10-CM | POA: Diagnosis present

## 2018-12-26 DIAGNOSIS — R402252 Coma scale, best verbal response, oriented, at arrival to emergency department: Secondary | ICD-10-CM | POA: Diagnosis present

## 2018-12-26 DIAGNOSIS — E1151 Type 2 diabetes mellitus with diabetic peripheral angiopathy without gangrene: Secondary | ICD-10-CM | POA: Diagnosis present

## 2018-12-26 DIAGNOSIS — R402362 Coma scale, best motor response, obeys commands, at arrival to emergency department: Secondary | ICD-10-CM | POA: Diagnosis present

## 2018-12-26 DIAGNOSIS — I2510 Atherosclerotic heart disease of native coronary artery without angina pectoris: Secondary | ICD-10-CM | POA: Diagnosis not present

## 2018-12-26 DIAGNOSIS — I130 Hypertensive heart and chronic kidney disease with heart failure and stage 1 through stage 4 chronic kidney disease, or unspecified chronic kidney disease: Secondary | ICD-10-CM | POA: Diagnosis present

## 2018-12-26 DIAGNOSIS — G4733 Obstructive sleep apnea (adult) (pediatric): Secondary | ICD-10-CM | POA: Diagnosis present

## 2018-12-26 DIAGNOSIS — R29810 Facial weakness: Secondary | ICD-10-CM | POA: Diagnosis present

## 2018-12-26 DIAGNOSIS — I429 Cardiomyopathy, unspecified: Secondary | ICD-10-CM | POA: Diagnosis present

## 2018-12-26 DIAGNOSIS — I6381 Other cerebral infarction due to occlusion or stenosis of small artery: Secondary | ICD-10-CM | POA: Diagnosis present

## 2018-12-26 DIAGNOSIS — E11319 Type 2 diabetes mellitus with unspecified diabetic retinopathy without macular edema: Secondary | ICD-10-CM | POA: Diagnosis present

## 2018-12-26 DIAGNOSIS — N184 Chronic kidney disease, stage 4 (severe): Secondary | ICD-10-CM | POA: Diagnosis not present

## 2018-12-26 DIAGNOSIS — R29702 NIHSS score 2: Secondary | ICD-10-CM | POA: Diagnosis present

## 2018-12-26 DIAGNOSIS — N529 Male erectile dysfunction, unspecified: Secondary | ICD-10-CM | POA: Diagnosis present

## 2018-12-26 DIAGNOSIS — R7989 Other specified abnormal findings of blood chemistry: Secondary | ICD-10-CM | POA: Diagnosis not present

## 2018-12-26 HISTORY — DX: Cerebral infarction due to embolism of unspecified cerebral artery: I63.40

## 2018-12-26 HISTORY — DX: Unspecified symptoms and signs involving the nervous system: R29.90

## 2018-12-26 LAB — COMPREHENSIVE METABOLIC PANEL
ALT: 22 U/L (ref 0–44)
AST: 27 U/L (ref 15–41)
Albumin: 3.5 g/dL (ref 3.5–5.0)
Alkaline Phosphatase: 76 U/L (ref 38–126)
Anion gap: 11 (ref 5–15)
BUN: 23 mg/dL (ref 8–23)
CO2: 23 mmol/L (ref 22–32)
Calcium: 8.5 mg/dL — ABNORMAL LOW (ref 8.9–10.3)
Chloride: 105 mmol/L (ref 98–111)
Creatinine, Ser: 1.9 mg/dL — ABNORMAL HIGH (ref 0.61–1.24)
GFR calc Af Amer: 37 mL/min — ABNORMAL LOW (ref >60)
GFR calc non Af Amer: 32 mL/min — ABNORMAL LOW (ref >60)
Glucose, Bld: 153 mg/dL — ABNORMAL HIGH (ref 70–99)
Potassium: 4.3 mmol/L (ref 3.5–5.1)
Sodium: 139 mmol/L (ref 135–145)
Total Bilirubin: 0.8 mg/dL (ref 0.3–1.2)
Total Protein: 6.2 g/dL — ABNORMAL LOW (ref 6.5–8.1)

## 2018-12-26 LAB — CBC
HCT: 38.3 % — ABNORMAL LOW (ref 39.0–52.0)
Hemoglobin: 13 g/dL (ref 13.0–17.0)
MCH: 31 pg (ref 26.0–34.0)
MCHC: 33.9 g/dL (ref 30.0–36.0)
MCV: 91.4 fL (ref 80.0–100.0)
Platelets: 176 10*3/uL (ref 150–400)
RBC: 4.19 MIL/uL — ABNORMAL LOW (ref 4.22–5.81)
RDW: 14.1 % (ref 11.5–15.5)
WBC: 7.2 10*3/uL (ref 4.0–10.5)
nRBC: 0 % (ref 0.0–0.2)

## 2018-12-26 LAB — HEMOGLOBIN A1C
Hgb A1c MFr Bld: 7.3 % — ABNORMAL HIGH (ref 4.8–5.6)
Mean Plasma Glucose: 162.81 mg/dL

## 2018-12-26 LAB — GLUCOSE, CAPILLARY
Glucose-Capillary: 135 mg/dL — ABNORMAL HIGH (ref 70–99)
Glucose-Capillary: 232 mg/dL — ABNORMAL HIGH (ref 70–99)
Glucose-Capillary: 174 mg/dL — ABNORMAL HIGH (ref 70–99)
Glucose-Capillary: 132 mg/dL — ABNORMAL HIGH (ref 70–99)

## 2018-12-26 LAB — LIPID PANEL
Cholesterol: 100 mg/dL (ref 0–200)
HDL: 29 mg/dL — ABNORMAL LOW (ref >40)
LDL Cholesterol: 35 mg/dL (ref 0–99)
Total CHOL/HDL Ratio: 3.4 RATIO
Triglycerides: 179 mg/dL — ABNORMAL HIGH (ref <150)
VLDL: 36 mg/dL (ref 0–40)

## 2018-12-26 LAB — ECHOCARDIOGRAM LIMITED BUBBLE STUDY
Height: 60 in
Weight: 3742.53 oz

## 2018-12-26 LAB — TROPONIN I
Troponin I: 3.24 ng/mL (ref <0.03)
Troponin I: 3.18 ng/mL (ref <0.03)

## 2018-12-26 LAB — SEDIMENTATION RATE: Sed Rate: 27 mm/hr — ABNORMAL HIGH (ref 0–16)

## 2018-12-26 LAB — RPR: RPR Ser Ql: NONREACTIVE

## 2018-12-26 MED ORDER — PERFLUTREN LIPID MICROSPHERE
4.0000 mL | Freq: Once | INTRAVENOUS | Status: AC
  Administered 2018-12-26: 11:00:00 4 mL via INTRAVENOUS

## 2018-12-26 MED ORDER — CLOPIDOGREL BISULFATE 75 MG PO TABS
75.0000 mg | ORAL_TABLET | Freq: Every day | ORAL | Status: DC
  Administered 2018-12-26 – 2018-12-27 (×2): 75 mg via ORAL
  Filled 2018-12-26 (×2): qty 1

## 2018-12-26 MED ORDER — ASPIRIN EC 81 MG PO TBEC
81.0000 mg | DELAYED_RELEASE_TABLET | Freq: Every day | ORAL | Status: DC
  Administered 2018-12-27: 81 mg via ORAL
  Filled 2018-12-26: qty 1

## 2018-12-26 NOTE — Progress Notes (Signed)
  Echocardiogram 2D Echocardiogram has been performed.  Manuel Kline 12/26/2018, 11:39 AM

## 2018-12-26 NOTE — Care Management Obs Status (Signed)
[facility] NOTIFICATION   Patient Details  Name: ASHTIN ROSNER MRN: [id number] Date of Birth: April 19, 1932   Medicare Observation Status Notification Given:  Yes    CHETT TANIGUCHI, LCSW 12/26/2018, 3:23 PM

## 2018-12-26 NOTE — Progress Notes (Signed)
Progress Note  Patient Name: Manuel Kline Date of Encounter: 12/26/2018  Primary Cardiologist: Nicholas Lose, MD  Subjective   Feeling well this morning.  No residual symptoms.  Never had chest pain, no shortness of breath.  Inpatient Medications    Scheduled Meds: . allopurinol  150 mg Oral Daily  . aspirin  300 mg Rectal Daily   Or  . aspirin  325 mg Oral Daily  . atorvastatin  40 mg Oral Daily  . furosemide  40 mg Oral BID  . gabapentin  300 mg Oral BID  . hydrALAZINE  10 mg Oral Q8H  . insulin aspart  0-5 Units Subcutaneous QHS  . insulin aspart  0-9 Units Subcutaneous TID WC  . insulin aspart  15 Units Subcutaneous BID WC  . insulin NPH Human  20 Units Subcutaneous BID AC  . metoprolol succinate  50 mg Oral Daily   Continuous Infusions:  PRN Meds: acetaminophen **OR** acetaminophen (TYLENOL) oral liquid 160 mg/5 mL **OR** acetaminophen, polyethylene glycol   Vital Signs    Vitals:   12/25/18 2013 12/25/18 2349 12/26/18 0434 12/26/18 0800  BP: (!) 171/68 (!) 114/57 (!) 120/59 (!) 142/72  Pulse: 64 62 63 60  Resp: 16 17 17 18   Temp: 97.7 F (36.5 C) 98.1 F (36.7 C) 97.9 F (36.6 C) 97.9 F (36.6 C)  TempSrc: Oral Oral Oral Oral  SpO2: 100% 96% 95% 96%  Weight:      Height:       No intake or output data in the 24 hours ending 12/26/18 1150 Filed Weights   12/25/18 1415  Weight: 106.1 kg   Physical Exam   GEN: Well nourished, well developed, in no acute distress.  HEENT: Grossly normal.  Neck: Supple, no JVD, carotid bruits, or masses. Cardiac: RRR, no murmurs, rubs, or gallops. No clubbing, cyanosis, edema.  Radials/DP/PT 2+ and equal bilaterally.  Respiratory:  Respirations regular and unlabored, clear to auscultation bilaterally. GI: Soft, nontender, nondistended, BS + x 4. MS: no deformity or atrophy. Skin: warm and dry, no rash. Neuro:  Strength and sensation are intact. Psych: AAOx3.  Normal affect.  Ambulating with physical therapy.   Labs    Chemistry Recent Labs  Lab 12/25/18 1441 12/26/18 0558  NA 138 139  K 4.0 4.3  CL 103 105  CO2 25 23  GLUCOSE 148* 153*  BUN 26* 23  CREATININE 2.04* 1.90*  CALCIUM 9.2 8.5*  PROT 7.3 6.2*  ALBUMIN 3.8 3.5  AST 36 27  ALT 27 22  ALKPHOS 92 76  BILITOT 0.8 0.8  GFRNONAA 29* 32*  GFRAA 34* 37*  ANIONGAP 10 11     Hematology Recent Labs  Lab 12/25/18 1441 12/26/18 0558  WBC 8.6 7.2  RBC 4.43 4.19*  HGB 13.6 13.0  HCT 40.4 38.3*  MCV 91.2 91.4  MCH 30.7 31.0  MCHC 33.7 33.9  RDW 14.1 14.1  PLT 189 176    Cardiac Enzymes Recent Labs  Lab 12/25/18 1441 12/25/18 1804 12/25/18 2240 12/26/18 0558  TROPONINI 3.05* 3.18* 3.78* 3.24*   No results for input(s): TROPIPOC in the last 168 hours.   BNPNo results for input(s): BNP, PROBNP in the last 168 hours.   DDimer No results for input(s): DDIMER in the last 168 hours.   Radiology    Ct Head Wo Contrast  Result Date: 12/25/2018 CLINICAL DATA:  Slurred speech, facial droop EXAM: CT HEAD WITHOUT CONTRAST TECHNIQUE: Contiguous axial images were obtained from  the base of the skull through the vertex without intravenous contrast. COMPARISON:  Report 01/13/2002 FINDINGS: Brain: No acute territorial infarction, hemorrhage or intracranial mass. Moderate atrophy. Mild small vessel ischemic changes of the white matter. Chronic appearing lacunar infarct within the left basal ganglia and white matter. Mildly prominent ventricles, felt secondary to atrophy. Vascular: No hyperdense vessels.  Carotid vascular calcification Skull: Normal. Negative for fracture or focal lesion. Sinuses/Orbits: Mild mucosal thickening in the ethmoid sinuses. Other: None IMPRESSION: 1. No CT evidence for acute intracranial abnormality. 2. Atrophy and mild small vessel ischemic changes of the white matter Electronically Signed   By: Donavan Foil M.D.   On: 12/25/2018 15:29   Dg Chest Port 1 View  Result Date: 12/25/2018 CLINICAL DATA:   Chest pain.  History of sleep apnea and CABG. EXAM: PORTABLE CHEST 1 VIEW COMPARISON:  Radiographs 06/09/2016 and 08/29/2015. FINDINGS: 1434 hours. Left subclavian AICD leads appear unchanged. The heart size and mediastinal contours are stable post median sternotomy and CABG. There are lower lung volumes with mildly increased atelectasis at both lung bases. No edema, confluent airspace opacity, pleural effusion or pneumothorax. The bones appear unchanged. Telemetry leads overlie the chest. IMPRESSION: Slightly lower lung volumes with resulting mild bibasilar atelectasis. Otherwise stable postoperative chest without acute findings. Electronically Signed   By: Richardean Sale M.D.   On: 12/25/2018 15:13   Ct Angio Chest/abd/pel For Dissection W And/or Wo Contrast  Result Date: 12/25/2018 CLINICAL DATA:  Transient altered mental status and dysphasia earlier today. EXAM: CT ANGIOGRAPHY CHEST, ABDOMEN AND PELVIS TECHNIQUE: Multidetector CT imaging through the chest, abdomen and pelvis was performed using the standard protocol during bolus administration of intravenous contrast. Multiplanar reconstructed images and MIPs were obtained and reviewed to evaluate the vascular anatomy. CONTRAST:  6mL OMNIPAQUE IOHEXOL 300 MG/ML  SOLN COMPARISON:  Chest radiographs today and 06/09/2016. FINDINGS: CTA CHEST FINDINGS Cardiovascular: Pre contrast images demonstrate atherosclerosis of the aorta, great vessels and coronary arteries status post median sternotomy and CABG. There are no displaced intimal calcifications. Following contrast, the aortic lumen opacifies normally. There is no evidence of aneurysm or dissection. There is suboptimal opacification of the pulmonary arteries, but no evidence of pulmonary embolism. Patient has a pacemaker. The heart is mildly enlarged. No significant pericardial effusion. Mediastinum/Nodes: There are mildly enlarged mediastinal lymph nodes, including an 11 mm right paratracheal node on image  27/8 and subcarinal nodes measuring 13 mm on image 63/8 and 13 mm on image 69/8. No hilar or axillary lymphadenopathy. The thyroid gland, trachea and esophagus demonstrate no significant findings. Lungs/Pleura: There is no pleural effusion or pneumothorax. Mild subpleural reticulation is present within both lungs which appears chronic. There is mild atelectasis at the right lung base. No confluent airspace opacity or suspicious pulmonary nodule. Musculoskeletal/Chest wall: There is no chest wall mass or suspicious osseous finding. There is nonunion of the median sternotomy, although the sternotomy wires are intact. Review of the MIP images confirms the above findings. CTA ABDOMEN AND PELVIS FINDINGS VASCULAR Aorta: Mild atherosclerosis without evidence of aneurysm, dissection or stenosis. Celiac: Mild ostial stenosis. Otherwise widely patent with conventional anatomy. SMA: Proximal atherosclerosis without stenosis. Renals: Atherosclerosis without stenosis. IMA: Patent. Inflow: Mild atherosclerosis without stenosis. Veins: Limited opacification.  No suggested acute findings. Review of the MIP images confirms the above findings. NON-VASCULAR Hepatobiliary: No focal hepatic abnormalities or abnormal enhancement. There are small gallstones. No evidence of gallbladder wall thickening or biliary dilatation. Pancreas: Unremarkable. No evidence of pancreatic mass, ductal  dilatation or surrounding inflammation. Spleen: Normal in size without focal abnormality. Adrenals/Urinary Tract: Both adrenal glands appear normal. Both kidneys demonstrate mild cortical thinning, but no abnormal enhancement or calculi. There is no hydronephrosis. The bladder appears unremarkable. Stomach/Bowel: No evidence of bowel wall thickening, distention or surrounding inflammation. The appendix appears normal. Mild sigmoid diverticulosis. Lymphatic: There are no enlarged abdominopelvic lymph nodes. Reproductive: The prostate gland and seminal  vesicles appear normal. Other: Small left inguinal hernia containing only fat. No ascites. Postsurgical changes in the anterior abdominal wall. Musculoskeletal: No acute or suspicious osseous findings. There is multilevel thoracolumbar scoliosis. There is a sclerotic lesion laterally in the left 8th rib which is likely an incidental bone island. Review of the MIP images confirms the above findings. IMPRESSION: 1. No acute vascular findings. There is aortic and branch vessel atherosclerosis with mild ostial stenosis of the celiac trunk. No large vessel occlusion, aneurysm or dissection. 2. No evidence of acute pulmonary embolism. 3. Small mediastinal lymph nodes, likely reactive. 4. Cholelithiasis without evidence of cholecystitis. 5. Postsurgical changes as described. Electronically Signed   By: Richardean Sale M.D.   On: 12/25/2018 17:43   Telemetry    Atrial pacing, left bundle branch block- Personally Reviewed  ECG    Atrial pacing, left bundle branch block- Personally Reviewed  Cardiac Studies   Echocardiogram 10/13/2018: 1. The left ventricle has mildly reduced systolic function, with an ejection fraction of 45-50%. Left ventricular diastolic Doppler parameters are consistent with impaired relaxation. 2. The mitral valve is normal in structure. No evidence of mitral valve stenosis. 3. The tricuspid valve is normal in structure. 4. The aortic valve is tricuspid There is mild thickening and mild calcification of the aortic valve. 5. The aortic root is normal in size and structure. 6. There is mild dilatation of the ascending aorta measuring 36 mm.  Preliminary on today's echocardiogram appears very similar to as above with 45 to 50% EF, Definity contrast.  Patient Profile     85 y.o. male with a hx of chronic systolic heart failure who is being seen for the evaluation of elevated troponin at the request of Dr. Sedonia Small.  Altered mental status and possible stroke was the reason he came in  today.  Assessment & Plan    1. Elevated troponin in the setting of possibe transient ischemic attack: Coronary artery disease post CABG -Patient presented with altered mental status and possible stroke, appreciate neurology.  Does not appear to have any residual neurologic findings at this time.  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 -CT with no evidence for acute intracranial abnormality>> plans to repeat head CT to assess for possible small stroke not visible on initial CT per neurology -Found to have an elevated troponin at 3.05, 3.18, 3.78, 3.24 with no EKG changes and no complaints of anginal symptoms.  This is a bit baffling.  Occasionally with neurologic situations such as stroke, troponin can be elevated.  Could this be related.  Continues to deny any cardiac symptoms such as chest pain.  I would not proceed with any invasive management at this time unless symptoms arise.  Continue with aggressive secondary risk factor prevention. -Echocardiogram, preliminarily appears unchanged from prior.  EF likely in the 50% range.  Definity contrast. -ASA increased to 325 mg daily per neurology  -Continue high intensity statin, atorvastatin 40 mg daily -No PE on CT.  2.  Chronic kidney disease stage  III: -Creatinine, 1.90 today which appears to be improved from baseline of 2.1- 2.5 -Continue to avoid nephrotoxic medications and monitor closely  3.  Hyperlipidemia: -Last LDL, 35 on 12/26/2018 -Continue high intensity atorvastatin 40 mg daily  4.  History of CAD status post CABG: -Continue with secondary risk factor prevention -No anginal symptoms on presentation  5.  Status post ICD placement: -Follows with Dr. Lovena Le -Last remote device check 10/24/2018 with appropriate functioning, leads a battery stable per chart review.   Signed, Candee Furbish, MD  12/26/2018, 11:50 AM     For questions or updates, please contact    Please consult [email or web address] for contact info under Cardiology/STEMI.

## 2018-12-26 NOTE — Progress Notes (Signed)
STROKE TEAM PROGRESS NOTE   INTERVAL HISTORY Patient states his numbness has nearly completely improved except for minimal residual numbness in the ulnar aspect of the left hand.  He denies any ongoing chest pain.  He has no new complaints.  Vitals:   12/26/18 0434 12/26/18 0800 12/26/18 1203 12/26/18 1243  BP: (!) 120/59 (!) 142/72 (!) 172/95 (!) 149/74  Pulse: 63 60 61   Resp: 17 18 18    Temp: 97.9 F (36.6 C) 97.9 F (36.6 C) (!) 97.5 F (36.4 C)   TempSrc: Oral Oral Oral   SpO2: 95% 96% 99%   Weight:      Height:        CBC:  Recent Labs  Lab 12/25/18 1441 12/26/18 0558  WBC 8.6 7.2  HGB 13.6 13.0  HCT 40.4 38.3*  MCV 91.2 91.4  PLT 189 127    Basic Metabolic Panel:  Recent Labs  Lab 12/25/18 1441 12/26/18 0558  NA 138 139  K 4.0 4.3  CL 103 105  CO2 25 23  GLUCOSE 148* 153*  BUN 26* 23  CREATININE 2.04* 1.90*  CALCIUM 9.2 8.5*   Lipid Panel:     Component Value Date/Time   CHOL 100 12/26/2018 0558   CHOL 121 07/19/2018 0832   TRIG 179 (H) 12/26/2018 0558   HDL 29 (L) 12/26/2018 0558   HDL 35 (L) 07/19/2018 0832   CHOLHDL 3.4 12/26/2018 0558   VLDL 36 12/26/2018 0558   LDLCALC 35 12/26/2018 0558   LDLCALC 45 07/19/2018 0832   HgbA1c:  Lab Results  Component Value Date   HGBA1C 7.3 (H) 12/26/2018   IMAGING Ct Head Wo Contrast  Result Date: 12/25/2018 CLINICAL DATA:  Slurred speech, facial droop EXAM: CT HEAD WITHOUT CONTRAST TECHNIQUE: Contiguous axial images were obtained from the base of the skull through the vertex without intravenous contrast. COMPARISON:  Report 01/13/2002 FINDINGS: Brain: No acute territorial infarction, hemorrhage or intracranial mass. Moderate atrophy. Mild small vessel ischemic changes of the white matter. Chronic appearing lacunar infarct within the left basal ganglia and white matter. Mildly prominent ventricles, felt secondary to atrophy. Vascular: No hyperdense vessels.  Carotid vascular calcification Skull: Normal.  Negative for fracture or focal lesion. Sinuses/Orbits: Mild mucosal thickening in the ethmoid sinuses. Other: None IMPRESSION: 1. No CT evidence for acute intracranial abnormality. 2. Atrophy and mild small vessel ischemic changes of the white matter Electronically Signed   By: Donavan Foil M.D.   On: 12/25/2018 15:29   Dg Chest Port 1 View  Result Date: 12/25/2018 CLINICAL DATA:  Chest pain.  History of sleep apnea and CABG. EXAM: PORTABLE CHEST 1 VIEW COMPARISON:  Radiographs 06/09/2016 and 08/29/2015. FINDINGS: 1434 hours. Left subclavian AICD leads appear unchanged. The heart size and mediastinal contours are stable post median sternotomy and CABG. There are lower lung volumes with mildly increased atelectasis at both lung bases. No edema, confluent airspace opacity, pleural effusion or pneumothorax. The bones appear unchanged. Telemetry leads overlie the chest. IMPRESSION: Slightly lower lung volumes with resulting mild bibasilar atelectasis. Otherwise stable postoperative chest without acute findings. Electronically Signed   By: Richardean Sale M.D.   On: 12/25/2018 15:13   Ct Angio Chest/abd/pel For Dissection W And/or Wo Contrast  Result Date: 12/25/2018 CLINICAL DATA:  Transient altered mental status and dysphasia earlier today. EXAM: CT ANGIOGRAPHY CHEST, ABDOMEN AND PELVIS TECHNIQUE: Multidetector CT imaging through the chest, abdomen and pelvis was performed using the standard protocol during bolus administration of intravenous contrast.  Multiplanar reconstructed images and MIPs were obtained and reviewed to evaluate the vascular anatomy. CONTRAST:  44mL OMNIPAQUE IOHEXOL 300 MG/ML  SOLN COMPARISON:  Chest radiographs today and 06/09/2016. FINDINGS: CTA CHEST FINDINGS Cardiovascular: Pre contrast images demonstrate atherosclerosis of the aorta, great vessels and coronary arteries status post median sternotomy and CABG. There are no displaced intimal calcifications. Following contrast, the aortic  lumen opacifies normally. There is no evidence of aneurysm or dissection. There is suboptimal opacification of the pulmonary arteries, but no evidence of pulmonary embolism. Patient has a pacemaker. The heart is mildly enlarged. No significant pericardial effusion. Mediastinum/Nodes: There are mildly enlarged mediastinal lymph nodes, including an 11 mm right paratracheal node on image 27/8 and subcarinal nodes measuring 13 mm on image 63/8 and 13 mm on image 69/8. No hilar or axillary lymphadenopathy. The thyroid gland, trachea and esophagus demonstrate no significant findings. Lungs/Pleura: There is no pleural effusion or pneumothorax. Mild subpleural reticulation is present within both lungs which appears chronic. There is mild atelectasis at the right lung base. No confluent airspace opacity or suspicious pulmonary nodule. Musculoskeletal/Chest wall: There is no chest wall mass or suspicious osseous finding. There is nonunion of the median sternotomy, although the sternotomy wires are intact. Review of the MIP images confirms the above findings. CTA ABDOMEN AND PELVIS FINDINGS VASCULAR Aorta: Mild atherosclerosis without evidence of aneurysm, dissection or stenosis. Celiac: Mild ostial stenosis. Otherwise widely patent with conventional anatomy. SMA: Proximal atherosclerosis without stenosis. Renals: Atherosclerosis without stenosis. IMA: Patent. Inflow: Mild atherosclerosis without stenosis. Veins: Limited opacification.  No suggested acute findings. Review of the MIP images confirms the above findings. NON-VASCULAR Hepatobiliary: No focal hepatic abnormalities or abnormal enhancement. There are small gallstones. No evidence of gallbladder wall thickening or biliary dilatation. Pancreas: Unremarkable. No evidence of pancreatic mass, ductal dilatation or surrounding inflammation. Spleen: Normal in size without focal abnormality. Adrenals/Urinary Tract: Both adrenal glands appear normal. Both kidneys demonstrate  mild cortical thinning, but no abnormal enhancement or calculi. There is no hydronephrosis. The bladder appears unremarkable. Stomach/Bowel: No evidence of bowel wall thickening, distention or surrounding inflammation. The appendix appears normal. Mild sigmoid diverticulosis. Lymphatic: There are no enlarged abdominopelvic lymph nodes. Reproductive: The prostate gland and seminal vesicles appear normal. Other: Small left inguinal hernia containing only fat. No ascites. Postsurgical changes in the anterior abdominal wall. Musculoskeletal: No acute or suspicious osseous findings. There is multilevel thoracolumbar scoliosis. There is a sclerotic lesion laterally in the left 8th rib which is likely an incidental bone island. Review of the MIP images confirms the above findings. IMPRESSION: 1. No acute vascular findings. There is aortic and branch vessel atherosclerosis with mild ostial stenosis of the celiac trunk. No large vessel occlusion, aneurysm or dissection. 2. No evidence of acute pulmonary embolism. 3. Small mediastinal lymph nodes, likely reactive. 4. Cholelithiasis without evidence of cholecystitis. 5. Postsurgical changes as described. Electronically Signed   By: Richardean Sale M.D.   On: 12/25/2018 17:43   2D echo with bubble pending  PHYSICAL EXAM Pleasant obese elderly male not in distress  . Afebrile. Head is nontraumatic. Neck is supple without bruit.    Cardiac exam no murmur or gallop. Lungs are clear to auscultation. Distal pulses are well felt. Neurological Exam ;  Awake  Alert oriented x 3. Normal speech and language.eye movements full without nystagmus.fundi were not visualized. Vision acuity and fields appear normal. Hearing is normal. Palatal movements are normal. Face symmetric. Tongue midline. Normal strength, tone, reflexes and coordination. Normal sensation.  Gait deferred.  ASSESSMENT/PLAN Manuel Kline is a 85 y.o. 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   Right brain TIA  CT head No acute stroke. Small vessel disease. Atrophy.   CTA chest no dissection.  Small mediastinal lymph nodes likely 1-39% biltateral carotid stenosis   2D Echo with bubble  pending  LDL 35  HgbA1c 7.3  SCDs for VTE prophylaxis  aspirin 81 mg daily prior to admission, now on aspirin 325 mg daily. Given TIA, recommend aspirin 81 mg and plavix 75 mg daily x 3 weeks, then PLAVIX alone. Orders adjusted.   Therapy recommendations: HH PT  Disposition: Return home with family  Follow-up in the stroke clinic in 4 weeks.  Order placed  Hypertension   Stable . Permissive hypertension (OK if < 220/120) but gradually normalize in 5-7 days . Long-term BP goal normotensive  Hyperlipidemia  Home meds: Lipitor 80 and fish oil  LDL 35, at goal < 70  Lipitor decreased to 40 mg daily  Continue statin at discharge  Diabetes type II Uncontrolled Diabetic neuropathy  HgbA1c 7.3, goal < 7.0  Other Stroke Risk Factors  Advanced age  Morbid obesity, Body mass index is 45.68 kg/m., recommend weight loss, diet and exercise as appropriate   Coronary artery disease s/p CABG  Obstructive sleep apnea  Cardiomyopathy with ICD  Other Active Problems  Chronic kidney disease stage IV  History of gout  Hospital day # 0  I have personally obtained history,examined this patient, reviewed notes, independently viewed imaging studies, participated in medical decision making and plan of care.ROS completed by me personally and pertinent positives fully documented  I have made any additions or clarifications directly to the above note.  He presented with left upper extremity numbness which appears to have resolved almost completely and likely had a TIA.  Check CT angiogram of the brain and neck.  Recommend  aspirin and Plavix given his history of acute MI.  Aggressive risk factor modification.  Discussed with patient and Dr. Eliseo Squires.  Greater than 50% time during this 35-minute visit was spent on counseling and coordination of care about TIA and discussion about stroke prevention and treatment and answering questions.  Manuel Contras, MD Medical Director [facility] Pager: [phone] 12/26/2018 3:50 PM   To contact Stroke Continuity provider, please refer to [email or web address] After hours, contact General Neurology

## 2018-12-26 NOTE — Evaluation (Signed)
Occupational Therapy Evaluation and Discharge Patient Details Name: Manuel Kline MRN: [id number] DOB: 24-Jan-1932 Today's Date: 12/26/2018    History of Present Illness Manuel Kline  is a 85 y.o. male,  w CAD s/p CABG, CHF (EF 45%), h/o ICD, Dm2, Neuropathy, apparently was at church sitting in car when he was noted to have AMS and syncope, He may have had some "facial droop, slurred speech, and left sided weakness" per report. CT-negative, tropin noted to be up on admission-started trending down this AM   Clinical Impression   This 85 yo male admitted with above presents to acute OT at a S level and has this at home. No further OT needs noted, we will sign off.    Follow Up Recommendations  No OT follow up;Supervision - Intermittent          Precautions / Restrictions Precautions Precautions: Fall Precaution Comments: Pt reports the last time he fell was about 1 1/2 years ago      Mobility Bed Mobility Overal bed mobility: Independent                Transfers Overall transfer level: Needs assistance Equipment used: None Transfers: Sit to/from Stand Sit to Stand: Supervision         General transfer comment: ambulation in hallways S    Balance Overall balance assessment: Mild deficits observed, not formally tested                                         ADL either performed or assessed with clinical judgement   ADL                                         General ADL Comments: Overall at a S level due to stating he feels weak in legs and hips.     Vision Patient Visual Report: No change from baseline              Pertinent Vitals/Pain Pain Assessment: No/denies pain     Hand Dominance Right   Extremity/Trunk Assessment Upper Extremity Assessment Upper Extremity Assessment: Overall WFL for tasks assessed   Lower Extremity Assessment Lower Extremity Assessment: (pt reports his legs and hips feel weak--"it might be my  age getting to me")       Communication Communication Communication: No difficulties   Cognition Arousal/Alertness: Awake/alert Behavior During Therapy: WFL for tasks assessed/performed Overall Cognitive Status: Within Functional Limits for tasks assessed                                                Home Living Family/patient expects to be discharged to:: Private residence Living Arrangements: Spouse/significant other Available Help at Discharge: Family;Available 24 hours/day Type of Home: House Home Access: Stairs to enter [employer] of Steps: 1 Entrance Stairs-Rails: None Home Layout: One level     Bathroom Shower/Tub: Occupational psychologist: Standard     Home Equipment: None          Prior Functioning/Environment Level of Independence: Independent        Comments: Was planting a garden the day before admission  OT Problem List: Impaired balance (sitting and/or standing)         OT Goals(Current goals can be found in the care plan section) Acute Rehab OT Goals Patient Stated Goal: to figure out if anything is going on  OT Frequency:                AM-PAC OT "6 Clicks" Daily Activity     Outcome Measure Help from another person eating meals?: None Help from another person taking care of personal grooming?: None Help from another person toileting, which includes using toliet, bedpan, or urinal?: None Help from another person bathing (including washing, rinsing, drying)?: None Help from another person to put on and taking off regular upper body clothing?: None Help from another person to put on and taking off regular lower body clothing?: None 6 Click Score: 24   End of Session Equipment Utilized During Treatment: Gait belt Nurse Communication: Mobility status(NT)  Activity Tolerance: Patient tolerated treatment well Patient left: in chair;with call bell/phone within reach;with chair alarm set  OT  Visit Diagnosis: Unsteadiness on feet (R26.81)                Time: 3716-9678 OT Time Calculation (min): 20 min Charges:  OT General Charges $OT Visit: 1 Visit OT Evaluation $OT Eval Moderate Complexity: 1 Mod  Golden Circle, OTR/L Acute [employer] Pager [phone] Office [phone]     Almon Register 12/26/2018, 9:31 AM

## 2018-12-26 NOTE — Evaluation (Signed)
Physical Therapy Evaluation Patient Details Name: Manuel Kline MRN: [id number] DOB: 07-13-1932 Today's Date: 12/26/2018   History of Present Illness  Manuel Kline  is a 85 y.o. male,  w CAD s/p CABG, CHF (EF 45%), h/o ICD, Dm2, Neuropathy, apparently was at church sitting in car when he was noted to have AMS and syncope, He may have had some "facial droop, slurred speech, and left sided weakness" per report. CT-negative, tropin noted to be up on admission-started trending down this AM    Clinical Impression  Patient admitted with the above listed diagnosis. Patient reports Mod I at baseline, however, has had 3 falls in the past year. Ambulating with mild instability in hallway with tendency to reach for objects/handrails for addition stability. Patient also reports reduced sensation at B LE likely contributing to reduced balance. Discussion with patient regarding use of RW (as he has this available to him at home) for increased stability with patient stating, "It's hard to use a walker out plowing fields." Will recommend HHPT at discharge to ensure safety within the home environment. PT to follow acutely.     Follow Up Recommendations Home health PT;Supervision - Intermittent    Equipment Recommendations  None recommended by PT    Recommendations for Other Services       Precautions / Restrictions Precautions Precautions: Fall Precaution Comments: reports he has had 3 falls in the past year Restrictions Weight Bearing Restrictions: No      Mobility  Bed Mobility Overal bed mobility: Modified Independent             General bed mobility comments: increased time and effort  Transfers Overall transfer level: Needs assistance Equipment used: None Transfers: Sit to/from Stand Sit to Stand: Min guard         General transfer comment: for safety and immediate standing balance  Ambulation/Gait Ambulation/Gait assistance: Min guard Gait Distance (Feet): 200 Feet Assistive  device: None(use of handrails in hallway) Gait Pattern/deviations: Step-to pattern;Decreased stride length;Shuffle;Trunk flexed Gait velocity: decreased   General Gait Details: tends to look at floor; reaches for objects for stability  Stairs Stairs: Yes Stairs assistance: Min guard Stair Management: Two rails;Step to pattern;Forwards Number of Stairs: 3(x2)    Wheelchair Mobility    Modified Rankin (Stroke Patients Only)       Balance Overall balance assessment: Mild deficits observed, not formally tested                                           Pertinent Vitals/Pain Pain Assessment: No/denies pain    Home Living Family/patient expects to be discharged to:: Private residence Living Arrangements: Spouse/significant other Available Help at Discharge: Family;Available 24 hours/day Type of Home: House Home Access: Stairs to enter Entrance Stairs-Rails: None Entrance Stairs-Number of Steps: 1 Home Layout: One level Home Equipment: Environmental consultant - 2 wheels      Prior Function Level of Independence: Independent         Comments: Was planting a garden the day before admission     Hand Dominance   Dominant Hand: Right    Extremity/Trunk Assessment   Upper Extremity Assessment Upper Extremity Assessment: Defer to OT evaluation    Lower Extremity Assessment Lower Extremity Assessment: Generalized weakness    Cervical / Trunk Assessment Cervical / Trunk Assessment: Normal  Communication   Communication: No difficulties  Cognition Arousal/Alertness: Awake/alert Behavior During Therapy:  WFL for tasks assessed/performed Overall Cognitive Status: Within Functional Limits for tasks assessed                                        General Comments      Exercises     Assessment/Plan    PT Assessment Patient needs continued PT services  PT Problem List Decreased strength;Decreased activity tolerance;Decreased balance;Decreased  mobility;Decreased knowledge of use of DME;Decreased safety awareness       PT Treatment Interventions DME instruction;Gait training;Stair training;Functional mobility training;Therapeutic activities;Therapeutic exercise;Balance training;Neuromuscular re-education;Patient/family education    PT Goals (Current goals can be found in the Care Plan section)  Acute Rehab PT Goals Patient Stated Goal: to figure out if anything is going on PT Goal Formulation: With patient Time For Goal Achievement: 01/02/19 Potential to Achieve Goals: Good    Frequency Min 3X/week   Barriers to discharge        Co-evaluation               AM-PAC PT "6 Clicks" Mobility  Outcome Measure Help needed turning from your back to your side while in a flat bed without using bedrails?: A Little Help needed moving from lying on your back to sitting on the side of a flat bed without using bedrails?: A Little Help needed moving to and from a bed to a chair (including a wheelchair)?: A Little Help needed standing up from a chair using your arms (e.g., wheelchair or bedside chair)?: A Little Help needed to walk in hospital room?: A Little Help needed climbing 3-5 steps with a railing? : A Little 6 Click Score: 18    End of Session Equipment Utilized During Treatment: Gait belt Activity Tolerance: Patient tolerated treatment well Patient left: in bed;with call bell/phone within reach Nurse Communication: Mobility status PT Visit Diagnosis: Unsteadiness on feet (R26.81);Other abnormalities of gait and mobility (R26.89);Muscle weakness (generalized) (M62.81);History of falling (Z91.81)    Time: 1140-1156 PT Time Calculation (min) (ACUTE ONLY): 16 min   Charges:   PT Evaluation $PT Eval Moderate Complexity: 1 Mod           Lanney Gins, PT, DPT Supplemental Physical Therapist 12/26/18 12:42 PM Pager: [phone] Office: [phone]

## 2018-12-26 NOTE — Consult Note (Signed)
NEURO HOSPITALIST CONSULT NOTE   Requestig physician: Dr. Maudie Mercury  Reason for Consult: Transient LUE weakness  History obtained from:    Patient and Chart     HPI:                                                                                                                                          Manuel Kline is an 85 y.o. male who presented to [facility] with AMS from his [place]. LKN was 1224 on Sunday. On arrival he was alert and fully oriented. His daughter-in-law stated her husband called her and told her that his father "was not himself", not oriented, but did not lose consciousness. He may have had some "facial droop, slurred speech, and left sided weakness" per report. The patient himself states that he fainted during the church service and when he woke up people were standing around him.   He denies any neurological symptoms at this time including no vision loss, facial weakness, headache, neck pain, limb weakness or limb numbness. Also denies CP, SOB, cough or fever.   His PMHx includes CAD, cardiomyopathy, DM, HLD, HTN, implantable cardiac defibrillator, metabolic syndrome, CABG and sleep apnea.   Home meds include ASA 81 and atorvastatin.   Past Medical History:  Diagnosis Date  . Adenomatous colon polyp 2006  . CAD (coronary artery disease)   . Calcium oxalate renal stones   . Cardiomyopathy   . Cataract   . Diabetes ([place])   . Erectile dysfunction   . Erosive esophagitis   . Hemorrhoids   . HLD (hyperlipidemia)   . HTN (hypertension)   . Hypertensive heart disease without CHF 07/31/2011  . ICD (implantable cardiac defibrillator) in place   . ICD dual chamber in situ   . Metabolic syndrome   . Morbid obesity ([place])   . Osteoarthritis   . S/P CABG (coronary artery bypass graft) 11/02/2000  . Sleep apnea     Past Surgical History:  Procedure Laterality Date  . ABDOMINAL EXPLORATION SURGERY    . BACK SURGERY     X'3  . cardiac bypass    .  CARDIAC DEFIBRILLATOR PLACEMENT    . CARPAL TUNNEL RELEASE     X2, bilateral  . CATARACT EXTRACTION    . COLONOSCOPY  06/20/2012   Procedure: COLONOSCOPY;  Surgeon: Sable Feil, MD;  Location: [facility];  Service: Endoscopy;  Laterality: N/A;  . DOPPLER ECHOCARDIOGRAPHY  2003  . EP IMPLANTABLE DEVICE N/A 07/14/2016   Procedure: ICD Generator Changeout;  Surgeon: Evans Lance, MD;  Location: [facility];  Service: Cardiovascular;  Laterality: N/A;  . ESOPHAGOGASTRODUODENOSCOPY  06/20/2012   Procedure: ESOPHAGOGASTRODUODENOSCOPY (EGD);  Surgeon: Sable Feil, MD;  Location: [facility];  Service: Endoscopy;  Laterality: N/A;  . EYE SURGERY    . LAPAROTOMY    . RETINOPATHY SURGERY Bilateral   . rotator cuff surgery     left    Family History  Problem Relation Age of Onset  . Diabetes Brother   . Heart disease Father   . Heart disease Mother   . Diabetes Mother   . Diabetes Brother   . Diabetes Brother   . Cancer Brother        baldder  . Heart disease Brother   . Diabetes Sister   . Diabetes Sister   . Kidney disease Sister        dialysis  . Diabetes Sister   . Diabetes Sister   . Diabetes Sister   . Throat cancer Paternal Uncle               Social History:  reports that he has never smoked. He has never used smokeless tobacco. He reports that he does not drink alcohol or use drugs.  No Known Allergies  MEDICATIONS:                                                                                                                     Prior to Admission:  Medications Prior to Admission  Medication Sig Dispense Refill Last Dose  . allopurinol (ZYLOPRIM) 300 MG tablet TAKE 1/2 (ONE-HALF) TABLET BY MOUTH ONCE DAILY (Patient taking differently: Take 150 mg by mouth daily. ) 45 tablet 1 12/25/2018 at Unknown time  . aspirin 81 MG tablet Take 81 mg by mouth daily.    12/24/2018 at Unknown time  . atorvastatin (LIPITOR) 80 MG tablet Take 0.5 tablets (40 mg  total) by mouth daily. 45 tablet 0 12/24/2018 at Unknown time  . cloNIDine (CATAPRES) 0.1 MG tablet TAKE 1 TABLET BY MOUTH TWICE DAILY (Patient taking differently: Take 0.1 mg by mouth 2 (two) times daily. ) 180 tablet 1 12/25/2018 at Unknown time  . Cyanocobalamin (VITAMIN B 12 PO) Take 1,000 mcg by mouth daily.   12/24/2018 at Unknown time  . furosemide (LASIX) 40 MG tablet Take 1 tablet (40 mg total) by mouth 2 (two) times daily. 180 tablet 0 12/24/2018 at Unknown time  . gabapentin (NEURONTIN) 300 MG capsule Take 1 capsule (300 mg total) by mouth 2 (two) times daily. 180 capsule 1 12/24/2018 at Unknown time  . insulin NPH Human (HUMULIN N) 100 UNIT/ML injection INJECT 35 UNITS BID (Patient taking differently: Inject 35 Units into the skin 2 (two) times daily before a meal. ) 63 mL 3 12/25/2018 at Unknown time  . insulin regular (HUMULIN R) 100 units/mL injection Inject 0.3 mLs (30 Units total) into the skin 2 (two) times daily before a meal. 60 mL 3 12/25/2018 at Unknown time  . metoprolol succinate (TOPROL-XL) 50 MG 24 hr tablet Take 1 tablet (50 mg total) by mouth daily. 90 tablet 0 12/25/2018 at 0700  . nitroGLYCERIN (NITROSTAT) 0.4 MG SL tablet Place 0.4 mg under the tongue  every 5 (five) minutes as needed for chest pain.   unk  . Omega-3 Fatty Acids (FISH OIL) 1000 MG CPDR Take 1 tablet by mouth daily.   12/24/2018 at Unknown time  . polyethylene glycol powder (GLYCOLAX/MIRALAX) powder Take 17 g by mouth 2 (two) times daily as needed. 3350 g 1 12/24/2018 at Unknown time  . quinapril (ACCUPRIL) 40 MG tablet TAKE 1 TABLET BY MOUTH AT BEDTIME (Patient taking differently: Take 40 mg by mouth daily. ) 90 tablet 1 12/24/2018 at Unknown time  . glucose blood (ACCU-CHEK GUIDE) test strip 1 each by Other route 5 (five) times daily. Use as instructed 400 each 11   . glucose monitoring kit (FREESTYLE) monitoring kit 1 each by Does not apply route as needed for other. 1 each 1   . Insulin Pen Needle 29G X 12.7MM  MISC Use ti inject insulin 5 times a day 150 each 3    Scheduled: . [START ON 12/26/2018] allopurinol  150 mg Oral Daily  . aspirin  300 mg Rectal Daily   Or  . aspirin  325 mg Oral Daily  . [START ON 12/26/2018] atorvastatin  40 mg Oral Daily  . [START ON 12/26/2018] furosemide  40 mg Oral BID  . gabapentin  300 mg Oral BID  . hydrALAZINE  10 mg Oral Q8H  . insulin aspart  0-5 Units Subcutaneous QHS  . [START ON 12/26/2018] insulin aspart  0-9 Units Subcutaneous TID WC  . [START ON 12/26/2018] insulin aspart  15 Units Subcutaneous BID WC  . [START ON 12/26/2018] insulin NPH Human  20 Units Subcutaneous BID AC  . [START ON 12/26/2018] metoprolol succinate  50 mg Oral Daily     ROS:                                                                                                                                       No headache, vision loss, facial weakness or weakness in his RUE, RLE or LLE. No numbness. No CP, SOB or cough. Other ROS as per HPI with all other systems negative.    Blood pressure (!) 171/68, pulse 64, temperature 97.7 F (36.5 C), temperature source Oral, resp. rate 16, height 5' (1.524 m), weight 106.1 kg, SpO2 100 %.   General Examination:                                                                                                       Physical Exam  HEENT-  [place]/AT    Lungs- Respirations unlabored Extremities- No edema  Neurological Examination Mental Status: Alert, fully oriented, thought content appropriate.  Speech fluent with intact naming and comprehension of basic commands. Has difficulty with a 3 step directional command without difficulty. Errors noted when asked to repeat a phrase.  Cranial Nerves: II: Visual fields intact with no extinction to DSS. Pupils 1.5 mm bilaterally, round and symmetric. Difficult to ascertain pupillary reactivity.  III,IV, VI: No ptosis. EOMI without nystagmus.  V,VII: Smile symmetric. Facial light temp sensation equal  bilaterally VIII: Hearing intact to voice IX,X: Palate rises symmetrically XI: Symmetric shoulder shrug XII: midline tongue extension Motor: Right : Upper extremity   5/5    Left:     Upper extremity   5/5  Lower extremity   5/5     Lower extremity   5/5 No pronator drift.  Sensory: Temp and light touch intact x 4 without extinction Deep Tendon Reflexes: 1+ biceps and brachioradialis bilaterally. Does not relax BLE sufficiently for testing of patellar or achilles reflexes.   Cerebellar: No ataxia with FNF bilaterally  Gait: Deferred   Lab Results: Basic Metabolic Panel: Recent Labs  Lab 12/25/18 1441  NA 138  K 4.0  CL 103  CO2 25  GLUCOSE 148*  BUN 26*  CREATININE 2.04*  CALCIUM 9.2    CBC: Recent Labs  Lab 12/25/18 1441  WBC 8.6  HGB 13.6  HCT 40.4  MCV 91.2  PLT 189    Cardiac Enzymes: Recent Labs  Lab 12/25/18 1441 12/25/18 1804  TROPONINI 3.05* 3.18*    Lipid Panel: No results for input(s): CHOL, TRIG, HDL, CHOLHDL, VLDL, LDLCALC in the last 168 hours.  Imaging: Ct Head Wo Contrast  Result Date: 12/25/2018 CLINICAL DATA:  Slurred speech, facial droop EXAM: CT HEAD WITHOUT CONTRAST TECHNIQUE: Contiguous axial images were obtained from the base of the skull through the vertex without intravenous contrast. COMPARISON:  Report 01/13/2002 FINDINGS: Brain: No acute territorial infarction, hemorrhage or intracranial mass. Moderate atrophy. Mild small vessel ischemic changes of the white matter. Chronic appearing lacunar infarct within the left basal ganglia and white matter. Mildly prominent ventricles, felt secondary to atrophy. Vascular: No hyperdense vessels.  Carotid vascular calcification Skull: Normal. Negative for fracture or focal lesion. Sinuses/Orbits: Mild mucosal thickening in the ethmoid sinuses. Other: None IMPRESSION: 1. No CT evidence for acute intracranial abnormality. 2. Atrophy and mild small vessel ischemic changes of the white matter  Electronically Signed   By: Donavan Foil M.D.   On: 12/25/2018 15:29   Dg Chest Port 1 View  Result Date: 12/25/2018 CLINICAL DATA:  Chest pain.  History of sleep apnea and CABG. EXAM: PORTABLE CHEST 1 VIEW COMPARISON:  Radiographs 06/09/2016 and 08/29/2015. FINDINGS: 1434 hours. Left subclavian AICD leads appear unchanged. The heart size and mediastinal contours are stable post median sternotomy and CABG. There are lower lung volumes with mildly increased atelectasis at both lung bases. No edema, confluent airspace opacity, pleural effusion or pneumothorax. The bones appear unchanged. Telemetry leads overlie the chest. IMPRESSION: Slightly lower lung volumes with resulting mild bibasilar atelectasis. Otherwise stable postoperative chest without acute findings. Electronically Signed   By: Richardean Sale M.D.   On: 12/25/2018 15:13   Ct Angio Chest/abd/pel For Dissection W And/or Wo Contrast  Result Date: 12/25/2018 CLINICAL DATA:  Transient altered mental status and dysphasia earlier today. EXAM: CT ANGIOGRAPHY CHEST, ABDOMEN AND PELVIS TECHNIQUE: Multidetector CT imaging through the chest, abdomen and pelvis was performed using  the standard protocol during bolus administration of intravenous contrast. Multiplanar reconstructed images and MIPs were obtained and reviewed to evaluate the vascular anatomy. CONTRAST:  73m OMNIPAQUE IOHEXOL 300 MG/ML  SOLN COMPARISON:  Chest radiographs today and 06/09/2016. FINDINGS: CTA CHEST FINDINGS Cardiovascular: Pre contrast images demonstrate atherosclerosis of the aorta, great vessels and coronary arteries status post median sternotomy and CABG. There are no displaced intimal calcifications. Following contrast, the aortic lumen opacifies normally. There is no evidence of aneurysm or dissection. There is suboptimal opacification of the pulmonary arteries, but no evidence of pulmonary embolism. Patient has a pacemaker. The heart is mildly enlarged. No significant  pericardial effusion. Mediastinum/Nodes: There are mildly enlarged mediastinal lymph nodes, including an 11 mm right paratracheal node on image 27/8 and subcarinal nodes measuring 13 mm on image 63/8 and 13 mm on image 69/8. No hilar or axillary lymphadenopathy. The thyroid gland, trachea and esophagus demonstrate no significant findings. Lungs/Pleura: There is no pleural effusion or pneumothorax. Mild subpleural reticulation is present within both lungs which appears chronic. There is mild atelectasis at the right lung base. No confluent airspace opacity or suspicious pulmonary nodule. Musculoskeletal/Chest wall: There is no chest wall mass or suspicious osseous finding. There is nonunion of the median sternotomy, although the sternotomy wires are intact. Review of the MIP images confirms the above findings. CTA ABDOMEN AND PELVIS FINDINGS VASCULAR Aorta: Mild atherosclerosis without evidence of aneurysm, dissection or stenosis. Celiac: Mild ostial stenosis. Otherwise widely patent with conventional anatomy. SMA: Proximal atherosclerosis without stenosis. Renals: Atherosclerosis without stenosis. IMA: Patent. Inflow: Mild atherosclerosis without stenosis. Veins: Limited opacification.  No suggested acute findings. Review of the MIP images confirms the above findings. NON-VASCULAR Hepatobiliary: No focal hepatic abnormalities or abnormal enhancement. There are small gallstones. No evidence of gallbladder wall thickening or biliary dilatation. Pancreas: Unremarkable. No evidence of pancreatic mass, ductal dilatation or surrounding inflammation. Spleen: Normal in size without focal abnormality. Adrenals/Urinary Tract: Both adrenal glands appear normal. Both kidneys demonstrate mild cortical thinning, but no abnormal enhancement or calculi. There is no hydronephrosis. The bladder appears unremarkable. Stomach/Bowel: No evidence of bowel wall thickening, distention or surrounding inflammation. The appendix appears  normal. Mild sigmoid diverticulosis. Lymphatic: There are no enlarged abdominopelvic lymph nodes. Reproductive: The prostate gland and seminal vesicles appear normal. Other: Small left inguinal hernia containing only fat. No ascites. Postsurgical changes in the anterior abdominal wall. Musculoskeletal: No acute or suspicious osseous findings. There is multilevel thoracolumbar scoliosis. There is a sclerotic lesion laterally in the left 8th rib which is likely an incidental bone island. Review of the MIP images confirms the above findings. IMPRESSION: 1. No acute vascular findings. There is aortic and branch vessel atherosclerosis with mild ostial stenosis of the celiac trunk. No large vessel occlusion, aneurysm or dissection. 2. No evidence of acute pulmonary embolism. 3. Small mediastinal lymph nodes, likely reactive. 4. Cholelithiasis without evidence of cholecystitis. 5. Postsurgical changes as described. Electronically Signed   By: WRichardean SaleM.D.   On: 12/25/2018 17:43    Assessment: 85year old male with acute onset of syncope followed by possible left sided weakness transiently.  1. CT head shows no acute intracranial abnormality. Atrophy and mild small vessel ischemic changes of the white matter are noted. 2. Neurological exam shows no lateralized deficit.   Recommendations: 1. After sufficient time has elapsed due to recent contrast bolus in the context of renal dysfunction, obtain a CTA of head and neck. 2. TTE. 3.  Unable to perform MRI  due to ICD. Repeat CT head in 3 days to assess for possible small stroke not visible on initial CT head.  4. Stroke admission order set.  5. Agree with increasing ASA to 325 mg  6. Continue atorvastatin   Electronically signed: Dr. Kerney Elbe 12/25/2018, 10:02 PM

## 2018-12-26 NOTE — Evaluation (Signed)
Speech Language Pathology Evaluation Patient Details Name: Manuel Kline MRN: [id number] DOB: Aug 04, 1932 Today's Date: 12/26/2018 Time: 1000-1025 SLP Time Calculation (min) (ACUTE ONLY): 25 min  Problem List:  Patient Active Problem List   Diagnosis Date Noted  . Stroke-like symptoms 12/26/2018  . Left-sided weakness 12/25/2018  . Elevated troponin 12/25/2018  . CKD (chronic kidney disease) stage 4, GFR 15-29 ml/min (HCC) 11/08/2017  . Acute on chronic systolic heart failure, NYHA class 2 ([place]) 11/08/2017  . Presence of automatic (implantable) cardiac defibrillator 11/08/2017  . Ventricular tachycardia (paroxysmal) ([place]) 07/14/2016  . Essential hypertension 11/30/2015  . Diabetic peripheral neuropathy ([place]) 01/31/2015  . OSA on CPAP 11/05/2014  . Severe obesity (BMI >= 40) ([place]) 11/05/2014  . CAD (coronary artery disease)   . Dual implantable cardioverter-defibrillator in situ   . Syncope 08/06/2009  . Hyperlipidemia   . Type 2 diabetes, uncontrolled, with neuropathy ([place])   . Morbid obesity ([place])   . Ischemic cardiomyopathy   . Chronic systolic heart failure ([place])   . S/P CABG (coronary artery bypass graft) 11/02/2000   Past Medical History:  Past Medical History:  Diagnosis Date  . Adenomatous colon polyp 2006  . CAD (coronary artery disease)   . Calcium oxalate renal stones   . Cardiomyopathy   . Cataract   . Diabetes ([place])   . Erectile dysfunction   . Erosive esophagitis   . Hemorrhoids   . HLD (hyperlipidemia)   . HTN (hypertension)   . Hypertensive heart disease without CHF 07/31/2011  . ICD (implantable cardiac defibrillator) in place   . ICD dual chamber in situ   . Metabolic syndrome   . Morbid obesity ([place])   . Osteoarthritis   . S/P CABG (coronary artery bypass graft) 11/02/2000  . Sleep apnea    Past Surgical History:  Past Surgical History:  Procedure Laterality Date  . ABDOMINAL EXPLORATION SURGERY    . BACK SURGERY     X'3  . cardiac bypass    .  CARDIAC DEFIBRILLATOR PLACEMENT    . CARPAL TUNNEL RELEASE     X2, bilateral  . CATARACT EXTRACTION    . COLONOSCOPY  06/20/2012   Procedure: COLONOSCOPY;  Surgeon: Sable Feil, MD;  Location: [facility];  Service: Endoscopy;  Laterality: N/A;  . DOPPLER ECHOCARDIOGRAPHY  2003  . EP IMPLANTABLE DEVICE N/A 07/14/2016   Procedure: ICD Generator Changeout;  Surgeon: Evans Lance, MD;  Location: [facility];  Service: Cardiovascular;  Laterality: N/A;  . ESOPHAGOGASTRODUODENOSCOPY  06/20/2012   Procedure: ESOPHAGOGASTRODUODENOSCOPY (EGD);  Surgeon: Sable Feil, MD;  Location: [facility];  Service: Endoscopy;  Laterality: N/A;  . EYE SURGERY    . LAPAROTOMY    . RETINOPATHY SURGERY Bilateral   . rotator cuff surgery     left   HPI:  85 y.o. male who presented to [facility] with AMS from his [place]. LKN was 1224 on Sunday. On arrival he was alert and fully oriented. His daughter-in-law stated her husband called her and told her that his father "was not himself", not oriented, but did not lose consciousness. He may have had some "facial droop, slurred speech, and left sided weakness" per report. The patient himself states that he fainted during the church service and when he woke up people were standing around him. CT of head with no evidence of acute intracranial abnormality   Assessment / Plan / Recommendation Clinical Impression  Pt presents at baseline for cognitive linguistic functioning.  Repective and expressive language skills as well as motor speech skills appear intact.  Pt notes previoulsy reported slurred speech has since resolved. Pt denies any changes with cognition.  No acute intracranial abormalities noted on CT of head. Pt will have 24 hour support from spouse and family at DC.  No acute ST needs identified.     SLP Assessment  SLP Recommendation/Assessment: Patient does not need any further Speech Lanaguage Pathology Services    Follow Up  Recommendations  24 hour supervision/assistance    Frequency and Duration           SLP Evaluation Cognition  Overall Cognitive Status: Within Functional Limits for tasks assessed Arousal/Alertness: Awake/alert Orientation Level: Oriented X4       Comprehension  Auditory Comprehension Overall Auditory Comprehension: Appears within functional limits for tasks assessed Visual Recognition/Discrimination Discrimination: Within Function Limits    Expression Expression Primary Mode of Expression: Verbal Verbal Expression Overall Verbal Expression: Appears within functional limits for tasks assessed Written Expression Dominant Hand: Right Written Expression: Within Functional Limits   Oral / Motor  Oral Motor/Sensory Function Overall Oral Motor/Sensory Function: Within functional limits Motor Speech Overall Motor Speech: Appears within functional limits for tasks assessed   GO                    Dain Laseter E Donavyn Fecher MA, CCC-SLP Acute Rehab Speech Language Pathologist  12/26/2018, 10:32 AM

## 2018-12-26 NOTE — Progress Notes (Signed)
Carotid duplex completed. Preliminary results in Chart review CV proc. [place] Manuel Kline,RVS 12/26/2018, 2:27 PM

## 2018-12-26 NOTE — Progress Notes (Signed)
[facility] PROGRESS NOTE  Manuel Kline RUE:[id number] DOB: 10-06-1931 DOA: 12/25/2018 PCP: Dettinger, Fransisca Kaufmann, MD  Assessment/Plan:  Syncope, AMS, Left sided weakness/ slurred speech. Symptoms resolved this am. No events on tele. Echo and carotid dopplar results pending. CT head no acute intracranial abnormality. No MRI due to ICD. Provide with asa and statin. Evaluated by neuro who recommend repeat head CT in 3 days to assess for possible small stroke and CTA head and neck. B12, TSH and RPR within limits of normal. Sed rate 27. HgA1c 7.3. PT recommends HH PT. Speech no further therapy. - follow carotid ultrasound -follow cardiac echo -Cont Aspirin (increase to 325mg  po qday) -appreciate cards and neuro input  Elevated troponin ddx secondary to Syncope/ CVA ddx ACS, renal insufficiency. No chest pain. No EKG changes. Troponin elevated but flat.  Evaluated by cardiology who opine could be related to neuro event. Recommends no further invasive management and continue with aggressive secondary risk factor prevention.  -continue asa -follow echo -continue statin  H/o CHF (EF 45%)  -Cont Toprol XL, Hydralazine,  Lasix as above -follow echo results  Dm2 hgA1c 7.3. fsbs ac and qhs, ISS Decrease NPH to 20 units [place] bid Decrease Humulin R to 20units [place] bid  Diabetic neuropathy Cont Gabapentin 300mg  po bid  CKD stage 4 creatinine trending down this am.  -continue to hold nephrotoxins. -Check cmp in am  ? H/o Gout Cont Allopurinol 300mg  1/2 po qday   Code Status: full Family Communication:  Disposition Plan: home in am hopefully   Consultants:  [facility] cardiology  [facility] neurology  Procedures:  Echo  Carotid dopplar  Antibiotics:    HPI/Subjective: Sitting up in bed eating breakfast. Denies pain/discomfort. No acute distress  Objective: Vitals:   12/26/18 1203 12/26/18 1243  BP: (!) 172/95 (!) 149/74  Pulse: 61   Resp: 18   Temp: (!) 97.5 F (36.4  C)   SpO2: 99%    No intake or output data in the 24 hours ending 12/26/18 1304 Filed Weights   12/25/18 1415  Weight: 106.1 kg    Exam:   General:  Awake alert no acute distress  Cardiovascular: RRR no mgr no LE edema  Respiratory: normal effort BS clear bilaterally no wheeze  Abdomen: obese soft +BS no guarding or rebounding  Musculoskeletal: joints without swelling/erythema   Data Reviewed: Basic Metabolic Panel: Recent Labs  Lab 12/25/18 1441 12/26/18 0558  NA 138 139  K 4.0 4.3  CL 103 105  CO2 25 23  GLUCOSE 148* 153*  BUN 26* 23  CREATININE 2.04* 1.90*  CALCIUM 9.2 8.5*   Liver Function Tests: Recent Labs  Lab 12/25/18 1441 12/26/18 0558  AST 36 27  ALT 27 22  ALKPHOS 92 76  BILITOT 0.8 0.8  PROT 7.3 6.2*  ALBUMIN 3.8 3.5   No results for input(s): LIPASE, AMYLASE in the last 168 hours. No results for input(s): AMMONIA in the last 168 hours. CBC: Recent Labs  Lab 12/25/18 1441 12/26/18 0558  WBC 8.6 7.2  HGB 13.6 13.0  HCT 40.4 38.3*  MCV 91.2 91.4  PLT 189 176   Cardiac Enzymes: Recent Labs  Lab 12/25/18 1441 12/25/18 1804 12/25/18 2240 12/26/18 0558 12/26/18 1050  CKTOTAL  --   --  304  --   --   CKMB  --   --  6.4*  --   --   TROPONINI 3.05* 3.18* 3.78* 3.24* 3.18*   BNP (last 3 results) No results  for input(s): BNP in the last 8760 hours.  ProBNP (last 3 results) No results for input(s): PROBNP in the last 8760 hours.  CBG: Recent Labs  Lab 12/25/18 2052 12/26/18 0617 12/26/18 1123  GLUCAP 95 135* 232*    No results found for this or any previous visit (from the past 240 hour(s)).   Studies: Ct Head Wo Contrast  Result Date: 12/25/2018 CLINICAL DATA:  Slurred speech, facial droop EXAM: CT HEAD WITHOUT CONTRAST TECHNIQUE: Contiguous axial images were obtained from the base of the skull through the vertex without intravenous contrast. COMPARISON:  Report 01/13/2002 FINDINGS: Brain: No acute territorial infarction,  hemorrhage or intracranial mass. Moderate atrophy. Mild small vessel ischemic changes of the white matter. Chronic appearing lacunar infarct within the left basal ganglia and white matter. Mildly prominent ventricles, felt secondary to atrophy. Vascular: No hyperdense vessels.  Carotid vascular calcification Skull: Normal. Negative for fracture or focal lesion. Sinuses/Orbits: Mild mucosal thickening in the ethmoid sinuses. Other: None IMPRESSION: 1. No CT evidence for acute intracranial abnormality. 2. Atrophy and mild small vessel ischemic changes of the white matter Electronically Signed   By: Donavan Foil M.D.   On: 12/25/2018 15:29   Dg Chest Port 1 View  Result Date: 12/25/2018 CLINICAL DATA:  Chest pain.  History of sleep apnea and CABG. EXAM: PORTABLE CHEST 1 VIEW COMPARISON:  Radiographs 06/09/2016 and 08/29/2015. FINDINGS: 1434 hours. Left subclavian AICD leads appear unchanged. The heart size and mediastinal contours are stable post median sternotomy and CABG. There are lower lung volumes with mildly increased atelectasis at both lung bases. No edema, confluent airspace opacity, pleural effusion or pneumothorax. The bones appear unchanged. Telemetry leads overlie the chest. IMPRESSION: Slightly lower lung volumes with resulting mild bibasilar atelectasis. Otherwise stable postoperative chest without acute findings. Electronically Signed   By: Richardean Sale M.D.   On: 12/25/2018 15:13   Ct Angio Chest/abd/pel For Dissection W And/or Wo Contrast  Result Date: 12/25/2018 CLINICAL DATA:  Transient altered mental status and dysphasia earlier today. EXAM: CT ANGIOGRAPHY CHEST, ABDOMEN AND PELVIS TECHNIQUE: Multidetector CT imaging through the chest, abdomen and pelvis was performed using the standard protocol during bolus administration of intravenous contrast. Multiplanar reconstructed images and MIPs were obtained and reviewed to evaluate the vascular anatomy. CONTRAST:  70mL OMNIPAQUE IOHEXOL 300  MG/ML  SOLN COMPARISON:  Chest radiographs today and 06/09/2016. FINDINGS: CTA CHEST FINDINGS Cardiovascular: Pre contrast images demonstrate atherosclerosis of the aorta, great vessels and coronary arteries status post median sternotomy and CABG. There are no displaced intimal calcifications. Following contrast, the aortic lumen opacifies normally. There is no evidence of aneurysm or dissection. There is suboptimal opacification of the pulmonary arteries, but no evidence of pulmonary embolism. Patient has a pacemaker. The heart is mildly enlarged. No significant pericardial effusion. Mediastinum/Nodes: There are mildly enlarged mediastinal lymph nodes, including an 11 mm right paratracheal node on image 27/8 and subcarinal nodes measuring 13 mm on image 63/8 and 13 mm on image 69/8. No hilar or axillary lymphadenopathy. The thyroid gland, trachea and esophagus demonstrate no significant findings. Lungs/Pleura: There is no pleural effusion or pneumothorax. Mild subpleural reticulation is present within both lungs which appears chronic. There is mild atelectasis at the right lung base. No confluent airspace opacity or suspicious pulmonary nodule. Musculoskeletal/Chest wall: There is no chest wall mass or suspicious osseous finding. There is nonunion of the median sternotomy, although the sternotomy wires are intact. Review of the MIP images confirms the above findings. CTA ABDOMEN  AND PELVIS FINDINGS VASCULAR Aorta: Mild atherosclerosis without evidence of aneurysm, dissection or stenosis. Celiac: Mild ostial stenosis. Otherwise widely patent with conventional anatomy. SMA: Proximal atherosclerosis without stenosis. Renals: Atherosclerosis without stenosis. IMA: Patent. Inflow: Mild atherosclerosis without stenosis. Veins: Limited opacification.  No suggested acute findings. Review of the MIP images confirms the above findings. NON-VASCULAR Hepatobiliary: No focal hepatic abnormalities or abnormal enhancement. There  are small gallstones. No evidence of gallbladder wall thickening or biliary dilatation. Pancreas: Unremarkable. No evidence of pancreatic mass, ductal dilatation or surrounding inflammation. Spleen: Normal in size without focal abnormality. Adrenals/Urinary Tract: Both adrenal glands appear normal. Both kidneys demonstrate mild cortical thinning, but no abnormal enhancement or calculi. There is no hydronephrosis. The bladder appears unremarkable. Stomach/Bowel: No evidence of bowel wall thickening, distention or surrounding inflammation. The appendix appears normal. Mild sigmoid diverticulosis. Lymphatic: There are no enlarged abdominopelvic lymph nodes. Reproductive: The prostate gland and seminal vesicles appear normal. Other: Small left inguinal hernia containing only fat. No ascites. Postsurgical changes in the anterior abdominal wall. Musculoskeletal: No acute or suspicious osseous findings. There is multilevel thoracolumbar scoliosis. There is a sclerotic lesion laterally in the left 8th rib which is likely an incidental bone island. Review of the MIP images confirms the above findings. IMPRESSION: 1. No acute vascular findings. There is aortic and branch vessel atherosclerosis with mild ostial stenosis of the celiac trunk. No large vessel occlusion, aneurysm or dissection. 2. No evidence of acute pulmonary embolism. 3. Small mediastinal lymph nodes, likely reactive. 4. Cholelithiasis without evidence of cholecystitis. 5. Postsurgical changes as described. Electronically Signed   By: Richardean Sale M.D.   On: 12/25/2018 17:43    Scheduled Meds: . allopurinol  150 mg Oral Daily  . aspirin  300 mg Rectal Daily   Or  . aspirin  325 mg Oral Daily  . atorvastatin  40 mg Oral Daily  . furosemide  40 mg Oral BID  . gabapentin  300 mg Oral BID  . hydrALAZINE  10 mg Oral Q8H  . insulin aspart  0-5 Units Subcutaneous QHS  . insulin aspart  0-9 Units Subcutaneous TID WC  . insulin aspart  15 Units  Subcutaneous BID WC  . insulin NPH Human  20 Units Subcutaneous BID AC  . metoprolol succinate  50 mg Oral Daily   Continuous Infusions:  Principal Problem:   Syncope Active Problems:   CKD (chronic kidney disease) stage 4, GFR 15-29 ml/min (HCC)   Left-sided weakness   Stroke-like symptoms   Cerebral embolism with cerebral infarction   Type 2 diabetes, uncontrolled, with neuropathy (HCC)   CAD (coronary artery disease)   Elevated troponin    Time spent: 45 minutes    [place] NP  [facility]  If 7PM-7AM, please contact night-coverage at [email or web address], password [facility] 12/26/2018, 1:04 PM  LOS: 0 days

## 2018-12-27 ENCOUNTER — Other Ambulatory Visit: Payer: Self-pay

## 2018-12-27 ENCOUNTER — Inpatient Hospital Stay (HOSPITAL_COMMUNITY): Payer: Medicare Other

## 2018-12-27 DIAGNOSIS — N184 Chronic kidney disease, stage 4 (severe): Secondary | ICD-10-CM

## 2018-12-27 LAB — GLUCOSE, CAPILLARY
Glucose-Capillary: 132 mg/dL — ABNORMAL HIGH (ref 70–99)
Glucose-Capillary: 181 mg/dL — ABNORMAL HIGH (ref 70–99)
Glucose-Capillary: 163 mg/dL — ABNORMAL HIGH (ref 70–99)

## 2018-12-27 LAB — BASIC METABOLIC PANEL
Anion gap: 10 (ref 5–15)
BUN: 29 mg/dL — ABNORMAL HIGH (ref 8–23)
CO2: 23 mmol/L (ref 22–32)
Calcium: 9.1 mg/dL (ref 8.9–10.3)
Chloride: 104 mmol/L (ref 98–111)
Creatinine, Ser: 2.18 mg/dL — ABNORMAL HIGH (ref 0.61–1.24)
GFR calc Af Amer: 31 mL/min — ABNORMAL LOW (ref >60)
GFR calc non Af Amer: 27 mL/min — ABNORMAL LOW (ref >60)
Glucose, Bld: 119 mg/dL — ABNORMAL HIGH (ref 70–99)
Potassium: 4.2 mmol/L (ref 3.5–5.1)
Sodium: 137 mmol/L (ref 135–145)

## 2018-12-27 MED ORDER — ASPIRIN 81 MG PO TBEC: 81.0000 mg | DELAYED_RELEASE_TABLET | Freq: Every day | ORAL | Status: AC

## 2018-12-27 MED ORDER — ASPIRIN EC 81 MG PO TBEC: 81.0000 mg | DELAYED_RELEASE_TABLET | Freq: Every day | ORAL | Status: DC

## 2018-12-27 MED ORDER — SODIUM CHLORIDE 0.9 % IV SOLN
INTRAVENOUS | Status: DC
  Administered 2018-12-27: 11:00:00 via INTRAVENOUS

## 2018-12-27 MED ORDER — ASPIRIN 81 MG PO TBEC: 81.0000 mg | DELAYED_RELEASE_TABLET | Freq: Every day | ORAL | Status: DC

## 2018-12-27 MED ORDER — CLOPIDOGREL BISULFATE 75 MG PO TABS: 75.0000 mg | ORAL_TABLET | Freq: Every day | ORAL | 1 refills | Status: DC

## 2018-12-27 NOTE — Progress Notes (Signed)
Progress Note  Patient Name: Manuel Kline Date of Encounter: 12/27/2018  Primary Cardiologist: Krasowski/Taylor, MD  Subjective   No chest pain no shortness of breath.  Feels fairly well.  Told me several stories.  Inpatient Medications    Scheduled Meds:  allopurinol  150 mg Oral Daily   aspirin EC  81 mg Oral Daily   atorvastatin  40 mg Oral Daily   clopidogrel  75 mg Oral Daily   furosemide  40 mg Oral BID   gabapentin  300 mg Oral BID   hydrALAZINE  10 mg Oral Q8H   insulin aspart  0-5 Units Subcutaneous QHS   insulin aspart  0-9 Units Subcutaneous TID WC   insulin aspart  15 Units Subcutaneous BID WC   insulin NPH Human  20 Units Subcutaneous BID AC   metoprolol succinate  50 mg Oral Daily   Continuous Infusions:  PRN Meds: acetaminophen **OR** acetaminophen (TYLENOL) oral liquid 160 mg/5 mL **OR** acetaminophen, polyethylene glycol   Vital Signs    Vitals:   12/26/18 2043 12/26/18 2324 12/27/18 0437 12/27/18 0823  BP: (!) 142/83 134/75 138/67 132/66  Pulse: 60 62 60 60  Resp: 18 18 18 17   Temp: 97.9 F (36.6 C) 98.1 F (36.7 C) 97.7 F (36.5 C) 97.7 F (36.5 C)  TempSrc: Oral Oral Oral Oral  SpO2: 100% 95% 100% 99%  Weight:      Height:        Intake/Output Summary (Last 24 hours) at 12/27/2018 0852 Last data filed at 12/27/2018 4081 Gross per 24 hour  Intake 120 ml  Output --  Net 120 ml   Filed Weights   12/25/18 1415  Weight: 106.1 kg    Physical Exam   GEN: Well nourished, well developed, in no acute distress.  HEENT: Grossly normal.  Neck: Supple, no JVD, carotid bruits, or masses. Cardiac: RRR, no murmurs, rubs, or gallops. No clubbing, cyanosis, edema.  Radials/DP/PT 2+ and equal bilaterally.  Respiratory:  Respirations regular and unlabored, clear to auscultation bilaterally. GI: Soft, nontender, nondistended, BS + x 4. MS: no deformity or atrophy. Skin: warm and dry, no rash. Neuro:  Strength and sensation are  intact. Psych: AAOx3.  Normal affect.  Labs    Chemistry Recent Labs  Lab 12/25/18 1441 12/26/18 0558 12/27/18 0402  NA 138 139 137  K 4.0 4.3 4.2  CL 103 105 104  CO2 25 23 23   GLUCOSE 148* 153* 119*  BUN 26* 23 29*  CREATININE 2.04* 1.90* 2.18*  CALCIUM 9.2 8.5* 9.1  PROT 7.3 6.2*  --   ALBUMIN 3.8 3.5  --   AST 36 27  --   ALT 27 22  --   ALKPHOS 92 76  --   BILITOT 0.8 0.8  --   GFRNONAA 29* 32* 27*  GFRAA 34* 37* 31*  ANIONGAP 10 11 10      Hematology Recent Labs  Lab 12/25/18 1441 12/26/18 0558  WBC 8.6 7.2  RBC 4.43 4.19*  HGB 13.6 13.0  HCT 40.4 38.3*  MCV 91.2 91.4  MCH 30.7 31.0  MCHC 33.7 33.9  RDW 14.1 14.1  PLT 189 176    Cardiac Enzymes Recent Labs  Lab 12/25/18 1804 12/25/18 2240 12/26/18 0558 12/26/18 1050  TROPONINI 3.18* 3.78* 3.24* 3.18*   No results for input(s): TROPIPOC in the last 168 hours.   BNPNo results for input(s): BNP, PROBNP in the last 168 hours.   DDimer No results  for input(s): DDIMER in the last 168 hours.   Radiology    Ct Head Wo Contrast  Result Date: 12/25/2018 CLINICAL DATA:  Slurred speech, facial droop EXAM: CT HEAD WITHOUT CONTRAST TECHNIQUE: Contiguous axial images were obtained from the base of the skull through the vertex without intravenous contrast. COMPARISON:  Report 01/13/2002 FINDINGS: Brain: No acute territorial infarction, hemorrhage or intracranial mass. Moderate atrophy. Mild small vessel ischemic changes of the white matter. Chronic appearing lacunar infarct within the left basal ganglia and white matter. Mildly prominent ventricles, felt secondary to atrophy. Vascular: No hyperdense vessels.  Carotid vascular calcification Skull: Normal. Negative for fracture or focal lesion. Sinuses/Orbits: Mild mucosal thickening in the ethmoid sinuses. Other: None IMPRESSION: 1. No CT evidence for acute intracranial abnormality. 2. Atrophy and mild small vessel ischemic changes of the white matter  Electronically Signed   By: Donavan Foil M.D.   On: 12/25/2018 15:29   Dg Chest Port 1 View  Result Date: 12/25/2018 CLINICAL DATA:  Chest pain.  History of sleep apnea and CABG. EXAM: PORTABLE CHEST 1 VIEW COMPARISON:  Radiographs 06/09/2016 and 08/29/2015. FINDINGS: 1434 hours. Left subclavian AICD leads appear unchanged. The heart size and mediastinal contours are stable post median sternotomy and CABG. There are lower lung volumes with mildly increased atelectasis at both lung bases. No edema, confluent airspace opacity, pleural effusion or pneumothorax. The bones appear unchanged. Telemetry leads overlie the chest. IMPRESSION: Slightly lower lung volumes with resulting mild bibasilar atelectasis. Otherwise stable postoperative chest without acute findings. Electronically Signed   By: Richardean Sale M.D.   On: 12/25/2018 15:13   Ct Angio Chest/abd/pel For Dissection W And/or Wo Contrast  Result Date: 12/25/2018 CLINICAL DATA:  Transient altered mental status and dysphasia earlier today. EXAM: CT ANGIOGRAPHY CHEST, ABDOMEN AND PELVIS TECHNIQUE: Multidetector CT imaging through the chest, abdomen and pelvis was performed using the standard protocol during bolus administration of intravenous contrast. Multiplanar reconstructed images and MIPs were obtained and reviewed to evaluate the vascular anatomy. CONTRAST:  58mL OMNIPAQUE IOHEXOL 300 MG/ML  SOLN COMPARISON:  Chest radiographs today and 06/09/2016. FINDINGS: CTA CHEST FINDINGS Cardiovascular: Pre contrast images demonstrate atherosclerosis of the aorta, great vessels and coronary arteries status post median sternotomy and CABG. There are no displaced intimal calcifications. Following contrast, the aortic lumen opacifies normally. There is no evidence of aneurysm or dissection. There is suboptimal opacification of the pulmonary arteries, but no evidence of pulmonary embolism. Patient has a pacemaker. The heart is mildly enlarged. No significant  pericardial effusion. Mediastinum/Nodes: There are mildly enlarged mediastinal lymph nodes, including an 11 mm right paratracheal node on image 27/8 and subcarinal nodes measuring 13 mm on image 63/8 and 13 mm on image 69/8. No hilar or axillary lymphadenopathy. The thyroid gland, trachea and esophagus demonstrate no significant findings. Lungs/Pleura: There is no pleural effusion or pneumothorax. Mild subpleural reticulation is present within both lungs which appears chronic. There is mild atelectasis at the right lung base. No confluent airspace opacity or suspicious pulmonary nodule. Musculoskeletal/Chest wall: There is no chest wall mass or suspicious osseous finding. There is nonunion of the median sternotomy, although the sternotomy wires are intact. Review of the MIP images confirms the above findings. CTA ABDOMEN AND PELVIS FINDINGS VASCULAR Aorta: Mild atherosclerosis without evidence of aneurysm, dissection or stenosis. Celiac: Mild ostial stenosis. Otherwise widely patent with conventional anatomy. SMA: Proximal atherosclerosis without stenosis. Renals: Atherosclerosis without stenosis. IMA: Patent. Inflow: Mild atherosclerosis without stenosis. Veins: Limited opacification.  No suggested  acute findings. Review of the MIP images confirms the above findings. NON-VASCULAR Hepatobiliary: No focal hepatic abnormalities or abnormal enhancement. There are small gallstones. No evidence of gallbladder wall thickening or biliary dilatation. Pancreas: Unremarkable. No evidence of pancreatic mass, ductal dilatation or surrounding inflammation. Spleen: Normal in size without focal abnormality. Adrenals/Urinary Tract: Both adrenal glands appear normal. Both kidneys demonstrate mild cortical thinning, but no abnormal enhancement or calculi. There is no hydronephrosis. The bladder appears unremarkable. Stomach/Bowel: No evidence of bowel wall thickening, distention or surrounding inflammation. The appendix appears  normal. Mild sigmoid diverticulosis. Lymphatic: There are no enlarged abdominopelvic lymph nodes. Reproductive: The prostate gland and seminal vesicles appear normal. Other: Small left inguinal hernia containing only fat. No ascites. Postsurgical changes in the anterior abdominal wall. Musculoskeletal: No acute or suspicious osseous findings. There is multilevel thoracolumbar scoliosis. There is a sclerotic lesion laterally in the left 8th rib which is likely an incidental bone island. Review of the MIP images confirms the above findings. IMPRESSION: 1. No acute vascular findings. There is aortic and branch vessel atherosclerosis with mild ostial stenosis of the celiac trunk. No large vessel occlusion, aneurysm or dissection. 2. No evidence of acute pulmonary embolism. 3. Small mediastinal lymph nodes, likely reactive. 4. Cholelithiasis without evidence of cholecystitis. 5. Postsurgical changes as described. Electronically Signed   By: Richardean Sale M.D.   On: 12/25/2018 17:43   Vas US Carotid (at [place] Only)  Result Date: 12/26/2018 Carotid Arterial Duplex Study Indications: TIA, Speech disturbance and Weakness. Performing Technologist: Toma Copier RVS  Examination Guidelines: A complete evaluation includes B-mode imaging, spectral Doppler, color Doppler, and power Doppler as needed of all accessible portions of each vessel. Bilateral testing is considered an integral part of a complete examination. Limited examinations for reoccurring indications may be performed as noted.  Right Carotid Findings: +----------+--------+--------+--------+------------+---------------------------+             PSV cm/s EDV cm/s Stenosis Describe     Comments                     +----------+--------+--------+--------+------------+---------------------------+  CCA Prox   79       8                                                           +----------+--------+--------+--------+------------+---------------------------+   CCA Distal 89       13                                                          +----------+--------+--------+--------+------------+---------------------------+  ICA Prox   166      15       1-39%    heterogenous mild plaque with acoustic                                                        shadowing                    +----------+--------+--------+--------+------------+---------------------------+  ICA Mid    113      15                                                          +----------+--------+--------+--------+------------+---------------------------+  ICA Distal 90       18                                                          +----------+--------+--------+--------+------------+---------------------------+  ECA        255      9                 heterogenous mild plaque                  +----------+--------+--------+--------+------------+---------------------------+ +----------+--------+-------+--------+-------------------+             PSV cm/s EDV cms Describe Arm Pressure (mmHG)  +----------+--------+-------+--------+-------------------+  Subclavian 56                                             +----------+--------+-------+--------+-------------------+ +---------+--------+--+--------+-+  Vertebral PSV cm/s 27 EDV cm/s 5  +---------+--------+--+--------+-+ Acoustic shadowing in the ICA may obscure higher velocities  Left Carotid Findings: +----------+-------+--------+--------+------------+----------------------------+             PSV     EDV cm/s Stenosis Describe     Comments                                  cm/s                                                                 +----------+-------+--------+--------+------------+----------------------------+  CCA Prox   67      12                                                           +----------+-------+--------+--------+------------+----------------------------+  CCA Distal 102     15                heterogenous mild plaque                    +----------+-------+--------+--------+------------+----------------------------+  ICA Prox   161     28       1-39%    heterogenous plaque with acoustic  shadowing                     +----------+-------+--------+--------+------------+----------------------------+  ICA Mid    132     19                                                           +----------+-------+--------+--------+------------+----------------------------+  ICA Distal 107     27                                                           +----------+-------+--------+--------+------------+----------------------------+  ECA        370     5                 heterogenous moderate plaque               +----------+-------+--------+--------+------------+----------------------------+ +----------+--------+--------+--------+-------------------+  Subclavian PSV cm/s EDV cm/s Describe Arm Pressure (mmHG)  +----------+--------+--------+--------+-------------------+             167                                             +----------+--------+--------+--------+-------------------+ +---------+--------+--+--------+-+  Vertebral PSV cm/s 38 EDV cm/s 8  +---------+--------+--+--------+-+ Acousticshadowing may obscue higher velocities  Summary: Right Carotid: Velocities in the right ICA are consistent with a 1-39% stenosis.                See technical comments listed above. Left Carotid: Velocities in the left ICA are consistent with a 1-39% stenosis.               See technical comments listed above. Vertebrals:  Bilateral vertebral arteries demonstrate antegrade flow. Subclavians: Normal flow hemodynamics were seen in bilateral subclavian              arteries. *See table(s) above for measurements and observations.     Preliminary     Telemetry    Atrial pacing- Personally Reviewed  ECG    No new tracing as of 12/27/2018- Personally Reviewed  Cardiac Studies   Echocardiogram 10/13/2018: 1. The  left ventricle has mildly reduced systolic function, with an ejection fraction of 45-50%. Left ventricular diastolic Doppler parameters are consistent with impaired relaxation. 2. The mitral valve is normal in structure. No evidence of mitral valve stenosis. 3. The tricuspid valve is normal in structure. 4. The aortic valve is tricuspid There is mild thickening and mild calcification of the aortic valve. 5. The aortic root is normal in size and structure. 6. There is mild dilatation of the ascending aorta measuring 36 mm.  Preliminary on today's echocardiogram appears very similar to as above with 45 to 50% EF, Definity contrast.  Patient Profile     85 y.o. male with a hx of chronic systolic heart failurewho is being seen for the evaluation of elevated troponinat the request of Dr. Sedonia Small.Altered mental status and possible stroke was the reason he came in today.  Assessment & Plan    1. Elevated troponin in the  setting of possibe transient ischemic attack: Coronary artery disease post CABG -Patient presented with altered mental status and possible stroke, appreciate neurology without residual neurologic findings at this time.  -CT with no evidence for acute intracranial abnormality>> plans to repeat head CT to assess for possible small stroke not visible on initial CT per neurology -Found to have an elevated troponin at 3.05, 3.18, 3.78, 3.24, 3.18 with no EKG changes and no complaints of anginal symptoms.  Troponin elevation could be secondary stroke, however etiology is unclear in the setting of no ACS symptoms -No plans to proceed with any invasive management at this time unless symptoms arise.   -Continue with aggressive secondary risk factor prevention. -Plavix 75 mg mg daily with aspirin 81 per neurology  -Continue high intensity statin, atorvastatin 40 mg daily -No PE on CT  2.  Chronic kidney disease stage III: -Creatinine, 2.18 today, increased from today which appears to  be improved from 1.90 yesterday -Baseline appears to be in the 2.1- 2.5 -Continue to avoid nephrotoxic medications and monitor closely  3.  Hyperlipidemia: -Last LDL, 35 on 12/26/2018 -Continue high intensity atorvastatin 40 mg daily  4.  History of CAD status post CABG: -Continue with secondary risk factor prevention -No anginal symptoms on presentation  5.  Status post ICD placement: -Follows with Dr. Lovena Le -Last remote device check 10/24/2018 with appropriate functioning, leads a battery stable per chart review.   Signed, Kathyrn Drown NP-C [facility] Pager: [phone] 12/27/2018, 8:52 AM     For questions or updates, please contact   Please consult [email or web address] for contact info under Cardiology/STEMI.  Personally seen and examined. Agree with above.   Feeling well, no complaints, no chest pain, no shortness of breath.  Told me several stories today.  Physical exam personally done as above.  Elevated troponin-3 - Unclear etiology, perhaps related to neurologic episode.  No anginal symptoms, no chest pain, no shortness of breath.  Echocardiogram unchanged.  Continue with aggressive secondary risk factor prevention.  As with other etiologies of myocardial injury, and elevated troponin does portend a worsened overall prognosis.  ICD -Functioning well.  Atrial pacing noted.  When comfortable from neurologic standpoint, I am comfortable with discharge home.  Candee Furbish, MD

## 2018-12-27 NOTE — TOC Initial Note (Signed)
Transition of Care [facility]) - Initial/Assessment Note    Patient Details  Name: Manuel Kline MRN: [id number] Date of Birth: 02/19/1932  [facility] CM/SW Contact:    Geralynn Ochs, LCSW Phone Number: 12/27/2018, 3:11 PM  Clinical Narrative:  Patient from home with spouse, will be returning home today. Agreeable to home health PT. Spoke with patient's daughter-in-law Edmonia Lynch, they prefer [facility].                  Expected Discharge Plan: [place] Barriers to Discharge: No Barriers Identified   Patient Goals and CMS Choice Patient states their goals for this hospitalization and ongoing recovery are:: get back home CMS Medicare.gov Compare Post Acute Care list provided to:: Patient Choice offered to / list presented to : Patient, Adult Children  Expected Discharge Plan and Services Expected Discharge Plan: [place] Choice: [place] arrangements for the past 2 months: IXL: PT HH Agency: [place] ([place]) Date HH Agency Contacted: 12/27/18 Time HH Agency Contacted: 31 Representative spoke with at [place]: Butch Penny  Prior Living Arrangements/Services Living arrangements for the past 2 months: Single Family Home Lives with:: Self, Spouse   Do you feel safe going back to the place where you live?: Yes               Activities of Daily Living Home Assistive Devices/Equipment: None ADL Screening (condition at time of admission) Patient's cognitive ability adequate to safely complete daily activities?: Yes Is the patient deaf or have difficulty hearing?: Yes Does the patient have difficulty seeing, even when wearing glasses/contacts?: No Does the patient have difficulty concentrating, remembering, or making decisions?: No Patient able to express need for assistance with ADLs?: Yes Does the patient have difficulty  dressing or bathing?: No Independently performs ADLs?: Yes (appropriate for developmental age) Does the patient have difficulty walking or climbing stairs?: No Weakness of Legs: Both Weakness of Arms/Hands: None  Permission Sought/Granted                  Emotional Assessment Appearance:: Appears stated age Attitude/Demeanor/Rapport: Engaged Affect (typically observed): Pleasant Orientation: : Oriented to Self, Oriented to Place, Oriented to  Time, Oriented to Situation Alcohol / Substance Use: Not Applicable Psych Involvement: No (comment)  Admission diagnosis:  Elevated troponin [R79.89] Neurological deficit, transient [R29.818] Chest pain [R07.9] Patient Active Problem List   Diagnosis Date Noted  . Stroke-like symptoms 12/26/2018  . Cerebral embolism with cerebral infarction 12/26/2018  . Left-sided weakness 12/25/2018  . Elevated troponin 12/25/2018  . CKD (chronic kidney disease) stage 4, GFR 15-29 ml/min (HCC) 11/08/2017  . Acute on chronic systolic heart failure, NYHA class 2 ([place]) 11/08/2017  . Presence of automatic (implantable) cardiac defibrillator 11/08/2017  . Ventricular tachycardia (paroxysmal) ([place]) 07/14/2016  . Essential hypertension 11/30/2015  . Diabetic peripheral neuropathy ([place]) 01/31/2015  . OSA on CPAP 11/05/2014  . Severe obesity (BMI >= 40) ([place]) 11/05/2014  . CAD (coronary artery disease)   . Dual implantable cardioverter-defibrillator in situ   . Syncope 08/06/2009  . Hyperlipidemia   . Type 2 diabetes, uncontrolled, with neuropathy ([name])   . Morbid obesity ([place])   . Ischemic cardiomyopathy   . Chronic systolic heart failure ([place])   .  S/P CABG (coronary artery bypass graft) 11/02/2000   PCP:  Dettinger, Fransisca Kaufmann, MD Pharmacy:   [facility] [address] [place] [address] Phone: [phone] Fax: [phone]     Social Determinants of Health (SDOH) Interventions     Readmission Risk Interventions No flowsheet data found.

## 2018-12-27 NOTE — Progress Notes (Signed)
[facility] PROGRESS NOTE  Manuel Kline NWG:[id number] DOB: 07/28/1932 DOA: 12/25/2018 PCP: Dettinger, Fransisca Kaufmann, MD  Assessment/Plan:  Syncope, AMS, Left sided weakness/ slurred speech. Symptoms resolved.. No events on tele. Echo with EF 55%, mildly increased left ventricular wall thickness, no evidence of wall motion abnormalities. Carotid dopplar with bilateral ICA consistent with 1-39% stenosis.  CT head no acute intracranial abnormality. No MRI due to ICD. Provide with asa and statin. Evaluated by neuro who recommend CTA head and neck. As well as 81mg  asa + Plavix for 3 weeks then just Plavix. B12, TSH and RPR within limits of normal. Sed rate 27. HgA1c 7.3. PT recommends HH PT. Speech no further therapy.  -asa 81mg  for 3 weeks -add Plavix -obtain CTA head and neck tomorrow (4/28) -appreciate cards and neuro input  Elevated troponin ddx secondary to Syncope/ CVA ddx ACS, renal insufficiency. No chest pain. No EKG changes. Troponin elevated but flat.  Evaluated by cardiology who opine could be related to neuro event. Recommends no further invasive management and continue with aggressive secondary risk factor prevention.  -continue asa -continue statin  H/o CHF (EF 45%)  -Cont Toprol XL,Hydralazine -holding lasix for now due to worsening CKD and need for CTA -daily weight -intake and output  Dm2 hgA1c 7.3. fsbs ac and qhs, ISS Decrease NPH to 20 units [place] bid Decrease Humulin R to 15units [place] bid  Diabetic neuropathy Cont Gabapentin 300mg  po bid  CKD stage 4 creatinine trending up this am. Patient needs CTA per neurology.  -provide gentle IV fluids -continue to hold nephrotoxins. (lasix, ace inhibitor) -Check cmp in am  ? H/o Gout Cont Allopurinol 300mg  1/2 po qday   Code Status: full Family Communication: updated spouse on phone Disposition Plan: home likely tomorrow after CTA head and neck   Consultants:  [place] neurology  [facility]  cardiology  Procedures:  echo  Antibiotics:  none  HPI/Subjective: Sitting in chair watching TV. No acute distress. Denies pain/discomfort  Admitted 4/26 with stroke like symptoms/syncope/elevated troponin. Likely tia per neurology. CTA tomorrow and then likely discharge [facility] with [facility].   Objective: Vitals:   12/27/18 0437 12/27/18 0823  BP: 138/67 132/66  Pulse: 60 60  Resp: 18 17  Temp: 97.7 F (36.5 C) 97.7 F (36.5 C)  SpO2: 100% 99%    Intake/Output Summary (Last 24 hours) at 12/27/2018 1146 Last data filed at 12/27/2018 5784 Gross per 24 hour  Intake 120 ml  Output -  Net 120 ml   Filed Weights   12/25/18 1415  Weight: 106.1 kg    Exam:   General:  Up in chair awake, obese very HOH no acute distress  Cardiovascular: RRR no mgr no LE edema  Respiratory: normal effort BS clear bilaterally no wheeze  Abdomen: obese soft +BS no guarding or rebounding  Musculoskeletal: joints without swelling/erythema   Data Reviewed: Basic Metabolic Panel: Recent Labs  Lab 12/25/18 1441 12/26/18 0558 12/27/18 0402  NA 138 139 137  K 4.0 4.3 4.2  CL 103 105 104  CO2 25 23 23   GLUCOSE 148* 153* 119*  BUN 26* 23 29*  CREATININE 2.04* 1.90* 2.18*  CALCIUM 9.2 8.5* 9.1   Liver Function Tests: Recent Labs  Lab 12/25/18 1441 12/26/18 0558  AST 36 27  ALT 27 22  ALKPHOS 92 76  BILITOT 0.8 0.8  PROT 7.3 6.2*  ALBUMIN 3.8 3.5   No results for input(s): LIPASE, AMYLASE in the last 168 hours. No results for input(s):  AMMONIA in the last 168 hours. CBC: Recent Labs  Lab 12/25/18 1441 12/26/18 0558  WBC 8.6 7.2  HGB 13.6 13.0  HCT 40.4 38.3*  MCV 91.2 91.4  PLT 189 176   Cardiac Enzymes: Recent Labs  Lab 12/25/18 1441 12/25/18 1804 12/25/18 2240 12/26/18 0558 12/26/18 1050  CKTOTAL  --   --  304  --   --   CKMB  --   --  6.4*  --   --   TROPONINI 3.05* 3.18* 3.78* 3.24* 3.18*   BNP (last 3 results) No results for input(s): BNP in the last 8760  hours.  ProBNP (last 3 results) No results for input(s): PROBNP in the last 8760 hours.  CBG: Recent Labs  Lab 12/26/18 0617 12/26/18 1123 12/26/18 1607 12/26/18 2131 12/27/18 0620  GLUCAP 135* 232* 174* 132* 132*    No results found for this or any previous visit (from the past 240 hour(s)).   Studies: Ct Head Wo Contrast  Result Date: 12/25/2018 CLINICAL DATA:  Slurred speech, facial droop EXAM: CT HEAD WITHOUT CONTRAST TECHNIQUE: Contiguous axial images were obtained from the base of the skull through the vertex without intravenous contrast. COMPARISON:  Report 01/13/2002 FINDINGS: Brain: No acute territorial infarction, hemorrhage or intracranial mass. Moderate atrophy. Mild small vessel ischemic changes of the white matter. Chronic appearing lacunar infarct within the left basal ganglia and white matter. Mildly prominent ventricles, felt secondary to atrophy. Vascular: No hyperdense vessels.  Carotid vascular calcification Skull: Normal. Negative for fracture or focal lesion. Sinuses/Orbits: Mild mucosal thickening in the ethmoid sinuses. Other: None IMPRESSION: 1. No CT evidence for acute intracranial abnormality. 2. Atrophy and mild small vessel ischemic changes of the white matter Electronically Signed   By: Donavan Foil M.D.   On: 12/25/2018 15:29   Dg Chest Port 1 View  Result Date: 12/25/2018 CLINICAL DATA:  Chest pain.  History of sleep apnea and CABG. EXAM: PORTABLE CHEST 1 VIEW COMPARISON:  Radiographs 06/09/2016 and 08/29/2015. FINDINGS: 1434 hours. Left subclavian AICD leads appear unchanged. The heart size and mediastinal contours are stable post median sternotomy and CABG. There are lower lung volumes with mildly increased atelectasis at both lung bases. No edema, confluent airspace opacity, pleural effusion or pneumothorax. The bones appear unchanged. Telemetry leads overlie the chest. IMPRESSION: Slightly lower lung volumes with resulting mild bibasilar atelectasis.  Otherwise stable postoperative chest without acute findings. Electronically Signed   By: Richardean Sale M.D.   On: 12/25/2018 15:13   Ct Angio Chest/abd/pel For Dissection W And/or Wo Contrast  Result Date: 12/25/2018 CLINICAL DATA:  Transient altered mental status and dysphasia earlier today. EXAM: CT ANGIOGRAPHY CHEST, ABDOMEN AND PELVIS TECHNIQUE: Multidetector CT imaging through the chest, abdomen and pelvis was performed using the standard protocol during bolus administration of intravenous contrast. Multiplanar reconstructed images and MIPs were obtained and reviewed to evaluate the vascular anatomy. CONTRAST:  37mL OMNIPAQUE IOHEXOL 300 MG/ML  SOLN COMPARISON:  Chest radiographs today and 06/09/2016. FINDINGS: CTA CHEST FINDINGS Cardiovascular: Pre contrast images demonstrate atherosclerosis of the aorta, great vessels and coronary arteries status post median sternotomy and CABG. There are no displaced intimal calcifications. Following contrast, the aortic lumen opacifies normally. There is no evidence of aneurysm or dissection. There is suboptimal opacification of the pulmonary arteries, but no evidence of pulmonary embolism. Patient has a pacemaker. The heart is mildly enlarged. No significant pericardial effusion. Mediastinum/Nodes: There are mildly enlarged mediastinal lymph nodes, including an 11 mm right paratracheal node on  image 27/8 and subcarinal nodes measuring 13 mm on image 63/8 and 13 mm on image 69/8. No hilar or axillary lymphadenopathy. The thyroid gland, trachea and esophagus demonstrate no significant findings. Lungs/Pleura: There is no pleural effusion or pneumothorax. Mild subpleural reticulation is present within both lungs which appears chronic. There is mild atelectasis at the right lung base. No confluent airspace opacity or suspicious pulmonary nodule. Musculoskeletal/Chest wall: There is no chest wall mass or suspicious osseous finding. There is nonunion of the median  sternotomy, although the sternotomy wires are intact. Review of the MIP images confirms the above findings. CTA ABDOMEN AND PELVIS FINDINGS VASCULAR Aorta: Mild atherosclerosis without evidence of aneurysm, dissection or stenosis. Celiac: Mild ostial stenosis. Otherwise widely patent with conventional anatomy. SMA: Proximal atherosclerosis without stenosis. Renals: Atherosclerosis without stenosis. IMA: Patent. Inflow: Mild atherosclerosis without stenosis. Veins: Limited opacification.  No suggested acute findings. Review of the MIP images confirms the above findings. NON-VASCULAR Hepatobiliary: No focal hepatic abnormalities or abnormal enhancement. There are small gallstones. No evidence of gallbladder wall thickening or biliary dilatation. Pancreas: Unremarkable. No evidence of pancreatic mass, ductal dilatation or surrounding inflammation. Spleen: Normal in size without focal abnormality. Adrenals/Urinary Tract: Both adrenal glands appear normal. Both kidneys demonstrate mild cortical thinning, but no abnormal enhancement or calculi. There is no hydronephrosis. The bladder appears unremarkable. Stomach/Bowel: No evidence of bowel wall thickening, distention or surrounding inflammation. The appendix appears normal. Mild sigmoid diverticulosis. Lymphatic: There are no enlarged abdominopelvic lymph nodes. Reproductive: The prostate gland and seminal vesicles appear normal. Other: Small left inguinal hernia containing only fat. No ascites. Postsurgical changes in the anterior abdominal wall. Musculoskeletal: No acute or suspicious osseous findings. There is multilevel thoracolumbar scoliosis. There is a sclerotic lesion laterally in the left 8th rib which is likely an incidental bone island. Review of the MIP images confirms the above findings. IMPRESSION: 1. No acute vascular findings. There is aortic and branch vessel atherosclerosis with mild ostial stenosis of the celiac trunk. No large vessel occlusion,  aneurysm or dissection. 2. No evidence of acute pulmonary embolism. 3. Small mediastinal lymph nodes, likely reactive. 4. Cholelithiasis without evidence of cholecystitis. 5. Postsurgical changes as described. Electronically Signed   By: Richardean Sale M.D.   On: 12/25/2018 17:43   Vas US Carotid (at [place] Only)  Result Date: 12/26/2018 Carotid Arterial Duplex Study Indications: TIA, Speech disturbance and Weakness. Performing Technologist: Toma Copier RVS  Examination Guidelines: A complete evaluation includes B-mode imaging, spectral Doppler, color Doppler, and power Doppler as needed of all accessible portions of each vessel. Bilateral testing is considered an integral part of a complete examination. Limited examinations for reoccurring indications may be performed as noted.  Right Carotid Findings: +----------+--------+--------+--------+------------+---------------------------+           PSV cm/sEDV cm/sStenosisDescribe    Comments                    +----------+--------+--------+--------+------------+---------------------------+ CCA Prox  79      8                                                       +----------+--------+--------+--------+------------+---------------------------+ CCA Distal89      13                                                      +----------+--------+--------+--------+------------+---------------------------+  ICA Prox  166     15      1-39%   heterogenousmild plaque with acoustic                                                 shadowing                   +----------+--------+--------+--------+------------+---------------------------+ ICA Mid   113     15                                                      +----------+--------+--------+--------+------------+---------------------------+ ICA Distal90      18                                                       +----------+--------+--------+--------+------------+---------------------------+ ECA       255     9               heterogenousmild plaque                 +----------+--------+--------+--------+------------+---------------------------+ +----------+--------+-------+--------+-------------------+           PSV cm/sEDV cmsDescribeArm Pressure (mmHG) +----------+--------+-------+--------+-------------------+ [id number]                                         +----------+--------+-------+--------+-------------------+ +---------+--------+--+--------+-+ VertebralPSV cm/s27EDV cm/s5 +---------+--------+--+--------+-+ Acoustic shadowing in the ICA may obscure higher velocities  Left Carotid Findings: +----------+-------+--------+--------+------------+----------------------------+           PSV    EDV cm/sStenosisDescribe    Comments                               cm/s                                                            +----------+-------+--------+--------+------------+----------------------------+ CCA Prox  67     12                                                       +----------+-------+--------+--------+------------+----------------------------+ CCA Distal102    15              heterogenousmild plaque                  +----------+-------+--------+--------+------------+----------------------------+ ICA Prox  161    28      1-39%   heterogenousplaque with acoustic  shadowing                    +----------+-------+--------+--------+------------+----------------------------+ ICA Mid   132    19                                                       +----------+-------+--------+--------+------------+----------------------------+ ICA Distal107    27                                                       +----------+-------+--------+--------+------------+----------------------------+ ECA        370    5               heterogenousmoderate plaque              +----------+-------+--------+--------+------------+----------------------------+ +----------+--------+--------+--------+-------------------+ SubclavianPSV cm/sEDV cm/sDescribeArm Pressure (mmHG) +----------+--------+--------+--------+-------------------+           167                                         +----------+--------+--------+--------+-------------------+ +---------+--------+--+--------+-+ VertebralPSV cm/s38EDV cm/s8 +---------+--------+--+--------+-+ Acousticshadowing may obscue higher velocities  Summary: Right Carotid: Velocities in the right ICA are consistent with a 1-39% stenosis.                See technical comments listed above. Left Carotid: Velocities in the left ICA are consistent with a 1-39% stenosis.               See technical comments listed above. Vertebrals:  Bilateral vertebral arteries demonstrate antegrade flow. Subclavians: Normal flow hemodynamics were seen in bilateral subclavian              arteries. *See table(s) above for measurements and observations.     Preliminary     Scheduled Meds: . allopurinol  150 mg Oral Daily  . aspirin EC  81 mg Oral Daily  . atorvastatin  40 mg Oral Daily  . clopidogrel  75 mg Oral Daily  . gabapentin  300 mg Oral BID  . hydrALAZINE  10 mg Oral Q8H  . insulin aspart  0-5 Units Subcutaneous QHS  . insulin aspart  0-9 Units Subcutaneous TID WC  . insulin aspart  15 Units Subcutaneous BID WC  . insulin NPH Human  20 Units Subcutaneous BID AC  . metoprolol succinate  50 mg Oral Daily   Continuous Infusions: . sodium chloride 75 mL/hr at 12/27/18 1107    Principal Problem:   Syncope Active Problems:   CKD (chronic kidney disease) stage 4, GFR 15-29 ml/min (HCC)   Left-sided weakness   Stroke-like symptoms   Cerebral embolism with cerebral infarction   Type 2 diabetes, uncontrolled, with neuropathy (HCC)   CAD (coronary artery  disease)   Elevated troponin    Time spent: 45 minutes    Purcell NP [facility] If 7PM-7AM, please contact night-coverage at [email or web address], password [facility] 12/27/2018, 11:46 AM  LOS: 1 day

## 2018-12-27 NOTE — Progress Notes (Signed)
STROKE TEAM PROGRESS NOTE   INTERVAL HISTORY Patient states that he still has some residual numbness on the ulnar aspect of the left hand but otherwise is doing fine.  His creatinine has worsened this morning to 2.18 hence we will cancel plans to get CT angios and will get transcranial Doppler instead.  Patient can be discharged home after that.  Vitals:   12/26/18 2043 12/26/18 2324 12/27/18 0437 12/27/18 0823  BP: (!) 142/83 134/75 138/67 132/66  Pulse: 60 62 60 60  Resp: 18 18 18 17   Temp: 97.9 F (36.6 C) 98.1 F (36.7 C) 97.7 F (36.5 C) 97.7 F (36.5 C)  TempSrc: Oral Oral Oral Oral  SpO2: 100% 95% 100% 99%  Weight:      Height:        CBC:  Recent Labs  Lab 12/25/18 1441 12/26/18 0558  WBC 8.6 7.2  HGB 13.6 13.0  HCT 40.4 38.3*  MCV 91.2 91.4  PLT 189 545    Basic Metabolic Panel:  Recent Labs  Lab 12/26/18 0558 12/27/18 0402  NA 139 137  K 4.3 4.2  CL 105 104  CO2 23 23  GLUCOSE 153* 119*  BUN 23 29*  CREATININE 1.90* 2.18*  CALCIUM 8.5* 9.1   Lipid Panel:     Component Value Date/Time   CHOL 100 12/26/2018 0558   CHOL 121 07/19/2018 0832   TRIG 179 (H) 12/26/2018 0558   HDL 29 (L) 12/26/2018 0558   HDL 35 (L) 07/19/2018 0832   CHOLHDL 3.4 12/26/2018 0558   VLDL 36 12/26/2018 0558   LDLCALC 35 12/26/2018 0558   LDLCALC 45 07/19/2018 0832   HgbA1c:  Lab Results  Component Value Date   HGBA1C 7.3 (H) 12/26/2018   IMAGING Ct Head Wo Contrast  Result Date: 12/25/2018 CLINICAL DATA:  Slurred speech, facial droop EXAM: CT HEAD WITHOUT CONTRAST TECHNIQUE: Contiguous axial images were obtained from the base of the skull through the vertex without intravenous contrast. COMPARISON:  Report 01/13/2002 FINDINGS: Brain: No acute territorial infarction, hemorrhage or intracranial mass. Moderate atrophy. Mild small vessel ischemic changes of the white matter. Chronic appearing lacunar infarct within the left basal ganglia and white matter. Mildly prominent  ventricles, felt secondary to atrophy. Vascular: No hyperdense vessels.  Carotid vascular calcification Skull: Normal. Negative for fracture or focal lesion. Sinuses/Orbits: Mild mucosal thickening in the ethmoid sinuses. Other: None IMPRESSION: 1. No CT evidence for acute intracranial abnormality. 2. Atrophy and mild small vessel ischemic changes of the white matter Electronically Signed   By: Donavan Foil M.D.   On: 12/25/2018 15:29   Dg Chest Port 1 View  Result Date: 12/25/2018 CLINICAL DATA:  Chest pain.  History of sleep apnea and CABG. EXAM: PORTABLE CHEST 1 VIEW COMPARISON:  Radiographs 06/09/2016 and 08/29/2015. FINDINGS: 1434 hours. Left subclavian AICD leads appear unchanged. The heart size and mediastinal contours are stable post median sternotomy and CABG. There are lower lung volumes with mildly increased atelectasis at both lung bases. No edema, confluent airspace opacity, pleural effusion or pneumothorax. The bones appear unchanged. Telemetry leads overlie the chest. IMPRESSION: Slightly lower lung volumes with resulting mild bibasilar atelectasis. Otherwise stable postoperative chest without acute findings. Electronically Signed   By: Richardean Sale M.D.   On: 12/25/2018 15:13   Ct Angio Chest/abd/pel For Dissection W And/or Wo Contrast  Result Date: 12/25/2018 CLINICAL DATA:  Transient altered mental status and dysphasia earlier today. EXAM: CT ANGIOGRAPHY CHEST, ABDOMEN AND PELVIS TECHNIQUE: Multidetector CT  imaging through the chest, abdomen and pelvis was performed using the standard protocol during bolus administration of intravenous contrast. Multiplanar reconstructed images and MIPs were obtained and reviewed to evaluate the vascular anatomy. CONTRAST:  73mL OMNIPAQUE IOHEXOL 300 MG/ML  SOLN COMPARISON:  Chest radiographs today and 06/09/2016. FINDINGS: CTA CHEST FINDINGS Cardiovascular: Pre contrast images demonstrate atherosclerosis of the aorta, great vessels and coronary  arteries status post median sternotomy and CABG. There are no displaced intimal calcifications. Following contrast, the aortic lumen opacifies normally. There is no evidence of aneurysm or dissection. There is suboptimal opacification of the pulmonary arteries, but no evidence of pulmonary embolism. Patient has a pacemaker. The heart is mildly enlarged. No significant pericardial effusion. Mediastinum/Nodes: There are mildly enlarged mediastinal lymph nodes, including an 11 mm right paratracheal node on image 27/8 and subcarinal nodes measuring 13 mm on image 63/8 and 13 mm on image 69/8. No hilar or axillary lymphadenopathy. The thyroid gland, trachea and esophagus demonstrate no significant findings. Lungs/Pleura: There is no pleural effusion or pneumothorax. Mild subpleural reticulation is present within both lungs which appears chronic. There is mild atelectasis at the right lung base. No confluent airspace opacity or suspicious pulmonary nodule. Musculoskeletal/Chest wall: There is no chest wall mass or suspicious osseous finding. There is nonunion of the median sternotomy, although the sternotomy wires are intact. Review of the MIP images confirms the above findings. CTA ABDOMEN AND PELVIS FINDINGS VASCULAR Aorta: Mild atherosclerosis without evidence of aneurysm, dissection or stenosis. Celiac: Mild ostial stenosis. Otherwise widely patent with conventional anatomy. SMA: Proximal atherosclerosis without stenosis. Renals: Atherosclerosis without stenosis. IMA: Patent. Inflow: Mild atherosclerosis without stenosis. Veins: Limited opacification.  No suggested acute findings. Review of the MIP images confirms the above findings. NON-VASCULAR Hepatobiliary: No focal hepatic abnormalities or abnormal enhancement. There are small gallstones. No evidence of gallbladder wall thickening or biliary dilatation. Pancreas: Unremarkable. No evidence of pancreatic mass, ductal dilatation or surrounding inflammation. Spleen:  Normal in size without focal abnormality. Adrenals/Urinary Tract: Both adrenal glands appear normal. Both kidneys demonstrate mild cortical thinning, but no abnormal enhancement or calculi. There is no hydronephrosis. The bladder appears unremarkable. Stomach/Bowel: No evidence of bowel wall thickening, distention or surrounding inflammation. The appendix appears normal. Mild sigmoid diverticulosis. Lymphatic: There are no enlarged abdominopelvic lymph nodes. Reproductive: The prostate gland and seminal vesicles appear normal. Other: Small left inguinal hernia containing only fat. No ascites. Postsurgical changes in the anterior abdominal wall. Musculoskeletal: No acute or suspicious osseous findings. There is multilevel thoracolumbar scoliosis. There is a sclerotic lesion laterally in the left 8th rib which is likely an incidental bone island. Review of the MIP images confirms the above findings. IMPRESSION: 1. No acute vascular findings. There is aortic and branch vessel atherosclerosis with mild ostial stenosis of the celiac trunk. No large vessel occlusion, aneurysm or dissection. 2. No evidence of acute pulmonary embolism. 3. Small mediastinal lymph nodes, likely reactive. 4. Cholelithiasis without evidence of cholecystitis. 5. Postsurgical changes as described. Electronically Signed   By: Richardean Sale M.D.   On: 12/25/2018 17:43   Vas US Carotid (at [place] Only)  Result Date: 12/26/2018 Carotid Arterial Duplex Study Indications: TIA, Speech disturbance and Weakness. Performing Technologist: Toma Copier RVS  Examination Guidelines: A complete evaluation includes B-mode imaging, spectral Doppler, color Doppler, and power Doppler as needed of all accessible portions of each vessel. Bilateral testing is considered an integral part of a complete examination. Limited examinations for reoccurring indications may be performed as  noted.  Right Carotid Findings:  +----------+--------+--------+--------+------------+---------------------------+           PSV cm/sEDV cm/sStenosisDescribe    Comments                    +----------+--------+--------+--------+------------+---------------------------+ CCA Prox  79      8                                                       +----------+--------+--------+--------+------------+---------------------------+ CCA Distal89      13                                                      +----------+--------+--------+--------+------------+---------------------------+ ICA Prox  166     15      1-39%   heterogenousmild plaque with acoustic                                                 shadowing                   +----------+--------+--------+--------+------------+---------------------------+ ICA Mid   113     15                                                      +----------+--------+--------+--------+------------+---------------------------+ ICA Distal90      18                                                      +----------+--------+--------+--------+------------+---------------------------+ ECA       255     9               heterogenousmild plaque                 +----------+--------+--------+--------+------------+---------------------------+ +----------+--------+-------+--------+-------------------+           PSV cm/sEDV cmsDescribeArm Pressure (mmHG) +----------+--------+-------+--------+-------------------+ [id number]                                         +----------+--------+-------+--------+-------------------+ +---------+--------+--+--------+-+ VertebralPSV cm/s27EDV cm/s5 +---------+--------+--+--------+-+ Acoustic shadowing in the ICA may obscure higher velocities  Left Carotid Findings: +----------+-------+--------+--------+------------+----------------------------+           PSV    EDV cm/sStenosisDescribe    Comments                                cm/s                                                            +----------+-------+--------+--------+------------+----------------------------+  CCA Prox  67     12                                                       +----------+-------+--------+--------+------------+----------------------------+ CCA Distal102    15              heterogenousmild plaque                  +----------+-------+--------+--------+------------+----------------------------+ ICA Prox  161    28      1-39%   heterogenousplaque with acoustic                                                      shadowing                    +----------+-------+--------+--------+------------+----------------------------+ ICA Mid   132    19                                                       +----------+-------+--------+--------+------------+----------------------------+ ICA Distal107    27                                                       +----------+-------+--------+--------+------------+----------------------------+ ECA       370    5               heterogenousmoderate plaque              +----------+-------+--------+--------+------------+----------------------------+ +----------+--------+--------+--------+-------------------+ SubclavianPSV cm/sEDV cm/sDescribeArm Pressure (mmHG) +----------+--------+--------+--------+-------------------+           167                                         +----------+--------+--------+--------+-------------------+ +---------+--------+--+--------+-+ VertebralPSV cm/s38EDV cm/s8 +---------+--------+--+--------+-+ Acousticshadowing may obscue higher velocities  Summary: Right Carotid: Velocities in the right ICA are consistent with a 1-39% stenosis.                See technical comments listed above. Left Carotid: Velocities in the left ICA are consistent with a 1-39% stenosis.               See technical comments listed above. Vertebrals:   Bilateral vertebral arteries demonstrate antegrade flow. Subclavians: Normal flow hemodynamics were seen in bilateral subclavian              arteries. *See table(s) above for measurements and observations.     Preliminary    2D echo with bubble  1. The left ventricle has normal systolic function, with an ejection fraction of 55-60%. The cavity size was normal. There is mildly increased left ventricular wall thickness. Left ventricular diastolic Doppler parameters are consistent with impaired  relaxation. No evidence of left ventricular regional wall motion abnormalities.  2.  There is mild mitral annular calcification present. No evidence of mitral valve stenosis. No significant mitral regurgitation.  3. The aortic valve is tricuspid Mild calcification of the aortic valve. No aortic stenosis.  4. The aortic root is normal in size and structure.  5. The right ventricle has normal systolc function. The cavity was normal.  6. The IVC was not well-visualized. Peak RV-RA gradient 35 mmHg.   PHYSICAL EXAM Pleasant obese elderly male not in distress . Afebrile. Head is nontraumatic. Neck is supple without bruit.    Cardiac exam no murmur or gallop. Lungs are clear to auscultation. Distal pulses are well felt. Neurological Exam ;  Awake  Alert oriented x 3. Normal speech and language.eye movements full without nystagmus.fundi were not visualized. Vision acuity and fields appear normal. Hearing is normal. Palatal movements are normal. Face symmetric. Tongue midline. Normal strength, tone, reflexes and coordination. Normal sensation. Gait deferred.  ASSESSMENT/PLAN  Manuel Kline is a 85 y.o. male with history of CAD, cardiomyopathy, DM, HLD, HTN, implantable cardiac defibrillator, metabolic syndrome, CABG, OSA presenting from driving church with facial droop, slurred speech, left-sided weakness and confusion.   Right brain TIA  CT head No acute stroke. Small vessel disease. Atrophy.   CTA chest  no dissection.  Small mediastinal lymph nodes likely 1-39% biltateral carotid stenosis   2D Echo with bubble  EF 55-60%. No source of embolus   Carotid doppler B ICA 1-39% stenosis, VAs antegrade   Check TCD    LDL 35  HgbA1c 7.3  SCDs for VTE prophylaxis  aspirin 81 mg daily prior to admission, now on aspirin 81 mg daily and clopidogrel 75 mg daily . Continue x 3 weeks, then PLAVIX alone.   Therapy recommendations: HH PT  Disposition: Return home with family  Follow-up in the stroke clinic in 4 weeks.    Hypertension  Stable . BP goal normotensive  Hyperlipidemia  Home meds: Lipitor 80 and fish oil  LDL 35, at goal < 70  Lipitor decreased to 40 mg daily  Continue statin at discharge  Diabetes type II Uncontrolled Diabetic neuropathy  HgbA1c 7.3, goal < 7.0  Other Stroke Risk Factors  Advanced age  Morbid obesity, Body mass index is 45.68 kg/m., recommend weight loss, diet and exercise as appropriate   Coronary artery disease s/p CABG  Obstructive sleep apnea  Cardiomyopathy with ICD  Other Active Problems  Chronic kidney disease stage IV, Cr 2.18  History of gout  Hospital day # 1     He presented with left upper extremity numbness which appears to have resolved almost completely and likely had a TIA.  Check Transcranial dopplers instead of CTA due to impaired renal function.Recommend aspirin and Plavix given his history of acute MI.  Aggressive risk factor modification.  Discussed with patient and Dr. Eliseo Squires.  Greater than 50% time during this 25-minute visit was spent on counseling and coordination of care about TIA and discussion about stroke prevention and treatment and answering questions.  Manuel Contras, MD Medical Director [facility] Pager: [phone] 12/27/2018 11:57 AM   To contact Stroke Continuity provider, please refer to [email or web address] After hours, contact General Neurology

## 2018-12-27 NOTE — Patient Outreach (Signed)
[facility]) Care Management  12/27/2018  Manuel Kline August 20, 1932 [id number]    [facility] notified that the patient was admitted to the hospital on 12/25/18 for Neurological deficit.  Hospital liaison will follow the patient.  Plan:  RN Health Coach will close the program due to admission and send closure letter to primary care.  [name] RN, BSN, [place] Direct Dial:  [phone]  Fax: [phone]

## 2018-12-27 NOTE — Consult Note (Signed)
   [facility] Inpatient Consult   12/27/2018  Manuel Kline [birth year] [id number]  1525 Spoke with the patient via telephone in hospital room.  HIPAA verified.  Patient agrees with ongoing [facility] follow up with community care management.  Patient is currently active with [facility] for chronic disease management services.  Patient has been engaged by a [place] for Diabetes Management.  Chart review and progress notes reveals as follows froom MD history and Physical: Manuel Kline  is a 85 y.o. male,  w CAD s/p CABG, CHF (EF 45%), h/o ICD, Dm2, Neuropathy, apparently was at church sitting in car when he was noted to have AMS, ? Syncope,  Pt states he was passed out for a few minutes.  Pt states had slurred speech, left upper extremity weakness lasting for a few minutes.  CT was negative admitted and DX with right sided TIA.    Our community based plan of care has focused on disease management and community resource support.  Patient will receive a post hospital call and will be evaluated for assessments and disease process education. Will follow patient for progress and with  Inpatient [facility] team member to make aware that [place] [facility] following.   Of note, [facility] Care Management services does not replace or interfere with any services that are needed or arranged by inpatient case management or social work.  For additional questions or referrals please contact:   Natividad Brood, RN BSN [facility] Liaison  [phone] business mobile phone Toll free office [phone]

## 2018-12-27 NOTE — Progress Notes (Signed)
Physical Therapy Treatment Patient Details Name: Manuel Kline MRN: [id number] DOB: August 10, 1932 Today's Date: 12/27/2018    History of Present Illness Manuel Kline  is a 85 y.o. male,  w CAD s/p CABG, CHF (EF 45%), h/o ICD, Dm2, Neuropathy, apparently was at church sitting in car when he was noted to have AMS and syncope, He may have had some "facial droop, slurred speech, and left sided weakness" per report. CT-negative, tropin noted to be up on admission-started trending down this AM    PT Comments    Pt progressing towards goals. Practiced dynamic gait tasks during gait. Pt with LOB X 2 requiring min A for steadying. Also performed standing balance exercises. Educated again about using RW at home to increase steadiness. Current recommendations appropriate. Will continue to follow acutely to maximize functional mobility independence and safety.     Follow Up Recommendations  Home health PT;Supervision - Intermittent     Equipment Recommendations  None recommended by PT    Recommendations for Other Services       Precautions / Restrictions Precautions Precautions: Fall Restrictions Weight Bearing Restrictions: No    Mobility  Bed Mobility               General bed mobility comments: In chair upon entry.   Transfers Overall transfer level: Needs assistance Equipment used: None Transfers: Sit to/from Stand Sit to Stand: Min guard         General transfer comment: min guard for safety.   Ambulation/Gait Ambulation/Gait assistance: Min guard;Min assist Gait Distance (Feet): 250 Feet Assistive device: None Gait Pattern/deviations: Step-to pattern;Decreased stride length;Trunk flexed;Drifts right/left Gait velocity: decreased   General Gait Details: Practiced dynamic gait tasks at home. Noted LOB during horizontal head turns and LOB when turning. Required min A for steadying with LOB. Otherwise required min guard for steadying. Educated about using RW for mobility at  home.    Stairs             Wheelchair Mobility    Modified Rankin (Stroke Patients Only)       Balance Overall balance assessment: Mild deficits observed, not formally tested                                          Cognition Arousal/Alertness: Awake/alert Behavior During Therapy: WFL for tasks assessed/performed Overall Cognitive Status: Within Functional Limits for tasks assessed                                        Exercises General Exercises - Lower Extremity Hip Flexion/Marching: AROM;Both;10 reps;Standing(min A for steadying ) Toe Raises: AROM;Both;10 reps;Standing(min A; cues for technique ) Heel Raises: AROM;Both;10 reps;Standing(min A; cues for technique )    General Comments        Pertinent Vitals/Pain Pain Assessment: No/denies pain    Home Living                      Prior Function            PT Goals (current goals can now be found in the care plan section) Acute Rehab PT Goals Patient Stated Goal: to go home today PT Goal Formulation: With patient Time For Goal Achievement: 01/02/19 Potential to Achieve Goals: Good Progress towards PT goals: Progressing  toward goals    Frequency    Min 3X/week      PT Plan Current plan remains appropriate    Co-evaluation              AM-PAC PT "6 Clicks" Mobility   Outcome Measure  Help needed turning from your back to your side while in a flat bed without using bedrails?: A Little Help needed moving from lying on your back to sitting on the side of a flat bed without using bedrails?: A Little Help needed moving to and from a bed to a chair (including a wheelchair)?: A Little Help needed standing up from a chair using your arms (e.g., wheelchair or bedside chair)?: A Little Help needed to walk in hospital room?: A Little Help needed climbing 3-5 steps with a railing? : A Little 6 Click Score: 18    End of Session Equipment Utilized  During Treatment: Gait belt Activity Tolerance: Patient tolerated treatment well Patient left: in chair;with call bell/phone within reach;with chair alarm set Nurse Communication: Mobility status PT Visit Diagnosis: Unsteadiness on feet (R26.81);Other abnormalities of gait and mobility (R26.89);Muscle weakness (generalized) (M62.81);History of falling (Z91.81)     Time: 9038-3338 PT Time Calculation (min) (ACUTE ONLY): 20 min  Charges:  $Gait Training: 8-22 mins                     Leighton Ruff, PT, DPT  Acute Rehabilitation Services  Pager: [phone] Office: [phone]    Rudean Hitt 12/27/2018, 5:00 PM

## 2018-12-27 NOTE — Discharge Summary (Addendum)
Physician Discharge Summary  ISAM UNREIN VWU:[id number] DOB: 1932-03-21 DOA: 12/25/2018  PCP: Worthy Rancher, MD  Admit date: 12/25/2018 Discharge date: 12/27/2018  Time spent: 45 minutes  Recommendations for Outpatient Follow-up:  Follow up with stroke clinic in 4 weeks-  transcranial doppler pending at time of d/c-- will need to be done at neurology appointment in May aspirin 81 mg daily and clopidogrel 75 mg daily . Continue x 3 weeks, then PLAVIX alone.  -home health    Discharge Diagnoses:  Principal Problem:   Syncope Active Problems:   CKD (chronic kidney disease) stage 4, GFR 15-29 ml/min (HCC)   Left-sided weakness   Stroke-like symptoms   Cerebral embolism with cerebral infarction   Type 2 diabetes, uncontrolled, with neuropathy (HCC)   CAD (coronary artery disease)   Elevated troponin   Discharge Condition: stable  Diet recommendation: carb modified heart healthy  Filed Weights   12/25/18 1415  Weight: 106.1 kg    History of present illness:  Manuel Kline  is a 85 y.o. male,  w CAD s/p CABG, CHF (EF 45%), h/o ICD, Dm2, Neuropathy presented 4/26 after apparent syncope episode at church sitting in car.  Pt stated he was passed out for a few minutes.  Pt stated had slurred speech, left upper extremity weakness lasting for a few minutes. Pt denied any presyncopal symptoms.  Pt denied fever, chills, cough, cp, palp, sob, n/v, abd pain, diarrhea, brbpr.   Pt  not sure what his sugar was during this episode.  Has had some hypoglycemia in the past.  Pt denied any recent medication changes  Hospital Course:   Syncope, AMS, Left sided weakness/ slurred speech. Symptoms resolved.. No events on tele. Echo with EF 55%, mildly increased left ventricular wall thickness, no evidence of wall motion abnormalities. Carotid dopplar with bilateral ICA consistent with 1-39% stenosis.  CT head no acute intracranial abnormality. No MRI due to ICD. Provide with asa and statin. Evaluated by  neuro who recommend transcranial doppler but this was not able to be done in-hospital. Also recommend 19m asa + Plavix for 3 weeks then just Plavix. B12, TSH and RPR within limits of normal. Sed rate 27. HgA1c 7.3. PT recommends HH PT. Speech no further therapy. Follow up with stroke clinic 4 weeks  Elevated troponin ddx secondary to Syncope/ CVA ddx ACS, renal insufficiency. No chest pain. No EKG changes. Troponin elevated but flat. Evaluated by cardiology who opine could be related to neuro event. Recommends no further invasive management and continue with aggressive secondary risk factor prevention.  -continue asa -continue statin  H/o CHF (EF 45%)echo as noted above remained compensated.   Dm2hgA1c 7.3.  Diabetic neuropathy stable at baseline Cont Gabapentin 3063mpo bid  CKD stage 4creatinine trending up this am but close to baseline.  ? H/o Gout stable    Procedures:  Echo  Transcranial doppler  Consultations:  sethi neuro  skains cards  Discharge Exam: Vitals:   12/27/18 0823 12/27/18 1217  BP: 132/66 (!) 163/75  Pulse: 60 (!) 59  Resp: 17 17  Temp: 97.7 F (36.5 C) 97.8 F (36.6 C)  SpO2: 99% 100%    General: sitting in chair watching tv no acute distress Cardiovascular: rrr no mgr no LE edema Respiratory: normal effort BS distant but clear  Discharge Instructions    Allergies as of 12/27/2018   No Known Allergies     Medication List    STOP taking these medications   aspirin 81 MG  tablet Replaced by:  aspirin 81 MG EC tablet     TAKE these medications   allopurinol 300 MG tablet Commonly known as:  ZYLOPRIM TAKE 1/2 (ONE-HALF) TABLET BY MOUTH ONCE DAILY What changed:    how much to take  how to take this  when to take this  additional instructions   aspirin 81 MG EC tablet Take 1 tablet (81 mg total) by mouth daily for 20 days. Start taking on:  December 28, 2018 Replaces:  aspirin 81 MG tablet   atorvastatin 80 MG  tablet Commonly known as:  LIPITOR Take 0.5 tablets (40 mg total) by mouth daily.   cloNIDine 0.1 MG tablet Commonly known as:  CATAPRES TAKE 1 TABLET BY MOUTH TWICE DAILY   clopidogrel 75 MG tablet Commonly known as:  PLAVIX Take 1 tablet (75 mg total) by mouth daily. Start taking on:  December 28, 2018   Fish Oil 1000 MG Cpdr Take 1 tablet by mouth daily.   furosemide 40 MG tablet Commonly known as:  LASIX Take 1 tablet (40 mg total) by mouth 2 (two) times daily.   gabapentin 300 MG capsule Commonly known as:  NEURONTIN Take 1 capsule (300 mg total) by mouth 2 (two) times daily.   glucose blood test strip Commonly known as:  Accu-Chek Guide 1 each by Other route 5 (five) times daily. Use as instructed   glucose monitoring kit monitoring kit 1 each by Does not apply route as needed for other.   insulin NPH Human 100 UNIT/ML injection Commonly known as:  HumuLIN N INJECT 35 UNITS BID What changed:    how much to take  how to take this  when to take this  additional instructions   Insulin Pen Needle 29G X 12.7MM Misc Use ti inject insulin 5 times a day   insulin regular 100 units/mL injection Commonly known as:  HumuLIN R Inject 0.3 mLs (30 Units total) into the skin 2 (two) times daily before a meal.   metoprolol succinate 50 MG 24 hr tablet Commonly known as:  TOPROL-XL Take 1 tablet (50 mg total) by mouth daily.   nitroGLYCERIN 0.4 MG SL tablet Commonly known as:  NITROSTAT Place 0.4 mg under the tongue every 5 (five) minutes as needed for chest pain.   polyethylene glycol powder 17 GM/SCOOP powder Commonly known as:  GLYCOLAX/MIRALAX Take 17 g by mouth 2 (two) times daily as needed.   quinapril 40 MG tablet Commonly known as:  ACCUPRIL TAKE 1 TABLET BY MOUTH AT BEDTIME What changed:  when to take this   VITAMIN B 12 PO Take 1,000 mcg by mouth daily.      No Known Allergies    The results of significant diagnostics from this hospitalization  (including imaging, microbiology, ancillary and laboratory) are listed below for reference.    Significant Diagnostic Studies: Ct Head Wo Contrast  Result Date: 12/25/2018 CLINICAL DATA:  Slurred speech, facial droop EXAM: CT HEAD WITHOUT CONTRAST TECHNIQUE: Contiguous axial images were obtained from the base of the skull through the vertex without intravenous contrast. COMPARISON:  Report 01/13/2002 FINDINGS: Brain: No acute territorial infarction, hemorrhage or intracranial mass. Moderate atrophy. Mild small vessel ischemic changes of the white matter. Chronic appearing lacunar infarct within the left basal ganglia and white matter. Mildly prominent ventricles, felt secondary to atrophy. Vascular: No hyperdense vessels.  Carotid vascular calcification Skull: Normal. Negative for fracture or focal lesion. Sinuses/Orbits: Mild mucosal thickening in the ethmoid sinuses. Other: None IMPRESSION: 1.  No CT evidence for acute intracranial abnormality. 2. Atrophy and mild small vessel ischemic changes of the white matter Electronically Signed   By: Donavan Foil M.D.   On: 12/25/2018 15:29   Dg Chest Port 1 View  Result Date: 12/25/2018 CLINICAL DATA:  Chest pain.  History of sleep apnea and CABG. EXAM: PORTABLE CHEST 1 VIEW COMPARISON:  Radiographs 06/09/2016 and 08/29/2015. FINDINGS: 1434 hours. Left subclavian AICD leads appear unchanged. The heart size and mediastinal contours are stable post median sternotomy and CABG. There are lower lung volumes with mildly increased atelectasis at both lung bases. No edema, confluent airspace opacity, pleural effusion or pneumothorax. The bones appear unchanged. Telemetry leads overlie the chest. IMPRESSION: Slightly lower lung volumes with resulting mild bibasilar atelectasis. Otherwise stable postoperative chest without acute findings. Electronically Signed   By: Richardean Sale M.D.   On: 12/25/2018 15:13   Ct Angio Chest/abd/pel For Dissection W And/or Wo  Contrast  Result Date: 12/25/2018 CLINICAL DATA:  Transient altered mental status and dysphasia earlier today. EXAM: CT ANGIOGRAPHY CHEST, ABDOMEN AND PELVIS TECHNIQUE: Multidetector CT imaging through the chest, abdomen and pelvis was performed using the standard protocol during bolus administration of intravenous contrast. Multiplanar reconstructed images and MIPs were obtained and reviewed to evaluate the vascular anatomy. CONTRAST:  82m OMNIPAQUE IOHEXOL 300 MG/ML  SOLN COMPARISON:  Chest radiographs today and 06/09/2016. FINDINGS: CTA CHEST FINDINGS Cardiovascular: Pre contrast images demonstrate atherosclerosis of the aorta, great vessels and coronary arteries status post median sternotomy and CABG. There are no displaced intimal calcifications. Following contrast, the aortic lumen opacifies normally. There is no evidence of aneurysm or dissection. There is suboptimal opacification of the pulmonary arteries, but no evidence of pulmonary embolism. Patient has a pacemaker. The heart is mildly enlarged. No significant pericardial effusion. Mediastinum/Nodes: There are mildly enlarged mediastinal lymph nodes, including an 11 mm right paratracheal node on image 27/8 and subcarinal nodes measuring 13 mm on image 63/8 and 13 mm on image 69/8. No hilar or axillary lymphadenopathy. The thyroid gland, trachea and esophagus demonstrate no significant findings. Lungs/Pleura: There is no pleural effusion or pneumothorax. Mild subpleural reticulation is present within both lungs which appears chronic. There is mild atelectasis at the right lung base. No confluent airspace opacity or suspicious pulmonary nodule. Musculoskeletal/Chest wall: There is no chest wall mass or suspicious osseous finding. There is nonunion of the median sternotomy, although the sternotomy wires are intact. Review of the MIP images confirms the above findings. CTA ABDOMEN AND PELVIS FINDINGS VASCULAR Aorta: Mild atherosclerosis without evidence of  aneurysm, dissection or stenosis. Celiac: Mild ostial stenosis. Otherwise widely patent with conventional anatomy. SMA: Proximal atherosclerosis without stenosis. Renals: Atherosclerosis without stenosis. IMA: Patent. Inflow: Mild atherosclerosis without stenosis. Veins: Limited opacification.  No suggested acute findings. Review of the MIP images confirms the above findings. NON-VASCULAR Hepatobiliary: No focal hepatic abnormalities or abnormal enhancement. There are small gallstones. No evidence of gallbladder wall thickening or biliary dilatation. Pancreas: Unremarkable. No evidence of pancreatic mass, ductal dilatation or surrounding inflammation. Spleen: Normal in size without focal abnormality. Adrenals/Urinary Tract: Both adrenal glands appear normal. Both kidneys demonstrate mild cortical thinning, but no abnormal enhancement or calculi. There is no hydronephrosis. The bladder appears unremarkable. Stomach/Bowel: No evidence of bowel wall thickening, distention or surrounding inflammation. The appendix appears normal. Mild sigmoid diverticulosis. Lymphatic: There are no enlarged abdominopelvic lymph nodes. Reproductive: The prostate gland and seminal vesicles appear normal. Other: Small left inguinal hernia containing only fat. No ascites.  Postsurgical changes in the anterior abdominal wall. Musculoskeletal: No acute or suspicious osseous findings. There is multilevel thoracolumbar scoliosis. There is a sclerotic lesion laterally in the left 8th rib which is likely an incidental bone island. Review of the MIP images confirms the above findings. IMPRESSION: 1. No acute vascular findings. There is aortic and branch vessel atherosclerosis with mild ostial stenosis of the celiac trunk. No large vessel occlusion, aneurysm or dissection. 2. No evidence of acute pulmonary embolism. 3. Small mediastinal lymph nodes, likely reactive. 4. Cholelithiasis without evidence of cholecystitis. 5. Postsurgical changes as  described. Electronically Signed   By: Richardean Sale M.D.   On: 12/25/2018 17:43   Vas US Carotid (at [facility] Only)  Result Date: 12/27/2018 Carotid Arterial Duplex Study Indications: TIA, Speech disturbance and Weakness. Performing Technologist: Toma Copier RVS  Examination Guidelines: A complete evaluation includes B-mode imaging, spectral Doppler, color Doppler, and power Doppler as needed of all accessible portions of each vessel. Bilateral testing is considered an integral part of a complete examination. Limited examinations for reoccurring indications may be performed as noted.  Right Carotid Findings: +----------+--------+--------+--------+------------+---------------------------+           PSV cm/sEDV cm/sStenosisDescribe    Comments                    +----------+--------+--------+--------+------------+---------------------------+ CCA Prox  79      8                                                       +----------+--------+--------+--------+------------+---------------------------+ CCA Distal89      13                                                      +----------+--------+--------+--------+------------+---------------------------+ ICA Prox  166     15      1-39%   heterogenousmild plaque with acoustic                                                 shadowing                   +----------+--------+--------+--------+------------+---------------------------+ ICA Mid   113     15                                                      +----------+--------+--------+--------+------------+---------------------------+ ICA Distal90      18                                                      +----------+--------+--------+--------+------------+---------------------------+ ECA       255     9  heterogenousmild plaque                 +----------+--------+--------+--------+------------+---------------------------+  +----------+--------+-------+--------+-------------------+           PSV cm/sEDV cmsDescribeArm Pressure (mmHG) +----------+--------+-------+--------+-------------------+ [id number]                                         +----------+--------+-------+--------+-------------------+ +---------+--------+--+--------+-+ VertebralPSV cm/s27EDV cm/s5 +---------+--------+--+--------+-+ Acoustic shadowing in the ICA may obscure higher velocities  Left Carotid Findings: +----------+-------+--------+--------+------------+----------------------------+           PSV    EDV cm/sStenosisDescribe    Comments                               cm/s                                                            +----------+-------+--------+--------+------------+----------------------------+ CCA Prox  67     12                                                       +----------+-------+--------+--------+------------+----------------------------+ CCA Distal102    15              heterogenousmild plaque                  +----------+-------+--------+--------+------------+----------------------------+ ICA Prox  161    28      1-39%   heterogenousplaque with acoustic                                                      shadowing                    +----------+-------+--------+--------+------------+----------------------------+ ICA Mid   132    19                                                       +----------+-------+--------+--------+------------+----------------------------+ ICA Distal107    27                                                       +----------+-------+--------+--------+------------+----------------------------+ ECA       370    5               heterogenousmoderate plaque              +----------+-------+--------+--------+------------+----------------------------+ +----------+--------+--------+--------+-------------------+ SubclavianPSV cm/sEDV  cm/sDescribeArm Pressure (mmHG) +----------+--------+--------+--------+-------------------+           167                                         +----------+--------+--------+--------+-------------------+ +---------+--------+--+--------+-+  VertebralPSV cm/s38EDV cm/s8 +---------+--------+--+--------+-+ Acousticshadowing may obscue higher velocities  Summary: Right Carotid: Velocities in the right ICA are consistent with a 1-39% stenosis.                See technical comments listed above. Left Carotid: Velocities in the left ICA are consistent with a 1-39% stenosis.               See technical comments listed above. Vertebrals:  Bilateral vertebral arteries demonstrate antegrade flow. Subclavians: Normal flow hemodynamics were seen in bilateral subclavian              arteries. *See table(s) above for measurements and observations.  Electronically signed by Antony Contras MD on 12/27/2018 at 12:52:58 PM.    Final     Microbiology: No results found for this or any previous visit (from the past 240 hour(s)).   Labs: Basic Metabolic Panel: Recent Labs  Lab 12/25/18 1441 12/26/18 0558 12/27/18 0402  NA 138 139 137  K 4.0 4.3 4.2  CL 103 105 104  CO2 25 23 23   GLUCOSE 148* 153* 119*  BUN 26* 23 29*  CREATININE 2.04* 1.90* 2.18*  CALCIUM 9.2 8.5* 9.1   Liver Function Tests: Recent Labs  Lab 12/25/18 1441 12/26/18 0558  AST 36 27  ALT 27 22  ALKPHOS 92 76  BILITOT 0.8 0.8  PROT 7.3 6.2*  ALBUMIN 3.8 3.5   No results for input(s): LIPASE, AMYLASE in the last 168 hours. No results for input(s): AMMONIA in the last 168 hours. CBC: Recent Labs  Lab 12/25/18 1441 12/26/18 0558  WBC 8.6 7.2  HGB 13.6 13.0  HCT 40.4 38.3*  MCV 91.2 91.4  PLT 189 176   Cardiac Enzymes: Recent Labs  Lab 12/25/18 1441 12/25/18 1804 12/25/18 2240 12/26/18 0558 12/26/18 1050  CKTOTAL  --   --  304  --   --   CKMB  --   --  6.4*  --   --   TROPONINI 3.05* 3.18* 3.78* 3.24* 3.18*    BNP: BNP (last 3 results) No results for input(s): BNP in the last 8760 hours.  ProBNP (last 3 results) No results for input(s): PROBNP in the last 8760 hours.  CBG: Recent Labs  Lab 12/26/18 1123 12/26/18 1607 12/26/18 2131 12/27/18 0620 12/27/18 1214  GLUCAP 232* 174* 132* 132* 181*       Signed:  Eulogio Bear DO [facility] 12/27/2018, 3:27 PM

## 2018-12-27 NOTE — Progress Notes (Signed)
IV and tele removed without complication. Discharge instructions provided to patient, and pt verbalized understanding of AVS. Pt discharged from facility via wheelchair.

## 2018-12-28 DIAGNOSIS — Z95810 Presence of automatic (implantable) cardiac defibrillator: Secondary | ICD-10-CM | POA: Diagnosis not present

## 2018-12-28 DIAGNOSIS — M1990 Unspecified osteoarthritis, unspecified site: Secondary | ICD-10-CM | POA: Diagnosis not present

## 2018-12-28 DIAGNOSIS — I130 Hypertensive heart and chronic kidney disease with heart failure and stage 1 through stage 4 chronic kidney disease, or unspecified chronic kidney disease: Secondary | ICD-10-CM | POA: Diagnosis not present

## 2018-12-28 DIAGNOSIS — I69354 Hemiplegia and hemiparesis following cerebral infarction affecting left non-dominant side: Secondary | ICD-10-CM | POA: Diagnosis not present

## 2018-12-28 DIAGNOSIS — Z7902 Long term (current) use of antithrombotics/antiplatelets: Secondary | ICD-10-CM | POA: Diagnosis not present

## 2018-12-28 DIAGNOSIS — E785 Hyperlipidemia, unspecified: Secondary | ICD-10-CM | POA: Diagnosis not present

## 2018-12-28 DIAGNOSIS — N184 Chronic kidney disease, stage 4 (severe): Secondary | ICD-10-CM | POA: Diagnosis not present

## 2018-12-28 DIAGNOSIS — Z951 Presence of aortocoronary bypass graft: Secondary | ICD-10-CM | POA: Diagnosis not present

## 2018-12-28 DIAGNOSIS — Z7982 Long term (current) use of aspirin: Secondary | ICD-10-CM | POA: Diagnosis not present

## 2018-12-28 DIAGNOSIS — Z794 Long term (current) use of insulin: Secondary | ICD-10-CM | POA: Diagnosis not present

## 2018-12-28 DIAGNOSIS — I2510 Atherosclerotic heart disease of native coronary artery without angina pectoris: Secondary | ICD-10-CM | POA: Diagnosis not present

## 2018-12-28 DIAGNOSIS — E1122 Type 2 diabetes mellitus with diabetic chronic kidney disease: Secondary | ICD-10-CM | POA: Diagnosis not present

## 2018-12-28 DIAGNOSIS — E1140 Type 2 diabetes mellitus with diabetic neuropathy, unspecified: Secondary | ICD-10-CM | POA: Diagnosis not present

## 2018-12-28 DIAGNOSIS — I509 Heart failure, unspecified: Secondary | ICD-10-CM | POA: Diagnosis not present

## 2018-12-28 DIAGNOSIS — G4730 Sleep apnea, unspecified: Secondary | ICD-10-CM | POA: Diagnosis not present

## 2018-12-29 ENCOUNTER — Encounter: Payer: Self-pay | Admitting: *Deleted

## 2018-12-29 ENCOUNTER — Other Ambulatory Visit: Payer: Self-pay | Admitting: *Deleted

## 2018-12-29 NOTE — Patient Outreach (Signed)
Referral received from hospital liason, pt hospitalized 4/26/-12/27/18 with neurological deficit/ CVA/ Ischemic/ TIA, pt has history CHF, DM 2, CKD stage 4, CAD with ICD, HTN, severe obesity, RN CM placed outreach call to pt for screening, Primary MD office completes transition of care, (pt was previously active with RN health coach), spoke with pt, HIPAA verified, pt also ask that RN CM speak with his wife [name], Pt reports he checks CBG QID and records with reading of 170 today, pt states he does not weigh daily but " can start and will get a scale"  Pt states biggest concern is "not having another stroke", [place] working with pt, pt to follow up with primary MD 01/19/19 and will call neurology today about follow up appointment.  Pt wishes to focus on CVA, CHF and states he will "keep doing what I'm doing for diabetes"    RN CM faxed barrier letter and initial home visit to primary MD office, mailed successful outreach letter to pt with 24 hour nurse line magnet, [facility] calendar.  Outpatient Encounter Medications as of 12/29/2018  Medication Sig Note  . allopurinol (ZYLOPRIM) 300 MG tablet TAKE 1/2 (ONE-HALF) TABLET BY MOUTH ONCE DAILY (Patient taking differently: Take 150 mg by mouth daily. )   . aspirin 81 MG EC tablet Take 1 tablet (81 mg total) by mouth daily for 20 days.   Marland Kitchen atorvastatin (LIPITOR) 80 MG tablet Take 0.5 tablets (40 mg total) by mouth daily.   . cloNIDine (CATAPRES) 0.1 MG tablet TAKE 1 TABLET BY MOUTH TWICE DAILY (Patient taking differently: Take 0.1 mg by mouth 2 (two) times daily. )   . clopidogrel (PLAVIX) 75 MG tablet Take 1 tablet (75 mg total) by mouth daily.   . Cyanocobalamin (VITAMIN B 12 PO) Take 1,000 mcg by mouth daily.   . furosemide (LASIX) 40 MG tablet Take 1 tablet (40 mg total) by mouth 2 (two) times daily.   Marland Kitchen gabapentin (NEURONTIN) 300 MG capsule Take 1 capsule (300 mg total) by mouth 2 (two) times daily.   Marland Kitchen glucose blood (ACCU-CHEK GUIDE) test strip 1  each by Other route 5 (five) times daily. Use as instructed   . glucose monitoring kit (FREESTYLE) monitoring kit 1 each by Does not apply route as needed for other.   . insulin NPH Human (HUMULIN N) 100 UNIT/ML injection INJECT 35 UNITS BID (Patient taking differently: Inject 35 Units into the skin 2 (two) times daily before a meal. )   . Insulin Pen Needle 29G X 12.7MM MISC Use ti inject insulin 5 times a day   . insulin regular (HUMULIN R) 100 units/mL injection Inject 0.3 mLs (30 Units total) into the skin 2 (two) times daily before a meal.   . metoprolol succinate (TOPROL-XL) 50 MG 24 hr tablet Take 1 tablet (50 mg total) by mouth daily.   . nitroGLYCERIN (NITROSTAT) 0.4 MG SL tablet Place 0.4 mg under the tongue every 5 (five) minutes as needed for chest pain. 12/25/2018: Pt has on hand.  . Omega-3 Fatty Acids (FISH OIL) 1000 MG CPDR Take 1 tablet by mouth daily.   . polyethylene glycol powder (GLYCOLAX/MIRALAX) powder Take 17 g by mouth 2 (two) times daily as needed.   . quinapril (ACCUPRIL) 40 MG tablet TAKE 1 TABLET BY MOUTH AT BEDTIME (Patient taking differently: Take 40 mg by mouth daily. )    No facility-administered encounter medications on file as of 12/29/2018.     [facility] CM Care Plan Problem  One     Most Recent Value  Care Plan Problem One  Knowledge deficit related to CVA, CHF  Role Documenting the Problem One  Care Management Coordinator  Care Plan for Problem One  Active  [facility] Long Term Goal   Pt will verbalize/ demonstrate improved self care related to CVA, CHF  THN Long Term Goal Start Date  12/29/18  Interventions for Problem One Long Term Goal  RN CM reviewed plan of care with pt and wife, mailed successful outreach letter to pt home with 24 hour nurse line magnet, pamphlet, [facility] calendar with HF zone, reviewed DC instructions, reviewed medications  [facility] CM Short Term Goal #1   Pt will verbalize signs/ symptoms CVA and ways to prevent in 30 days  [facility] CM Short Term Goal #1 Start  Date  12/29/18  Interventions for Short Term Goal #1  RN CM reviewed signs/ symptoms CVA and actions to take, FAST acronym, reviewed per DC instructions, take ASA and plavix for 3 weeks, then take plavix alone, reviewed importance of good CBG and BP control  [facility] CM Short Term Goal #2   Pt will verbalize CHF zones/ action plan within 30 days  [facility]N CM Short Term Goal #2 Start Date  12/29/18  Interventions for Short Term Goal #2  RN CM mailed pt copy of HF zones and reviewed over the phone today  THN CM Short Term Goal #3  Pt will obtain digital scale and weigh and record in [facility] calendar within 30 days  [facility]N CM Short Term Goal #3 Start Date  12/29/18  Interventions for Short Tern Goal #3  RN CM mailed pt [facility] calendar and ask pt and wife to obtain scale and begin weighing daily and recording and reviewed reasons for doing so.      PLAN Outreach pt next week for follow up Assess CBG, ask if pt obtained scale, assess weight  Jacqlyn Larsen [facility] [place] Coordinator [phone]

## 2018-12-30 LAB — GLUCOSE, CAPILLARY: Glucose-Capillary: 167 mg/dL — ABNORMAL HIGH (ref 70–99)

## 2018-12-31 DIAGNOSIS — Z951 Presence of aortocoronary bypass graft: Secondary | ICD-10-CM | POA: Diagnosis not present

## 2018-12-31 DIAGNOSIS — I2510 Atherosclerotic heart disease of native coronary artery without angina pectoris: Secondary | ICD-10-CM | POA: Diagnosis not present

## 2018-12-31 DIAGNOSIS — I69354 Hemiplegia and hemiparesis following cerebral infarction affecting left non-dominant side: Secondary | ICD-10-CM | POA: Diagnosis not present

## 2018-12-31 DIAGNOSIS — Z794 Long term (current) use of insulin: Secondary | ICD-10-CM | POA: Diagnosis not present

## 2018-12-31 DIAGNOSIS — N184 Chronic kidney disease, stage 4 (severe): Secondary | ICD-10-CM | POA: Diagnosis not present

## 2018-12-31 DIAGNOSIS — E785 Hyperlipidemia, unspecified: Secondary | ICD-10-CM | POA: Diagnosis not present

## 2018-12-31 DIAGNOSIS — E1140 Type 2 diabetes mellitus with diabetic neuropathy, unspecified: Secondary | ICD-10-CM | POA: Diagnosis not present

## 2018-12-31 DIAGNOSIS — Z7982 Long term (current) use of aspirin: Secondary | ICD-10-CM | POA: Diagnosis not present

## 2018-12-31 DIAGNOSIS — Z7902 Long term (current) use of antithrombotics/antiplatelets: Secondary | ICD-10-CM | POA: Diagnosis not present

## 2018-12-31 DIAGNOSIS — M1990 Unspecified osteoarthritis, unspecified site: Secondary | ICD-10-CM | POA: Diagnosis not present

## 2018-12-31 DIAGNOSIS — I130 Hypertensive heart and chronic kidney disease with heart failure and stage 1 through stage 4 chronic kidney disease, or unspecified chronic kidney disease: Secondary | ICD-10-CM | POA: Diagnosis not present

## 2018-12-31 DIAGNOSIS — Z95810 Presence of automatic (implantable) cardiac defibrillator: Secondary | ICD-10-CM | POA: Diagnosis not present

## 2018-12-31 DIAGNOSIS — G4730 Sleep apnea, unspecified: Secondary | ICD-10-CM | POA: Diagnosis not present

## 2018-12-31 DIAGNOSIS — I509 Heart failure, unspecified: Secondary | ICD-10-CM | POA: Diagnosis not present

## 2018-12-31 DIAGNOSIS — E1122 Type 2 diabetes mellitus with diabetic chronic kidney disease: Secondary | ICD-10-CM | POA: Diagnosis not present

## 2019-01-03 DIAGNOSIS — G4730 Sleep apnea, unspecified: Secondary | ICD-10-CM | POA: Diagnosis not present

## 2019-01-03 DIAGNOSIS — I69354 Hemiplegia and hemiparesis following cerebral infarction affecting left non-dominant side: Secondary | ICD-10-CM | POA: Diagnosis not present

## 2019-01-03 DIAGNOSIS — Z95810 Presence of automatic (implantable) cardiac defibrillator: Secondary | ICD-10-CM | POA: Diagnosis not present

## 2019-01-03 DIAGNOSIS — N184 Chronic kidney disease, stage 4 (severe): Secondary | ICD-10-CM | POA: Diagnosis not present

## 2019-01-03 DIAGNOSIS — E785 Hyperlipidemia, unspecified: Secondary | ICD-10-CM | POA: Diagnosis not present

## 2019-01-03 DIAGNOSIS — E1140 Type 2 diabetes mellitus with diabetic neuropathy, unspecified: Secondary | ICD-10-CM | POA: Diagnosis not present

## 2019-01-03 DIAGNOSIS — E1122 Type 2 diabetes mellitus with diabetic chronic kidney disease: Secondary | ICD-10-CM | POA: Diagnosis not present

## 2019-01-03 DIAGNOSIS — Z7982 Long term (current) use of aspirin: Secondary | ICD-10-CM | POA: Diagnosis not present

## 2019-01-03 DIAGNOSIS — I2510 Atherosclerotic heart disease of native coronary artery without angina pectoris: Secondary | ICD-10-CM | POA: Diagnosis not present

## 2019-01-03 DIAGNOSIS — Z794 Long term (current) use of insulin: Secondary | ICD-10-CM | POA: Diagnosis not present

## 2019-01-03 DIAGNOSIS — Z7902 Long term (current) use of antithrombotics/antiplatelets: Secondary | ICD-10-CM | POA: Diagnosis not present

## 2019-01-03 DIAGNOSIS — I130 Hypertensive heart and chronic kidney disease with heart failure and stage 1 through stage 4 chronic kidney disease, or unspecified chronic kidney disease: Secondary | ICD-10-CM | POA: Diagnosis not present

## 2019-01-03 DIAGNOSIS — I509 Heart failure, unspecified: Secondary | ICD-10-CM | POA: Diagnosis not present

## 2019-01-03 DIAGNOSIS — M1990 Unspecified osteoarthritis, unspecified site: Secondary | ICD-10-CM | POA: Diagnosis not present

## 2019-01-03 DIAGNOSIS — Z951 Presence of aortocoronary bypass graft: Secondary | ICD-10-CM | POA: Diagnosis not present

## 2019-01-06 ENCOUNTER — Ambulatory Visit (INDEPENDENT_AMBULATORY_CARE_PROVIDER_SITE_OTHER): Payer: Medicare Other

## 2019-01-06 ENCOUNTER — Other Ambulatory Visit: Payer: Self-pay | Admitting: *Deleted

## 2019-01-06 ENCOUNTER — Other Ambulatory Visit: Payer: Self-pay

## 2019-01-06 DIAGNOSIS — I2510 Atherosclerotic heart disease of native coronary artery without angina pectoris: Secondary | ICD-10-CM | POA: Diagnosis not present

## 2019-01-06 DIAGNOSIS — I69354 Hemiplegia and hemiparesis following cerebral infarction affecting left non-dominant side: Secondary | ICD-10-CM

## 2019-01-06 DIAGNOSIS — I130 Hypertensive heart and chronic kidney disease with heart failure and stage 1 through stage 4 chronic kidney disease, or unspecified chronic kidney disease: Secondary | ICD-10-CM

## 2019-01-06 DIAGNOSIS — E1140 Type 2 diabetes mellitus with diabetic neuropathy, unspecified: Secondary | ICD-10-CM

## 2019-01-06 DIAGNOSIS — M1990 Unspecified osteoarthritis, unspecified site: Secondary | ICD-10-CM

## 2019-01-06 DIAGNOSIS — E785 Hyperlipidemia, unspecified: Secondary | ICD-10-CM

## 2019-01-06 DIAGNOSIS — N184 Chronic kidney disease, stage 4 (severe): Secondary | ICD-10-CM

## 2019-01-06 DIAGNOSIS — E1122 Type 2 diabetes mellitus with diabetic chronic kidney disease: Secondary | ICD-10-CM

## 2019-01-06 DIAGNOSIS — Z951 Presence of aortocoronary bypass graft: Secondary | ICD-10-CM

## 2019-01-06 DIAGNOSIS — I509 Heart failure, unspecified: Secondary | ICD-10-CM

## 2019-01-06 DIAGNOSIS — G4730 Sleep apnea, unspecified: Secondary | ICD-10-CM

## 2019-01-06 NOTE — Patient Outreach (Addendum)
Outreach call for care coordination/ follow up, spoke with pt and wife, HIPAA verified, pt reports family member gave him a digital scale and he has been weighing daily with rate ranging 227-232 pounds, CBG readings fasting range 83-192 and readings after eating range 129-261.  Pt states home health continues to work with him, pt states he has all medications and taking as prescribed, no new concerns voiced.  THN CM Care Plan Problem One     Most Recent Value  Care Plan Problem One  Knowledge deficit related to CVA, CHF  Role Documenting the Problem One  Care Management Coordinator  Care Plan for Problem One  Active  THN Long Term Goal   Pt will verbalize/ demonstrate improved self care related to CVA, CHF  THN Long Term Goal Start Date  12/29/18  Interventions for Problem One Long Term Goal  RN CM reinforced plan of care with pt and wife, pt now has scale and is weighing.  THN CM Short Term Goal #1   Pt will verbalize signs/ symptoms CVA and ways to prevent in 30 days  THN CM Short Term Goal #1 Start Date  12/29/18  Interventions for Short Term Goal #1  RN CM reinforced action plan for signs/ symptoms CVA/ TIA  THN CM Short Term Goal #2   Pt will verbalize CHF zones/ action plan within 30 days  THN CM Short Term Goal #2 Start Date  12/29/18  Interventions for Short Term Goal #2  RN CM reinforced HF action plan  THN CM Short Term Goal #3  Pt will obtain digital scale and weigh and record in [facility] calendar within 30 days  THN CM Short Term Goal #3 Start Date  12/29/18  Interventions for Short Tern Goal #3  Pt has scale and presently weighing daily, will assess again at end of the month      [place] pt later this month for telephonic assessment  Jacqlyn Larsen [facility], [place] Coordinator [phone]

## 2019-01-07 DIAGNOSIS — M1990 Unspecified osteoarthritis, unspecified site: Secondary | ICD-10-CM | POA: Diagnosis not present

## 2019-01-07 DIAGNOSIS — I130 Hypertensive heart and chronic kidney disease with heart failure and stage 1 through stage 4 chronic kidney disease, or unspecified chronic kidney disease: Secondary | ICD-10-CM | POA: Diagnosis not present

## 2019-01-07 DIAGNOSIS — E1122 Type 2 diabetes mellitus with diabetic chronic kidney disease: Secondary | ICD-10-CM | POA: Diagnosis not present

## 2019-01-07 DIAGNOSIS — Z951 Presence of aortocoronary bypass graft: Secondary | ICD-10-CM | POA: Diagnosis not present

## 2019-01-07 DIAGNOSIS — G4730 Sleep apnea, unspecified: Secondary | ICD-10-CM | POA: Diagnosis not present

## 2019-01-07 DIAGNOSIS — E1140 Type 2 diabetes mellitus with diabetic neuropathy, unspecified: Secondary | ICD-10-CM | POA: Diagnosis not present

## 2019-01-07 DIAGNOSIS — Z95810 Presence of automatic (implantable) cardiac defibrillator: Secondary | ICD-10-CM | POA: Diagnosis not present

## 2019-01-07 DIAGNOSIS — Z7902 Long term (current) use of antithrombotics/antiplatelets: Secondary | ICD-10-CM | POA: Diagnosis not present

## 2019-01-07 DIAGNOSIS — I69354 Hemiplegia and hemiparesis following cerebral infarction affecting left non-dominant side: Secondary | ICD-10-CM | POA: Diagnosis not present

## 2019-01-07 DIAGNOSIS — N184 Chronic kidney disease, stage 4 (severe): Secondary | ICD-10-CM | POA: Diagnosis not present

## 2019-01-07 DIAGNOSIS — I2510 Atherosclerotic heart disease of native coronary artery without angina pectoris: Secondary | ICD-10-CM | POA: Diagnosis not present

## 2019-01-07 DIAGNOSIS — E785 Hyperlipidemia, unspecified: Secondary | ICD-10-CM | POA: Diagnosis not present

## 2019-01-07 DIAGNOSIS — Z7982 Long term (current) use of aspirin: Secondary | ICD-10-CM | POA: Diagnosis not present

## 2019-01-07 DIAGNOSIS — I509 Heart failure, unspecified: Secondary | ICD-10-CM | POA: Diagnosis not present

## 2019-01-07 DIAGNOSIS — Z794 Long term (current) use of insulin: Secondary | ICD-10-CM | POA: Diagnosis not present

## 2019-01-10 DIAGNOSIS — Z7982 Long term (current) use of aspirin: Secondary | ICD-10-CM | POA: Diagnosis not present

## 2019-01-10 DIAGNOSIS — E1122 Type 2 diabetes mellitus with diabetic chronic kidney disease: Secondary | ICD-10-CM | POA: Diagnosis not present

## 2019-01-10 DIAGNOSIS — Z7902 Long term (current) use of antithrombotics/antiplatelets: Secondary | ICD-10-CM | POA: Diagnosis not present

## 2019-01-10 DIAGNOSIS — N184 Chronic kidney disease, stage 4 (severe): Secondary | ICD-10-CM | POA: Diagnosis not present

## 2019-01-10 DIAGNOSIS — Z95810 Presence of automatic (implantable) cardiac defibrillator: Secondary | ICD-10-CM | POA: Diagnosis not present

## 2019-01-10 DIAGNOSIS — I509 Heart failure, unspecified: Secondary | ICD-10-CM | POA: Diagnosis not present

## 2019-01-10 DIAGNOSIS — G4730 Sleep apnea, unspecified: Secondary | ICD-10-CM | POA: Diagnosis not present

## 2019-01-10 DIAGNOSIS — E1140 Type 2 diabetes mellitus with diabetic neuropathy, unspecified: Secondary | ICD-10-CM | POA: Diagnosis not present

## 2019-01-10 DIAGNOSIS — Z794 Long term (current) use of insulin: Secondary | ICD-10-CM | POA: Diagnosis not present

## 2019-01-10 DIAGNOSIS — M1990 Unspecified osteoarthritis, unspecified site: Secondary | ICD-10-CM | POA: Diagnosis not present

## 2019-01-10 DIAGNOSIS — E785 Hyperlipidemia, unspecified: Secondary | ICD-10-CM | POA: Diagnosis not present

## 2019-01-10 DIAGNOSIS — Z951 Presence of aortocoronary bypass graft: Secondary | ICD-10-CM | POA: Diagnosis not present

## 2019-01-10 DIAGNOSIS — I2510 Atherosclerotic heart disease of native coronary artery without angina pectoris: Secondary | ICD-10-CM | POA: Diagnosis not present

## 2019-01-10 DIAGNOSIS — I130 Hypertensive heart and chronic kidney disease with heart failure and stage 1 through stage 4 chronic kidney disease, or unspecified chronic kidney disease: Secondary | ICD-10-CM | POA: Diagnosis not present

## 2019-01-10 DIAGNOSIS — I69354 Hemiplegia and hemiparesis following cerebral infarction affecting left non-dominant side: Secondary | ICD-10-CM | POA: Diagnosis not present

## 2019-01-17 ENCOUNTER — Ambulatory Visit: Payer: Medicare Other | Admitting: Adult Health

## 2019-01-18 ENCOUNTER — Other Ambulatory Visit: Payer: Self-pay

## 2019-01-19 ENCOUNTER — Ambulatory Visit (INDEPENDENT_AMBULATORY_CARE_PROVIDER_SITE_OTHER): Payer: Medicare Other | Admitting: Family Medicine

## 2019-01-19 ENCOUNTER — Other Ambulatory Visit: Payer: Self-pay

## 2019-01-19 ENCOUNTER — Encounter: Payer: Self-pay | Admitting: Family Medicine

## 2019-01-19 VITALS — BP 148/70 | HR 63 | Temp 96.7°F | Ht 65.0 in | Wt 233.2 lb

## 2019-01-19 DIAGNOSIS — E1142 Type 2 diabetes mellitus with diabetic polyneuropathy: Secondary | ICD-10-CM

## 2019-01-19 DIAGNOSIS — G629 Polyneuropathy, unspecified: Secondary | ICD-10-CM | POA: Diagnosis not present

## 2019-01-19 DIAGNOSIS — E785 Hyperlipidemia, unspecified: Secondary | ICD-10-CM

## 2019-01-19 DIAGNOSIS — E1165 Type 2 diabetes mellitus with hyperglycemia: Secondary | ICD-10-CM | POA: Diagnosis not present

## 2019-01-19 DIAGNOSIS — Z8739 Personal history of other diseases of the musculoskeletal system and connective tissue: Secondary | ICD-10-CM

## 2019-01-19 DIAGNOSIS — E1140 Type 2 diabetes mellitus with diabetic neuropathy, unspecified: Secondary | ICD-10-CM | POA: Diagnosis not present

## 2019-01-19 DIAGNOSIS — I5022 Chronic systolic (congestive) heart failure: Secondary | ICD-10-CM

## 2019-01-19 DIAGNOSIS — Z8673 Personal history of transient ischemic attack (TIA), and cerebral infarction without residual deficits: Secondary | ICD-10-CM

## 2019-01-19 DIAGNOSIS — N184 Chronic kidney disease, stage 4 (severe): Secondary | ICD-10-CM | POA: Diagnosis not present

## 2019-01-19 DIAGNOSIS — IMO0002 Reserved for concepts with insufficient information to code with codable children: Secondary | ICD-10-CM

## 2019-01-19 HISTORY — DX: Personal history of transient ischemic attack (TIA), and cerebral infarction without residual deficits: Z86.73

## 2019-01-19 LAB — BAYER DCA HB A1C WAIVED: HB A1C (BAYER DCA - WAIVED): 7.5 % — ABNORMAL HIGH (ref <7.0)

## 2019-01-19 MED ORDER — METOPROLOL SUCCINATE ER 50 MG PO TB24: 50.0000 mg | ORAL_TABLET | Freq: Every day | ORAL | 3 refills | Status: DC

## 2019-01-19 MED ORDER — CLONIDINE HCL 0.1 MG PO TABS: 0.1000 mg | ORAL_TABLET | Freq: Two times a day (BID) | ORAL | 3 refills | Status: DC

## 2019-01-19 MED ORDER — FUROSEMIDE 40 MG PO TABS: 40.0000 mg | ORAL_TABLET | Freq: Two times a day (BID) | ORAL | 3 refills | Status: DC

## 2019-01-19 MED ORDER — INSULIN REGULAR HUMAN 100 UNIT/ML IJ SOLN: 30.0000 [IU] | Freq: Two times a day (BID) | INTRAMUSCULAR | 3 refills | Status: DC

## 2019-01-19 MED ORDER — ATORVASTATIN CALCIUM 80 MG PO TABS: 40.0000 mg | ORAL_TABLET | Freq: Every day | ORAL | 5 refills | Status: DC

## 2019-01-19 MED ORDER — GLUCOSE BLOOD VI STRP: 1.0000 | ORAL_STRIP | Freq: Every day | 11 refills | Status: DC

## 2019-01-19 MED ORDER — INSULIN PEN NEEDLE 29G X 12.7MM MISC: 3 refills | Status: DC

## 2019-01-19 MED ORDER — DULOXETINE HCL 30 MG PO CPEP: 30.0000 mg | ORAL_CAPSULE | Freq: Every day | ORAL | 3 refills | Status: DC

## 2019-01-19 MED ORDER — GABAPENTIN 300 MG PO CAPS: 300.0000 mg | ORAL_CAPSULE | Freq: Two times a day (BID) | ORAL | 3 refills | Status: DC

## 2019-01-19 MED ORDER — CLOPIDOGREL BISULFATE 75 MG PO TABS: 75.0000 mg | ORAL_TABLET | Freq: Every day | ORAL | 3 refills | Status: DC

## 2019-01-19 MED ORDER — ALLOPURINOL 300 MG PO TABS: ORAL_TABLET | ORAL | 1 refills | Status: DC

## 2019-01-19 MED ORDER — INSULIN DEGLUDEC 100 UNIT/ML ~~LOC~~ SOPN: 25.0000 [IU] | PEN_INJECTOR | Freq: Every day | SUBCUTANEOUS | 3 refills | Status: DC

## 2019-01-19 MED ORDER — INSULIN NPH (HUMAN) (ISOPHANE) 100 UNIT/ML ~~LOC~~ SUSP: 35.0000 [IU] | Freq: Two times a day (BID) | SUBCUTANEOUS | 3 refills | Status: DC

## 2019-01-19 NOTE — Progress Notes (Signed)
BP (!) 148/70   Pulse 63   Temp (!) 96.7 F (35.9 C) (Oral)   Ht 5' 5"  (1.651 m)   Wt 233 lb 3.2 oz (105.8 kg)   BMI 38.81 kg/m    Subjective:   Patient ID: Manuel Kline, male    DOB: 1931/09/11, 85 y.o.   MRN: [id number]  HPI: Manuel Kline is a 85 y.o. male presenting on 01/19/2019 for Diabetes (3 month follow up) and Hospitalization Follow-up (4/26- [facility]- TIA)   HPI Type 2 diabetes mellitus Patient comes in today for recheck of his diabetes. Patient has been currently taking insulin NPH 35 twice daily, he has been getting both highs and lows through the daytime.  He also uses Humulin R 32 or 3 times daily before meals. Patient is currently on an ACE inhibitor/ARB. Patient has not seen an ophthalmologist this year. Patient denies any new issues with their feet.  Patient has neuropathy but says that it is doing well right now and controlled on the Cymbalta.  Patient also has stage IV CKD and has a nephrologist that is monitoring this.  Hyperlipidemia Patient is coming in for recheck of his hyperlipidemia. The patient is currently taking atorvastatin. They deny any issues with myalgias or history of liver damage from it. They deny any focal numbness or weakness or chest pain.   CHF and hypertension Patient has CHF and hypertension is currently on furosemide and quinapril and metoprolol.  Patient denies any shortness of breath or swelling and has been trying to keep his weights almost daily.  He does have a cardiologist who helps monitor this as well.  Patient has gout and is currently on allopurinol for this, he says he has not had any issues with a gout in quite some time and says that it is working mostly well for him.  He says occasionally he will have an issue and when he does it usually in his ankle or his toes of his feet, mostly right.  Relevant past medical, surgical, family and social history reviewed and updated as indicated. Interim medical history since our last visit reviewed.  Allergies and medications reviewed and updated.  Review of Systems  Constitutional: Negative for chills and fever.  Eyes: Negative for visual disturbance.  Respiratory: Negative for cough, shortness of breath and wheezing.   Cardiovascular: Negative for chest pain, palpitations and leg swelling.  Musculoskeletal: Negative for arthralgias, back pain, gait problem and joint swelling.  Skin: Negative for rash.  Neurological: Negative for dizziness, weakness and light-headedness.  All other systems reviewed and are negative.   Per HPI unless specifically indicated above   Allergies as of 01/19/2019   No Known Allergies     Medication List       Accurate as of Jan 19, 2019 11:47 AM. If you have any questions, ask your nurse or doctor.        allopurinol 300 MG tablet Commonly known as:  ZYLOPRIM TAKE 1/2 (ONE-HALF) TABLET BY MOUTH ONCE DAILY What changed:    how much to take  how to take this  when to take this  additional instructions   atorvastatin 80 MG tablet Commonly known as:  LIPITOR Take 0.5 tablets (40 mg total) by mouth daily.   cloNIDine 0.1 MG tablet Commonly known as:  CATAPRES TAKE 1 TABLET BY MOUTH TWICE DAILY   clopidogrel 75 MG tablet Commonly known as:  PLAVIX Take 1 tablet (75 mg total) by mouth daily.   Fish Oil  1000 MG Cpdr Take 1 tablet by mouth daily.   furosemide 40 MG tablet Commonly known as:  LASIX Take 1 tablet (40 mg total) by mouth 2 (two) times daily.   gabapentin 300 MG capsule Commonly known as:  NEURONTIN Take 1 capsule (300 mg total) by mouth 2 (two) times daily.   glucose blood test strip Commonly known as:  Accu-Chek Guide 1 each by Other route 5 (five) times daily. Use as instructed   glucose monitoring kit monitoring kit 1 each by Does not apply route as needed for other.   insulin NPH Human 100 UNIT/ML injection Commonly known as:  HumuLIN N INJECT 35 UNITS BID What changed:    how much to take  how to  take this  when to take this  additional instructions   Insulin Pen Needle 29G X 12.7MM Misc Use ti inject insulin 5 times a day   insulin regular 100 units/mL injection Commonly known as:  HumuLIN R Inject 0.3 mLs (30 Units total) into the skin 2 (two) times daily before a meal.   metoprolol succinate 50 MG 24 hr tablet Commonly known as:  TOPROL-XL Take 1 tablet (50 mg total) by mouth daily.   nitroGLYCERIN 0.4 MG SL tablet Commonly known as:  NITROSTAT Place 0.4 mg under the tongue every 5 (five) minutes as needed for chest pain.   polyethylene glycol powder 17 GM/SCOOP powder Commonly known as:  GLYCOLAX/MIRALAX Take 17 g by mouth 2 (two) times daily as needed.   quinapril 40 MG tablet Commonly known as:  ACCUPRIL TAKE 1 TABLET BY MOUTH AT BEDTIME What changed:  when to take this   VITAMIN B 12 PO Take 1,000 mcg by mouth daily.        Objective:   BP (!) 148/70   Pulse 63   Temp (!) 96.7 F (35.9 C) (Oral)   Ht 5' 5"  (1.651 m)   Wt 233 lb 3.2 oz (105.8 kg)   BMI 38.81 kg/m   Wt Readings from Last 3 Encounters:  01/19/19 233 lb 3.2 oz (105.8 kg)  12/29/18 235 lb (106.6 kg)  12/25/18 233 lb 14.5 oz (106.1 kg)    Physical Exam Vitals signs and nursing note reviewed.  Constitutional:      General: He is not in acute distress.    Appearance: He is well-developed. He is not diaphoretic.  Eyes:     General: No scleral icterus.    Conjunctiva/sclera: Conjunctivae normal.  Neck:     Musculoskeletal: Neck supple.     Thyroid: No thyromegaly.  Cardiovascular:     Rate and Rhythm: Normal rate and regular rhythm.     Heart sounds: Normal heart sounds. No murmur.  Pulmonary:     Effort: Pulmonary effort is normal. No respiratory distress.     Breath sounds: Normal breath sounds. No wheezing.  Lymphadenopathy:     Cervical: No cervical adenopathy.  Skin:    General: Skin is warm and dry.     Findings: No rash.  Neurological:     Mental Status: He is  alert and oriented to person, place, and time.     Coordination: Coordination normal.  Psychiatric:        Behavior: Behavior normal.       Assessment & Plan:   Problem List Items Addressed This Visit      Cardiovascular and Mediastinum   Chronic systolic heart failure (HCC) (Chronic)   Relevant Medications   atorvastatin (LIPITOR) 80 MG tablet  cloNIDine (CATAPRES) 0.1 MG tablet   furosemide (LASIX) 40 MG tablet   metoprolol succinate (TOPROL-XL) 50 MG 24 hr tablet     Endocrine   Type 2 diabetes, uncontrolled, with neuropathy (HCC) - Primary   Relevant Medications   atorvastatin (LIPITOR) 80 MG tablet   glucose blood (ACCU-CHEK GUIDE) test strip   insulin NPH Human (HUMULIN N) 100 UNIT/ML injection   insulin regular (HUMULIN R) 100 units/mL injection   Insulin Pen Needle 29G X 12.7MM MISC   insulin degludec (TRESIBA FLEXTOUCH) 100 UNIT/ML SOPN FlexTouch Pen   DULoxetine (CYMBALTA) 30 MG capsule   Other Relevant Orders   Bayer DCA Hb A1c Waived (Completed)   CMP14+EGFR (Completed)     Nervous and Auditory   Diabetic peripheral neuropathy (HCC)   Relevant Medications   atorvastatin (LIPITOR) 80 MG tablet   gabapentin (NEURONTIN) 300 MG capsule   insulin NPH Human (HUMULIN N) 100 UNIT/ML injection   insulin regular (HUMULIN R) 100 units/mL injection   insulin degludec (TRESIBA FLEXTOUCH) 100 UNIT/ML SOPN FlexTouch Pen   DULoxetine (CYMBALTA) 30 MG capsule     Genitourinary   CKD (chronic kidney disease) stage 4, GFR 15-29 ml/min (HCC)   Relevant Orders   CBC with Differential/Platelet (Completed)   CMP14+EGFR (Completed)     Other   Hyperlipidemia (Chronic)   Relevant Medications   atorvastatin (LIPITOR) 80 MG tablet   cloNIDine (CATAPRES) 0.1 MG tablet   furosemide (LASIX) 40 MG tablet   metoprolol succinate (TOPROL-XL) 50 MG 24 hr tablet   Other Relevant Orders   Lipid panel (Completed)   History of TIA (transient ischemic attack)   Relevant Medications    clopidogrel (PLAVIX) 75 MG tablet   Other Relevant Orders   CBC with Differential/Platelet (Completed)    Other Visit Diagnoses    H/O: gout       Relevant Medications   allopurinol (ZYLOPRIM) 300 MG tablet   Neuropathy       Relevant Medications   gabapentin (NEURONTIN) 300 MG capsule   DULoxetine (CYMBALTA) 30 MG capsule      Continue allopurinol and gabapentin and Cymbalta and continue atorvastatin and clonidine and metoprolol and Lasix.  Continue daily weight monitoring  We will try and switch the patient from NPH to [place] to see if we can get more stability throughout the day and continue the Humulin R.  This will all be based on affordability and will see if patient can afford it. Follow up plan: Return in about 3 months (around 04/21/2019), or if symptoms worsen or fail to improve, for dm recheck.  Counseling provided for all of the vaccine components No orders of the defined types were placed in this encounter.   Caryl Pina, MD [facility] 01/19/2019, 11:47 AM

## 2019-01-20 LAB — CMP14+EGFR
ALT: 25 IU/L (ref 0–44)
AST: 32 IU/L (ref 0–40)
Albumin/Globulin Ratio: 1.8 (ref 1.2–2.2)
Albumin: 4.8 g/dL — ABNORMAL HIGH (ref 3.6–4.6)
Alkaline Phosphatase: 109 IU/L (ref 39–117)
BUN/Creatinine Ratio: 21 (ref 10–24)
BUN: 43 mg/dL — ABNORMAL HIGH (ref 8–27)
Bilirubin Total: 0.6 mg/dL (ref 0.0–1.2)
CO2: 24 mmol/L (ref 20–29)
Calcium: 9.8 mg/dL (ref 8.6–10.2)
Chloride: 99 mmol/L (ref 96–106)
Creatinine, Ser: 2.03 mg/dL — ABNORMAL HIGH (ref 0.76–1.27)
GFR calc Af Amer: 34 mL/min/{1.73_m2} — ABNORMAL LOW (ref >59)
GFR calc non Af Amer: 29 mL/min/{1.73_m2} — ABNORMAL LOW (ref >59)
Globulin, Total: 2.7 g/dL (ref 1.5–4.5)
Glucose: 70 mg/dL (ref 65–99)
Potassium: 4.5 mmol/L (ref 3.5–5.2)
Sodium: 139 mmol/L (ref 134–144)
Total Protein: 7.5 g/dL (ref 6.0–8.5)

## 2019-01-20 LAB — CBC WITH DIFFERENTIAL/PLATELET
Basophils Absolute: 0 10*3/uL (ref 0.0–0.2)
Basos: 0 %
EOS (ABSOLUTE): 0.2 10*3/uL (ref 0.0–0.4)
Eos: 2 %
Hematocrit: 44 % (ref 37.5–51.0)
Hemoglobin: 15.1 g/dL (ref 13.0–17.7)
Immature Grans (Abs): 0 10*3/uL (ref 0.0–0.1)
Immature Granulocytes: 0 %
Lymphocytes Absolute: 2.8 10*3/uL (ref 0.7–3.1)
Lymphs: 31 %
MCH: 30.9 pg (ref 26.6–33.0)
MCHC: 34.3 g/dL (ref 31.5–35.7)
MCV: 90 fL (ref 79–97)
Monocytes Absolute: 0.6 10*3/uL (ref 0.1–0.9)
Monocytes: 7 %
Neutrophils Absolute: 5.4 10*3/uL (ref 1.4–7.0)
Neutrophils: 60 %
Platelets: 222 10*3/uL (ref 150–450)
RBC: 4.88 x10E6/uL (ref 4.14–5.80)
RDW: 14.2 % (ref 11.6–15.4)
WBC: 9.1 10*3/uL (ref 3.4–10.8)

## 2019-01-20 LAB — LIPID PANEL
Chol/HDL Ratio: 3.4 ratio (ref 0.0–5.0)
Cholesterol, Total: 140 mg/dL (ref 100–199)
HDL: 41 mg/dL (ref >39)
LDL Calculated: 59 mg/dL (ref 0–99)
Triglycerides: 200 mg/dL — ABNORMAL HIGH (ref 0–149)
VLDL Cholesterol Cal: 40 mg/dL (ref 5–40)

## 2019-01-24 ENCOUNTER — Telehealth: Payer: Self-pay | Admitting: Neurology

## 2019-01-24 ENCOUNTER — Ambulatory Visit (INDEPENDENT_AMBULATORY_CARE_PROVIDER_SITE_OTHER): Payer: Medicare Other | Admitting: *Deleted

## 2019-01-24 DIAGNOSIS — I472 Ventricular tachycardia: Secondary | ICD-10-CM

## 2019-01-24 DIAGNOSIS — I255 Ischemic cardiomyopathy: Secondary | ICD-10-CM | POA: Diagnosis not present

## 2019-01-24 DIAGNOSIS — I4729 Other ventricular tachycardia: Secondary | ICD-10-CM

## 2019-01-24 LAB — CUP PACEART REMOTE DEVICE CHECK
Battery Remaining Longevity: 81 mo
Battery Voltage: 3 V
Brady Statistic AP VP Percent: 0.12 %
Brady Statistic AP VS Percent: 91.89 %
Brady Statistic AS VP Percent: 0.01 %
Brady Statistic AS VS Percent: 7.98 %
Brady Statistic RA Percent Paced: 91.97 %
Brady Statistic RV Percent Paced: 0.11 %
Date Time Interrogation Session: 20200526133626
HighPow Impedance: 51 Ohm
HighPow Impedance: 68 Ohm
Implantable Lead Implant Date: 20030519
Implantable Lead Implant Date: 20030519
Implantable Lead Location: 753859
Implantable Lead Location: 753860
Implantable Lead Model: 158
Implantable Lead Model: 4087
Implantable Lead Serial Number: 115102
Implantable Lead Serial Number: 159999
Implantable Pulse Generator Implant Date: 20171114
Lead Channel Impedance Value: 4047 Ohm
Lead Channel Impedance Value: 4047 Ohm
Lead Channel Impedance Value: 4047 Ohm
Lead Channel Impedance Value: 437 Ohm
Lead Channel Impedance Value: 494 Ohm
Lead Channel Impedance Value: 494 Ohm
Lead Channel Pacing Threshold Amplitude: 0.875 V
Lead Channel Pacing Threshold Amplitude: 1.25 V
Lead Channel Pacing Threshold Pulse Width: 0.4 ms
Lead Channel Pacing Threshold Pulse Width: 0.4 ms
Lead Channel Sensing Intrinsic Amplitude: 14.25 mV
Lead Channel Sensing Intrinsic Amplitude: 14.25 mV
Lead Channel Sensing Intrinsic Amplitude: 2.75 mV
Lead Channel Sensing Intrinsic Amplitude: 2.75 mV
Lead Channel Setting Pacing Amplitude: 2.5 V
Lead Channel Setting Pacing Amplitude: 2.5 V
Lead Channel Setting Pacing Pulse Width: 0.4 ms
Lead Channel Setting Sensing Sensitivity: 0.3 mV

## 2019-01-24 NOTE — Telephone Encounter (Signed)
Due to current COVID 19 pandemic, our office is severely reducing in office visits until further notice, in order to minimize the risk to our patients and healthcare providers.   Called patient and spoke with wife and family member Sonia Baller and confirmed a virtual visit for his 6/2 appointment. They verbalized understanding of the [email or web address] process and I have sent an e-mail to [email or web address] with link and directions as well as my name and office number/hours for reference. Patient understands that he will receive a call from RN to update chart.  Pt understands that although there may be some limitations with this type of visit, we will take all precautions to reduce any security or privacy concerns.  Pt understands that this will be treated like an in office visit and we will file with pt's insurance, and there may be a patient responsible charge related to this service.

## 2019-01-26 ENCOUNTER — Encounter: Payer: Self-pay | Admitting: *Deleted

## 2019-01-26 ENCOUNTER — Telehealth: Payer: Self-pay | Admitting: Family Medicine

## 2019-01-26 ENCOUNTER — Telehealth: Payer: Self-pay | Admitting: Neurology

## 2019-01-26 ENCOUNTER — Other Ambulatory Visit: Payer: Self-pay | Admitting: *Deleted

## 2019-01-26 DIAGNOSIS — IMO0002 Reserved for concepts with insufficient information to code with codable children: Secondary | ICD-10-CM

## 2019-01-26 DIAGNOSIS — E1140 Type 2 diabetes mellitus with diabetic neuropathy, unspecified: Secondary | ICD-10-CM

## 2019-01-26 DIAGNOSIS — E119 Type 2 diabetes mellitus without complications: Secondary | ICD-10-CM

## 2019-01-26 MED ORDER — INSULIN NPH (HUMAN) (ISOPHANE) 100 UNIT/ML ~~LOC~~ SUSP: 35.0000 [IU] | Freq: Two times a day (BID) | SUBCUTANEOUS | 3 refills | Status: DC

## 2019-01-26 NOTE — Telephone Encounter (Signed)
Left message.  Can go back on humulin.  Call to confirm message received.

## 2019-01-26 NOTE — Telephone Encounter (Signed)
Spoke with patient and he would prefer to see Dr. Leonie Man for his hospital follow up since he saw him in the hospital. We rescheduled his doxy visit with Dr. Leonie Man instead. I also called their daughter in law Sonia Baller to make sure that day and time worked for her. She agreed, and another email was sent to her at [email or web address] with Dr. Clydene Fake doxy link, appointment information, and instructions. She is aware she'll be receiving a new email.  Pt had previously given consent for a virtual visit.

## 2019-01-26 NOTE — Telephone Encounter (Signed)
If Patient could not afford tresiba, have him go back on [place], MD [place] 01/26/2019, 12:04 PM

## 2019-01-26 NOTE — Patient Outreach (Signed)
Outreach call to pt for telephone assessment, spoke with pt and wife, HIPAA verified, pt reports he saw primary MD on 01/19/19 and NPH discontinued and now on tresiba and states " but I don't think I'm gonna be able to afford this"  Wife reports most recent FBS 180, 161, 182, 185 and recent RBS 117, 135, 210.  Pt agreeable to assistance of [facility] pharmacist for affordability/ resources.  Weight today 223 pounds and pt states he has lost some weight and would like to "lose a little more" . Pt states home health discharged, neurology visit on 01/31/19 will be " over the phone" per pt.  RN CM placed order for [facility] pharmacist.  [facility] CM Care Plan Problem One     Most Recent Value  Care Plan Problem One  Knowledge deficit related to CVA, CHF  Role Documenting the Problem One  Care Management Coordinator  Care Plan for Problem One  Active  THN Long Term Goal   Pt will verbalize/ demonstrate improved self care related to CVA, CHF  THN Long Term Goal Start Date  12/29/18  Interventions for Problem One Long Term Goal  RN CM reviewed plan of care with pt, reviewed AVS from visit with primary care provider in May, placed order for [facility] pharmcacist for assistance with affordability tresiba (added at last MD visit)  [facility] CM Short Term Goal #1   Pt will verbalize signs/ symptoms CVA and ways to prevent in 30 days  THN CM Short Term Goal #1 Start Date  01/19/19 [goal re-establishsed]  Interventions for Short Term Goal #1  RN CM reviewed signs/ symptoms CVA and reportable signs/ symptoms  THN CM Short Term Goal #2   Pt will verbalize CHF zones/ action plan within 30 days  THN CM Short Term Goal #2 Start Date  01/26/19 [goal re-established]  Interventions for Short Term Goal #2  RN CM reviewed HF action plan with emphasis on yellow zone  THN CM Short Term Goal #3  Pt will obtain digital scale and weigh and record in [facility] calendar within 30 days  THN CM Short Term Goal #3 Start Date  12/29/18  [facility] CM Short Term Goal #3 Met Date   01/26/19 [pt is weighing daily and recording]  Interventions for Short Tern Goal #3  Reviewed most recent weights with pt and wife, pt is recording daily     PLAN Outreach pt next month for telephone assessment Collaborate with [facility] pharmacist as needed Assess weight, CBG  Jacqlyn Larsen [facility], [place] Coordinator [phone]

## 2019-01-27 NOTE — Telephone Encounter (Signed)
lmtcb

## 2019-01-31 ENCOUNTER — Other Ambulatory Visit: Payer: Self-pay

## 2019-01-31 ENCOUNTER — Telehealth: Payer: Self-pay | Admitting: Neurology

## 2019-01-31 ENCOUNTER — Ambulatory Visit: Payer: Self-pay | Admitting: Pharmacist

## 2019-01-31 ENCOUNTER — Institutional Professional Consult (permissible substitution): Payer: Medicare Other | Admitting: Neurology

## 2019-01-31 ENCOUNTER — Ambulatory Visit (INDEPENDENT_AMBULATORY_CARE_PROVIDER_SITE_OTHER): Payer: Medicare Other | Admitting: Neurology

## 2019-01-31 ENCOUNTER — Encounter: Payer: Self-pay | Admitting: Neurology

## 2019-01-31 ENCOUNTER — Other Ambulatory Visit: Payer: Self-pay | Admitting: Pharmacist

## 2019-01-31 DIAGNOSIS — I6300 Cerebral infarction due to thrombosis of unspecified precerebral artery: Secondary | ICD-10-CM | POA: Diagnosis not present

## 2019-01-31 DIAGNOSIS — I6381 Other cerebral infarction due to occlusion or stenosis of small artery: Secondary | ICD-10-CM | POA: Diagnosis not present

## 2019-01-31 NOTE — Patient Instructions (Signed)
I had a long d/w patient about his recent stroke, risk for recurrent stroke/TIAs, personally independently reviewed imaging studies and stroke evaluation results and answered questions.Continue Plavix 75 mg daily for secondary stroke prevention and maintain strict control of hypertension with blood pressure goal below 130/90, diabetes with hemoglobin A1c goal below 6.5% and lipids with LDL cholesterol goal below 70 mg/dL. I also advised the patient to eat a healthy diet with plenty of whole grains, cereals, fruits and vegetables, exercise regularly and maintain ideal body weight. Check repeat CT head wo for stroke and transcranial doppler study. Start Cymbalta for diabetic neuropathic pain.Followup in the future with my nurse practitioner Janett Billow in 3 months or call earlier if necessary

## 2019-01-31 NOTE — Telephone Encounter (Signed)
[employer] medicare order sent to GI. No auth they will reach out to the patient to schedule.  

## 2019-01-31 NOTE — Progress Notes (Signed)
Virtual Visit via Video Note  I connected with Manuel Kline on 01/31/19 at  9:30 AM EDT by a video enabled telemedicine application and verified that I am speaking with the correct person using two identifiers.  Location: Patient: at home with daughter Provider: at [facility] office   I discussed the limitations of evaluation and management by telemedicine and the availability of in person appointments. The patient expressed understanding and agreed to proceed.  This meeting was performed using [email or web address] app for audio and video.  The patient's daughter was present throughout this visit and facilitated it.  History of Present Illness: [name] is a pleasant 85 year old Caucasian male seen today for initial virtual video office follow-up visit for stroke.  History is obtained from the patient, his daughter and review of electronic medical records.  I have personally reviewed imaging films in PACS.  He has past medical history of coronary artery disease, cardiomyopathy, diabetes, hypertension, hyperlipidemia, obesity, sleep apnea, implantable defibrillator, CABG and metabolic syndrome.  On 12/25/2018 he developed sudden onset of disorientation, slurred speech and left facial droop and left-sided weakness while at his drive in church service.  He was brought to [facility] but his symptoms had resolved except for some left hand numbness.  Stat noncontrast CT scan of the head was obtained which showed no acute abnormality only changes of age-related small vessel disease and mild atrophy.  CT angiogram could not be performed due to patient's creatinine being elevated.  MRI could not be performed due to defibrillator.  Transthoracic echo was unremarkable.  LDL cholesterol was 35 mg percent and hemoglobin A1c was elevated at 7.3.  Patient previously was on aspirin and Plavix was added for 3 weeks and now is currently on Plavix alone which is tolerating well without bruising or bleeding.  He states his has not had  any recurrent episodes but he still has residual numbness in his left hand.  He denies any significant weakness on the left side.  He is tolerating Lipitor 40 mg daily without muscle aches and pains.  He states his sugars continue to fluctuate and this morning his fasting sugar was 250 rest yesterday it was on the lower side.  He is working with his primary care physician towards better sugar control and is thinking about getting a referral to endocrinologist.  His blood pressure is well controlled.  He has no new complaints today.  He does have mild diabetic neuropathy and has paresthesias in his feet.  He recently got a prescription for Cymbalta 30 mg daily but he has not started it yet because he wants to discuss it with his nephrologist. Observations/Objective: Physical and neurological exam is limited due to constraints from virtual video visit.  Pleasant elderly obese Caucasian male not in distress.  Is alert awake oriented to time place and person.  His speech and language appear normal.  Extraocular movements are full range without nystagmus.  Face is symmetric but when he smiles there is slight left nasolabial fold asymmetry.  He has slight decreased hearing bilaterally.  Tongue is midline.  Motor system exam reveals symmetric upper and lower extremity strength.  He has slight diminished fine finger movements on the left and orbits right over left upper extremity.  He has mild subjective diminished sensation in the left hand.  He is able to walk with a cautious but fairly steady gait.  He can stand on either foot with holding onto the chair.  He has weakness while trying to stand  on his heels suggestive distal dorsiflexor weakness likely from his diabetic neuropathy.  Assessment  : 85 year old Caucasian male with episode of sudden onset of confusion with left-sided weakness and April 2020 likely right brain subcortical infarct not visualized on CT scan.  MRI could not be obtained due to defibrillator  and CT angios could not be done due to renal insufficiency.  He has made good recovery except for some residual left hand numbness.  Multiple vascular risk factors of diabetes, hypertension, hyperlipidemia, obesity, sleep apnea, coronary artery disease PLAN :  I had a long d/w patient about his recent stroke, risk for recurrent stroke/TIAs, personally independently reviewed imaging studies and stroke evaluation results and answered questions.Continue Plavix 75 mg daily for secondary stroke prevention and maintain strict control of hypertension with blood pressure goal below 130/90, diabetes with hemoglobin A1c goal below 6.5% and lipids with LDL cholesterol goal below 70 mg/dL. I also advised the patient to eat a healthy diet with plenty of whole grains, cereals, fruits and vegetables, exercise regularly and maintain ideal body weight. Follow Up Instructions:  Check repeat CT head wo for stroke and transcranial doppler study.  Start Cymbalta for diabetic neuropathic pain. Followup in the future with my nurse practitioner Janett Billow in 3 months or call earlier if necessary    I discussed the assessment and treatment plan with the patient. The patient was provided an opportunity to ask questions and all were answered. The patient agreed with the plan and demonstrated an understanding of the instructions.   The patient was advised to call back or seek an in-person evaluation if the symptoms worsen or if the condition fails to improve as anticipated.  I provided 25 minutes of non-face-to-face time during this encounter.   Antony Contras, MD

## 2019-01-31 NOTE — Patient Outreach (Signed)
[facility]) Care Management  [place]   01/31/2019  Manuel Kline 1931-11-04 [id number]  Reason for referral: Medication Assistance  Referral source: [facility] RN Current insurance: [facility]  PMHx includes but not limited to:  DMT2, CAD, HTN, HLD  Outreach:  Successful telephone call with Mr. & Manuel Kline.  HIPAA identifiers verified. Patient agreeable to review medications telephonically.  He states his insulins have recently changed due to cost.  He reports compliance with medications.  Last A1c was 7.5% on 01/19/19.  Message sent to Dr. Warrick Parisian to clarify insulin plan.  The plan will be to transition Manuel Kline to once daily Basaglar with Humulin R or Humalog for meal coverage (twice a day) for now per PCP.  This new regimen will provide ease of once daily basal dosing with the potential to wean off meal time coverage.  Patient denies hypoglycemia and checks BGs daily.  Patient agreeable to participate in patient assistance programs.  [name] will accept patient with partial extra help through government.  Lab Results  Component Value Date   CREATININE 2.03 (H) 01/19/2019   CREATININE 2.18 (H) 12/27/2018   CREATININE 1.90 (H) 12/26/2018    Lab Results  Component Value Date   HGBA1C 7.5 (H) 01/19/2019    Lipid Panel     Component Value Date/Time   CHOL 140 01/19/2019 1214   TRIG 200 (H) 01/19/2019 1214   HDL 41 01/19/2019 1214   CHOLHDL 3.4 01/19/2019 1214   CHOLHDL 3.4 12/26/2018 0558   VLDL 36 12/26/2018 0558   LDLCALC 59 01/19/2019 1214    BP Readings from Last 3 Encounters:  01/19/19 (!) 148/70  12/27/18 (!) 155/70  10/20/18 138/83   Medications Reviewed Today    Reviewed by Garvin Fila, MD (Physician) on 01/31/19 at [phone]  Med List Status: <None>  Medication Order Taking? Sig Documenting Provider Last Dose Status Informant  allopurinol (ZYLOPRIM) 300 MG tablet [id number]  TAKE 1/2 (ONE-HALF) TABLET BY MOUTH ONCE DAILY  Dettinger, Fransisca Kaufmann, MD  Active   atorvastatin (LIPITOR) 80 MG tablet [id number]  Take 0.5 tablets (40 mg total) by mouth daily. Dettinger, Fransisca Kaufmann, MD  Active   cloNIDine (CATAPRES) 0.1 MG tablet [id number]  Take 1 tablet (0.1 mg total) by mouth 2 (two) times daily. Dettinger, Fransisca Kaufmann, MD  Active   clopidogrel (PLAVIX) 75 MG tablet [id number]  Take 1 tablet (75 mg total) by mouth daily. Dettinger, Fransisca Kaufmann, MD  Active   Cyanocobalamin (VITAMIN B 12 PO) [id number] No Take 1,000 mcg by mouth daily. [provider] Taking Active Spouse/Significant Other  DULoxetine (CYMBALTA) 30 MG capsule [id number]  Take 1 capsule (30 mg total) by mouth daily. Dettinger, Fransisca Kaufmann, MD  Active   furosemide (LASIX) 40 MG tablet [id number]  Take 1 tablet (40 mg total) by mouth 2 (two) times daily. Dettinger, Fransisca Kaufmann, MD  Active   gabapentin (NEURONTIN) 300 MG capsule [id number]  Take 1 capsule (300 mg total) by mouth 2 (two) times daily. Dettinger, Fransisca Kaufmann, MD  Active   glucose blood (ACCU-CHEK GUIDE) test strip [id number]  1 each by Other route 5 (five) times daily. Use as instructed Dettinger, Fransisca Kaufmann, MD  Active   glucose monitoring kit (FREESTYLE) monitoring kit [id number] No 1 each by Does not apply route as needed for other. Dettinger, Fransisca Kaufmann, MD Taking Active Spouse/Significant Other  insulin NPH Human (HUMULIN N) 100 UNIT/ML injection [id number]  Inject 0.35 mLs (35 Units  total) into the skin 2 (two) times daily before a meal. Dettinger, Fransisca Kaufmann, MD  Active   Insulin Pen Needle 29G X 12.7MM MISC [id number]  Use ti inject insulin 5 times a day Dettinger, Fransisca Kaufmann, MD  Active   insulin regular (HUMULIN R) 100 units/mL injection [id number]  Inject 0.3 mLs (30 Units total) into the skin 2 (two) times daily before a meal. Dettinger, Fransisca Kaufmann, MD  Active   metoprolol succinate (TOPROL-XL) 50 MG 24 hr tablet [id number]  Take 1 tablet (50 mg total) by mouth daily. Dettinger, Fransisca Kaufmann, MD  Active   nitroGLYCERIN  (NITROSTAT) 0.4 MG SL tablet [id number] No Place 0.4 mg under the tongue every 5 (five) minutes as needed for chest pain. [provider] Taking Active Spouse/Significant Other           Med Note Leanord Asal, TREVINA M   Sun Dec 25, 2018  3:22 PM) Pt has on hand.  Omega-3 Fatty Acids (FISH OIL) 1000 MG CPDR [id number] No Take 1 tablet by mouth daily. [provider] Taking Active Spouse/Significant Other  polyethylene glycol powder (GLYCOLAX/MIRALAX) powder [id number] No Take 17 g by mouth 2 (two) times daily as needed. Eustaquio Maize, MD Taking Active Spouse/Significant Other  quinapril (ACCUPRIL) 40 MG tablet [id number] No TAKE 1 TABLET BY MOUTH AT BEDTIME  Patient taking differently:  Take 40 mg by mouth daily.    Park Liter, MD Taking Active Spouse/Significant Other          Medication Assistance Findings:   Patient Assistance Programs: Basaglar & Humulin R made by [place] requirement met: Yes o Out-of-pocket prescription expenditure met:   Not Applicable - Patient has met application requirements to apply for this patient assistance program.     Plan: . I will route patient assistance letter to [place] technician who will coordinate patient assistance program application process for medications listed above.  [facility] pharmacy technician will assist with obtaining all required documents from both patient and provider(s) and submit application(s) once completed.   . Will discuss insulin plan with PCP and family.   Regina Eck, PharmD, [name]  [phone]

## 2019-02-01 ENCOUNTER — Other Ambulatory Visit: Payer: Self-pay | Admitting: Pharmacy Technician

## 2019-02-01 ENCOUNTER — Other Ambulatory Visit: Payer: Self-pay | Admitting: Pharmacist

## 2019-02-01 NOTE — Patient Outreach (Signed)
[place] [facility]) Care Management  02/01/2019  Manuel Kline [birth year] [id number]                          Medication Assistance Referral  Referral From: [facility] RPh Jenne Pane.  Medication/Company: Tyler Aas and Ardis Hughs / Novo Nordisk Patient application portion:  Education officer, museum portion: Faxed  to Dr. Warrick Parisian   Follow up:  Will follow up with patient in 7-10 business days to confirm application(s) have been received.  [name] Chana Bode [place] Certified Pharmacy Technician [place] Management Direct [phone]

## 2019-02-01 NOTE — Patient Outreach (Signed)
[facility]) Care Management [place]  02/01/2019  ODAI WIMMER 11-08-1931 [id number]  Reason for referral: medication assistance  Incoming call from [place], Etter Sjogren stating that NovoNordisk does not accept patients that have partial LIS.  Will route message to PCP regarding application for Basaglar as [employer] will accept patients with LIS.  PLAN: -Informed [facility] CPhT, Etter Sjogren of updates -Called patient to confirm  Regina Eck, PharmD, [place]  [phone]

## 2019-02-02 ENCOUNTER — Other Ambulatory Visit: Payer: Self-pay | Admitting: Pharmacy Technician

## 2019-02-02 NOTE — Patient Outreach (Signed)
[facility]) Care Management  02/02/2019  ZAEDYN COVIN Jan 12, 1932 [id number]   Per [facility] RPh Lottie Dawson patient medications for patient assistance are being switched to Basaglar and Humulin R thru [employer] due to program allowing patient to have partial LIS. Will prepare new application to be mailed out to patient.  Will follow up with patient in 5-7 business days to confirm new application has been received.  [name] Chana Bode [place] Certified Pharmacy Technician [place] Management Direct Dial:[phone]

## 2019-02-03 ENCOUNTER — Ambulatory Visit: Payer: Self-pay | Admitting: *Deleted

## 2019-02-03 MED ORDER — BASAGLAR KWIKPEN 100 UNIT/ML ~~LOC~~ SOPN: 25.0000 [IU] | PEN_INJECTOR | Freq: Every day | SUBCUTANEOUS | Status: DC

## 2019-02-03 NOTE — Addendum Note (Signed)
Addended by: Ilean China on: 02/03/2019 09:59 AM   Modules accepted: Orders

## 2019-02-03 NOTE — Chronic Care Management (AMB) (Signed)
   Care Management   General Note (Not CCM) 02/03/2019 Name: Manuel Kline MRN: [id number] DOB: 09-May-1932  [name] has been working with [place] to obtain prescription assistance. See prior notes from [facility] for more information. We received a fax today for Dr Dettinger to sign. Patient switched to [place] from [place] because of available assistance through [employer].   Consulted with Dr Dettinger regarding Basaglar dosage and updated medical record to reflect current prescriptions. Will fax completed forms to Etter Sjogren in [place] Management at [phone].   Chong Sicilian, RN-BC, BSN Nurse Care Manager [facility] [phone]

## 2019-02-06 NOTE — Progress Notes (Signed)
Remote ICD transmission.   

## 2019-02-08 ENCOUNTER — Ambulatory Visit (HOSPITAL_COMMUNITY)
Admission: RE | Admit: 2019-02-08 | Discharge: 2019-02-08 | Disposition: A | Discharge status: Home / Self Care | Payer: Medicare Other | Source: Ambulatory Visit | Attending: Neurology | Admitting: Neurology

## 2019-02-08 ENCOUNTER — Other Ambulatory Visit: Payer: Self-pay

## 2019-02-08 DIAGNOSIS — I6300 Cerebral infarction due to thrombosis of unspecified precerebral artery: Secondary | ICD-10-CM

## 2019-02-13 ENCOUNTER — Other Ambulatory Visit: Payer: Self-pay | Admitting: Pharmacy Technician

## 2019-02-13 NOTE — Patient Outreach (Signed)
[facility]) Care Management  02/13/2019  Manuel Kline 09-Jul-1932 [id number]   Received patient portion(s) of patient assistance application for Basaglar and Humulin R. Faxed completed application and required documents into [employer].  Will follow up with company in 5-7 business days to check status of application.  [name] Chana Bode [place] Certified Pharmacy Technician [place] Management Direct Dial:[phone]

## 2019-02-15 ENCOUNTER — Telehealth: Payer: Self-pay

## 2019-02-15 ENCOUNTER — Inpatient Hospital Stay: Payer: Medicare Other | Admitting: Adult Health

## 2019-02-15 NOTE — Telephone Encounter (Signed)
Patient aware on how to take the insulin - patient aware and verbalizes understanding.

## 2019-02-16 ENCOUNTER — Telehealth: Payer: Self-pay | Admitting: Family Medicine

## 2019-02-16 ENCOUNTER — Other Ambulatory Visit: Payer: Self-pay | Admitting: Pharmacist

## 2019-02-16 ENCOUNTER — Ambulatory Visit: Payer: Self-pay | Admitting: Pharmacist

## 2019-02-16 DIAGNOSIS — N184 Chronic kidney disease, stage 4 (severe): Secondary | ICD-10-CM | POA: Diagnosis not present

## 2019-02-16 NOTE — Patient Outreach (Signed)
[facility]) [place]  02/16/2019  MAURICO PERRELL [birth year] [id number]   Reason for referral: Medication Assistance, Medication Management  Referral source: [facility] RN Current insurance: [facility]  Reason for call: insulin dosing  Outreach:  Unsuccessful telephone call attempt to patient.   HIPAA compliant voicemail left requesting a return call  Plan:  -I will make another outreach attempt to patient within 3-4 business days.    Regina Eck, PharmD, [place]  [phone]

## 2019-02-20 ENCOUNTER — Ambulatory Visit: Payer: Self-pay | Admitting: Pharmacist

## 2019-02-20 ENCOUNTER — Other Ambulatory Visit: Payer: Self-pay | Admitting: Pharmacy Technician

## 2019-02-20 NOTE — Patient Outreach (Addendum)
[facility]) Care Management  02/20/2019  JAYLON BOYLEN Jan 03, 1932 [id number]   Follow up call placed to [facility] regarding patient assistance application(s) for Basaglar and Humulin R , Sheryle Spray confirms patient has been approved as of 6/19 until 08/31/19. Faxed script portions of application into [facility] pharmacy.  Follow up:  Will follow up with [facility] in 3-5 business days to ship shipment status.  [name] Chana Bode [place] Certified Pharmacy Technician [place] Management Direct Dial:[phone]

## 2019-02-20 NOTE — Telephone Encounter (Signed)
Faxed last labs

## 2019-02-21 ENCOUNTER — Ambulatory Visit
Admission: RE | Admit: 2019-02-21 | Discharge: 2019-02-21 | Disposition: A | Discharge status: Home / Self Care | Payer: Medicare Other | Source: Ambulatory Visit | Attending: Neurology | Admitting: Neurology

## 2019-02-21 ENCOUNTER — Ambulatory Visit: Payer: Medicare Other

## 2019-02-21 DIAGNOSIS — I6300 Cerebral infarction due to thrombosis of unspecified precerebral artery: Secondary | ICD-10-CM | POA: Diagnosis not present

## 2019-02-22 ENCOUNTER — Encounter: Payer: Self-pay | Admitting: *Deleted

## 2019-02-22 ENCOUNTER — Other Ambulatory Visit: Payer: Self-pay | Admitting: *Deleted

## 2019-02-22 ENCOUNTER — Encounter: Payer: Self-pay | Admitting: Neurology

## 2019-02-22 DIAGNOSIS — H3562 Retinal hemorrhage, left eye: Secondary | ICD-10-CM | POA: Diagnosis not present

## 2019-02-22 DIAGNOSIS — H3561 Retinal hemorrhage, right eye: Secondary | ICD-10-CM | POA: Diagnosis not present

## 2019-02-22 DIAGNOSIS — H35041 Retinal micro-aneurysms, unspecified, right eye: Secondary | ICD-10-CM | POA: Diagnosis not present

## 2019-02-22 DIAGNOSIS — E113393 Type 2 diabetes mellitus with moderate nonproliferative diabetic retinopathy without macular edema, bilateral: Secondary | ICD-10-CM | POA: Diagnosis not present

## 2019-02-22 LAB — HM DIABETES EYE EXAM

## 2019-02-22 NOTE — Patient Outreach (Signed)
Outreach call to pt and wife for telephone assessment, spoke with pt and wife, HIPAA verified, patient's wife states she is going to provide more oversight with pt checking CBG as he is not checking everyday as he should but sometimes skipping a day,  CBG has mostly been in 100's range with occasional reading over 200, wife states pt eats too much bread and large portion sizes. Pt has also gained weight since last month due to increase in food intake per wife.  Weight today 235 pounds.  Pt did have telephone visit with neurologist on 01/31/19, had eye exam today.  [facility] pharmacist continues to assist pt.   THN CM Care Plan Problem One     Most Recent Value  Care Plan Problem One  Knowledge deficit related to CVA, CHF  Role Documenting the Problem One  Care Management Coordinator  Care Plan for Problem One  Active  THN Long Term Goal   Pt will verbalize/ demonstrate improved self care related to CVA, CHF  THN Long Term Goal Start Date  12/29/18  Interventions for Problem One Long Term Goal  RN CM reinforced plan of care,  [facility] pharmacist now working with pt, wife reports pt has all medications and taking as prescribed, pt did have visit with neurologist on 01/31/19 and had CT scan (has not gotten results yet)  THN CM Short Term Goal #1   Pt will verbalize signs/ symptoms CVA and ways to prevent in 30 days  THN CM Short Term Goal #1 Start Date  01/19/19 [goal re-establishsed]  THN CM Short Term Goal #1 Met Date  02/22/19  Interventions for Short Term Goal #1  Pt, wife state there have no signs/ symptoms of TIA/ CVA and verbalize understanding of symptoms/ change in health status, action plan  THN CM Short Term Goal #2   Pt will verbalize CHF zones/ action plan within 30 days  THN CM Short Term Goal #2 Start Date  02/22/19 Barrie Folk re-established]  Interventions for Short Term Goal #2  RN CM reinforced HF action plan, pt reports in green zone today  THN CM Short Term Goal #3  Pt will obtain digital scale and  weigh and record in [facility] calendar within 30 days  THN CM Short Term Goal #3 Start Date  12/29/18  [facility] CM Short Term Goal #3 Met Date  01/26/19 [pt is weighing daily and recording]    [facility] CM Care Plan Problem Two     Most Recent Value  Care Plan Problem Two  Knowledge deficit related to diabetes  Role Documenting the Problem Two  Care Management Coordinator  THN CM Short Term Goal #1   Pt will decrease carbohydrate intake and portion sizes within 30 days  THN CM Short Term Goal #1 Start Date  02/22/19  Interventions for Short Term Goal #2   RN CM ask pt, wife to be mindful of carbohydrate intake to no more than 4-5 carb choices (60-75 grams) per meal and reduce portion sizes.      PLAN Outreach pt next month for telephone assessment Assess CBG, weight Reinforce portion control  Jacqlyn Larsen [facility], [place] Coordinator [phone]

## 2019-02-27 ENCOUNTER — Ambulatory Visit: Payer: Self-pay | Admitting: Pharmacist

## 2019-02-28 ENCOUNTER — Ambulatory Visit: Payer: Self-pay | Admitting: Pharmacist

## 2019-03-03 DIAGNOSIS — Z961 Presence of intraocular lens: Secondary | ICD-10-CM | POA: Diagnosis not present

## 2019-03-03 DIAGNOSIS — E113393 Type 2 diabetes mellitus with moderate nonproliferative diabetic retinopathy without macular edema, bilateral: Secondary | ICD-10-CM | POA: Diagnosis not present

## 2019-03-03 DIAGNOSIS — H40013 Open angle with borderline findings, low risk, bilateral: Secondary | ICD-10-CM | POA: Diagnosis not present

## 2019-03-03 DIAGNOSIS — H35033 Hypertensive retinopathy, bilateral: Secondary | ICD-10-CM | POA: Diagnosis not present

## 2019-03-03 LAB — HM DIABETES EYE EXAM

## 2019-03-06 ENCOUNTER — Other Ambulatory Visit: Payer: Self-pay | Admitting: Pharmacist

## 2019-03-06 NOTE — Patient Outreach (Signed)
[facility]) [place]  03/06/2019  Manuel Kline 1932/03/30 [id number]  Reason for referral: medication assistance/management  Successful call to patient's wife, Manuel Kline to confirm new insulin dosing.  Patient assistance program will provide patient with a new insulin regimen.  Per Dr. Warrick Parisian, patient will transition to Basaglar (insulin glargine) 25 units sq at bedtime and Humulin R 30 units twice daily with meals (for now).  He will discontinue Humulin N.  Patient's BGs continue to be in the 100-200 depending on diet.  Phone number ([place]) to set up shipment of new insulin was provided to patient's wife. She verbalizes understanding on the new plan.  Encouraged patient & wife to call if any issues arise.  PLAN: -I will follow up with patient next month to ensure smooth transition     Regina Eck, PharmD, [place]  [phone]

## 2019-03-08 ENCOUNTER — Other Ambulatory Visit: Payer: Self-pay

## 2019-03-08 ENCOUNTER — Ambulatory Visit (INDEPENDENT_AMBULATORY_CARE_PROVIDER_SITE_OTHER): Payer: Medicare Other | Admitting: Cardiology

## 2019-03-08 ENCOUNTER — Encounter: Payer: Self-pay | Admitting: Cardiology

## 2019-03-08 VITALS — BP 124/60 | HR 68 | Ht 65.0 in | Wt 231.4 lb

## 2019-03-08 DIAGNOSIS — I472 Ventricular tachycardia: Secondary | ICD-10-CM

## 2019-03-08 DIAGNOSIS — Z95810 Presence of automatic (implantable) cardiac defibrillator: Secondary | ICD-10-CM | POA: Diagnosis not present

## 2019-03-08 DIAGNOSIS — E782 Mixed hyperlipidemia: Secondary | ICD-10-CM | POA: Diagnosis not present

## 2019-03-08 DIAGNOSIS — I255 Ischemic cardiomyopathy: Secondary | ICD-10-CM | POA: Diagnosis not present

## 2019-03-08 DIAGNOSIS — I4729 Other ventricular tachycardia: Secondary | ICD-10-CM

## 2019-03-08 DIAGNOSIS — Z951 Presence of aortocoronary bypass graft: Secondary | ICD-10-CM

## 2019-03-08 NOTE — Patient Instructions (Addendum)

## 2019-03-08 NOTE — Progress Notes (Signed)
Cardiology Office Note:    Date:  03/08/2019   ID:  HEZEKIAH VELTRE, DOB Oct 27, 1931, MRN [id number]  PCP:  Dettinger, Fransisca Kaufmann, MD  Cardiologist:  Jenne Campus, MD  Electrophysiologist:  Cristopher Peru, MD   Referring MD: Dettinger, Fransisca Kaufmann, MD   Chief Complaint: 84 yo male presents for 6 month follow up of cardiac conditions including CAD, congestive heart failure, ischemic cardiomyopathy.   History of Present Illness:    Manuel Kline is a 85 y.o. male with a hx of CAD s/p CABG, CHF, ischemic cardiomyopathy, HLD, CKDIV followed by nephrology, OSA on CPAP, ICD followed by EP. Last seen by me 09/29/18. Presents today for 6 month follow up. Of note, he was hospitalized 12/25/18-12/27/18 for syncope and neurological symptoms apparently he was confused for about an hour and a half.- CT head was negative, but due to kidney function and PPM were unable to perform CTA or MRI - follows with neurology for presumed stroke.  Wise doing well.  Denies have any chest pain tightness squeezing pressure burning chest.  The only sequela of his presumed CVA is is unsteady on walking he use cane now.  Cardiac wise seems to be doing well  Echo 10/13/18 with EF 15-72%, LV diastolic parameters consistent with impaired relaxation, mild dilation of ascending aorta measuring 67mm.  Echo bubble study 12/26/18 with EF 55-60%.   Past Medical History:  Diagnosis Date  . Adenomatous colon polyp 2006  . CAD (coronary artery disease)   . Calcium oxalate renal stones   . Cardiomyopathy   . Cataract   . Diabetes ([place])   . Erectile dysfunction   . Erosive esophagitis   . Hemorrhoids   . HLD (hyperlipidemia)   . HTN (hypertension)   . Hypertensive heart disease without CHF 07/31/2011  . ICD (implantable cardiac defibrillator) in place   . ICD dual chamber in situ   . Metabolic syndrome   . Morbid obesity ([place])   . Osteoarthritis   . S/P CABG (coronary artery bypass graft) 11/02/2000  . Sleep apnea     Past Surgical  History:  Procedure Laterality Date  . ABDOMINAL EXPLORATION SURGERY    . BACK SURGERY     X'3  . cardiac bypass    . CARDIAC DEFIBRILLATOR PLACEMENT    . CARPAL TUNNEL RELEASE     X2, bilateral  . CATARACT EXTRACTION    . COLONOSCOPY  06/20/2012   Procedure: COLONOSCOPY;  Surgeon: Sable Feil, MD;  Location: [facility];  Service: Endoscopy;  Laterality: N/A;  . DOPPLER ECHOCARDIOGRAPHY  2003  . EP IMPLANTABLE DEVICE N/A 07/14/2016   Procedure: ICD Generator Changeout;  Surgeon: Evans Lance, MD;  Location: [facility];  Service: Cardiovascular;  Laterality: N/A;  . ESOPHAGOGASTRODUODENOSCOPY  06/20/2012   Procedure: ESOPHAGOGASTRODUODENOSCOPY (EGD);  Surgeon: Sable Feil, MD;  Location: [facility];  Service: Endoscopy;  Laterality: N/A;  . EYE SURGERY    . LAPAROTOMY    . RETINOPATHY SURGERY Bilateral   . rotator cuff surgery     left    Current Medications: Current Meds  Medication Sig  . allopurinol (ZYLOPRIM) 300 MG tablet TAKE 1/2 (ONE-HALF) TABLET BY MOUTH ONCE DAILY  . atorvastatin (LIPITOR) 80 MG tablet Take 0.5 tablets (40 mg total) by mouth daily.  . cloNIDine (CATAPRES) 0.1 MG tablet Take 1 tablet (0.1 mg total) by mouth 2 (two) times daily.  . clopidogrel (PLAVIX) 75 MG tablet Take 1 tablet (75 mg total)  by mouth daily.  . Cyanocobalamin (VITAMIN B 12 PO) Take 1,000 mcg by mouth daily.  . DULoxetine (CYMBALTA) 30 MG capsule Take 1 capsule (30 mg total) by mouth daily.  . furosemide (LASIX) 40 MG tablet Take 1 tablet (40 mg total) by mouth 2 (two) times daily.  Marland Kitchen gabapentin (NEURONTIN) 300 MG capsule Take 1 capsule (300 mg total) by mouth 2 (two) times daily.  Marland Kitchen glucose blood (ACCU-CHEK GUIDE) test strip 1 each by Other route 5 (five) times daily. Use as instructed  . Insulin Glargine (BASAGLAR KWIKPEN) 100 UNIT/ML SOPN Inject 0.25 mLs (25 Units total) into the skin daily.  . Insulin Pen Needle 29G X 12.7MM MISC Use ti inject insulin 5 times a  day  . insulin regular (HUMULIN R) 100 units/mL injection Inject 0.3 mLs (30 Units total) into the skin 2 (two) times daily before a meal.  . metoprolol succinate (TOPROL-XL) 50 MG 24 hr tablet Take 1 tablet (50 mg total) by mouth daily.  . nitroGLYCERIN (NITROSTAT) 0.4 MG SL tablet Place 0.4 mg under the tongue every 5 (five) minutes as needed for chest pain.  . Omega-3 Fatty Acids (FISH OIL) 1000 MG CPDR Take 1 tablet by mouth daily.  . polyethylene glycol powder (GLYCOLAX/MIRALAX) powder Take 17 g by mouth 2 (two) times daily as needed.  . quinapril (ACCUPRIL) 40 MG tablet TAKE 1 TABLET BY MOUTH AT BEDTIME (Patient taking differently: Take 40 mg by mouth daily. )     Allergies:   Patient has no known allergies.   Social History   Socioeconomic History  . Marital status: Married    Spouse name: Not on file  . Number of children: 4  . Years of education: 75  . Highest education level: 11th grade  Occupational History  . Occupation: Retired from [employer]: RETIRED  Social Needs  . Financial resource strain: Not hard at all  . Food insecurity    Worry: Never true    Inability: Never true  . Transportation needs    Medical: No    Non-medical: No  Tobacco Use  . Smoking status: Never Smoker  . Smokeless tobacco: Never Used  Substance and Sexual Activity  . Alcohol use: No    Alcohol/week: 0.0 standard drinks  . Drug use: No  . Sexual activity: Yes  Lifestyle  . Physical activity    Days per week: 0 days    Minutes per session: 0 min  . Stress: Not at all  Relationships  . Social connections    Talks on phone: More than three times a week    Gets together: More than three times a week    Attends religious service: More than 4 times per year    Active member of club or organization: Yes    Attends meetings of clubs or organizations: More than 4 times per year    Relationship status: Married  Other Topics Concern  . Not on file  Social History  Narrative   Right handed, Married, 4 kids from previous marriage.  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 Caffeine 1 cup daily.     Family History: The patient's family history includes Cancer in his brother; Diabetes in his brother, brother, brother, mother, sister, sister, sister, sister, and sister; Heart disease in his brother, father, and mother; Kidney disease in his sister; Throat cancer in his paternal uncle.  ROS:   Please see the history of present illness.    All 12 review  of system negative except as described per history of present illness All other systems reviewed and are negative.  EKGs/Labs/Other Studies Reviewed:    The following studies were reviewed today:  Echo 10/13/18 1. The left ventricle has mildly reduced systolic function, with an ejection fraction of 45-50%. Left ventricular diastolic Doppler parameters are consistent with impaired relaxation.  2. The mitral valve is normal in structure. No evidence of mitral valve stenosis.  3. The tricuspid valve is normal in structure.  4. The aortic valve is tricuspid There is mild thickening and mild calcification of the aortic valve.  5. The aortic root is normal in size and structure.  6. There is mild dilatation of the ascending aorta measuring 36 mm.  Carotid US 12/26/18 Left Carotid: Velocities in the left ICA are consistent with a 1-39% stenosis.  See technical comments listed above.   Vertebrals:  Bilateral vertebral arteries demonstrate antegrade flow. Subclavians: Normal flow hemodynamics were seen in bilateral subclavian arteries.  Echo Bubble Study 12/26/18 1. The left ventricle has normal systolic function, with an ejection fraction of 55-60%. The cavity size was normal. There is mildly increased left ventricular wall thickness. Left ventricular diastolic Doppler parameters are consistent with impaired relaxation. No evidence of left ventricular regional wall motion abnormalities.  2. There is mild mitral annular calcification  present. No evidence of mitral valve stenosis. No significant mitral regurgitation.  3. The aortic valve is tricuspid Mild calcification of the aortic valve. No aortic stenosis.  4. The aortic root is normal in size and structure.  5. The right ventricle has normal systolc function. The cavity was normal.  6. The IVC was not well-visualized. Peak RV-RA gradient 35 mmHg.   Recent Labs: 12/25/2018: TSH 1.421 01/19/2019: ALT 25; BUN 43; Creatinine, Ser 2.03; Hemoglobin 15.1; Platelets 222; Potassium 4.5; Sodium 139   Recent Lipid Panel    Component Value Date/Time   CHOL 140 01/19/2019 1214   TRIG 200 (H) 01/19/2019 1214   HDL 41 01/19/2019 1214   CHOLHDL 3.4 01/19/2019 1214   CHOLHDL 3.4 12/26/2018 0558   VLDL 36 12/26/2018 0558   LDLCALC 59 01/19/2019 1214    Physical Exam:    VS:  BP 124/60   Pulse 68   Ht 5\' 5"  (1.651 m)   Wt 231 lb 6.4 oz (105 kg)   SpO2 98%   BMI 38.51 kg/m     Wt Readings from Last 3 Encounters:  03/08/19 231 lb 6.4 oz (105 kg)  02/22/19 235 lb (106.6 kg)  01/19/19 233 lb 3.2 oz (105.8 kg)     GEN:  Well nourished, well developed in no acute distress HEENT: Normal NECK: No JVD; No carotid bruits LYMPHATICS: No lymphadenopathy CARDIAC: RRR, no murmurs, rubs, gallops RESPIRATORY:   Clear to auscultation without rales, wheezing or rhonchi  ABDOMEN: Soft, non-tender, non-distended MUSCULOSKELETAL:  No  edema; No deformity  SKIN: Warm and dry NEUROLOGIC:  Alert and oriented x 3 PSYCHIATRIC:  Normal affect   ASSESSMENT:    1. S/P CABG (coronary artery bypass graft)   2. Mixed hyperlipidemia   3. Ischemic cardiomyopathy   4. Ventricular tachycardia (paroxysmal) ([place])   5. Presence of automatic (implantable) cardiac defibrillator    PLAN:    In order of problems listed above:  1. Coronary artery disease, status post coronary bypass graft.  Stable from that point of view.  Denies having any chest pain tightness squeezing pressure burning chest.  2. History of ventricular tachycardia.  ICD implanted.  I review interrogation still planned about her left in the device.  No therapy delivered no arrhythmia detected. 3. History of presumed CVA.  Repeated CT showed no acute lesions just chronic changes.  Quite extensive evaluation has been done which seems to be negative so far.  Overall he seems to be doing well but does have some difficulty moving around. 4. Dyslipidemia we will continue with Lipitor 80 mg.  See him back in the office in 3 months or sooner if he has a problem   Medication Adjustments/Labs and Tests Ordered: Current medicines are reviewed at length with the patient today.  Concerns regarding medicines are outlined above.  No orders of the defined types were placed in this encounter.  No orders of the defined types were placed in this encounter.   Patient Instructions  Medication Instructions:  Your physician recommends that you continue on your current medications as directed. Please refer to the Current Medication list given to you today.  If you need a refill on your cardiac medications before your next appointment, please call your pharmacy.   Lab work: None If you have labs (blood work) drawn today and your tests are completely normal, you will receive your results only by: Marland Kitchen MyChart Message (if you have MyChart) OR . A paper copy in the mail If you have any lab test that is abnormal or we need to change your treatment, we will call you to review the results.  Testing/Procedures: None  Follow-Up: At [facility], you and your health needs are our priority.  As part of our continuing mission to provide you with exceptional heart care, we have created designated Provider Care Teams.  These Care Teams include your primary Cardiologist (physician) and Advanced Practice Providers (APPs -  Physician Assistants and Nurse Practitioners) who all work together to provide you with the care you need, when you need it.  You will need a follow up appointment in 3 months.  Any Other Special Instructions Will Be Listed Below (If Applicable).       Signed, Jenne Campus, MD  03/08/2019 9:54 AM    [facility]

## 2019-03-13 ENCOUNTER — Telehealth: Payer: Self-pay | Admitting: Internal Medicine

## 2019-03-13 DIAGNOSIS — G4733 Obstructive sleep apnea (adult) (pediatric): Secondary | ICD-10-CM | POA: Diagnosis not present

## 2019-03-13 NOTE — Telephone Encounter (Signed)
New Message        COVID-19 Pre-Screening Questions:   In the past 7 to 10 days have you had a cough,  shortness of breath, headache, congestion, fever (100 or greater) body aches, chills, sore throat, or sudden loss of taste or sense of smell? NO  Have you been around anyone with known Covid 19. NO  Have you been around anyone who is awaiting Covid 19 test results in the past 7 to 10 days? NO  Have you been around anyone who has been exposed to Covid 19, or has mentioned symptoms of Covid 19 within the past 7 to 10 days? NO Pts daughter is bringing him to the appt and assisting him because he is hard of hearing. Pts daughter answered NO to all questions   If you have any concerns/questions about symptoms patients report during screening (either on the phone or at threshold). Contact the provider seeing the patient or DOD for further guidance.  If neither are available contact a member of the leadership team.

## 2019-03-14 ENCOUNTER — Encounter: Payer: Self-pay | Admitting: Internal Medicine

## 2019-03-14 ENCOUNTER — Ambulatory Visit (INDEPENDENT_AMBULATORY_CARE_PROVIDER_SITE_OTHER): Payer: Medicare Other | Admitting: Internal Medicine

## 2019-03-14 ENCOUNTER — Other Ambulatory Visit: Payer: Self-pay

## 2019-03-14 VITALS — BP 128/72 | HR 72 | Ht 65.0 in | Wt 231.0 lb

## 2019-03-14 DIAGNOSIS — I472 Ventricular tachycardia: Secondary | ICD-10-CM

## 2019-03-14 DIAGNOSIS — I10 Essential (primary) hypertension: Secondary | ICD-10-CM

## 2019-03-14 DIAGNOSIS — I255 Ischemic cardiomyopathy: Secondary | ICD-10-CM | POA: Diagnosis not present

## 2019-03-14 DIAGNOSIS — I5022 Chronic systolic (congestive) heart failure: Secondary | ICD-10-CM

## 2019-03-14 DIAGNOSIS — C44229 Squamous cell carcinoma of skin of left ear and external auricular canal: Secondary | ICD-10-CM | POA: Diagnosis not present

## 2019-03-14 DIAGNOSIS — D485 Neoplasm of uncertain behavior of skin: Secondary | ICD-10-CM | POA: Diagnosis not present

## 2019-03-14 DIAGNOSIS — Z95810 Presence of automatic (implantable) cardiac defibrillator: Secondary | ICD-10-CM | POA: Diagnosis not present

## 2019-03-14 DIAGNOSIS — L989 Disorder of the skin and subcutaneous tissue, unspecified: Secondary | ICD-10-CM | POA: Diagnosis not present

## 2019-03-14 DIAGNOSIS — I4729 Other ventricular tachycardia: Secondary | ICD-10-CM

## 2019-03-14 LAB — CUP PACEART INCLINIC DEVICE CHECK
Battery Remaining Longevity: 80 mo
Battery Voltage: 3 V
Brady Statistic AP VP Percent: 0.11 %
Brady Statistic AP VS Percent: 90.93 %
Brady Statistic AS VP Percent: 0.01 %
Brady Statistic AS VS Percent: 8.95 %
Brady Statistic RA Percent Paced: 91.02 %
Brady Statistic RV Percent Paced: 0.11 %
Date Time Interrogation Session: 20200714144801
HighPow Impedance: 58 Ohm
HighPow Impedance: 79 Ohm
Implantable Lead Implant Date: 20030519
Implantable Lead Implant Date: 20030519
Implantable Lead Location: 753859
Implantable Lead Location: 753860
Implantable Lead Model: 158
Implantable Lead Model: 4087
Implantable Lead Serial Number: 115102
Implantable Lead Serial Number: 159999
Implantable Pulse Generator Implant Date: 20171114
Lead Channel Impedance Value: 399 Ohm
Lead Channel Impedance Value: 399 Ohm
Lead Channel Impedance Value: 4047 Ohm
Lead Channel Impedance Value: 4047 Ohm
Lead Channel Impedance Value: 4047 Ohm
Lead Channel Impedance Value: 456 Ohm
Lead Channel Pacing Threshold Amplitude: 0.875 V
Lead Channel Pacing Threshold Amplitude: 1.25 V
Lead Channel Pacing Threshold Pulse Width: 0.4 ms
Lead Channel Pacing Threshold Pulse Width: 0.4 ms
Lead Channel Sensing Intrinsic Amplitude: 14.5 mV
Lead Channel Sensing Intrinsic Amplitude: 15 mV
Lead Channel Sensing Intrinsic Amplitude: 2.25 mV
Lead Channel Sensing Intrinsic Amplitude: 2.375 mV
Lead Channel Setting Pacing Amplitude: 2.5 V
Lead Channel Setting Pacing Amplitude: 2.5 V
Lead Channel Setting Pacing Pulse Width: 0.4 ms
Lead Channel Setting Sensing Sensitivity: 0.3 mV

## 2019-03-14 NOTE — Patient Instructions (Signed)
Medication Instructions:  Your physician recommends that you continue on your current medications as directed. Please refer to the Current Medication list given to you today.  Labwork: None ordered.  Testing/Procedures: None ordered.  Follow-Up: Your physician wants you to follow-up in: one year with Dr. Lovena Le.   You will receive a reminder letter in the mail two months in advance. If you don't receive a letter, please call our office to schedule the follow-up appointment.  Remote monitoring is used to monitor your ICD from home. This monitoring reduces the number of office visits required to check your device to one time per year. It allows [place] to keep an eye on the functioning of your device to ensure it is working properly. You are scheduled for a device check from home on 04/26/2019. You may send your transmission at any time that day. If you have a wireless device, the transmission will be sent automatically. After your physician reviews your transmission, you will receive a postcard with your next transmission date.  Any Other Special Instructions Will Be Listed Below (If Applicable).  If you need a refill on your cardiac medications before your next appointment, please call your pharmacy.

## 2019-03-14 NOTE — Progress Notes (Signed)
HPI Mr. Wilms returns today for followup. He is a pleasant 85 yo man with a h/o HTN, CAD, s/p CABG, OSA, VT s/p ICD insertion. In the interim, he has been stable but does note some problems with peripheral neuropathy. He had a TIA several weeks ago. No chest pain. No edema.  No Known Allergies   Current Outpatient Medications  Medication Sig Dispense Refill  . allopurinol (ZYLOPRIM) 300 MG tablet TAKE 1/2 (ONE-HALF) TABLET BY MOUTH ONCE DAILY 45 tablet 1  . atorvastatin (LIPITOR) 80 MG tablet Take 0.5 tablets (40 mg total) by mouth daily. 45 tablet 5  . cloNIDine (CATAPRES) 0.1 MG tablet Take 1 tablet (0.1 mg total) by mouth 2 (two) times daily. 180 tablet 3  . clopidogrel (PLAVIX) 75 MG tablet Take 1 tablet (75 mg total) by mouth daily. 90 tablet 3  . Cyanocobalamin (VITAMIN B 12 PO) Take 1,000 mcg by mouth daily.    . DULoxetine (CYMBALTA) 30 MG capsule Take 1 capsule (30 mg total) by mouth daily. 90 capsule 3  . furosemide (LASIX) 40 MG tablet Take 1 tablet (40 mg total) by mouth 2 (two) times daily. 180 tablet 3  . gabapentin (NEURONTIN) 300 MG capsule Take 1 capsule (300 mg total) by mouth 2 (two) times daily. 180 capsule 3  . glucose blood (ACCU-CHEK GUIDE) test strip 1 each by Other route 5 (five) times daily. Use as instructed 400 each 11  . Insulin Glargine (BASAGLAR KWIKPEN) 100 UNIT/ML SOPN Inject 0.25 mLs (25 Units total) into the skin daily.    . Insulin Pen Needle 29G X 12.7MM MISC Use ti inject insulin 5 times a day 150 each 3  . insulin regular (HUMULIN R) 100 units/mL injection Inject 0.3 mLs (30 Units total) into the skin 2 (two) times daily before a meal. 60 mL 3  . metoprolol succinate (TOPROL-XL) 50 MG 24 hr tablet Take 1 tablet (50 mg total) by mouth daily. 90 tablet 3  . nitroGLYCERIN (NITROSTAT) 0.4 MG SL tablet Place 0.4 mg under the tongue every 5 (five) minutes as needed for chest pain.    . Omega-3 Fatty Acids (FISH OIL) 1000 MG CPDR Take 1 tablet by mouth  daily.    . polyethylene glycol powder (GLYCOLAX/MIRALAX) powder Take 17 g by mouth 2 (two) times daily as needed. 3350 g 1  . quinapril (ACCUPRIL) 40 MG tablet TAKE 1 TABLET BY MOUTH AT BEDTIME (Patient taking differently: Take 40 mg by mouth daily. ) 90 tablet 1   No current facility-administered medications for this visit.      Past Medical History:  Diagnosis Date  . Adenomatous colon polyp 2006  . CAD (coronary artery disease)   . Calcium oxalate renal stones   . Cardiomyopathy   . Cataract   . Diabetes ([place])   . Erectile dysfunction   . Erosive esophagitis   . Hemorrhoids   . HLD (hyperlipidemia)   . HTN (hypertension)   . Hypertensive heart disease without CHF 07/31/2011  . ICD (implantable cardiac defibrillator) in place   . ICD dual chamber in situ   . Metabolic syndrome   . Morbid obesity ([place])   . Osteoarthritis   . S/P CABG (coronary artery bypass graft) 11/02/2000  . Sleep apnea     ROS:   All systems reviewed and negative except as noted in the HPI.   Past Surgical History:  Procedure Laterality Date  . ABDOMINAL EXPLORATION SURGERY    . BACK  SURGERY     X'3  . cardiac bypass    . CARDIAC DEFIBRILLATOR PLACEMENT    . CARPAL TUNNEL RELEASE     X2, bilateral  . CATARACT EXTRACTION    . COLONOSCOPY  06/20/2012   Procedure: COLONOSCOPY;  Surgeon: Sable Feil, MD;  Location: [facility];  Service: Endoscopy;  Laterality: N/A;  . DOPPLER ECHOCARDIOGRAPHY  2003  . EP IMPLANTABLE DEVICE N/A 07/14/2016   Procedure: ICD Generator Changeout;  Surgeon: Evans Lance, MD;  Location: [facility];  Service: Cardiovascular;  Laterality: N/A;  . ESOPHAGOGASTRODUODENOSCOPY  06/20/2012   Procedure: ESOPHAGOGASTRODUODENOSCOPY (EGD);  Surgeon: Sable Feil, MD;  Location: [facility];  Service: Endoscopy;  Laterality: N/A;  . EYE SURGERY    . LAPAROTOMY    . RETINOPATHY SURGERY Bilateral   . rotator cuff surgery     left     Family History   Problem Relation Age of Onset  . Diabetes Brother   . Heart disease Father   . Heart disease Mother   . Diabetes Mother   . Diabetes Brother   . Diabetes Brother   . Cancer Brother        baldder  . Heart disease Brother   . Diabetes Sister   . Diabetes Sister   . Kidney disease Sister        dialysis  . Diabetes Sister   . Diabetes Sister   . Diabetes Sister   . Throat cancer Paternal Uncle      Social History   Socioeconomic History  . Marital status: Married    Spouse name: Not on file  . Number of children: 4  . Years of education: 22  . Highest education level: 11th grade  Occupational History  . Occupation: Retired from [employer]: RETIRED  Social Needs  . Financial resource strain: Not hard at all  . Food insecurity    Worry: Never true    Inability: Never true  . Transportation needs    Medical: No    Non-medical: No  Tobacco Use  . Smoking status: Never Smoker  . Smokeless tobacco: Never Used  Substance and Sexual Activity  . Alcohol use: No    Alcohol/week: 0.0 standard drinks  . Drug use: No  . Sexual activity: Yes  Lifestyle  . Physical activity    Days per week: 0 days    Minutes per session: 0 min  . Stress: Not at all  Relationships  . Social connections    Talks on phone: More than three times a week    Gets together: More than three times a week    Attends religious service: More than 4 times per year    Active member of club or organization: Yes    Attends meetings of clubs or organizations: More than 4 times per year    Relationship status: Married  . Intimate partner violence    Fear of current or ex partner: No    Emotionally abused: No    Physically abused: No    Forced sexual activity: No  Other Topics Concern  . Not on file  Social History Narrative   Right handed, Married, 4 kids from previous marriage.  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 Caffeine 1 cup daily.     BP 128/72   Pulse 72   Ht 5\' 5"  (1.651 m)   Wt  231 lb (104.8 kg)   SpO2 96%   BMI 38.44 kg/m  Physical Exam:  Well appearing NAD HEENT: Unremarkable Neck:  No JVD, no thyromegally Lymphatics:  No adenopathy Back:  No CVA tenderness Lungs:  Clear with no wheezes HEART:  Regular rate rhythm, no murmurs, no rubs, no clicks Abd:  soft, positive bowel sounds, no organomegally, no rebound, no guarding Ext:  2 plus pulses, no edema, no cyanosis, no clubbing Skin:  No rashes no nodules Neuro:  CN II through XII intact, motor grossly intact  EKG - none  DEVICE  Normal device function.  See PaceArt for details.   Assess/Plan: 1. Chronic systolic heart failure- his symptoms are class 2. He will continue his current meds. 2. ICD - his device is working normally.  3. Obesity =- he has lost some weight. I have encouraged him lose more and increase his physical activity.   Eulan Heyward,M.D. 2.

## 2019-03-21 ENCOUNTER — Encounter: Payer: Self-pay | Admitting: *Deleted

## 2019-03-21 ENCOUNTER — Other Ambulatory Visit: Payer: Self-pay | Admitting: *Deleted

## 2019-03-21 ENCOUNTER — Other Ambulatory Visit: Payer: Self-pay | Admitting: Pharmacist

## 2019-03-21 ENCOUNTER — Ambulatory Visit: Payer: Self-pay | Admitting: Pharmacist

## 2019-03-21 DIAGNOSIS — E1169 Type 2 diabetes mellitus with other specified complication: Secondary | ICD-10-CM

## 2019-03-21 NOTE — Patient Outreach (Signed)
Outreach call to patient's wife for telephone assessment, per wife, pt continues to weigh and has lost weight recently upon the advice of doctors, weight is down to 225 pounds,  CBG today 200 and this depends on what pt eats, per wife, pt does not always eat as he should and has a very good appetite.  Pt recently saw dermatologist and has skin cancer to left ear and will be getting this treated.  Pt is now on new insulin and is doing well with this regimen,  [facility] pharmacist continues to assist pt,  Wife would like continued support of RN health coach for pt as she feels reinforcement for diabetes and HF is helpful.    THN CM Care Plan Problem One     Most Recent Value  Care Plan Problem One  Knowledge deficit related to CVA, CHF  Role Documenting the Problem One  Care Management Coordinator  Care Plan for Problem One  Active  THN Long Term Goal   Pt will verbalize/ demonstrate improved self care related to CVA, CHF  THN Long Term Goal Start Date  12/29/18  [facility] Long Term Goal Met Date  03/21/19  Interventions for Problem One Long Term Goal  RN CM reviewed plan of care with pt and wife, discussed transfer to RN health coach for continued reinforcement of DM, HF.  RN CM faxed update letter to primary MD informing of transfer to  RN health coach, placed order for [facility] RN health coach., reviewed medications with wife.  THN CM Short Term Goal #1   Pt will verbalize signs/ symptoms CVA and ways to prevent in 30 days  THN CM Short Term Goal #1 Start Date  01/19/19 [goal re-establishsed]  THN CM Short Term Goal #1 Met Date  02/22/19  THN CM Short Term Goal #2   Pt will verbalize CHF zones/ action plan within 30 days  THN CM Short Term Goal #2 Start Date  02/22/19 [goal re-established]  THN CM Short Term Goal #2 Met Date  03/21/19  Interventions for Short Term Goal #2  RN CM reviewed HF action plan, pt is in green zone today.  THN CM Short Term Goal #3  Pt will obtain digital scale and weigh and record in [facility]  calendar within 30 days  THN CM Short Term Goal #3 Start Date  12/29/18  [facility] CM Short Term Goal #3 Met Date  01/26/19 [pt is weighing daily and recording]    [facility] CM Care Plan Problem Two     Most Recent Value  Care Plan Problem Two  Knowledge deficit related to diabetes  Role Documenting the Problem Two  Care Management Coordinator  Care Plan for Problem Two  Active  Interventions for Problem Two Long Term Goal   RN CM reviewed most recent CBG readings, 200 today, reviewed importance of good blood sugar control for overall health and importance of maintaining nutritious diet with carbohydrate modification  THN Long Term Goal  Pt will demonstrate improved self care for diabetes aeb by decrease in AIC of 1-2 points in 90 days  THN Long Term Goal Start Date  03/21/19  THN CM Short Term Goal #1   Pt will decrease carbohydrate intake and portion sizes within 30 days  THN CM Short Term Goal #1 Start Date  03/21/19 [goal re-established]  Interventions for Short Term Goal #2   RN CM reviewed importance of portion control for continued weight loss and improvement in CBG readings, reviewed plate method and foods  high in carbohydrates to limit      PLAN Case closed for complex care management Case transferred to RN health coach  Jacqlyn Larsen [facility], [place] Coordinator [phone]

## 2019-03-21 NOTE — Patient Outreach (Signed)
[place] [facility]) [place]  03/21/2019  Manuel Kline 15-May-1932 [id number]   Reason for referral: Medication Assistance  Reason for call:  Call placed to patient and wife to follow up on new insulin and regimen  Outreach:  Unsuccessful telephone call attempt to patient.   HIPAA compliant voicemail left requesting a return call  Plan:  -I will make another outreach attempt to patient within 3-4 business days.    Regina Eck, PharmD, [place]  [phone]

## 2019-03-22 ENCOUNTER — Telehealth: Payer: Self-pay | Admitting: *Deleted

## 2019-03-22 NOTE — Telephone Encounter (Signed)
Pt called, need Ct results. Please call [phone]

## 2019-03-22 NOTE — Telephone Encounter (Signed)
l called pt about his CT scan results. I stated it was via mychart for him to review. The husband put his wife on the phone ALice on dpr.  I explain to wife were they able to access his mychart account for [facility].THe wife stated  Edmonia Lynch their daughter in law   set up the mychart account for [facility].I informed the wife  that CT scan of the head showed age-related changes of mild shrinkage of the brain. No acute or worrisome findings. The wife verbalized understanding and will have her daughter in law show them how to access mychart.

## 2019-03-24 ENCOUNTER — Ambulatory Visit: Payer: Self-pay | Admitting: Pharmacist

## 2019-03-24 ENCOUNTER — Other Ambulatory Visit: Payer: Self-pay | Admitting: Pharmacist

## 2019-03-24 NOTE — Patient Outreach (Signed)
[place] [facility]) [place]  03/24/2019  SEBASTIN PERLMUTTER [birth year] [id number]   Reason for referral: Medication Assistance/Care coordination  Reason for call: Call placed to patient and wife to follow up on new insulin and regimen  Outreach:  Unsuccessful telephone call attempt #2 to patient.   HIPAA compliant voicemail left requesting a return call  Plan:  -I will make another outreach attempt to patient in the next 2 weeks.     Regina Eck, PharmD, [place]  [phone]

## 2019-03-27 ENCOUNTER — Encounter: Payer: Self-pay | Admitting: *Deleted

## 2019-03-27 ENCOUNTER — Ambulatory Visit (INDEPENDENT_AMBULATORY_CARE_PROVIDER_SITE_OTHER): Payer: Medicare Other | Admitting: *Deleted

## 2019-03-27 DIAGNOSIS — Z0000 Encounter for general adult medical examination without abnormal findings: Secondary | ICD-10-CM | POA: Diagnosis not present

## 2019-03-27 NOTE — Progress Notes (Signed)
MEDICARE ANNUAL WELLNESS VISIT  03/27/2019  Telephone Visit Disclaimer This Medicare AWV was conducted by telephone due to national recommendations for restrictions regarding the COVID-19 Pandemic (e.g. social distancing).  I verified, using two identifiers, that I am speaking with Manuel Kline or their authorized healthcare agent. I discussed the limitations, risks, security, and privacy concerns of performing an evaluation and management service by telephone and the potential availability of an in-person appointment in the future. The patient expressed understanding and agreed to proceed.   Subjective:  Manuel Kline is a 85 y.o. male patient of Dettinger, Manuel Kaufmann, MD who had a Medicare Annual Wellness Visit today via telephone. Akif is Retired and lives with their spouse. he has 4 children. he reports that he is socially active and does interact with friends/family regularly. he is minimally physically active and enjoys woodworking in his shop making bird houses and gardening in his greenhouse.  Patient Care Team: Dettinger, Manuel Kaufmann, MD as PCP - General (Family Medicine) Evans Lance, MD as PCP - Electrophysiology (Cardiology) Park Liter, MD as PCP - Cardiology (Cardiology) Regenia Skeeter, MD as Consulting Physician (Internal Medicine) Regal, Tamala Fothergill, DPM as Consulting Physician (Podiatry) Jacolyn Reedy, MD as Consulting Physician (Cardiology) Zadie Rhine Clent Demark, MD as Consulting Physician (Ophthalmology) Evans Lance, MD as Consulting Physician (Cardiology) Gardiner Barefoot, DPM as Consulting Physician (Podiatry) Manuel Mends, RN as [place] Management Manuel Mends, RN as [place] Management [place], Royce Macadamia, [facility] as [employer] [place] Management (Pharmacist) [name], RN as [place] Management  Advanced Directives 03/27/2019 12/29/2018 12/25/2018 05/26/2018 03/23/2018 10/20/2017  10/12/2017  Does Patient Have a Medical Advance Directive? Yes Yes Yes Yes Yes Yes Yes  Type of Advance Directive Living will;Healthcare Power of [place];Living will - - Healthcare Power of [place]  Does patient want to make changes to medical advance directive? No - Patient declined - - No - Patient declined - - No - Patient declined  Copy of [place] in Chart? No - copy requested No - copy requested - - Yes [facility] Utilization Over the Past 12 Months: # of hospitalizations or ER visits: 1 # of surgeries: 0  Review of Systems    Patient reports that his overall health is unchanged compared to last year.  Patient Reported Readings (BP, Pulse, CBG, Weight, etc) none  Review of Systems: No complaints  All other systems negative.  Pain Assessment Pain : No/denies pain     Current Medications & Allergies (verified) Allergies as of 03/27/2019   No Known Allergies     Medication List       Accurate as of March 27, 2019 11:15 AM. If you have any questions, ask your nurse or doctor.        allopurinol 300 MG tablet Commonly known as: ZYLOPRIM TAKE 1/2 (ONE-HALF) TABLET BY MOUTH ONCE DAILY   atorvastatin 80 MG tablet Commonly known as: LIPITOR Take 0.5 tablets (40 mg total) by mouth daily.   Basaglar KwikPen 100 UNIT/ML Sopn Inject 0.25 mLs (25 Units total) into the skin daily.   cloNIDine 0.1 MG tablet Commonly known as: CATAPRES Take 1 tablet (0.1 mg total) by mouth 2 (two) times daily.   clopidogrel 75 MG tablet Commonly known as: PLAVIX Take 1 tablet (75 mg total) by mouth  daily.   DULoxetine 30 MG capsule Commonly known as: Cymbalta Take 1 capsule (30 mg total) by mouth daily.   Fish Oil 1000 MG Cpdr Take 1 tablet by mouth daily.   furosemide 40 MG tablet Commonly known as: LASIX Take 1 tablet (40 mg total) by mouth 2 (two) times daily.    gabapentin 300 MG capsule Commonly known as: NEURONTIN Take 1 capsule (300 mg total) by mouth 2 (two) times daily.   glucose blood test strip Commonly known as: Accu-Chek Guide 1 each by Other route 5 (five) times daily. Use as instructed   Insulin Pen Needle 29G X 12.7MM Misc Use ti inject insulin 5 times a day   insulin regular 100 units/mL injection Commonly known as: HumuLIN R Inject 0.3 mLs (30 Units total) into the skin 2 (two) times daily before a meal.   metoprolol succinate 50 MG 24 hr tablet Commonly known as: TOPROL-XL Take 1 tablet (50 mg total) by mouth daily.   nitroGLYCERIN 0.4 MG SL tablet Commonly known as: NITROSTAT Place 0.4 mg under the tongue every 5 (five) minutes as needed for chest pain.   polyethylene glycol powder 17 GM/SCOOP powder Commonly known as: GLYCOLAX/MIRALAX Take 17 g by mouth 2 (two) times daily as needed.   quinapril 40 MG tablet Commonly known as: ACCUPRIL TAKE 1 TABLET BY MOUTH AT BEDTIME What changed: when to take this   VITAMIN B 12 PO Take 1,000 mcg by mouth daily.       History (reviewed): Past Medical History:  Diagnosis Date  . Adenomatous colon polyp 2006  . CAD (coronary artery disease)   . Calcium oxalate renal stones   . Cardiomyopathy   . Cataract   . Diabetes ([place])   . Erectile dysfunction   . Erosive esophagitis   . Hemorrhoids   . HLD (hyperlipidemia)   . HTN (hypertension)   . Hypertensive heart disease without CHF 07/31/2011  . ICD (implantable cardiac defibrillator) in place   . ICD dual chamber in situ   . Metabolic syndrome   . Morbid obesity ([place])   . Osteoarthritis   . S/P CABG (coronary artery bypass graft) 11/02/2000  . Sleep apnea    Past Surgical History:  Procedure Laterality Date  . ABDOMINAL EXPLORATION SURGERY    . BACK SURGERY     X'3  . cardiac bypass    . CARDIAC DEFIBRILLATOR PLACEMENT    . CARPAL TUNNEL RELEASE     X2, bilateral  . CATARACT EXTRACTION    . COLONOSCOPY   06/20/2012   Procedure: COLONOSCOPY;  Surgeon: Sable Feil, MD;  Location: [facility];  Service: Endoscopy;  Laterality: N/A;  . DOPPLER ECHOCARDIOGRAPHY  2003  . EP IMPLANTABLE DEVICE N/A 07/14/2016   Procedure: ICD Generator Changeout;  Surgeon: Evans Lance, MD;  Location: [facility];  Service: Cardiovascular;  Laterality: N/A;  . ESOPHAGOGASTRODUODENOSCOPY  06/20/2012   Procedure: ESOPHAGOGASTRODUODENOSCOPY (EGD);  Surgeon: Sable Feil, MD;  Location: [facility];  Service: Endoscopy;  Laterality: N/A;  . EYE SURGERY    . LAPAROTOMY    . RETINOPATHY SURGERY Bilateral   . rotator cuff surgery     left   Family History  Problem Relation Age of Onset  . Diabetes Brother   . Heart disease Father   . Heart disease Mother   . Diabetes Mother   . Diabetes Brother   . Diabetes Brother   . Cancer Brother  baldder  . Heart disease Brother   . Diabetes Sister   . Diabetes Sister   . Kidney disease Sister        dialysis  . Diabetes Sister   . Diabetes Sister   . Diabetes Sister   . Throat cancer Paternal Uncle    Social History   Socioeconomic History  . Marital status: Married    Spouse name: Alice  . Number of children: 4  . Years of education: 46  . Highest education level: 11th grade  Occupational History  . Occupation: Retired from [employer]: RETIRED  Social Needs  . Financial resource strain: Not hard at all  . Food insecurity    Worry: Never true    Inability: Never true  . Transportation needs    Medical: No    Non-medical: No  Tobacco Use  . Smoking status: Never Smoker  . Smokeless tobacco: Never Used  Substance and Sexual Activity  . Alcohol use: No    Alcohol/week: 0.0 standard drinks  . Drug use: No  . Sexual activity: Yes  Lifestyle  . Physical activity    Days per week: 0 days    Minutes per session: 0 min  . Stress: Not at all  Relationships  . Social connections    Talks on phone: More than  three times a week    Gets together: More than three times a week    Attends religious service: More than 4 times per year    Active member of club or organization: Yes    Attends meetings of clubs or organizations: More than 4 times per year    Relationship status: Married  Other Topics Concern  . Not on file  Social History Narrative   Right handed, Married, 4 kids from previous marriage.  Retired.  Caffeine 1 cup daily.    Activities of Daily Living In your present state of health, do you have any difficulty performing the following activities: 03/27/2019 12/29/2018  Hearing? [name]  Comment wears hearing aids -  Vision? N N  Difficulty concentrating or making decisions? N N  Walking or climbing stairs? N N  Dressing or bathing? N N  Doing errands, shopping? N N  Preparing Food and eating ? N N  Using the Toilet? N N  In the past six months, have you accidently leaked urine? N N  Do you have problems with loss of bowel control? N N  Managing your Medications? N Y  Comment - wife assists  Managing your Finances? N N  Housekeeping or managing your Housekeeping? N Y  Comment - wife assists  Some recent data might be hidden    Patient Education/ Literacy How often do you need to have someone help you when you read instructions, pamphlets, or other written materials from your doctor or pharmacy?: 1 - Never What is the last grade level you completed in school?: 11th grade  Exercise Current Exercise Habits: The patient does not participate in regular exercise at present, Exercise limited by: cardiac condition(s)  Diet Patient reports consuming 3 meals a day and 3 snack(s) a day Patient reports that his primary diet is: Regular Patient reports that she does have regular access to food.   Depression Screen PHQ 2/9 Scores 03/27/2019 01/19/2019 12/29/2018 12/29/2018 10/20/2018 07/19/2018 05/26/2018  PHQ - 2 Score 0 0 0 0 0 0 0  PHQ- 9 Score - - - - 0 - -     Fall  Risk Fall Risk   03/27/2019 03/21/2019 12/29/2018 11/21/2018 10/20/2018  Falls in the past year? 0 1 1 0 0  Number falls in past yr: - 0 0 - -  Injury with Fall? - 0 0 - -  Comment - - - - -  Risk Factor Category  - - - - -  Risk for fall due to : - Medication side effect History of fall(s) - -  Risk for fall due to: Comment - - - - -  Follow up - Falls evaluation completed;Falls prevention discussed Falls evaluation completed - -  Comment - - - - -     Objective:  Vinayak H Mozingo seemed alert and oriented and he participated appropriately during our telephone visit.  Blood Pressure Weight BMI  BP Readings from Last 3 Encounters:  03/14/19 128/72  03/08/19 124/60  01/19/19 (!) 148/70   Wt Readings from Last 3 Encounters:  03/14/19 231 lb (104.8 kg)  03/08/19 231 lb 6.4 oz (105 kg)  02/22/19 235 lb (106.6 kg)   BMI Readings from Last 1 Encounters:  03/14/19 38.44 kg/m    *Unable to obtain current vital signs, weight, and BMI due to telephone visit type  Hearing/Vision  . Matheu did not seem to have difficulty with hearing/understanding during the telephone conversation . Reports that he has not had a formal eye exam by an eye care professional within the past year . Reports that he has not had a formal hearing evaluation within the past year *Unable to fully assess hearing and vision during telephone visit type  Cognitive Function: 6CIT Screen 03/27/2019  What Year? 0 points  What month? 0 points  What time? 0 points  Count back from 20 0 points  Months in reverse 2 points  Repeat phrase 4 points  Total Score 6   (Normal:0-7, Significant for Dysfunction: >8)  Normal Cognitive Function Screening: Yes   Immunization & Health Maintenance Record Immunization History  Administered Date(s) Administered  . Influenza, High Dose Seasonal PF 06/14/2017, 07/19/2018  . Influenza,inj,Quad PF,6+ Mos 06/12/2014, 06/24/2015, 06/17/2016  . Pneumococcal Conjugate-13 03/01/2015  . Pneumococcal  Polysaccharide-23 03/16/2016    Health Maintenance  Topic Date Due  . FOOT EXAM  10/11/2018  . TETANUS/TDAP  01/19/2020 (Originally 01/17/1951)  . INFLUENZA VACCINE  04/01/2019  . HEMOGLOBIN A1C  07/22/2019  . OPHTHALMOLOGY EXAM  03/02/2020  . PNA vac Low Risk Adult  Completed       Assessment  This is a routine wellness examination for [employer].  Health Maintenance: Due or Overdue Health Maintenance Due  Topic Date Due  . FOOT EXAM  10/11/2018    Manuel Kline does not need a referral for Community Assistance: Care Management:   no Social Work:    no Prescription Assistance:  no Nutrition/Diabetes Education:  no   Plan:  Personalized Goals Goals Addressed            This Visit's Progress   . DIET - INCREASE WATER INTAKE       Try to drink 6-8 glasses of water daily.      Personalized Health Maintenance & Screening Recommendations  Td vaccine Shingles vaccine  Lung Cancer Screening Recommended: no (Low Dose CT Chest recommended if Age 21-80 years, 30 pack-year currently smoking OR have quit w/in past 15 years) Hepatitis C Screening recommended: no HIV Screening recommended: no  Advanced Directives: Written information was not prepared per patient's request.  Referrals & Orders No orders of the  defined types were placed in this encounter.   Follow-up Plan . Follow-up with Dettinger, Manuel Kaufmann, MD as planned . Consider TDAP and Shingles vaccines at your next visit with your PCP . Bring a copy of your Advanced Directives for our records   I have personally reviewed and noted the following in the patient's chart:   . Medical and social history . Use of alcohol, tobacco or illicit drugs  . Current medications and supplements . Functional ability and status . Nutritional status . Physical activity . Advanced directives . List of other physicians . Hospitalizations, surgeries, and ER visits in previous 12 months . Vitals . Screenings to include  cognitive, depression, and falls . Referrals and appointments  In addition, I have reviewed and discussed with Manuel Kline certain preventive protocols, quality metrics, and best practice recommendations. A written personalized care plan for preventive services as well as general preventive health recommendations is available and can be mailed to the patient at his request.      Marylin Crosby, LPN  [phone]

## 2019-03-27 NOTE — Patient Instructions (Signed)
Preventive Care 83 Years and Older, Male Preventive care refers to lifestyle choices and visits with your health care provider that can promote health and wellness. This includes:  A yearly physical exam. This is also called an annual well check.  Regular dental and eye exams.  Immunizations.  Screening for certain conditions.  Healthy lifestyle choices, such as diet and exercise. What can I expect for my preventive care visit? Physical exam Your health care provider will check:  Height and weight. These may be used to calculate body mass index (BMI), which is a measurement that tells if you are at a healthy weight.  Heart rate and blood pressure.  Your skin for abnormal spots. Counseling Your health care provider may ask you questions about:  Alcohol, tobacco, and drug use.  Emotional well-being.  Home and relationship well-being.  Sexual activity.  Eating habits.  History of falls.  Memory and ability to understand (cognition).  Work and work Statistician. What immunizations do I need?  Influenza (flu) vaccine  This is recommended every year. Tetanus, diphtheria, and pertussis (Tdap) vaccine  You may need a Td booster every 10 years. Varicella (chickenpox) vaccine  You may need this vaccine if you have not already been vaccinated. Zoster (shingles) vaccine  You may need this after age 50. Pneumococcal conjugate (PCV13) vaccine  One dose is recommended after age 24. Pneumococcal polysaccharide (PPSV23) vaccine  One dose is recommended after age 33. Measles, mumps, and rubella (MMR) vaccine  You may need at least one dose of MMR if you were born in 1957 or later. You may also need a second dose. Meningococcal conjugate (MenACWY) vaccine  You may need this if you have certain conditions. Hepatitis A vaccine  You may need this if you have certain conditions or if you travel or work in places where you may be exposed to hepatitis A. Hepatitis B vaccine   You may need this if you have certain conditions or if you travel or work in places where you may be exposed to hepatitis B. Haemophilus influenzae type b (Hib) vaccine  You may need this if you have certain conditions. You may receive vaccines as individual doses or as more than one vaccine together in one shot (combination vaccines). Talk with your health care provider about the risks and benefits of combination vaccines. What tests do I need? Blood tests  Lipid and cholesterol levels. These may be checked every 5 years, or more frequently depending on your overall health.  Hepatitis C test.  Hepatitis B test. Screening  Lung cancer screening. You may have this screening every year starting at age 83 if you have a 30-pack-year history of smoking and currently smoke or have quit within the past 15 years.  Colorectal cancer screening. All adults should have this screening starting at age 83 and continuing until age 54. Your health care provider may recommend screening at age 83 if you are at increased risk. You will have tests every 1-10 years, depending on your results and the type of screening test.  Prostate cancer screening. Recommendations will vary depending on your family history and other risks.  Diabetes screening. This is done by checking your blood sugar (glucose) after you have not eaten for a while (fasting). You may have this done every 1-3 years.  Abdominal aortic aneurysm (AAA) screening. You may need this if you are a current or former smoker.  Sexually transmitted disease (STD) testing. Follow these instructions at home: Eating and drinking  Eat  a diet that includes fresh fruits and vegetables, whole grains, lean protein, and low-fat dairy products. Limit your intake of foods with high amounts of sugar, saturated fats, and salt.  Take vitamin and mineral supplements as recommended by your health care provider.  Do not drink alcohol if your health care provider  tells you not to drink.  If you drink alcohol: ? Limit how much you have to 0-2 drinks a day. ? Be aware of how much alcohol is in your drink. In the [place] one drink equals one 12 oz bottle of beer (355 mL), one 5 oz glass of wine (148 mL), or one 1 oz glass of hard liquor (44 mL). Lifestyle  Take daily care of your teeth and gums.  Stay active. Exercise for at least 30 minutes on 5 or more days each week.  Do not use any products that contain nicotine or tobacco, such as cigarettes, e-cigarettes, and chewing tobacco. If you need help quitting, ask your health care provider.  If you are sexually active, practice safe sex. Use a condom or other form of protection to prevent STIs (sexually transmitted infections).  Talk with your health care provider about taking a low-dose aspirin or statin. What's next?  Visit your health care provider once a year for a well check visit.  Ask your health care provider how often you should have your eyes and teeth checked.  Stay up to date on all vaccines. This information is not intended to replace advice given to you by your health care provider. Make sure you discuss any questions you have with your health care provider. Document Released: 09/13/2015 Document Revised: 08/11/2018 Document Reviewed: 08/11/2018 Elsevier Patient Education  2020 Elsevier Inc.  

## 2019-03-30 ENCOUNTER — Other Ambulatory Visit: Payer: Self-pay

## 2019-04-03 ENCOUNTER — Ambulatory Visit: Payer: Medicare Other | Admitting: Pharmacist

## 2019-04-03 ENCOUNTER — Ambulatory Visit: Payer: Self-pay | Admitting: Pharmacist

## 2019-04-03 DIAGNOSIS — Y998 Other external cause status: Secondary | ICD-10-CM | POA: Diagnosis not present

## 2019-04-03 DIAGNOSIS — S199XXA Unspecified injury of neck, initial encounter: Secondary | ICD-10-CM | POA: Diagnosis not present

## 2019-04-03 DIAGNOSIS — Z043 Encounter for examination and observation following other accident: Secondary | ICD-10-CM | POA: Diagnosis not present

## 2019-04-03 DIAGNOSIS — Y92410 Unspecified street and highway as the place of occurrence of the external cause: Secondary | ICD-10-CM | POA: Diagnosis not present

## 2019-04-03 DIAGNOSIS — M79631 Pain in right forearm: Secondary | ICD-10-CM | POA: Diagnosis not present

## 2019-04-03 DIAGNOSIS — S7001XA Contusion of right hip, initial encounter: Secondary | ICD-10-CM | POA: Diagnosis not present

## 2019-04-03 DIAGNOSIS — Z79899 Other long term (current) drug therapy: Secondary | ICD-10-CM | POA: Diagnosis not present

## 2019-04-03 DIAGNOSIS — W19XXXA Unspecified fall, initial encounter: Secondary | ICD-10-CM | POA: Diagnosis not present

## 2019-04-03 DIAGNOSIS — I498 Other specified cardiac arrhythmias: Secondary | ICD-10-CM | POA: Diagnosis not present

## 2019-04-03 DIAGNOSIS — I447 Left bundle-branch block, unspecified: Secondary | ICD-10-CM | POA: Diagnosis not present

## 2019-04-03 DIAGNOSIS — M79621 Pain in right upper arm: Secondary | ICD-10-CM | POA: Diagnosis not present

## 2019-04-03 DIAGNOSIS — S298XXA Other specified injuries of thorax, initial encounter: Secondary | ICD-10-CM | POA: Diagnosis not present

## 2019-04-03 DIAGNOSIS — E785 Hyperlipidemia, unspecified: Secondary | ICD-10-CM | POA: Diagnosis not present

## 2019-04-03 DIAGNOSIS — S3991XA Unspecified injury of abdomen, initial encounter: Secondary | ICD-10-CM | POA: Diagnosis not present

## 2019-04-03 DIAGNOSIS — M25572 Pain in left ankle and joints of left foot: Secondary | ICD-10-CM | POA: Diagnosis not present

## 2019-04-03 DIAGNOSIS — M25521 Pain in right elbow: Secondary | ICD-10-CM | POA: Diagnosis not present

## 2019-04-03 DIAGNOSIS — S80812A Abrasion, left lower leg, initial encounter: Secondary | ICD-10-CM | POA: Diagnosis not present

## 2019-04-03 DIAGNOSIS — S01312A Laceration without foreign body of left ear, initial encounter: Secondary | ICD-10-CM | POA: Diagnosis not present

## 2019-04-03 DIAGNOSIS — I959 Hypotension, unspecified: Secondary | ICD-10-CM | POA: Diagnosis not present

## 2019-04-03 DIAGNOSIS — G4733 Obstructive sleep apnea (adult) (pediatric): Secondary | ICD-10-CM | POA: Diagnosis not present

## 2019-04-03 DIAGNOSIS — M79662 Pain in left lower leg: Secondary | ICD-10-CM | POA: Diagnosis not present

## 2019-04-03 DIAGNOSIS — S5001XA Contusion of right elbow, initial encounter: Secondary | ICD-10-CM | POA: Diagnosis not present

## 2019-04-03 DIAGNOSIS — I951 Orthostatic hypotension: Secondary | ICD-10-CM | POA: Diagnosis not present

## 2019-04-03 DIAGNOSIS — I700 Atherosclerosis of aorta: Secondary | ICD-10-CM | POA: Diagnosis not present

## 2019-04-03 DIAGNOSIS — M79652 Pain in left thigh: Secondary | ICD-10-CM | POA: Diagnosis not present

## 2019-04-03 DIAGNOSIS — S0990XA Unspecified injury of head, initial encounter: Secondary | ICD-10-CM | POA: Diagnosis not present

## 2019-04-03 DIAGNOSIS — M25562 Pain in left knee: Secondary | ICD-10-CM | POA: Diagnosis not present

## 2019-04-03 DIAGNOSIS — I129 Hypertensive chronic kidney disease with stage 1 through stage 4 chronic kidney disease, or unspecified chronic kidney disease: Secondary | ICD-10-CM | POA: Diagnosis not present

## 2019-04-03 DIAGNOSIS — I10 Essential (primary) hypertension: Secondary | ICD-10-CM | POA: Diagnosis not present

## 2019-04-03 DIAGNOSIS — Z794 Long term (current) use of insulin: Secondary | ICD-10-CM | POA: Diagnosis not present

## 2019-04-03 DIAGNOSIS — N184 Chronic kidney disease, stage 4 (severe): Secondary | ICD-10-CM | POA: Diagnosis not present

## 2019-04-03 DIAGNOSIS — M8580 Other specified disorders of bone density and structure, unspecified site: Secondary | ICD-10-CM | POA: Diagnosis not present

## 2019-04-04 DIAGNOSIS — I951 Orthostatic hypotension: Secondary | ICD-10-CM | POA: Diagnosis not present

## 2019-04-04 DIAGNOSIS — E872 Acidosis: Secondary | ICD-10-CM | POA: Diagnosis not present

## 2019-04-04 DIAGNOSIS — T1490XA Injury, unspecified, initial encounter: Secondary | ICD-10-CM | POA: Diagnosis not present

## 2019-04-04 DIAGNOSIS — M25512 Pain in left shoulder: Secondary | ICD-10-CM | POA: Diagnosis not present

## 2019-04-04 DIAGNOSIS — N184 Chronic kidney disease, stage 4 (severe): Secondary | ICD-10-CM | POA: Diagnosis not present

## 2019-04-04 DIAGNOSIS — W19XXXA Unspecified fall, initial encounter: Secondary | ICD-10-CM | POA: Diagnosis not present

## 2019-04-05 ENCOUNTER — Telehealth: Payer: Self-pay | Admitting: Family Medicine

## 2019-04-05 DIAGNOSIS — W19XXXA Unspecified fall, initial encounter: Secondary | ICD-10-CM | POA: Diagnosis not present

## 2019-04-05 DIAGNOSIS — I951 Orthostatic hypotension: Secondary | ICD-10-CM | POA: Diagnosis not present

## 2019-04-05 DIAGNOSIS — N184 Chronic kidney disease, stage 4 (severe): Secondary | ICD-10-CM | POA: Diagnosis not present

## 2019-04-05 DIAGNOSIS — K118 Other diseases of salivary glands: Secondary | ICD-10-CM

## 2019-04-05 DIAGNOSIS — E872 Acidosis: Secondary | ICD-10-CM | POA: Diagnosis not present

## 2019-04-05 MED ORDER — NITROGLYCERIN 0.4 MG SL SUBL
.40 | SUBLINGUAL_TABLET | SUBLINGUAL | Status: DC
Start: ? — End: 2019-04-05

## 2019-04-05 MED ORDER — LIDOCAINE 4 % EX PTCH
1.00 | MEDICATED_PATCH | CUTANEOUS | Status: DC
Start: 2019-04-05 — End: 2019-04-05

## 2019-04-05 MED ORDER — GENERIC EXTERNAL MEDICATION
2.00 | Status: DC
Start: 2019-04-06 — End: 2019-04-05

## 2019-04-05 MED ORDER — GLUCOSE 40 % PO GEL
15.00 | ORAL | Status: DC
Start: ? — End: 2019-04-05

## 2019-04-05 MED ORDER — ACETAMINOPHEN 325 MG PO TABS
650.00 | ORAL_TABLET | ORAL | Status: DC
Start: ? — End: 2019-04-05

## 2019-04-05 MED ORDER — CLONIDINE HCL 0.1 MG PO TABS
.10 | ORAL_TABLET | ORAL | Status: DC
Start: 2019-04-05 — End: 2019-04-05

## 2019-04-05 MED ORDER — FUROSEMIDE 40 MG PO TABS
40.00 | ORAL_TABLET | ORAL | Status: DC
Start: 2019-04-06 — End: 2019-04-05

## 2019-04-05 MED ORDER — CLOPIDOGREL BISULFATE 75 MG PO TABS
75.00 | ORAL_TABLET | ORAL | Status: DC
Start: 2019-04-06 — End: 2019-04-05

## 2019-04-05 MED ORDER — DEXTROSE 10 % IV SOLN
125.00 | INTRAVENOUS | Status: DC
Start: ? — End: 2019-04-05

## 2019-04-05 MED ORDER — INSULIN LISPRO 100 UNIT/ML ~~LOC~~ SOLN
1.00 | SUBCUTANEOUS | Status: DC
Start: 2019-04-05 — End: 2019-04-05

## 2019-04-05 MED ORDER — OXYCODONE-ACETAMINOPHEN 5-325 MG PO TABS
1.00 | ORAL_TABLET | ORAL | Status: DC
Start: ? — End: 2019-04-05

## 2019-04-05 MED ORDER — SODIUM CHLORIDE 0.9 % IV SOLN
INTRAVENOUS | Status: DC
Start: ? — End: 2019-04-05

## 2019-04-05 MED ORDER — METOPROLOL SUCCINATE ER 25 MG PO TB24
50.00 | ORAL_TABLET | ORAL | Status: DC
Start: 2019-04-06 — End: 2019-04-05

## 2019-04-05 NOTE — Telephone Encounter (Signed)
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

## 2019-04-07 ENCOUNTER — Ambulatory Visit: Payer: Self-pay | Admitting: Pharmacist

## 2019-04-07 ENCOUNTER — Other Ambulatory Visit: Payer: Self-pay | Admitting: Pharmacist

## 2019-04-07 NOTE — Patient Outreach (Signed)
[facility] [facility]) [place]  04/07/2019  Manuel Kline 12-29-1931 [id number]   Reason for referral: Medication Assistance, Medication Management  Referral source: [facility] RN Current insurance: [facility]  Outreach:  Unsuccessful telephone call attempt #3 to patient.   HIPAA compliant voicemail left requesting a return call  Plan:  -I will close [name] case at this time as I have been unable to establish and/or maintain contact with patient.    Regina Eck, PharmD, [place]  [phone]

## 2019-04-10 NOTE — Patient Outreach (Signed)
[place] [facility]) [place]  04/10/2019  Manuel Kline September 02, 1931 [id number]  Reason for referral: medication management/assistance  Successful call to patient's wife, Manuel Kline and Manuel Kline to confirm new insulin dosing/shipment.  Patient assistance program has provided patient with a new insulin regimen.  Per Dr. Warrick Parisian, patient will transition to Basaglar (insulin glargine) 25 units sq at bedtime and Humulin R 30 units twice daily with meals (for now).  He will discontinue Humulin N.   Due to recent trauma/stress, patient's BGs have been >200.  Patient has a follow up this month with PCP where this will be addressed.  Prior to accident, patient's BGs were in the 100-200 depending on diet. Counseled patient on diet.  Patient has received new insulin shipment.  They verbalize understanding on new plan and diabetes goals.  Encouraged patient & wife to call if any issues arise.  PLAN: -I will follow up in 3 months regarding diabetes.   Regina Eck, PharmD, [place]  [phone]

## 2019-04-11 DIAGNOSIS — C44221 Squamous cell carcinoma of skin of unspecified ear and external auricular canal: Secondary | ICD-10-CM | POA: Diagnosis not present

## 2019-04-11 DIAGNOSIS — L281 Prurigo nodularis: Secondary | ICD-10-CM | POA: Diagnosis not present

## 2019-04-11 DIAGNOSIS — L218 Other seborrheic dermatitis: Secondary | ICD-10-CM | POA: Diagnosis not present

## 2019-04-13 ENCOUNTER — Other Ambulatory Visit: Payer: Self-pay

## 2019-04-14 ENCOUNTER — Other Ambulatory Visit: Payer: Self-pay

## 2019-04-14 ENCOUNTER — Encounter: Payer: Self-pay | Admitting: Family

## 2019-04-14 ENCOUNTER — Ambulatory Visit (INDEPENDENT_AMBULATORY_CARE_PROVIDER_SITE_OTHER): Payer: Medicare Other | Admitting: Family

## 2019-04-14 VITALS — BP 129/61 | HR 62 | Temp 96.2°F | Ht 65.0 in | Wt 227.6 lb

## 2019-04-14 DIAGNOSIS — N184 Chronic kidney disease, stage 4 (severe): Secondary | ICD-10-CM | POA: Diagnosis not present

## 2019-04-14 DIAGNOSIS — M7989 Other specified soft tissue disorders: Secondary | ICD-10-CM | POA: Diagnosis not present

## 2019-04-14 DIAGNOSIS — Z09 Encounter for follow-up examination after completed treatment for conditions other than malignant neoplasm: Secondary | ICD-10-CM

## 2019-04-14 DIAGNOSIS — S7011XA Contusion of right thigh, initial encounter: Secondary | ICD-10-CM

## 2019-04-14 DIAGNOSIS — I10 Essential (primary) hypertension: Secondary | ICD-10-CM

## 2019-04-14 DIAGNOSIS — T148XXA Other injury of unspecified body region, initial encounter: Secondary | ICD-10-CM

## 2019-04-14 NOTE — Progress Notes (Signed)
Subjective:    Patient ID: Manuel Kline, male    DOB: [birth year], 85 y.o.   MRN: [id number]  Chief Complaint  Patient presents with  . Hospitalization Follow-up     HPI Pt presents to the office today for hospital follow up. Pt went to the ED on 04/03/19 after he fell about 8 feet from a tractor accident.   He had left shoulder x-ray. Left knee x-ray, left ankle, left tibia/fibula , left thigh/femur,  Right forearm, right humerus, right elbow x-ray that were negative for fracture or acute injury.   Had CT thoracic & Lumbar, Ct abdomen, ct cervical, and CT head that were negative for fracture. A moderate hematoma was noted on right hip.   An accidental finding of "Multiple soft tissue nodules are present in the superficial and deep aspects of the left parotid gland and are of varying density, measuring up to 2 cm in size. The relatively low density lesion in the superficial parotid gland could be partially necrotic. Differential primarily includes benign and malignant primary parotid neoplasms versus abnormal enlarged lymph nodes. Additional imaging will not provide greater specificity and ENT consultation is recommended for further evaluation/management recommendations."  He has an ENT appointment next Wednesday.   When he presents to the hospital he was hypotensive. His Quinapril was stopped and Lasix was held for four days, but has resumed his lasix. His BP is stable today.    Review of Systems  Skin:       Ecchymosis and hematoma present on right hip  All other systems reviewed and are negative.      Objective:   Physical Exam Vitals signs reviewed.  Constitutional:      General: He is not in acute distress.    Appearance: He is well-developed.  HENT:     Head: Normocephalic.     Right Ear: Tympanic membrane normal.     Left Ear: Tympanic membrane normal.  Eyes:     General:        Right eye: No discharge.        Left eye: No discharge.     Pupils: Pupils are equal,  round, and reactive to light.  Neck:     Musculoskeletal: Normal range of motion and neck supple.     Thyroid: No thyromegaly.  Cardiovascular:     Rate and Rhythm: Normal rate and regular rhythm.     Heart sounds: Normal heart sounds. No murmur.  Pulmonary:     Effort: Pulmonary effort is normal. No respiratory distress.     Breath sounds: Normal breath sounds. No wheezing.  Abdominal:     General: Bowel sounds are normal. There is no distension.     Palpations: Abdomen is soft.     Tenderness: There is no abdominal tenderness.  Musculoskeletal: Normal range of motion.        General: No tenderness.  Skin:    General: Skin is warm and dry.     Findings: Bruising present. No erythema or rash.          Comments: Large yellow, purple hematoma that is tender  Neurological:     Mental Status: He is alert and oriented to person, place, and time.     Cranial Nerves: No cranial nerve deficit.     Deep Tendon Reflexes: Reflexes are normal and symmetric.  Psychiatric:        Behavior: Behavior normal.        Thought Content: Thought content normal.  Judgment: Judgment normal.       BP 129/61   Pulse 62   Temp (!) 96.2 F (35.7 C) (Oral)   Ht 5' 5"  (1.651 m)   Wt 227 lb 9.6 oz (103.2 kg)   BMI 37.87 kg/m      Assessment & Plan:  Manuel Kline comes in today with chief complaint of Hospitalization Follow-up   Diagnosis and orders addressed:  1. Hospital discharge follow-up - CMP14+EGFR - CBC with Differential/Platelet  2. CKD (chronic kidney disease) stage 4, GFR 15-29 ml/min (HCC) - CMP14+EGFR - CBC with Differential/Platelet  3. Morbid obesity ([place]) - CMP14+EGFR - CBC with Differential/Platelet  4. Nodule of soft tissue Keep ENT appt - CMP14+EGFR - CBC with Differential/Platelet  5. Essential hypertension BP is at goal today, will continue to hold Quinapril  Continue lasix- weigh yourself daily Keep follow up appt with PCP  6. Hematoma Still large  Ice Rest Try to sit and lay constantly on area   Labs pending Health Maintenance reviewed Diet and exercise encouraged  Follow up plan: Keep chronic follow up appt with PCP and specialists    Evelina Dun, FNP

## 2019-04-14 NOTE — Patient Instructions (Signed)
Hematoma A hematoma is a collection of blood under the skin, in an organ, in a body space, in a joint space, or in other tissue. The blood can thicken (clot) to form a lump that you can see and feel. The lump is often firm and may become sore and tender. Most hematomas get better in a few days to weeks. However, some hematomas may be serious and require medical care. Hematomas can range from very small to very large. What are the causes? This condition is caused by:  A blunt or penetrating injury.  A leakage from a blood vessel under the skin.  Some medical procedures, including surgeries, such as oral surgery, face lifts, and surgeries on the joints.  Some medical conditions that cause bleeding or bruising. There may be multiple hematomas that appear in different areas of the body. What increases the risk? You are more likely to develop this condition if:  You are an older adult.  You use blood thinners. What are the signs or symptoms?  Symptoms of this condition depend on where the hematoma is located.  Common symptoms of a hematoma that is under the skin include:  A firm lump on the body.  Pain and tenderness in the area.  Bruising. Blue, dark blue, purple-red, or yellowish skin (discoloration) may appear at the site of the hematoma if the hematoma is close to the surface of the skin. Common symptoms of a hematoma that is deep in the tissues or body spaces may be less obvious. They include:  A collection of blood in the stomach (intra-abdominal hematoma). This may cause pain in the abdomen, weakness, fainting, and shortness of breath.  A collection of blood in the head (intracranial hematoma). This may cause a headache or symptoms such as weakness, trouble speaking or understanding, or a change in consciousness. How is this diagnosed? This condition is diagnosed based on:  Your medical history.  A physical exam.  Imaging tests, such as an ultrasound or CT scan. These may  be needed if your health care provider suspects a hematoma in deeper tissues or body spaces.  Blood tests. These may be needed if your health care provider believes that the hematoma is caused by a medical condition. How is this treated? Treatment for this condition depends on the cause, size, and location of the hematoma. Treatment may include:  Doing nothing. The majority of hematomas do not need treatment as many of them go away on their own over time.  Surgery or close monitoring. This may be needed for large hematomas or hematomas that affect vital organs.  Medicines. Medicines may be given if there is an underlying medical cause for the hematoma. Follow these instructions at home: Managing pain, stiffness, and swelling   If directed, put ice on the affected area. ? Put ice in a plastic bag. ? Place a towel between your skin and the bag. ? Leave the ice on for 20 minutes, 2-3 times a day for the first couple of days.  If directed, apply heat to the affected area after applying ice for a couple of days. Use the heat source that your health care provider recommends, such as a moist heat pack or a heating pad. ? Place a towel between your skin and the heat source. ? Leave the heat on for 20-30 minutes. ? Remove the heat if your skin turns bright red. This is especially important if you are unable to feel pain, heat, or cold. You may have a greater   risk of getting burned.  Raise (elevate) the affected area above the level of your heart while you are sitting or lying down.  If told, wrap the affected area with an elastic bandage. The bandage applies pressure (compression) to the area, which may help to reduce swelling and promote healing. Do not wrap the bandage too tightly around the affected area.  If your hematoma is on a leg or foot (lower extremity) and is painful, your health care provider may recommend crutches. Use them as told by your health care provider. General instructions   Take over-the-counter and prescription medicines only as told by your health care provider.  Keep all follow-up visits as told by your health care provider. This is important. Contact a health care provider if:  You have a fever.  The swelling or discoloration gets worse.  You develop more hematomas. Get help right away if:  Your pain is worse or your pain is not controlled with medicine.  Your skin over the hematoma breaks or starts bleeding.  Your hematoma is in your chest or abdomen and you have weakness, shortness of breath, or a change in consciousness.  You have a hematoma on your scalp that is caused by a fall or injury, and you also have: ? A headache that gets worse. ? Trouble speaking or understanding speech. ? Weakness. ? Change in alertness or consciousness. Summary  A hematoma is a collection of blood under the skin, in an organ, in a body space, in a joint space, or in other tissue.  This condition usually does not need treatment because many hematomas go away on their own over time.  Large hematomas, or those that may affect vital organs, may need surgical drainage or monitoring. If the hematoma is caused by a medical condition, medicines may be prescribed.  Get help right away if your hematoma breaks or starts to bleed, you have shortness of breath, or you have a headache or trouble speaking after a fall. This information is not intended to replace advice given to you by your health care provider. Make sure you discuss any questions you have with your health care provider. Document Released: 03/31/2004 Document Revised: 01/20/2018 Document Reviewed: 01/20/2018 Elsevier Patient Education  2020 Elsevier Inc.  

## 2019-04-15 LAB — CBC WITH DIFFERENTIAL/PLATELET
Basophils Absolute: 0 10*3/uL (ref 0.0–0.2)
Basos: 0 %
EOS (ABSOLUTE): 0.3 10*3/uL (ref 0.0–0.4)
Eos: 3 %
Hematocrit: 35.6 % — ABNORMAL LOW (ref 37.5–51.0)
Hemoglobin: 12.6 g/dL — ABNORMAL LOW (ref 13.0–17.7)
Immature Grans (Abs): 0 10*3/uL (ref 0.0–0.1)
Immature Granulocytes: 0 %
Lymphocytes Absolute: 2.4 10*3/uL (ref 0.7–3.1)
Lymphs: 26 %
MCH: 31.4 pg (ref 26.6–33.0)
MCHC: 35.4 g/dL (ref 31.5–35.7)
MCV: 89 fL (ref 79–97)
Monocytes Absolute: 0.7 10*3/uL (ref 0.1–0.9)
Monocytes: 7 %
Neutrophils Absolute: 5.7 10*3/uL (ref 1.4–7.0)
Neutrophils: 64 %
Platelets: 277 10*3/uL (ref 150–450)
RBC: 4.01 x10E6/uL — ABNORMAL LOW (ref 4.14–5.80)
RDW: 14.1 % (ref 11.6–15.4)
WBC: 9.1 10*3/uL (ref 3.4–10.8)

## 2019-04-15 LAB — CMP14+EGFR
ALT: 20 IU/L (ref 0–44)
AST: 22 IU/L (ref 0–40)
Albumin/Globulin Ratio: 1.8 (ref 1.2–2.2)
Albumin: 4.3 g/dL (ref 3.6–4.6)
Alkaline Phosphatase: 130 IU/L — ABNORMAL HIGH (ref 39–117)
BUN/Creatinine Ratio: 20 (ref 10–24)
BUN: 43 mg/dL — ABNORMAL HIGH (ref 8–27)
Bilirubin Total: 1.1 mg/dL (ref 0.0–1.2)
CO2: 22 mmol/L (ref 20–29)
Calcium: 9.8 mg/dL (ref 8.6–10.2)
Chloride: 93 mmol/L — ABNORMAL LOW (ref 96–106)
Creatinine, Ser: 2.11 mg/dL — ABNORMAL HIGH (ref 0.76–1.27)
GFR calc Af Amer: 32 mL/min/{1.73_m2} — ABNORMAL LOW (ref >59)
GFR calc non Af Amer: 28 mL/min/{1.73_m2} — ABNORMAL LOW (ref >59)
Globulin, Total: 2.4 g/dL (ref 1.5–4.5)
Glucose: 236 mg/dL — ABNORMAL HIGH (ref 65–99)
Potassium: 4.6 mmol/L (ref 3.5–5.2)
Sodium: 135 mmol/L (ref 134–144)
Total Protein: 6.7 g/dL (ref 6.0–8.5)

## 2019-04-19 DIAGNOSIS — K118 Other diseases of salivary glands: Secondary | ICD-10-CM | POA: Diagnosis not present

## 2019-04-19 DIAGNOSIS — C4490 Unspecified malignant neoplasm of skin, unspecified: Secondary | ICD-10-CM | POA: Diagnosis not present

## 2019-04-20 ENCOUNTER — Other Ambulatory Visit: Payer: Self-pay

## 2019-04-20 NOTE — Patient Outreach (Signed)
[facility]) Care Management  04/20/2019   Manuel Kline 01-31-1932 [id number]      Outreach attempt # 1 to the patient for initial assessment.  HIPAA provided by the patient.  He and his wife Manuel Kline per verbal consent  completed the assessment.Marland Kitchen  He usually puts the phone on speaker so he and his wife can answer questions.  Social: The patient lives in the home with his wife.  He states that he is independent with ADLS and needs some assistants with his IADLS. The patient states that he is sore. The patient had a fall  On 04/03/19.  He states that his tractor started to fall and he had to jump off landing in a pile.  He bruised his buttocks and he twisted his knee and ankle.  He had another fall this week when he put his walker behind his chair and lost his balance trying to go around to sit in the chair.  Fall precautions were discussed.  He verbalized understanding.  He is unable to drive at this time but has family that will take him to  appointments.  He states that he has a wheelchair, walker, cane, CBG meter, blood pressure cuff, scale,  grab bars at the toilet and in the shower, life alert necklace, glasses, and a hearing aid in the home.    Conditions:  Per chart review and speaking with the patient his PMH consists of:  DM type II, HTN, CHF, Obesity, CAD, OSA,, Neuropathy, CKD, Gout,  H/O TIA and GERD. Per patient's chart patient's last A1C was 7.3 on 01/19/19.  He states that his diabetes is monitored by his primary care. The patient states that he checks his blood sugar and weight daily.  His blood sugar yesterday fasting was 129 today 170.  His weight today in his shorts was 220 lbs.  The patient states that his doctor would like for him to progress under 200 lbs.The patient reports that he tries to follow a low salt low, carb diet.  He is unable to exercise right now due to his fall.  Medications: Per chart review and speaking with his wife he is on fourteen medications.   [name] helps him with his medications.  The patient recently received help with this insulin from [facility]  He has received it and taking it.  Appointments: The patient was recently seen at his PCP office on 04/14/2019.  The patient is waiting on an appointment for the ENT to have a needle aspiration on a mass behind his left ear.  Advanced Directives: The patient states that he has a healthcare power of attorney and a living will.   Current Medications:  Current Outpatient Medications  Medication Sig Dispense Refill  . allopurinol (ZYLOPRIM) 300 MG tablet TAKE 1/2 (ONE-HALF) TABLET BY MOUTH ONCE DAILY 45 tablet 1  . atorvastatin (LIPITOR) 80 MG tablet Take 0.5 tablets (40 mg total) by mouth daily. 45 tablet 5  . cloNIDine (CATAPRES) 0.1 MG tablet Take 1 tablet (0.1 mg total) by mouth 2 (two) times daily. 180 tablet 3  . clopidogrel (PLAVIX) 75 MG tablet Take 1 tablet (75 mg total) by mouth daily. 90 tablet 3  . Cyanocobalamin (VITAMIN B 12 PO) Take 1,000 mcg by mouth daily.    . DULoxetine (CYMBALTA) 30 MG capsule Take 1 capsule (30 mg total) by mouth daily. 90 capsule 3  . furosemide (LASIX) 40 MG tablet Take 1 tablet (40 mg total) by mouth 2 (two) times daily.  180 tablet 3  . gabapentin (NEURONTIN) 300 MG capsule Take 1 capsule (300 mg total) by mouth 2 (two) times daily. 180 capsule 3  . glucose blood (ACCU-CHEK GUIDE) test strip 1 each by Other route 5 (five) times daily. Use as instructed 400 each 11  . Insulin Glargine (BASAGLAR KWIKPEN) 100 UNIT/ML SOPN Inject 0.25 mLs (25 Units total) into the skin daily.    . Insulin Pen Needle 29G X 12.7MM MISC Use ti inject insulin 5 times a day 150 each 3  . insulin regular (HUMULIN R) 100 units/mL injection Inject 0.3 mLs (30 Units total) into the skin 2 (two) times daily before a meal. 60 mL 3  . metoprolol succinate (TOPROL-XL) 50 MG 24 hr tablet Take 1 tablet (50 mg total) by mouth daily. 90 tablet 3  . nitroGLYCERIN (NITROSTAT) 0.4 MG SL tablet  Place 0.4 mg under the tongue every 5 (five) minutes as needed for chest pain.    . polyethylene glycol powder (GLYCOLAX/MIRALAX) powder Take 17 g by mouth 2 (two) times daily as needed. 3350 g 1  . Omega-3 Fatty Acids (FISH OIL) 1000 MG CPDR Take 1 tablet by mouth daily.    . quinapril (ACCUPRIL) 40 MG tablet TAKE 1 TABLET BY MOUTH AT BEDTIME (Patient not taking: No sig reported) 90 tablet 1   No current facility-administered medications for this visit.     Functional Status:  In your present state of health, do you have any difficulty performing the following activities: 04/20/2019 03/27/2019  Hearing? [name]  Comment wears a hearing aid wears hearing aids  Vision? N N  Difficulty concentrating or making decisions? N N  Walking or climbing stairs? Y N  Comment HUrt his ankle and balance -  Dressing or bathing? N N  Doing errands, shopping? N N  Preparing Food and eating ? Y N  Comment Wife and son help with meals -  Using the Toilet? N N  In the past six months, have you accidently leaked urine? N N  Do you have problems with loss of bowel control? N N  Managing your Medications? Y N  Comment Wife handles meds -  Managing your Finances? Y N  Comment HIs children help manage finances -  Housekeeping or managing your Housekeeping? Y N  Comment Wife and grandchildren help -  Some recent data might be hidden    Fall/Depression Screening: Fall Risk  04/20/2019 04/14/2019 03/27/2019  Falls in the past year? 1 0 0  Number falls in past yr: 1 - -  Injury with Fall? 1 - -  Comment Patient fell from a tractor and had bruisees on buttocks and back of left leg. - -  Risk Factor Category  - - -  Risk for fall due to : Impaired balance/gait;Other (Comment) - -  Risk for fall due to: Comment Tractor turned over and he jumped off and then lost his balance - -  Follow up Falls evaluation completed;Education provided - -  Comment - - -   PHQ 2/9 Scores 04/20/2019 04/14/2019 03/27/2019 01/19/2019  12/29/2018 12/29/2018 10/20/2018  PHQ - 2 Score 0 0 0 0 0 0 0  PHQ- 9 Score - - - - - - 0    Assessment: Patient will benefit from health coach outreach for disease management and support.  THN CM Care Plan Problem One     Most Recent Value  Care Plan Problem One  Knowledge deficit related to DM, CHF  Role Documenting the  Problem One  [place] for Problem One  Active  THN Long Term Goal   Pt will verbalize/ demonstrate improved self care related to DM, CHF in 30 days  THN Long Term Goal Start Date  04/20/19  Interventions for Problem One Long Term Goal  Reviewed  FBS,  reviewed diet and healthy food choices, encouraged continued daily blood sugar checks and weights, encouraged continuedmedication adherence, continued using walker with ambulation encouraged patient to keep appointments.,     Plan: RN Health Coach will provide ongoing education for patient on diabetes through phone calls and sending printed information to patient for further discussion.    RN Health Coach will send Booklet on diabetes.  RN Health Coach will send initial barriers letter, assessment, and care plan to primary care physician.    [place] will send Consent form.  Will follow up next month to make sure the patient received the educational material.  Roberta will contact patient in the month of September and patient agrees to next outreach.   [name] RN, BSN, [place] Direct Dial:  [phone]  Fax: [phone]

## 2019-04-25 ENCOUNTER — Telehealth: Payer: Self-pay | Admitting: Neurology

## 2019-04-25 ENCOUNTER — Other Ambulatory Visit: Payer: Self-pay

## 2019-04-25 NOTE — Telephone Encounter (Signed)
Patient daughter in law called stating the patient is going to have a needle biopsy on 05/10/19 and they want to know if he needs to hold his plavik for 5 days. She stated you can call their house phone and talk to them.

## 2019-04-25 NOTE — Patient Outreach (Signed)
[facility]) Care Management  04/25/2019  Manuel Kline 07-26-1932 [id number]   Medication Adherence call to [name] [place] Identifiers Verify Manuel Kline is showing past due on Atorvastatin 80 mg and Quinapril 40 mg patient explain he takes both medication patient has been in the hospital patient has medications at this time.Manuel Kline is past due under [place].   [place] [facility] Direct Dial [phone]  Fax [phone] [email or web address]

## 2019-04-25 NOTE — Telephone Encounter (Signed)
If daughter in law calls back she is not on any dpr form to speak with. I can only talk to the wife or pt only. We cannot speak with daughter in law because she is not on the dpr for out office.  I spoke with pts wife and I explain the MD who is doing the biopsy will need to contact our office about the procedure if plavix needs to be stop. I advise wife to have the RN or clearance staff to fax over a form or give our office a call. The wife verbalized understanding.

## 2019-04-26 ENCOUNTER — Ambulatory Visit (INDEPENDENT_AMBULATORY_CARE_PROVIDER_SITE_OTHER): Payer: Medicare Other | Admitting: *Deleted

## 2019-04-26 DIAGNOSIS — I255 Ischemic cardiomyopathy: Secondary | ICD-10-CM

## 2019-04-26 LAB — CUP PACEART REMOTE DEVICE CHECK
Battery Remaining Longevity: 77 mo
Battery Voltage: 3.01 V
Brady Statistic AP VP Percent: 0.12 %
Brady Statistic AP VS Percent: 86.33 %
Brady Statistic AS VP Percent: 0.01 %
Brady Statistic AS VS Percent: 13.54 %
Brady Statistic RA Percent Paced: 86.43 %
Brady Statistic RV Percent Paced: 0.12 %
Date Time Interrogation Session: 20200826084003
HighPow Impedance: 51 Ohm
HighPow Impedance: 65 Ohm
Implantable Lead Implant Date: 20030519
Implantable Lead Implant Date: 20030519
Implantable Lead Location: 753859
Implantable Lead Location: 753860
Implantable Lead Model: 158
Implantable Lead Model: 4087
Implantable Lead Serial Number: 115102
Implantable Lead Serial Number: 159999
Implantable Pulse Generator Implant Date: 20171114
Lead Channel Impedance Value: 342 Ohm
Lead Channel Impedance Value: 380 Ohm
Lead Channel Impedance Value: 399 Ohm
Lead Channel Impedance Value: 4047 Ohm
Lead Channel Impedance Value: 4047 Ohm
Lead Channel Impedance Value: 4047 Ohm
Lead Channel Pacing Threshold Amplitude: 1 V
Lead Channel Pacing Threshold Amplitude: 1 V
Lead Channel Pacing Threshold Pulse Width: 0.4 ms
Lead Channel Pacing Threshold Pulse Width: 0.4 ms
Lead Channel Sensing Intrinsic Amplitude: 12.375 mV
Lead Channel Sensing Intrinsic Amplitude: 12.375 mV
Lead Channel Sensing Intrinsic Amplitude: 2.5 mV
Lead Channel Sensing Intrinsic Amplitude: 2.5 mV
Lead Channel Setting Pacing Amplitude: 2.5 V
Lead Channel Setting Pacing Amplitude: 2.5 V
Lead Channel Setting Pacing Pulse Width: 0.4 ms
Lead Channel Setting Sensing Sensitivity: 0.3 mV

## 2019-04-27 ENCOUNTER — Telehealth: Payer: Self-pay | Admitting: Family Medicine

## 2019-04-27 NOTE — Telephone Encounter (Signed)
Patients daughter states that while recently in the hospital they stopped his quinapril because his BP was so low. She has been checking it at home and it has been running in the 140s and 170s. She is concerned and wants to know if he should restart? Also having a needle biopsy for his thyroid on Sept 9th. Wants to know if he should stop Plavix on the 4th and hold for 5 days? Will that be ok?

## 2019-04-27 NOTE — Telephone Encounter (Signed)
Left detailed message on patients daughters voicemail per her request

## 2019-04-27 NOTE — Telephone Encounter (Signed)
First of all yes he should stop the Plavix 5 days before the procedure.  He should start it back up the next day.  He can also go ahead and restart the quinapril but just restarted as a half a tablet every day instead of a whole and will check the blood pressures and see how the kidneys go, please follow-up in 3 to 4 weeks for this.

## 2019-04-28 ENCOUNTER — Ambulatory Visit: Payer: Medicare Other | Admitting: Family Medicine

## 2019-05-04 DIAGNOSIS — C44229 Squamous cell carcinoma of skin of left ear and external auricular canal: Secondary | ICD-10-CM | POA: Diagnosis not present

## 2019-05-05 ENCOUNTER — Encounter: Payer: Self-pay | Admitting: Cardiology

## 2019-05-05 NOTE — Progress Notes (Signed)
Remote ICD transmission.   

## 2019-05-10 DIAGNOSIS — K118 Other diseases of salivary glands: Secondary | ICD-10-CM | POA: Diagnosis not present

## 2019-05-18 ENCOUNTER — Encounter: Payer: Self-pay | Admitting: Family Medicine

## 2019-05-22 ENCOUNTER — Telehealth: Payer: Self-pay | Admitting: Family Medicine

## 2019-05-22 NOTE — Telephone Encounter (Signed)
If that is the soonest I have available then we can do it then, if they need to be done sooner than they can see 1 of my colleagues at this time and we can get him in for a future appointment.

## 2019-05-22 NOTE — Telephone Encounter (Signed)
Appt scheduled for 10/26  Patient aware

## 2019-05-25 DIAGNOSIS — L821 Other seborrheic keratosis: Secondary | ICD-10-CM | POA: Diagnosis not present

## 2019-05-25 DIAGNOSIS — L814 Other melanin hyperpigmentation: Secondary | ICD-10-CM | POA: Diagnosis not present

## 2019-05-25 DIAGNOSIS — D225 Melanocytic nevi of trunk: Secondary | ICD-10-CM | POA: Diagnosis not present

## 2019-05-25 DIAGNOSIS — L820 Inflamed seborrheic keratosis: Secondary | ICD-10-CM | POA: Diagnosis not present

## 2019-05-25 DIAGNOSIS — L570 Actinic keratosis: Secondary | ICD-10-CM | POA: Diagnosis not present

## 2019-05-25 DIAGNOSIS — D1801 Hemangioma of skin and subcutaneous tissue: Secondary | ICD-10-CM | POA: Diagnosis not present

## 2019-06-01 ENCOUNTER — Other Ambulatory Visit: Payer: Self-pay

## 2019-06-01 ENCOUNTER — Encounter: Payer: Self-pay | Admitting: Podiatry

## 2019-06-01 ENCOUNTER — Ambulatory Visit: Payer: Medicare Other | Admitting: Podiatry

## 2019-06-01 DIAGNOSIS — D689 Coagulation defect, unspecified: Secondary | ICD-10-CM | POA: Diagnosis not present

## 2019-06-01 DIAGNOSIS — M79674 Pain in right toe(s): Secondary | ICD-10-CM

## 2019-06-01 DIAGNOSIS — E1149 Type 2 diabetes mellitus with other diabetic neurological complication: Secondary | ICD-10-CM

## 2019-06-01 DIAGNOSIS — E1140 Type 2 diabetes mellitus with diabetic neuropathy, unspecified: Secondary | ICD-10-CM

## 2019-06-01 DIAGNOSIS — L84 Corns and callosities: Secondary | ICD-10-CM

## 2019-06-01 DIAGNOSIS — M79675 Pain in left toe(s): Secondary | ICD-10-CM

## 2019-06-01 DIAGNOSIS — B351 Tinea unguium: Secondary | ICD-10-CM | POA: Diagnosis not present

## 2019-06-01 NOTE — Progress Notes (Signed)
Subjective:   Patient ID: Manuel Kline, male   DOB: 85 y.o.   MRN: [id number]   HPI Patient presents with pain in both feet with lesion formation sub-fifth metatarsal of both feet and nail disease 1-5 both feet has had diabetic shoes in the past and needs new pair it presents with caregiver today   ROS      Objective:  Physical Exam  Neurovascular status reveals diminished sharp dull vibratory and diminished DP pulses bilateral with adequate distal perfusion with patient found to have significant keratotic lesion thickened nailbeds 1-5 both feet that are pain and at risk for neuropathic condition     Assessment:  At risk diabetic neuropathy with mycotic nail infection pain 1-5 both feet with lesions fifth metatarsal bilateral     Plan:  H&P conditions reviewed and debrided the lesions with no iatrogenic bleeding and debrided nailbeds 1-5 both feet with no iatrogenic bleeding and reappoint for routine care and also went ahead casted for diabetic shoes due to long-term condition and risk factors present.  Reappoint when ready

## 2019-06-05 ENCOUNTER — Ambulatory Visit: Payer: Self-pay | Admitting: Pharmacist

## 2019-06-19 ENCOUNTER — Ambulatory Visit: Payer: Self-pay | Admitting: Pharmacist

## 2019-06-23 ENCOUNTER — Other Ambulatory Visit: Payer: Self-pay

## 2019-06-26 ENCOUNTER — Ambulatory Visit: Payer: Self-pay | Admitting: Pharmacist

## 2019-06-26 ENCOUNTER — Encounter: Payer: Self-pay | Admitting: Family Medicine

## 2019-06-26 ENCOUNTER — Ambulatory Visit (INDEPENDENT_AMBULATORY_CARE_PROVIDER_SITE_OTHER): Payer: Medicare Other | Admitting: Family Medicine

## 2019-06-26 VITALS — BP 128/64 | HR 64 | Temp 96.8°F | Ht 65.0 in | Wt 229.2 lb

## 2019-06-26 DIAGNOSIS — E1140 Type 2 diabetes mellitus with diabetic neuropathy, unspecified: Secondary | ICD-10-CM

## 2019-06-26 DIAGNOSIS — E1142 Type 2 diabetes mellitus with diabetic polyneuropathy: Secondary | ICD-10-CM

## 2019-06-26 DIAGNOSIS — E782 Mixed hyperlipidemia: Secondary | ICD-10-CM

## 2019-06-26 DIAGNOSIS — N184 Chronic kidney disease, stage 4 (severe): Secondary | ICD-10-CM

## 2019-06-26 DIAGNOSIS — R29898 Other symptoms and signs involving the musculoskeletal system: Secondary | ICD-10-CM

## 2019-06-26 DIAGNOSIS — Z23 Encounter for immunization: Secondary | ICD-10-CM | POA: Diagnosis not present

## 2019-06-26 DIAGNOSIS — IMO0002 Reserved for concepts with insufficient information to code with codable children: Secondary | ICD-10-CM

## 2019-06-26 DIAGNOSIS — E1165 Type 2 diabetes mellitus with hyperglycemia: Secondary | ICD-10-CM | POA: Diagnosis not present

## 2019-06-26 LAB — BAYER DCA HB A1C WAIVED: HB A1C (BAYER DCA - WAIVED): 8.9 % — ABNORMAL HIGH (ref <7.0)

## 2019-06-26 NOTE — Progress Notes (Signed)
BP 128/64   Pulse 64   Temp (!) 96.8 F (36 C) (Temporal)   Ht 5\' 5"  (1.651 m)   Wt 229 lb 3.2 oz (104 kg)   SpO2 98%   BMI 38.14 kg/m    Subjective:   Patient ID: Manuel Kline, male    DOB: May 04, 1932, 85 y.o.   MRN: [id number]  HPI: Manuel Kline is a 85 y.o. male presenting on 06/26/2019 for Diabetes (check up of chronic medical conditions. Patient would like a rx for a walker with a seat )   HPI Type 2 diabetes mellitus Patient comes in today for recheck of his diabetes. Patient has been currently taking Basaglar and Humulin R. Patient is currently on an ACE inhibitor/ARB. Patient has not seen an ophthalmologist this year. Patient denies any new issues with their feet, patient does have neuropathy in both of his feet and takes gabapentin and says that it is controlled with the gabapentin for the most part.  She also takes Cymbalta to help as well..  Patient also has stage IV CKD.  Hyperlipidemia Patient is coming in for recheck of his hyperlipidemia. The patient is currently taking atorvastatin. They deny any issues with myalgias or history of liver damage from it. They deny any focal numbness or weakness or chest pain.   Patient has weakness in bilateral lower extremities.  He has increased risk for falls because of his increasing weakness in lower extremity and deconditioning.  The likelihood is this is correlated a lot to the coronavirus and being stuck at home and not going places but there is a lot of concern especially among family that he is much more unsteady and likely to fall.  They would like to get a rolling walker for him with a seat.  They feel like this would help him significantly be more mobile and more safe.  Relevant past medical, surgical, family and social history reviewed and updated as indicated. Interim medical history since our last visit reviewed. Allergies and medications reviewed and updated.  Review of Systems  Constitutional: Negative for chills and  fever.  Eyes: Negative for visual disturbance.  Respiratory: Negative for shortness of breath and wheezing.   Cardiovascular: Negative for chest pain and leg swelling.  Musculoskeletal: Negative for back pain and gait problem.  Skin: Negative for rash.  Neurological: Positive for weakness and numbness. Negative for dizziness and light-headedness.  All other systems reviewed and are negative.   Per HPI unless specifically indicated above      Objective:   BP 128/64   Pulse 64   Temp (!) 96.8 F (36 C) (Temporal)   Ht 5\' 5"  (1.651 m)   Wt 229 lb 3.2 oz (104 kg)   SpO2 98%   BMI 38.14 kg/m   Wt Readings from Last 3 Encounters:  06/26/19 229 lb 3.2 oz (104 kg)  04/14/19 227 lb 9.6 oz (103.2 kg)  03/14/19 231 lb (104.8 kg)    Physical Exam Vitals signs and nursing note reviewed.  Constitutional:      General: He is not in acute distress.    Appearance: He is well-developed. He is not diaphoretic.  Eyes:     General: No scleral icterus.    Conjunctiva/sclera: Conjunctivae normal.  Neck:     Musculoskeletal: Neck supple.     Thyroid: No thyromegaly.  Cardiovascular:     Rate and Rhythm: Normal rate and regular rhythm.     Heart sounds: Normal heart sounds. No murmur.  Pulmonary:     Effort: Pulmonary effort is normal. No respiratory distress.     Breath sounds: Normal breath sounds. No wheezing.  Musculoskeletal: Normal range of motion.     Comments: 4 out of 5 strength in bilateral lower extremities, gait is unstable even with a cane  Lymphadenopathy:     Cervical: No cervical adenopathy.  Skin:    General: Skin is warm and dry.     Findings: No rash.  Neurological:     Mental Status: He is alert and oriented to person, place, and time.     Coordination: Coordination normal.  Psychiatric:        Behavior: Behavior normal.       Assessment & Plan:   Problem List Items Addressed This Visit      Endocrine   Type 2 diabetes, uncontrolled, with neuropathy  ([name]) - Primary   Relevant Orders   Bayer DCA Hb A1c Waived (Completed)   For home use only DME 4 wheeled rolling walker with seat ([id number])   Diabetic peripheral neuropathy ([place])   Relevant Orders   For home use only DME 4 wheeled rolling walker with seat ([id number])     Genitourinary   CKD (chronic kidney disease) stage 4, GFR 15-29 ml/min (HCC)     Other   Hyperlipidemia (Chronic)    Other Visit Diagnoses    Need for immunization against influenza       Relevant Orders   Flu Vaccine QUAD High Dose(Fluad) (Completed)   Weakness of both lower extremities       Relevant Orders   For home use only DME 4 wheeled rolling walker with seat ([id number])      Due to patient's gradually increasing lower extremity weakness and his neuropathy and numbness, patient is a fall risk and will do a prescription for a walker with a seat to help with fall prevention  Instructed patient to increase his Basaglar to 28 units nightly, keep NovoLog the same, A1c was 8.9 today Follow up plan: Return in about 3 months (around 09/26/2019), or if symptoms worsen or fail to improve, for diabetes.  Counseling provided for all of the vaccine components Orders Placed This Encounter  Procedures  . For home use only DME 4 wheeled rolling walker with seat ([id number])  . Flu Vaccine QUAD High Dose(Fluad)  . Bayer [facility] Hb A1c [place], MD [facility] 06/26/2019, 12:25 PM

## 2019-06-26 NOTE — Patient Instructions (Signed)
Instructed patient to increase his Basaglar to 28 units nightly, keep NovoLog the same

## 2019-06-27 ENCOUNTER — Other Ambulatory Visit: Payer: Self-pay

## 2019-06-27 ENCOUNTER — Encounter: Payer: Self-pay | Admitting: Family Medicine

## 2019-06-27 ENCOUNTER — Other Ambulatory Visit: Payer: Self-pay | Admitting: Pharmacist

## 2019-06-27 NOTE — Patient Outreach (Signed)
[facility]) [place]  06/27/2019  WILDON CUEVAS 04-May-1932 [id number]  Reason for referral: med managment  [facility] pharmacy case is being closed due to the following reasons:  -Will close [facility] pharmacy case as goals of care have been met.    Regina Eck, PharmD, [place]  [phone]

## 2019-06-27 NOTE — Patient Outreach (Signed)
Manuel Kline [facility]) Care Management  06/27/2019   Manuel Kline 10-04-1931 [id number]  Subjective: Successful outreach to the patient.  To patient identifiers obtained.  The patient states that he is doing fine. He states that he still has pain with his feet and knees. He was seen at his PCP and his a1c has gone up from 7.5 to 8.9.  The patient and his wife states that he has been eating more bread, potatoes and rice. Discussed with the patient about carbs and how they affect his blood sugars.  He verbalized understanding.  He is being consistent with his medications.  He reports that his PCP increase his Basaglar to 28 units daily.    Current Medications:  Current Outpatient Medications  Medication Sig Dispense Refill  . allopurinol (ZYLOPRIM) 300 MG tablet TAKE 1/2 (ONE-HALF) TABLET BY MOUTH ONCE DAILY 45 tablet 1  . atorvastatin (LIPITOR) 80 MG tablet Take 0.5 tablets (40 mg total) by mouth daily. 45 tablet 5  . cloNIDine (CATAPRES) 0.1 MG tablet Take 1 tablet (0.1 mg total) by mouth 2 (two) times daily. 180 tablet 3  . clopidogrel (PLAVIX) 75 MG tablet Take 1 tablet (75 mg total) by mouth daily. 90 tablet 3  . Cyanocobalamin (VITAMIN B 12 PO) Take 1,000 mcg by mouth daily.    . DULoxetine (CYMBALTA) 30 MG capsule Take 1 capsule (30 mg total) by mouth daily. 90 capsule 3  . furosemide (LASIX) 40 MG tablet Take 1 tablet (40 mg total) by mouth 2 (two) times daily. 180 tablet 3  . gabapentin (NEURONTIN) 300 MG capsule Take 1 capsule (300 mg total) by mouth 2 (two) times daily. 180 capsule 3  . glucose blood (ACCU-CHEK GUIDE) test strip 1 each by Other route 5 (five) times daily. Use as instructed 400 each 11  . Insulin Glargine (BASAGLAR KWIKPEN) 100 UNIT/ML SOPN Inject 0.25 mLs (25 Units total) into the skin daily.    . Insulin Pen Needle 29G X 12.7MM MISC Use ti inject insulin 5 times a day 150 each 3  . insulin regular (HUMULIN R) 100 units/mL injection Inject 0.3 mLs (30 Units  total) into the skin 2 (two) times daily before a meal. 60 mL 3  . metoprolol succinate (TOPROL-XL) 50 MG 24 hr tablet Take 1 tablet (50 mg total) by mouth daily. 90 tablet 3  . nitroGLYCERIN (NITROSTAT) 0.4 MG SL tablet Place 0.4 mg under the tongue every 5 (five) minutes as needed for chest pain.    . Omega-3 Fatty Acids (FISH OIL) 1000 MG CPDR Take 1 tablet by mouth daily.    . polyethylene glycol powder (GLYCOLAX/MIRALAX) powder Take 17 g by mouth 2 (two) times daily as needed. 3350 g 1  . quinapril (ACCUPRIL) 40 MG tablet TAKE 1 TABLET BY MOUTH AT BEDTIME 90 tablet 1   No current facility-administered medications for this visit.     Functional Status:  In your present state of health, do you have any difficulty performing the following activities: 04/20/2019 03/27/2019  Hearing? [name]  Comment wears a hearing aid wears hearing aids  Vision? N N  Difficulty concentrating or making decisions? N N  Walking or climbing stairs? Y N  Comment HUrt his ankle and balance -  Dressing or bathing? N N  Doing errands, shopping? N N  Preparing Food and eating ? Y N  Comment Wife and son help with meals -  Using the Toilet? N N  In the past six months, have  you accidently leaked urine? N N  Do you have problems with loss of bowel control? N N  Managing your Medications? Y N  Comment Wife handles meds -  Managing your Finances? Y N  Comment HIs children help manage finances -  Housekeeping or managing your Housekeeping? Y N  Comment Wife and grandchildren help -  Some recent data might be hidden    Fall/Depression Screening: Fall Risk  06/27/2019 06/26/2019 04/20/2019  Falls in the past year? 0 1 1  Number falls in past yr: - 1 1  Injury with Fall? - 0 1  Comment - - Patient fell from a tractor and had bruisees on buttocks and back of left leg.  Risk Factor Category  - - -  Risk for fall due to : - - Impaired balance/gait;Other (Comment)  Risk for fall due to: Comment - - Tractor turned  over and he jumped off and then lost his balance  Follow up - - Falls evaluation completed;Education provided  Comment - - -   PHQ 2/9 Scores 04/20/2019 04/14/2019 03/27/2019 01/19/2019 12/29/2018 12/29/2018 10/20/2018  PHQ - 2 Score 0 0 0 0 0 0 0  PHQ- 9 Score - - - - - - 0    Assessment: Patient will continue to benefit from health coach outreach for disease management and support. THN CM Care Plan Problem One     Most Recent Value  THN Long Term Goal   Pt will verbalize/ demonstrate improved self care related to DM, CHF in 30 days  THN Long Term Goal Start Date  06/27/19  Interventions for Problem One Long Term Goal  Reiwed dieat andhoww eating carbs affects his blood sugars, encouraged to continue to checking his blood sugars daily and weighing, reviewed medications and encouraged medication adherence, encouraged continued visits with his physicians       Plan: [name] will contact patient in the month of November and patient agrees to next outreach.   [name] RN, BSN, [place] Direct Dial:  [phone]  Fax: [phone]

## 2019-06-28 ENCOUNTER — Other Ambulatory Visit: Payer: Self-pay | Admitting: Family Medicine

## 2019-06-28 NOTE — Telephone Encounter (Signed)
Pt aware to call [name] to schedule shipment with them, this is what they instructed me after I called them

## 2019-07-04 ENCOUNTER — Telehealth (INDEPENDENT_AMBULATORY_CARE_PROVIDER_SITE_OTHER): Payer: Medicare Other | Admitting: Cardiology

## 2019-07-04 ENCOUNTER — Other Ambulatory Visit: Payer: Self-pay

## 2019-07-04 ENCOUNTER — Encounter: Payer: Self-pay | Admitting: Cardiology

## 2019-07-04 VITALS — BP 145/76 | HR 68 | Wt 217.0 lb

## 2019-07-04 DIAGNOSIS — I255 Ischemic cardiomyopathy: Secondary | ICD-10-CM | POA: Diagnosis not present

## 2019-07-04 DIAGNOSIS — L570 Actinic keratosis: Secondary | ICD-10-CM | POA: Diagnosis not present

## 2019-07-04 DIAGNOSIS — L280 Lichen simplex chronicus: Secondary | ICD-10-CM | POA: Diagnosis not present

## 2019-07-04 DIAGNOSIS — Z951 Presence of aortocoronary bypass graft: Secondary | ICD-10-CM

## 2019-07-04 DIAGNOSIS — Z95810 Presence of automatic (implantable) cardiac defibrillator: Secondary | ICD-10-CM

## 2019-07-04 DIAGNOSIS — D485 Neoplasm of uncertain behavior of skin: Secondary | ICD-10-CM | POA: Diagnosis not present

## 2019-07-04 DIAGNOSIS — I2510 Atherosclerotic heart disease of native coronary artery without angina pectoris: Secondary | ICD-10-CM

## 2019-07-04 DIAGNOSIS — I10 Essential (primary) hypertension: Secondary | ICD-10-CM

## 2019-07-04 NOTE — Progress Notes (Signed)
Virtual Visit via Video Note   This visit type was conducted due to national recommendations for restrictions regarding the COVID-19 Pandemic (e.g. social distancing) in an effort to limit this patient's exposure and mitigate transmission in our community.  Due to his co-morbid illnesses, this patient is at least at moderate risk for complications without adequate follow up.  This format is felt to be most appropriate for this patient at this time.  All issues noted in this document were discussed and addressed.  A limited physical exam was performed with this format.  Please refer to the patient's chart for his consent to telehealth for [facility].  Evaluation Performed:  Follow-up visit  This visit type was conducted due to national recommendations for restrictions regarding the COVID-19 Pandemic (e.g. social distancing).  This format is felt to be most appropriate for this patient at this time.  All issues noted in this document were discussed and addressed.  No physical exam was performed (except for noted visual exam findings with Video Visits).  Please refer to the patient's chart (MyChart message for video visits and phone note for telephone visits) for the patient's consent to telehealth for [facility].  Date:  07/04/2019  ID: Manuel Kline, DOB 1931-12-16, MRN [id number]   Patient Location: [place] [address]   Provider location:   [place] Office  PCP:  Dettinger, Fransisca Kaufmann, MD  Cardiologist:  Jenne Campus, MD     Chief Complaint: Doing well cardiac wise  History of Present Illness:    Manuel Kline is a 85 y.o. male  who presents via audio/video conferencing for a telehealth visit today.  hx of CAD s/p CABG, CHF, ischemic cardiomyopathy, HLD, CKDIV followed by nephrology, OSA on CPAP, ICD followed by EP.  Overall cardiac wise doing well.  Denies have any shortness of breath, no chest pain, no tightness, no pressure, no burning no squeezing in  the chest.  Leading complaint is the fact that he have difficulty walking around because of unsteady gait.  He also does have significant neuropathy which complicate the situation.  Cardiac wise doing well.  No discharges from the defibrillator   The patient does not have symptoms concerning for COVID-19 infection (fever, chills, cough, or new SHORTNESS OF BREATH).    Prior CV studies:   The following studies were reviewed today:  Echocardiogram from May 28, 2019 showed:  1. The left ventricle has normal systolic function, with an ejection fraction of 55-60%. The cavity size was normal. There is mildly increased left ventricular wall thickness. Left ventricular diastolic Doppler parameters are consistent with impaired  relaxation. No evidence of left ventricular regional wall motion abnormalities.  2. There is mild mitral annular calcification present. No evidence of mitral valve stenosis. No significant mitral regurgitation.  3. The aortic valve is tricuspid Mild calcification of the aortic valve. No aortic stenosis.  4. The aortic root is normal in size and structure.  5. The right ventricle has normal systolc function. The cavity was normal.  6. The IVC was not well-visualized. Peak RV-RA gradient 35 mmHg.     Past Medical History:  Diagnosis Date  . Adenomatous colon polyp 2006  . CAD (coronary artery disease)   . Calcium oxalate renal stones   . Cardiomyopathy   . Cataract   . Diabetes ([place])   . Erectile dysfunction   . Erosive esophagitis   . Hemorrhoids   . HLD (hyperlipidemia)   .  HTN (hypertension)   . Hypertensive heart disease without CHF 07/31/2011  . ICD (implantable cardiac defibrillator) in place   . ICD dual chamber in situ   . Metabolic syndrome   . Morbid obesity ([place])   . Osteoarthritis   . S/P CABG (coronary artery bypass graft) 11/02/2000  . Sleep apnea     Past Surgical History:  Procedure Laterality Date  . ABDOMINAL EXPLORATION SURGERY    .  BACK SURGERY     X'3  . cardiac bypass    . CARDIAC DEFIBRILLATOR PLACEMENT    . CARPAL TUNNEL RELEASE     X2, bilateral  . CATARACT EXTRACTION    . COLONOSCOPY  06/20/2012   Procedure: COLONOSCOPY;  Surgeon: Sable Feil, MD;  Location: [facility];  Service: Endoscopy;  Laterality: N/A;  . DOPPLER ECHOCARDIOGRAPHY  2003  . EP IMPLANTABLE DEVICE N/A 07/14/2016   Procedure: ICD Generator Changeout;  Surgeon: Evans Lance, MD;  Location: [facility];  Service: Cardiovascular;  Laterality: N/A;  . ESOPHAGOGASTRODUODENOSCOPY  06/20/2012   Procedure: ESOPHAGOGASTRODUODENOSCOPY (EGD);  Surgeon: Sable Feil, MD;  Location: [facility];  Service: Endoscopy;  Laterality: N/A;  . EYE SURGERY    . LAPAROTOMY    . RETINOPATHY SURGERY Bilateral   . rotator cuff surgery     left     Current Meds  Medication Sig  . allopurinol (ZYLOPRIM) 300 MG tablet TAKE 1/2 (ONE-HALF) TABLET BY MOUTH ONCE DAILY  . atorvastatin (LIPITOR) 80 MG tablet Take 0.5 tablets (40 mg total) by mouth daily.  . cloNIDine (CATAPRES) 0.1 MG tablet Take 1 tablet (0.1 mg total) by mouth 2 (two) times daily.  . clopidogrel (PLAVIX) 75 MG tablet Take 1 tablet (75 mg total) by mouth daily.  . Cyanocobalamin (VITAMIN B 12 PO) Take 1,000 mcg by mouth daily.  . DULoxetine (CYMBALTA) 30 MG capsule Take 1 capsule (30 mg total) by mouth daily.  . furosemide (LASIX) 40 MG tablet Take 1 tablet (40 mg total) by mouth 2 (two) times daily.  Marland Kitchen gabapentin (NEURONTIN) 300 MG capsule Take 1 capsule (300 mg total) by mouth 2 (two) times daily.  Marland Kitchen glucose blood (ACCU-CHEK GUIDE) test strip 1 each by Other route 5 (five) times daily. Use as instructed  . Insulin Glargine (BASAGLAR KWIKPEN) 100 UNIT/ML SOPN Inject 0.25 mLs (25 Units total) into the skin daily.  . Insulin Pen Needle 29G X 12.7MM MISC Use ti inject insulin 5 times a day  . metoprolol succinate (TOPROL-XL) 50 MG 24 hr tablet Take 1 tablet (50 mg total) by mouth  daily.  . nitroGLYCERIN (NITROSTAT) 0.4 MG SL tablet Place 0.4 mg under the tongue every 5 (five) minutes as needed for chest pain.  . Omega-3 Fatty Acids (FISH OIL) 1000 MG CPDR Take 1 tablet by mouth daily.  . polyethylene glycol powder (GLYCOLAX/MIRALAX) powder Take 17 g by mouth 2 (two) times daily as needed.  . quinapril (ACCUPRIL) 40 MG tablet TAKE 1 TABLET BY MOUTH AT BEDTIME      Family History: The patient's family history includes Cancer in his brother; Diabetes in his brother, brother, brother, mother, sister, sister, sister, sister, and sister; Heart disease in his brother, father, and mother; Kidney disease in his sister; Throat cancer in his paternal uncle.   ROS:   Please see the history of present illness.     All other systems reviewed and are negative.   Labs/Other Tests and Data Reviewed:  Recent Labs: 12/25/2018: TSH 1.421 04/14/2019: ALT 20; BUN 43; Creatinine, Ser 2.11; Hemoglobin 12.6; Platelets 277; Potassium 4.6; Sodium 135  Recent Lipid Panel    Component Value Date/Time   CHOL 140 01/19/2019 1214   TRIG 200 (H) 01/19/2019 1214   HDL 41 01/19/2019 1214   CHOLHDL 3.4 01/19/2019 1214   CHOLHDL 3.4 12/26/2018 0558   VLDL 36 12/26/2018 0558   LDLCALC 59 01/19/2019 1214      Exam:    Vital Signs:  BP (!) 145/76   Pulse 68   Wt 217 lb (98.4 kg)   BMI 36.11 kg/m     Wt Readings from Last 3 Encounters:  07/04/19 217 lb (98.4 kg)  06/26/19 229 lb 3.2 oz (104 kg)  04/14/19 227 lb 9.6 oz (103.2 kg)     Well nourished, well developed in no acute distress. Alert oriented x3 talking to me a video link he sitting in a chair in his home I am in my office at home he is asymptomatic not in any distress  Diagnosis for this visit:   1. Ischemic cardiomyopathy   2. Presence of automatic (implantable) cardiac defibrillator   3. S/P CABG (coronary artery bypass graft)   4. Dual implantable cardioverter-defibrillator in situ   5. Coronary artery disease  involving native coronary artery of native heart without angina pectoris   6. Essential hypertension      ASSESSMENT & PLAN:    1.  Ischemic cardiomyopathy with latest estimation of his left ventricular ejection fraction being done in April showing ejection fraction 55 to 60%.  On appropriate medications which I will continue.  He does not have any exertional shortness of breath no proximal nocturnal dyspnea no swelling of lower extremities. 2.  ICD present last interrogation done in August this year and reviewed.  No discharges OptiVol was stable parameters were stable battery status is good. 3.  Status post coronary artery bypass graft asymptomatic, denies having any chest pain, tightness, squeezing, pressure, burning in the chest. 4.  Essential hypertension appears to be well controlled. 5.  Dyslipidemia last fasting lipid profile I have is from May of this year showing HDL 41 LDL 59 however his triglycerides were elevated 200.  This is most likely related to poorly controlled diabetes 6.  Diabetes mellitus.  Recent hemoglobin A1c was elevated at 8.9.  Dose of his medication has been increased and aggressively managed by internal medicine team.  He needs to have better control diabetes.  Cardiac wise doing well, will continue present management.  We will see him back in my office in 5 months   COVID-19 Education: The signs and symptoms of COVID-19 were discussed with the patient and how to seek care for testing (follow up with PCP or arrange E-visit).  The importance of social distancing was discussed today.  Patient Risk:   After full review of this patients clinical status, I feel that they are at least moderate risk at this time.  Time:   Today, I have spent 5 minutes with the patient with telehealth technology discussing pt health issues.  I spent 22 minutes reviewing her chart before the visit.  Visit was finished at 9:48 AM.    Medication Adjustments/Labs and Tests Ordered:  Current medicines are reviewed at length with the patient today.  Concerns regarding medicines are outlined above.  No orders of the defined types were placed in this encounter.  Medication changes: No orders of the defined types were placed in this encounter.  Disposition: Follow-up 5 months  Signed, Park Liter, MD, [facility] 07/04/2019 9:43 AM    [place]

## 2019-07-04 NOTE — Patient Instructions (Addendum)
Medication Instructions:  Your physician recommends that you continue on your current medications as directed. Please refer to the Current Medication list given to you today.  *If you need a refill on your cardiac medications before your next appointment, please call your pharmacy*  Lab Work: None.  If you have labs (blood work) drawn today and your tests are completely normal, you will receive your results only by: Marland Kitchen MyChart Message (if you have MyChart) OR . A paper copy in the mail If you have any lab test that is abnormal or we need to change your treatment, we will call you to review the results.  Testing/Procedures: None.   Follow-Up: At [facility], you and your health needs are our priority.  As part of our continuing mission to provide you with exceptional heart care, we have created designated Provider Care Teams.  These Care Teams include your primary Cardiologist (physician) and Advanced Practice Providers (APPs -  Physician Assistants and Nurse Practitioners) who all work together to provide you with the care you need, when you need it.  Your next appointment:   5 months  The format for your next appointment:   In Person  Provider:   You may see Jenne Campus, MD or the following Advanced Practice Provider on your designated Care Team:    Laurann Montana, FNP   Other Instructions

## 2019-07-10 DIAGNOSIS — E1142 Type 2 diabetes mellitus with diabetic polyneuropathy: Secondary | ICD-10-CM | POA: Diagnosis not present

## 2019-07-10 DIAGNOSIS — N184 Chronic kidney disease, stage 4 (severe): Secondary | ICD-10-CM | POA: Diagnosis not present

## 2019-07-10 DIAGNOSIS — E1165 Type 2 diabetes mellitus with hyperglycemia: Secondary | ICD-10-CM | POA: Diagnosis not present

## 2019-07-10 DIAGNOSIS — E1140 Type 2 diabetes mellitus with diabetic neuropathy, unspecified: Secondary | ICD-10-CM | POA: Diagnosis not present

## 2019-07-10 DIAGNOSIS — R29898 Other symptoms and signs involving the musculoskeletal system: Secondary | ICD-10-CM | POA: Diagnosis not present

## 2019-07-25 ENCOUNTER — Other Ambulatory Visit: Payer: Self-pay | Admitting: *Deleted

## 2019-07-25 NOTE — Patient Outreach (Addendum)
[facility]) Care Management  07/25/2019  Manuel Kline 1931-12-29 [id number]   RN Health Coach attempted follow up outreach call to patient.  Patient was unavailable. HIPPA compliance voicemail message left with return callback number.  Plan: RN will call patient again within 30 days.  [place] Care Management [phone]

## 2019-07-26 ENCOUNTER — Ambulatory Visit (INDEPENDENT_AMBULATORY_CARE_PROVIDER_SITE_OTHER): Payer: Medicare Other | Admitting: *Deleted

## 2019-07-26 DIAGNOSIS — I5022 Chronic systolic (congestive) heart failure: Secondary | ICD-10-CM | POA: Diagnosis not present

## 2019-07-26 LAB — CUP PACEART REMOTE DEVICE CHECK
Battery Remaining Longevity: 76 mo
Battery Voltage: 3 V
Brady Statistic AP VP Percent: 0.13 %
Brady Statistic AP VS Percent: 89.88 %
Brady Statistic AS VP Percent: 0.01 %
Brady Statistic AS VS Percent: 9.98 %
Brady Statistic RA Percent Paced: 89.95 %
Brady Statistic RV Percent Paced: 0.12 %
Date Time Interrogation Session: 20201125061605
HighPow Impedance: 53 Ohm
HighPow Impedance: 68 Ohm
Implantable Lead Implant Date: 20030519
Implantable Lead Implant Date: 20030519
Implantable Lead Location: 753859
Implantable Lead Location: 753860
Implantable Lead Model: 158
Implantable Lead Model: 4087
Implantable Lead Serial Number: 115102
Implantable Lead Serial Number: 159999
Implantable Pulse Generator Implant Date: 20171114
Lead Channel Impedance Value: 380 Ohm
Lead Channel Impedance Value: 4047 Ohm
Lead Channel Impedance Value: 4047 Ohm
Lead Channel Impedance Value: 4047 Ohm
Lead Channel Impedance Value: 437 Ohm
Lead Channel Impedance Value: 437 Ohm
Lead Channel Pacing Threshold Amplitude: 0.875 V
Lead Channel Pacing Threshold Amplitude: 1.25 V
Lead Channel Pacing Threshold Pulse Width: 0.4 ms
Lead Channel Pacing Threshold Pulse Width: 0.4 ms
Lead Channel Sensing Intrinsic Amplitude: 13.75 mV
Lead Channel Sensing Intrinsic Amplitude: 13.75 mV
Lead Channel Sensing Intrinsic Amplitude: 2.5 mV
Lead Channel Sensing Intrinsic Amplitude: 2.5 mV
Lead Channel Setting Pacing Amplitude: 2.5 V
Lead Channel Setting Pacing Amplitude: 2.5 V
Lead Channel Setting Pacing Pulse Width: 0.4 ms
Lead Channel Setting Sensing Sensitivity: 0.3 mV

## 2019-07-26 NOTE — Patient Outreach (Signed)
[facility]) Care Management  [place]  07/26/2019   GENIE MIRABAL [birth year] [id number]  [place] received return  telephone call from patient.  Hipaa compliance verified.Per patient his A1C is 8.9. Patient is currently getting meals on wheels. His wife and son cooks food. Patient is not exercising. He is usng a cane to ambulate and has started dricving. Patient has agreed to follow up outreach calls.    Encounter Medications:  Outpatient Encounter Medications as of 07/25/2019  Medication Sig Note  . allopurinol (ZYLOPRIM) 300 MG tablet TAKE 1/2 (ONE-HALF) TABLET BY MOUTH ONCE DAILY   . atorvastatin (LIPITOR) 80 MG tablet Take 0.5 tablets (40 mg total) by mouth daily.   . cloNIDine (CATAPRES) 0.1 MG tablet Take 1 tablet (0.1 mg total) by mouth 2 (two) times daily.   . clopidogrel (PLAVIX) 75 MG tablet Take 1 tablet (75 mg total) by mouth daily.   . Cyanocobalamin (VITAMIN B 12 PO) Take 1,000 mcg by mouth daily.   . DULoxetine (CYMBALTA) 30 MG capsule Take 1 capsule (30 mg total) by mouth daily.   . furosemide (LASIX) 40 MG tablet Take 1 tablet (40 mg total) by mouth 2 (two) times daily.   Marland Kitchen gabapentin (NEURONTIN) 300 MG capsule Take 1 capsule (300 mg total) by mouth 2 (two) times daily.   Marland Kitchen glucose blood (ACCU-CHEK GUIDE) test strip 1 each by Other route 5 (five) times daily. Use as instructed   . Insulin Glargine (BASAGLAR KWIKPEN) 100 UNIT/ML SOPN Inject 0.25 mLs (25 Units total) into the skin daily. 06/27/2019: Medication increased to 28 units   . Insulin Pen Needle 29G X 12.7MM MISC Use ti inject insulin 5 times a day   . insulin regular (HUMULIN R) 100 units/mL injection Inject 0.3 mLs (30 Units total) into the skin 2 (two) times daily before a meal.   . metoprolol succinate (TOPROL-XL) 50 MG 24 hr tablet Take 1 tablet (50 mg total) by mouth daily.   . nitroGLYCERIN (NITROSTAT) 0.4 MG SL tablet Place 0.4 mg under the tongue every 5 (five) minutes as  needed for chest pain. 12/25/2018: Pt has on hand.  . Omega-3 Fatty Acids (FISH OIL) 1000 MG CPDR Take 1 tablet by mouth daily.   . polyethylene glycol powder (GLYCOLAX/MIRALAX) powder Take 17 g by mouth 2 (two) times daily as needed.   . quinapril (ACCUPRIL) 40 MG tablet TAKE 1 TABLET BY MOUTH AT BEDTIME    No facility-administered encounter medications on file as of 07/25/2019.     Functional Status:  In your present state of health, do you have any difficulty performing the following activities: 07/25/2019 04/20/2019  Hearing? Y Y  Comment hearing aids wears a hearing aid  Vision? N N  Difficulty concentrating or making decisions? N N  Walking or climbing stairs? Y Y  Comment patient uses a walker HUrt his ankle and balance  Dressing or bathing? N N  Doing errands, shopping? N N  Preparing Food and eating ? [name]  Comment wife and son help with meals and now patient is getting meals on wheels from [employer] Wife and son help with meals  Using the Toilet? N N  In the past six months, have you accidently leaked urine? N N  Do you have problems with loss of bowel control? - N  Managing your Medications? N Y  Comment - Wife handles meds  Managing your Finances? [name]  Comment His children assist and  [facility] pharmacy HIs children help manage finances  Housekeeping or managing your Housekeeping? [name]  Comment wife and grandchildren help Wife and grandchildren help  Some recent data might be hidden    Fall/Depression Screening: Fall Risk  07/25/2019 06/27/2019 06/26/2019  Falls in the past year? 0 0 1  Number falls in past yr: 1 - 1  Injury with Fall? 1 - 0  Comment bruising - -  Risk Factor Category  - - -  Risk for fall due to : History of fall(s);Impaired balance/gait;Impaired mobility - -  Risk for fall due to: Comment - - -  Follow up Falls evaluation completed;Falls prevention discussed - -  Comment - - -   PHQ 2/9 Scores 07/25/2019 04/20/2019 04/14/2019 03/27/2019 01/19/2019  12/29/2018 12/29/2018  PHQ - 2 Score 0 0 0 0 0 0 0  PHQ- 9 Score - - - - - - -   THN CM Care Plan Problem One     Most Recent Value  Care Plan Problem One  Knowledge Deficit in Self Management of Diabetes  Role Documenting the Problem One  [place] for Problem One  Active  THN Long Term Goal   Patient will see a devrease in his A1C from 8.9 within the next 90 days  [place] Term Goal Start Date  07/26/19  Interventions for Problem One Long Term Goal  RN discussed with the patient what his A1C is and his fasting blood sugar of 200. RN discussed that that fasting blood sugar needs to be 80-130 to see a decrease in A1C 7 or below. RN will follow up with further discussion  THN CM Short Term Goal #1   patient will have a better understanding of healthy snacks to eat within the next 30 days  THN CM Short Term Goal #1 Start Date  07/26/19  Interventions for Short Term Goal #1  RN discussed healthy low carb snacks that patient can eat between meals. RN sent sheets on low carb snacks. RN will follow up with further discusssion  THN CM Short Term Goal #2   Patient will verbalize the importance of monitoring his blood sugars closely and documenting within the next 30 days  THN CM Short Term Goal #2 Start Date  07/25/19  Interventions for Short Term Goal #2  RN discussed checking and  documenting blood sugars. RN discussed eating healthy and keeping in the goal range. RN will follow up for compliance.      Assessment:  Patient fasting blood sugars 140-over 200 Patient is now getting meals on wheels Patient drives self to Dr office Ambulates with a cane  Plan:  RN discussed checking blood sugar and goal range 80-130 fasting RN discussed A1C RN discussed eating healthy snacks RN sent list of healthy snacks RN will follow up within the month of February  Dominque Marlin [place] [facility] [phone]

## 2019-08-10 ENCOUNTER — Other Ambulatory Visit: Payer: Self-pay | Admitting: Cardiology

## 2019-08-10 DIAGNOSIS — I5022 Chronic systolic (congestive) heart failure: Secondary | ICD-10-CM

## 2019-08-14 ENCOUNTER — Telehealth: Payer: Self-pay | Admitting: Podiatry

## 2019-08-14 NOTE — Telephone Encounter (Signed)
pts daughter asked if there was anyway she could just pick up the shoes since her dad is 49 and they are worried about covid. I explained per medicare pt has to be seen.

## 2019-08-14 NOTE — Telephone Encounter (Signed)
pts daughter left message asking about pts diabetic shoes.   I called and shoes were on back order and they look like it should be shipping this week and I will call when they come in.Marland KitchenMarland Kitchen

## 2019-08-21 DIAGNOSIS — N184 Chronic kidney disease, stage 4 (severe): Secondary | ICD-10-CM | POA: Diagnosis not present

## 2019-08-22 DIAGNOSIS — E113393 Type 2 diabetes mellitus with moderate nonproliferative diabetic retinopathy without macular edema, bilateral: Secondary | ICD-10-CM | POA: Diagnosis not present

## 2019-08-22 DIAGNOSIS — H35041 Retinal micro-aneurysms, unspecified, right eye: Secondary | ICD-10-CM | POA: Diagnosis not present

## 2019-08-22 DIAGNOSIS — H3561 Retinal hemorrhage, right eye: Secondary | ICD-10-CM | POA: Diagnosis not present

## 2019-08-22 DIAGNOSIS — H3562 Retinal hemorrhage, left eye: Secondary | ICD-10-CM | POA: Diagnosis not present

## 2019-08-22 NOTE — Progress Notes (Signed)
ICD remote 

## 2019-08-28 DIAGNOSIS — G4733 Obstructive sleep apnea (adult) (pediatric): Secondary | ICD-10-CM | POA: Diagnosis not present

## 2019-09-05 ENCOUNTER — Ambulatory Visit (INDEPENDENT_AMBULATORY_CARE_PROVIDER_SITE_OTHER): Payer: Medicare Other | Admitting: Orthotics

## 2019-09-05 ENCOUNTER — Other Ambulatory Visit: Payer: Self-pay

## 2019-09-05 DIAGNOSIS — D689 Coagulation defect, unspecified: Secondary | ICD-10-CM

## 2019-09-05 DIAGNOSIS — B351 Tinea unguium: Secondary | ICD-10-CM

## 2019-09-05 DIAGNOSIS — E1142 Type 2 diabetes mellitus with diabetic polyneuropathy: Secondary | ICD-10-CM | POA: Diagnosis not present

## 2019-09-05 DIAGNOSIS — M79675 Pain in left toe(s): Secondary | ICD-10-CM | POA: Diagnosis not present

## 2019-09-05 DIAGNOSIS — M2041 Other hammer toe(s) (acquired), right foot: Secondary | ICD-10-CM

## 2019-09-05 DIAGNOSIS — L84 Corns and callosities: Secondary | ICD-10-CM

## 2019-09-05 DIAGNOSIS — E1149 Type 2 diabetes mellitus with other diabetic neurological complication: Secondary | ICD-10-CM

## 2019-09-05 DIAGNOSIS — E1140 Type 2 diabetes mellitus with diabetic neuropathy, unspecified: Secondary | ICD-10-CM

## 2019-09-05 NOTE — Progress Notes (Signed)

## 2019-09-06 ENCOUNTER — Encounter: Payer: Self-pay | Admitting: Family Medicine

## 2019-09-26 ENCOUNTER — Other Ambulatory Visit: Payer: Self-pay | Admitting: Family Medicine

## 2019-10-04 ENCOUNTER — Other Ambulatory Visit: Payer: Self-pay | Admitting: Pharmacy Technician

## 2019-10-04 ENCOUNTER — Other Ambulatory Visit: Payer: Self-pay | Admitting: Family Medicine

## 2019-10-04 DIAGNOSIS — L570 Actinic keratosis: Secondary | ICD-10-CM | POA: Diagnosis not present

## 2019-10-04 DIAGNOSIS — H905 Unspecified sensorineural hearing loss: Secondary | ICD-10-CM | POA: Diagnosis not present

## 2019-10-04 DIAGNOSIS — Z8739 Personal history of other diseases of the musculoskeletal system and connective tissue: Secondary | ICD-10-CM

## 2019-10-04 DIAGNOSIS — L708 Other acne: Secondary | ICD-10-CM | POA: Diagnosis not present

## 2019-10-04 NOTE — Telephone Encounter (Signed)
30 day supply given. Pt is due to be seen.

## 2019-10-04 NOTE — Patient Outreach (Signed)
[place] [facility]) Care Management  10/04/2019  MUTASIM TUCKEY 1932/04/15 [id number]   Incoming call from patients wife, [name], requesting assistance with reapplying for [employer] and Humulin R thru [employer]. Mrs. Thune confirms that prescriber is Dr. Warrick Parisian and patient is still on [employer].  Will route note to [name] for medication review   [name]. Chana Bode [place] Certified Pharmacy Technician [place] Management Direct Dial:[phone]

## 2019-10-05 ENCOUNTER — Other Ambulatory Visit: Payer: Self-pay | Admitting: Pharmacist

## 2019-10-05 NOTE — Patient Outreach (Addendum)
[facility]) Care Management  [place]   10/05/2019  RIAAN TOLEDO 05-20-1932 [id number]  [place] assisted patient with applying for Basaglar and Humulin R through [employer] patient assistance program in 2020.  Patient's spouse contacted [facility] for assistance with renewals in 2021.    PMHx includes but not limited to:  T2DM, HTN / HLD, ICM s/p ICD, HF, CAD s/p CABG '02, CKD-IV, vitamin D deficiency, anemia, DJD, gout, hx TIA  Per review of medications: -Allopurinol currently 150mg  daily, PI recommends 50mg  daily, monitor closely and adjust as clinically appropriate  Plan: I will route patient assistance letter to [place] technician who will coordinate patient assistance program application process for medications listed above.  [facility] pharmacy technician will assist with obtaining all required documents from both patient and provider(s) and submit application(s) once completed.    Ralene Bathe, PharmD, [place] [phone]

## 2019-10-06 ENCOUNTER — Other Ambulatory Visit: Payer: Self-pay | Admitting: Pharmacy Technician

## 2019-10-06 NOTE — Patient Outreach (Signed)
[facility]) Care Management  10/06/2019  MOSTAFA YUAN June 08, 1932 [id number]                                       Medication Assistance Referral  Referral From: [place]  Medication/Company: Humulin R and Basaglar / [place] Patient application portion:  Education officer, museum portion: Faxed  to Dr. Lenna Sciara. [place] Provider address/fax verified via: Office website  Follow up:  Will follow up with patient in 10-14 business days to confirm application(s) have been received.  [name] Chana Bode [place] Certified Pharmacy Technician [place] Management Direct Dial:[phone]

## 2019-10-16 ENCOUNTER — Other Ambulatory Visit: Payer: Self-pay | Admitting: Pharmacy Technician

## 2019-10-16 NOTE — Patient Outreach (Signed)
[facility]) Care Management  10/16/2019  JOWELL BOSSI [birth year] [id number]   Received patient portion(s) of patient assistance application(s) for Humulin R and Basaglar. Faxed completed application and required documents into [employer].  Will follow up with company(ies) in 10-14 business days to check status of application(s).  [name] Chana Bode [place] Certified Pharmacy Technician [place] Management Direct Dial:[phone]

## 2019-10-17 DIAGNOSIS — K118 Other diseases of salivary glands: Secondary | ICD-10-CM | POA: Diagnosis not present

## 2019-10-24 ENCOUNTER — Other Ambulatory Visit: Payer: Self-pay | Admitting: *Deleted

## 2019-10-24 NOTE — Patient Outreach (Signed)
[facility]) Care Management  [place]  10/24/2019   ZAKEE DEERMAN 1932-07-25 [id number]  [place] telephone call to patient.  Hipaa compliance verified. Patient stated he is doing good. Patient has gotten both COVID vaccines. Patient stated his fasting blood sugar is 150. Per patient it has been running 77-155. Patient is getting meals on wheels as there dinner and his wife cook breakfast and lunch. Patient is able to drive to his appointments locally and his family will take him to the appointments in [place]. Patient has agreed to further outreach calls.   Encounter Medications:  Outpatient Encounter Medications as of 10/24/2019  Medication Sig Note  . allopurinol (ZYLOPRIM) 300 MG tablet Take 1/2 (one-half) tablet by mouth once daily   . atorvastatin (LIPITOR) 80 MG tablet Take 0.5 tablets (40 mg total) by mouth daily.   . cloNIDine (CATAPRES) 0.1 MG tablet Take 1 tablet (0.1 mg total) by mouth 2 (two) times daily.   . clopidogrel (PLAVIX) 75 MG tablet Take 1 tablet (75 mg total) by mouth daily.   . Cyanocobalamin (VITAMIN B 12 PO) Take 1,000 mcg by mouth daily.   . DULoxetine (CYMBALTA) 30 MG capsule Take 1 capsule (30 mg total) by mouth daily.   . furosemide (LASIX) 40 MG tablet Take 1 tablet (40 mg total) by mouth 2 (two) times daily.   Marland Kitchen gabapentin (NEURONTIN) 300 MG capsule Take 1 capsule (300 mg total) by mouth 2 (two) times daily.   Marland Kitchen glucose blood (ACCU-CHEK GUIDE) test strip 1 each by Other route 5 (five) times daily. Use as instructed   . Insulin Glargine (BASAGLAR KWIKPEN) 100 UNIT/ML SOPN Inject 0.25 mLs (25 Units total) into the skin daily. 06/27/2019: Medication increased to 28 units   . Insulin Pen Needle 29G X 12.7MM MISC Use ti inject insulin 5 times a day   . insulin regular (HUMULIN R) 100 units/mL injection Inject 0.3 mLs (30 Units total) into the skin 2 (two) times daily before a meal.   . Insulin Syringe-Needle U-100 (RELION INSULIN  SYRINGE) 31G X 15/64" 1 ML MISC Use to inject 5 times daily Dx E11.42   . metoprolol succinate (TOPROL-XL) 50 MG 24 hr tablet Take 1 tablet (50 mg total) by mouth daily.   . nitroGLYCERIN (NITROSTAT) 0.4 MG SL tablet Place 0.4 mg under the tongue every 5 (five) minutes as needed for chest pain. 12/25/2018: Pt has on hand.  . Omega-3 Fatty Acids (FISH OIL) 1000 MG CPDR Take 1 tablet by mouth daily.   . polyethylene glycol powder (GLYCOLAX/MIRALAX) powder Take 17 g by mouth 2 (two) times daily as needed.   . quinapril (ACCUPRIL) 40 MG tablet TAKE 1 TABLET BY MOUTH AT BEDTIME    No facility-administered encounter medications on file as of 10/24/2019.    Functional Status:  In your present state of health, do you have any difficulty performing the following activities: 10/24/2019 07/25/2019  Hearing? Y Y  Comment patient has hearing aids hearing aids  Vision? Y N  Difficulty concentrating or making decisions? N N  Walking or climbing stairs? [name]  Comment patient uses a walker patient uses a walker  Dressing or bathing? N N  Doing errands, shopping? N N  Preparing Food and eating ? Y Y  Comment meals on wheels and wife and son helps prepare meals wife and son help with meals and now patient is getting meals on wheels from [place]? N  N  In the past six months, have you accidently leaked urine? N N  Do you have problems with loss of bowel control? N -  Managing your Medications? N N  Comment - -  Managing your Finances? [name]  Comment His children help and thn pharmacy is helping with medications His children assist and [facility] pharmacy  Housekeeping or managing your Housekeeping? [name]  Comment wife and grandchildren help wife and grandchildren help  Some recent data might be hidden    Fall/Depression Screening: Fall Risk  10/24/2019 07/25/2019 06/27/2019  Falls in the past year? 1 0 0  Number falls in past yr: 0 1 -  Injury with Fall? 0 1 -  Comment - bruising -  Risk  Factor Category  - - -  Risk for fall due to : History of fall(s);Impaired balance/gait;Impaired mobility History of fall(s);Impaired balance/gait;Impaired mobility -  Risk for fall due to: Comment - - -  Follow up Falls evaluation completed Falls evaluation completed;Falls prevention discussed -  Comment - - -   PHQ 2/9 Scores 10/24/2019 07/25/2019 04/20/2019 04/14/2019 03/27/2019 01/19/2019 12/29/2018  PHQ - 2 Score 0 0 0 0 0 0 0  PHQ- 9 Score - - - - - - -   THN CM Care Plan Problem One     Most Recent Value  Care Plan Problem One  Knowledge Deficit in Self Management of Diabetes  Role Documenting the Problem One  [place] for Problem One  Active  THN Long Term Goal   Patient will see a decrease in his A1C from 8.9 within the next 90 days  THN Long Term Goal Start Date  10/24/19  Interventions for Problem One Long Term Goal  RN reiterated checking the blood sugar and what his A1C goal is. Patient is awaiting A1C to be drawn.  RN Will follow up with further discussion  THN CM Short Term Goal #1   patient will have a better understanding of healthy snacks to eat within the next 30 days  THN CM Short Term Goal #1 Met Date  10/24/19  [facility] CM Short Term Goal #2   Patient will verbalize the importance of monitoring his blood sugars closely and documenting within the next 30 days  THN CM Short Term Goal #2 Start Date  10/24/19  Interventions for Short Term Goal #2  RN reiterated the importance of checking blood sugars. RN sent EMMI education on why you check your blood sugars and Why you get your A1C checked. RN will follow up with further discussion      Assessment:  Patient fastin blood sugar 150 A1C is 8.9 Goal is 7.0 Patient has received both COVID vaccines Patient does having an Advance dirctive Patient receives Meals on Wheels Plan:  Patient has eye exam scheduled for July RN sent a Calendar book for documenting blood sugars Patient will monitor blood sugars as per Dr  ordered Patient will  take medications as prescribed RN sent educational EMMI Why check your blood sugars RN sent educational EMMI Why check your A1C RN will follow up within the month of May   Sagan Maselli [place] Management [phone]

## 2019-10-25 ENCOUNTER — Ambulatory Visit (INDEPENDENT_AMBULATORY_CARE_PROVIDER_SITE_OTHER): Payer: Medicare Other | Admitting: *Deleted

## 2019-10-25 DIAGNOSIS — I5022 Chronic systolic (congestive) heart failure: Secondary | ICD-10-CM | POA: Diagnosis not present

## 2019-10-25 LAB — CUP PACEART REMOTE DEVICE CHECK
Battery Remaining Longevity: 73 mo
Battery Voltage: 2.99 V
Brady Statistic AP VP Percent: 0.15 %
Brady Statistic AP VS Percent: 92.38 %
Brady Statistic AS VP Percent: 0.01 %
Brady Statistic AS VS Percent: 7.46 %
Brady Statistic RA Percent Paced: 92.47 %
Brady Statistic RV Percent Paced: 0.14 %
Date Time Interrogation Session: 20210224112825
HighPow Impedance: 54 Ohm
HighPow Impedance: 73 Ohm
Implantable Lead Implant Date: 20030519
Implantable Lead Implant Date: 20030519
Implantable Lead Location: 753859
Implantable Lead Location: 753860
Implantable Lead Model: 158
Implantable Lead Model: 4087
Implantable Lead Serial Number: 115102
Implantable Lead Serial Number: 159999
Implantable Pulse Generator Implant Date: 20171114
Lead Channel Impedance Value: 4047 Ohm
Lead Channel Impedance Value: 4047 Ohm
Lead Channel Impedance Value: 4047 Ohm
Lead Channel Impedance Value: 456 Ohm
Lead Channel Impedance Value: 456 Ohm
Lead Channel Impedance Value: 513 Ohm
Lead Channel Pacing Threshold Amplitude: 0.875 V
Lead Channel Pacing Threshold Amplitude: 1.125 V
Lead Channel Pacing Threshold Pulse Width: 0.4 ms
Lead Channel Pacing Threshold Pulse Width: 0.4 ms
Lead Channel Sensing Intrinsic Amplitude: 12.5 mV
Lead Channel Sensing Intrinsic Amplitude: 12.5 mV
Lead Channel Sensing Intrinsic Amplitude: 2.375 mV
Lead Channel Sensing Intrinsic Amplitude: 2.375 mV
Lead Channel Setting Pacing Amplitude: 2.5 V
Lead Channel Setting Pacing Amplitude: 2.5 V
Lead Channel Setting Pacing Pulse Width: 0.4 ms
Lead Channel Setting Sensing Sensitivity: 0.3 mV

## 2019-10-25 NOTE — Progress Notes (Signed)
ICD Remote  

## 2019-11-01 ENCOUNTER — Other Ambulatory Visit: Payer: Self-pay | Admitting: Cardiology

## 2019-11-01 DIAGNOSIS — I5022 Chronic systolic (congestive) heart failure: Secondary | ICD-10-CM

## 2019-11-02 ENCOUNTER — Other Ambulatory Visit: Payer: Self-pay | Admitting: Pharmacy Technician

## 2019-11-02 NOTE — Patient Outreach (Signed)
[facility]) Care Management  11/02/2019  Manuel Kline 09-30-1931 [id number]    Follow up call placed to [facility] regarding patient assistance application(s) for Basaglar and Humulin R) , [name] confirms patient has been approved as of 2/25 until 08/30/2020. [facility] to reach out to patient to set up delivery.  Follow up:  Will follow up with patient in 3-5 business days to confirms shipment has been scheduled.  [name] Certified Pharmacy Technician [place] Management Direct [phone]

## 2019-11-15 ENCOUNTER — Encounter: Payer: Self-pay | Admitting: Family Medicine

## 2019-11-15 ENCOUNTER — Other Ambulatory Visit: Payer: Self-pay

## 2019-11-15 ENCOUNTER — Ambulatory Visit (INDEPENDENT_AMBULATORY_CARE_PROVIDER_SITE_OTHER): Payer: Medicare Other | Admitting: Family Medicine

## 2019-11-15 VITALS — BP 131/64 | HR 78 | Temp 98.4°F | Ht 65.0 in | Wt 226.4 lb

## 2019-11-15 DIAGNOSIS — E782 Mixed hyperlipidemia: Secondary | ICD-10-CM | POA: Diagnosis not present

## 2019-11-15 DIAGNOSIS — IMO0002 Reserved for concepts with insufficient information to code with codable children: Secondary | ICD-10-CM

## 2019-11-15 DIAGNOSIS — N184 Chronic kidney disease, stage 4 (severe): Secondary | ICD-10-CM

## 2019-11-15 DIAGNOSIS — E1140 Type 2 diabetes mellitus with diabetic neuropathy, unspecified: Secondary | ICD-10-CM | POA: Diagnosis not present

## 2019-11-15 DIAGNOSIS — I10 Essential (primary) hypertension: Secondary | ICD-10-CM | POA: Diagnosis not present

## 2019-11-15 DIAGNOSIS — E1165 Type 2 diabetes mellitus with hyperglycemia: Secondary | ICD-10-CM | POA: Diagnosis not present

## 2019-11-15 LAB — BAYER DCA HB A1C WAIVED: HB A1C (BAYER DCA - WAIVED): 8.7 % — ABNORMAL HIGH (ref <7.0)

## 2019-11-15 MED ORDER — LANCET DEVICE MISC: 1.0000 | Freq: Four times a day (QID) | 1 refills | Status: DC

## 2019-11-15 NOTE — Patient Instructions (Signed)
Increase basaglar to 32 units daily

## 2019-11-15 NOTE — Progress Notes (Signed)
BP 131/64   Pulse 78   Temp 98.4 F (36.9 C)   Ht '5\' 5"'$  (1.651 m)   Wt 226 lb 6 oz (102.7 kg)   SpO2 100%   BMI 37.67 kg/m    Subjective:   Patient ID: Manuel Kline, male    DOB: 07-05-1932, 86 y.o.   MRN: [id number]  HPI: Manuel Kline is a 86 y.o. male presenting on 11/15/2019 for Medical Management of Chronic Issues and Diabetes   HPI Type 2 diabetes mellitus Patient comes in today for recheck of his diabetes. Patient has been currently taking Basaglar and Humulin R. Patient is currently on an ACE inhibitor/ARB. Patient has not seen an ophthalmologist this year. Patient denies any issues with their feet. ckd4 is stable for patient.  Hypertension Patient is currently on metoprolol and clonidine and furosemide and quinapril, and their blood pressure today is 131/64. Patient denies any lightheadedness or dizziness. Patient denies headaches, blurred vision, chest pains, shortness of breath, or weakness. Denies any side effects from medication and is content with current medication.   Hyperlipidemia Patient is coming in for recheck of his hyperlipidemia. The patient is currently taking omega-3's and atorvastatin. They deny any issues with myalgias or history of liver damage from it. They deny any focal numbness or weakness or chest pain.   Relevant past medical, surgical, family and social history reviewed and updated as indicated. Interim medical history since our last visit reviewed. Allergies and medications reviewed and updated.  Review of Systems  Constitutional: Negative for chills and fever.  Eyes: Negative for visual disturbance.  Respiratory: Negative for shortness of breath and wheezing.   Cardiovascular: Negative for chest pain and leg swelling.  Musculoskeletal: Negative for back pain and gait problem.  Skin: Negative for rash.  Neurological: Negative for dizziness.  All other systems reviewed and are negative.   Per HPI unless specifically indicated  above   Allergies as of 11/15/2019   No Known Allergies     Medication List       Accurate as of November 15, 2019 12:01 PM. If you have any questions, ask your nurse or doctor.        allopurinol 300 MG tablet Commonly known as: ZYLOPRIM Take 1/2 (one-half) tablet by mouth once daily   atorvastatin 80 MG tablet Commonly known as: LIPITOR Take 0.5 tablets (40 mg total) by mouth daily.   Basaglar KwikPen 100 UNIT/ML Inject 0.25 mLs (25 Units total) into the skin daily.   cloNIDine 0.1 MG tablet Commonly known as: CATAPRES Take 1 tablet (0.1 mg total) by mouth 2 (two) times daily.   clopidogrel 75 MG tablet Commonly known as: PLAVIX Take 1 tablet (75 mg total) by mouth daily.   DULoxetine 30 MG capsule Commonly known as: Cymbalta Take 1 capsule (30 mg total) by mouth daily.   Fish Oil 1000 MG Cpdr Take 1 tablet by mouth daily.   furosemide 40 MG tablet Commonly known as: LASIX Take 1 tablet (40 mg total) by mouth 2 (two) times daily.   gabapentin 300 MG capsule Commonly known as: NEURONTIN Take 1 capsule (300 mg total) by mouth 2 (two) times daily.   glucose blood test strip Commonly known as: Accu-Chek Guide 1 each by Other route 5 (five) times daily. Use as instructed   Insulin Pen Needle 29G X 12.7MM Misc Use ti inject insulin 5 times a day   insulin regular 100 units/mL injection Commonly known as: HumuLIN R Inject 0.3 mLs (  30 Units total) into the skin 2 (two) times daily before a meal.   metoprolol succinate 50 MG 24 hr tablet Commonly known as: TOPROL-XL Take 1 tablet (50 mg total) by mouth daily.   nitroGLYCERIN 0.4 MG SL tablet Commonly known as: NITROSTAT Place 0.4 mg under the tongue every 5 (five) minutes as needed for chest pain.   polyethylene glycol powder 17 GM/SCOOP powder Commonly known as: GLYCOLAX/MIRALAX Take 17 g by mouth 2 (two) times daily as needed.   quinapril 40 MG tablet Commonly known as: ACCUPRIL TAKE 1 TABLET BY MOUTH  AT BEDTIME   [employer] Insulin Syringe 31G X 15/64" 1 ML Misc Generic drug: Insulin Syringe-Needle U-100 Use to inject 5 times daily Dx E11.42   VITAMIN B 12 PO Take 1,000 mcg by mouth daily.        Objective:   BP 131/64   Pulse 78   Temp 98.4 F (36.9 C)   Ht _0  (1.651 m)   Wt 226 lb 6 oz (102.7 kg)   SpO2 100%   BMI 37.67 kg/m   Wt Readings from Last 3 Encounters:  11/15/19 226 lb 6 oz (102.7 kg)  07/04/19 217 lb (98.4 kg)  06/26/19 229 lb 3.2 oz (104 kg)    Physical Exam Vitals and nursing note reviewed.  Constitutional:      General: He is not in acute distress.    Appearance: He is well-developed. He is not diaphoretic.  Eyes:     General: No scleral icterus.    Conjunctiva/sclera: Conjunctivae normal.  Neck:     Thyroid: No thyromegaly.  Cardiovascular:     Rate and Rhythm: Normal rate and regular rhythm.     Heart sounds: Normal heart sounds. No murmur.  Pulmonary:     Effort: Pulmonary effort is normal. No respiratory distress.     Breath sounds: Normal breath sounds. No wheezing.  Musculoskeletal:        General: Normal range of motion.     Cervical back: Neck supple.  Lymphadenopathy:     Cervical: No cervical adenopathy.  Skin:    General: Skin is warm and dry.     Findings: No rash.  Neurological:     Mental Status: He is alert and oriented to person, place, and time.     Coordination: Coordination normal.  Psychiatric:        Behavior: Behavior normal.       Assessment & Plan:   Problem List Items Addressed This Visit      Cardiovascular and Mediastinum   Essential hypertension   Relevant Orders   CMP14+EGFR (Completed)     Endocrine   Type 2 diabetes, uncontrolled, with neuropathy ([place]) - Primary   Relevant Orders   Bayer DCA Hb A1c Waived (Completed)   CBC with Differential/Platelet (Completed)   CMP14+EGFR (Completed)     Genitourinary   CKD (chronic kidney disease) stage 4, GFR 15-29 ml/min (HCC)   Relevant Orders    CMP14+EGFR (Completed)     Other   Hyperlipidemia (Chronic)   Relevant Orders   Lipid panel (Completed)      a1c 8.7 increase basaglar to 32 units daily Follow up plan: Return in about 3 months (around 02/15/2020), or if symptoms worsen or fail to improve, for dm.  Counseling provided for all of the vaccine components Orders Placed This Encounter  Procedures  . Bayer DCA Hb A1c Waived  . CBC with Differential/Platelet  . CMP14+EGFR  . Lipid panel  Caryl Pina, MD [place] [facility] 11/15/2019, 12:01 PM

## 2019-11-16 LAB — CBC WITH DIFFERENTIAL/PLATELET
Basophils Absolute: 0 10*3/uL (ref 0.0–0.2)
Basos: 0 %
EOS (ABSOLUTE): 0.2 10*3/uL (ref 0.0–0.4)
Eos: 2 %
Hematocrit: 42.3 % (ref 37.5–51.0)
Hemoglobin: 14.8 g/dL (ref 13.0–17.7)
Immature Grans (Abs): 0 10*3/uL (ref 0.0–0.1)
Immature Granulocytes: 0 %
Lymphocytes Absolute: 2.2 10*3/uL (ref 0.7–3.1)
Lymphs: 29 %
MCH: 31.6 pg (ref 26.6–33.0)
MCHC: 35 g/dL (ref 31.5–35.7)
MCV: 90 fL (ref 79–97)
Monocytes Absolute: 0.5 10*3/uL (ref 0.1–0.9)
Monocytes: 7 %
Neutrophils Absolute: 4.5 10*3/uL (ref 1.4–7.0)
Neutrophils: 62 %
Platelets: 186 10*3/uL (ref 150–450)
RBC: 4.69 x10E6/uL (ref 4.14–5.80)
RDW: 14 % (ref 11.6–15.4)
WBC: 7.4 10*3/uL (ref 3.4–10.8)

## 2019-11-16 LAB — CMP14+EGFR
ALT: 24 IU/L (ref 0–44)
AST: 28 IU/L (ref 0–40)
Albumin/Globulin Ratio: 1.7 (ref 1.2–2.2)
Albumin: 4.6 g/dL (ref 3.6–4.6)
Alkaline Phosphatase: 115 IU/L (ref 39–117)
BUN/Creatinine Ratio: 19 (ref 10–24)
BUN: 39 mg/dL — ABNORMAL HIGH (ref 8–27)
Bilirubin Total: 0.6 mg/dL (ref 0.0–1.2)
CO2: 25 mmol/L (ref 20–29)
Calcium: 9.5 mg/dL (ref 8.6–10.2)
Chloride: 95 mmol/L — ABNORMAL LOW (ref 96–106)
Creatinine, Ser: 2.1 mg/dL — ABNORMAL HIGH (ref 0.76–1.27)
GFR calc Af Amer: 32 mL/min/{1.73_m2} — ABNORMAL LOW (ref >59)
GFR calc non Af Amer: 28 mL/min/{1.73_m2} — ABNORMAL LOW (ref >59)
Globulin, Total: 2.7 g/dL (ref 1.5–4.5)
Glucose: 213 mg/dL — ABNORMAL HIGH (ref 65–99)
Potassium: 4.8 mmol/L (ref 3.5–5.2)
Sodium: 136 mmol/L (ref 134–144)
Total Protein: 7.3 g/dL (ref 6.0–8.5)

## 2019-11-16 LAB — LIPID PANEL
Chol/HDL Ratio: 4.2 ratio (ref 0.0–5.0)
Cholesterol, Total: 138 mg/dL (ref 100–199)
HDL: 33 mg/dL — ABNORMAL LOW (ref >39)
LDL Chol Calc (NIH): 62 mg/dL (ref 0–99)
Triglycerides: 269 mg/dL — ABNORMAL HIGH (ref 0–149)
VLDL Cholesterol Cal: 43 mg/dL — ABNORMAL HIGH (ref 5–40)

## 2019-11-17 ENCOUNTER — Other Ambulatory Visit: Payer: Self-pay | Admitting: Family Medicine

## 2019-11-17 DIAGNOSIS — Z8673 Personal history of transient ischemic attack (TIA), and cerebral infarction without residual deficits: Secondary | ICD-10-CM

## 2019-12-20 ENCOUNTER — Encounter: Payer: Self-pay | Admitting: Cardiology

## 2019-12-20 ENCOUNTER — Ambulatory Visit: Payer: Medicare Other | Admitting: Cardiology

## 2019-12-20 ENCOUNTER — Other Ambulatory Visit: Payer: Self-pay

## 2019-12-20 VITALS — BP 138/70 | HR 65 | Ht 65.0 in | Wt 227.0 lb

## 2019-12-20 DIAGNOSIS — I255 Ischemic cardiomyopathy: Secondary | ICD-10-CM

## 2019-12-20 DIAGNOSIS — I472 Ventricular tachycardia, unspecified: Secondary | ICD-10-CM

## 2019-12-20 DIAGNOSIS — I10 Essential (primary) hypertension: Secondary | ICD-10-CM | POA: Diagnosis not present

## 2019-12-20 DIAGNOSIS — Z951 Presence of aortocoronary bypass graft: Secondary | ICD-10-CM

## 2019-12-20 DIAGNOSIS — I5022 Chronic systolic (congestive) heart failure: Secondary | ICD-10-CM | POA: Diagnosis not present

## 2019-12-20 DIAGNOSIS — I2510 Atherosclerotic heart disease of native coronary artery without angina pectoris: Secondary | ICD-10-CM | POA: Diagnosis not present

## 2019-12-20 DIAGNOSIS — Z95810 Presence of automatic (implantable) cardiac defibrillator: Secondary | ICD-10-CM

## 2019-12-20 DIAGNOSIS — I4729 Other ventricular tachycardia: Secondary | ICD-10-CM

## 2019-12-20 NOTE — Patient Instructions (Signed)
Medication Instructions:  Your physician recommends that you continue on your current medications as directed. Please refer to the Current Medication list given to you today.  *If you need a refill on your cardiac medications before your next appointment, please call your pharmacy*   Lab Work: None ordered   If you have labs (blood work) drawn today and your tests are completely normal, you will receive your results only by: Marland Kitchen MyChart Message (if you have MyChart) OR . A paper copy in the mail If you have any lab test that is abnormal or we need to change your treatment, we will call you to review the results.   Testing/Procedures: Your physician has requested that you have an echocardiogram. Echocardiography is a painless test that uses sound waves to create images of your heart. It provides your doctor with information about the size and shape of your heart and how well your heart's chambers and valves are working. This procedure takes approximately one hour. There are no restrictions for this procedure.     Follow-Up: At [facility], you and your health needs are our priority.  As part of our continuing mission to provide you with exceptional heart care, we have created designated Provider Care Teams.  These Care Teams include your primary Cardiologist (physician) and Advanced Practice Providers (APPs -  Physician Assistants and Nurse Practitioners) who all work together to provide you with the care you need, when you need it.  We recommend signing up for the patient portal called "MyChart".  Sign up information is provided on this After Visit Summary.  MyChart is used to connect with patients for Virtual Visits (Telemedicine).  Patients are able to view lab/test results, encounter notes, upcoming appointments, etc.  Non-urgent messages can be sent to your provider as well.   To learn more about what you can do with MyChart, go to [email or web address].    Your next appointment:    6 month(s)  The format for your next appointment:   In Person  Provider:   Jenne Campus, MD   Other Instructions None
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
## 2019-12-21 ENCOUNTER — Ambulatory Visit (HOSPITAL_BASED_OUTPATIENT_CLINIC_OR_DEPARTMENT_OTHER)
Admission: RE | Admit: 2019-12-21 | Discharge: 2019-12-21 | Disposition: A | Discharge status: Home / Self Care | Payer: Medicare Other | Source: Ambulatory Visit | Attending: Cardiology | Admitting: Cardiology

## 2019-12-21 DIAGNOSIS — I255 Ischemic cardiomyopathy: Secondary | ICD-10-CM | POA: Diagnosis not present

## 2019-12-21 MED ORDER — PERFLUTREN LIPID MICROSPHERE
1.0000 mL | INTRAVENOUS | Status: AC | PRN
  Administered 2019-12-21: 3 mL via INTRAVENOUS
  Filled 2019-12-21: qty 10

## 2019-12-21 NOTE — Progress Notes (Signed)
  Echocardiogram 2D Echocardiogram has been performed.  Cardell Peach 12/21/2019, 2:11 PM

## 2020-01-02 ENCOUNTER — Other Ambulatory Visit: Payer: Self-pay | Admitting: Pharmacy Technician

## 2020-01-02 NOTE — Patient Outreach (Signed)
[facility])   01/02/2020  Manuel Kline Sep 04, 1931 [id number]    Follow up call placed to [facility] regarding patient assistance application(s) for Humulin R and Basaglar , Caryl Pina confirms patient has been approved as of 2/25 until 08/30/20. Both medications delivered to patients home on 11/07/2019.  Follow up:  -Will remove myself from care team  [name]. Chana Bode, [place] Certified Pharmacy Technician [employer]

## 2020-01-07 ENCOUNTER — Other Ambulatory Visit: Payer: Self-pay | Admitting: Family Medicine

## 2020-01-07 DIAGNOSIS — I5022 Chronic systolic (congestive) heart failure: Secondary | ICD-10-CM

## 2020-01-07 DIAGNOSIS — IMO0002 Reserved for concepts with insufficient information to code with codable children: Secondary | ICD-10-CM

## 2020-01-07 DIAGNOSIS — G629 Polyneuropathy, unspecified: Secondary | ICD-10-CM

## 2020-01-07 DIAGNOSIS — E1165 Type 2 diabetes mellitus with hyperglycemia: Secondary | ICD-10-CM

## 2020-01-07 DIAGNOSIS — Z8739 Personal history of other diseases of the musculoskeletal system and connective tissue: Secondary | ICD-10-CM

## 2020-01-15 ENCOUNTER — Other Ambulatory Visit: Payer: Self-pay | Admitting: *Deleted

## 2020-01-15 DIAGNOSIS — I5022 Chronic systolic (congestive) heart failure: Secondary | ICD-10-CM

## 2020-01-15 NOTE — Patient Outreach (Addendum)
[facility]) Care Management  Manuel Kline  01/15/2020   Manuel Kline Jun 07, 1932 [id number]  [place] telephone call to patient.  [place] compliance verified. Per patient he has not checked his blood sugars today. Patient stated that his fingers are sore. Patient has increased his basaglar to 32 units daily. A1C is 8.7 decreased from 8.9. Patient appetite is good. Per patient he is not in a routine exercise program. He works in his garden some. Patient has agreed to follow up outreach calls.   Encounter Medications:  Outpatient Encounter Medications as of 01/15/2020  Medication Sig Note  . allopurinol (ZYLOPRIM) 300 MG tablet TAKE 1/2 (ONE-HALF) TABLET BY MOUTH ONCE DAILY .   Marland Kitchen atorvastatin (LIPITOR) 80 MG tablet Take 0.5 tablets (40 mg total) by mouth daily.   . cloNIDine (CATAPRES) 0.1 MG tablet Take 1 tablet (0.1 mg total) by mouth 2 (two) times daily.   . clopidogrel (PLAVIX) 75 MG tablet Take 1 tablet by mouth once daily   . Cyanocobalamin (VITAMIN B 12 PO) Take 1,000 mcg by mouth daily.   . DULoxetine (CYMBALTA) 30 MG capsule Take 1 capsule by mouth once daily   . furosemide (LASIX) 40 MG tablet Take 1 tablet by mouth twice daily   . gabapentin (NEURONTIN) 300 MG capsule Take 1 capsule (300 mg total) by mouth 2 (two) times daily.   Marland Kitchen glucose blood (ACCU-CHEK GUIDE) test strip 1 each by Other route 5 (five) times daily. Use as instructed   . Insulin Glargine (BASAGLAR KWIKPEN) 100 UNIT/ML SOPN Inject 0.25 mLs (25 Units total) into the skin daily. 06/27/2019: Medication increased to 28 units   . Insulin Pen Needle 29G X 12.7MM MISC Use ti inject insulin 5 times a day   . insulin regular (HUMULIN R) 100 units/mL injection Inject 0.3 mLs (30 Units total) into the skin 2 (two) times daily before a meal.   . Insulin Syringe-Needle U-100 (RELION INSULIN SYRINGE) 31G X 15/64" 1 ML MISC Use to inject 5 times daily Dx D74.12   . Lancet Device MISC 1 each by Does not  apply route 4 (four) times daily.   . metoprolol succinate (TOPROL-XL) 50 MG 24 hr tablet Take 1 tablet (50 mg total) by mouth daily.   . nitroGLYCERIN (NITROSTAT) 0.4 MG SL tablet Place 0.4 mg under the tongue every 5 (five) minutes as needed for chest pain.   . Omega-3 Fatty Acids (FISH OIL) 1000 MG CPDR Take 1 tablet by mouth daily.   . polyethylene glycol powder (GLYCOLAX/MIRALAX) powder Take 17 g by mouth 2 (two) times daily as needed.   . quinapril (ACCUPRIL) 40 MG tablet TAKE 1 TABLET BY MOUTH AT BEDTIME    No facility-administered encounter medications on file as of 01/15/2020.    Functional Status:  In your present state of health, do you have any difficulty performing the following activities: 10/24/2019 07/25/2019  Hearing? Y Y  Comment patient has hearing aids hearing aids  Vision? Y N  Difficulty concentrating or making decisions? N N  Walking or climbing stairs? [name]  Comment patient uses a walker patient uses a walker  Dressing or bathing? N N  Doing errands, shopping? N N  Preparing Food and eating ? Y Y  Comment meals on wheels and wife and son helps prepare meals wife and son help with meals and now patient is getting meals on wheels from [employer]? N N  In the past  six months, have you accidently leaked urine? N N  Do you have problems with loss of bowel control? N -  Managing your Medications? N N  Comment - -  Managing your Finances? [name]  Comment His children help and thn pharmacy is helping with medications His children assist and [facility] pharmacy  Housekeeping or managing your Housekeeping? [name]  Comment wife and grandchildren help wife and grandchildren help  Some recent data might be hidden    Fall/Depression Screening: Fall Risk  11/15/2019 10/24/2019 07/25/2019  Falls in the past year? 0 1 0  Number falls in past yr: - 0 1  Injury with Fall? - 0 1  Comment - - bruising  Risk Factor Category  - - -  Risk for fall due to : - History of  fall(s);Impaired balance/gait;Impaired mobility History of fall(s);Impaired balance/gait;Impaired mobility  Risk for fall due to: Comment - - -  Follow up - Falls evaluation completed Falls evaluation completed;Falls prevention discussed  Comment - - -   PHQ 2/9 Scores 11/15/2019 10/24/2019 07/25/2019 04/20/2019 04/14/2019 03/27/2019 01/19/2019  PHQ - 2 Score 0 0 0 0 0 0 0  PHQ- 9 Score - - - - - - -   THN CM Care Plan Problem One     Most Recent Value  Care Plan Problem One  Knowledge Deficit in Self Management of Diabetes  Role Documenting the Problem One  [place] for Problem One  Active  THN Long Term Goal   Patient will see a decrease in his A1C from 8.7 within the next 90 days  THN Long Term Goal Start Date  01/15/20  Interventions for Problem One Long Term Goal  Rn discussed the importance of monitoring his blood sugars to keep them between 80-130 fasting to have an A1C 7 or below. RN will follow up for further discussion  THN CM Short Term Goal #2   Patient will verbalize the importance of monitoring his blood sugars closely and documenting within the next 30 days  Interventions for Short Term Goal #2  RN discussed with patient why he is not checking his blood sugars daily. RN sent patient some tips to help with the soreness. RN will follow up for improvement of sores and if patient is monitoring.  THN CM Short Term Goal #3  patient will verbalize receiving exercise booklet and trying some exercises within the next 30 days  THN CM Short Term Goal #3 Start Date  01/15/20  Interventions for Short Tern Goal #3  RN discussed exercise routine. Rn discussed simpleexercises that patient and wife can do together. RN will follow up for further discussion.       Assessment:  A1C decreased to 8.7 from 8.9 Patient does not check his blood sugars daily Appetite good Basaglar increased to 32 units daily   Plan:  RN sent Exercise Activity booklet RN discussed monitoring blood  sugars daily RN discussed patient is now getting his insulin RN discussed healthy eating and portion control RN will follow up outreach within the month of August  Sheba Whaling [place] Management [phone]

## 2020-01-19 ENCOUNTER — Other Ambulatory Visit: Payer: Self-pay | Admitting: *Deleted

## 2020-01-19 DIAGNOSIS — Z8739 Personal history of other diseases of the musculoskeletal system and connective tissue: Secondary | ICD-10-CM

## 2020-01-24 ENCOUNTER — Ambulatory Visit (INDEPENDENT_AMBULATORY_CARE_PROVIDER_SITE_OTHER): Payer: Medicare Other | Admitting: *Deleted

## 2020-01-24 DIAGNOSIS — R55 Syncope and collapse: Secondary | ICD-10-CM

## 2020-01-24 LAB — CUP PACEART REMOTE DEVICE CHECK
Battery Remaining Longevity: 70 mo
Battery Voltage: 3 V
Brady Statistic AP VP Percent: 0.19 %
Brady Statistic AP VS Percent: 90.05 %
Brady Statistic AS VP Percent: 0.01 %
Brady Statistic AS VS Percent: 9.75 %
Brady Statistic RA Percent Paced: 90.22 %
Brady Statistic RV Percent Paced: 0.19 %
Date Time Interrogation Session: 20210526072305
HighPow Impedance: 51 Ohm
HighPow Impedance: 67 Ohm
Implantable Lead Implant Date: 20030519
Implantable Lead Implant Date: 20030519
Implantable Lead Location: 753859
Implantable Lead Location: 753860
Implantable Lead Model: 158
Implantable Lead Model: 4087
Implantable Lead Serial Number: 115102
Implantable Lead Serial Number: 159999
Implantable Pulse Generator Implant Date: 20171114
Lead Channel Impedance Value: 399 Ohm
Lead Channel Impedance Value: 399 Ohm
Lead Channel Impedance Value: 399 Ohm
Lead Channel Impedance Value: 4047 Ohm
Lead Channel Impedance Value: 4047 Ohm
Lead Channel Impedance Value: 4047 Ohm
Lead Channel Pacing Threshold Amplitude: 0.875 V
Lead Channel Pacing Threshold Amplitude: 1.25 V
Lead Channel Pacing Threshold Pulse Width: 0.4 ms
Lead Channel Pacing Threshold Pulse Width: 0.4 ms
Lead Channel Sensing Intrinsic Amplitude: 11.75 mV
Lead Channel Sensing Intrinsic Amplitude: 11.75 mV
Lead Channel Sensing Intrinsic Amplitude: 5.5 mV
Lead Channel Sensing Intrinsic Amplitude: 5.5 mV
Lead Channel Setting Pacing Amplitude: 2.5 V
Lead Channel Setting Pacing Amplitude: 2.5 V
Lead Channel Setting Pacing Pulse Width: 0.4 ms
Lead Channel Setting Sensing Sensitivity: 0.3 mV

## 2020-01-25 NOTE — Progress Notes (Signed)
Remote ICD transmission.   

## 2020-02-02 DIAGNOSIS — G4733 Obstructive sleep apnea (adult) (pediatric): Secondary | ICD-10-CM | POA: Diagnosis not present

## 2020-02-16 ENCOUNTER — Other Ambulatory Visit: Payer: Self-pay

## 2020-02-16 ENCOUNTER — Encounter: Payer: Self-pay | Admitting: Family Medicine

## 2020-02-16 ENCOUNTER — Ambulatory Visit (INDEPENDENT_AMBULATORY_CARE_PROVIDER_SITE_OTHER): Payer: Medicare Other | Admitting: Family Medicine

## 2020-02-16 VITALS — BP 149/86 | HR 69 | Temp 98.7°F | Ht 65.0 in | Wt 228.0 lb

## 2020-02-16 DIAGNOSIS — E1140 Type 2 diabetes mellitus with diabetic neuropathy, unspecified: Secondary | ICD-10-CM | POA: Diagnosis not present

## 2020-02-16 DIAGNOSIS — Z23 Encounter for immunization: Secondary | ICD-10-CM

## 2020-02-16 DIAGNOSIS — E782 Mixed hyperlipidemia: Secondary | ICD-10-CM

## 2020-02-16 DIAGNOSIS — E1165 Type 2 diabetes mellitus with hyperglycemia: Secondary | ICD-10-CM

## 2020-02-16 DIAGNOSIS — IMO0002 Reserved for concepts with insufficient information to code with codable children: Secondary | ICD-10-CM

## 2020-02-16 DIAGNOSIS — N184 Chronic kidney disease, stage 4 (severe): Secondary | ICD-10-CM | POA: Diagnosis not present

## 2020-02-16 DIAGNOSIS — I10 Essential (primary) hypertension: Secondary | ICD-10-CM

## 2020-02-16 LAB — CBC WITH DIFFERENTIAL/PLATELET
Basophils Absolute: 0 10*3/uL (ref 0.0–0.2)
Basos: 0 %
EOS (ABSOLUTE): 0.2 10*3/uL (ref 0.0–0.4)
Eos: 2 %
Hematocrit: 39.1 % (ref 37.5–51.0)
Hemoglobin: 13.3 g/dL (ref 13.0–17.7)
Immature Grans (Abs): 0 10*3/uL (ref 0.0–0.1)
Immature Granulocytes: 0 %
Lymphocytes Absolute: 2.3 10*3/uL (ref 0.7–3.1)
Lymphs: 34 %
MCH: 30.5 pg (ref 26.6–33.0)
MCHC: 34 g/dL (ref 31.5–35.7)
MCV: 90 fL (ref 79–97)
Monocytes Absolute: 0.5 10*3/uL (ref 0.1–0.9)
Monocytes: 8 %
Neutrophils Absolute: 3.9 10*3/uL (ref 1.4–7.0)
Neutrophils: 56 %
Platelets: 167 10*3/uL (ref 150–450)
RBC: 4.36 x10E6/uL (ref 4.14–5.80)
RDW: 13.7 % (ref 11.6–15.4)
WBC: 7 10*3/uL (ref 3.4–10.8)

## 2020-02-16 LAB — CMP14+EGFR
ALT: 23 IU/L (ref 0–44)
AST: 25 IU/L (ref 0–40)
Albumin/Globulin Ratio: 2 (ref 1.2–2.2)
Albumin: 4.2 g/dL (ref 3.6–4.6)
Alkaline Phosphatase: 108 IU/L (ref 48–121)
BUN/Creatinine Ratio: 19 (ref 10–24)
BUN: 38 mg/dL — ABNORMAL HIGH (ref 8–27)
Bilirubin Total: 0.6 mg/dL (ref 0.0–1.2)
CO2: 22 mmol/L (ref 20–29)
Calcium: 8.9 mg/dL (ref 8.6–10.2)
Chloride: 100 mmol/L (ref 96–106)
Creatinine, Ser: 1.99 mg/dL — ABNORMAL HIGH (ref 0.76–1.27)
GFR calc Af Amer: 34 mL/min/{1.73_m2} — ABNORMAL LOW (ref >59)
GFR calc non Af Amer: 30 mL/min/{1.73_m2} — ABNORMAL LOW (ref >59)
Globulin, Total: 2.1 g/dL (ref 1.5–4.5)
Glucose: 230 mg/dL — ABNORMAL HIGH (ref 65–99)
Potassium: 4.4 mmol/L (ref 3.5–5.2)
Sodium: 138 mmol/L (ref 134–144)
Total Protein: 6.3 g/dL (ref 6.0–8.5)

## 2020-02-16 LAB — LIPID PANEL
Chol/HDL Ratio: 3.6 ratio (ref 0.0–5.0)
Cholesterol, Total: 122 mg/dL (ref 100–199)
HDL: 34 mg/dL — ABNORMAL LOW (ref >39)
LDL Chol Calc (NIH): 57 mg/dL (ref 0–99)
Triglycerides: 186 mg/dL — ABNORMAL HIGH (ref 0–149)
VLDL Cholesterol Cal: 31 mg/dL (ref 5–40)

## 2020-02-16 LAB — BAYER DCA HB A1C WAIVED: HB A1C (BAYER DCA - WAIVED): 8.7 % — ABNORMAL HIGH (ref <7.0)

## 2020-02-16 MED ORDER — TRULICITY 0.75 MG/0.5ML ~~LOC~~ SOAJ: 0.7500 mg | SUBCUTANEOUS | 3 refills | Status: DC

## 2020-02-16 NOTE — Progress Notes (Signed)
BP (!) 149/86   Pulse 69   Temp 98.7 F (37.1 C)   Ht 5' 5"  (1.651 m)   Wt 228 lb (103.4 kg)   SpO2 99%   BMI 37.94 kg/m    Subjective:   Patient ID: Manuel Kline, male    DOB: 1932-05-17, 86 y.o.   MRN: [id number]  HPI: Manuel Kline is a 86 y.o. male presenting on 02/16/2020 for Medical Management of Chronic Issues, Diabetes, and Chronic Kidney Disease   HPI Type 2 diabetes mellitus Patient comes in today for recheck of his diabetes. Patient has been currently taking Basaglar and Humulin R, 32 Basaglar and 30 3 times daily with Humulin R although he does admit that he frequently misses the lunchtime dose. Patient is currently on an ACE inhibitor/ARB. Patient has not seen an ophthalmologist this year. Patient denies any new issues with their feet. The symptom started onset as an adult neuropathy and CKD and CAD and hypertension and cholesterol ARE RELATED TO DM   Hyperlipidemia Patient is coming in for recheck of his hyperlipidemia. The patient is currently taking atorvastatin and fish oil. They deny any issues with myalgias or history of liver damage from it. They deny any focal numbness or weakness or chest pain.   Hypertension Patient is currently on quinapril and metoprolol and furosemide and clonidine, and their blood pressure today is 149/86, has nephrology to help with this. Patient denies any lightheadedness or dizziness. Patient denies headaches, blurred vision, chest pains, shortness of breath, or weakness. Denies any side effects from medication and is content with current medication.   Relevant past medical, surgical, family and social history reviewed and updated as indicated. Interim medical history since our last visit reviewed. Allergies and medications reviewed and updated.  Review of Systems  Constitutional: Negative for chills and fever.  Respiratory: Negative for shortness of breath and wheezing.   Cardiovascular: Negative for chest pain and leg swelling.    Musculoskeletal: Negative for back pain and gait problem.  Skin: Negative for rash.  Neurological: Positive for numbness. Negative for dizziness and weakness.  All other systems reviewed and are negative.   Per HPI unless specifically indicated above   Allergies as of 02/16/2020   No Known Allergies     Medication List       Accurate as of February 16, 2020 11:11 AM. If you have any questions, ask your nurse or doctor.        allopurinol 300 MG tablet Commonly known as: ZYLOPRIM TAKE 1/2 (ONE-HALF) TABLET BY MOUTH ONCE DAILY .   atorvastatin 80 MG tablet Commonly known as: LIPITOR Take 0.5 tablets (40 mg total) by mouth daily.   Basaglar KwikPen 100 UNIT/ML Inject 0.25 mLs (25 Units total) into the skin daily.   cloNIDine 0.1 MG tablet Commonly known as: CATAPRES Take 1 tablet (0.1 mg total) by mouth 2 (two) times daily.   clopidogrel 75 MG tablet Commonly known as: PLAVIX Take 1 tablet by mouth once daily   DULoxetine 30 MG capsule Commonly known as: CYMBALTA Take 1 capsule by mouth once daily   Fish Oil 1000 MG Cpdr Take 1 tablet by mouth daily.   furosemide 40 MG tablet Commonly known as: LASIX Take 1 tablet by mouth twice daily   gabapentin 300 MG capsule Commonly known as: NEURONTIN Take 1 capsule (300 mg total) by mouth 2 (two) times daily.   glucose blood test strip Commonly known as: Accu-Chek Guide 1 each by Other route  5 (five) times daily. Use as instructed   Insulin Pen Needle 29G X 12.7MM Misc Use ti inject insulin 5 times a day   insulin regular 100 units/mL injection Commonly known as: HumuLIN R Inject 0.3 mLs (30 Units total) into the skin 2 (two) times daily before a meal.   Lancet Device Misc 1 each by Does not apply route 4 (four) times daily.   metoprolol succinate 50 MG 24 hr tablet Commonly known as: TOPROL-XL Take 1 tablet (50 mg total) by mouth daily.   nitroGLYCERIN 0.4 MG SL tablet Commonly known as: NITROSTAT Place 0.4  mg under the tongue every 5 (five) minutes as needed for chest pain.   polyethylene glycol powder 17 GM/SCOOP powder Commonly known as: GLYCOLAX/MIRALAX Take 17 g by mouth 2 (two) times daily as needed.   quinapril 40 MG tablet Commonly known as: ACCUPRIL TAKE 1 TABLET BY MOUTH AT BEDTIME   ReliOn Insulin Syringe 31G X 15/64" 1 ML Misc Generic drug: Insulin Syringe-Needle U-100 Use to inject 5 times daily Dx E11.42   VITAMIN B 12 PO Take 1,000 mcg by mouth daily.        Objective:   BP (!) 149/86   Pulse 69   Temp 98.7 F (37.1 C)   Ht 5' 5"  (1.651 m)   Wt 228 lb (103.4 kg)   SpO2 99%   BMI 37.94 kg/m   Wt Readings from Last 3 Encounters:  02/16/20 228 lb (103.4 kg)  12/20/19 227 lb (103 kg)  11/15/19 226 lb 6 oz (102.7 kg)    Physical Exam Vitals and nursing note reviewed.  Constitutional:      General: He is not in acute distress.    Appearance: He is well-developed. He is not diaphoretic.  Eyes:     General: No scleral icterus.    Conjunctiva/sclera: Conjunctivae normal.  Neck:     Thyroid: No thyromegaly.  Cardiovascular:     Rate and Rhythm: Normal rate and regular rhythm.     Heart sounds: Normal heart sounds. No murmur heard.   Pulmonary:     Effort: Pulmonary effort is normal. No respiratory distress.     Breath sounds: Normal breath sounds. No wheezing.  Musculoskeletal:        General: Normal range of motion.     Cervical back: Neck supple.  Lymphadenopathy:     Cervical: No cervical adenopathy.  Skin:    General: Skin is warm and dry.     Findings: No rash.  Neurological:     Mental Status: He is alert and oriented to person, place, and time.     Coordination: Coordination normal.  Psychiatric:        Behavior: Behavior normal.       Assessment & Plan:   Problem List Items Addressed This Visit      Cardiovascular and Mediastinum   Essential hypertension   Relevant Orders   CMP14+EGFR     Endocrine   Type 2 diabetes,  uncontrolled, with neuropathy ([place]) - Primary   Relevant Medications   Dulaglutide (TRULICITY) 0.26 VZ/8.5YI SOPN   Other Relevant Orders   Bayer DCA Hb A1c Waived (Completed)     Genitourinary   CKD (chronic kidney disease) stage 4, GFR 15-29 ml/min (HCC)   Relevant Orders   CMP14+EGFR   Bayer DCA Hb A1c Waived (Completed)     Other   Hyperlipidemia (Chronic)   Relevant Orders   CMP14+EGFR    Other Visit Diagnoses  Need for Tdap vaccination       Relevant Orders   Tdap vaccine greater than or equal to 7yo IM (Completed)      Added Trulicity to patient's regiment and gave sample for Trulicity continue on Basaglar Humalog and we will see if we need to reduce the dose in the future.  His A1c was 8.7 so it is elevated.  Blood pressure slightly elevated, will monitor for now and also defer to nephrology for management as well Follow up plan: Return in about 3 months (around 05/18/2020), or if symptoms worsen or fail to improve, for Diabetes recheck.  Counseling provided for all of the vaccine components Orders Placed This Encounter  Procedures  . CMP14+EGFR  . Bayer [facility] Hb A1c [place], MD [facility] 02/16/2020, 11:11 AM

## 2020-02-19 DIAGNOSIS — N184 Chronic kidney disease, stage 4 (severe): Secondary | ICD-10-CM | POA: Diagnosis not present

## 2020-02-20 ENCOUNTER — Encounter (INDEPENDENT_AMBULATORY_CARE_PROVIDER_SITE_OTHER): Payer: Self-pay | Admitting: Ophthalmology

## 2020-02-20 ENCOUNTER — Other Ambulatory Visit: Payer: Self-pay

## 2020-02-20 ENCOUNTER — Ambulatory Visit (INDEPENDENT_AMBULATORY_CARE_PROVIDER_SITE_OTHER): Payer: Medicare Other | Admitting: Ophthalmology

## 2020-02-20 DIAGNOSIS — E113393 Type 2 diabetes mellitus with moderate nonproliferative diabetic retinopathy without macular edema, bilateral: Secondary | ICD-10-CM

## 2020-02-20 DIAGNOSIS — H43813 Vitreous degeneration, bilateral: Secondary | ICD-10-CM | POA: Insufficient documentation

## 2020-02-20 HISTORY — DX: Type 2 diabetes mellitus with moderate nonproliferative diabetic retinopathy without macular edema, bilateral: E11.3393

## 2020-02-20 HISTORY — DX: Vitreous degeneration, bilateral: H43.813

## 2020-02-20 NOTE — Assessment & Plan Note (Signed)
The nature of moderate nonproliferative diabetic retinopathy was discussed with the patient as well as the need for more frequent follow up to judge for progression. Good blood glucose, blood pressure, and serum lipid control was recommended as well as avoidance of smoking and maintenance of normal weight.  Close follow up with PCP encouraged. OU stable.

## 2020-02-20 NOTE — Assessment & Plan Note (Signed)

## 2020-02-20 NOTE — Progress Notes (Signed)
02/20/2020     CHIEF COMPLAINT Patient presents for Retina Follow Up   HISTORY OF PRESENT ILLNESS: Manuel Kline is a 86 y.o. male who presents to the clinic today for:   HPI    Retina Follow Up    Patient presents with  Diabetic Retinopathy.  In both eyes.  Duration of 6 months.  Since onset it is stable.          Comments    6 month follow up - FP OU Patient denies change in vision and overall has no complaints. LBS 90 /// A1C 8.7       Last edited by Gerda Diss on 02/20/2020  8:40 AM. (History)      Referring physician: Dettinger, Fransisca Kaufmann, [place] [id number]  HISTORICAL INFORMATION:   Selected notes from the MEDICAL RECORD NUMBER    Lab Results  Component Value Date   HGBA1C 8.7 (H) 02/16/2020     CURRENT MEDICATIONS: No current outpatient medications on file. (Ophthalmic Drugs)   No current facility-administered medications for this visit. (Ophthalmic Drugs)   Current Outpatient Medications (Other)  Medication Sig   allopurinol (ZYLOPRIM) 300 MG tablet TAKE 1/2 (ONE-HALF) TABLET BY MOUTH ONCE DAILY .   atorvastatin (LIPITOR) 80 MG tablet Take 0.5 tablets (40 mg total) by mouth daily.   cloNIDine (CATAPRES) 0.1 MG tablet Take 1 tablet (0.1 mg total) by mouth 2 (two) times daily.   clopidogrel (PLAVIX) 75 MG tablet Take 1 tablet by mouth once daily   Cyanocobalamin (VITAMIN B 12 PO) Take 1,000 mcg by mouth daily.   Dulaglutide (TRULICITY) 1.74 YC/1.4GY SOPN Inject 0.5 mLs (0.75 mg total) into the skin once a week.   DULoxetine (CYMBALTA) 30 MG capsule Take 1 capsule by mouth once daily   furosemide (LASIX) 40 MG tablet Take 1 tablet by mouth twice daily   gabapentin (NEURONTIN) 300 MG capsule Take 1 capsule (300 mg total) by mouth 2 (two) times daily.   glucose blood (ACCU-CHEK GUIDE) test strip 1 each by Other route 5 (five) times daily. Use as instructed   Insulin Glargine (BASAGLAR KWIKPEN) 100 UNIT/ML SOPN Inject 0.25  mLs (25 Units total) into the skin daily.   Insulin Pen Needle 29G X 12.7MM MISC Use ti inject insulin 5 times a day   insulin regular (HUMULIN R) 100 units/mL injection Inject 0.3 mLs (30 Units total) into the skin 2 (two) times daily before a meal.   Insulin Syringe-Needle U-100 (RELION INSULIN SYRINGE) 31G X 15/64" 1 ML MISC Use to inject 5 times daily Dx E11.42   Lancet Device MISC 1 each by Does not apply route 4 (four) times daily.   metoprolol succinate (TOPROL-XL) 50 MG 24 hr tablet Take 1 tablet (50 mg total) by mouth daily.   nitroGLYCERIN (NITROSTAT) 0.4 MG SL tablet Place 0.4 mg under the tongue every 5 (five) minutes as needed for chest pain.   Omega-3 Fatty Acids (FISH OIL) 1000 MG CPDR Take 1 tablet by mouth daily.   polyethylene glycol powder (GLYCOLAX/MIRALAX) powder Take 17 g by mouth 2 (two) times daily as needed.   quinapril (ACCUPRIL) 40 MG tablet TAKE 1 TABLET BY MOUTH AT BEDTIME   No current facility-administered medications for this visit. (Other)      REVIEW OF SYSTEMS:    ALLERGIES No Known Allergies  PAST MEDICAL HISTORY Past Medical History:  Diagnosis Date   Adenomatous colon polyp 2006   CAD (coronary  artery disease)    Calcium oxalate renal stones    Cardiomyopathy    Cataract    Diabetes ([place])    Erectile dysfunction    Erosive esophagitis    Hemorrhoids    HLD (hyperlipidemia)    HTN (hypertension)    Hypertensive heart disease without CHF 07/31/2011   ICD (implantable cardiac defibrillator) in place    ICD dual chamber in situ    Metabolic syndrome    Morbid obesity ([place])    Osteoarthritis    S/P CABG (coronary artery bypass graft) 11/02/2000   Sleep apnea    Past Surgical History:  Procedure Laterality Date   ABDOMINAL EXPLORATION SURGERY     BACK SURGERY     X'3   cardiac bypass     CARDIAC DEFIBRILLATOR PLACEMENT     CARPAL TUNNEL RELEASE     X2, bilateral   CATARACT EXTRACTION      COLONOSCOPY  06/20/2012   Procedure: COLONOSCOPY;  Surgeon: Sable Feil, MD;  Location: [facility];  Service: Endoscopy;  Laterality: N/A;   DOPPLER ECHOCARDIOGRAPHY  2003   EP IMPLANTABLE DEVICE N/A 07/14/2016   Procedure: ICD Generator Changeout;  Surgeon: Evans Lance, MD;  Location: [facility];  Service: Cardiovascular;  Laterality: N/A;   ESOPHAGOGASTRODUODENOSCOPY  06/20/2012   Procedure: ESOPHAGOGASTRODUODENOSCOPY (EGD);  Surgeon: Sable Feil, MD;  Location: [facility];  Service: Endoscopy;  Laterality: N/A;   EYE SURGERY     LAPAROTOMY     RETINOPATHY SURGERY Bilateral    rotator cuff surgery     left    FAMILY HISTORY Family History  Problem Relation Age of Onset   Diabetes Brother    Heart disease Father    Heart disease Mother    Diabetes Mother    Diabetes Brother    Diabetes Brother    Cancer Brother        baldder   Heart disease Brother    Diabetes Sister    Diabetes Sister    Kidney disease Sister        dialysis   Diabetes Sister    Diabetes Sister    Diabetes Sister    Throat cancer Paternal Uncle     SOCIAL HISTORY Social History   Tobacco Use   Smoking status: Never Smoker   Smokeless tobacco: Never Used  Scientific laboratory technician Use: Never used  Substance Use Topics   Alcohol use: No    Alcohol/week: 0.0 standard drinks   Drug use: No         OPHTHALMIC EXAM:  Base Eye Exam    Visual Acuity (Snellen - Linear)      Right Left   Dist [place] 20/20-1 20/25+2       Tonometry (Tonopen, 8:42 AM)      Right Left   Pressure 12 11       Pupils      Pupils Dark Light Shape React APD   Right PERRL 4 4 Round Minimal None   Left PERRL 4 4 Round Sluggish None       Visual Fields (Counting fingers)      Left Right    Full Full       Extraocular Movement      Right Left    Full Full       Neuro/Psych    Oriented x3: Yes   Mood/Affect: Normal       Dilation    Both eyes: 1.0%  Mydriacyl,  2.5% Phenylephrine @ 8:42 AM        Slit Lamp and Fundus Exam    External Exam      Right Left   External Normal Normal       Slit Lamp Exam      Right Left   Lids/Lashes Normal Normal   Conjunctiva/Sclera White and quiet White and quiet   Cornea Clear Clear   Anterior Chamber Deep and quiet Deep and quiet   Iris Round and reactive Round and reactive   Lens Posterior chamber intraocular lens Posterior chamber intraocular lens   Anterior Vitreous Normal Normal       Fundus Exam      Right Left   Posterior Vitreous Posterior vitreous detachment Posterior vitreous detachment   Disc Normal Normal   C/D Ratio 0.55 0.5   Macula no macular thickening, Microaneurysms, no exudates no macular thickening, Microaneurysms, no exudates   Vessels NPDR- Moderate NPDR- Moderate   Periphery Normal Normal          IMAGING AND PROCEDURES  Imaging and Procedures for 02/20/20  OCT, Retina - OU - Both Eyes       Right Eye Quality was good. Scan locations included subfoveal. Central Foveal Thickness: 297. Progression has been stable. Findings include normal observations, normal foveal contour.   Left Eye Quality was good. Scan locations included subfoveal. Central Foveal Thickness: 292. Progression has been stable. Findings include normal observations, normal foveal contour.   Notes Bilateral posterior vitreous detachment, with moderate nonproliferative diabetic retinopathy and no active maculopathy stable over time                ASSESSMENT/PLAN:  No problem-specific Assessment & Plan notes found for this encounter.      ICD-10-CM   1. Moderate nonproliferative diabetic retinopathy of both eyes without macular edema associated with type 2 diabetes mellitus (HCC)  X10.6269 OCT, Retina - OU - Both Eyes  2. Posterior vitreous detachment of both eyes  H43.813 OCT, Retina - OU - Both Eyes    1.  2.  3.  Ophthalmic Meds Ordered this visit:  No orders of the  defined types were placed in this encounter.      No follow-ups on file.  There are no Patient Instructions on file for this visit.   Explained the diagnoses, plan, and follow up with the patient and they expressed understanding.  Patient expressed understanding of the importance of proper follow up care.   Clent Demark Rhiley Tarver M.D. Diseases & Surgery of the Retina and Vitreous Retina & Diabetic [place] 02/20/20     Abbreviations: M myopia (nearsighted); A astigmatism; H hyperopia (farsighted); P presbyopia; Mrx spectacle prescription;  CTL contact lenses; OD right eye; OS left eye; OU both eyes  XT exotropia; ET esotropia; PEK punctate epithelial keratitis; PEE punctate epithelial erosions; DES dry eye syndrome; MGD meibomian gland dysfunction; ATs artificial tears; PFAT's preservative free artificial tears; [place] nuclear sclerotic cataract; PSC posterior subcapsular cataract; ERM epi-retinal membrane; PVD posterior vitreous detachment; RD retinal detachment; DM diabetes mellitus; DR diabetic retinopathy; NPDR non-proliferative diabetic retinopathy; PDR proliferative diabetic retinopathy; CSME clinically significant macular edema; DME diabetic macular edema; dbh dot blot hemorrhages; CWS cotton wool spot; POAG primary open angle glaucoma; C/D cup-to-disc ratio; HVF humphrey visual field; GVF goldmann visual field; OCT optical coherence tomography; IOP intraocular pressure; BRVO Branch retinal vein occlusion; CRVO central retinal vein occlusion; CRAO central retinal artery occlusion; BRAO branch retinal artery occlusion; RT retinal tear; SB scleral  buckle; PPV pars plana vitrectomy; VH Vitreous hemorrhage; PRP panretinal laser photocoagulation; IVK intravitreal kenalog; VMT vitreomacular traction; MH Macular hole;  NVD neovascularization of the disc; NVE neovascularization elsewhere; AREDS age related eye disease study; ARMD age related macular degeneration; POAG primary open angle glaucoma; EBMD  epithelial/anterior basement membrane dystrophy; ACIOL anterior chamber intraocular lens; IOL intraocular lens; PCIOL posterior chamber intraocular lens; Phaco/IOL phacoemulsification with intraocular lens placement; Startex photorefractive keratectomy; LASIK laser assisted in situ keratomileusis; HTN hypertension; DM diabetes mellitus; COPD chronic obstructive pulmonary disease

## 2020-03-05 ENCOUNTER — Encounter (INDEPENDENT_AMBULATORY_CARE_PROVIDER_SITE_OTHER): Payer: Self-pay

## 2020-03-05 DIAGNOSIS — E113393 Type 2 diabetes mellitus with moderate nonproliferative diabetic retinopathy without macular edema, bilateral: Secondary | ICD-10-CM | POA: Diagnosis not present

## 2020-03-05 DIAGNOSIS — H40013 Open angle with borderline findings, low risk, bilateral: Secondary | ICD-10-CM | POA: Diagnosis not present

## 2020-03-05 DIAGNOSIS — Z961 Presence of intraocular lens: Secondary | ICD-10-CM | POA: Diagnosis not present

## 2020-03-05 LAB — HM DIABETES EYE EXAM

## 2020-04-02 ENCOUNTER — Encounter: Payer: Self-pay | Admitting: Family

## 2020-04-02 ENCOUNTER — Other Ambulatory Visit: Payer: Self-pay

## 2020-04-02 ENCOUNTER — Ambulatory Visit (INDEPENDENT_AMBULATORY_CARE_PROVIDER_SITE_OTHER): Payer: Medicare Other | Admitting: Family

## 2020-04-02 VITALS — BP 133/61 | HR 79 | Temp 97.3°F | Ht 65.0 in | Wt 229.4 lb

## 2020-04-02 DIAGNOSIS — L89152 Pressure ulcer of sacral region, stage 2: Secondary | ICD-10-CM | POA: Diagnosis not present

## 2020-04-02 NOTE — Progress Notes (Signed)
   Subjective:    Patient ID: Manuel Kline, male    DOB: [birth year], 86 y.o.   MRN: [id number]  Chief Complaint  Patient presents with  . Rash    on buttocks x 3 months. Pt states it hurts does not itch, states he feel on it a year ago and it still hurts his tail bone    PT presents to the office today with a "rash" on his buttocks that he noticed over 3 months ago. He states his pain is becoming worse and hurts to sick on his tail boine.  Rash This is a new problem. The current episode started more than 1 month ago. The problem has been gradually worsening since onset. Location: buttocks. The rash is characterized by burning and pain. He was exposed to nothing. Past treatments include antibiotic cream. The treatment provided no relief.      Review of Systems  Skin: Positive for rash.  All other systems reviewed and are negative.      Objective:   Physical Exam Vitals reviewed.  Constitutional:      General: He is not in acute distress.    Appearance: He is well-developed.  HENT:     Head: Normocephalic.  Eyes:     General:        Right eye: No discharge.        Left eye: No discharge.     Pupils: Pupils are equal, round, and reactive to light.  Neck:     Thyroid: No thyromegaly.  Cardiovascular:     Rate and Rhythm: Normal rate and regular rhythm.     Heart sounds: Normal heart sounds. No murmur heard.   Pulmonary:     Effort: Pulmonary effort is normal. No respiratory distress.     Breath sounds: Normal breath sounds. No wheezing.  Abdominal:     General: Bowel sounds are normal. There is no distension.     Palpations: Abdomen is soft.     Tenderness: There is no abdominal tenderness.  Musculoskeletal:        General: No tenderness. Normal range of motion.     Cervical back: Normal range of motion and neck supple.  Skin:    General: Skin is warm and dry.     Findings: No erythema or rash.       Neurological:     Mental Status: He is alert and oriented to  person, place, and time.     Cranial Nerves: No cranial nerve deficit.     Deep Tendon Reflexes: Reflexes are normal and symmetric.  Psychiatric:        Behavior: Behavior normal.        Thought Content: Thought content normal.        Judgment: Judgment normal.    Mepilex dressing placed.   BP 133/61   Pulse 79   Temp (!) 97.3 F (36.3 C) (Temporal)   Ht 5\' 5"  (1.651 m)   Wt 229 lb 6.4 oz (104.1 kg)   SpO2 99%   BMI 38.17 kg/m        Assessment & Plan:  1. Pressure injury of sacral region, stage 2 ([place]) Change position every 2 hours  Keep Mepilex dressing on for 72 hours or if it become soiled RTO if symptoms worsen or do not improve    Evelina Dun, FNP

## 2020-04-02 NOTE — Patient Instructions (Signed)
Pressure Injury  A pressure injury is damage to the skin and underlying tissue that results from pressure being applied to an area of the body. It often affects people who must spend a long time in a bed or chair because of a medical condition. Pressure injuries usually occur:  Over bony parts of the body, such as the tailbone, shoulders, elbows, hips, heels, spine, ankles, and back of the head.  Under medical devices that make contact with the body, such as respiratory equipment, stockings, tubes, and splints. Pressure injuries start as reddened areas on the skin and can lead to pain and an open wound. What are the causes? This condition is caused by frequent or constant pressure to an area of the body. Decreased blood flow to the skin can eventually cause the skin tissue to die and break down, causing a wound. What increases the risk? You are more likely to develop this condition if you:  Are in the hospital or an extended care facility.  Are bedridden or in a wheelchair.  Have an injury or disease that keeps you from: ? Moving normally. ? Feeling pain or pressure.  Have a condition that: ? Makes you sleepy or less alert. ? Causes poor blood flow.  Need to wear a medical device.  Have poor control of your bladder or bowel functions (incontinence).  Have poor nutrition (malnutrition). If you are at risk for pressure injuries, your health care provider may recommend certain types of mattresses, mattress covers, pillows, cushions, or boots to help prevent them. These may include products filled with air, foam, gel, or sand. What are the signs or symptoms? Symptoms of this condition depend on the severity of the injury. Symptoms may include:  Red or dark areas of the skin.  Pain, warmth, or a change of skin texture.  Blisters.  An open wound. How is this diagnosed? This condition is diagnosed with a medical history and physical exam. You may also have tests, such as:  Blood  tests.  Imaging tests.  Blood flow tests. Your pressure injury will be staged based on its severity. Staging is based on:  The depth of the tissue injury, including whether there is exposure of muscle, bone, or tendon.  The cause of the pressure injury. How is this treated? This condition may be treated by:  Relieving or redistributing pressure on your skin. This includes: ? Frequently changing your position. ? Avoiding positions that caused the wound or that can make the wound worse. ? Using specific bed mattresses, chair cushions, or protective boots. ? Moving medical devices from an area of pressure, or placing padding between the skin and the device. ? Using foams, creams, or powders to prevent rubbing (friction) on the skin.  Keeping your skin clean and dry. This may include using a skin cleanser or skin barrier as told by your health care provider.  Cleaning your injury and removing any dead tissue from the wound (debridement).  Placing a bandage (dressing) over your injury.  Using medicines for pain or to prevent or treat infection. Surgery may be needed if other treatments are not working or if your injury is very deep. Follow these instructions at home: Wound care  Follow instructions from your health care provider about how to take care of your wound. Make sure you: ? Wash your hands with soap and water before and after you change your bandage (dressing). If soap and water are not available, use hand sanitizer. ? Change your dressing as told   by your health care provider.  Check your wound every day for signs of infection. Have a caregiver do this for you if you are not able. Check for: ? Redness, swelling, or increased pain. ? More fluid or blood. ? Warmth. ? Pus or a bad smell. Skin care  Keep your skin clean and dry. Gently pat your skin dry.  Do not rub or massage your skin.  You or a caregiver should check your skin every day for any changes in color or  any new blisters or sores (ulcers). Medicines  Take over-the-counter and prescription medicines only as told by your health care provider.  If you were prescribed an antibiotic medicine, take or apply it as told by your health care provider. Do not stop using the antibiotic even if your condition improves. Reducing and redistributing pressure  Do not lie or sit in one position for a long time. Move or change position every 1-2 hours, or as told by your health care provider.  Use pillows or cushions to reduce pressure. Ask your health care provider to recommend cushions or pads for you. General instructions   Eat a healthy diet that includes lots of protein.  Drink enough fluid to keep your urine pale yellow.  Be as active as you can every day. Ask your health care provider to suggest safe exercises or activities.  Do not abuse drugs or alcohol.  Do not use any products that contain nicotine or tobacco, such as cigarettes, e-cigarettes, and chewing tobacco. If you need help quitting, ask your health care provider.  Keep all follow-up visits as told by your health care provider. This is important. Contact a health care provider if:  You have: ? A fever or chills. ? Pain that is not helped by medicine. ? Any changes in skin color. ? New blisters or sores. ? Pus or a bad smell coming from your wound. ? Redness, swelling, or pain around your wound. ? More fluid or blood coming from your wound.  Your wound does not improve after 1-2 weeks of treatment. Summary  A pressure injury is damage to the skin and underlying tissue that results from pressure being applied to an area of the body.  Do not lie or sit in one position for a long time. Your health care provider may advise you to move or change position every 1-2 hours.  Follow instructions from your health care provider about how to take care of your wound.  Keep all follow-up visits as told by your health care provider. This  is important. This information is not intended to replace advice given to you by your health care provider. Make sure you discuss any questions you have with your health care provider. Document Revised: 03/16/2018 Document Reviewed: 03/16/2018 Elsevier Patient Education  2020 Elsevier Inc.  

## 2020-04-05 ENCOUNTER — Other Ambulatory Visit: Payer: Self-pay | Admitting: Family Medicine

## 2020-04-07 ENCOUNTER — Other Ambulatory Visit: Payer: Self-pay | Admitting: Family Medicine

## 2020-04-07 DIAGNOSIS — I5022 Chronic systolic (congestive) heart failure: Secondary | ICD-10-CM

## 2020-04-07 DIAGNOSIS — Z8673 Personal history of transient ischemic attack (TIA), and cerebral infarction without residual deficits: Secondary | ICD-10-CM

## 2020-04-07 DIAGNOSIS — Z8739 Personal history of other diseases of the musculoskeletal system and connective tissue: Secondary | ICD-10-CM

## 2020-04-07 DIAGNOSIS — E785 Hyperlipidemia, unspecified: Secondary | ICD-10-CM

## 2020-04-07 DIAGNOSIS — IMO0002 Reserved for concepts with insufficient information to code with codable children: Secondary | ICD-10-CM

## 2020-04-07 DIAGNOSIS — G629 Polyneuropathy, unspecified: Secondary | ICD-10-CM

## 2020-04-16 ENCOUNTER — Other Ambulatory Visit: Payer: Self-pay | Admitting: *Deleted

## 2020-04-16 DIAGNOSIS — K118 Other diseases of salivary glands: Secondary | ICD-10-CM | POA: Diagnosis not present

## 2020-04-16 NOTE — Patient Outreach (Signed)
[facility]) Care Management  [place]  04/16/2020   Manuel Kline 04/07/1932 [id number]  [place] telephone call to patient.  [employer] compliance verified.. Per patient he had gotten back from ENT. Patient stated that his neck is sore and he will be contacting his PCP. Patient fasting blood sugar is 130. A1C is 8.7. Patient is trying to eat healthy.Patient has not started an exercise routine. RN discussed with patient and wife about doing an exercise routine together. Patient and wife had agreed to further outreach calls.   Encounter Medications:  Outpatient Encounter Medications as of 04/16/2020  Medication Sig Note  . allopurinol (ZYLOPRIM) 300 MG tablet Take 1/2 (one-half) tablet by mouth once daily   . atorvastatin (LIPITOR) 80 MG tablet Take 1/2 (one-half) tablet by mouth once daily   . cloNIDine (CATAPRES) 0.1 MG tablet Take 1 tablet by mouth twice daily   . clopidogrel (PLAVIX) 75 MG tablet Take 1 tablet by mouth once daily   . Cyanocobalamin (VITAMIN B 12 PO) Take 1,000 mcg by mouth daily.   . Dulaglutide (TRULICITY) 6.28 BT/5.1VO SOPN Inject 0.5 mLs (0.75 mg total) into the skin once a week.   . DULoxetine (CYMBALTA) 30 MG capsule Take 1 capsule by mouth once daily   . furosemide (LASIX) 40 MG tablet Take 1 tablet by mouth twice daily   . gabapentin (NEURONTIN) 300 MG capsule Take 1 capsule by mouth twice daily   . glucose blood (ACCU-CHEK GUIDE) test strip 1 each by Other route 5 (five) times daily. Use as instructed   . Insulin Glargine (BASAGLAR KWIKPEN) 100 UNIT/ML SOPN Inject 0.25 mLs (25 Units total) into the skin daily. 06/27/2019: Medication increased to 28 units   . Insulin Pen Needle 29G X 12.7MM MISC Use ti inject insulin 5 times a day   . insulin regular (HUMULIN R) 100 units/mL injection Inject 0.3 mLs (30 Units total) into the skin 2 (two) times daily before a meal.   . Insulin Syringe-Needle U-100 (RELION INSULIN SYRINGE) 31G X 15/64" 1 ML  MISC Use to inject 5 times daily Dx H60.73   . Lancet Device MISC 1 each by Does not apply route 4 (four) times daily.   . metoprolol succinate (TOPROL-XL) 50 MG 24 hr tablet Take 1 tablet by mouth once daily   . nitroGLYCERIN (NITROSTAT) 0.4 MG SL tablet Place 0.4 mg under the tongue every 5 (five) minutes as needed for chest pain.   . Omega-3 Fatty Acids (FISH OIL) 1000 MG CPDR Take 1 tablet by mouth daily.   . polyethylene glycol powder (GLYCOLAX/MIRALAX) powder Take 17 g by mouth 2 (two) times daily as needed.   . quinapril (ACCUPRIL) 40 MG tablet TAKE 1 TABLET BY MOUTH AT BEDTIME    No facility-administered encounter medications on file as of 04/16/2020.    Functional Status:  In your present state of health, do you have any difficulty performing the following activities: 10/24/2019 07/25/2019  Hearing? Y Y  Comment patient has hearing aids hearing aids  Vision? Y N  Difficulty concentrating or making decisions? N N  Walking or climbing stairs? [name]  Comment patient uses a walker patient uses a walker  Dressing or bathing? N N  Doing errands, shopping? N N  Preparing Food and eating ? Y Y  Comment meals on wheels and wife and son helps prepare meals wife and son help with meals and now patient is getting meals on wheels from [employer]  Using the Toilet? N N  In the past six months, have you accidently leaked urine? N N  Do you have problems with loss of bowel control? N -  Managing your Medications? N N  Comment - -  Managing your Finances? [name]  Comment His children help and thn pharmacy is helping with medications His children assist and [facility] pharmacy  Housekeeping or managing your Housekeeping? [name]  Comment wife and grandchildren help wife and grandchildren help  Some recent data might be hidden    Fall/Depression Screening: Fall Risk  04/16/2020 04/02/2020 02/16/2020  Falls in the past year? 1 1 0  Number falls in past yr: 0 0 -  Injury with Fall? 0 0 -  Comment - - -    Risk Factor Category  - - -  Risk for fall due to : History of fall(s);Impaired balance/gait History of fall(s) -  Risk for fall due to: Comment - - -  Follow up Falls evaluation completed Education provided -  Comment - - -   PHQ 2/9 Scores 04/02/2020 02/16/2020 11/15/2019 10/24/2019 07/25/2019 04/20/2019 04/14/2019  PHQ - 2 Score 0 0 0 0 0 0 0  PHQ- 9 Score - - - - - - -   Goals Addressed              This Visit's Progress   .  patient goal of A1C 7or< (pt-stated)        CARE PLAN ENTRY (see longtitudinal plan of care for additional care plan information)  Objective:  Lab Results  Component Value Date   HGBA1C 8.7 (H) 02/16/2020 .   Lab Results  Component Value Date   CREATININE 1.99 (H) 02/16/2020   CREATININE 2.10 (H) 11/15/2019   CREATININE 2.11 (H) 04/14/2019 .   Marland Kitchen No results found for: EGFR  Current Barriers:  Marland Kitchen Knowledge Deficits related to basic Diabetes pathophysiology and self care/management  Case Manager Clinical Goal(s):  Over the next 90 days, patient will demonstrate improved adherence to prescribed treatment plan for diabetes self care/management as evidenced by:  Marland Kitchen Verbalize daily monitoring and recording of CBG within 90 days . Verbalize adherence to ADA/ carb modified diet within the next 90 days . Verbalize exercise 2 days/week . Verbalize adherence to prescribed medication regimen within the next 90 days   Interventions:  . Provided education to patient about basic DM disease process . Discussed plans with patient for ongoing care management follow up and provided patient with direct contact information for care management team . Reviewed scheduled/upcoming provider appointments including: PCP, flu shot and eye exam  Patient Self Care Activities:  . Self administers oral medications as prescribed . Attends all scheduled provider appointments . Checks blood sugars as prescribed and utilize hyper and hypoglycemia protocol as needed . Adheres to  prescribed ADA/carb modified  Initial goal documentation        Assessment:  Fasting blood sugar is 130 A1C 8.7 Patient has not started a routine exercise program Plan:  Provided educational material on Planning healthy meals Patient will adhere to appointment Patient will continue to check blood sugars as per ordered RN will follow up with further outreach within the month of November RN will send update assessment to PCP  [place] [facility] [phone]

## 2020-04-22 ENCOUNTER — Telehealth: Payer: Self-pay | Admitting: Family Medicine

## 2020-04-22 NOTE — Telephone Encounter (Signed)
Pt needs assistance on Dulaglutide (TRULICITY) 3.23 FT/7.3UK SOPN. He cannot afford the pharmacy [place]. Its about $400.

## 2020-04-22 NOTE — Telephone Encounter (Signed)
Patient and wife can make an appt to see me (30 min pharmacy clinic appt) They must bring proof of household income (husband and wife proof of income) We can get trulicity free through the drug company This process takes about 2-4 weeks just [facility]

## 2020-04-22 NOTE — Telephone Encounter (Signed)
Daughter aware and verbalizes understanding per dpr.  

## 2020-04-23 ENCOUNTER — Ambulatory Visit (INDEPENDENT_AMBULATORY_CARE_PROVIDER_SITE_OTHER): Payer: Medicare Other | Admitting: Pharmacist

## 2020-04-23 ENCOUNTER — Other Ambulatory Visit: Payer: Self-pay

## 2020-04-23 DIAGNOSIS — E119 Type 2 diabetes mellitus without complications: Secondary | ICD-10-CM

## 2020-04-24 ENCOUNTER — Ambulatory Visit (INDEPENDENT_AMBULATORY_CARE_PROVIDER_SITE_OTHER): Payer: Medicare Other | Admitting: *Deleted

## 2020-04-24 DIAGNOSIS — R55 Syncope and collapse: Secondary | ICD-10-CM | POA: Diagnosis not present

## 2020-04-25 LAB — CUP PACEART REMOTE DEVICE CHECK
Battery Remaining Longevity: 68 mo
Battery Voltage: 2.99 V
Brady Statistic AP VP Percent: 0.27 %
Brady Statistic AP VS Percent: 88.57 %
Brady Statistic AS VP Percent: 0.01 %
Brady Statistic AS VS Percent: 11.15 %
Brady Statistic RA Percent Paced: 88.81 %
Brady Statistic RV Percent Paced: 0.27 %
Date Time Interrogation Session: 20210826052825
HighPow Impedance: 49 Ohm
HighPow Impedance: 68 Ohm
Implantable Lead Implant Date: 20030519
Implantable Lead Implant Date: 20030519
Implantable Lead Location: 753859
Implantable Lead Location: 753860
Implantable Lead Model: 158
Implantable Lead Model: 4087
Implantable Lead Serial Number: 115102
Implantable Lead Serial Number: 159999
Implantable Pulse Generator Implant Date: 20171114
Lead Channel Impedance Value: 399 Ohm
Lead Channel Impedance Value: 399 Ohm
Lead Channel Impedance Value: 4047 Ohm
Lead Channel Impedance Value: 4047 Ohm
Lead Channel Impedance Value: 4047 Ohm
Lead Channel Impedance Value: 437 Ohm
Lead Channel Pacing Threshold Amplitude: 1.125 V
Lead Channel Pacing Threshold Amplitude: 1.25 V
Lead Channel Pacing Threshold Pulse Width: 0.4 ms
Lead Channel Pacing Threshold Pulse Width: 0.4 ms
Lead Channel Sensing Intrinsic Amplitude: 10.125 mV
Lead Channel Sensing Intrinsic Amplitude: 10.125 mV
Lead Channel Sensing Intrinsic Amplitude: 2.125 mV
Lead Channel Sensing Intrinsic Amplitude: 2.125 mV
Lead Channel Setting Pacing Amplitude: 2.5 V
Lead Channel Setting Pacing Amplitude: 2.5 V
Lead Channel Setting Pacing Pulse Width: 0.4 ms
Lead Channel Setting Sensing Sensitivity: 0.3 mV

## 2020-04-30 NOTE — Progress Notes (Signed)
Remote ICD transmission.   

## 2020-05-14 NOTE — Progress Notes (Signed)
    04/23/2020 Name: Manuel Kline MRN: [id number] DOB: February 12, 1932   S:  30 yOM presents for diabetes evaluation, education, and management Patient was referred and last seen by Primary Care Provider on 04/02/20  Insurance coverage/medication affordability: [place]  Patient reports adherence with medications. . Current diabetes medications include: basaglar, insulin R, starting GLP1 . Current hypertension medications include: metoprolol, quinapril, clonidine Goal 130/80 . Current hyperlipidemia medications include: atorvastatin   Patient denies hypoglycemic events.   Patient reported dietary habits: Eats 2-3 meals/day Discussed meal planning options and Plate method for healthy eating . Avoid sugary drinks and desserts . Incorporate balanced protein, non starchy veggies, 1 serving of carbohydrate with each meal . Increase water intake . 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   A/P:  Diabetes T2DM currently uncontrolled. Patient is able to verbalize appropriate hypoglycemia management plan. Patient is adherent with medication. Control is suboptimal due to diet/medications not optimized.  -Increased dose of GLP-1 Trulicity (generic name dulaglutide) to 1.5mg  sq weekly.  -patient currently on trulicity 0.75mg  sq weekly and tolerating well  -denies history of thyroid/medullary cancer  -application submitted for [name]-medication assistance for trulicity  -Decrease basal insulin to 25 units  -Decrease meal time  insulin to 25 units twice daily.  -Extensively discussed pathophysiology of diabetes, recommended lifestyle interventions, dietary effects on blood sugar control  -Counseled on s/sx of and management of hypoglycemia  -Next A1C anticipated 05/22/20.    Written patient instructions provided.  Total time in face to face counseling 30 minutes.   Follow up PCP Clinic Visit ON 05/22/20   Regina Eck, PharmD, BCPS Clinical Pharmacist, [place]  II Phone [phone]

## 2020-05-22 ENCOUNTER — Encounter: Payer: Self-pay | Admitting: Family Medicine

## 2020-05-22 ENCOUNTER — Other Ambulatory Visit: Payer: Self-pay

## 2020-05-22 ENCOUNTER — Telehealth: Payer: Self-pay | Admitting: Family Medicine

## 2020-05-22 ENCOUNTER — Ambulatory Visit (INDEPENDENT_AMBULATORY_CARE_PROVIDER_SITE_OTHER): Payer: Medicare Other | Admitting: Family Medicine

## 2020-05-22 VITALS — BP 121/70 | HR 60 | Temp 98.0°F | Ht 65.0 in | Wt 224.0 lb

## 2020-05-22 DIAGNOSIS — E1165 Type 2 diabetes mellitus with hyperglycemia: Secondary | ICD-10-CM | POA: Diagnosis not present

## 2020-05-22 DIAGNOSIS — Z23 Encounter for immunization: Secondary | ICD-10-CM | POA: Diagnosis not present

## 2020-05-22 DIAGNOSIS — E113393 Type 2 diabetes mellitus with moderate nonproliferative diabetic retinopathy without macular edema, bilateral: Secondary | ICD-10-CM | POA: Diagnosis not present

## 2020-05-22 DIAGNOSIS — E119 Type 2 diabetes mellitus without complications: Secondary | ICD-10-CM

## 2020-05-22 DIAGNOSIS — IMO0002 Reserved for concepts with insufficient information to code with codable children: Secondary | ICD-10-CM

## 2020-05-22 DIAGNOSIS — N184 Chronic kidney disease, stage 4 (severe): Secondary | ICD-10-CM

## 2020-05-22 DIAGNOSIS — I10 Essential (primary) hypertension: Secondary | ICD-10-CM

## 2020-05-22 DIAGNOSIS — E1140 Type 2 diabetes mellitus with diabetic neuropathy, unspecified: Secondary | ICD-10-CM

## 2020-05-22 DIAGNOSIS — I5022 Chronic systolic (congestive) heart failure: Secondary | ICD-10-CM

## 2020-05-22 DIAGNOSIS — E782 Mixed hyperlipidemia: Secondary | ICD-10-CM

## 2020-05-22 DIAGNOSIS — G4733 Obstructive sleep apnea (adult) (pediatric): Secondary | ICD-10-CM | POA: Diagnosis not present

## 2020-05-22 DIAGNOSIS — E1142 Type 2 diabetes mellitus with diabetic polyneuropathy: Secondary | ICD-10-CM

## 2020-05-22 LAB — BAYER DCA HB A1C WAIVED: HB A1C (BAYER DCA - WAIVED): 7.3 % — ABNORMAL HIGH (ref <7.0)

## 2020-05-22 MED ORDER — TRULICITY 1.5 MG/0.5ML ~~LOC~~ SOAJ: 1.5000 mg | SUBCUTANEOUS | 11 refills | Status: DC

## 2020-05-22 NOTE — Progress Notes (Signed)
BP 121/70   Pulse 60   Temp 98 F (36.7 C)   Ht 5' 5"  (1.651 m)   Wt 224 lb (101.6 kg)   SpO2 96%   BMI 37.28 kg/m    Subjective:   Patient ID: Manuel Kline, male    DOB: 06-Jun-1932, 86 y.o.   MRN: [id number]  HPI: Manuel Kline is a 86 y.o. male presenting on 05/22/2020 for Medical Management of Chronic Issues and Diabetes   HPI Type 2 diabetes mellitus Patient comes in today for recheck of his diabetes. Patient has been currently taking Humalog and Trulicity and Engineer, agricultural. Patient is currently on an ACE inhibitor/ARB. Patient has not seen an ophthalmologist this year. Patient denies any issues with their feet. The symptom started onset as an adult hypertension and hyperlipidemia and CKD and neuropathy and retinopathy ARE RELATED TO DM   Hypertension Patient is currently on clonidine and metoprolol and quinapril, and their blood pressure today is 121/70. Patient denies any lightheadedness or dizziness. Patient denies headaches, blurred vision, chest pains, shortness of breath, or weakness. Denies any side effects from medication and is content with current medication.   Hyperlipidemia Patient is coming in for recheck of his hyperlipidemia. The patient is currently taking fish oil.  And atorvastatin.. They deny any issues with myalgias or history of liver damage from it. They deny any focal numbness or weakness or chest pain.   Relevant past medical, surgical, family and social history reviewed and updated as indicated. Interim medical history since our last visit reviewed. Allergies and medications reviewed and updated.  Review of Systems  Constitutional: Negative for chills and fever.  Eyes: Negative for visual disturbance.  Respiratory: Negative for shortness of breath and wheezing.   Cardiovascular: Negative for chest pain and leg swelling.  Musculoskeletal: Negative for back pain and gait problem.  Skin: Negative for rash.  Neurological: Negative for dizziness, weakness and  light-headedness.  All other systems reviewed and are negative.   Per HPI unless specifically indicated above   Allergies as of 05/22/2020   No Known Allergies     Medication List       Accurate as of May 22, 2020 10:56 AM. If you have any questions, ask your nurse or doctor.        STOP taking these medications   Trulicity 7.35 HG/9.9ME Sopn Generic drug: Dulaglutide Replaced by: Trulicity 1.5 QA/8.3MH Sopn Stopped by: Fransisca Kaufmann Emmalynn Pinkham, MD     TAKE these medications   allopurinol 300 MG tablet Commonly known as: ZYLOPRIM Take 1/2 (one-half) tablet by mouth once daily   atorvastatin 80 MG tablet Commonly known as: LIPITOR Take 1/2 (one-half) tablet by mouth once daily   Basaglar KwikPen 100 UNIT/ML Inject 0.25 mLs (25 Units total) into the skin daily.   cloNIDine 0.1 MG tablet Commonly known as: CATAPRES Take 1 tablet by mouth twice daily   clopidogrel 75 MG tablet Commonly known as: PLAVIX Take 1 tablet by mouth once daily   DULoxetine 30 MG capsule Commonly known as: CYMBALTA Take 1 capsule by mouth once daily   Fish Oil 1000 MG Cpdr Take 1 tablet by mouth daily.   furosemide 40 MG tablet Commonly known as: LASIX Take 1 tablet by mouth twice daily   gabapentin 300 MG capsule Commonly known as: NEURONTIN Take 1 capsule by mouth twice daily   glucose blood test strip Commonly known as: Accu-Chek Guide 1 each by Other route 5 (five) times daily. Use as instructed  Insulin Pen Needle 29G X 12.7MM Misc Use ti inject insulin 5 times a day   insulin regular 100 units/mL injection Commonly known as: HumuLIN R Inject 0.3 mLs (30 Units total) into the skin 2 (two) times daily before a meal.   Lancet Device Misc 1 each by Does not apply route 4 (four) times daily.   metoprolol succinate 50 MG 24 hr tablet Commonly known as: TOPROL-XL Take 1 tablet by mouth once daily   nitroGLYCERIN 0.4 MG SL tablet Commonly known as: NITROSTAT Place 0.4  mg under the tongue every 5 (five) minutes as needed for chest pain.   polyethylene glycol powder 17 GM/SCOOP powder Commonly known as: GLYCOLAX/MIRALAX Take 17 g by mouth 2 (two) times daily as needed.   quinapril 40 MG tablet Commonly known as: ACCUPRIL TAKE 1 TABLET BY MOUTH AT BEDTIME   ReliOn Insulin Syringe 31G X 15/64" 1 ML Misc Generic drug: Insulin Syringe-Needle U-100 Use to inject 5 times daily Dx T70.17   Trulicity 1.5 BL/3.9QZ Sopn Generic drug: Dulaglutide Inject 1.5 mg into the skin once a week. Replaces: Trulicity 0.09 QZ/3.0QT Sopn Started by: Fransisca Kaufmann Farzana Koci, MD   VITAMIN B 12 PO Take 1,000 mcg by mouth daily.        Objective:   BP 121/70   Pulse 60   Temp 98 F (36.7 C)   Ht 5' 5"  (1.651 m)   Wt 224 lb (101.6 kg)   SpO2 96%   BMI 37.28 kg/m   Wt Readings from Last 3 Encounters:  05/22/20 224 lb (101.6 kg)  04/02/20 229 lb 6.4 oz (104.1 kg)  02/16/20 228 lb (103.4 kg)    Physical Exam Vitals and nursing note reviewed.  Constitutional:      General: He is not in acute distress.    Appearance: He is well-developed. He is not diaphoretic.  Eyes:     General: No scleral icterus.    Conjunctiva/sclera: Conjunctivae normal.  Neck:     Thyroid: No thyromegaly.  Cardiovascular:     Rate and Rhythm: Normal rate and regular rhythm.     Heart sounds: Normal heart sounds. No murmur heard.   Pulmonary:     Effort: Pulmonary effort is normal. No respiratory distress.     Breath sounds: Normal breath sounds. No wheezing.  Musculoskeletal:        General: Normal range of motion.     Cervical back: Neck supple.  Lymphadenopathy:     Cervical: No cervical adenopathy.  Skin:    General: Skin is warm and dry.     Findings: No rash.  Neurological:     Mental Status: He is alert and oriented to person, place, and time.     Coordination: Coordination normal.  Psychiatric:        Behavior: Behavior normal.       Assessment & Plan:    Problem List Items Addressed This Visit      Cardiovascular and Mediastinum   Chronic systolic heart failure (HCC) (Chronic)   Essential hypertension     Endocrine   Type 2 diabetes, uncontrolled, with neuropathy (HCC)   Relevant Medications   Dulaglutide (TRULICITY) 1.5 MA/2.6JF SOPN   Other Relevant Orders   BMP8+EGFR   Diabetic peripheral neuropathy (HCC)   Relevant Medications   Dulaglutide (TRULICITY) 1.5 HL/4.5GY SOPN   Moderate nonproliferative diabetic retinopathy of both eyes without macular edema associated with type 2 diabetes mellitus (HCC)   Relevant Medications   Dulaglutide (TRULICITY) 1.5  MG/0.5ML SOPN     Genitourinary   CKD (chronic kidney disease) stage 4, GFR 15-29 ml/min (HCC)     Other   Hyperlipidemia (Chronic)    Other Visit Diagnoses    Type 2 diabetes mellitus without complication, without long-term current use of insulin (HCC)    -  Primary   Relevant Medications   Dulaglutide (TRULICITY) 1.5 EH/2.1YY SOPN   Other Relevant Orders   Bayer DCA Hb A1c Waived   BMP8+EGFR      Patient's A1c is improved at 7.3, Trulicity.  He seems to be doing well with that.  Patient is much improved blood sugars.  Will help him with financial, given the number that he can call.  Will increase to 1.5 mg. Follow up plan: Return in about 3 months (around 08/21/2020), or if symptoms worsen or fail to improve, for 1 month appointment with Almyra Free in 3 months with me.  Counseling provided for all of the vaccine components Orders Placed This Encounter  Procedures  . Bayer DCA Hb A1c Waived  . BMP8+EGFR    Caryl Pina, MD [facility] 05/22/2020, 10:56 AM

## 2020-05-22 NOTE — Telephone Encounter (Signed)
Spoke with pt, wants [place] to call back tomorrow and give number to grandson.

## 2020-05-22 NOTE — Telephone Encounter (Signed)
I re faxed Trulicity paperwork to [facility] for patient assistance.  I have called company today.  They did not get initial paperwork.  Please have patient call [name] [phone]) in 1-2 days to set up shipment for trulicity.  I will not be able to call to set up shipment for them.  The patient must do this.   Thank you!

## 2020-05-22 NOTE — Telephone Encounter (Signed)
Patients daughter aware

## 2020-05-23 LAB — BMP8+EGFR
BUN/Creatinine Ratio: 23 (ref 10–24)
BUN: 38 mg/dL — ABNORMAL HIGH (ref 8–27)
CO2: 27 mmol/L (ref 20–29)
Calcium: 9.8 mg/dL (ref 8.6–10.2)
Chloride: 97 mmol/L (ref 96–106)
Creatinine, Ser: 1.64 mg/dL — ABNORMAL HIGH (ref 0.76–1.27)
GFR calc Af Amer: 43 mL/min/{1.73_m2} — ABNORMAL LOW (ref >59)
GFR calc non Af Amer: 37 mL/min/{1.73_m2} — ABNORMAL LOW (ref >59)
Glucose: 137 mg/dL — ABNORMAL HIGH (ref 65–99)
Potassium: 5 mmol/L (ref 3.5–5.2)
Sodium: 137 mmol/L (ref 134–144)

## 2020-05-28 DIAGNOSIS — L570 Actinic keratosis: Secondary | ICD-10-CM | POA: Diagnosis not present

## 2020-05-28 DIAGNOSIS — L905 Scar conditions and fibrosis of skin: Secondary | ICD-10-CM | POA: Diagnosis not present

## 2020-05-28 DIAGNOSIS — Z85828 Personal history of other malignant neoplasm of skin: Secondary | ICD-10-CM | POA: Diagnosis not present

## 2020-05-28 DIAGNOSIS — L249 Irritant contact dermatitis, unspecified cause: Secondary | ICD-10-CM | POA: Diagnosis not present

## 2020-06-21 ENCOUNTER — Other Ambulatory Visit: Payer: Self-pay

## 2020-06-21 ENCOUNTER — Ambulatory Visit (INDEPENDENT_AMBULATORY_CARE_PROVIDER_SITE_OTHER): Payer: Medicare Other | Admitting: Pharmacist

## 2020-06-21 DIAGNOSIS — E119 Type 2 diabetes mellitus without complications: Secondary | ICD-10-CM

## 2020-06-21 NOTE — Progress Notes (Signed)
    06/21/2020 Name: Manuel Kline MRN: [id number] DOB: 06/04/1932   S:  79 yOM presents for diabetes evaluation, education, and management Patient was referred and last seen by Primary Care Provider on 05/22/20  Insurance coverage/medication affordability: [place]  Patient reports adherence with medications.  Current diabetes medications include: basaglar, insulin R, trulicity  Current hypertension medications include: metoprolol, quinapril, clonidine Goal 130/80  Current hyperlipidemia medications include: atorvastatin   Patient denies hypoglycemic events.   Patient reported dietary habits: Eats 2-3 meals/day Discussed meal planning options and Plate method for healthy eating  Avoid sugary drinks and desserts  Incorporate balanced protein, non starchy veggies, 1 serving of carbohydrate with each meal  Increase water intake  Increase physical activity as able  Patient-reported exercise habits: reports it is hard for him to get around  O:  Lab Results  Component Value Date   HGBA1C 7.3 (H) 05/22/2020    Lipid Panel     Component Value Date/Time   CHOL 122 02/16/2020 1048   TRIG 186 (H) 02/16/2020 1048   HDL 34 (L) 02/16/2020 1048   CHOLHDL 3.6 02/16/2020 1048   CHOLHDL 3.4 12/26/2018 0558   VLDL 36 12/26/2018 0558   LDLCALC 57 02/16/2020 1048     Home fasting blood sugars: 70-100s  2 hour post-meal/random blood sugars: 160  A/P:  Diabetes T2DM, A1c improved to 7.3.  Patient states FBG range between 70-100.  Would like to decrease insulin.  Patient is able to verbalize appropriate hypoglycemia management plan. He has multiple people watching out for him.  Discussed the importance of patient not missing meals.  Encouraged him to eat at least 2-3 times per day to avoid hypogylcemia.  Patient is adherent with medication.  Patient uses the [facility] for his brand name diabetes medications.  -Decreased dose of basal insulin BASAGLAR (insulin  GLARGINE) to 28 units.   -Continue Trulicity 1.5mg  sq weekly   -Continue meal time insulin (Humulin R).  Encouraged patient to track BGs.  We will likely decrease meal time Humulin R amount.  Encouraged patient to call with subsequent readings  -[place] Patient Assistance application filled out for patient to qualify him for next year 2022.  -Extensively discussed pathophysiology of diabetes, recommended lifestyle interventions, dietary effects on blood sugar control  -Counseled on s/sx of and management of hypoglycemia  -Next A1C anticipated 3-6 months.  Written patient instructions provided.  Total time in face to face counseling 25 minutes.   Follow up [facility] Visit in 3 months   Regina Eck, PharmD, BCPS Clinical Pharmacist, [place]  [facility] Phone [phone]

## 2020-07-01 ENCOUNTER — Encounter: Payer: Self-pay | Admitting: Internal Medicine

## 2020-07-01 ENCOUNTER — Other Ambulatory Visit: Payer: Self-pay

## 2020-07-01 ENCOUNTER — Other Ambulatory Visit: Payer: Self-pay | Admitting: Family Medicine

## 2020-07-01 ENCOUNTER — Ambulatory Visit: Payer: Medicare Other | Admitting: Internal Medicine

## 2020-07-01 VITALS — BP 136/80 | HR 78 | Ht 64.0 in | Wt 226.0 lb

## 2020-07-01 DIAGNOSIS — I5022 Chronic systolic (congestive) heart failure: Secondary | ICD-10-CM | POA: Diagnosis not present

## 2020-07-01 DIAGNOSIS — Z8739 Personal history of other diseases of the musculoskeletal system and connective tissue: Secondary | ICD-10-CM

## 2020-07-01 DIAGNOSIS — E1140 Type 2 diabetes mellitus with diabetic neuropathy, unspecified: Secondary | ICD-10-CM

## 2020-07-01 DIAGNOSIS — E785 Hyperlipidemia, unspecified: Secondary | ICD-10-CM

## 2020-07-01 DIAGNOSIS — Z95810 Presence of automatic (implantable) cardiac defibrillator: Secondary | ICD-10-CM | POA: Diagnosis not present

## 2020-07-01 DIAGNOSIS — I10 Essential (primary) hypertension: Secondary | ICD-10-CM | POA: Diagnosis not present

## 2020-07-01 DIAGNOSIS — I472 Ventricular tachycardia: Secondary | ICD-10-CM

## 2020-07-01 DIAGNOSIS — I255 Ischemic cardiomyopathy: Secondary | ICD-10-CM | POA: Diagnosis not present

## 2020-07-01 DIAGNOSIS — IMO0002 Reserved for concepts with insufficient information to code with codable children: Secondary | ICD-10-CM

## 2020-07-01 DIAGNOSIS — Z8673 Personal history of transient ischemic attack (TIA), and cerebral infarction without residual deficits: Secondary | ICD-10-CM

## 2020-07-01 DIAGNOSIS — G629 Polyneuropathy, unspecified: Secondary | ICD-10-CM

## 2020-07-01 DIAGNOSIS — I4729 Other ventricular tachycardia: Secondary | ICD-10-CM

## 2020-07-01 NOTE — Patient Instructions (Signed)

## 2020-07-01 NOTE — Progress Notes (Signed)
HPI [name] returns today for followup. He is a pleasant 86 yo man with a h/o HTN, CAD, s/p CABG, OSA, VT s/p ICD insertion. In the interim, he has been stable but does note some problems with peripheral neuropathy. No chest pain. No edema. He lost 5 lbs since his last visit. No Known Allergies   Current Outpatient Medications  Medication Sig Dispense Refill  . allopurinol (ZYLOPRIM) 300 MG tablet Take 1/2 (one-half) tablet by mouth once daily 45 tablet 0  . atorvastatin (LIPITOR) 80 MG tablet Take 1/2 (one-half) tablet by mouth once daily 45 tablet 0  . cloNIDine (CATAPRES) 0.1 MG tablet Take 1 tablet by mouth twice daily 180 tablet 0  . clopidogrel (PLAVIX) 75 MG tablet Take 1 tablet by mouth once daily 90 tablet 0  . Cyanocobalamin (VITAMIN B 12 PO) Take 1,000 mcg by mouth daily.    . Dulaglutide (TRULICITY) 1.5 ER/1.5QM SOPN Inject 1.5 mg into the skin once a week. 3 mL 11  . DULoxetine (CYMBALTA) 30 MG capsule Take 1 capsule by mouth once daily 90 capsule 0  . furosemide (LASIX) 40 MG tablet Take 1 tablet by mouth twice daily 180 tablet 0  . gabapentin (NEURONTIN) 300 MG capsule Take 1 capsule by mouth twice daily 180 capsule 0  . glucose blood (ACCU-CHEK GUIDE) test strip 1 each by Other route 5 (five) times daily. Use as instructed 400 each 11  . Insulin Glargine (BASAGLAR KWIKPEN) 100 UNIT/ML SOPN Inject 0.25 mLs (25 Units total) into the skin daily. (Patient taking differently: Inject 32 Units into the skin daily. )    . Insulin Pen Needle 29G X 12.7MM MISC Use ti inject insulin 5 times a day 150 each 3  . insulin regular (HUMULIN R) 100 units/mL injection Inject 0.3 mLs (30 Units total) into the skin 2 (two) times daily before a meal. (Patient taking differently: Inject 25 Units into the skin 2 (two) times daily before a meal. ) 60 mL 3  . Insulin Syringe-Needle U-100 (RELION INSULIN SYRINGE) 31G X 15/64" 1 ML MISC Use to inject 5 times daily Dx E11.42 500 each 3  . Lancet  Device MISC 1 each by Does not apply route 4 (four) times daily. 1 each 1  . metoprolol succinate (TOPROL-XL) 50 MG 24 hr tablet Take 1 tablet by mouth once daily 90 tablet 0  . nitroGLYCERIN (NITROSTAT) 0.4 MG SL tablet Place 0.4 mg under the tongue every 5 (five) minutes as needed for chest pain.    . Omega-3 Fatty Acids (FISH OIL) 1000 MG CPDR Take 1 tablet by mouth daily.    . polyethylene glycol powder (GLYCOLAX/MIRALAX) powder Take 17 g by mouth 2 (two) times daily as needed. 3350 g 1  . quinapril (ACCUPRIL) 40 MG tablet TAKE 1 TABLET BY MOUTH AT BEDTIME 90 tablet 3   No current facility-administered medications for this visit.     Past Medical History:  Diagnosis Date  . Adenomatous colon polyp 2006  . CAD (coronary artery disease)   . Calcium oxalate renal stones   . Cardiomyopathy   . Cataract   . Diabetes ([place])   . Erectile dysfunction   . Erosive esophagitis   . Hemorrhoids   . HLD (hyperlipidemia)   . HTN (hypertension)   . Hypertensive heart disease without CHF 07/31/2011  . ICD (implantable cardiac defibrillator) in place   . ICD dual chamber in situ   . Metabolic syndrome   .  Morbid obesity ([place])   . Osteoarthritis   . S/P CABG (coronary artery bypass graft) 11/02/2000  . Sleep apnea     ROS:   All systems reviewed and negative except as noted in the HPI.   Past Surgical History:  Procedure Laterality Date  . ABDOMINAL EXPLORATION SURGERY    . BACK SURGERY     X'3  . cardiac bypass    . CARDIAC DEFIBRILLATOR PLACEMENT    . CARPAL TUNNEL RELEASE     X2, bilateral  . CATARACT EXTRACTION    . COLONOSCOPY  06/20/2012   Procedure: COLONOSCOPY;  Surgeon: Sable Feil, MD;  Location: [facility] ENDOSCOPY;  Service: Endoscopy;  Laterality: N/A;  . DOPPLER ECHOCARDIOGRAPHY  2003  . EP IMPLANTABLE DEVICE N/A 07/14/2016   Procedure: ICD Generator Changeout;  Surgeon: [place] Lance, MD;  Location: [facility];  Service: Cardiovascular;  Laterality: N/A;  .  ESOPHAGOGASTRODUODENOSCOPY  06/20/2012   Procedure: ESOPHAGOGASTRODUODENOSCOPY (EGD);  Surgeon: Sable Feil, MD;  Location: [facility];  Service: Endoscopy;  Laterality: N/A;  . EYE SURGERY    . LAPAROTOMY    . RETINOPATHY SURGERY Bilateral   . rotator cuff surgery     left     Family History  Problem Relation Age of Onset  . Diabetes Brother   . Heart disease Father   . Heart disease Mother   . Diabetes Mother   . Diabetes Brother   . Diabetes Brother   . Cancer Brother        baldder  . Heart disease Brother   . Diabetes Sister   . Diabetes Sister   . Kidney disease Sister        dialysis  . Diabetes Sister   . Diabetes Sister   . Diabetes Sister   . Throat cancer Paternal Uncle      Social History   Socioeconomic History  . Marital status: Married    Spouse name: Alice  . Number of children: 4  . Years of education: 62  . Highest education level: 11th grade  Occupational History  . Occupation: Retired from [employer]: RETIRED  Tobacco Use  . Smoking status: Never Smoker  . Smokeless tobacco: Never Used  Vaping Use  . Vaping Use: Never used  Substance and Sexual Activity  . Alcohol use: No    Alcohol/week: 0.0 standard drinks  . Drug use: No  . Sexual activity: Yes  Other Topics Concern  . Not on file  Social History Narrative   Right handed, Married, 4 kids from previous marriage.  Retired.  Caffeine 1 cup daily.   Social Determinants of Health   Financial Resource Strain:   . Difficulty of Paying Living Expenses: Not on file  Food Insecurity: No Food Insecurity  . Worried About Charity fundraiser in the Last Year: Never true  . Ran Out of Food in the Last Year: Never true  Transportation Needs: No Transportation Needs  . Lack of Transportation (Medical): No  . Lack of Transportation (Non-Medical): No  Physical Activity:   . Days of Exercise per Week: Not on file  . Minutes of Exercise per Session: Not on file    Stress:   . Feeling of Stress : Not on file  Social Connections:   . Frequency of Communication with Friends and Family: Not on file  . Frequency of Social Gatherings with Friends and Family: Not on file  . Attends Religious Services: Not  on file  . Active Member of Clubs or Organizations: Not on file  . Attends Archivist Meetings: Not on file  . Marital Status: Not on file  Intimate Partner Violence:   . Fear of Current or Ex-Partner: Not on file  . Emotionally Abused: Not on file  . Physically Abused: Not on file  . Sexually Abused: Not on file     BP 136/80   Pulse 78   Ht 5\' 4"  (1.626 m)   Wt 226 lb (102.5 kg)   SpO2 98%   BMI 38.79 kg/m   Physical Exam:  Well appearing NAD HEENT: Unremarkable Neck:  No JVD, no thyromegally Lymphatics:  No adenopathy Back:  No CVA tenderness Lungs:  Clear with no wheezes HEART:  Regular rate rhythm, no murmurs, no rubs, no clicks Abd:  soft, positive bowel sounds, no organomegally, no rebound, no guarding Ext:  2 plus pulses, no edema, no cyanosis, no clubbing Skin:  No rashes no nodules Neuro:  CN II through XII intact, motor grossly intact  DEVICE  Normal device function.  See PaceArt for details.   Assess/Plan: 1. VT - he has had no recurrent ventricular arrhythmias. He will continue his current meds. 2. ICD - his medtronic ICD is working normally.  3. Chronic systolic heart failure - his symptoms remain class 2. He will continue his current meds. 4. Obesity - he has lost 5 lbs and I have encouraged him to lose 5 more.  Carleene Overlie Beckam Abdulaziz,MD

## 2020-07-03 LAB — CUP PACEART INCLINIC DEVICE CHECK
Battery Remaining Longevity: 67 mo
Battery Voltage: 2.97 V
Brady Statistic AP VP Percent: 0.18 %
Brady Statistic AP VS Percent: 88.36 %
Brady Statistic AS VP Percent: 0.01 %
Brady Statistic AS VS Percent: 11.44 %
Brady Statistic RA Percent Paced: 88.51 %
Brady Statistic RV Percent Paced: 0.18 %
Date Time Interrogation Session: 20211101171312
HighPow Impedance: 54 Ohm
HighPow Impedance: 76 Ohm
Implantable Lead Implant Date: 20030519
Implantable Lead Implant Date: 20030519
Implantable Lead Location: 753859
Implantable Lead Location: 753860
Implantable Lead Model: 158
Implantable Lead Model: 4087
Implantable Lead Serial Number: 115102
Implantable Lead Serial Number: 159999
Implantable Pulse Generator Implant Date: 20171114
Lead Channel Impedance Value: 4047 Ohm
Lead Channel Impedance Value: 4047 Ohm
Lead Channel Impedance Value: 4047 Ohm
Lead Channel Impedance Value: 437 Ohm
Lead Channel Impedance Value: 456 Ohm
Lead Channel Impedance Value: 456 Ohm
Lead Channel Pacing Threshold Amplitude: 0.75 V
Lead Channel Pacing Threshold Amplitude: 1 V
Lead Channel Pacing Threshold Pulse Width: 0.4 ms
Lead Channel Pacing Threshold Pulse Width: 0.4 ms
Lead Channel Sensing Intrinsic Amplitude: 11.25 mV
Lead Channel Sensing Intrinsic Amplitude: 12.75 mV
Lead Channel Sensing Intrinsic Amplitude: 2.25 mV
Lead Channel Sensing Intrinsic Amplitude: 2.375 mV
Lead Channel Setting Pacing Amplitude: 2.5 V
Lead Channel Setting Pacing Amplitude: 2.5 V
Lead Channel Setting Pacing Pulse Width: 0.4 ms
Lead Channel Setting Sensing Sensitivity: 0.3 mV

## 2020-07-17 ENCOUNTER — Other Ambulatory Visit: Payer: Self-pay | Admitting: *Deleted

## 2020-07-17 NOTE — Patient Instructions (Signed)
Goals Addressed            This Visit's Progress   . (THN)Monitor and Manage My Blood Sugar       Follow Up Date [id number]   - check blood sugar at prescribed times - check blood sugar if I feel it is too high or too low - enter blood sugar readings and medication or insulin into daily log - take the blood sugar log to all doctor visits - take the blood sugar meter to all doctor visits    Why is this important?   Checking your blood sugar at home helps to keep it from getting very high or very low.  Writing the results in a diary or log helps the doctor know how to care for you.  Your blood sugar log should have the time, date and the results.  Also, write down the amount of insulin or other medicine that you take.  Other information, like what you ate, exercise done and how you were feeling, will also be helpful.     Notes:     . [facility] Eye Exam       Follow Up Date [id number]   - keep appointment with eye doctor - schedule appointment with eye doctor    Why is this important?   Eye check-ups are important when you have diabetes.  Vision loss can be prevented.    Notes: [id number] last visit/ next vist [id number]    . (THN)Perform Foot Care       Follow Up Date [id number]   - check feet daily for cuts, sores or redness - trim toenails straight across - wash and dry feet carefully every day - wear comfortable, cotton socks - wear comfortable, well-fitting shoes    Why is this important?   Good foot care is very important when you have diabetes.  There are many things you can do to keep your feet healthy and catch a problem Notes:     . (THN)Set My Target A1C       Follow Up Date [id number]   - set target A1C    Why is this important?   Your target A1C is decided together by you and your doctor.  It is based on several things like your age and other health issues.    Notes:  7.0

## 2020-07-17 NOTE — Patient Outreach (Signed)
[facility]) Care Management  [place]  07/17/2020   Manuel Kline 09/16/1931 [id number]  RN Health Coach telephone call to patient.  [employer] compliance verified. Per patient he is doing good. Patient and wife on conference call. Patient fasting blood sugar is 107. A1C is 7.3. Patient had a recent fall into the door with no injuries. Per patient he staggers when ambulating and is using his cane now. He is not having any swelling in his lower extremities. He has scheduled his next eye exam. He has received his flu and COVID booster. Patient has agreed to follow up outreach calls.  Encounter Medications:  Outpatient Encounter Medications as of 07/17/2020  Medication Sig Note  . Dulaglutide (TRULICITY) 1.5 LG/9.2JJ SOPN Inject 1.5 mg into the skin once a week.   . furosemide (LASIX) 40 MG tablet Take 1 tablet by mouth twice daily   . glucose blood (ACCU-CHEK GUIDE) test strip 1 each by Other route 5 (five) times daily. Use as instructed   . Insulin Glargine (BASAGLAR KWIKPEN) 100 UNIT/ML SOPN Inject 0.25 mLs (25 Units total) into the skin daily. (Patient taking differently: Inject 32 Units into the skin daily. ) 06/27/2019: Medication increased to 28 units   . insulin regular (HUMULIN R) 100 units/mL injection Inject 0.3 mLs (30 Units total) into the skin 2 (two) times daily before a meal. (Patient taking differently: Inject 25 Units into the skin 2 (two) times daily before a meal. )   . allopurinol (ZYLOPRIM) 300 MG tablet Take 1/2 (one-half) tablet by mouth once daily   . atorvastatin (LIPITOR) 80 MG tablet Take 1/2 (one-half) tablet by mouth once daily   . cloNIDine (CATAPRES) 0.1 MG tablet Take 1 tablet by mouth twice daily   . clopidogrel (PLAVIX) 75 MG tablet Take 1 tablet by mouth once daily   . Cyanocobalamin (VITAMIN B 12 PO) Take 1,000 mcg by mouth daily.   . DULoxetine (CYMBALTA) 30 MG capsule Take 1 capsule by mouth once daily   . gabapentin (NEURONTIN) 300 MG  capsule Take 1 capsule by mouth twice daily   . Insulin Pen Needle 29G X 12.7MM MISC Use ti inject insulin 5 times a day   . Insulin Syringe-Needle U-100 (RELION INSULIN SYRINGE) 31G X 15/64" 1 ML MISC Use to inject 5 times daily Dx E11.42   . Lancet Device MISC 1 each by Does not apply route 4 (four) times daily.   . metoprolol succinate (TOPROL-XL) 50 MG 24 hr tablet Take 1 tablet by mouth once daily   . nitroGLYCERIN (NITROSTAT) 0.4 MG SL tablet Place 0.4 mg under the tongue every 5 (five) minutes as needed for chest pain.   . Omega-3 Fatty Acids (FISH OIL) 1000 MG CPDR Take 1 tablet by mouth daily.   . polyethylene glycol powder (GLYCOLAX/MIRALAX) powder Take 17 g by mouth 2 (two) times daily as needed.   . quinapril (ACCUPRIL) 40 MG tablet TAKE 1 TABLET BY MOUTH AT BEDTIME    No facility-administered encounter medications on file as of 07/17/2020.    Functional Status:  In your present state of health, do you have any difficulty performing the following activities: 10/24/2019 07/25/2019  Hearing? Y Y  Comment patient has hearing aids hearing aids  Vision? Y N  Difficulty concentrating or making decisions? N N  Walking or climbing stairs? [name]  Comment patient uses a walker patient uses a walker  Dressing or bathing? N N  Doing errands, shopping? N N  Preparing Food and eating ? Y Y  Comment meals on wheels and wife and son helps prepare meals wife and son help with meals and now patient is getting meals on wheels from [place]? N N  In the past six months, have you accidently leaked urine? N N  Do you have problems with loss of bowel control? N -  Managing your Medications? N N  Managing your Finances? [name]  Comment His children help and thn pharmacy is helping with medications His children assist and [facility] pharmacy  Housekeeping or managing your Housekeeping? [name]  Comment wife and grandchildren help wife and grandchildren help  Some recent data might be hidden     Fall/Depression Screening: Fall Risk  07/17/2020 05/22/2020 04/16/2020  Falls in the past year? 1 1 1   Number falls in past yr: 0 0 0  Injury with Fall? 0 0 0  Comment - - -  Risk Factor Category  - - -  Risk for fall due to : History of fall(s);Impaired balance/gait;Impaired mobility Impaired balance/gait;Impaired mobility;History of fall(s) History of fall(s);Impaired balance/gait  Risk for fall due to: Comment - - -  Follow up Falls evaluation completed Falls evaluation completed Falls evaluation completed  Comment - - -   PHQ 2/9 Scores 05/22/2020 04/02/2020 02/16/2020 11/15/2019 10/24/2019 07/25/2019 04/20/2019  PHQ - 2 Score 0 0 0 0 0 0 0  PHQ- 9 Score - - - - - - -    Assessment:  Goals Addressed            This Visit's Progress   . (THN)Monitor and Manage My Blood Sugar       Follow Up Date [id number]   - check blood sugar at prescribed times - check blood sugar if I feel it is too high or too low - enter blood sugar readings and medication or insulin into daily log - take the blood sugar log to all doctor visits - take the blood sugar meter to all doctor visits    Why is this important?   Checking your blood sugar at home helps to keep it from getting very high or very low.  Writing the results in a diary or log helps the doctor know how to care for you.  Your blood sugar log should have the time, date and the results.  Also, write down the amount of insulin or other medicine that you take.  Other information, like what you ate, exercise done and how you were feeling, will also be helpful.     Notes:     . [facility] Eye Exam       Follow Up Date [id number]   - keep appointment with eye doctor - schedule appointment with eye doctor    Why is this important?   Eye check-ups are important when you have diabetes.  Vision loss can be prevented.    Notes: [id number] last visit/ next vist [phone]    . (THN)Perform Foot Care       Follow Up Date [id number]   - check  feet daily for cuts, sores or redness - trim toenails straight across - wash and dry feet carefully every day - wear comfortable, cotton socks - wear comfortable, well-fitting shoes    Why is this important?   Good foot care is very important when you have diabetes.  There are many things you can do to keep your feet healthy and catch a problem Notes:     Marland Kitchen (  THN)Set My Target A1C       Follow Up Date [id number]   - set target A1C    Why is this important?   Your target A1C is decided together by you and your doctor.  It is based on several things like your age and other health issues.    Notes:  7.0       Plan:  Next eye exam scheduled [id number] Patient will monitor blood sugars as per ordered Patient will keep follow up appointments Discussed Holiday eating and portion control RN will follow up outreach within the month of February RN sent an update assessment to PCP  [place] [facility] [phone]

## 2020-07-22 DIAGNOSIS — H269 Unspecified cataract: Secondary | ICD-10-CM | POA: Insufficient documentation

## 2020-07-22 DIAGNOSIS — E119 Type 2 diabetes mellitus without complications: Secondary | ICD-10-CM | POA: Insufficient documentation

## 2020-07-22 DIAGNOSIS — G4730 Sleep apnea, unspecified: Secondary | ICD-10-CM | POA: Insufficient documentation

## 2020-07-22 DIAGNOSIS — M1990 Unspecified osteoarthritis, unspecified site: Secondary | ICD-10-CM | POA: Insufficient documentation

## 2020-07-22 DIAGNOSIS — E8881 Metabolic syndrome: Secondary | ICD-10-CM | POA: Insufficient documentation

## 2020-07-22 DIAGNOSIS — Z7984 Long term (current) use of oral hypoglycemic drugs: Secondary | ICD-10-CM | POA: Insufficient documentation

## 2020-07-22 DIAGNOSIS — K2210 Ulcer of esophagus without bleeding: Secondary | ICD-10-CM | POA: Insufficient documentation

## 2020-07-22 DIAGNOSIS — E785 Hyperlipidemia, unspecified: Secondary | ICD-10-CM | POA: Insufficient documentation

## 2020-07-22 DIAGNOSIS — K649 Unspecified hemorrhoids: Secondary | ICD-10-CM | POA: Insufficient documentation

## 2020-07-22 DIAGNOSIS — I10 Essential (primary) hypertension: Secondary | ICD-10-CM | POA: Insufficient documentation

## 2020-07-22 DIAGNOSIS — N529 Male erectile dysfunction, unspecified: Secondary | ICD-10-CM | POA: Insufficient documentation

## 2020-07-22 DIAGNOSIS — N200 Calculus of kidney: Secondary | ICD-10-CM | POA: Insufficient documentation

## 2020-07-23 ENCOUNTER — Ambulatory Visit: Payer: Medicare Other | Admitting: Cardiology

## 2020-07-23 ENCOUNTER — Encounter: Payer: Self-pay | Admitting: Cardiology

## 2020-07-23 ENCOUNTER — Other Ambulatory Visit: Payer: Self-pay

## 2020-07-23 VITALS — BP 102/68 | HR 62 | Ht 64.0 in | Wt 229.0 lb

## 2020-07-23 DIAGNOSIS — I119 Hypertensive heart disease without heart failure: Secondary | ICD-10-CM

## 2020-07-23 DIAGNOSIS — E782 Mixed hyperlipidemia: Secondary | ICD-10-CM | POA: Diagnosis not present

## 2020-07-23 DIAGNOSIS — N184 Chronic kidney disease, stage 4 (severe): Secondary | ICD-10-CM

## 2020-07-23 DIAGNOSIS — I255 Ischemic cardiomyopathy: Secondary | ICD-10-CM | POA: Diagnosis not present

## 2020-07-23 DIAGNOSIS — I739 Peripheral vascular disease, unspecified: Secondary | ICD-10-CM

## 2020-07-23 DIAGNOSIS — Z951 Presence of aortocoronary bypass graft: Secondary | ICD-10-CM | POA: Diagnosis not present

## 2020-07-23 HISTORY — DX: Peripheral vascular disease, unspecified: I73.9

## 2020-07-23 MED ORDER — CLONIDINE HCL 0.1 MG PO TABS: 0.1000 mg | ORAL_TABLET | Freq: Every day | ORAL | 2 refills | Status: DC

## 2020-07-23 NOTE — Addendum Note (Signed)
Addended by: Jerl Santos R on: 07/23/2020 09:51 AM   Modules accepted: Orders

## 2020-07-23 NOTE — Progress Notes (Signed)
Cardiology Office Note:    Date:  07/23/2020   ID:  QUIENTIN JENT, DOB 07-09-1932, MRN [id number]  PCP:  Dettinger, Fransisca Kaufmann, MD  Cardiologist:  Jenne Campus, MD    Referring MD: Dettinger, Fransisca Kaufmann, MD   Chief Complaint  Patient presents with  . Follow-up  I am doing fine  History of Present Illness:    AKIF WELDY is a 86 y.o. male with past medical history significant for coronary disease status post coronary bypass graft many years ago, diastolic congestive heart failure, history of ischemic cardiomyopathy however latest echocardiogram from last year showing ejection fraction 5055%, obstructive sleep apnea, ICD present, comes today 2 months of follow-up overall denies have any cardiac complaints.  Biggest complaint he has is the fact when he walks he will get pain in his thighs and calfs.  He said years ago he was evaluated for any circulation problem in his legs he was told everything is fine.  Past Medical History:  Diagnosis Date  . Adenomatous colon polyp 2006  . CAD (coronary artery disease)   . Calcium oxalate renal stones   . Cardiomyopathy   . Cataract   . Cerebral embolism with cerebral infarction 12/26/2018  . Chronic systolic heart failure ([place])        . CKD (chronic kidney disease) stage 4, GFR 15-29 ml/min (HCC) 11/08/2017  . Diabetes ([place])   . Diabetic peripheral neuropathy ([place]) 01/31/2015  . Dual implantable cardioverter-defibrillator in situ    01/16/2002 Dr. Lovena Le  RA lead  Guidant [id number] RV lead  Guidant [id number] Generator  Guidant Prism  09/02/2009 Generator change Medtronic [id number]  SN  [id number]     . Erectile dysfunction   . Erosive esophagitis   . Essential hypertension 11/30/2015  . Hemorrhoids   . History of TIA (transient ischemic attack) 01/19/2019  . HLD (hyperlipidemia)   . HTN (hypertension)   . Hyperlipidemia   . Hypertensive heart disease without CHF 07/31/2011  . ICD (implantable cardiac defibrillator) in place   . ICD dual chamber  in situ   . Ischemic cardiomyopathy    EF 40% June 2013   . Left-sided weakness 12/25/2018  . Metabolic syndrome   . Moderate nonproliferative diabetic retinopathy of both eyes without macular edema associated with type 2 diabetes mellitus ([place]) 02/20/2020  . Morbid obesity ([place])   . OSA on CPAP 11/05/2014  . Osteoarthritis   . Posterior vitreous detachment of both eyes 02/20/2020  . Presence of automatic (implantable) cardiac defibrillator 11/08/2017  . S/P CABG (coronary artery bypass graft) 11/02/2000  . Sleep apnea   . Stroke-like symptoms 12/26/2018  . Syncope 08/06/2009   Qualifier: Diagnosis of  By: Selena Batten CMA, [place]    . Type 2 diabetes, uncontrolled, with neuropathy ([place])    Has retinopahty and neuropathy    . Ventricular tachycardia (paroxysmal) ([place]) 07/14/2016    Past Surgical History:  Procedure Laterality Date  . ABDOMINAL EXPLORATION SURGERY    . BACK SURGERY     X'3  . cardiac bypass    . CARDIAC DEFIBRILLATOR PLACEMENT    . CARPAL TUNNEL RELEASE     X2, bilateral  . CATARACT EXTRACTION    . COLONOSCOPY  06/20/2012   Procedure: COLONOSCOPY;  Surgeon: Sable Feil, MD;  Location: [facility];  Service: Endoscopy;  Laterality: N/A;  . DOPPLER ECHOCARDIOGRAPHY  2003  . EP IMPLANTABLE DEVICE N/A 07/14/2016   Procedure: ICD Generator Changeout;  Surgeon: Evans Lance, MD;  Location: [facility];  Service: Cardiovascular;  Laterality: N/A;  . ESOPHAGOGASTRODUODENOSCOPY  06/20/2012   Procedure: ESOPHAGOGASTRODUODENOSCOPY (EGD);  Surgeon: Sable Feil, MD;  Location: [facility];  Service: Endoscopy;  Laterality: N/A;  . EYE SURGERY    . LAPAROTOMY    . RETINOPATHY SURGERY Bilateral   . rotator cuff surgery     left    Current Medications: Current Meds  Medication Sig  . allopurinol (ZYLOPRIM) 300 MG tablet Take 1/2 (one-half) tablet by mouth once daily  . atorvastatin (LIPITOR) 80 MG tablet Take 1/2 (one-half) tablet by mouth once daily  .  cloNIDine (CATAPRES) 0.1 MG tablet Take 1 tablet by mouth twice daily  . clopidogrel (PLAVIX) 75 MG tablet Take 1 tablet by mouth once daily  . Cyanocobalamin (VITAMIN B 12 PO) Take 1,000 mcg by mouth daily.  . Dulaglutide (TRULICITY) 1.5 JJ/0.0XF SOPN Inject 1.5 mg into the skin once a week.  . DULoxetine (CYMBALTA) 30 MG capsule Take 1 capsule by mouth once daily  . furosemide (LASIX) 40 MG tablet Take 1 tablet by mouth twice daily  . gabapentin (NEURONTIN) 300 MG capsule Take 1 capsule by mouth twice daily  . glucose blood (ACCU-CHEK GUIDE) test strip 1 each by Other route 5 (five) times daily. Use as instructed  . Insulin Glargine (BASAGLAR KWIKPEN) 100 UNIT/ML SOPN Inject 0.25 mLs (25 Units total) into the skin daily. (Patient taking differently: Inject 32 Units into the skin daily. )  . Insulin Pen Needle 29G X 12.7MM MISC Use ti inject insulin 5 times a day  . insulin regular (HUMULIN R) 100 units/mL injection Inject 0.3 mLs (30 Units total) into the skin 2 (two) times daily before a meal. (Patient taking differently: Inject 25 Units into the skin 2 (two) times daily before a meal. )  . Insulin Syringe-Needle U-100 (RELION INSULIN SYRINGE) 31G X 15/64" 1 ML MISC Use to inject 5 times daily Dx E11.42  . Lancet Device MISC 1 each by Does not apply route 4 (four) times daily.  . metoprolol succinate (TOPROL-XL) 50 MG 24 hr tablet Take 1 tablet by mouth once daily  . nitroGLYCERIN (NITROSTAT) 0.4 MG SL tablet Place 0.4 mg under the tongue every 5 (five) minutes as needed for chest pain.  . Omega-3 Fatty Acids (FISH OIL) 1000 MG CPDR Take 1 tablet by mouth daily.  . polyethylene glycol powder (GLYCOLAX/MIRALAX) powder Take 17 g by mouth 2 (two) times daily as needed.  . quinapril (ACCUPRIL) 40 MG tablet TAKE 1 TABLET BY MOUTH AT BEDTIME     Allergies:   Patient has no known allergies.   Social History   Socioeconomic History  . Marital status: Married    Spouse name: Alice  . Number of  children: 4  . Years of education: 5  . Highest education level: 11th grade  Occupational History  . Occupation: Retired from [employer]: RETIRED  Tobacco Use  . Smoking status: Never Smoker  . Smokeless tobacco: Never Used  Vaping Use  . Vaping Use: Never used  Substance and Sexual Activity  . Alcohol use: No    Alcohol/week: 0.0 standard drinks  . Drug use: No  . Sexual activity: Yes  Other Topics Concern  . Not on file  Social History Narrative   Right handed, Married, 4 kids from previous marriage.  Retired.  Caffeine 1 cup daily.   Social Determinants of Health   Financial Resource Strain:   .  Difficulty of Paying Living Expenses: Not on file  Food Insecurity: No Food Insecurity  . Worried About Charity fundraiser in the Last Year: Never true  . Ran Out of Food in the Last Year: Never true  Transportation Needs: No Transportation Needs  . Lack of Transportation (Medical): No  . Lack of Transportation (Non-Medical): No  Physical Activity:   . Days of Exercise per Week: Not on file  . Minutes of Exercise per Session: Not on file  Stress:   . Feeling of Stress : Not on file  Social Connections:   . Frequency of Communication with Friends and Family: Not on file  . Frequency of Social Gatherings with Friends and Family: Not on file  . Attends Religious Services: Not on file  . Active Member of Clubs or Organizations: Not on file  . Attends Archivist Meetings: Not on file  . Marital Status: Not on file     Family History: The patient's family history includes Cancer in his brother; Diabetes in his brother, brother, brother, mother, sister, sister, sister, sister, and sister; Heart disease in his brother, father, and mother; Kidney disease in his sister; Throat cancer in his paternal uncle. ROS:   Please see the history of present illness.    All 14 point review of systems negative except as described per history of present  illness  EKGs/Labs/Other Studies Reviewed:      Recent Labs: 02/16/2020: ALT 23; Hemoglobin 13.3; Platelets 167 05/22/2020: BUN 38; Creatinine, Ser 1.64; Potassium 5.0; Sodium 137  Recent Lipid Panel    Component Value Date/Time   CHOL 122 02/16/2020 1048   TRIG 186 (H) 02/16/2020 1048   HDL 34 (L) 02/16/2020 1048   CHOLHDL 3.6 02/16/2020 1048   CHOLHDL 3.4 12/26/2018 0558   VLDL 36 12/26/2018 0558   LDLCALC 57 02/16/2020 1048    Physical Exam:    VS:  BP 102/68 (BP Location: Left Arm, Patient Position: Sitting, Cuff Size: Normal)   Pulse 62   Ht 5\' 4"  (1.626 m)   Wt 229 lb (103.9 kg)   SpO2 98%   BMI 39.31 kg/m     Wt Readings from Last 3 Encounters:  07/23/20 229 lb (103.9 kg)  07/01/20 226 lb (102.5 kg)  05/22/20 224 lb (101.6 kg)     GEN:  Well nourished, well developed in no acute distress HEENT: Normal NECK: No JVD; No carotid bruits LYMPHATICS: No lymphadenopathy CARDIAC: RRR, no murmurs, no rubs, no gallops RESPIRATORY:  Clear to auscultation without rales, wheezing or rhonchi  ABDOMEN: Soft, non-tender, non-distended MUSCULOSKELETAL:  No edema; No deformity  SKIN: Warm and dry LOWER EXTREMITIES: no swelling, I do not feel dorsalis pedis and posterior tibial on his legs.  Capillary refill is quite decent NEUROLOGIC:  Alert and oriented x 3 PSYCHIATRIC:  Normal affect   ASSESSMENT:    1. Hypertensive heart disease without CHF   2. S/P CABG (coronary artery bypass graft)   3. Mixed hyperlipidemia   4. CKD (chronic kidney disease) stage 4, GFR 15-29 ml/min (HCC)   5. Ischemic cardiomyopathy    PLAN:    In order of problems listed above:  1. Hypertensive heart disease: Blood pressure actually is on the lower side we cut down his Catapres he takes 0.1 twice daily was only to once a day, 2. Coronary disease status post coronary bypass graft doing well asymptomatic continue present management. 3. Mixed dyslipidemia: He is on high intensity statin form of  Lipitor 80 which I will continue his K PN show me data from 02/16/2020 with LDL 57 HDL 34.  We will continue present management. 4. Chronic kidney failure followed by nephrology last number I see his creatinine 1.64 from May 22, 2020. 5. History ischemic cardiomyopathy however last echocardiogram showed low normal ejection fraction we will continue present management. 6. Claudication which is new symptoms.  Will schedule him to have arterial duplex of lower extremities.   Medication Adjustments/Labs and Tests Ordered: Current medicines are reviewed at length with the patient today.  Concerns regarding medicines are outlined above.  No orders of the defined types were placed in this encounter.  Medication changes: No orders of the defined types were placed in this encounter.   Signed, Park Liter, MD, [facility] 07/23/2020 9:29 AM    [place]

## 2020-07-23 NOTE — Patient Instructions (Signed)
Medication Instructions:  Your physician has recommended you make the following change in your medication:   Decrease:  Catapres 0.1 mg daily  *If you need a refill on your cardiac medications before your next appointment, please call your pharmacy*   Lab Work: None If you have labs (blood work) drawn today and your tests are completely normal, you will receive your results only by: Marland Kitchen MyChart Message (if you have MyChart) OR . A paper copy in the mail If you have any lab test that is abnormal or we need to change your treatment, we will call you to review the results.   Testing/Procedures: Your physician has requested that you have a lower extremity arterial exercise duplex. During this test, exercise and ultrasound are used to evaluate arterial blood flow in the legs. Allow one hour for this exam. There are no restrictions or special instructions.    Follow-Up: At [facility], you and your health needs are our priority.  As part of our continuing mission to provide you with exceptional heart care, we have created designated Provider Care Teams.  These Care Teams include your primary Cardiologist (physician) and Advanced Practice Providers (APPs -  Physician Assistants and Nurse Practitioners) who all work together to provide you with the care you need, when you need it.  We recommend signing up for the patient portal called "MyChart".  Sign up information is provided on this After Visit Summary.  MyChart is used to connect with patients for Virtual Visits (Telemedicine).  Patients are able to view lab/test results, encounter notes, upcoming appointments, etc.  Non-urgent messages can be sent to your provider as well.   To learn more about what you can do with MyChart, go to [email or web address].    Your next appointment:   6 month(s)  The format for your next appointment:   In Person  Provider:   Jenne Campus, MD   Other Instructions

## 2020-07-23 NOTE — Addendum Note (Signed)
Addended by: Senaida Ores on: 07/23/2020 09:36 AM   Modules accepted: Orders

## 2020-07-24 ENCOUNTER — Ambulatory Visit (INDEPENDENT_AMBULATORY_CARE_PROVIDER_SITE_OTHER): Payer: Medicare Other

## 2020-07-24 DIAGNOSIS — R55 Syncope and collapse: Secondary | ICD-10-CM

## 2020-07-24 LAB — CUP PACEART REMOTE DEVICE CHECK
Battery Remaining Longevity: 66 mo
Battery Voltage: 2.99 V
Brady Statistic AP VP Percent: 0.66 %
Brady Statistic AP VS Percent: 75.11 %
Brady Statistic AS VP Percent: 0.02 %
Brady Statistic AS VS Percent: 24.21 %
Brady Statistic RA Percent Paced: 75.74 %
Brady Statistic RV Percent Paced: 0.66 %
Date Time Interrogation Session: 20211124112713
HighPow Impedance: 51 Ohm
HighPow Impedance: 70 Ohm
Implantable Lead Implant Date: 20030519
Implantable Lead Implant Date: 20030519
Implantable Lead Location: 753859
Implantable Lead Location: 753860
Implantable Lead Model: 158
Implantable Lead Model: 4087
Implantable Lead Serial Number: 115102
Implantable Lead Serial Number: 159999
Implantable Pulse Generator Implant Date: 20171114
Lead Channel Impedance Value: 4047 Ohm
Lead Channel Impedance Value: 4047 Ohm
Lead Channel Impedance Value: 4047 Ohm
Lead Channel Impedance Value: 437 Ohm
Lead Channel Impedance Value: 494 Ohm
Lead Channel Impedance Value: 513 Ohm
Lead Channel Pacing Threshold Amplitude: 1 V
Lead Channel Pacing Threshold Amplitude: 1.375 V
Lead Channel Pacing Threshold Pulse Width: 0.4 ms
Lead Channel Pacing Threshold Pulse Width: 0.4 ms
Lead Channel Sensing Intrinsic Amplitude: 11.375 mV
Lead Channel Sensing Intrinsic Amplitude: 11.375 mV
Lead Channel Sensing Intrinsic Amplitude: 2.125 mV
Lead Channel Sensing Intrinsic Amplitude: 2.125 mV
Lead Channel Setting Pacing Amplitude: 2.5 V
Lead Channel Setting Pacing Amplitude: 2.5 V
Lead Channel Setting Pacing Pulse Width: 0.4 ms
Lead Channel Setting Sensing Sensitivity: 0.3 mV

## 2020-07-30 DIAGNOSIS — L821 Other seborrheic keratosis: Secondary | ICD-10-CM | POA: Diagnosis not present

## 2020-07-30 DIAGNOSIS — L570 Actinic keratosis: Secondary | ICD-10-CM | POA: Diagnosis not present

## 2020-07-31 DIAGNOSIS — N184 Chronic kidney disease, stage 4 (severe): Secondary | ICD-10-CM | POA: Diagnosis not present

## 2020-08-01 NOTE — Progress Notes (Signed)
Remote ICD transmission.   

## 2020-08-12 ENCOUNTER — Other Ambulatory Visit: Payer: Self-pay

## 2020-08-12 ENCOUNTER — Ambulatory Visit (HOSPITAL_BASED_OUTPATIENT_CLINIC_OR_DEPARTMENT_OTHER)
Admission: RE | Admit: 2020-08-12 | Discharge: 2020-08-12 | Disposition: A | Discharge status: Home / Self Care | Payer: Medicare Other | Source: Ambulatory Visit | Attending: Cardiology | Admitting: Cardiology

## 2020-08-12 DIAGNOSIS — I739 Peripheral vascular disease, unspecified: Secondary | ICD-10-CM | POA: Diagnosis not present

## 2020-08-20 ENCOUNTER — Encounter (INDEPENDENT_AMBULATORY_CARE_PROVIDER_SITE_OTHER): Payer: Self-pay | Admitting: Ophthalmology

## 2020-08-20 ENCOUNTER — Ambulatory Visit (INDEPENDENT_AMBULATORY_CARE_PROVIDER_SITE_OTHER): Payer: Medicare Other | Admitting: Ophthalmology

## 2020-08-20 ENCOUNTER — Other Ambulatory Visit: Payer: Self-pay

## 2020-08-20 DIAGNOSIS — E113393 Type 2 diabetes mellitus with moderate nonproliferative diabetic retinopathy without macular edema, bilateral: Secondary | ICD-10-CM

## 2020-08-20 DIAGNOSIS — Z961 Presence of intraocular lens: Secondary | ICD-10-CM

## 2020-08-20 DIAGNOSIS — H43813 Vitreous degeneration, bilateral: Secondary | ICD-10-CM

## 2020-08-20 NOTE — Assessment & Plan Note (Signed)
The nature of moderate nonproliferative diabetic retinopathy was discussed with the patient as well as the need for more frequent follow up to judge for progression. Good blood glucose, blood pressure, and serum lipid control was recommended as well as avoidance of smoking and maintenance of normal weight.  Close follow up with PCP encouraged. °

## 2020-08-20 NOTE — Assessment & Plan Note (Signed)

## 2020-08-20 NOTE — Progress Notes (Signed)
08/20/2020     CHIEF COMPLAINT Patient presents for Retina Follow Up (6 MO FU OU////Pt reports stable vision OU, no new f/f OU, no pain or pressure OU./////Last A1C: 7.3 taken 05/2020////Last BS: 160 this AM )   HISTORY OF PRESENT ILLNESS: Manuel Kline is a 86 y.o. male who presents to the clinic today for:   HPI    Retina Follow Up    Patient presents with  Diabetic Retinopathy.  In both eyes.  This started 6 months ago.  Severity is mild.  Duration of 6 months.  Since onset it is stable. Additional comments: 6 MO FU OU    Pt reports stable vision OU, no new f/f OU, no pain or pressure OU.     Last A1C: 7.3 taken 05/2020    Last BS: 160 this AM        Last edited by Nichola Sizer D on 08/20/2020  8:35 AM. (History)      Referring physician: Dettinger, Fransisca Kaufmann, MD [place],  [place] [address]  HISTORICAL INFORMATION:   Selected notes from the MEDICAL RECORD NUMBER    Lab Results  Component Value Date   HGBA1C 7.3 (H) 05/22/2020     CURRENT MEDICATIONS: No current outpatient medications on file. (Ophthalmic Drugs)   No current facility-administered medications for this visit. (Ophthalmic Drugs)   Current Outpatient Medications (Other)  Medication Sig  . allopurinol (ZYLOPRIM) 300 MG tablet Take 1/2 (one-half) tablet by mouth once daily  . atorvastatin (LIPITOR) 80 MG tablet Take 1/2 (one-half) tablet by mouth once daily  . cloNIDine (CATAPRES) 0.1 MG tablet Take 1 tablet (0.1 mg total) by mouth daily.  . clopidogrel (PLAVIX) 75 MG tablet Take 1 tablet by mouth once daily  . Cyanocobalamin (VITAMIN B 12 PO) Take 1,000 mcg by mouth daily.  . Dulaglutide (TRULICITY) 1.5 DQ/2.2WL SOPN Inject 1.5 mg into the skin once a week.  . DULoxetine (CYMBALTA) 30 MG capsule Take 1 capsule by mouth once daily  . furosemide (LASIX) 40 MG tablet Take 1 tablet by mouth twice daily  . gabapentin (NEURONTIN) 300 MG capsule Take 1 capsule by mouth twice daily  .  glucose blood (ACCU-CHEK GUIDE) test strip 1 each by Other route 5 (five) times daily. Use as instructed  . Insulin Glargine (BASAGLAR KWIKPEN) 100 UNIT/ML SOPN Inject 0.25 mLs (25 Units total) into the skin daily. (Patient taking differently: Inject 32 Units into the skin daily. )  . Insulin Pen Needle 29G X 12.7MM MISC Use ti inject insulin 5 times a day  . insulin regular (HUMULIN R) 100 units/mL injection Inject 0.3 mLs (30 Units total) into the skin 2 (two) times daily before a meal. (Patient taking differently: Inject 25 Units into the skin 2 (two) times daily before a meal. )  . Insulin Syringe-Needle U-100 (RELION INSULIN SYRINGE) 31G X 15/64" 1 ML MISC Use to inject 5 times daily Dx E11.42  . Lancet Device MISC 1 each by Does not apply route 4 (four) times daily.  . metoprolol succinate (TOPROL-XL) 50 MG 24 hr tablet Take 1 tablet by mouth once daily  . nitroGLYCERIN (NITROSTAT) 0.4 MG SL tablet Place 0.4 mg under the tongue every 5 (five) minutes as needed for chest pain.  . Omega-3 Fatty Acids (FISH OIL) 1000 MG CPDR Take 1 tablet by mouth daily.  . polyethylene glycol powder (GLYCOLAX/MIRALAX) powder Take 17 g by mouth 2 (two) times daily as needed.  . quinapril (  ACCUPRIL) 40 MG tablet TAKE 1 TABLET BY MOUTH AT BEDTIME   No current facility-administered medications for this visit. (Other)      REVIEW OF SYSTEMS:    ALLERGIES No Known Allergies  PAST MEDICAL HISTORY Past Medical History:  Diagnosis Date  . Adenomatous colon polyp 2006  . CAD (coronary artery disease)   . Calcium oxalate renal stones   . Cardiomyopathy   . Cataract   . Cataract   . Cerebral embolism with cerebral infarction 12/26/2018  . Chronic systolic heart failure ([place])        . CKD (chronic kidney disease) stage 4, GFR 15-29 ml/min (HCC) 11/08/2017  . Diabetes ([place])   . Diabetic peripheral neuropathy ([place]) 01/31/2015  . Dual implantable cardioverter-defibrillator in situ    01/16/2002 Dr. Lovena Le  RA  lead  Guidant [id number] RV lead  Guidant [id number] Generator  Guidant Prism  09/02/2009 Generator change Medtronic [id number]  SN  [id number] H     . Erectile dysfunction   . Erosive esophagitis   . Essential hypertension 11/30/2015  . Hemorrhoids   . History of TIA (transient ischemic attack) 01/19/2019  . HLD (hyperlipidemia)   . HTN (hypertension)   . Hyperlipidemia   . Hypertensive heart disease without CHF 07/31/2011  . ICD (implantable cardiac defibrillator) in place   . ICD dual chamber in situ   . Ischemic cardiomyopathy    EF 40% June 2013   . Left-sided weakness 12/25/2018  . Metabolic syndrome   . Moderate nonproliferative diabetic retinopathy of both eyes without macular edema associated with type 2 diabetes mellitus ([place]) 02/20/2020  . Morbid obesity ([place])   . OSA on CPAP 11/05/2014  . Osteoarthritis   . Posterior vitreous detachment of both eyes 02/20/2020  . Presence of automatic (implantable) cardiac defibrillator 11/08/2017  . S/P CABG (coronary artery bypass graft) 11/02/2000  . Sleep apnea   . Stroke-like symptoms 12/26/2018  . Syncope 08/06/2009   Qualifier: Diagnosis of  By: Selena Batten CMA, [place]    . Type 2 diabetes, uncontrolled, with neuropathy ([place])    Has retinopahty and neuropathy    . Ventricular tachycardia (paroxysmal) ([place]) 07/14/2016   Past Surgical History:  Procedure Laterality Date  . ABDOMINAL EXPLORATION SURGERY    . BACK SURGERY     X'3  . cardiac bypass    . CARDIAC DEFIBRILLATOR PLACEMENT    . CARPAL TUNNEL RELEASE     X2, bilateral  . CATARACT EXTRACTION    . COLONOSCOPY  06/20/2012   Procedure: COLONOSCOPY;  Surgeon: Sable Feil, MD;  Location: [facility];  Service: Endoscopy;  Laterality: N/A;  . DOPPLER ECHOCARDIOGRAPHY  2003  . EP IMPLANTABLE DEVICE N/A 07/14/2016   Procedure: ICD Generator Changeout;  Surgeon: Evans Lance, MD;  Location: [facility];  Service: Cardiovascular;  Laterality: N/A;  . ESOPHAGOGASTRODUODENOSCOPY   06/20/2012   Procedure: ESOPHAGOGASTRODUODENOSCOPY (EGD);  Surgeon: Sable Feil, MD;  Location: [facility];  Service: Endoscopy;  Laterality: N/A;  . EYE SURGERY    . LAPAROTOMY    . RETINOPATHY SURGERY Bilateral   . rotator cuff surgery     left    FAMILY HISTORY Family History  Problem Relation Age of Onset  . Diabetes Brother   . Heart disease Father   . Heart disease Mother   . Diabetes Mother   . Diabetes Brother   . Diabetes Brother   . Cancer Brother        baldder  .  Heart disease Brother   . Diabetes Sister   . Diabetes Sister   . Kidney disease Sister        dialysis  . Diabetes Sister   . Diabetes Sister   . Diabetes Sister   . Throat cancer Paternal Uncle     SOCIAL HISTORY Social History   Tobacco Use  . Smoking status: Never Smoker  . Smokeless tobacco: Never Used  Vaping Use  . Vaping Use: Never used  Substance Use Topics  . Alcohol use: No    Alcohol/week: 0.0 standard drinks  . Drug use: No         OPHTHALMIC EXAM: Base Eye Exam    Visual Acuity (ETDRS)      Right Left   Dist [place] 20/20 -2 20/25 -2       Tonometry (Tonopen, 8:40 AM)      Right Left   Pressure 18 14       Pupils      Pupils Dark Light Shape React APD   Right PERRL 4 4 Round Minimal None   Left PERRL 4 4 Round Sluggish None       Visual Fields (Counting fingers)      Left Right    Full Full       Extraocular Movement      Right Left    Full Full       Neuro/Psych    Oriented x3: Yes   Mood/Affect: Normal       Dilation    Both eyes: 1.0% Mydriacyl, 2.5% Phenylephrine @ 8:40 AM        Slit Lamp and Fundus Exam    External Exam      Right Left   External Normal Normal       Slit Lamp Exam      Right Left   Lids/Lashes Normal Normal   Conjunctiva/Sclera White and quiet White and quiet   Cornea Clear Clear   Anterior Chamber Deep and quiet Deep and quiet   Iris Round and reactive Round and reactive   Lens Posterior chamber intraocular  lens Posterior chamber intraocular lens   Anterior Vitreous Normal Normal       Fundus Exam      Right Left   Posterior Vitreous Posterior vitreous detachment Posterior vitreous detachment   Disc Normal Normal   C/D Ratio 0.65 0.6   Macula no macular thickening, Microaneurysms, no exudates no macular thickening, Microaneurysms, no exudates   Vessels NPDR- Moderate NPDR- Moderate   Periphery Normal Normal          IMAGING AND PROCEDURES  Imaging and Procedures for 08/20/20  OCT, Retina - OU - Both Eyes       Right Eye Quality was good. Scan locations included subfoveal. Central Foveal Thickness: 299. Findings include normal foveal contour.   Left Eye Quality was good. Scan locations included subfoveal. Central Foveal Thickness: 295. Progression has been stable. Findings include normal foveal contour.   Notes Stable macular findings OU incidental posterior vitreous detachment OU                ASSESSMENT/PLAN:  Moderate nonproliferative diabetic retinopathy of both eyes without macular edema associated with type 2 diabetes mellitus ([place]) The nature of moderate nonproliferative diabetic retinopathy was discussed with the patient as well as the need for more frequent follow up to judge for progression. Good blood glucose, blood pressure, and serum lipid control was recommended as well as avoidance of smoking and  maintenance of normal weight.  Close follow up with PCP encouraged.  Posterior vitreous detachment of both eyes   The nature of posterior vitreous detachment was discussed with the patient as well as its physiology, its age prevalence, and its possible implication regarding retinal breaks and detachment.  An informational brochure was given to the patient.  All the patient's questions were answered.  The patient was asked to return if new or different flashes or floaters develops.   Patient was instructed to contact office immediately if any changes were noticed. I  explained to the patient that vitreous inside the eye is similar to jello inside a bowl. As the jello melts it can start to pull away from the bowl, similarly the vitreous throughout our lives can begin to pull away from the retina. That process is called a posterior vitreous detachment. In some cases, the vitreous can tug hard enough on the retina to form a retinal tear. I discussed with the patient the signs and symptoms of a retinal detachment.  Do not rub the eye.      ICD-10-CM   1. Moderate nonproliferative diabetic retinopathy of both eyes without macular edema associated with type 2 diabetes mellitus (HCC)  J88.4166 OCT, Retina - OU - Both Eyes  2. Posterior vitreous detachment of both eyes  H43.813   3. Pseudophakia of both eyes  Z96.1     1.  No interval change in diabetic retinopathy OU for the last 6 months  2.  3.  Ophthalmic Meds Ordered this visit:  No orders of the defined types were placed in this encounter.      Return in about 6 months (around 02/18/2021) for DILATE OU, COLOR FP.  There are no Patient Instructions on file for this visit.   Explained the diagnoses, plan, and follow up with the patient and they expressed understanding.  Patient expressed understanding of the importance of proper follow up care.   Clent Demark Mccartney Chuba M.D. Diseases & Surgery of the Retina and Vitreous Retina & Diabetic [place] 08/20/20     Abbreviations: M myopia (nearsighted); A astigmatism; H hyperopia (farsighted); P presbyopia; Mrx spectacle prescription;  CTL contact lenses; OD right eye; OS left eye; OU both eyes  XT exotropia; ET esotropia; PEK punctate epithelial keratitis; PEE punctate epithelial erosions; DES dry eye syndrome; MGD meibomian gland dysfunction; ATs artificial tears; PFAT's preservative free artificial tears; [place] nuclear sclerotic cataract; PSC posterior subcapsular cataract; ERM epi-retinal membrane; PVD posterior vitreous detachment; RD retinal detachment; DM  diabetes mellitus; DR diabetic retinopathy; NPDR non-proliferative diabetic retinopathy; PDR proliferative diabetic retinopathy; CSME clinically significant macular edema; DME diabetic macular edema; dbh dot blot hemorrhages; CWS cotton wool spot; POAG primary open angle glaucoma; C/D cup-to-disc ratio; HVF humphrey visual field; GVF goldmann visual field; OCT optical coherence tomography; IOP intraocular pressure; BRVO Branch retinal vein occlusion; CRVO central retinal vein occlusion; CRAO central retinal artery occlusion; BRAO branch retinal artery occlusion; RT retinal tear; SB scleral buckle; PPV pars plana vitrectomy; VH Vitreous hemorrhage; PRP panretinal laser photocoagulation; IVK intravitreal kenalog; VMT vitreomacular traction; MH Macular hole;  NVD neovascularization of the disc; NVE neovascularization elsewhere; AREDS age related eye disease study; ARMD age related macular degeneration; POAG primary open angle glaucoma; EBMD epithelial/anterior basement membrane dystrophy; ACIOL anterior chamber intraocular lens; IOL intraocular lens; PCIOL posterior chamber intraocular lens; Phaco/IOL phacoemulsification with intraocular lens placement; [place] photorefractive keratectomy; LASIK laser assisted in situ keratomileusis; HTN hypertension; DM diabetes mellitus; COPD chronic obstructive pulmonary disease

## 2020-09-02 ENCOUNTER — Ambulatory Visit (INDEPENDENT_AMBULATORY_CARE_PROVIDER_SITE_OTHER): Payer: Medicare Other | Admitting: Family Medicine

## 2020-09-02 ENCOUNTER — Encounter: Payer: Self-pay | Admitting: Family Medicine

## 2020-09-02 ENCOUNTER — Other Ambulatory Visit: Payer: Self-pay

## 2020-09-02 DIAGNOSIS — E1140 Type 2 diabetes mellitus with diabetic neuropathy, unspecified: Secondary | ICD-10-CM

## 2020-09-02 DIAGNOSIS — E1142 Type 2 diabetes mellitus with diabetic polyneuropathy: Secondary | ICD-10-CM

## 2020-09-02 DIAGNOSIS — N184 Chronic kidney disease, stage 4 (severe): Secondary | ICD-10-CM | POA: Diagnosis not present

## 2020-09-02 DIAGNOSIS — E1165 Type 2 diabetes mellitus with hyperglycemia: Secondary | ICD-10-CM

## 2020-09-02 DIAGNOSIS — IMO0002 Reserved for concepts with insufficient information to code with codable children: Secondary | ICD-10-CM

## 2020-09-02 DIAGNOSIS — E113393 Type 2 diabetes mellitus with moderate nonproliferative diabetic retinopathy without macular edema, bilateral: Secondary | ICD-10-CM | POA: Diagnosis not present

## 2020-09-02 DIAGNOSIS — I10 Essential (primary) hypertension: Secondary | ICD-10-CM

## 2020-09-02 DIAGNOSIS — E782 Mixed hyperlipidemia: Secondary | ICD-10-CM | POA: Diagnosis not present

## 2020-09-02 NOTE — Progress Notes (Signed)
Virtual Visit via telephone Note  I connected with Manuel Kline on 09/02/20 at 1235 by telephone and verified that I am speaking with the correct person using two identifiers. Manuel Kline is currently located at home and wife are currently with her during visit. The provider, Fransisca Kaufmann Salaya Holtrop, MD is located in their office at time of visit.  Call ended at 1246  I discussed the limitations, risks, security and privacy concerns of performing an evaluation and management service by telephone and the availability of in person appointments. I also discussed with the patient that there may be a patient responsible charge related to this service. The patient expressed understanding and agreed to proceed.   History and Present Illness: Type 2 diabetes mellitus Patient comes in today for recheck of his diabetes. Patient has been currently taking trulicity and basaglar and humilin. Patient is currently on an ACE inhibitor/ARB. Patient has seen an ophthalmologist this year. Patient has decreased blood flow and has pain in one of his big toes that has been seen by podiatry. The symptom started onset as an adult hld and htn ARE RELATED TO DM   Hyperlipidemia Patient is coming in for recheck of his hyperlipidemia. The patient is currently taking atorvastin and fish oils. They deny any issues with myalgias or history of liver damage from it. They deny any focal numbness or weakness or chest pain.   Hypertension Patient is currently on quinapril and metoprolol and clonidine and furosemide, and their blood pressure today is 116/60 and 130/64, , hr 66. Patient denies any lightheadedness or dizziness. Patient denies headaches, blurred vision, chest pains, shortness of breath, or weakness. Denies any side effects from medication and is content with current medication.   No diagnosis found.  Outpatient Encounter Medications as of 09/02/2020  Medication Sig  . allopurinol (ZYLOPRIM) 300 MG tablet Take 1/2  (one-half) tablet by mouth once daily  . atorvastatin (LIPITOR) 80 MG tablet Take 1/2 (one-half) tablet by mouth once daily  . cloNIDine (CATAPRES) 0.1 MG tablet Take 1 tablet (0.1 mg total) by mouth daily.  . clopidogrel (PLAVIX) 75 MG tablet Take 1 tablet by mouth once daily  . Cyanocobalamin (VITAMIN B 12 PO) Take 1,000 mcg by mouth daily.  . Dulaglutide (TRULICITY) 1.5 OZ/3.6UY SOPN Inject 1.5 mg into the skin once a week.  . DULoxetine (CYMBALTA) 30 MG capsule Take 1 capsule by mouth once daily  . furosemide (LASIX) 40 MG tablet Take 1 tablet by mouth twice daily  . gabapentin (NEURONTIN) 300 MG capsule Take 1 capsule by mouth twice daily  . glucose blood (ACCU-CHEK GUIDE) test strip 1 each by Other route 5 (five) times daily. Use as instructed  . Insulin Glargine (BASAGLAR KWIKPEN) 100 UNIT/ML SOPN Inject 0.25 mLs (25 Units total) into the skin daily. (Patient taking differently: Inject 32 Units into the skin daily. )  . Insulin Pen Needle 29G X 12.7MM MISC Use ti inject insulin 5 times a day  . insulin regular (HUMULIN R) 100 units/mL injection Inject 0.3 mLs (30 Units total) into the skin 2 (two) times daily before a meal. (Patient taking differently: Inject 25 Units into the skin 2 (two) times daily before a meal. )  . Insulin Syringe-Needle U-100 (RELION INSULIN SYRINGE) 31G X 15/64" 1 ML MISC Use to inject 5 times daily Dx E11.42  . Lancet Device MISC 1 each by Does not apply route 4 (four) times daily.  . metoprolol succinate (TOPROL-XL) 50 MG 24 hr  tablet Take 1 tablet by mouth once daily  . nitroGLYCERIN (NITROSTAT) 0.4 MG SL tablet Place 0.4 mg under the tongue every 5 (five) minutes as needed for chest pain.  . Omega-3 Fatty Acids (FISH OIL) 1000 MG CPDR Take 1 tablet by mouth daily.  . polyethylene glycol powder (GLYCOLAX/MIRALAX) powder Take 17 g by mouth 2 (two) times daily as needed.  . quinapril (ACCUPRIL) 40 MG tablet TAKE 1 TABLET BY MOUTH AT BEDTIME   No  facility-administered encounter medications on file as of 09/02/2020.    Review of Systems  Constitutional: Negative for chills and fever.  Eyes: Negative for visual disturbance.  Respiratory: Negative for shortness of breath and wheezing.   Cardiovascular: Negative for chest pain and leg swelling.  Musculoskeletal: Positive for arthralgias. Negative for back pain and gait problem.  Skin: Negative for color change, rash and wound.  Neurological: Negative for dizziness, weakness and light-headedness.  All other systems reviewed and are negative.   Observations/Objective: Patient sounds comfortable and in no acute distress  Assessment and Plan: Problem List Items Addressed This Visit      Cardiovascular and Mediastinum   Essential hypertension     Endocrine   Type 2 diabetes, uncontrolled, with neuropathy ([place]) - Primary   Diabetic peripheral neuropathy (HCC)   Moderate nonproliferative diabetic retinopathy of both eyes without macular edema associated with type 2 diabetes mellitus (HCC)     Genitourinary   CKD (chronic kidney disease) stage 4, GFR 15-29 ml/min (HCC)     Other   Hyperlipidemia (Chronic)      Continue current medication, it sounds like his blood sugars about pressure running well at home, he is having some issues with his toe but he does have vascular and podiatry that are working on it and told him that if he does not find any answers from them did not have [place] look at it in person and see if we can figure it out. Follow up plan: Return in about 3 months (around 12/01/2020), or if symptoms worsen or fail to improve, for dm and htn.     I discussed the assessment and treatment plan with the patient. The patient was provided an opportunity to ask questions and all were answered. The patient agreed with the plan and demonstrated an understanding of the instructions.   The patient was advised to call back or seek an in-person evaluation if the symptoms worsen or if  the condition fails to improve as anticipated.  The above assessment and management plan was discussed with the patient. The patient verbalized understanding of and has agreed to the management plan. Patient is aware to call the clinic if symptoms persist or worsen. Patient is aware when to return to the clinic for a follow-up visit. Patient educated on when it is appropriate to go to the emergency department.    I provided 11 minutes of non-face-to-face time during this encounter.    Worthy Rancher, MD

## 2020-09-10 DIAGNOSIS — B351 Tinea unguium: Secondary | ICD-10-CM | POA: Diagnosis not present

## 2020-09-10 DIAGNOSIS — E1142 Type 2 diabetes mellitus with diabetic polyneuropathy: Secondary | ICD-10-CM | POA: Diagnosis not present

## 2020-09-10 DIAGNOSIS — M79673 Pain in unspecified foot: Secondary | ICD-10-CM | POA: Diagnosis not present

## 2020-09-10 DIAGNOSIS — L84 Corns and callosities: Secondary | ICD-10-CM | POA: Diagnosis not present

## 2020-09-20 DIAGNOSIS — G4733 Obstructive sleep apnea (adult) (pediatric): Secondary | ICD-10-CM | POA: Diagnosis not present

## 2020-10-04 ENCOUNTER — Other Ambulatory Visit: Payer: Self-pay | Admitting: Family Medicine

## 2020-10-04 DIAGNOSIS — E1140 Type 2 diabetes mellitus with diabetic neuropathy, unspecified: Secondary | ICD-10-CM

## 2020-10-04 DIAGNOSIS — I5022 Chronic systolic (congestive) heart failure: Secondary | ICD-10-CM

## 2020-10-04 DIAGNOSIS — E785 Hyperlipidemia, unspecified: Secondary | ICD-10-CM

## 2020-10-04 DIAGNOSIS — G629 Polyneuropathy, unspecified: Secondary | ICD-10-CM

## 2020-10-04 DIAGNOSIS — IMO0002 Reserved for concepts with insufficient information to code with codable children: Secondary | ICD-10-CM

## 2020-10-04 DIAGNOSIS — Z8673 Personal history of transient ischemic attack (TIA), and cerebral infarction without residual deficits: Secondary | ICD-10-CM

## 2020-10-04 DIAGNOSIS — Z8739 Personal history of other diseases of the musculoskeletal system and connective tissue: Secondary | ICD-10-CM

## 2020-10-16 ENCOUNTER — Ambulatory Visit: Payer: Self-pay | Admitting: *Deleted

## 2020-10-23 ENCOUNTER — Ambulatory Visit (INDEPENDENT_AMBULATORY_CARE_PROVIDER_SITE_OTHER): Payer: Medicare Other

## 2020-10-23 DIAGNOSIS — I255 Ischemic cardiomyopathy: Secondary | ICD-10-CM | POA: Diagnosis not present

## 2020-10-24 LAB — CUP PACEART REMOTE DEVICE CHECK
Battery Remaining Longevity: 65 mo
Battery Voltage: 2.98 V
Brady Statistic AP VP Percent: 0.14 %
Brady Statistic AP VS Percent: 71.49 %
Brady Statistic AS VP Percent: 0.02 %
Brady Statistic AS VS Percent: 28.36 %
Brady Statistic RA Percent Paced: 71.6 %
Brady Statistic RV Percent Paced: 0.15 %
Date Time Interrogation Session: 20220223132206
HighPow Impedance: 53 Ohm
HighPow Impedance: 73 Ohm
Implantable Lead Implant Date: 20030519
Implantable Lead Implant Date: 20030519
Implantable Lead Location: 753859
Implantable Lead Location: 753860
Implantable Lead Model: 158
Implantable Lead Model: 4087
Implantable Lead Serial Number: 115102
Implantable Lead Serial Number: 159999
Implantable Pulse Generator Implant Date: 20171114
Lead Channel Impedance Value: 4047 Ohm
Lead Channel Impedance Value: 4047 Ohm
Lead Channel Impedance Value: 4047 Ohm
Lead Channel Impedance Value: 437 Ohm
Lead Channel Impedance Value: 513 Ohm
Lead Channel Impedance Value: 570 Ohm
Lead Channel Pacing Threshold Amplitude: 0.875 V
Lead Channel Pacing Threshold Amplitude: 1.125 V
Lead Channel Pacing Threshold Pulse Width: 0.4 ms
Lead Channel Pacing Threshold Pulse Width: 0.4 ms
Lead Channel Sensing Intrinsic Amplitude: 14.25 mV
Lead Channel Sensing Intrinsic Amplitude: 14.25 mV
Lead Channel Sensing Intrinsic Amplitude: 2.375 mV
Lead Channel Sensing Intrinsic Amplitude: 2.375 mV
Lead Channel Setting Pacing Amplitude: 2.5 V
Lead Channel Setting Pacing Amplitude: 2.5 V
Lead Channel Setting Pacing Pulse Width: 0.4 ms
Lead Channel Setting Sensing Sensitivity: 0.3 mV

## 2020-11-01 NOTE — Progress Notes (Signed)
Remote ICD transmission.   

## 2020-11-05 ENCOUNTER — Other Ambulatory Visit: Payer: Self-pay | Admitting: *Deleted

## 2020-11-05 NOTE — Patient Outreach (Signed)
[facility]) Care Management  11/05/2020  Manuel Kline [birth year] [id number]  RN Health Coach attempted follow up outreach call to patient.  Patient was unavailable. Per wife he is outside. RN will call back.  Plan: RN will call patient again within 30 days.  [facility] Care Management [phone]

## 2020-11-18 ENCOUNTER — Other Ambulatory Visit: Payer: Self-pay | Admitting: *Deleted

## 2020-11-25 NOTE — Patient Outreach (Signed)
[place] [facility]) Care Management  [place]  11/25/2020   Manuel Kline Nov 16, 1931 [id number]  [place] telephone call to patient.  [employer]ip[employer] compliance verified. Per patient and wife his A1C is 8.1. His fasting blood sugar is 129. Patient blood sugars have been running from 71-204. Per patient he hasn't fallen but when he ambulates he  feels like his legs are going to give out and stagger.  Patient stated his blood sugars are getting better. Patient has agrees to follow up outreach calls.   Encounter Medications:  Outpatient Encounter Medications as of 11/18/2020  Medication Sig Note  . allopurinol (ZYLOPRIM) 300 MG tablet Take 1/2 (one-half) tablet by mouth once daily   . atorvastatin (LIPITOR) 80 MG tablet Take 1/2 (one-half) tablet by mouth once daily   . cloNIDine (CATAPRES) 0.1 MG tablet Take 1 tablet by mouth twice daily   . clopidogrel (PLAVIX) 75 MG tablet Take 1 tablet by mouth once daily   . Cyanocobalamin (VITAMIN B 12 PO) Take 1,000 mcg by mouth daily.   . Dulaglutide (TRULICITY) 1.5 CH/8.5ID SOPN Inject 1.5 mg into the skin once a week.   . DULoxetine (CYMBALTA) 30 MG capsule Take 1 capsule by mouth once daily   . furosemide (LASIX) 40 MG tablet Take 1 tablet by mouth twice daily   . gabapentin (NEURONTIN) 300 MG capsule Take 1 capsule by mouth twice daily   . glucose blood (ACCU-CHEK GUIDE) test strip 1 each by Other route 5 (five) times daily. Use as instructed   . Insulin Glargine (BASAGLAR KWIKPEN) 100 UNIT/ML SOPN Inject 0.25 mLs (25 Units total) into the skin daily. (Patient taking differently: Inject 32 Units into the skin daily. ) 06/27/2019: Medication increased to 28 units   . Insulin Pen Needle 29G X 12.7MM MISC Use ti inject insulin 5 times a day   . insulin regular (HUMULIN R) 100 units/mL injection Inject 0.3 mLs (30 Units total) into the skin 2 (two) times daily before a meal. (Patient taking differently: Inject 25 Units into the skin 2  (two) times daily before a meal. )   . Insulin Syringe-Needle U-100 (RELION INSULIN SYRINGE) 31G X 15/64" 1 ML MISC Use to inject 5 times daily Dx E11.42   . Lancet Device MISC 1 each by Does not apply route 4 (four) times daily.   . metoprolol succinate (TOPROL-XL) 50 MG 24 hr tablet Take 1 tablet by mouth once daily   . nitroGLYCERIN (NITROSTAT) 0.4 MG SL tablet Place 0.4 mg under the tongue every 5 (five) minutes as needed for chest pain.   . Omega-3 Fatty Acids (FISH OIL) 1000 MG CPDR Take 1 tablet by mouth daily.   . polyethylene glycol powder (GLYCOLAX/MIRALAX) powder Take 17 g by mouth 2 (two) times daily as needed.   . quinapril (ACCUPRIL) 40 MG tablet TAKE 1 TABLET BY MOUTH AT BEDTIME    No facility-administered encounter medications on file as of 11/18/2020.    Functional Status:  No flowsheet data found.  Fall/Depression Screening: Fall Risk  11/18/2020 07/17/2020 05/22/2020  Falls in the past year? 1 1 1   Number falls in past yr: 0 0 0  Injury with Fall? 0 0 0  Comment - - -  Risk Factor Category  - - -  Risk for fall due to : History of fall(s);Impaired balance/gait;Impaired mobility History of fall(s);Impaired balance/gait;Impaired mobility Impaired balance/gait;Impaired mobility;History of fall(s)  Risk for fall due to: Comment - - -  Follow  up Falls evaluation completed Falls evaluation completed Falls evaluation completed  Comment - - -   PHQ 2/9 Scores 05/22/2020 04/02/2020 02/16/2020 11/15/2019 10/24/2019 07/25/2019 04/20/2019  PHQ - 2 Score 0 0 0 0 0 0 0  PHQ- 9 Score - - - - - - -    Assessment:  Goals Addressed            This Visit's Progress   . (THN)Monitor and Manage My Blood Sugar       Timeframe:  Long-Range Goal Priority:  High Start Date:     82956213                        Expected End Date:  08657846                    Follow Up Date [id number]   - check blood sugar at prescribed times - check blood sugar if I feel it is too high or too low - enter  blood sugar readings and medication or insulin into daily log - take the blood sugar log to all doctor visits - take the blood sugar meter to all doctor visits    Why is this important?   Checking your blood sugar at home helps to keep it from getting very high or very low.  Writing the results in a diary or log helps the doctor know how to care for you.  Your blood sugar log should have the time, date and the results.  Also, write down the amount of insulin or other medicine that you take.  Other information, like what you ate, exercise done and how you were feeling, will also be helpful.     Notes:  [id number] FBS 129    . [facility] Eye Exam       Timeframe:  Long-Range Goal Priority:  Medium Start Date:     2725366                        Expected End Date:     44034742                 Follow Up Date 59563875   - keep appointment with eye doctor - schedule appointment with eye doctor    Why is this important?   Eye check-ups are important when you have diabetes.  Vision loss can be prevented.    Note Last eye exam [id number]    . (THN)Perform Foot Care       Timeframe:  Long-Range Goal Priority:  Medium Start Date:   88416606                          Expected End Date:    30160109                  Follow Up Date 32355732   - check feet daily for cuts, sores or redness - trim toenails straight across - wash and dry feet carefully every day - wear comfortable, cotton socks - wear comfortable, well-fitting shoes    Why is this important?   Good foot care is very important when you have diabetes.  There are many things you can do to keep your feet healthy and catch a problem Notes:     . (THN)Set My Target A1C       Timeframe:  Long-Range Goal Priority:  [employer]  Date:        29518841                     Expected End Date:    66063016                  Follow Up Date 01093235   - set target A1C    Why is this important?   Your target A1C is decided together by  you and your doctor.  It is based on several things like your age and other health issues.    Notes:  A1C 7.0       Plan:   Patient agrees to Care Plan and Follow-up.  RN will follow up within the month of September RN sent update assessment to PCP RN encouraged patient and wife to continue monitoring blood sugar and documenting  [place] [facility] [phone]

## 2020-11-25 NOTE — Patient Instructions (Signed)
Goals Addressed            This Visit's Progress   . (THN)Monitor and Manage My Blood Sugar       Timeframe:  Long-Range Goal Priority:  High Start Date:     51700174                        Expected End Date:  94496759                    Follow Up Date 16384665   - check blood sugar at prescribed times - check blood sugar if I feel it is too high or too low - enter blood sugar readings and medication or insulin into daily log - take the blood sugar log to all doctor visits - take the blood sugar meter to all doctor visits    Why is this important?   Checking your blood sugar at home helps to keep it from getting very high or very low.  Writing the results in a diary or log helps the doctor know how to care for you.  Your blood sugar log should have the time, date and the results.  Also, write down the amount of insulin or other medicine that you take.  Other information, like what you ate, exercise done and how you were feeling, will also be helpful.     Notes:  [id number] FBS 129    . [facility] Eye Exam       Timeframe:  Long-Range Goal Priority:  Medium Start Date:     7939030                        Expected End Date:     09233007                 Follow Up Date 62263335   - keep appointment with eye doctor - schedule appointment with eye doctor    Why is this important?   Eye check-ups are important when you have diabetes.  Vision loss can be prevented.    Note Last eye exam [id number]    . (THN)Perform Foot Care       Timeframe:  Long-Range Goal Priority:  Medium Start Date:   93734287                          Expected End Date:    68115726                  Follow Up Date 20355974   - check feet daily for cuts, sores or redness - trim toenails straight across - wash and dry feet carefully every day - wear comfortable, cotton socks - wear comfortable, well-fitting shoes    Why is this important?   Good foot care is very important when you have diabetes.   There are many things you can do to keep your feet healthy and catch a problem Notes:     . (THN)Set My Target A1C       Timeframe:  Long-Range Goal Priority:  High Start Date:        16384536                     Expected End Date:    46803212                  Follow Up  Date 47125271   - set target A1C    Why is this important?   Your target A1C is decided together by you and your doctor.  It is based on several things like your age and other health issues.    Notes:  A1C 7.0

## 2020-12-02 ENCOUNTER — Encounter: Payer: Self-pay | Admitting: Family Medicine

## 2020-12-02 ENCOUNTER — Other Ambulatory Visit: Payer: Self-pay

## 2020-12-02 ENCOUNTER — Ambulatory Visit (INDEPENDENT_AMBULATORY_CARE_PROVIDER_SITE_OTHER): Payer: Medicare Other | Admitting: Family Medicine

## 2020-12-02 VITALS — BP 126/61 | HR 61 | Ht 64.0 in | Wt 229.0 lb

## 2020-12-02 DIAGNOSIS — E1142 Type 2 diabetes mellitus with diabetic polyneuropathy: Secondary | ICD-10-CM

## 2020-12-02 DIAGNOSIS — E1165 Type 2 diabetes mellitus with hyperglycemia: Secondary | ICD-10-CM

## 2020-12-02 DIAGNOSIS — E1140 Type 2 diabetes mellitus with diabetic neuropathy, unspecified: Secondary | ICD-10-CM

## 2020-12-02 DIAGNOSIS — G629 Polyneuropathy, unspecified: Secondary | ICD-10-CM | POA: Diagnosis not present

## 2020-12-02 DIAGNOSIS — E785 Hyperlipidemia, unspecified: Secondary | ICD-10-CM

## 2020-12-02 DIAGNOSIS — Z8739 Personal history of other diseases of the musculoskeletal system and connective tissue: Secondary | ICD-10-CM | POA: Diagnosis not present

## 2020-12-02 DIAGNOSIS — E113393 Type 2 diabetes mellitus with moderate nonproliferative diabetic retinopathy without macular edema, bilateral: Secondary | ICD-10-CM

## 2020-12-02 DIAGNOSIS — I5022 Chronic systolic (congestive) heart failure: Secondary | ICD-10-CM | POA: Diagnosis not present

## 2020-12-02 DIAGNOSIS — I10 Essential (primary) hypertension: Secondary | ICD-10-CM | POA: Diagnosis not present

## 2020-12-02 DIAGNOSIS — IMO0002 Reserved for concepts with insufficient information to code with codable children: Secondary | ICD-10-CM

## 2020-12-02 DIAGNOSIS — Z8673 Personal history of transient ischemic attack (TIA), and cerebral infarction without residual deficits: Secondary | ICD-10-CM

## 2020-12-02 DIAGNOSIS — N184 Chronic kidney disease, stage 4 (severe): Secondary | ICD-10-CM | POA: Diagnosis not present

## 2020-12-02 MED ORDER — CLOPIDOGREL BISULFATE 75 MG PO TABS
1.0000 | ORAL_TABLET | Freq: Every day | ORAL | 3 refills | Status: DC
Start: 2020-12-02 — End: 2021-12-08

## 2020-12-02 MED ORDER — FUROSEMIDE 40 MG PO TABS: 40.0000 mg | ORAL_TABLET | Freq: Two times a day (BID) | ORAL | 3 refills | Status: DC

## 2020-12-02 MED ORDER — GABAPENTIN 10 % EX CREA: 1.0000 "application " | TOPICAL_CREAM | Freq: Two times a day (BID) | CUTANEOUS | 3 refills | Status: DC

## 2020-12-02 MED ORDER — DULOXETINE HCL 30 MG PO CPEP: 30.0000 mg | ORAL_CAPSULE | Freq: Every day | ORAL | 3 refills | Status: DC

## 2020-12-02 MED ORDER — INSULIN REGULAR HUMAN 100 UNIT/ML IJ SOLN: 30.0000 [IU] | Freq: Two times a day (BID) | INTRAMUSCULAR | 3 refills | Status: DC

## 2020-12-02 MED ORDER — QUINAPRIL HCL 40 MG PO TABS: 40.0000 mg | ORAL_TABLET | Freq: Every day | ORAL | 3 refills | Status: DC

## 2020-12-02 MED ORDER — METOPROLOL SUCCINATE ER 50 MG PO TB24: 50.0000 mg | ORAL_TABLET | Freq: Every day | ORAL | 3 refills | Status: DC

## 2020-12-02 MED ORDER — ATORVASTATIN CALCIUM 80 MG PO TABS: ORAL_TABLET | ORAL | 3 refills | Status: DC

## 2020-12-02 MED ORDER — GABAPENTIN 300 MG PO CAPS: 1.0000 | ORAL_CAPSULE | Freq: Two times a day (BID) | ORAL | 3 refills | Status: DC

## 2020-12-02 MED ORDER — ALLOPURINOL 300 MG PO TABS: ORAL_TABLET | ORAL | 3 refills | Status: DC

## 2020-12-02 NOTE — Progress Notes (Signed)
BP 126/61   Pulse 61   Ht 5' 4"  (1.626 m)   Wt 229 lb (103.9 kg)   SpO2 99%   BMI 39.31 kg/m    Subjective:   Patient ID: Manuel Kline, male    DOB: [birth year], 87 y.o.   MRN: [id number]  HPI: Manuel Kline is a 87 y.o. male presenting on 12/02/2020 for Medical Management of Chronic Issues, Diabetes, and Hypertension   HPI Type 2 diabetes mellitus Patient comes in today for recheck of his diabetes. Patient has been currently taking Basaglar Humalog and Trulicity. Patient is currently on an ACE inhibitor/ARB. Patient has not seen an ophthalmologist this year. Patient denies any new issues with their feet. The symptom started onset as an adult neuropathy and CAD and CHF and CKD stage IV and PVD ARE RELATED TO DM   Hypertension and CHF Patient is currently on clonidine furosemide and metoprolol and quinapril, and their blood pressure today is 126/61, also sees cardiology. Patient denies any lightheadedness or dizziness. Patient denies headaches, blurred vision, chest pains, shortness of breath, or weakness. Denies any side effects from medication and is content with current medication.   Hyperlipidemia Patient is coming in for recheck of his hyperlipidemia. The patient is currently taking atorvastatin. They deny any issues with myalgias or history of liver damage from it. They deny any focal numbness or weakness or chest pain.   Relevant past medical, surgical, family and social history reviewed and updated as indicated. Interim medical history since our last visit reviewed. Allergies and medications reviewed and updated.  Review of Systems  Constitutional: Negative for chills and fever.  Respiratory: Negative for shortness of breath and wheezing.   Cardiovascular: Negative for chest pain and leg swelling.  Musculoskeletal: Negative for back pain and gait problem.  Skin: Negative for rash.  Neurological: Negative for dizziness, weakness and light-headedness.  All other systems reviewed  and are negative.   Per HPI unless specifically indicated above   Allergies as of 12/02/2020   No Known Allergies     Medication List       Accurate as of December 02, 2020 11:29 AM. If you have any questions, ask your nurse or doctor.        allopurinol 300 MG tablet Commonly known as: ZYLOPRIM Take 1/2 tablet by mouth daily What changed: See the new instructions. Changed by: Fransisca Kaufmann Netasha Wehrli, MD   atorvastatin 80 MG tablet Commonly known as: LIPITOR Take 1/2 (one-half) tablet by mouth once daily   Basaglar KwikPen 100 UNIT/ML Inject 0.25 mLs (25 Units total) into the skin daily. What changed: how much to take   cloNIDine 0.1 MG tablet Commonly known as: CATAPRES Take 1 tablet by mouth twice daily   clopidogrel 75 MG tablet Commonly known as: PLAVIX Take 1 tablet (75 mg total) by mouth daily.   DULoxetine 30 MG capsule Commonly known as: CYMBALTA Take 1 capsule (30 mg total) by mouth daily.   Fish Oil 1000 MG Cpdr Take 1 tablet by mouth daily.   furosemide 40 MG tablet Commonly known as: LASIX Take 1 tablet (40 mg total) by mouth 2 (two) times daily.   Gabapentin 10 % Crea Apply 1 application topically in the morning and at bedtime. Started by: Fransisca Kaufmann Dequavion Follette, MD   gabapentin 300 MG capsule Commonly known as: NEURONTIN Take 1 capsule (300 mg total) by mouth 2 (two) times daily.   glucose blood test strip Commonly known as: Accu-Chek Guide 1 each  by Other route 5 (five) times daily. Use as instructed   Insulin Pen Needle 29G X 12.7MM Misc Use ti inject insulin 5 times a day   insulin regular 100 units/mL injection Commonly known as: HumuLIN R Inject 0.3 mLs (30 Units total) into the skin 2 (two) times daily before a meal. What changed: how much to take   Lancet Device Misc 1 each by Does not apply route 4 (four) times daily.   metoprolol succinate 50 MG 24 hr tablet Commonly known as: TOPROL-XL Take 1 tablet (50 mg total) by mouth daily. Take  with or immediately following a meal. What changed: additional instructions Changed by: Fransisca Kaufmann Lupita Rosales, MD   nitroGLYCERIN 0.4 MG SL tablet Commonly known as: NITROSTAT Place 0.4 mg under the tongue every 5 (five) minutes as needed for chest pain.   polyethylene glycol powder 17 GM/SCOOP powder Commonly known as: GLYCOLAX/MIRALAX Take 17 g by mouth 2 (two) times daily as needed.   quinapril 40 MG tablet Commonly known as: ACCUPRIL Take 1 tablet (40 mg total) by mouth at bedtime.   ReliOn Insulin Syringe 31G X 15/64" 1 ML Misc Generic drug: Insulin Syringe-Needle U-100 Use to inject 5 times daily Dx M60.04   Trulicity 1.5 HT/9.7FS Sopn Generic drug: Dulaglutide Inject 1.5 mg into the skin once a week.   VITAMIN B 12 PO Take 1,000 mcg by mouth daily.        Objective:   BP 126/61   Pulse 61   Ht 5' 4"  (1.626 m)   Wt 229 lb (103.9 kg)   SpO2 99%   BMI 39.31 kg/m   Wt Readings from Last 3 Encounters:  12/02/20 229 lb (103.9 kg)  07/23/20 229 lb (103.9 kg)  07/01/20 226 lb (102.5 kg)    Physical Exam Vitals and nursing note reviewed.  Constitutional:      General: He is not in acute distress.    Appearance: He is well-developed. He is not diaphoretic.  Eyes:     General: No scleral icterus.    Conjunctiva/sclera: Conjunctivae normal.  Neck:     Thyroid: No thyromegaly.  Cardiovascular:     Rate and Rhythm: Normal rate and regular rhythm.     Heart sounds: Normal heart sounds. No murmur heard.   Pulmonary:     Effort: Pulmonary effort is normal. No respiratory distress.     Breath sounds: Normal breath sounds. No wheezing.  Musculoskeletal:        General: Normal range of motion.     Cervical back: Neck supple.  Lymphadenopathy:     Cervical: No cervical adenopathy.  Skin:    General: Skin is warm and dry.     Findings: No rash.  Neurological:     Mental Status: He is alert and oriented to person, place, and time.     Coordination: Coordination  normal.  Psychiatric:        Behavior: Behavior normal.       Assessment & Plan:   Problem List Items Addressed This Visit      Cardiovascular and Mediastinum   Chronic systolic heart failure (HCC) (Chronic)   Relevant Medications   atorvastatin (LIPITOR) 80 MG tablet   furosemide (LASIX) 40 MG tablet   metoprolol succinate (TOPROL-XL) 50 MG 24 hr tablet   quinapril (ACCUPRIL) 40 MG tablet   Essential hypertension   Relevant Medications   atorvastatin (LIPITOR) 80 MG tablet   furosemide (LASIX) 40 MG tablet   metoprolol succinate (TOPROL-XL)  50 MG 24 hr tablet   quinapril (ACCUPRIL) 40 MG tablet   Other Relevant Orders   CMP14+EGFR     Endocrine   Type 2 diabetes, uncontrolled, with neuropathy (HCC)   Relevant Medications   atorvastatin (LIPITOR) 80 MG tablet   DULoxetine (CYMBALTA) 30 MG capsule   insulin regular (HUMULIN R) 100 units/mL injection   quinapril (ACCUPRIL) 40 MG tablet   Gabapentin 10 % CREA   Other Relevant Orders   Bayer DCA Hb A1c Waived   Diabetic peripheral neuropathy (HCC)   Relevant Medications   atorvastatin (LIPITOR) 80 MG tablet   DULoxetine (CYMBALTA) 30 MG capsule   gabapentin (NEURONTIN) 300 MG capsule   insulin regular (HUMULIN R) 100 units/mL injection   quinapril (ACCUPRIL) 40 MG tablet   Gabapentin 10 % CREA   Moderate nonproliferative diabetic retinopathy of both eyes without macular edema associated with type 2 diabetes mellitus (HCC) - Primary   Relevant Medications   atorvastatin (LIPITOR) 80 MG tablet   insulin regular (HUMULIN R) 100 units/mL injection   quinapril (ACCUPRIL) 40 MG tablet     Genitourinary   CKD (chronic kidney disease) stage 4, GFR 15-29 ml/min (HCC)   Relevant Orders   CBC with Differential/Platelet     Other   Hyperlipidemia (Chronic)   Relevant Medications   atorvastatin (LIPITOR) 80 MG tablet   furosemide (LASIX) 40 MG tablet   metoprolol succinate (TOPROL-XL) 50 MG 24 hr tablet   quinapril  (ACCUPRIL) 40 MG tablet   Other Relevant Orders   Lipid panel   History of TIA (transient ischemic attack)   Relevant Medications   clopidogrel (PLAVIX) 75 MG tablet    Other Visit Diagnoses    H/O: gout       Relevant Medications   allopurinol (ZYLOPRIM) 300 MG tablet   Neuropathy       Relevant Medications   DULoxetine (CYMBALTA) 30 MG capsule   gabapentin (NEURONTIN) 300 MG capsule      Continue current medication, no changes, will check blood work today and see how it looks.  His blood pressure today looks decent.  Continue to follow with cardiology.  Patient wanted to try gabapentin cream to help with his neuropathy because he cannot take as much gabapentin because of his CKD stage IV. Follow up plan: Return in about 3 months (around 03/03/2021), or if symptoms worsen or fail to improve, for dm and htn and hld.  Counseling provided for all of the vaccine components Orders Placed This Encounter  Procedures  . CBC with Differential/Platelet  . CMP14+EGFR  . Lipid panel  . Bayer [facility] Hb A1c [place], MD [place] [facility] 12/02/2020, 11:29 AM

## 2020-12-03 ENCOUNTER — Telehealth: Payer: Self-pay

## 2020-12-03 NOTE — Telephone Encounter (Signed)
Patient was prescribed Gabapentin cream on 12/02/20.  [facility] said it is not available in a cream commercially and prescription may need to be sent to a specialty or compounding pharmacy.

## 2020-12-04 NOTE — Telephone Encounter (Signed)
[facility] mixed originally. Pt still has some left. He will call them when refill is needed.

## 2020-12-04 NOTE — Telephone Encounter (Signed)
Patient is going to have to delay work on this, he will have to call around to pharmacies and see if anybody carries or makes that cream

## 2020-12-09 ENCOUNTER — Telehealth: Payer: Self-pay

## 2020-12-09 ENCOUNTER — Other Ambulatory Visit: Payer: Self-pay

## 2020-12-09 ENCOUNTER — Ambulatory Visit (INDEPENDENT_AMBULATORY_CARE_PROVIDER_SITE_OTHER): Payer: Medicare Other

## 2020-12-09 VITALS — Ht 64.0 in | Wt 229.0 lb

## 2020-12-09 DIAGNOSIS — Z0000 Encounter for general adult medical examination without abnormal findings: Secondary | ICD-10-CM | POA: Diagnosis not present

## 2020-12-09 MED ORDER — NITROGLYCERIN 0.4 MG SL SUBL: 0.4000 mg | SUBLINGUAL_TABLET | SUBLINGUAL | 0 refills | Status: DC | PRN

## 2020-12-09 NOTE — Telephone Encounter (Signed)
Sent nitro refill for patient

## 2020-12-09 NOTE — Progress Notes (Signed)
Subjective:   Manuel Kline is a 87 y.o. male who presents for Medicare Annual/Subsequent preventive examination.  Virtual Visit via Telephone Note  I connected with  Manuel Kline on 12/09/20 at  9:00 AM EDT by telephone and verified that I am speaking with the correct person using two identifiers.  Location: Patient: Home Provider: [facility] Persons participating in the virtual visit: patient/Nurse Health Advisor   I discussed the limitations, risks, security and privacy concerns of performing an evaluation and management service by telephone and the availability of in person appointments. The patient expressed understanding and agreed to proceed.  Interactive audio and video telecommunications were attempted between this nurse and patient, however failed, due to patient having technical difficulties OR patient did not have access to video capability.  We continued and completed visit with audio only.  Some vital signs may be absent or patient reported.   Tarius Stangelo E Loys Shugars, LPN   Review of Systems     Cardiac Risk Factors include: advanced age (>62men, >11 women);hypertension;diabetes mellitus;dyslipidemia;male gender;obesity (BMI >30kg/m2)     Objective:    Today's Vitals   12/09/20 0852  Weight: 229 lb (103.9 kg)  Height: 5\' 4"  (1.626 m)  PainSc: 3    Body mass index is 39.31 kg/m.  Advanced Directives 12/09/2020 10/24/2019 04/20/2019 03/27/2019 12/29/2018 12/25/2018 05/26/2018  Does Patient Have a Medical Advance Directive? Yes Yes Yes Yes Yes Yes Yes  Type of Advance Directive - [place];Living will [place];Living will Living will;Healthcare Power of [place];Living will - -  Does patient want to make changes to medical advance directive? Yes (MAU/Ambulatory/Procedural Areas - Information given) No - Patient declined No - Patient declined No - Patient declined - - No - Patient declined  Copy of [place] in Chart? - - - No - copy requested No - copy requested - -    Current Medications (verified) Outpatient Encounter Medications as of 12/09/2020  Medication Sig  . allopurinol (ZYLOPRIM) 300 MG tablet Take 1/2 tablet by mouth daily  . atorvastatin (LIPITOR) 80 MG tablet Take 1/2 (one-half) tablet by mouth once daily  . cloNIDine (CATAPRES) 0.1 MG tablet Take 1 tablet by mouth twice daily  . clopidogrel (PLAVIX) 75 MG tablet Take 1 tablet (75 mg total) by mouth daily.  . Cyanocobalamin (VITAMIN B 12 PO) Take 1,000 mcg by mouth daily.  . Dulaglutide (TRULICITY) 1.5 YP/9.5KD SOPN Inject 1.5 mg into the skin once a week.  . DULoxetine (CYMBALTA) 30 MG capsule Take 1 capsule (30 mg total) by mouth daily.  . furosemide (LASIX) 40 MG tablet Take 1 tablet (40 mg total) by mouth 2 (two) times daily.  Marland Kitchen gabapentin (NEURONTIN) 300 MG capsule Take 1 capsule (300 mg total) by mouth 2 (two) times daily.  . Gabapentin 10 % CREA Apply 1 application topically in the morning and at bedtime.  Marland Kitchen glucose blood (ACCU-CHEK GUIDE) test strip 1 each by Other route 5 (five) times daily. Use as instructed  . Insulin Glargine (BASAGLAR KWIKPEN) 100 UNIT/ML SOPN Inject 0.25 mLs (25 Units total) into the skin daily. (Patient taking differently: Inject 32 Units into the skin daily.)  . Insulin Pen Needle 29G X 12.7MM MISC Use ti inject insulin 5 times a day  . insulin regular (HUMULIN R) 100 units/mL injection Inject 0.3 mLs (30 Units total) into the skin 2 (two) times daily before a meal.  . Insulin Syringe-Needle U-100 (RELION  INSULIN SYRINGE) 31G X 15/64" 1 ML MISC Use to inject 5 times daily Dx E11.42  . Lancet Device MISC 1 each by Does not apply route 4 (four) times daily.  . metoprolol succinate (TOPROL-XL) 50 MG 24 hr tablet Take 1 tablet (50 mg total) by mouth daily. Take with or immediately following a meal.  . nitroGLYCERIN (NITROSTAT) 0.4 MG SL tablet Place 0.4 mg under the tongue every 5 (five) minutes  as needed for chest pain.  . Omega-3 Fatty Acids (FISH OIL) 1000 MG CPDR Take 1 tablet by mouth daily.  . polyethylene glycol powder (GLYCOLAX/MIRALAX) powder Take 17 g by mouth 2 (two) times daily as needed.  . quinapril (ACCUPRIL) 40 MG tablet Take 1 tablet (40 mg total) by mouth at bedtime.   No facility-administered encounter medications on file as of 12/09/2020.    Allergies (verified) Patient has no known allergies.   History: Past Medical History:  Diagnosis Date  . Adenomatous colon polyp 2006  . CAD (coronary artery disease)   . Calcium oxalate renal stones   . Cardiomyopathy   . Cataract   . Cataract   . Cerebral embolism with cerebral infarction 12/26/2018  . Chronic systolic heart failure ([place])        . CKD (chronic kidney disease) stage 4, GFR 15-29 ml/min (HCC) 11/08/2017  . Diabetes ([place])   . Diabetic peripheral neuropathy ([place]) 01/31/2015  . Dual implantable cardioverter-defibrillator in situ    01/16/2002 Dr. Lovena Le  RA lead  Guidant [id number] RV lead  Guidant [id number] Generator  Guidant Prism  09/02/2009 Generator change Medtronic [id number]  SN  [id number]     . Erectile dysfunction   . Erosive esophagitis   . Essential hypertension 11/30/2015  . Hemorrhoids   . History of TIA (transient ischemic attack) 01/19/2019  . HLD (hyperlipidemia)   . HTN (hypertension)   . Hyperlipidemia   . Hypertensive heart disease without CHF 07/31/2011  . ICD (implantable cardiac defibrillator) in place   . ICD dual chamber in situ   . Ischemic cardiomyopathy    EF 40% June 2013   . Left-sided weakness 12/25/2018  . Metabolic syndrome   . Moderate nonproliferative diabetic retinopathy of both eyes without macular edema associated with type 2 diabetes mellitus ([place]) 02/20/2020  . Morbid obesity ([place])   . OSA on CPAP 11/05/2014  . Osteoarthritis   . Posterior vitreous detachment of both eyes 02/20/2020  . Presence of automatic (implantable) cardiac defibrillator 11/08/2017  . S/P CABG  (coronary artery bypass graft) 11/02/2000  . Sleep apnea   . Stroke-like symptoms 12/26/2018  . Syncope 08/06/2009   Qualifier: Diagnosis of  By: Selena Batten CMA, [place]    . Type 2 diabetes, uncontrolled, with neuropathy ([place])    Has retinopahty and neuropathy    . Ventricular tachycardia (paroxysmal) ([place]) 07/14/2016   Past Surgical History:  Procedure Laterality Date  . ABDOMINAL EXPLORATION SURGERY    . BACK SURGERY     X'3  . cardiac bypass    . CARDIAC DEFIBRILLATOR PLACEMENT    . CARPAL TUNNEL RELEASE     X2, bilateral  . CATARACT EXTRACTION    . COLONOSCOPY  06/20/2012   Procedure: COLONOSCOPY;  Surgeon: Sable Feil, MD;  Location: [facility];  Service: Endoscopy;  Laterality: N/A;  . DOPPLER ECHOCARDIOGRAPHY  2003  . EP IMPLANTABLE DEVICE N/A 07/14/2016   Procedure: ICD Generator Changeout;  Surgeon: Evans Lance, MD;  Location: [facility];  Service: Cardiovascular;  Laterality: N/A;  . ESOPHAGOGASTRODUODENOSCOPY  06/20/2012   Procedure: ESOPHAGOGASTRODUODENOSCOPY (EGD);  Surgeon: Sable Feil, MD;  Location: [facility];  Service: Endoscopy;  Laterality: N/A;  . EYE SURGERY    . LAPAROTOMY    . RETINOPATHY SURGERY Bilateral   . rotator cuff surgery     left   Family History  Problem Relation Age of Onset  . Diabetes Brother   . Heart disease Father   . Heart disease Mother   . Diabetes Mother   . Diabetes Brother   . Diabetes Brother   . Cancer Brother        baldder  . Heart disease Brother   . Diabetes Sister   . Diabetes Sister   . Kidney disease Sister        dialysis  . Diabetes Sister   . Diabetes Sister   . Diabetes Sister   . Throat cancer Paternal Uncle    Social History   Socioeconomic History  . Marital status: Married    Spouse name: Alice  . Number of children: 4  . Years of education: 7  . Highest education level: 11th grade  Occupational History  . Occupation: Retired from [employer]: RETIRED   Tobacco Use  . Smoking status: Never Smoker  . Smokeless tobacco: Never Used  Vaping Use  . Vaping Use: Never used  Substance and Sexual Activity  . Alcohol use: No    Alcohol/week: 0.0 standard drinks  . Drug use: No  . Sexual activity: Yes  Other Topics Concern  . Not on file  Social History Narrative   Right handed, Married, 4 kids from previous marriage.  Retired.  Caffeine 1 cup daily.   Social Determinants of Health   Financial Resource Strain: Low Risk   . Difficulty of Paying Living Expenses: Not hard at all  Food Insecurity: No Food Insecurity  . Worried About Charity fundraiser in the Last Year: Never true  . Ran Out of Food in the Last Year: Never true  Transportation Needs: No Transportation Needs  . Lack of Transportation (Medical): No  . Lack of Transportation (Non-Medical): No  Physical Activity: Insufficiently Active  . Days of Exercise per Week: 4 days  . Minutes of Exercise per Session: 20 min  Stress: No Stress Concern Present  . Feeling of Stress : Not at all  Social Connections: Socially Integrated  . Frequency of Communication with Friends and Family: More than three times a week  . Frequency of Social Gatherings with Friends and Family: More than three times a week  . Attends Religious Services: More than 4 times per year  . Active Member of Clubs or Organizations: Yes  . Attends Archivist Meetings: More than 4 times per year  . Marital Status: Married    Tobacco Counseling Counseling given: Not Answered   Clinical Intake:  Pre-visit preparation completed: Yes  Pain : 0-10 Pain Score: 3  Pain Type: Neuropathic pain Pain Location: Toe (Comment which one) (all toes) Pain Descriptors / Indicators: Burning,Sore Pain Onset: 1 to 4 weeks ago Pain Frequency: Intermittent     BMI - recorded: 39.31 Nutritional Status: BMI > 30  Obese Nutritional Risks: None Diabetes: Yes CBG done?: No (150 this am at home per patient) Did pt.  bring in CBG monitor from home?: No  How often do you need to have someone help you when you read instructions, pamphlets, or other written materials  from your doctor or pharmacy?: 1 - Never  Nutrition Risk Assessment:  Has the patient had any N/V/D within the last 2 months?  No  Does the patient have any non-healing wounds?  Yes  He says he has sores on his hands from hitting things on accident that don't want to heal Has the patient had any unintentional weight loss or weight gain?  No   Diabetes:  Is the patient diabetic?  Yes  If diabetic, was a CBG obtained today?  No  Did the patient bring in their glucometer from home?  No  How often do you monitor your CBG's? no.   Financial Strains and Diabetes Management:  Are you having any financial strains with the device, your supplies or your medication? No .  Does the patient want to be seen by Chronic Care Management for management of their diabetes?  No  Would the patient like to be referred to a Nutritionist or for Diabetic Management?  No   Diabetic Exams:  Diabetic Eye Exam: Completed 05/22/2020. Overdue for diabetic eye exam. Pt has been advised about the importance in completing this exam. A referral has been placed today. Message sent to referral coordinator for scheduling purposes. Advised pt to expect a call from office referred to regarding appt.  Diabetic Foot Exam: Completed 02/16/2020. Pt has been advised about the importance in completing this exam. Pt is scheduled for diabetic foot exam on 03/06/2021.    Interpreter Needed?: No  Information entered by :: Dakotah Heiman, LPN   Activities of Daily Living In your present state of health, do you have any difficulty performing the following activities: 12/09/2020  Hearing? Y  Comment wears hearing aids  Vision? N  Difficulty concentrating or making decisions? N  Walking or climbing stairs? Y  Dressing or bathing? N  Doing errands, shopping? N  Preparing Food and eating ?  N  Using the Toilet? N  In the past six months, have you accidently leaked urine? N  Do you have problems with loss of bowel control? N  Managing your Medications? N  Comment his son visits every morning and makes sure he and his wife take meds  Managing your Finances? N  Housekeeping or managing your Housekeeping? N  Some recent data might be hidden    Patient Care Team: Dettinger, Fransisca Kaufmann, MD as PCP - General (Family Medicine) Evans Lance, MD as PCP - Electrophysiology (Cardiology) Park Liter, MD as PCP - Cardiology (Cardiology) Regenia Skeeter, MD as Consulting Physician (Internal Medicine) Paulla Dolly Tamala Fothergill, DPM as Consulting Physician (Podiatry) Jacolyn Reedy, MD as Consulting Physician (Cardiology) Zadie Rhine Clent Demark, MD as Consulting Physician (Ophthalmology) Evans Lance, MD as Consulting Physician (Cardiology) Gardiner Barefoot, DPM as Consulting Physician (Podiatry) Pleasant, Eppie Gibson, RN as [place] Management Pruitt, Royce Macadamia, [facility] (Pharmacist)  Indicate any recent Medical Services you may have received from other than [facility] providers in the past year (date may be approximate).     Assessment:   This is a routine wellness examination for Fabiano.  Hearing/Vision screen  Hearing Screening   125Hz  250Hz  500Hz  1000Hz  2000Hz  3000Hz  4000Hz  6000Hz  8000Hz   Right ear:           Left ear:           Comments: Wears hearing aids - [facility] in [place] for annual exams  Vision Screening Comments: Annual visits with Dr Kathlen Mody and [facility] - wears glasses  Dietary issues and  exercise activities discussed: Current Exercise Habits: Home exercise routine, Type of exercise: walking, Time (Minutes): 20, Frequency (Times/Week): 4, Weekly Exercise (Minutes/Week): 80, Intensity: Mild, Exercise limited by: orthopedic condition(s);neurologic condition(s)  Goals    . (THN)Monitor and Manage My Blood Sugar     Timeframe:  Long-Range Goal Priority:   High Start Date:     19622297                        Expected End Date:  98921194                    Follow Up Date [id number]   - check blood sugar at prescribed times - check blood sugar if I feel it is too high or too low - enter blood sugar readings and medication or insulin into daily log - take the blood sugar log to all doctor visits - take the blood sugar meter to all doctor visits    Why is this important?   Checking your blood sugar at home helps to keep it from getting very high or very low.  Writing the results in a diary or log helps the doctor know how to care for you.  Your blood sugar log should have the time, date and the results.  Also, write down the amount of insulin or other medicine that you take.  Other information, like what you ate, exercise done and how you were feeling, will also be helpful.     Notes:  [id number] FBS 129    . [facility] Eye Exam     Timeframe:  Long-Range Goal Priority:  Medium Start Date:     9702637                        Expected End Date:     85885027                 Follow Up Date [id number]   - keep appointment with eye doctor - schedule appointment with eye doctor    Why is this important?   Eye check-ups are important when you have diabetes.  Vision loss can be prevented.    Note Last eye exam [id number]    . (THN)Perform Foot Care     Timeframe:  Long-Range Goal Priority:  Medium Start Date:   09628366                          Expected End Date:    29476546                  Follow Up Date 50354656   - check feet daily for cuts, sores or redness - trim toenails straight across - wash and dry feet carefully every day - wear comfortable, cotton socks - wear comfortable, well-fitting shoes    Why is this important?   Good foot care is very important when you have diabetes.  There are many things you can do to keep your feet healthy and catch a problem Notes:     . (THN)Set My Target A1C     Timeframe:  Long-Range  Goal Priority:  High Start Date:        81275170                     Expected End Date:    01749449  Follow Up Date 99371696   - set target A1C    Why is this important?   Your target A1C is decided together by you and your doctor.  It is based on several things like your age and other health issues.    Notes:  A1C 7.0    . DIET - INCREASE WATER INTAKE     Try to drink 6-8 glasses of water daily.    . Increase physical activity     Will start with referral to physical therapy to help with balance.       Depression Screen PHQ 2/9 Scores 12/09/2020 05/22/2020 04/02/2020 02/16/2020 11/15/2019 10/24/2019 07/25/2019  PHQ - 2 Score 0 0 0 0 0 0 0  PHQ- 9 Score - - - - - - -    Fall Risk Fall Risk  12/09/2020 12/02/2020 11/18/2020 07/17/2020 05/22/2020  Falls in the past year? 1 1 1 1 1   Number falls in past yr: 0 0 0 0 0  Injury with Fall? 0 0 0 0 0  Comment - - - - -  Risk Factor Category  - - - - -  Risk for fall due to : History of fall(s);Impaired balance/gait;Orthopedic patient;Medication side effect Impaired balance/gait History of fall(s);Impaired balance/gait;Impaired mobility History of fall(s);Impaired balance/gait;Impaired mobility Impaired balance/gait;Impaired mobility;History of fall(s)  Risk for fall due to: Comment - - - - -  Follow up Falls prevention discussed;Education provided Falls evaluation completed Falls evaluation completed Falls evaluation completed Falls evaluation completed  Comment - - - - -    FALL RISK PREVENTION PERTAINING TO THE HOME:  Any stairs in or around the home? Yes  If so, are there any without handrails? No  Home free of loose throw rugs in walkways, pet beds, electrical cords, etc? Yes  Adequate lighting in your home to reduce risk of falls? Yes   ASSISTIVE DEVICES UTILIZED TO PREVENT FALLS:  Life alert? No  Use of a cane, walker or w/c? Yes  Grab bars in the bathroom? Yes  Shower chair or bench in shower? Yes  Elevated  toilet seat or a handicapped toilet? Yes   TIMED UP AND GO:  Was the test performed? No . Telephonic visit.  Cognitive Function: Normal cognitive status assessed by direct observation by this Nurse Health Advisor. No abnormalities found.   MMSE - Mini Mental State Exam 03/23/2018 03/18/2017 03/16/2016 03/01/2015  Orientation to time 5 5 5 5   Orientation to Place 5 5 5 5   Registration 3 3 3 3   Attention/ Calculation 0 3 4 3   Attention/Calculation-comments not attempted - - -  Recall 2 3 3 3   Language- name 2 objects 2 2 2 2   Language- repeat 1 1 1 1   Language- follow 3 step command 3 3 3 3   Language- read & follow direction 1 1 1 1   Write a sentence 1 1 1 1   Copy design 1 1 1 1   Total score 24 28 29 28      6CIT Screen 03/27/2019  What Year? 0 points  What month? 0 points  What time? 0 points  Count back from 20 0 points  Months in reverse 2 points  Repeat phrase 4 points  Total Score 6    Immunizations Immunization History  Administered Date(s) Administered  . Fluad Quad(high Dose 65+) 06/26/2019, 05/22/2020  . Influenza, High Dose Seasonal PF 06/14/2017, 07/19/2018  . Influenza,inj,Quad PF,6+ Mos 06/12/2014, 06/24/2015, 06/17/2016  . Moderna Sars-Covid-2 Vaccination 09/12/2019, 10/13/2019, 07/04/2020  .  Pneumococcal Conjugate-13 03/01/2015  . Pneumococcal Polysaccharide-23 03/16/2016  . Tdap 02/16/2020    TDAP status: Up to date  Flu Vaccine status: Up to date  Pneumococcal vaccine status: Up to date  Covid-19 vaccine status: Completed vaccines  Qualifies for Shingles Vaccine? Yes   Zostavax completed No   Shingrix Completed?: Yes  Screening Tests Health Maintenance  Topic Date Due  . FOOT EXAM  02/15/2021  . OPHTHALMOLOGY EXAM  03/05/2021  . INFLUENZA VACCINE  03/31/2021  . HEMOGLOBIN A1C  06/03/2021  . TETANUS/TDAP  02/15/2030  . PNA vac Low Risk Adult  Completed  . HPV VACCINES  Aged Out  . COVID-19 Vaccine  Discontinued    Health  Maintenance  There are no preventive care reminders to display for this patient.  Colorectal cancer screening: No longer required.   Lung Cancer Screening: (Low Dose CT Chest recommended if Age 80-80 years, 30 pack-year currently smoking OR have quit w/in 15years.) does not qualify.   Additional Screening:  Hepatitis C Screening: does not qualify.  Vision Screening: Recommended annual ophthalmology exams for early detection of glaucoma and other disorders of the eye. Is the patient up to date with their annual eye exam?  Yes  Who is the provider or what is the name of the office in which the patient attends annual eye exams? [facility] If pt is not established with a provider, would they like to be referred to a provider to establish care? No .   Dental Screening: Recommended annual dental exams for proper oral hygiene  Community Resource Referral / Chronic Care Management: CRR required this visit?  No   CCM required this visit?  No      Plan:     I have personally reviewed and noted the following in the patient's chart:   . Medical and social history . Use of alcohol, tobacco or illicit drugs  . Current medications and supplements . Functional ability and status . Nutritional status . Physical activity . Advanced directives . List of other physicians . Hospitalizations, surgeries, and ER visits in previous 12 months . Vitals . Screenings to include cognitive, depression, and falls . Referrals and appointments  In addition, I have reviewed and discussed with patient certain preventive protocols, quality metrics, and best practice recommendations. A written personalized care plan for preventive services as well as general preventive health recommendations were provided to patient.     Sandrea Hammond, LPN   2/[id number]/8676   Nurse Notes: None

## 2020-12-09 NOTE — Patient Instructions (Signed)
[name] , Thank you for taking time to come for your Medicare Wellness Visit. I appreciate your ongoing commitment to your health goals. Please review the following plan we discussed and let me know if I can assist you in the future.   Screening recommendations/referrals: Colonoscopy: No longer required Recommended yearly ophthalmology/optometry visit for glaucoma screening and checkup Recommended yearly dental visit for hygiene and checkup  Vaccinations: Influenza vaccine: Done 05/22/2020 - Repeat annually Pneumococcal vaccine: Done 03/01/2015 & 03/16/2016 Tdap vaccine: Done 02/16/2020 - Repeat in 10 years Shingles vaccine: Done at [facility] in [place] - Patient to bring dates   Covid-19: Done 09/12/19, 10/13/19, & 07/04/2020  Advanced directives: Please bring a copy of your health care power of attorney and living will to the office to be added to your chart at your convenience.  Conditions/risks identified: Continue fall prevention measures - use your walker for added stability.  Next appointment: Follow up in one year for your annual wellness visit.   Preventive Care 71 Years and Older, Male  Preventive care refers to lifestyle choices and visits with your health care provider that can promote health and wellness. What does preventive care include?  A yearly physical exam. This is also called an annual well check.  Dental exams once or twice a year.  Routine eye exams. Ask your health care provider how often you should have your eyes checked.  Personal lifestyle choices, including:  Daily care of your teeth and gums.  Regular physical activity.  Eating a healthy diet.  Avoiding tobacco and drug use.  Limiting alcohol use.  Practicing safe sex.  Taking low doses of aspirin every day.  Taking vitamin and mineral supplements as recommended by your health care provider. What happens during an annual well check? The services and screenings done by your health care provider  during your annual well check will depend on your age, overall health, lifestyle risk factors, and family history of disease. Counseling  Your health care provider may ask you questions about your:  Alcohol use.  Tobacco use.  Drug use.  Emotional well-being.  Home and relationship well-being.  Sexual activity.  Eating habits.  History of falls.  Memory and ability to understand (cognition).  Work and work Statistician. Screening  You may have the following tests or measurements:  Height, weight, and BMI.  Blood pressure.  Lipid and cholesterol levels. These may be checked every 5 years, or more frequently if you are over 31 years old.  Skin check.  Lung cancer screening. You may have this screening every year starting at age 69 if you have a 30-pack-year history of smoking and currently smoke or have quit within the past 15 years.  Fecal occult blood test (FOBT) of the stool. You may have this test every year starting at age 90.  Flexible sigmoidoscopy or colonoscopy. You may have a sigmoidoscopy every 5 years or a colonoscopy every 10 years starting at age 64.  Prostate cancer screening. Recommendations will vary depending on your family history and other risks.  Hepatitis C blood test.  Hepatitis B blood test.  Sexually transmitted disease (STD) testing.  Diabetes screening. This is done by checking your blood sugar (glucose) after you have not eaten for a while (fasting). You may have this done every 1-3 years.  Abdominal aortic aneurysm (AAA) screening. You may need this if you are a current or former smoker.  Osteoporosis. You may be screened starting at age 80 if you are at high risk.  Talk with your health care provider about your test results, treatment options, and if necessary, the need for more tests. Vaccines  Your health care provider may recommend certain vaccines, such as:  Influenza vaccine. This is recommended every year.  Tetanus,  diphtheria, and acellular pertussis (Tdap, Td) vaccine. You may need a Td booster every 10 years.  Zoster vaccine. You may need this after age 66.  Pneumococcal 13-valent conjugate (PCV13) vaccine. One dose is recommended after age 35.  Pneumococcal polysaccharide (PPSV23) vaccine. One dose is recommended after age 7. Talk to your health care provider about which screenings and vaccines you need and how often you need them. This information is not intended to replace advice given to you by your health care provider. Make sure you discuss any questions you have with your health care provider. Document Released: 09/13/2015 Document Revised: 05/06/2016 Document Reviewed: 06/18/2015 Elsevier Interactive Patient Education  2017 [place] Prevention in the Home Falls can cause injuries. They can happen to people of all ages. There are many things you can do to make your home safe and to help prevent falls. What can I do on the outside of my home?  Regularly fix the edges of walkways and driveways and fix any cracks.  Remove anything that might make you trip as you walk through a door, such as a raised step or threshold.  Trim any bushes or trees on the path to your home.  Use bright outdoor lighting.  Clear any walking paths of anything that might make someone trip, such as rocks or tools.  Regularly check to see if handrails are loose or broken. Make sure that both sides of any steps have handrails.  Any raised decks and porches should have guardrails on the edges.  Have any leaves, snow, or ice cleared regularly.  Use sand or salt on walking paths during winter.  Clean up any spills in your garage right away. This includes oil or grease spills. What can I do in the bathroom?  Use night lights.  Install grab bars by the toilet and in the tub and shower. Do not use towel bars as grab bars.  Use non-skid mats or decals in the tub or shower.  If you need to sit down in  the shower, use a plastic, non-slip stool.  Keep the floor dry. Clean up any water that spills on the floor as soon as it happens.  Remove soap buildup in the tub or shower regularly.  Attach bath mats securely with double-sided non-slip rug tape.  Do not have throw rugs and other things on the floor that can make you trip. What can I do in the bedroom?  Use night lights.  Make sure that you have a light by your bed that is easy to reach.  Do not use any sheets or blankets that are too big for your bed. They should not hang down onto the floor.  Have a firm chair that has side arms. You can use this for support while you get dressed.  Do not have throw rugs and other things on the floor that can make you trip. What can I do in the kitchen?  Clean up any spills right away.  Avoid walking on wet floors.  Keep items that you use a lot in easy-to-reach places.  If you need to reach something above you, use a strong step stool that has a grab bar.  Keep electrical cords out of the way.  Do not use floor polish or wax that makes floors slippery. If you must use wax, use non-skid floor wax.  Do not have throw rugs and other things on the floor that can make you trip. What can I do with my stairs?  Do not leave any items on the stairs.  Make sure that there are handrails on both sides of the stairs and use them. Fix handrails that are broken or loose. Make sure that handrails are as long as the stairways.  Check any carpeting to make sure that it is firmly attached to the stairs. Fix any carpet that is loose or worn.  Avoid having throw rugs at the top or bottom of the stairs. If you do have throw rugs, attach them to the floor with carpet tape.  Make sure that you have a light switch at the top of the stairs and the bottom of the stairs. If you do not have them, ask someone to add them for you. What else can I do to help prevent falls?  Wear shoes that:  Do not have high  heels.  Have rubber bottoms.  Are comfortable and fit you well.  Are closed at the toe. Do not wear sandals.  If you use a stepladder:  Make sure that it is fully opened. Do not climb a closed stepladder.  Make sure that both sides of the stepladder are locked into place.  Ask someone to hold it for you, if possible.  Clearly mark and make sure that you can see:  Any grab bars or handrails.  First and last steps.  Where the edge of each step is.  Use tools that help you move around (mobility aids) if they are needed. These include:  Canes.  Walkers.  Scooters.  Crutches.  Turn on the lights when you go into a dark area. Replace any light bulbs as soon as they burn out.  Set up your furniture so you have a clear path. Avoid moving your furniture around.  If any of your floors are uneven, fix them.  If there are any pets around you, be aware of where they are.  Review your medicines with your doctor. Some medicines can make you feel dizzy. This can increase your chance of falling. Ask your doctor what other things that you can do to help prevent falls. This information is not intended to replace advice given to you by your health care provider. Make sure you discuss any questions you have with your health care provider. Document Released: 06/13/2009 Document Revised: 01/23/2016 Document Reviewed: 09/21/2014 Elsevier Interactive Patient Education  2017 [employer].

## 2020-12-17 DIAGNOSIS — M79676 Pain in unspecified toe(s): Secondary | ICD-10-CM | POA: Diagnosis not present

## 2020-12-17 DIAGNOSIS — E1142 Type 2 diabetes mellitus with diabetic polyneuropathy: Secondary | ICD-10-CM | POA: Diagnosis not present

## 2020-12-17 DIAGNOSIS — B351 Tinea unguium: Secondary | ICD-10-CM | POA: Diagnosis not present

## 2020-12-17 DIAGNOSIS — L84 Corns and callosities: Secondary | ICD-10-CM | POA: Diagnosis not present

## 2020-12-24 ENCOUNTER — Ambulatory Visit (INDEPENDENT_AMBULATORY_CARE_PROVIDER_SITE_OTHER): Payer: Medicare Other | Admitting: Nurse Practitioner

## 2020-12-24 ENCOUNTER — Encounter: Payer: Self-pay | Admitting: Nurse Practitioner

## 2020-12-24 ENCOUNTER — Other Ambulatory Visit: Payer: Self-pay

## 2020-12-24 VITALS — BP 149/73 | HR 72 | Temp 97.7°F | Resp 20 | Ht 64.0 in | Wt 230.0 lb

## 2020-12-24 DIAGNOSIS — I10 Essential (primary) hypertension: Secondary | ICD-10-CM | POA: Diagnosis not present

## 2020-12-24 DIAGNOSIS — M545 Low back pain, unspecified: Secondary | ICD-10-CM | POA: Diagnosis not present

## 2020-12-24 DIAGNOSIS — E1140 Type 2 diabetes mellitus with diabetic neuropathy, unspecified: Secondary | ICD-10-CM | POA: Diagnosis not present

## 2020-12-24 DIAGNOSIS — E785 Hyperlipidemia, unspecified: Secondary | ICD-10-CM | POA: Diagnosis not present

## 2020-12-24 DIAGNOSIS — N184 Chronic kidney disease, stage 4 (severe): Secondary | ICD-10-CM | POA: Diagnosis not present

## 2020-12-24 DIAGNOSIS — E1165 Type 2 diabetes mellitus with hyperglycemia: Secondary | ICD-10-CM | POA: Diagnosis not present

## 2020-12-24 LAB — URINALYSIS, COMPLETE
Bilirubin, UA: NEGATIVE
Ketones, UA: NEGATIVE
Leukocytes,UA: NEGATIVE
Nitrite, UA: NEGATIVE
Protein,UA: NEGATIVE
RBC, UA: NEGATIVE
Specific Gravity, UA: 1.015 (ref 1.005–1.030)
Urobilinogen, Ur: 0.2 mg/dL (ref 0.2–1.0)
pH, UA: 7 (ref 5.0–7.5)

## 2020-12-24 LAB — MICROSCOPIC EXAMINATION
Bacteria, UA: NONE SEEN
Epithelial Cells (non renal): NONE SEEN /hpf (ref 0–10)
RBC, Urine: NONE SEEN /hpf (ref 0–2)
WBC, UA: NONE SEEN /hpf (ref 0–5)

## 2020-12-24 LAB — BAYER DCA HB A1C WAIVED: HB A1C (BAYER DCA - WAIVED): 8.1 % — ABNORMAL HIGH (ref <7.0)

## 2020-12-24 MED ORDER — METHYLPREDNISOLONE ACETATE 40 MG/ML IJ SUSP
80.0000 mg | Freq: Once | INTRAMUSCULAR | Status: AC
  Administered 2020-12-24: 80 mg via INTRAMUSCULAR

## 2020-12-24 MED ORDER — METHYLPREDNISOLONE ACETATE 80 MG/ML IJ SUSP: 80.0000 mg | Freq: Once | INTRAMUSCULAR | Status: DC

## 2020-12-24 NOTE — Patient Instructions (Signed)
Acute Back Pain, Adult Acute back pain is sudden and usually short-lived. It is often caused by an injury to the muscles and tissues in the back. The injury may result from:  A muscle or ligament getting overstretched or torn (strained). Ligaments are tissues that connect bones to each other. Lifting something improperly can cause a back strain.  Wear and tear (degeneration) of the spinal disks. Spinal disks are circular tissue that provide cushioning between the bones of the spine (vertebrae).  Twisting motions, such as while playing sports or doing yard work.  A hit to the back.  Arthritis. You may have a physical exam, lab tests, and imaging tests to find the cause of your pain. Acute back pain usually goes away with rest and home care. Follow these instructions at home: Managing pain, stiffness, and swelling  Treatment may include medicines for pain and inflammation that are taken by mouth or applied to the skin, prescription pain medicine, or muscle relaxants. Take over-the-counter and prescription medicines only as told by your health care provider.  Your health care provider may recommend applying ice during the first 24-48 hours after your pain starts. To do this: ? Put ice in a plastic bag. ? Place a towel between your skin and the bag. ? Leave the ice on for 20 minutes, 2-3 times a day.  If directed, apply heat to the affected area as often as told by your health care provider. Use the heat source that your health care provider recommends, such as a moist heat pack or a heating pad. ? Place a towel between your skin and the heat source. ? Leave the heat on for 20-30 minutes. ? Remove the heat if your skin turns bright red. This is especially important if you are unable to feel pain, heat, or cold. You have a greater risk of getting burned. Activity  Do not stay in bed. Staying in bed for more than 1-2 days can delay your recovery.  Sit up and stand up straight. Avoid leaning  forward when you sit or hunching over when you stand. ? If you work at a desk, sit close to it so you do not need to lean over. Keep your chin tucked in. Keep your neck drawn back, and keep your elbows bent at a 90-degree angle (right angle). ? Sit high and close to the steering wheel when you drive. Add lower back (lumbar) support to your car seat, if needed.  Take short walks on even surfaces as soon as you are able. Try to increase the length of time you walk each day.  Do not sit, drive, or stand in one place for more than 30 minutes at a time. Sitting or standing for long periods of time can put stress on your back.  Do not drive or use heavy machinery while taking prescription pain medicine.  Use proper lifting techniques. When you bend and lift, use positions that put less stress on your back: ? Bend your knees. ? Keep the load close to your body. ? Avoid twisting.  Exercise regularly as told by your health care provider. Exercising helps your back heal faster and helps prevent back injuries by keeping muscles strong and flexible.  Work with a physical therapist to make a safe exercise program, as recommended by your health care provider. Do any exercises as told by your physical therapist.   Lifestyle  Maintain a healthy weight. Extra weight puts stress on your back and makes it difficult to have   good posture.  Avoid activities or situations that make you feel anxious or stressed. Stress and anxiety increase muscle tension and can make back pain worse. Learn ways to manage anxiety and stress, such as through exercise. General instructions  Sleep on a firm mattress in a comfortable position. Try lying on your side with your knees slightly bent. If you lie on your back, put a pillow under your knees.  Follow your treatment plan as told by your health care provider. This may include: ? Cognitive or behavioral therapy. ? Acupuncture or massage therapy. ? Meditation or yoga. Contact  a health care provider if:  You have pain that is not relieved with rest or medicine.  You have increasing pain going down into your legs or buttocks.  Your pain does not improve after 2 weeks.  You have pain at night.  You lose weight without trying.  You have a fever or chills. Get help right away if:  You develop new bowel or bladder control problems.  You have unusual weakness or numbness in your arms or legs.  You develop nausea or vomiting.  You develop abdominal pain.  You feel faint. Summary  Acute back pain is sudden and usually short-lived.  Use proper lifting techniques. When you bend and lift, use positions that put less stress on your back.  Take over-the-counter and prescription medicines and apply heat or ice as directed by your health care provider. This information is not intended to replace advice given to you by your health care provider. Make sure you discuss any questions you have with your health care provider. Document Revised: 05/10/2020 Document Reviewed: 05/10/2020 Elsevier Patient Education  2021 Elsevier Inc.  

## 2020-12-24 NOTE — Progress Notes (Signed)
   Subjective:    Patient ID: Manuel Kline, male    DOB: 13-Sep-1931, 87 y.o.   MRN: [id number]   Chief Complaint: flank pain  HPI Patient comes in c/o right flank pain. He has a history of kidney stone years ago and feels the same time. The pain started Sunday. Pain can become a 10/10 at times. Nothing helps the pain. Moving can increase pain. He denies blood in urine.   Review of Systems  Constitutional: Negative.   Respiratory: Negative.   Cardiovascular: Negative.   Genitourinary: Positive for flank pain (right). Negative for difficulty urinating, dysuria, frequency and urgency.  Neurological: Negative.   Psychiatric/Behavioral: Negative.   All other systems reviewed and are negative.      Objective:   Physical Exam Vitals and nursing note reviewed.  Constitutional:      Appearance: Normal appearance.  Cardiovascular:     Rate and Rhythm: Normal rate and regular rhythm.     Heart sounds: Normal heart sounds.  Pulmonary:     Breath sounds: Normal breath sounds.  Abdominal:     Tenderness: There is no right CVA tenderness or left CVA tenderness.  Musculoskeletal:     Comments: Pain right lower back on palpation (-) SLR bil Motor strength and sensation distally intact.  Skin:    General: Skin is warm.     Findings: No rash.  Neurological:     General: No focal deficit present.     Mental Status: He is alert and oriented to person, place, and time.  Psychiatric:        Mood and Affect: Mood normal.        Behavior: Behavior normal.    Urine clear   BP (!) 149/73   Pulse 72   Temp 97.7 F (36.5 C) (Temporal)   Resp 20   Ht 5\' 4"  (1.626 m)   Wt 230 lb (104.3 kg)   SpO2 98%   BMI 39.48 kg/m      Assessment & Plan:  Manuel Kline in today with chief complaint of No chief complaint on file.   1. Acute right-sided low back pain without sciatica Moist heat and rest Follow up with PCP if no better - Urinalysis, Complete - methylPREDNISolone acetate  (DEPO-MEDROL) injection 80 mg    The above assessment and management plan was discussed with the patient. The patient verbalized understanding of and has agreed to the management plan. Patient is aware to call the clinic if symptoms persist or worsen. Patient is aware when to return to the clinic for a follow-up visit. Patient educated on when it is appropriate to go to the emergency department.   Mary-Margaret Hassell Done, FNP

## 2020-12-25 LAB — CBC WITH DIFFERENTIAL/PLATELET
Basophils Absolute: 0 10*3/uL (ref 0.0–0.2)
Basos: 0 %
EOS (ABSOLUTE): 0.2 10*3/uL (ref 0.0–0.4)
Eos: 3 %
Hematocrit: 38.7 % (ref 37.5–51.0)
Hemoglobin: 13.4 g/dL (ref 13.0–17.7)
Immature Grans (Abs): 0 10*3/uL (ref 0.0–0.1)
Immature Granulocytes: 0 %
Lymphocytes Absolute: 2.3 10*3/uL (ref 0.7–3.1)
Lymphs: 30 %
MCH: 31 pg (ref 26.6–33.0)
MCHC: 34.6 g/dL (ref 31.5–35.7)
MCV: 90 fL (ref 79–97)
Monocytes Absolute: 0.6 10*3/uL (ref 0.1–0.9)
Monocytes: 8 %
Neutrophils Absolute: 4.5 10*3/uL (ref 1.4–7.0)
Neutrophils: 59 %
Platelets: 178 10*3/uL (ref 150–450)
RBC: 4.32 x10E6/uL (ref 4.14–5.80)
RDW: 13.9 % (ref 11.6–15.4)
WBC: 7.7 10*3/uL (ref 3.4–10.8)

## 2020-12-25 LAB — LIPID PANEL
Chol/HDL Ratio: 4.5 ratio (ref 0.0–5.0)
Cholesterol, Total: 152 mg/dL (ref 100–199)
HDL: 34 mg/dL — ABNORMAL LOW (ref >39)
LDL Chol Calc (NIH): 72 mg/dL (ref 0–99)
Triglycerides: 281 mg/dL — ABNORMAL HIGH (ref 0–149)
VLDL Cholesterol Cal: 46 mg/dL — ABNORMAL HIGH (ref 5–40)

## 2020-12-25 LAB — CMP14+EGFR
ALT: 28 IU/L (ref 0–44)
AST: 24 IU/L (ref 0–40)
Albumin/Globulin Ratio: 1.5 (ref 1.2–2.2)
Albumin: 4.1 g/dL (ref 3.6–4.6)
Alkaline Phosphatase: 120 IU/L (ref 44–121)
BUN/Creatinine Ratio: 19 (ref 10–24)
BUN: 40 mg/dL — ABNORMAL HIGH (ref 8–27)
Bilirubin Total: 0.5 mg/dL (ref 0.0–1.2)
CO2: 27 mmol/L (ref 20–29)
Calcium: 9.4 mg/dL (ref 8.6–10.2)
Chloride: 95 mmol/L — ABNORMAL LOW (ref 96–106)
Creatinine, Ser: 2.1 mg/dL — ABNORMAL HIGH (ref 0.76–1.27)
Globulin, Total: 2.7 g/dL (ref 1.5–4.5)
Glucose: 237 mg/dL — ABNORMAL HIGH (ref 65–99)
Potassium: 5 mmol/L (ref 3.5–5.2)
Sodium: 137 mmol/L (ref 134–144)
Total Protein: 6.8 g/dL (ref 6.0–8.5)
eGFR: 30 mL/min/{1.73_m2} — ABNORMAL LOW (ref >59)

## 2021-01-02 ENCOUNTER — Other Ambulatory Visit: Payer: Self-pay | Admitting: Family Medicine

## 2021-01-02 DIAGNOSIS — IMO0002 Reserved for concepts with insufficient information to code with codable children: Secondary | ICD-10-CM

## 2021-01-02 DIAGNOSIS — Z8739 Personal history of other diseases of the musculoskeletal system and connective tissue: Secondary | ICD-10-CM

## 2021-01-02 DIAGNOSIS — I5022 Chronic systolic (congestive) heart failure: Secondary | ICD-10-CM

## 2021-01-02 DIAGNOSIS — E1140 Type 2 diabetes mellitus with diabetic neuropathy, unspecified: Secondary | ICD-10-CM

## 2021-01-02 DIAGNOSIS — G629 Polyneuropathy, unspecified: Secondary | ICD-10-CM

## 2021-01-02 DIAGNOSIS — Z8673 Personal history of transient ischemic attack (TIA), and cerebral infarction without residual deficits: Secondary | ICD-10-CM

## 2021-01-08 ENCOUNTER — Encounter: Payer: Self-pay | Admitting: Family Medicine

## 2021-01-08 ENCOUNTER — Other Ambulatory Visit: Payer: Self-pay

## 2021-01-08 ENCOUNTER — Ambulatory Visit (INDEPENDENT_AMBULATORY_CARE_PROVIDER_SITE_OTHER): Payer: Medicare Other | Admitting: Family Medicine

## 2021-01-08 VITALS — BP 127/57 | HR 72

## 2021-01-08 DIAGNOSIS — J069 Acute upper respiratory infection, unspecified: Secondary | ICD-10-CM | POA: Diagnosis not present

## 2021-01-08 MED ORDER — BENZONATATE 100 MG PO CAPS: 100.0000 mg | ORAL_CAPSULE | Freq: Two times a day (BID) | ORAL | 0 refills | Status: DC | PRN

## 2021-01-08 MED ORDER — AMOXICILLIN 500 MG PO CAPS: 500.0000 mg | ORAL_CAPSULE | Freq: Two times a day (BID) | ORAL | 0 refills | Status: DC

## 2021-01-08 NOTE — Progress Notes (Signed)
BP (!) 127/57   Pulse 72   SpO2 96%    Subjective:   Patient ID: Manuel Kline, male    DOB: 08-25-1932, 87 y.o.   MRN: [id number]  HPI: Manuel Kline is a 87 y.o. male presenting on 01/08/2021 for URI (Cough, scratchy throat, nose running. Has seasonal allergies. )   HPI Patient is coming complaint of cough and scratchy throat and runny nose and sinus pressure that is been increasing over the past week but really got worse yesterday and he started having some low-grade temperatures and chills and some body aches yesterday.  He has been having a lot more sinus pressure since yesterday.  He says he was doing good until really started feeling bad yesterday.  He denies any shortness of breath but does feel like he has some wheezing at times.  Relevant past medical, surgical, family and social history reviewed and updated as indicated. Interim medical history since our last visit reviewed. Allergies and medications reviewed and updated.  Review of Systems  Constitutional: Negative for chills and fever.  HENT: Positive for congestion, postnasal drip, rhinorrhea, sinus pressure, sneezing and sore throat. Negative for ear discharge, ear pain and voice change.   Eyes: Negative for pain, discharge, redness and visual disturbance.  Respiratory: Positive for cough and wheezing. Negative for shortness of breath.   Cardiovascular: Negative for chest pain and leg swelling.  Musculoskeletal: Negative for gait problem.  Skin: Negative for rash.  All other systems reviewed and are negative.   Per HPI unless specifically indicated above      Objective:   BP (!) 127/57   Pulse 72   SpO2 96%   Wt Readings from Last 3 Encounters:  12/24/20 230 lb (104.3 kg)  12/09/20 229 lb (103.9 kg)  12/02/20 229 lb (103.9 kg)    Physical Exam Vitals and nursing note reviewed.  Constitutional:      General: He is not in acute distress.    Appearance: He is well-developed. He is not diaphoretic.  HENT:      Right Ear: External ear normal. There is impacted cerumen.     Left Ear: Tympanic membrane, ear canal and external ear normal.     Nose: Mucosal edema and rhinorrhea present.     Right Sinus: Maxillary sinus tenderness present. No frontal sinus tenderness.     Left Sinus: Maxillary sinus tenderness present. No frontal sinus tenderness.     Mouth/Throat:     Mouth: Mucous membranes are moist.     Pharynx: Uvula midline. Posterior oropharyngeal erythema present. No oropharyngeal exudate.     Tonsils: No tonsillar abscesses.  Eyes:     General: No scleral icterus.    Conjunctiva/sclera: Conjunctivae normal.  Neck:     Thyroid: No thyromegaly.  Cardiovascular:     Rate and Rhythm: Normal rate and regular rhythm.     Heart sounds: Normal heart sounds. No murmur heard.   Pulmonary:     Effort: Pulmonary effort is normal. No respiratory distress.     Breath sounds: Normal breath sounds. No wheezing or rales.  Musculoskeletal:        General: Normal range of motion.     Cervical back: Neck supple.  Lymphadenopathy:     Cervical: No cervical adenopathy.  Skin:    General: Skin is warm and dry.     Findings: No rash.  Neurological:     Mental Status: He is alert and oriented to person, place, and time.  Coordination: Coordination normal.  Psychiatric:        Behavior: Behavior normal.       Assessment & Plan:   Problem List Items Addressed This Visit   None   Visit Diagnoses    Upper respiratory tract infection, unspecified type    -  Primary   Relevant Medications   benzonatate (TESSALON) 100 MG capsule   amoxicillin (AMOXIL) 500 MG capsule   Other Relevant Orders   Novel Coronavirus, NAA ([facility])      Will treat like respiratory sinus issue.  Sent amoxicillin and Tessalon Perles, will also do COVID testing. Follow up plan: No follow-ups on file.  Counseling provided for all of the vaccine components Orders Placed This Encounter  Procedures  . Novel  Coronavirus, NAA ([facility])    Caryl Pina, MD [facility] 01/08/2021, 4:47 PM

## 2021-01-09 LAB — SARS-COV-2, NAA 2 DAY TAT

## 2021-01-09 LAB — NOVEL CORONAVIRUS, NAA: SARS-CoV-2, NAA: NOT DETECTED

## 2021-01-20 DIAGNOSIS — G4733 Obstructive sleep apnea (adult) (pediatric): Secondary | ICD-10-CM | POA: Diagnosis not present

## 2021-01-21 DIAGNOSIS — H269 Unspecified cataract: Secondary | ICD-10-CM | POA: Insufficient documentation

## 2021-01-22 ENCOUNTER — Ambulatory Visit: Payer: Medicare Other | Admitting: Cardiology

## 2021-01-22 ENCOUNTER — Encounter: Payer: Self-pay | Admitting: Cardiology

## 2021-01-22 ENCOUNTER — Other Ambulatory Visit: Payer: Self-pay

## 2021-01-22 VITALS — BP 110/70 | HR 60 | Ht 68.0 in | Wt 222.0 lb

## 2021-01-22 DIAGNOSIS — I10 Essential (primary) hypertension: Secondary | ICD-10-CM

## 2021-01-22 DIAGNOSIS — Z95810 Presence of automatic (implantable) cardiac defibrillator: Secondary | ICD-10-CM | POA: Diagnosis not present

## 2021-01-22 DIAGNOSIS — E113393 Type 2 diabetes mellitus with moderate nonproliferative diabetic retinopathy without macular edema, bilateral: Secondary | ICD-10-CM | POA: Diagnosis not present

## 2021-01-22 DIAGNOSIS — I255 Ischemic cardiomyopathy: Secondary | ICD-10-CM

## 2021-01-22 DIAGNOSIS — I2510 Atherosclerotic heart disease of native coronary artery without angina pectoris: Secondary | ICD-10-CM

## 2021-01-22 NOTE — Progress Notes (Signed)
Cardiology Office Note:    Date:  01/22/2021   ID:  Manuel Kline, DOB 1932-05-25, MRN [id number]  PCP:  Manuel Kline, Manuel Kaufmann, MD  Cardiologist:  Manuel Campus, MD    Referring MD: Manuel Kline, Manuel Kaufmann, MD   Chief Complaint  Patient presents with  . Results    History of Present Illness:    Manuel Kline is a 87 y.o. male with past medical history significant for coronary artery disease, status post coronary artery bypass graft many years ago, diastolic congestive heart failure, ICD present, obstructive sleep apnea, essential hypertension, longstanding diabetes for more than 25 years poorly controlled. He comes today to my office for follow-up, overall doing well he comes with his daughter he still very active he still walk a lot and trying to do things she notices that he does slow down but overall considering his age and overall state I think is doing quite well.  Denies have any chest pain tightness squeezing pressure mention of the biggest complaint he have is in his lower extremities apparently vascular studies show some narrowing but none of this is critical to justify his symptomatology.  He was given different medication for neuropathy in his lower extremities with limited success.  Past Medical History:  Diagnosis Date  . Adenomatous colon polyp 2006  . CAD (coronary artery disease)   . Calcium oxalate renal stones   . Cardiomyopathy   . Cataract   . Cataract   . Cerebral embolism with cerebral infarction 12/26/2018  . Chronic systolic heart failure ([place])        . CKD (chronic kidney disease) stage 4, GFR 15-29 ml/min (HCC) 11/08/2017  . Claudication in peripheral vascular disease ([place]) 07/23/2020  . Diabetes ([place])   . Diabetic peripheral neuropathy ([place]) 01/31/2015  . Dual implantable cardioverter-defibrillator in situ    01/16/2002 Dr. Lovena Le  RA lead  Guidant [id number] RV lead  Guidant [id number] Generator  Guidant Prism  09/02/2009 Generator change Medtronic [id number]  SN   [id number]     . Erectile dysfunction   . Erosive esophagitis   . Essential hypertension 11/30/2015  . Hemorrhoids   . History of TIA (transient ischemic attack) 01/19/2019  . HLD (hyperlipidemia)   . HTN (hypertension)   . Hyperlipidemia   . Hypertensive heart disease without CHF 07/31/2011  . ICD (implantable cardiac defibrillator) in place   . ICD dual chamber in situ   . Ischemic cardiomyopathy    EF 40% June 2013   . Left-sided weakness 12/25/2018  . Metabolic syndrome   . Moderate nonproliferative diabetic retinopathy of both eyes without macular edema associated with type 2 diabetes mellitus ([place]) 02/20/2020  . Morbid obesity ([place])   . OSA on CPAP 11/05/2014  . Osteoarthritis   . Posterior vitreous detachment of both eyes 02/20/2020  . Presence of automatic (implantable) cardiac defibrillator 11/08/2017  . S/P CABG (coronary artery bypass graft) 11/02/2000  . Sleep apnea   . Stroke-like symptoms 12/26/2018  . Syncope 08/06/2009   Qualifier: Diagnosis of  By: Selena Batten CMA, [place]    . Type 2 diabetes, uncontrolled, with neuropathy ([place])    Has retinopahty and neuropathy    . Ventricular tachycardia (paroxysmal) ([place]) 07/14/2016    Past Surgical History:  Procedure Laterality Date  . ABDOMINAL EXPLORATION SURGERY    . BACK SURGERY     X'3  . cardiac bypass    . CARDIAC DEFIBRILLATOR PLACEMENT    . CARPAL TUNNEL RELEASE  X2, bilateral  . CATARACT EXTRACTION    . COLONOSCOPY  06/20/2012   Procedure: COLONOSCOPY;  Surgeon: Sable Feil, MD;  Location: [facility];  Service: Endoscopy;  Laterality: N/A;  . DOPPLER ECHOCARDIOGRAPHY  2003  . EP IMPLANTABLE DEVICE N/A 07/14/2016   Procedure: ICD Generator Changeout;  Surgeon: Evans Lance, MD;  Location: [facility];  Service: Cardiovascular;  Laterality: N/A;  . ESOPHAGOGASTRODUODENOSCOPY  06/20/2012   Procedure: ESOPHAGOGASTRODUODENOSCOPY (EGD);  Surgeon: Sable Feil, MD;  Location: [facility];  Service:  Endoscopy;  Laterality: N/A;  . EYE SURGERY    . LAPAROTOMY    . RETINOPATHY SURGERY Bilateral   . rotator cuff surgery     left    Current Medications: Current Meds  Medication Sig  . allopurinol (ZYLOPRIM) 300 MG tablet Take 1/2 tablet by mouth daily (Patient taking differently: Take 150 mg by mouth daily. Take 1/2 tablet by mouth daily)  . cloNIDine (CATAPRES) 0.1 MG tablet Take 1 tablet by mouth twice daily (Patient taking differently: Take 0.1 mg by mouth 2 (two) times daily.)  . clopidogrel (PLAVIX) 75 MG tablet Take 1 tablet (75 mg total) by mouth daily.  . Cyanocobalamin (VITAMIN B 12 PO) Take 1,000 mcg by mouth daily.  . Dulaglutide (TRULICITY) 1.5 QQ/5.9DG SOPN Inject 1.5 mg into the skin once a week.  . DULoxetine (CYMBALTA) 30 MG capsule Take 1 capsule (30 mg total) by mouth daily.  . furosemide (LASIX) 40 MG tablet Take 1 tablet (40 mg total) by mouth 2 (two) times daily.  Marland Kitchen gabapentin (NEURONTIN) 300 MG capsule Take 1 capsule (300 mg total) by mouth 2 (two) times daily.  . Gabapentin 10 % CREA Apply 1 application topically in the morning and at bedtime.  . Insulin Glargine (BASAGLAR KWIKPEN) 100 UNIT/ML SOPN Inject 0.25 mLs (25 Units total) into the skin daily. (Patient taking differently: Inject 32 Units into the skin daily.)  . insulin regular (HUMULIN R) 100 units/mL injection Inject 0.3 mLs (30 Units total) into the skin 2 (two) times daily before a meal.  . metoprolol succinate (TOPROL-XL) 50 MG 24 hr tablet Take 1 tablet (50 mg total) by mouth daily. Take with or immediately following a meal.  . nitroGLYCERIN (NITROSTAT) 0.4 MG SL tablet Place 1 tablet (0.4 mg total) under the tongue every 5 (five) minutes as needed for chest pain.  . Omega-3 Fatty Acids (FISH OIL) 1000 MG CPDR Take 1 tablet by mouth daily.  . polyethylene glycol powder (GLYCOLAX/MIRALAX) powder Take 17 g by mouth 2 (two) times daily as needed. (Patient taking differently: Take 17 g by mouth 2 (two) times  daily as needed for mild constipation or moderate constipation.)  . quinapril (ACCUPRIL) 40 MG tablet Take 1 tablet (40 mg total) by mouth at bedtime.  . [DISCONTINUED] glucose blood (ACCU-CHEK GUIDE) test strip 1 each by Other route 5 (five) times daily. Use as instructed     Allergies:   Patient has no known allergies.   Social History   Socioeconomic History  . Marital status: Married    Spouse name: Alice  . Number of children: 4  . Years of education: 2  . Highest education level: 11th grade  Occupational History  . Occupation: Retired from [employer]: RETIRED  Tobacco Use  . Smoking status: Never Smoker  . Smokeless tobacco: Never Used  Vaping Use  . Vaping Use: Never used  Substance and Sexual Activity  . Alcohol use:  No    Alcohol/week: 0.0 standard drinks  . Drug use: No  . Sexual activity: Yes  Other Topics Concern  . Not on file  Social History Narrative   Right handed, Married, 4 kids from previous marriage.  Retired.  Caffeine 1 cup daily.   Social Determinants of Health   Financial Resource Strain: Low Risk   . Difficulty of Paying Living Expenses: Not hard at all  Food Insecurity: No Food Insecurity  . Worried About Charity fundraiser in the Last Year: Never true  . Ran Out of Food in the Last Year: Never true  Transportation Needs: No Transportation Needs  . Lack of Transportation (Medical): No  . Lack of Transportation (Non-Medical): No  Physical Activity: Insufficiently Active  . Days of Exercise per Week: 4 days  . Minutes of Exercise per Session: 20 min  Stress: No Stress Concern Present  . Feeling of Stress : Not at all  Social Connections: Socially Integrated  . Frequency of Communication with Friends and Family: More than three times a week  . Frequency of Social Gatherings with Friends and Family: More than three times a week  . Attends Religious Services: More than 4 times per year  . Active Member of Clubs or  Organizations: Yes  . Attends Archivist Meetings: More than 4 times per year  . Marital Status: Married     Family History: The patient's family history includes Cancer in his brother; Diabetes in his brother, brother, brother, mother, sister, sister, sister, sister, and sister; Heart disease in his brother, father, and mother; Kidney disease in his sister; Throat cancer in his paternal uncle. ROS:   Please see the history of present illness.    All 14 point review of systems negative except as described per history of present illness  EKGs/Labs/Other Studies Reviewed:      Recent Labs: 12/24/2020: ALT 28; BUN 40; Creatinine, Ser 2.10; Hemoglobin 13.4; Platelets 178; Potassium 5.0; Sodium 137  Recent Lipid Panel    Component Value Date/Time   CHOL 152 12/24/2020 1600   TRIG 281 (H) 12/24/2020 1600   HDL 34 (L) 12/24/2020 1600   CHOLHDL 4.5 12/24/2020 1600   CHOLHDL 3.4 12/26/2018 0558   VLDL 36 12/26/2018 0558   LDLCALC 72 12/24/2020 1600    Physical Exam:    VS:  BP 110/70 (BP Location: Right Arm, Patient Position: Sitting)   Pulse 60   Ht 5\' 8"  (1.727 m)   Wt 222 lb (100.7 kg)   SpO2 93%   BMI 33.75 kg/m     Wt Readings from Last 3 Encounters:  01/22/21 222 lb (100.7 kg)  12/24/20 230 lb (104.3 kg)  12/09/20 229 lb (103.9 kg)     GEN:  Well nourished, well developed in no acute distress HEENT: Normal NECK: No JVD; No carotid bruits LYMPHATICS: No lymphadenopathy CARDIAC: RRR, no murmurs, no rubs, no gallops RESPIRATORY:  Clear to auscultation without rales, wheezing or rhonchi  ABDOMEN: Soft, non-tender, non-distended MUSCULOSKELETAL:  No edema; No deformity  SKIN: Warm and dry LOWER EXTREMITIES: no swelling NEUROLOGIC:  Alert and oriented x 3 PSYCHIATRIC:  Normal affect   ASSESSMENT:    1. Ischemic cardiomyopathy   2. Primary hypertension   3. Coronary artery disease involving native coronary artery of native heart without angina pectoris    4. Moderate nonproliferative diabetic retinopathy of both eyes without macular edema associated with type 2 diabetes mellitus ([place])   5. Presence of automatic (implantable)  cardiac defibrillator    PLAN:    In order of problems listed above:  1. Ischemic cardiomyopathy echocardiogram will be repeated to check left ventricle ejection fraction.  Luckily he seems to be compensated. 2. Essential hypertension blood pressure well controlled 110/70 continue present management. 3. Coronary disease status post coronary artery bypass graft years ago stable continue present management. 4. Diabetes still not best control her is hemoglobin A1c is 8.1.  Primary care physician working on trying to improve it. 5. ICD present I did review interrogation normal function normal parameters normal battery status. 6. Dyslipidemia I did review K PN which show LDL of 72 HDL 34 this is from 24 December 2020.  We will continue monitoring   Medication Adjustments/Labs and Tests Ordered: Current medicines are reviewed at length with the patient today.  Concerns regarding medicines are outlined above.  No orders of the defined types were placed in this encounter.  Medication changes: No orders of the defined types were placed in this encounter.   Signed, Park Liter, MD, [facility] 01/22/2021 11:11 AM    [place]

## 2021-01-22 NOTE — Patient Instructions (Signed)
Medication Instructions:  Your physician recommends that you continue on your current medications as directed. Please refer to the Current Medication list given to you today.  *If you need a refill on your cardiac medications before your next appointment, please call your pharmacy*   Lab Work: None. If you have labs (blood work) drawn today and your tests are completely normal, you will receive your results only by: . MyChart Message (if you have MyChart) OR . A paper copy in the mail If you have any lab test that is abnormal or we need to change your treatment, we will call you to review the results.   Testing/Procedures: Your physician has requested that you have an echocardiogram. Echocardiography is a painless test that uses sound waves to create images of your heart. It provides your doctor with information about the size and shape of your heart and how well your heart's chambers and valves are working. This procedure takes approximately one hour. There are no restrictions for this procedure.     Follow-Up: At [facility], you and your health needs are our priority.  As part of our continuing mission to provide you with exceptional heart care, we have created designated Provider Care Teams.  These Care Teams include your primary Cardiologist (physician) and Advanced Practice Providers (APPs -  Physician Assistants and Nurse Practitioners) who all work together to provide you with the care you need, when you need it.  We recommend signing up for the patient portal called "MyChart".  Sign up information is provided on this After Visit Summary.  MyChart is used to connect with patients for Virtual Visits (Telemedicine).  Patients are able to view lab/test results, encounter notes, upcoming appointments, etc.  Non-urgent messages can be sent to your provider as well.   To learn more about what you can do with MyChart, go to [email or web address].    Your next appointment:   6  month(s)  The format for your next appointment:   In Person  Provider:   Robert Krasowski, MD   Other Instructions   Echocardiogram An echocardiogram is a test that uses sound waves (ultrasound) to produce images of the heart. Images from an echocardiogram can provide important information about:  Heart size and shape.  The size and thickness and movement of your heart's walls.  Heart muscle function and strength.  Heart valve function or if you have stenosis. Stenosis is when the heart valves are too narrow.  If blood is flowing backward through the heart valves (regurgitation).  A tumor or infectious growth around the heart valves.  Areas of heart muscle that are not working well because of poor blood flow or injury from a heart attack.  Aneurysm detection. An aneurysm is a weak or damaged part of an artery wall. The wall bulges out from the normal force of blood pumping through the body. Tell a health care provider about:  Any allergies you have.  All medicines you are taking, including vitamins, herbs, eye drops, creams, and over-the-counter medicines.  Any blood disorders you have.  Any surgeries you have had.  Any medical conditions you have.  Whether you are pregnant or may be pregnant. What are the risks? Generally, this is a safe test. However, problems may occur, including an allergic reaction to dye (contrast) that may be used during the test. What happens before the test? No specific preparation is needed. You may eat and drink normally. What happens during the test?  You will take off your   clothes from the waist up and put on a hospital gown.  Electrodes or electrocardiogram (ECG)patches may be placed on your chest. The electrodes or patches are then connected to a device that monitors your heart rate and rhythm.  You will lie down on a table for an ultrasound exam. A gel will be applied to your chest to help sound waves pass through your skin.  A  handheld device, called a transducer, will be pressed against your chest and moved over your heart. The transducer produces sound waves that travel to your heart and bounce back (or "echo" back) to the transducer. These sound waves will be captured in real-time and changed into images of your heart that can be viewed on a video monitor. The images will be recorded on a computer and reviewed by your health care provider.  You may be asked to change positions or hold your breath for a short time. This makes it easier to get different views or better views of your heart.  In some cases, you may receive contrast through an IV in one of your veins. This can improve the quality of the pictures from your heart. The procedure may vary among health care providers and hospitals.   What can I expect after the test? You may return to your normal, everyday life, including diet, activities, and medicines, unless your health care provider tells you not to do that. Follow these instructions at home:  It is up to you to get the results of your test. Ask your health care provider, or the department that is doing the test, when your results will be ready.  Keep all follow-up visits. This is important. Summary  An echocardiogram is a test that uses sound waves (ultrasound) to produce images of the heart.  Images from an echocardiogram can provide important information about the size and shape of your heart, heart muscle function, heart valve function, and other possible heart problems.  You do not need to do anything to prepare before this test. You may eat and drink normally.  After the echocardiogram is completed, you may return to your normal, everyday life, unless your health care provider tells you not to do that. This information is not intended to replace advice given to you by your health care provider. Make sure you discuss any questions you have with your health care provider. Document Revised:  04/09/2020 Document Reviewed: 04/09/2020 Elsevier Patient Education  2021 Elsevier Inc.   

## 2021-01-30 ENCOUNTER — Other Ambulatory Visit: Payer: Self-pay | Admitting: Family Medicine

## 2021-01-30 DIAGNOSIS — E1140 Type 2 diabetes mellitus with diabetic neuropathy, unspecified: Secondary | ICD-10-CM

## 2021-01-30 DIAGNOSIS — IMO0002 Reserved for concepts with insufficient information to code with codable children: Secondary | ICD-10-CM

## 2021-02-03 DIAGNOSIS — N184 Chronic kidney disease, stage 4 (severe): Secondary | ICD-10-CM | POA: Diagnosis not present

## 2021-02-04 DIAGNOSIS — L570 Actinic keratosis: Secondary | ICD-10-CM | POA: Diagnosis not present

## 2021-02-04 DIAGNOSIS — L814 Other melanin hyperpigmentation: Secondary | ICD-10-CM | POA: Diagnosis not present

## 2021-02-04 DIAGNOSIS — D225 Melanocytic nevi of trunk: Secondary | ICD-10-CM | POA: Diagnosis not present

## 2021-02-04 DIAGNOSIS — L821 Other seborrheic keratosis: Secondary | ICD-10-CM | POA: Diagnosis not present

## 2021-02-18 ENCOUNTER — Encounter (INDEPENDENT_AMBULATORY_CARE_PROVIDER_SITE_OTHER): Payer: Medicare Other | Admitting: Ophthalmology

## 2021-02-21 ENCOUNTER — Other Ambulatory Visit: Payer: Medicare Other | Admitting: Cardiology

## 2021-02-25 ENCOUNTER — Ambulatory Visit (HOSPITAL_BASED_OUTPATIENT_CLINIC_OR_DEPARTMENT_OTHER)
Admission: RE | Admit: 2021-02-25 | Discharge: 2021-02-25 | Disposition: A | Discharge status: Home / Self Care | Payer: Medicare Other | Source: Ambulatory Visit | Attending: Cardiology | Admitting: Cardiology

## 2021-02-25 ENCOUNTER — Other Ambulatory Visit: Payer: Self-pay

## 2021-02-25 DIAGNOSIS — Z95810 Presence of automatic (implantable) cardiac defibrillator: Secondary | ICD-10-CM | POA: Diagnosis not present

## 2021-02-25 DIAGNOSIS — E113393 Type 2 diabetes mellitus with moderate nonproliferative diabetic retinopathy without macular edema, bilateral: Secondary | ICD-10-CM | POA: Diagnosis not present

## 2021-02-25 DIAGNOSIS — I10 Essential (primary) hypertension: Secondary | ICD-10-CM

## 2021-02-25 DIAGNOSIS — G4733 Obstructive sleep apnea (adult) (pediatric): Secondary | ICD-10-CM | POA: Diagnosis not present

## 2021-02-25 DIAGNOSIS — I255 Ischemic cardiomyopathy: Secondary | ICD-10-CM | POA: Diagnosis not present

## 2021-02-25 DIAGNOSIS — I2510 Atherosclerotic heart disease of native coronary artery without angina pectoris: Secondary | ICD-10-CM

## 2021-02-25 LAB — ECHOCARDIOGRAM COMPLETE
AR max vel: 4.96 cm2
AV Area VTI: 5.14 cm2
AV Area mean vel: 4.95 cm2
AV Mean grad: 3 mmHg
AV Peak grad: 4.6 mmHg
Ao pk vel: 1.07 m/s
Area-P 1/2: 2.96 cm2
Calc EF: 49.8 %
S' Lateral: 2.71 cm
Single Plane A2C EF: 22 %
Single Plane A4C EF: 67.3 %

## 2021-02-25 NOTE — Progress Notes (Signed)
*  PRELIMINARY RESULTS* Echocardiogram 2D Echocardiogram has been performed.  Luisa Hart RDCS 02/25/2021, 11:47 AM

## 2021-03-06 ENCOUNTER — Ambulatory Visit: Payer: Medicare Other | Admitting: Family Medicine

## 2021-03-06 ENCOUNTER — Encounter (INDEPENDENT_AMBULATORY_CARE_PROVIDER_SITE_OTHER): Payer: Self-pay

## 2021-03-06 DIAGNOSIS — H40013 Open angle with borderline findings, low risk, bilateral: Secondary | ICD-10-CM | POA: Diagnosis not present

## 2021-03-06 DIAGNOSIS — H0102A Squamous blepharitis right eye, upper and lower eyelids: Secondary | ICD-10-CM | POA: Diagnosis not present

## 2021-03-06 DIAGNOSIS — H524 Presbyopia: Secondary | ICD-10-CM | POA: Diagnosis not present

## 2021-03-06 DIAGNOSIS — Z961 Presence of intraocular lens: Secondary | ICD-10-CM | POA: Diagnosis not present

## 2021-03-06 DIAGNOSIS — E113393 Type 2 diabetes mellitus with moderate nonproliferative diabetic retinopathy without macular edema, bilateral: Secondary | ICD-10-CM | POA: Diagnosis not present

## 2021-03-06 LAB — HM DIABETES EYE EXAM

## 2021-03-07 ENCOUNTER — Telehealth: Payer: Self-pay

## 2021-03-07 NOTE — Telephone Encounter (Signed)
-----   Message from Park Liter, MD sent at 03/07/2021  8:30 AM EDT ----- Echocardiogram showed ejection fraction close to 50% moderate left ventricle hypertrophy, overall minimally worse than it was before may be by 5%.  We will talk about options We will see each other

## 2021-03-07 NOTE — Telephone Encounter (Signed)
Spoke with patient regarding results and recommendation.  Patient verbalizes understanding and is agreeable to plan of care. Advised patient to call back with any issues or concerns.  

## 2021-03-11 ENCOUNTER — Encounter (INDEPENDENT_AMBULATORY_CARE_PROVIDER_SITE_OTHER): Payer: Medicare Other | Admitting: Ophthalmology

## 2021-03-25 DIAGNOSIS — M79676 Pain in unspecified toe(s): Secondary | ICD-10-CM | POA: Diagnosis not present

## 2021-03-25 DIAGNOSIS — E1142 Type 2 diabetes mellitus with diabetic polyneuropathy: Secondary | ICD-10-CM | POA: Diagnosis not present

## 2021-03-25 DIAGNOSIS — L84 Corns and callosities: Secondary | ICD-10-CM | POA: Diagnosis not present

## 2021-03-25 DIAGNOSIS — B351 Tinea unguium: Secondary | ICD-10-CM | POA: Diagnosis not present

## 2021-03-31 ENCOUNTER — Encounter: Payer: Self-pay | Admitting: Family Medicine

## 2021-03-31 ENCOUNTER — Ambulatory Visit (INDEPENDENT_AMBULATORY_CARE_PROVIDER_SITE_OTHER): Payer: Medicare Other | Admitting: Family Medicine

## 2021-03-31 ENCOUNTER — Other Ambulatory Visit: Payer: Self-pay

## 2021-03-31 VITALS — BP 138/68 | HR 79 | Ht 68.0 in | Wt 228.0 lb

## 2021-03-31 DIAGNOSIS — E1165 Type 2 diabetes mellitus with hyperglycemia: Secondary | ICD-10-CM

## 2021-03-31 DIAGNOSIS — I10 Essential (primary) hypertension: Secondary | ICD-10-CM

## 2021-03-31 DIAGNOSIS — IMO0002 Reserved for concepts with insufficient information to code with codable children: Secondary | ICD-10-CM

## 2021-03-31 DIAGNOSIS — E1140 Type 2 diabetes mellitus with diabetic neuropathy, unspecified: Secondary | ICD-10-CM

## 2021-03-31 DIAGNOSIS — N184 Chronic kidney disease, stage 4 (severe): Secondary | ICD-10-CM

## 2021-03-31 DIAGNOSIS — E782 Mixed hyperlipidemia: Secondary | ICD-10-CM

## 2021-03-31 DIAGNOSIS — E113393 Type 2 diabetes mellitus with moderate nonproliferative diabetic retinopathy without macular edema, bilateral: Secondary | ICD-10-CM | POA: Diagnosis not present

## 2021-03-31 DIAGNOSIS — L84 Corns and callosities: Secondary | ICD-10-CM | POA: Diagnosis not present

## 2021-03-31 DIAGNOSIS — I5022 Chronic systolic (congestive) heart failure: Secondary | ICD-10-CM | POA: Diagnosis not present

## 2021-03-31 LAB — BAYER DCA HB A1C WAIVED: HB A1C (BAYER DCA - WAIVED): 8.4 % — ABNORMAL HIGH (ref <7.0)

## 2021-03-31 NOTE — Progress Notes (Signed)
BP 138/68   Pulse 79   Ht 5\' 8"  (1.727 m)   Wt 228 lb (103.4 kg)   SpO2 97%   BMI 34.67 kg/m    Subjective:   Patient ID: Manuel Kline, male    DOB: 03/21/1932, 87 y.o.   MRN: [id number]  HPI: Manuel Kline is a 87 y.o. male presenting on 03/31/2021 for Medical Management of Chronic Issues and Diabetes   HPI Type 2 diabetes mellitus Patient comes in today for recheck of his diabetes. Patient has been currently taking Basaglar and Humalog and and Trulicity. Patient is currently on an ACE inhibitor/ARB. Patient has seen an ophthalmologist this year. Patient denies any new issues with their feet. The symptom started onset as an adult CHF and hyperlipidemia and neuropathy and retinopathy and CKD 4 and CAD ARE RELATED TO DM, he does fight calluses and neuropathy  Hyperlipidemia Patient is coming in for recheck of his hyperlipidemia. The patient is currently taking atorvastatin and fish oil. They deny any issues with myalgias or history of liver damage from it. They deny any focal numbness or weakness or chest pain.   Hypertension Patient is currently on clonidine and furosemide and metoprolol and quinapril, and their blood pressure today is 138/68. Patient denies any lightheadedness or dizziness. Patient denies headaches, blurred vision, chest pains, shortness of breath, or weakness. Denies any side effects from medication and is content with current medication.   CKD and CHF recheck Patient has CKD and CHF and follows up with cardiology and nephrology for this.  Relevant past medical, surgical, family and social history reviewed and updated as indicated. Interim medical history since our last visit reviewed. Allergies and medications reviewed and updated.  Review of Systems  Constitutional:  Negative for chills and fever.  Eyes:  Negative for visual disturbance.  Respiratory:  Negative for cough, shortness of breath and wheezing.   Cardiovascular:  Negative for chest pain, palpitations  and leg swelling.  Musculoskeletal:  Negative for back pain and gait problem.  Skin:  Negative for rash.  Neurological:  Negative for dizziness, weakness and light-headedness.  All other systems reviewed and are negative.  Per HPI unless specifically indicated above   Allergies as of 03/31/2021   No Known Allergies      Medication List        Accurate as of March 31, 2021  4:36 PM. If you have any questions, ask your nurse or doctor.          Accu-Chek Guide test strip Generic drug: glucose blood Check Glucose five times daily Dx E11.40   allopurinol 300 MG tablet Commonly known as: ZYLOPRIM Take 1/2 tablet by mouth daily What changed:  how much to take how to take this when to take this   atorvastatin 80 MG tablet Commonly known as: LIPITOR Take 1/2 (one-half) tablet by mouth once daily What changed:  how much to take how to take this when to take this   Basaglar KwikPen 100 UNIT/ML Inject 0.25 mLs (25 Units total) into the skin daily. What changed: how much to take   cloNIDine 0.1 MG tablet Commonly known as: CATAPRES Take 1 tablet by mouth twice daily   clopidogrel 75 MG tablet Commonly known as: PLAVIX Take 1 tablet (75 mg total) by mouth daily.   DULoxetine 30 MG capsule Commonly known as: CYMBALTA Take 1 capsule (30 mg total) by mouth daily.   Fish Oil 1000 MG Cpdr Take 1 tablet by mouth daily.  furosemide 40 MG tablet Commonly known as: LASIX Take 1 tablet (40 mg total) by mouth 2 (two) times daily.   Gabapentin 10 % Crea Apply 1 application topically in the morning and at bedtime.   gabapentin 300 MG capsule Commonly known as: NEURONTIN Take 1 capsule (300 mg total) by mouth 2 (two) times daily.   insulin regular 100 units/mL injection Commonly known as: HumuLIN R Inject 0.3 mLs (30 Units total) into the skin 2 (two) times daily before a meal.   metoprolol succinate 50 MG 24 hr tablet Commonly known as: TOPROL-XL Take 1 tablet (50  mg total) by mouth daily. Take with or immediately following a meal.   nitroGLYCERIN 0.4 MG SL tablet Commonly known as: NITROSTAT Place 1 tablet (0.4 mg total) under the tongue every 5 (five) minutes as needed for chest pain.   polyethylene glycol powder 17 GM/SCOOP powder Commonly known as: GLYCOLAX/MIRALAX Take 17 g by mouth 2 (two) times daily as needed. What changed: reasons to take this   quinapril 40 MG tablet Commonly known as: ACCUPRIL Take 1 tablet (40 mg total) by mouth at bedtime.   Trulicity 1.5 ZO/1.0RU Sopn Generic drug: Dulaglutide Inject 1.5 mg into the skin once a week.   VITAMIN B 12 PO Take 1,000 mcg by mouth daily.               Durable Medical Equipment  (From admission, onward)           Start     Ordered   03/31/21 0000  For home use only DME Other see comment       Comments: Diabetic shoes, 2/year Diagnosis type 2 diabetes with peripheral neuropathy and foot calluses  Question:  Length of Need  Answer:  Lifetime   03/31/21 1631             Objective:   BP 138/68   Pulse 79   Ht 5\' 8"  (1.727 m)   Wt 228 lb (103.4 kg)   SpO2 97%   BMI 34.67 kg/m   Wt Readings from Last 3 Encounters:  03/31/21 228 lb (103.4 kg)  01/22/21 222 lb (100.7 kg)  12/24/20 230 lb (104.3 kg)    Physical Exam Vitals and nursing note reviewed.  Constitutional:      General: He is not in acute distress.    Appearance: He is well-developed. He is not diaphoretic.  Eyes:     General: No scleral icterus.    Conjunctiva/sclera: Conjunctivae normal.  Neck:     Thyroid: No thyromegaly.  Cardiovascular:     Rate and Rhythm: Normal rate and regular rhythm.     Heart sounds: Normal heart sounds. No murmur heard. Pulmonary:     Effort: Pulmonary effort is normal. No respiratory distress.     Breath sounds: Normal breath sounds. No wheezing.  Musculoskeletal:        General: Normal range of motion.     Cervical back: Neck supple.  Lymphadenopathy:      Cervical: No cervical adenopathy.  Skin:    General: Skin is warm and dry.     Findings: No rash.  Neurological:     Mental Status: He is alert and oriented to person, place, and time.     Coordination: Coordination normal.  Psychiatric:        Behavior: Behavior normal.    Results for orders placed or performed in visit on 03/10/21  HM DIABETES EYE EXAM  Result Value Ref Range  HM Diabetic Eye Exam Retinopathy (A) No Retinopathy   Diabetic Foot Exam - Simple   Simple Foot Form Diabetic Foot exam was performed with the following findings: Yes 03/31/2021  4:21 PM  Visual Inspection See comments: Yes Sensation Testing See comments: Yes Pulse Check Posterior Tibialis and Dorsalis pulse intact bilaterally: Yes Comments Patient has calluses on both lateral plantar surfaces of his feet on the forefoot and is missing his left great toenail.  Needs diabetic shoes     Assessment & Plan:   Problem List Items Addressed This Visit       Cardiovascular and Mediastinum   Chronic systolic heart failure (HCC) (Chronic)   Essential hypertension     Endocrine   Type 2 diabetes, uncontrolled, with neuropathy ([place]) - Primary   Relevant Orders   Bayer DCA Hb A1c Waived   For home use only DME Other see comment   Moderate nonproliferative diabetic retinopathy of both eyes without macular edema associated with type 2 diabetes mellitus (HCC)     Genitourinary   CKD (chronic kidney disease) stage 4, GFR 15-29 ml/min (HCC)     Other   Hyperlipidemia (Chronic)   Other Visit Diagnoses     Foot callus       Relevant Orders   For home use only DME Other see comment     Patient does have 1 small spot on the top of his left foot on the top of his left second toe which does appear fungal in origin, no skin breakdown.  Recommended antifungal and call if worsens  Printed prescription for diabetic shoes, will check A1c today, almost no sensation in either foot Follow up plan: Return in  about 3 months (around 07/01/2021), or if symptoms worsen or fail to improve, for Type 2 diabetes recheck.  Counseling provided for all of the vaccine components Orders Placed This Encounter  Procedures   For home use only DME Other see comment   Bayer [place] Hb A1c [place], MD [facility] 03/31/2021, 4:36 PM

## 2021-04-02 ENCOUNTER — Other Ambulatory Visit: Payer: Self-pay

## 2021-04-02 ENCOUNTER — Ambulatory Visit (INDEPENDENT_AMBULATORY_CARE_PROVIDER_SITE_OTHER): Payer: Medicare Other | Admitting: Ophthalmology

## 2021-04-02 ENCOUNTER — Encounter (INDEPENDENT_AMBULATORY_CARE_PROVIDER_SITE_OTHER): Payer: Self-pay | Admitting: Ophthalmology

## 2021-04-02 DIAGNOSIS — H43813 Vitreous degeneration, bilateral: Secondary | ICD-10-CM

## 2021-04-02 DIAGNOSIS — E113393 Type 2 diabetes mellitus with moderate nonproliferative diabetic retinopathy without macular edema, bilateral: Secondary | ICD-10-CM | POA: Diagnosis not present

## 2021-04-02 DIAGNOSIS — H40009 Preglaucoma, unspecified, unspecified eye: Secondary | ICD-10-CM | POA: Insufficient documentation

## 2021-04-02 NOTE — Assessment & Plan Note (Signed)
The nature of moderate nonproliferative diabetic retinopathy was discussed with the patient as well as the need for more frequent follow up to judge for progression. Good blood glucose, blood pressure, and serum lipid control was recommended as well as avoidance of smoking and maintenance of normal weight.  Close follow up with PCP encouraged. °

## 2021-04-02 NOTE — Progress Notes (Signed)
04/02/2021     CHIEF COMPLAINT Patient presents for Retina Follow Up (7 month fu OU and FP/Pt states VA OU stable since last visit. Pt denies FOL, floaters, or ocular pain OU. Karie [place]: 8.4/LBS: 160/)   HISTORY OF PRESENT ILLNESS: Manuel Kline is a 87 y.o. male who presents to the clinic today for:   HPI     Retina Follow Up           Diagnosis: Diabetic Retinopathy   Laterality: both eyes   Onset: 7 months ago   Severity: mild   Duration: 7 months   Course: stable   Comments: 7 month fu OU and FP Pt states VA OU stable since last visit. Pt denies FOL, floaters, or ocular pain OU.  A1C: 8.4 LBS: 160        Last edited by Kendra Opitz, COA on 04/02/2021  9:29 AM.      Referring physician: Hortencia Pilar, MD [place],  [place] [id number]  HISTORICAL INFORMATION:   Selected notes from the MEDICAL RECORD NUMBER    Lab Results  Component Value Date   HGBA1C 8.4 (H) 03/31/2021     CURRENT MEDICATIONS: No current outpatient medications on file. (Ophthalmic Drugs)   No current facility-administered medications for this visit. (Ophthalmic Drugs)   Current Outpatient Medications (Other)  Medication Sig   allopurinol (ZYLOPRIM) 300 MG tablet Take 1/2 tablet by mouth daily (Patient taking differently: Take 150 mg by mouth daily. Take 1/2 tablet by mouth daily)   atorvastatin (LIPITOR) 80 MG tablet Take 1/2 (one-half) tablet by mouth once daily (Patient taking differently: Take 40 mg by mouth daily. Take 1/2 (one-half) tablet by mouth once daily)   cloNIDine (CATAPRES) 0.1 MG tablet Take 1 tablet by mouth twice daily (Patient taking differently: Take 0.1 mg by mouth 2 (two) times daily.)   clopidogrel (PLAVIX) 75 MG tablet Take 1 tablet (75 mg total) by mouth daily.   Cyanocobalamin (VITAMIN B 12 PO) Take 1,000 mcg by mouth daily.   Dulaglutide (TRULICITY) 1.5 UE/4.5WU SOPN Inject 1.5 mg into the skin once a week.   DULoxetine (CYMBALTA) 30 MG  capsule Take 1 capsule (30 mg total) by mouth daily.   furosemide (LASIX) 40 MG tablet Take 1 tablet (40 mg total) by mouth 2 (two) times daily.   gabapentin (NEURONTIN) 300 MG capsule Take 1 capsule (300 mg total) by mouth 2 (two) times daily.   Gabapentin 10 % CREA Apply 1 application topically in the morning and at bedtime.   glucose blood (ACCU-CHEK GUIDE) test strip Check Glucose five times daily Dx E11.40   Insulin Glargine (BASAGLAR KWIKPEN) 100 UNIT/ML SOPN Inject 0.25 mLs (25 Units total) into the skin daily. (Patient taking differently: Inject 32 Units into the skin daily.)   insulin regular (HUMULIN R) 100 units/mL injection Inject 0.3 mLs (30 Units total) into the skin 2 (two) times daily before a meal.   metoprolol succinate (TOPROL-XL) 50 MG 24 hr tablet Take 1 tablet (50 mg total) by mouth daily. Take with or immediately following a meal.   nitroGLYCERIN (NITROSTAT) 0.4 MG SL tablet Place 1 tablet (0.4 mg total) under the tongue every 5 (five) minutes as needed for chest pain.   Omega-3 Fatty Acids (FISH OIL) 1000 MG CPDR Take 1 tablet by mouth daily.   polyethylene glycol powder (GLYCOLAX/MIRALAX) powder Take 17 g by mouth 2 (two) times daily as needed. (Patient taking differently: Take 17 g  by mouth 2 (two) times daily as needed for mild constipation or moderate constipation.)   quinapril (ACCUPRIL) 40 MG tablet Take 1 tablet (40 mg total) by mouth at bedtime.   No current facility-administered medications for this visit. (Other)      REVIEW OF SYSTEMS:    ALLERGIES No Known Allergies  PAST MEDICAL HISTORY Past Medical History:  Diagnosis Date   Adenomatous colon polyp 2006   CAD (coronary artery disease)    Calcium oxalate renal stones    Cardiomyopathy    Cataract    Cataract    Cerebral embolism with cerebral infarction 0/17/7939   Chronic systolic heart failure (HCC)         CKD (chronic kidney disease) stage 4, GFR 15-29 ml/min ([place]) 11/08/2017    Claudication in peripheral vascular disease ([place]) 07/23/2020   Diabetes ([place])    Diabetic peripheral neuropathy ([place]) 01/31/2015   Dual implantable cardioverter-defibrillator in situ    01/16/2002 Dr. Lovena Le  RA lead  Guidant [id number] RV lead  Guidant [id number] Generator  Guidant Prism  09/02/2009 Generator change Medtronic [id number]  SN  [id number]      Erectile dysfunction    Erosive esophagitis    Essential hypertension 11/30/2015   Hemorrhoids    History of TIA (transient ischemic attack) 01/19/2019   HLD (hyperlipidemia)    HTN (hypertension)    Hyperlipidemia    Hypertensive heart disease without CHF 07/31/2011   ICD (implantable cardiac defibrillator) in place    ICD dual chamber in situ    Ischemic cardiomyopathy    EF 40% June 2013    Left-sided weakness 4/56/2563   Metabolic syndrome    Moderate nonproliferative diabetic retinopathy of both eyes without macular edema associated with type 2 diabetes mellitus ([place]) 02/20/2020   Morbid obesity ([place])    OSA on CPAP 11/05/2014   Osteoarthritis    Posterior vitreous detachment of both eyes 02/20/2020   Presence of automatic (implantable) cardiac defibrillator 11/08/2017   S/P CABG (coronary artery bypass graft) 11/02/2000   Sleep apnea    Stroke-like symptoms 12/26/2018   Syncope 08/06/2009   Qualifier: Diagnosis of  By: Selena Batten CMA, [place]     Type 2 diabetes, uncontrolled, with neuropathy ([place])    Has retinopahty and neuropathy     Ventricular tachycardia (paroxysmal) ([place]) 07/14/2016   Past Surgical History:  Procedure Laterality Date   ABDOMINAL EXPLORATION SURGERY     BACK SURGERY     X'3   cardiac bypass     CARDIAC DEFIBRILLATOR PLACEMENT     CARPAL TUNNEL RELEASE     X2, bilateral   CATARACT EXTRACTION     COLONOSCOPY  06/20/2012   Procedure: COLONOSCOPY;  Surgeon: Sable Feil, MD;  Location: [facility];  Service: Endoscopy;  Laterality: N/A;   DOPPLER ECHOCARDIOGRAPHY  2003   EP IMPLANTABLE DEVICE N/A 07/14/2016    Procedure: ICD Generator Changeout;  Surgeon: Evans Lance, MD;  Location: [facility];  Service: Cardiovascular;  Laterality: N/A;   ESOPHAGOGASTRODUODENOSCOPY  06/20/2012   Procedure: ESOPHAGOGASTRODUODENOSCOPY (EGD);  Surgeon: Sable Feil, MD;  Location: [facility];  Service: Endoscopy;  Laterality: N/A;   EYE SURGERY     LAPAROTOMY     RETINOPATHY SURGERY Bilateral    rotator cuff surgery     left    FAMILY HISTORY Family History  Problem Relation Age of Onset   Diabetes Brother    Heart disease Father    Heart disease Mother  Diabetes Mother    Diabetes Brother    Diabetes Brother    Cancer Brother        baldder   Heart disease Brother    Diabetes Sister    Diabetes Sister    Kidney disease Sister        dialysis   Diabetes Sister    Diabetes Sister    Diabetes Sister    Throat cancer Paternal Uncle     SOCIAL HISTORY Social History   Tobacco Use   Smoking status: Never   Smokeless tobacco: Never  Vaping Use   Vaping Use: Never used  Substance Use Topics   Alcohol use: No    Alcohol/week: 0.0 standard drinks   Drug use: No         OPHTHALMIC EXAM:  Base Eye Exam     Visual Acuity (ETDRS)       Right Left   Dist [place] 20/25 +2 20/30 -2   Dist ph [place]  20/20 -2         Tonometry (Tonopen, 9:35 AM)       Right Left   Pressure 12 10         Pupils       Pupils Dark Light Shape React APD   Right PERRL 4 4 Round Minimal None   Left PERRL 4 4 Round Minimal None         Visual Fields (Counting fingers)       Left Right    Full Full         Extraocular Movement       Right Left    Full, Ortho Full, Ortho         Neuro/Psych     Oriented x3: Yes   Mood/Affect: Normal         Dilation     Both eyes: 1.0% Mydriacyl, 2.5% Phenylephrine @ 9:35 AM           Slit Lamp and Fundus Exam     External Exam       Right Left   External Normal Normal         Slit Lamp Exam       Right Left    Lids/Lashes Normal Normal   Conjunctiva/Sclera White and quiet White and quiet   Cornea Clear Clear   Anterior Chamber Deep and quiet Deep and quiet   Iris Round and reactive Round and reactive   Lens Posterior chamber intraocular lens Posterior chamber intraocular lens   Anterior Vitreous Normal Normal         Fundus Exam       Right Left   Posterior Vitreous Posterior vitreous detachment Posterior vitreous detachment   Disc Normal Normal   C/D Ratio 0.65 0.6   Macula no macular thickening, Microaneurysms, no exudates no macular thickening, Microaneurysms, no exudates   Vessels NPDR- Moderate NPDR- Moderate   Periphery Normal Normal            IMAGING AND PROCEDURES  Imaging and Procedures for 04/02/21  Color Fundus Photography Optos - OU - Both Eyes       Right Eye Progression has been stable. Disc findings include normal observations, increased cup to disc ratio. Macula : normal observations. Periphery : normal observations.   Left Eye Progression has been stable. Disc findings include normal observations, increased cup to disc ratio. Macula : normal observations. Periphery : normal observations.   Notes Moderate nonproliferative diabetic retinopathy no progression  ASSESSMENT/PLAN:  Moderate nonproliferative diabetic retinopathy of both eyes without macular edema associated with type 2 diabetes mellitus (HCC) The nature of moderate nonproliferative diabetic retinopathy was discussed with the patient as well as the need for more frequent follow up to judge for progression. Good blood glucose, blood pressure, and serum lipid control was recommended as well as avoidance of smoking and maintenance of normal weight.  Close follow up with PCP encouraged.  Posterior vitreous detachment of both eyes  The nature of posterior vitreous detachment was discussed with the patient as well as its physiology, its age prevalence, and its possible implication  regarding retinal breaks and detachment.  An informational brochure was offered to the patient.  All the patient's questions were answered.  The patient was asked to return if new or different flashes or floaters develops.   Patient was instructed to contact office immediately if any new changes were noticed. I explained to the patient that vitreous inside the eye is similar to jello inside a bowl. As the jello melts it can start to pull away from the bowl, similarly the vitreous throughout our lives can begin to pull away from the retina. That process is called a posterior vitreous detachment. In some cases, the vitreous can tug hard enough on the retina to form a retinal tear. I discussed with the patient the signs and symptoms of a retinal detachment.  Do not rub the eye.       ICD-10-CM   1. Moderate nonproliferative diabetic retinopathy of both eyes without macular edema associated with type 2 diabetes mellitus (HCC)  Z66.0630 Color Fundus Photography Optos - OU - Both Eyes    2. Posterior vitreous detachment of both eyes  H43.813       1.  Moderate nonproliferative diabetic retinopathy of each eye.  No signs of progression despite poor blood sugar control overall  2.  Large cup-to-disc ratio suggest possibility glaucoma suspect patient under the care of Dr. Marshall Cork follow-up as scheduled  3.  Ophthalmic Meds Ordered this visit:  No orders of the defined types were placed in this encounter.      Return in about 9 months (around 12/31/2021) for DILATE OU, COLOR FP, OCT.  There are no Patient Instructions on file for this visit.   Explained the diagnoses, plan, and follow up with the patient and they expressed understanding.  Patient expressed understanding of the importance of proper follow up care.   Clent Demark Lakeeta Dobosz M.D. Diseases & Surgery of the Retina and Vitreous Retina & Diabetic [place] 04/02/21     Abbreviations: M myopia (nearsighted); A astigmatism; H  hyperopia (farsighted); P presbyopia; Mrx spectacle prescription;  CTL contact lenses; OD right eye; OS left eye; OU both eyes  XT exotropia; ET esotropia; PEK punctate epithelial keratitis; PEE punctate epithelial erosions; DES dry eye syndrome; MGD meibomian gland dysfunction; ATs artificial tears; PFAT's preservative free artificial tears; [place] nuclear sclerotic cataract; PSC posterior subcapsular cataract; ERM epi-retinal membrane; PVD posterior vitreous detachment; RD retinal detachment; DM diabetes mellitus; DR diabetic retinopathy; NPDR non-proliferative diabetic retinopathy; PDR proliferative diabetic retinopathy; CSME clinically significant macular edema; DME diabetic macular edema; dbh dot blot hemorrhages; CWS cotton wool spot; POAG primary open angle glaucoma; C/D cup-to-disc ratio; HVF humphrey visual field; GVF goldmann visual field; OCT optical coherence tomography; IOP intraocular pressure; BRVO Branch retinal vein occlusion; CRVO central retinal vein occlusion; CRAO central retinal artery occlusion; BRAO branch retinal artery occlusion; RT retinal tear; SB scleral buckle; PPV pars  plana vitrectomy; VH Vitreous hemorrhage; PRP panretinal laser photocoagulation; IVK intravitreal kenalog; VMT vitreomacular traction; MH Macular hole;  NVD neovascularization of the disc; NVE neovascularization elsewhere; AREDS age related eye disease study; ARMD age related macular degeneration; POAG primary open angle glaucoma; EBMD epithelial/anterior basement membrane dystrophy; ACIOL anterior chamber intraocular lens; IOL intraocular lens; PCIOL posterior chamber intraocular lens; Phaco/IOL phacoemulsification with intraocular lens placement; [place] photorefractive keratectomy; LASIK laser assisted in situ keratomileusis; HTN hypertension; DM diabetes mellitus; COPD chronic obstructive pulmonary disease

## 2021-04-02 NOTE — Assessment & Plan Note (Signed)

## 2021-04-02 NOTE — Assessment & Plan Note (Signed)
Based only upon optic nerve findings, follow-up as scheduled with Dr. Marshall Cork

## 2021-04-04 ENCOUNTER — Other Ambulatory Visit: Payer: Self-pay | Admitting: Family Medicine

## 2021-04-14 ENCOUNTER — Other Ambulatory Visit: Payer: Self-pay

## 2021-04-14 ENCOUNTER — Ambulatory Visit (INDEPENDENT_AMBULATORY_CARE_PROVIDER_SITE_OTHER): Payer: Medicare Other

## 2021-04-14 DIAGNOSIS — E1149 Type 2 diabetes mellitus with other diabetic neurological complication: Secondary | ICD-10-CM

## 2021-04-14 DIAGNOSIS — E1140 Type 2 diabetes mellitus with diabetic neuropathy, unspecified: Secondary | ICD-10-CM

## 2021-04-14 NOTE — Progress Notes (Signed)
Patient in office today for diabetic shoe measurement and custom inserts. Patient was measured at a 9 x-wide men's shoe size. Dr. Warrick Parisian is the provider currently treating patient's diabetes. Patient selected shoe [address] [employer] as his 1st option and shoe [id number] [employer] biomechanical classic Moc as a back-up shoe option. Patient advised the office will call when shoes are available for pick-up. Patient verbalized understanding.

## 2021-04-22 ENCOUNTER — Telehealth: Payer: Self-pay | Admitting: Podiatry

## 2021-04-22 NOTE — Telephone Encounter (Signed)
Pts daughter called back and got my message but pt was seen by a different provider at another office and was asking if we could make an exception and order the shoes. She said she did come in and make the appt and asked the front desk person if if would be ok for them to have seen this other doctor.  I apologized  she was given the wrong information but we cannot do that as pt has to be a established pt with our practice and I need to have our doctors note with the dx codes to order the shoes.

## 2021-04-22 NOTE — Telephone Encounter (Signed)
Left message for pt to call to schedule an appt with one of our providers since last office visit was 10.1.2020.per  medicare pt has to be seen within a yr.

## 2021-04-23 ENCOUNTER — Ambulatory Visit (INDEPENDENT_AMBULATORY_CARE_PROVIDER_SITE_OTHER): Payer: Medicare Other

## 2021-04-23 DIAGNOSIS — I255 Ischemic cardiomyopathy: Secondary | ICD-10-CM

## 2021-04-23 LAB — CUP PACEART REMOTE DEVICE CHECK
Battery Remaining Longevity: 58 mo
Battery Voltage: 2.98 V
Brady Statistic AP VP Percent: 0.16 %
Brady Statistic AP VS Percent: 77.61 %
Brady Statistic AS VP Percent: 0.02 %
Brady Statistic AS VS Percent: 22.21 %
Brady Statistic RA Percent Paced: 77.75 %
Brady Statistic RV Percent Paced: 0.17 %
Date Time Interrogation Session: 20220824102305
HighPow Impedance: 51 Ohm
HighPow Impedance: 70 Ohm
Implantable Lead Implant Date: 20030519
Implantable Lead Implant Date: 20030519
Implantable Lead Location: 753859
Implantable Lead Location: 753860
Implantable Lead Model: 158
Implantable Lead Model: 4087
Implantable Lead Serial Number: 115102
Implantable Lead Serial Number: 159999
Implantable Pulse Generator Implant Date: 20171114
Lead Channel Impedance Value: 4047 Ohm
Lead Channel Impedance Value: 4047 Ohm
Lead Channel Impedance Value: 4047 Ohm
Lead Channel Impedance Value: 437 Ohm
Lead Channel Impedance Value: 551 Ohm
Lead Channel Impedance Value: 570 Ohm
Lead Channel Pacing Threshold Amplitude: 0.875 V
Lead Channel Pacing Threshold Amplitude: 1.25 V
Lead Channel Pacing Threshold Pulse Width: 0.4 ms
Lead Channel Pacing Threshold Pulse Width: 0.4 ms
Lead Channel Sensing Intrinsic Amplitude: 10.125 mV
Lead Channel Sensing Intrinsic Amplitude: 10.125 mV
Lead Channel Sensing Intrinsic Amplitude: 2.25 mV
Lead Channel Sensing Intrinsic Amplitude: 2.25 mV
Lead Channel Setting Pacing Amplitude: 2.5 V
Lead Channel Setting Pacing Amplitude: 2.5 V
Lead Channel Setting Pacing Pulse Width: 0.4 ms
Lead Channel Setting Sensing Sensitivity: 0.3 mV

## 2021-04-28 ENCOUNTER — Other Ambulatory Visit: Payer: Medicare Other

## 2021-04-29 ENCOUNTER — Other Ambulatory Visit: Payer: Self-pay

## 2021-04-29 ENCOUNTER — Ambulatory Visit: Payer: Medicare Other | Admitting: Podiatrist

## 2021-04-29 ENCOUNTER — Encounter: Payer: Self-pay | Admitting: Podiatrist

## 2021-04-29 DIAGNOSIS — M216X9 Other acquired deformities of unspecified foot: Secondary | ICD-10-CM

## 2021-04-29 DIAGNOSIS — L84 Corns and callosities: Secondary | ICD-10-CM

## 2021-04-29 DIAGNOSIS — E0843 Diabetes mellitus due to underlying condition with diabetic autonomic (poly)neuropathy: Secondary | ICD-10-CM | POA: Diagnosis not present

## 2021-04-29 DIAGNOSIS — Q667 Congenital pes cavus, unspecified foot: Secondary | ICD-10-CM | POA: Diagnosis not present

## 2021-04-29 DIAGNOSIS — L603 Nail dystrophy: Secondary | ICD-10-CM

## 2021-04-29 DIAGNOSIS — R2689 Other abnormalities of gait and mobility: Secondary | ICD-10-CM

## 2021-04-29 NOTE — Progress Notes (Signed)
Chief Complaint  Patient presents with   foot exam    Diabetic foot exam      HPI: Patient is 87 y.o. male who presents today for diabetic foot evaluation.  The patient has painful calluses on both feet and diabetic shoes were recommended for him in the past.  He is here for diabetic foot exam for these shoes.  Subjectively he and his wife also report he has trouble balancing due to the neuropathy he experiences.  His last hemoglobin A1c was 8.1.  His primary care doctor is Dr. Warrick Parisian whom he saw 03/31/2021  Patient Active Problem List   Diagnosis Date Noted   Glaucoma suspect with open angle 04/02/2021   Cataract    Pseudophakia of both eyes 08/20/2020   Claudication in peripheral vascular disease ([place]) 07/23/2020   Sleep apnea    Osteoarthritis    Metabolic syndrome    Hemorrhoids    Erosive esophagitis    Erectile dysfunction    Calcium oxalate renal stones    Moderate nonproliferative diabetic retinopathy of both eyes without macular edema associated with type 2 diabetes mellitus ([place]) 02/20/2020   Posterior vitreous detachment of both eyes 02/20/2020   History of TIA (transient ischemic attack) 01/19/2019   Cerebral embolism with cerebral infarction 12/26/2018   CKD (chronic kidney disease) stage 4, GFR 15-29 ml/min (HCC) 11/08/2017   Presence of automatic (implantable) cardiac defibrillator 11/08/2017   Ventricular tachycardia (paroxysmal) ([place]) 07/14/2016   Essential hypertension 11/30/2015   Diabetic peripheral neuropathy ([place]) 01/31/2015   OSA on CPAP 11/05/2014   CAD (coronary artery disease)    Dual implantable cardioverter-defibrillator in situ    Syncope 08/06/2009   Hyperlipidemia    Type 2 diabetes, uncontrolled, with neuropathy ([place])    Morbid obesity ([place])    Ischemic cardiomyopathy    Chronic systolic heart failure ([place])    Adenomatous colon polyp 2006   S/P CABG (coronary artery bypass graft) 11/02/2000    Current Outpatient Medications on File Prior  to Visit  Medication Sig Dispense Refill   allopurinol (ZYLOPRIM) 300 MG tablet Take 1/2 tablet by mouth daily (Patient taking differently: Take 150 mg by mouth daily. Take 1/2 tablet by mouth daily) 45 tablet 3   atorvastatin (LIPITOR) 80 MG tablet Take 1/2 (one-half) tablet by mouth once daily (Patient taking differently: Take 40 mg by mouth daily. Take 1/2 (one-half) tablet by mouth once daily) 45 tablet 3   cloNIDine (CATAPRES) 0.1 MG tablet Take 1 tablet by mouth twice daily 180 tablet 0   clopidogrel (PLAVIX) 75 MG tablet Take 1 tablet (75 mg total) by mouth daily. 90 tablet 3   Cyanocobalamin (VITAMIN B 12 PO) Take 1,000 mcg by mouth daily.     Dulaglutide (TRULICITY) 1.5 HC/6.2BJ SOPN Inject 1.5 mg into the skin once a week. 3 mL 11   DULoxetine (CYMBALTA) 30 MG capsule Take 1 capsule (30 mg total) by mouth daily. 90 capsule 3   furosemide (LASIX) 40 MG tablet Take 1 tablet (40 mg total) by mouth 2 (two) times daily. 180 tablet 3   gabapentin (NEURONTIN) 300 MG capsule Take 1 capsule (300 mg total) by mouth 2 (two) times daily. 180 capsule 3   Gabapentin 10 % CREA Apply 1 application topically in the morning and at bedtime. 30 g 3   glucose blood (ACCU-CHEK GUIDE) test strip Check Glucose five times daily Dx E11.40 400 each 5   Insulin Glargine (BASAGLAR KWIKPEN) 100 UNIT/ML SOPN Inject 0.25 mLs (25  Units total) into the skin daily. (Patient taking differently: Inject 32 Units into the skin daily.)     insulin regular (HUMULIN R) 100 units/mL injection Inject 0.3 mLs (30 Units total) into the skin 2 (two) times daily before a meal. 60 mL 3   metoprolol succinate (TOPROL-XL) 50 MG 24 hr tablet Take 1 tablet (50 mg total) by mouth daily. Take with or immediately following a meal. 90 tablet 3   nitroGLYCERIN (NITROSTAT) 0.4 MG SL tablet Place 1 tablet (0.4 mg total) under the tongue every 5 (five) minutes as needed for chest pain. 25 tablet 0   Omega-3 Fatty Acids (FISH OIL) 1000 MG CPDR Take 1  tablet by mouth daily.     polyethylene glycol powder (GLYCOLAX/MIRALAX) powder Take 17 g by mouth 2 (two) times daily as needed. (Patient taking differently: Take 17 g by mouth 2 (two) times daily as needed for mild constipation or moderate constipation.) 3350 g 1   quinapril (ACCUPRIL) 40 MG tablet Take 1 tablet (40 mg total) by mouth at bedtime. 90 tablet 3   No current facility-administered medications on file prior to visit.    No Known Allergies  Review of Systems No fevers, chills, nausea, muscle aches, no difficulty breathing, no calf pain, no chest pain or shortness of breath.   Physical Exam  GENERAL APPEARANCE: Alert, conversant. Appropriately groomed. No acute distress.   VASCULAR: Pedal pulses palpable DP and PT bilateral.  Capillary refill time is immediate to all digits,  Proximal to distal cooling it warm to warm.  Digital perfusion adequate.   NEUROLOGIC: sensation is diminished with profound peripheral neuropathy both epicritic and protectively to bilateral feet.  Absent sensation to 5.07 monofilament at 0/5 sites bilateral.  Light touch is decreased bilateral, vibratory sensation absent bilateral  MUSCULOSKELETAL: High arch foot type is noted bilateral.  Prominent fifth metatarsal head with preulcerative callus is also noted bilateral.  Prominent first metatarsal head is also noted bilateral.  DERMATOLOGIC: skin is warm, supple, and dry.  No open lesions noted.  No rash, hyperkeratotic lesion submetatarsal 5 bilaterally noted.  Digital nails are thick, discolored, dystrophic and clinically mycotic bilateral 2-5 left, 1-5 right.  Left hallux nail has been permanently removed with pain over the top of the  nail bed.     Assessment   1. Diabetes mellitus due to underlying condition with diabetic autonomic neuropathy, unspecified whether long term insulin use ([place])   2. Dystrophic nail   3. High foot arch   4. Prominent metatarsal head, unspecified laterality   5.  Callus of foot   6. Balance problem      Plan  Discussed exam findings with the patient and his wife.  Because he is diabetic with peripheral neuropathy in addition to the high arches and prominent metatarsal heads and preulcerative calluses did agree that diabetic shoes would be beneficial for him.  Also discussed he may benefit from a balance brace and I will ask EJ to evaluate him and see if this would be beneficial for this patient.  We will call when the diabetic shoes are ready for pickup and try and get that appointment scheduled to evaluate for balance brace at the same time.

## 2021-05-08 NOTE — Progress Notes (Signed)
Remote ICD transmission.   

## 2021-05-13 DIAGNOSIS — K118 Other diseases of salivary glands: Secondary | ICD-10-CM | POA: Diagnosis not present

## 2021-05-15 ENCOUNTER — Encounter: Payer: Self-pay | Admitting: Nurse Practitioner

## 2021-05-15 ENCOUNTER — Ambulatory Visit (INDEPENDENT_AMBULATORY_CARE_PROVIDER_SITE_OTHER): Payer: Medicare Other | Admitting: Nurse Practitioner

## 2021-05-15 ENCOUNTER — Other Ambulatory Visit: Payer: Self-pay

## 2021-05-15 VITALS — BP 116/61 | HR 69 | Temp 99.0°F | Resp 20 | Ht 68.0 in | Wt 228.0 lb

## 2021-05-15 DIAGNOSIS — R059 Cough, unspecified: Secondary | ICD-10-CM

## 2021-05-15 MED ORDER — BENZONATATE 100 MG PO CAPS: 100.0000 mg | ORAL_CAPSULE | Freq: Three times a day (TID) | ORAL | 0 refills | Status: DC | PRN

## 2021-05-15 MED ORDER — AZITHROMYCIN 250 MG PO TABS: ORAL_TABLET | ORAL | 0 refills | Status: DC

## 2021-05-15 NOTE — Patient Instructions (Signed)

## 2021-05-15 NOTE — Progress Notes (Signed)
Subjective:    Patient ID: Manuel Kline, male    DOB: 1932-08-28, 87 y.o.   MRN: [id number]   Chief Complaint: Dizziness ("Swimmy-head" most of the time ) and Cough (X 3-4 days )   HPI Patient comes in c/o feeling dizzy this has been going on for several years. His main concern to day is his cough tat started 2 days ago. He has been wheezing some. He denies any other symptoms. Has been taking OTC cough meds.     Review of Systems  Constitutional:  Negative for chills and fever.  HENT:  Negative for postnasal drip, sinus pain and sore throat.   Respiratory:  Positive for cough and wheezing. Negative for shortness of breath.   Cardiovascular: Negative.   Gastrointestinal: Negative.   Neurological:  Positive for dizziness (chronic).      Objective:   Physical Exam Constitutional:      Appearance: Normal appearance. He is obese.  HENT:     Right Ear: Tympanic membrane normal.     Left Ear: Tympanic membrane normal.     Nose: Nose normal.     Mouth/Throat:     Mouth: Mucous membranes are dry.  Eyes:     Extraocular Movements: Extraocular movements intact.     Pupils: Pupils are equal, round, and reactive to light.  Cardiovascular:     Rate and Rhythm: Normal rate and regular rhythm.     Heart sounds: Normal heart sounds.  Pulmonary:     Effort: Pulmonary effort is normal.     Breath sounds: Wheezing present.     Comments: dry tight cough Skin:    General: Skin is warm.  Neurological:     General: No focal deficit present.     Mental Status: He is alert and oriented to person, place, and time.  Psychiatric:        Mood and Affect: Mood normal.        Behavior: Behavior normal.    BP 116/61   Pulse 69   Temp 99 F (37.2 C)   Resp 20   Ht 5\' 8"  (1.727 m)   Wt 228 lb (103.4 kg)   SpO2 95%   BMI 34.67 kg/m        Assessment & Plan:   Manuel Kline in today with chief complaint of Dizziness ("Swimmy-head" most of the time ) and Cough (X 3-4 days )   1.  Cough  1. Take meds as prescribed 2. Use a cool mist humidifier especially during the winter months and when heat has been humid. 3. Use saline nose sprays frequently 4. Saline irrigations of the nose can be very helpful if done frequently.  * 4X daily for 1 week*  * Use of a nettie pot can be helpful with this. Follow directions with this* 5. Drink plenty of fluids 6. Keep thermostat turn down low 7.For any cough or congestion- tessalon perles as prescribed 8. For fever or aces or pains- take tylenol or ibuprofen appropriate for age and weight.  * for fevers greater than 101 orally you may alternate ibuprofen and tylenol every  3 hours.    Meds ordered this encounter  Medications   azithromycin (ZITHROMAX Z-PAK) 250 MG tablet    Sig: As directed    Dispense:  6 tablet    Refill:  0    Order Specific Question:   Supervising Provider    Answer:   Caryl Pina A [[id number]]   benzonatate (TESSALON PERLES)  100 MG capsule    Sig: Take 1 capsule (100 mg total) by mouth 3 (three) times daily as needed for cough.    Dispense:  20 capsule    Refill:  0    Order Specific Question:   Supervising Provider    Answer:   Worthy Rancher [[id number]]   [place], FNP

## 2021-05-26 ENCOUNTER — Ambulatory Visit: Payer: Self-pay | Admitting: *Deleted

## 2021-05-30 ENCOUNTER — Other Ambulatory Visit: Payer: Self-pay | Admitting: *Deleted

## 2021-05-30 NOTE — Patient Outreach (Signed)
[place] [facility]) Care Management  [place]  05/30/2021   Manuel Kline [birth year] [id number]  [place] telephone call to patient.  [employer] compliance verified. Per patient he is monitoring his blood sugar. Patient stated that he is having a lot of pain in his feet. He went to podiatrist and ordered diabetic shoes. Patient stated his gait is unsteady. Patient stated his legs feel weak. Patient has followed through with health maintenance. Patient has agreed to follow up outreach calls.   Encounter Medications:  Outpatient Encounter Medications as of 05/30/2021  Medication Sig Note   allopurinol (ZYLOPRIM) 300 MG tablet Take 1/2 tablet by mouth daily (Patient taking differently: Take 150 mg by mouth daily. Take 1/2 tablet by mouth daily)    atorvastatin (LIPITOR) 80 MG tablet Take 1/2 (one-half) tablet by mouth once daily (Patient taking differently: Take 40 mg by mouth daily. Take 1/2 (one-half) tablet by mouth once daily)    azithromycin (ZITHROMAX Z-PAK) 250 MG tablet As directed    benzonatate (TESSALON PERLES) 100 MG capsule Take 1 capsule (100 mg total) by mouth 3 (three) times daily as needed for cough.    cloNIDine (CATAPRES) 0.1 MG tablet Take 1 tablet by mouth twice daily    clopidogrel (PLAVIX) 75 MG tablet Take 1 tablet (75 mg total) by mouth daily.    Cyanocobalamin (VITAMIN B 12 PO) Take 1,000 mcg by mouth daily.    Dulaglutide (TRULICITY) 1.5 ZH/0.8MV SOPN Inject 1.5 mg into the skin once a week.    DULoxetine (CYMBALTA) 30 MG capsule Take 1 capsule (30 mg total) by mouth daily.    furosemide (LASIX) 40 MG tablet Take 1 tablet (40 mg total) by mouth 2 (two) times daily.    gabapentin (NEURONTIN) 300 MG capsule Take 1 capsule (300 mg total) by mouth 2 (two) times daily.    Gabapentin 10 % CREA Apply 1 application topically in the morning and at bedtime.    glucose blood (ACCU-CHEK GUIDE) test strip Check Glucose five times daily Dx E11.40    Insulin  Glargine (BASAGLAR KWIKPEN) 100 UNIT/ML SOPN Inject 0.25 mLs (25 Units total) into the skin daily. (Patient taking differently: Inject 32 Units into the skin daily.) 06/27/2019: Medication increased to 28 units    insulin regular (HUMULIN R) 100 units/mL injection Inject 0.3 mLs (30 Units total) into the skin 2 (two) times daily before a meal.    metoprolol succinate (TOPROL-XL) 50 MG 24 hr tablet Take 1 tablet (50 mg total) by mouth daily. Take with or immediately following a meal.    nitroGLYCERIN (NITROSTAT) 0.4 MG SL tablet Place 1 tablet (0.4 mg total) under the tongue every 5 (five) minutes as needed for chest pain. (Patient not taking: Reported on 05/15/2021)    Omega-3 Fatty Acids (FISH OIL) 1000 MG CPDR Take 1 tablet by mouth daily.    polyethylene glycol powder (GLYCOLAX/MIRALAX) powder Take 17 g by mouth 2 (two) times daily as needed. (Patient taking differently: Take 17 g by mouth 2 (two) times daily as needed for mild constipation or moderate constipation.)    quinapril (ACCUPRIL) 40 MG tablet Take 1 tablet (40 mg total) by mouth at bedtime.    No facility-administered encounter medications on file as of 05/30/2021.    Functional Status:  In your present state of health, do you have any difficulty performing the following activities: 12/09/2020  Hearing? Y  Comment wears hearing aids  Vision? N  Difficulty concentrating or making decisions?  N  Walking or climbing stairs? Y  Dressing or bathing? N  Doing errands, shopping? N  Preparing Food and eating ? N  Using the Toilet? N  In the past six months, have you accidently leaked urine? N  Do you have problems with loss of bowel control? N  Managing your Medications? N  Comment his son visits every morning and makes sure he and his wife take meds  Managing your Finances? N  Housekeeping or managing your Housekeeping? N  Some recent data might be hidden    Fall/Depression Screening: Fall Risk  05/15/2021 12/24/2020 12/09/2020   Falls in the past year? 0 0 1  Number falls in past yr: - - 0  Injury with Fall? - - 0  Comment - - -  Risk Factor Category  - - -  Risk for fall due to : - - History of fall(s);Impaired balance/gait;Orthopedic patient;Medication side effect  Risk for fall due to: Comment - - -  Follow up - - Falls prevention discussed;Education provided  Comment - - -   PHQ 2/9 Scores 05/15/2021 12/24/2020 12/09/2020 05/22/2020 04/02/2020 02/16/2020 11/15/2019  PHQ - 2 Score 0 0 0 0 0 0 0  PHQ- 9 Score - - - - - - -    Assessment:   Care Plan Care Plan : Diabetes Type 2 (Adult)  Updates made by Verlin Grills, RN since 05/30/2021 12:00 AM     Problem: Glycemic Management (Diabetes, Type 2)   Priority: High  Onset Date: 07/17/2020     Long-Range Goal: Glycemic Management Optimized   Start Date: 07/17/2020  Expected End Date: 08/29/2021  This Visit's Progress: On track  Recent Progress: On track  Priority: Medium  Note:   Evidence-based guidance:  Anticipate A1C testing (point-of-care) every 3 to 6 months based on goal attainment.  Review mutually-set A1C goal or target range.  Anticipate use of antihyperglycemic with or without insulin and periodic adjustments; consider active involvement of pharmacist.  Provide medical nutrition therapy and development of individualized eating.  Compare self-reported symptoms of hypo or hyperglycemia to blood glucose levels, diet and fluid intake, current medications, psychosocial and physiologic stressors, change in activity and barriers to care adherence.  Promote self-monitoring of blood glucose levels.  Assess and address barriers to management plan, such as food insecurity, age, developmental ability, depression, anxiety, fear of hypoglycemia or weight gain, as well as medication cost, side effects and complicated regimen.  Consider referral to community-based diabetes education program, visiting nurse, community health worker or health coach.   Encourage regular dental care for treatment of periodontal disease; refer to dental provider when needed.   Notes:  [id number] Patient is monitoring diet and A1C    Problem: Disease Progression (Diabetes, Type 2)   Priority: Medium  Onset Date: 07/17/2020     Long-Range Goal: Disease Progression Prevented or Minimized   Start Date: 07/17/2020  Expected End Date: 08/29/2021  This Visit's Progress: On track  Recent Progress: On track  Priority: Medium  Note:   Evidence-based guidance:  Prepare patient for laboratory and diagnostic exams based on risk and presentation.  Encourage lifestyle changes, such as increased intake of plant-based foods, stress reduction, consistent physical activity and smoking cessation to prevent long-term complications and chronic disease.   Individualize activity and exercise recommendations while considering potential limitations, such as neuropathy, retinopathy or the ability to prevent hyperglycemia or hypoglycemia.   Prepare patient for use of pharmacologic therapy that may include antihypertensive, analgesic, prostaglandin E1  with periodic adjustments, based on presenting chronic condition and laboratory results.  Assess signs/symptoms and risk factors for hypertension, sleep-disordered breathing, neuropathy (including changes in gait and balance), retinopathy, nephropathy and sexual dysfunction.  Address pregnancy planning and contraceptive choice, especially when prescribing antihypertensive or statin.  Ensure completion of annual comprehensive foot exam and dilated eye exam.   Implement additional individualized goals and interventions based on identified risk factors.  Prepare patient for consultation or referral for specialist care, such as ophthalmology, neurology, cardiology, podiatry, nephrology or perinatology.   Notes:  [id number] Patient is following through with health maintenance. New diabetic shoes, Eye exam done.  Patient will follow up with  getting flu vaccine and physical therapy      Goals Addressed             This Visit's Progress    (THN)Monitor and Manage My Blood Sugar   On track    Timeframe:  Long-Range Goal Priority:  High Start Date:     [id number]                        Expected End Date:  68115726                    Follow Up Date 20355974   - check blood sugar at prescribed times - check blood sugar if I feel it is too high or too low - enter blood sugar readings and medication or insulin into daily log - take the blood sugar log to all doctor visits - take the blood sugar meter to all doctor visits    Why is this important?   Checking your blood sugar at home helps to keep it from getting very high or very low.  Writing the results in a diary or log helps the doctor know how to care for you.  Your blood sugar log should have the time, date and the results.  Also, write down the amount of insulin or other medicine that you take.  Other information, like what you ate, exercise done and how you were feeling, will also be helpful.     Notes:  [id number] FBS 129 [id number] Patient is monitoring blood sugar and taking medications as per ordered     [facility] Eye Exam   On track    Timeframe:  Long-Range Goal Priority:  Medium Start Date:     2482500                        Expected End Date:     37048889                 Follow Up Date 16945038   - keep appointment with eye doctor - schedule appointment with eye doctor    Why is this important?   Eye check-ups are important when you have diabetes.  Vision loss can be prevented.    Note Last eye exam [id number] last eye exam 69794801     (THN)Perform Foot Care   On track    Timeframe:  Long-Range Goal Priority:  Medium Start Date:   65537482                          Expected End Date:    70786754                  Follow Up Date 49201007   -  check feet daily for cuts, sores or redness - trim toenails straight across - wash and dry  feet carefully every day - wear comfortable, cotton socks - wear comfortable, well-fitting shoes    Why is this important?   Good foot care is very important when you have diabetes.  There are many things you can do to keep your feet healthy and catch a problem Notes:  17616073 Podiatry visit and diabetic shoes ordered     (THN)Set My Target A1C   On track    Timeframe:  Long-Range Goal Priority:  High Start Date:        71062694                     Expected End Date:    85462703                  Follow Up Date 50093818   - set target A1C    Why is this important?   Your target A1C is decided together by you and your doctor.  It is based on several things like your age and other health issues.    Notes:  A1C 7.0 [id number] 8.4 A1C        Plan:  Follow-up: Follow-up in 3 month(s) Patient will follow up with Flu vaccine Patient will follow up with PCP for physical therapy RN sent educational material on A guide for your journey with diabetes Patient will follow up with diabetic shoes RN sent update assessment to PCP  Manuel Kline [place] [facility] [phone]

## 2021-05-30 NOTE — Patient Instructions (Signed)
Goals Addressed             This Visit's Progress    (THN)Monitor and Manage My Blood Sugar   On track    Timeframe:  Long-Range Goal Priority:  High Start Date:     35329924                        Expected End Date:  26834196                    Follow Up Date 22297989   - check blood sugar at prescribed times - check blood sugar if I feel it is too high or too low - enter blood sugar readings and medication or insulin into daily log - take the blood sugar log to all doctor visits - take the blood sugar meter to all doctor visits    Why is this important?   Checking your blood sugar at home helps to keep it from getting very high or very low.  Writing the results in a diary or log helps the doctor know how to care for you.  Your blood sugar log should have the time, date and the results.  Also, write down the amount of insulin or other medicine that you take.  Other information, like what you ate, exercise done and how you were feeling, will also be helpful.     Notes:  [id number] FBS 129 [id number] Patient is monitoring blood sugar and taking medications as per ordered     [facility] Eye Exam   On track    Timeframe:  Long-Range Goal Priority:  Medium Start Date:     5631497                        Expected End Date:     02637858                 Follow Up Date 85027741   - keep appointment with eye doctor - schedule appointment with eye doctor    Why is this important?   Eye check-ups are important when you have diabetes.  Vision loss can be prevented.    Note Last eye exam [id number] last eye exam 28366294     (THN)Perform Foot Care   On track    Timeframe:  Long-Range Goal Priority:  Medium Start Date:   76546503                          Expected End Date:    54656812                  Follow Up Date 75170017   - check feet daily for cuts, sores or redness - trim toenails straight across - wash and dry feet carefully every day - wear comfortable,  cotton socks - wear comfortable, well-fitting shoes    Why is this important?   Good foot care is very important when you have diabetes.  There are many things you can do to keep your feet healthy and catch a problem Notes:  [id number] Podiatry visit and diabetic shoes ordered     (THN)Set My Target A1C   On track    Timeframe:  Long-Range Goal Priority:  High Start Date:        91638466  Expected End Date:    37357897                  Follow Up Date 84784128   - set target A1C    Why is this important?   Your target A1C is decided together by you and your doctor.  It is based on several things like your age and other health issues.    Notes:  A1C 7.0 20813887 8.4 A1C

## 2021-06-03 DIAGNOSIS — M79676 Pain in unspecified toe(s): Secondary | ICD-10-CM | POA: Diagnosis not present

## 2021-06-03 DIAGNOSIS — B351 Tinea unguium: Secondary | ICD-10-CM | POA: Diagnosis not present

## 2021-06-03 DIAGNOSIS — E1142 Type 2 diabetes mellitus with diabetic polyneuropathy: Secondary | ICD-10-CM | POA: Diagnosis not present

## 2021-06-03 DIAGNOSIS — L84 Corns and callosities: Secondary | ICD-10-CM | POA: Diagnosis not present

## 2021-06-06 ENCOUNTER — Other Ambulatory Visit: Payer: Medicare Other

## 2021-06-16 ENCOUNTER — Ambulatory Visit (INDEPENDENT_AMBULATORY_CARE_PROVIDER_SITE_OTHER): Payer: Medicare Other

## 2021-06-16 ENCOUNTER — Other Ambulatory Visit: Payer: Self-pay

## 2021-06-16 DIAGNOSIS — Z23 Encounter for immunization: Secondary | ICD-10-CM

## 2021-06-17 ENCOUNTER — Encounter: Payer: Self-pay | Admitting: Podiatry

## 2021-06-17 ENCOUNTER — Ambulatory Visit (INDEPENDENT_AMBULATORY_CARE_PROVIDER_SITE_OTHER): Payer: Medicare Other | Admitting: Podiatry

## 2021-06-17 DIAGNOSIS — M216X1 Other acquired deformities of right foot: Secondary | ICD-10-CM | POA: Diagnosis not present

## 2021-06-17 DIAGNOSIS — E0843 Diabetes mellitus due to underlying condition with diabetic autonomic (poly)neuropathy: Secondary | ICD-10-CM

## 2021-06-17 DIAGNOSIS — E1342 Other specified diabetes mellitus with diabetic polyneuropathy: Secondary | ICD-10-CM | POA: Diagnosis not present

## 2021-06-17 DIAGNOSIS — M216X2 Other acquired deformities of left foot: Secondary | ICD-10-CM | POA: Diagnosis not present

## 2021-06-17 DIAGNOSIS — E1140 Type 2 diabetes mellitus with diabetic neuropathy, unspecified: Secondary | ICD-10-CM | POA: Diagnosis not present

## 2021-06-17 NOTE — Progress Notes (Signed)
  Subjective:  Patient ID: Manuel Kline, male    DOB: March 25, 1932,   MRN: [id number]  No chief complaint on file.   87 y.o. male presents for pick of diabetic shoes.  . Denies any other pedal complaints. Denies n/v/f/c.   Past Medical History:  Diagnosis Date   Adenomatous colon polyp 2006   CAD (coronary artery disease)    Calcium oxalate renal stones    Cardiomyopathy    Cataract    Cataract    Cerebral embolism with cerebral infarction 0/05/2724   Chronic systolic heart failure (HCC)         CKD (chronic kidney disease) stage 4, GFR 15-29 ml/min ([place]) 11/08/2017   Claudication in peripheral vascular disease ([place]) 07/23/2020   Diabetes ([place])    Diabetic peripheral neuropathy ([place]) 01/31/2015   Dual implantable cardioverter-defibrillator in situ    01/16/2002 Dr. Lovena Le  RA lead  Guidant [id number] RV lead  Guidant [id number] Generator  Guidant Prism  09/02/2009 Generator change Medtronic [id number]  SN  [id number]      Erectile dysfunction    Erosive esophagitis    Essential hypertension 11/30/2015   Hemorrhoids    History of TIA (transient ischemic attack) 01/19/2019   HLD (hyperlipidemia)    HTN (hypertension)    Hyperlipidemia    Hypertensive heart disease without CHF 07/31/2011   ICD (implantable cardiac defibrillator) in place    ICD dual chamber in situ    Ischemic cardiomyopathy    EF 40% June 2013    Left-sided weakness 6/43/3295   Metabolic syndrome    Moderate nonproliferative diabetic retinopathy of both eyes without macular edema associated with type 2 diabetes mellitus ([place]) 02/20/2020   Morbid obesity ([place])    OSA on CPAP 11/05/2014   Osteoarthritis    Posterior vitreous detachment of both eyes 02/20/2020   Presence of automatic (implantable) cardiac defibrillator 11/08/2017   S/P CABG (coronary artery bypass graft) 11/02/2000   Sleep apnea    Stroke-like symptoms 12/26/2018   Syncope 08/06/2009   Qualifier: Diagnosis of  By: Selena Batten CMA, Jewel     Type 2 diabetes,  uncontrolled, with neuropathy    Has retinopahty and neuropathy     Ventricular tachycardia (paroxysmal) 07/14/2016    Objective:  Physical Exam: Shoes fit well   Assessment:   1. Diabetes mellitus due to underlying condition with diabetic autonomic neuropathy, unspecified whether long term insulin use ([place])      Plan:  Patient was evaluated and treated and all questions answered. Diabetic shoe pickup  Return in one year for diabetic foot check.   Lorenda Peck, DPM

## 2021-07-02 ENCOUNTER — Other Ambulatory Visit: Payer: Self-pay

## 2021-07-02 ENCOUNTER — Encounter: Payer: Self-pay | Admitting: Family Medicine

## 2021-07-02 ENCOUNTER — Ambulatory Visit (INDEPENDENT_AMBULATORY_CARE_PROVIDER_SITE_OTHER): Payer: Medicare Other | Admitting: Family Medicine

## 2021-07-02 VITALS — BP 126/65 | HR 68 | Ht 68.0 in | Wt 229.0 lb

## 2021-07-02 DIAGNOSIS — E119 Type 2 diabetes mellitus without complications: Secondary | ICD-10-CM

## 2021-07-02 DIAGNOSIS — E1142 Type 2 diabetes mellitus with diabetic polyneuropathy: Secondary | ICD-10-CM | POA: Diagnosis not present

## 2021-07-02 DIAGNOSIS — E1169 Type 2 diabetes mellitus with other specified complication: Secondary | ICD-10-CM

## 2021-07-02 DIAGNOSIS — E785 Hyperlipidemia, unspecified: Secondary | ICD-10-CM | POA: Diagnosis not present

## 2021-07-02 DIAGNOSIS — N184 Chronic kidney disease, stage 4 (severe): Secondary | ICD-10-CM | POA: Diagnosis not present

## 2021-07-02 DIAGNOSIS — E782 Mixed hyperlipidemia: Secondary | ICD-10-CM

## 2021-07-02 LAB — BAYER DCA HB A1C WAIVED: HB A1C (BAYER DCA - WAIVED): 7.9 % — ABNORMAL HIGH (ref 4.8–5.6)

## 2021-07-02 NOTE — Progress Notes (Signed)
BP 126/65   Pulse 68   Ht 5' 8"  (1.727 m)   Wt 229 lb (103.9 kg)   SpO2 98%   BMI 34.82 kg/m    Subjective:   Patient ID: Manuel Kline, male    DOB: [birth year], 87 y.o.   MRN: [id number]  HPI: Manuel Kline is a 87 y.o. male presenting on 07/02/2021 for Medical Management of Chronic Issues, Diabetes, and Joint Pain (Bilateral shoulders and left hip/buttock)   HPI Type 2 diabetes mellitus Patient comes in today for recheck of his diabetes. Patient has been currently taking Basaglar and Humulin and Trulicity. Patient is currently on an ACE inhibitor/ARB. Patient has seen an ophthalmologist this year. Patient denies any issues with their feet. The symptom started onset as an adult hypertension and hyperlipidemia ARE RELATED TO DM   Hyperlipidemia Patient is coming in for recheck of his hyperlipidemia. The patient is currently taking atorvastatin and fish oils. They deny any issues with myalgias or history of liver damage from it. They deny any focal numbness or weakness or chest pain.   Hypertension Patient is currently on clonidine and furosemide and metoprolol and quinapril, and their blood pressure today is 126/65. Patient denies any lightheadedness or dizziness. Patient denies headaches, blurred vision, chest pains, shortness of breath, or weakness. Denies any side effects from medication and is content with current medication.   Relevant past medical, surgical, family and social history reviewed and updated as indicated. Interim medical history since our last visit reviewed. Allergies and medications reviewed and updated.  Review of Systems  Constitutional:  Negative for chills and fever.  Eyes:  Negative for visual disturbance.  Respiratory:  Negative for shortness of breath and wheezing.   Cardiovascular:  Negative for chest pain and leg swelling.  Musculoskeletal:  Positive for back pain and gait problem.  Skin:  Negative for rash.  Neurological:  Positive for weakness.  All  other systems reviewed and are negative.  Per HPI unless specifically indicated above   Allergies as of 07/02/2021   No Known Allergies      Medication List        Accurate as of July 02, 2021  2:19 PM. If you have any questions, ask your nurse or doctor.          Accu-Chek Guide test strip Generic drug: glucose blood Check Glucose five times daily Dx E11.40   allopurinol 300 MG tablet Commonly known as: ZYLOPRIM Take 1/2 tablet by mouth daily What changed:  how much to take how to take this when to take this   atorvastatin 80 MG tablet Commonly known as: LIPITOR Take 1/2 (one-half) tablet by mouth once daily What changed:  how much to take how to take this when to take this   azithromycin 250 MG tablet Commonly known as: Zithromax Z-Pak As directed   Basaglar KwikPen 100 UNIT/ML Inject 0.25 mLs (25 Units total) into the skin daily. What changed: how much to take   benzonatate 100 MG capsule Commonly known as: Tessalon Perles Take 1 capsule (100 mg total) by mouth 3 (three) times daily as needed for cough.   cloNIDine 0.1 MG tablet Commonly known as: CATAPRES Take 1 tablet by mouth twice daily   clopidogrel 75 MG tablet Commonly known as: PLAVIX Take 1 tablet (75 mg total) by mouth daily.   DULoxetine 30 MG capsule Commonly known as: CYMBALTA Take 1 capsule (30 mg total) by mouth daily.   Fish Oil 1000 MG Cpdr  Take 1 tablet by mouth daily.   furosemide 40 MG tablet Commonly known as: LASIX Take 1 tablet (40 mg total) by mouth 2 (two) times daily.   Gabapentin 10 % Crea Apply 1 application topically in the morning and at bedtime.   gabapentin 300 MG capsule Commonly known as: NEURONTIN Take 1 capsule (300 mg total) by mouth 2 (two) times daily.   insulin regular 100 units/mL injection Commonly known as: HumuLIN R Inject 0.3 mLs (30 Units total) into the skin 2 (two) times daily before a meal.   metoprolol succinate 50 MG 24 hr  tablet Commonly known as: TOPROL-XL Take 1 tablet (50 mg total) by mouth daily. Take with or immediately following a meal.   nitroGLYCERIN 0.4 MG SL tablet Commonly known as: NITROSTAT Place 1 tablet (0.4 mg total) under the tongue every 5 (five) minutes as needed for chest pain.   polyethylene glycol powder 17 GM/SCOOP powder Commonly known as: GLYCOLAX/MIRALAX Take 17 g by mouth 2 (two) times daily as needed. What changed: reasons to take this   quinapril 40 MG tablet Commonly known as: ACCUPRIL Take 1 tablet (40 mg total) by mouth at bedtime.   Trulicity 1.5 EX/5.2WU Sopn Generic drug: Dulaglutide Inject 1.5 mg into the skin once a week.   VITAMIN B 12 PO Take 1,000 mcg by mouth daily.         Objective:   BP 126/65   Pulse 68   Ht 5' 8"  (1.727 m)   Wt 229 lb (103.9 kg)   SpO2 98%   BMI 34.82 kg/m   Wt Readings from Last 3 Encounters:  07/02/21 229 lb (103.9 kg)  05/15/21 228 lb (103.4 kg)  03/31/21 228 lb (103.4 kg)    Physical Exam Vitals and nursing note reviewed.  Constitutional:      General: He is not in acute distress.    Appearance: He is well-developed. He is not diaphoretic.  Eyes:     General: No scleral icterus.       Right eye: No discharge.     Conjunctiva/sclera: Conjunctivae normal.     Pupils: Pupils are equal, round, and reactive to light.  Neck:     Thyroid: No thyromegaly.  Cardiovascular:     Rate and Rhythm: Normal rate and regular rhythm.     Heart sounds: Normal heart sounds. No murmur heard. Pulmonary:     Effort: Pulmonary effort is normal. No respiratory distress.     Breath sounds: Normal breath sounds. No wheezing.  Musculoskeletal:        General: Normal range of motion.     Cervical back: Neck supple.  Lymphadenopathy:     Cervical: No cervical adenopathy.  Skin:    General: Skin is warm and dry.     Findings: No rash.  Neurological:     Mental Status: He is alert and oriented to person, place, and time.      Coordination: Coordination normal.  Psychiatric:        Behavior: Behavior normal.      Assessment & Plan:   Problem List Items Addressed This Visit       Endocrine   Type 2 diabetes mellitus with other specified complication ([place])   Diabetic peripheral neuropathy ([place])     Genitourinary   CKD (chronic kidney disease) stage 4, GFR 15-29 ml/min (HCC)   Relevant Orders   CBC with Differential/Platelet   CMP14+EGFR   Lipid panel   PSA, total and free  Bayer DCA Hb A1c Waived     Other   Hyperlipidemia - Primary (Chronic)   Relevant Orders   CBC with Differential/Platelet   CMP14+EGFR   Lipid panel   PSA, total and free   Bayer DCA Hb A1c Waived   CBC with Differential/Platelet   CMP14+EGFR   Lipid panel   PSA, total and free   Bayer DCA Hb A1c Waived   Other Visit Diagnoses     Type 2 diabetes mellitus without complication, without long-term current use of insulin (HCC)       Relevant Orders   CBC with Differential/Platelet   CMP14+EGFR   Lipid panel   PSA, total and free   Bayer DCA Hb A1c Waived       Continue current medicine.  Patient will do blood work.  A1c was improved at 7.9, will increase Trulicity to 3 mg from 1.5.  He gets this sent to [facility] Follow up plan: Return in about 3 months (around 10/02/2021), or if symptoms worsen or fail to improve, for Diabetes.  Counseling provided for all of the vaccine components Orders Placed This Encounter  Procedures   CBC with Differential/Platelet   CMP14+EGFR   Lipid panel   PSA, total and free   Bayer DCA Hb A1c Waived    Caryl Pina, MD [facility] 07/02/2021, 2:19 PM

## 2021-07-03 LAB — CBC WITH DIFFERENTIAL/PLATELET
Basophils Absolute: 0 10*3/uL (ref 0.0–0.2)
Basos: 0 %
EOS (ABSOLUTE): 0.2 10*3/uL (ref 0.0–0.4)
Eos: 3 %
Hematocrit: 40 % (ref 37.5–51.0)
Hemoglobin: 13.9 g/dL (ref 13.0–17.7)
Immature Grans (Abs): 0 10*3/uL (ref 0.0–0.1)
Immature Granulocytes: 0 %
Lymphocytes Absolute: 2.3 10*3/uL (ref 0.7–3.1)
Lymphs: 30 %
MCH: 31.1 pg (ref 26.6–33.0)
MCHC: 34.8 g/dL (ref 31.5–35.7)
MCV: 90 fL (ref 79–97)
Monocytes Absolute: 0.7 10*3/uL (ref 0.1–0.9)
Monocytes: 9 %
Neutrophils Absolute: 4.5 10*3/uL (ref 1.4–7.0)
Neutrophils: 58 %
Platelets: 211 10*3/uL (ref 150–450)
RBC: 4.47 x10E6/uL (ref 4.14–5.80)
RDW: 14.4 % (ref 11.6–15.4)
WBC: 7.7 10*3/uL (ref 3.4–10.8)

## 2021-07-03 LAB — CMP14+EGFR
ALT: 29 IU/L (ref 0–44)
AST: 30 IU/L (ref 0–40)
Albumin/Globulin Ratio: 1.8 (ref 1.2–2.2)
Albumin: 4.4 g/dL (ref 3.6–4.6)
Alkaline Phosphatase: 117 IU/L (ref 44–121)
BUN/Creatinine Ratio: 19 (ref 10–24)
BUN: 37 mg/dL — ABNORMAL HIGH (ref 8–27)
Bilirubin Total: 0.6 mg/dL (ref 0.0–1.2)
CO2: 25 mmol/L (ref 20–29)
Calcium: 10 mg/dL (ref 8.6–10.2)
Chloride: 96 mmol/L (ref 96–106)
Creatinine, Ser: 1.96 mg/dL — ABNORMAL HIGH (ref 0.76–1.27)
Globulin, Total: 2.5 g/dL (ref 1.5–4.5)
Glucose: 95 mg/dL (ref 70–99)
Potassium: 4.7 mmol/L (ref 3.5–5.2)
Sodium: 137 mmol/L (ref 134–144)
Total Protein: 6.9 g/dL (ref 6.0–8.5)
eGFR: 32 mL/min/{1.73_m2} — ABNORMAL LOW (ref >59)

## 2021-07-03 LAB — LIPID PANEL
Chol/HDL Ratio: 4 ratio (ref 0.0–5.0)
Cholesterol, Total: 152 mg/dL (ref 100–199)
HDL: 38 mg/dL — ABNORMAL LOW (ref >39)
LDL Chol Calc (NIH): 74 mg/dL (ref 0–99)
Triglycerides: 244 mg/dL — ABNORMAL HIGH (ref 0–149)
VLDL Cholesterol Cal: 40 mg/dL (ref 5–40)

## 2021-07-03 LAB — PSA, TOTAL AND FREE
PSA, Free Pct: 12.7 %
PSA, Free: 0.14 ng/mL
Prostate Specific Ag, Serum: 1.1 ng/mL (ref 0.0–4.0)

## 2021-07-15 ENCOUNTER — Encounter: Payer: Self-pay | Admitting: Internal Medicine

## 2021-07-15 ENCOUNTER — Ambulatory Visit: Payer: Medicare Other | Admitting: Internal Medicine

## 2021-07-15 ENCOUNTER — Other Ambulatory Visit: Payer: Self-pay

## 2021-07-15 VITALS — BP 130/76 | HR 84 | Ht 64.0 in | Wt 232.8 lb

## 2021-07-15 DIAGNOSIS — I5022 Chronic systolic (congestive) heart failure: Secondary | ICD-10-CM | POA: Diagnosis not present

## 2021-07-15 DIAGNOSIS — Z95810 Presence of automatic (implantable) cardiac defibrillator: Secondary | ICD-10-CM

## 2021-07-15 DIAGNOSIS — I255 Ischemic cardiomyopathy: Secondary | ICD-10-CM | POA: Diagnosis not present

## 2021-07-15 DIAGNOSIS — I10 Essential (primary) hypertension: Secondary | ICD-10-CM | POA: Diagnosis not present

## 2021-07-15 DIAGNOSIS — I4729 Other ventricular tachycardia: Secondary | ICD-10-CM | POA: Diagnosis not present

## 2021-07-15 NOTE — Patient Instructions (Addendum)
Medication Instructions:  Your physician recommends that you continue on your current medications as directed. Please refer to the Current Medication list given to you today.  Labwork: None ordered.  Testing/Procedures: None ordered.  Follow-Up: Your physician wants you to follow-up in: one year with Manuel Peru, MD   Remote monitoring is used to monitor your ICD from home. This monitoring reduces the number of office visits required to check your device to one time per year. It allows [place] to keep an eye on the functioning of your device to ensure it is working properly. You are scheduled for a device check from home on 07/23/2021. You may send your transmission at any time that day. If you have a wireless device, the transmission will be sent automatically. After your physician reviews your transmission, you will receive a postcard with your next transmission date.  Any Other Special Instructions Will Be Listed Below (If Applicable).  If you need a refill on your cardiac medications before your next appointment, please call your pharmacy.

## 2021-07-15 NOTE — Progress Notes (Signed)
HPI Manuel Kline returns today for followup. He is a pleasant 87 yo man with a h/o HTN, CAD, s/p CABG, OSA, VT s/p ICD insertion. In the interim, he has been stable but does note some problems with peripheral neuropathy. No chest pain. No edema. He gained 6 lbs since his last visit. No Known Allergies   Current Outpatient Medications  Medication Sig Dispense Refill   atorvastatin (LIPITOR) 80 MG tablet Take 1/2 (one-half) tablet by mouth once daily (Patient taking differently: Take 40 mg by mouth daily. Take 1/2 (one-half) tablet by mouth once daily) 45 tablet 3   azithromycin (ZITHROMAX Z-PAK) 250 MG tablet As directed 6 tablet 0   cloNIDine (CATAPRES) 0.1 MG tablet Take 1 tablet by mouth twice daily 180 tablet 0   clopidogrel (PLAVIX) 75 MG tablet Take 1 tablet (75 mg total) by mouth daily. 90 tablet 3   Cyanocobalamin (VITAMIN B 12 PO) Take 1,000 mcg by mouth daily.     Dulaglutide (TRULICITY) 1.5 MB/5.5HR SOPN Inject 1.5 mg into the skin once a week. 3 mL 11   DULoxetine (CYMBALTA) 30 MG capsule Take 1 capsule (30 mg total) by mouth daily. 90 capsule 3   furosemide (LASIX) 40 MG tablet Take 1 tablet (40 mg total) by mouth 2 (two) times daily. 180 tablet 3   gabapentin (NEURONTIN) 300 MG capsule Take 1 capsule (300 mg total) by mouth 2 (two) times daily. 180 capsule 3   glucose blood (ACCU-CHEK GUIDE) test strip Check Glucose five times daily Dx E11.40 400 each 5   Insulin Glargine (BASAGLAR KWIKPEN) 100 UNIT/ML SOPN Inject 0.25 mLs (25 Units total) into the skin daily. (Patient taking differently: Inject 32 Units into the skin daily.)     insulin regular (HUMULIN R) 100 units/mL injection Inject 0.3 mLs (30 Units total) into the skin 2 (two) times daily before a meal. 60 mL 3   metoprolol succinate (TOPROL-XL) 50 MG 24 hr tablet Take 1 tablet (50 mg total) by mouth daily. Take with or immediately following a meal. 90 tablet 3   nitroGLYCERIN (NITROSTAT) 0.4 MG SL tablet Place 1 tablet  (0.4 mg total) under the tongue every 5 (five) minutes as needed for chest pain. 25 tablet 0   Omega-3 Fatty Acids (FISH OIL) 1000 MG CPDR Take 1 tablet by mouth daily.     polyethylene glycol powder (GLYCOLAX/MIRALAX) powder Take 17 g by mouth 2 (two) times daily as needed. (Patient taking differently: Take 17 g by mouth 2 (two) times daily as needed for mild constipation or moderate constipation.) 3350 g 1   quinapril (ACCUPRIL) 40 MG tablet Take 1 tablet (40 mg total) by mouth at bedtime. 90 tablet 3   allopurinol (ZYLOPRIM) 300 MG tablet Take 1/2 tablet by mouth daily (Patient not taking: Reported on 07/15/2021) 45 tablet 3   benzonatate (TESSALON PERLES) 100 MG capsule Take 1 capsule (100 mg total) by mouth 3 (three) times daily as needed for cough. (Patient not taking: Reported on 07/15/2021) 20 capsule 0   Gabapentin 10 % CREA Apply 1 application topically in the morning and at bedtime. (Patient not taking: Reported on 07/15/2021) 30 g 3   No current facility-administered medications for this visit.     Past Medical History:  Diagnosis Date   Adenomatous colon polyp 2006   CAD (coronary artery disease)    Calcium oxalate renal stones    Cardiomyopathy    Cataract    Cataract  Cerebral embolism with cerebral infarction 5/59/7416   Chronic systolic heart failure (HCC)         CKD (chronic kidney disease) stage 4, GFR 15-29 ml/min (HCC) 11/08/2017   Claudication in peripheral vascular disease ([place]) 07/23/2020   Diabetes ([place])    Diabetic peripheral neuropathy ([place]) 01/31/2015   Dual implantable cardioverter-defibrillator in situ    01/16/2002 Dr. Lovena Le  RA lead  Guidant [id number] RV lead  Guidant [id number] Generator  Guidant Prism  09/02/2009 Generator change Medtronic [id number]  SN  [id number]      Erectile dysfunction    Erosive esophagitis    Essential hypertension 11/30/2015   Hemorrhoids    History of TIA (transient ischemic attack) 01/19/2019   HLD (hyperlipidemia)    HTN  (hypertension)    Hyperlipidemia    Hypertensive heart disease without CHF 07/31/2011   ICD (implantable cardiac defibrillator) in place    ICD dual chamber in situ    Ischemic cardiomyopathy    EF 40% June 2013    Left-sided weakness 0/10/7046   Metabolic syndrome    Moderate nonproliferative diabetic retinopathy of both eyes without macular edema associated with type 2 diabetes mellitus ([place]) 02/20/2020   Morbid obesity ([place])    OSA on CPAP 11/05/2014   Osteoarthritis    Posterior vitreous detachment of both eyes 02/20/2020   Presence of automatic (implantable) cardiac defibrillator 11/08/2017   S/P CABG (coronary artery bypass graft) 11/02/2000   Sleep apnea    Stroke-like symptoms 12/26/2018   Syncope 08/06/2009   Qualifier: Diagnosis of  By: Selena Batten CMA, Jewel     Type 2 diabetes, uncontrolled, with neuropathy    Has retinopahty and neuropathy     Ventricular tachycardia (paroxysmal) 07/14/2016    ROS:   All systems reviewed and negative except as noted in the HPI.   Past Surgical History:  Procedure Laterality Date   ABDOMINAL EXPLORATION SURGERY     BACK SURGERY     X'3   cardiac bypass     CARDIAC DEFIBRILLATOR PLACEMENT     CARPAL TUNNEL RELEASE     X2, bilateral   CATARACT EXTRACTION     COLONOSCOPY  06/20/2012   Procedure: COLONOSCOPY;  Surgeon: Sable Feil, MD;  Location: [facility];  Service: Endoscopy;  Laterality: N/A;   DOPPLER ECHOCARDIOGRAPHY  2003   EP IMPLANTABLE DEVICE N/A 07/14/2016   Procedure: ICD Generator Changeout;  Surgeon: Evans Lance, MD;  Location: [facility];  Service: Cardiovascular;  Laterality: N/A;   ESOPHAGOGASTRODUODENOSCOPY  06/20/2012   Procedure: ESOPHAGOGASTRODUODENOSCOPY (EGD);  Surgeon: Sable Feil, MD;  Location: [facility];  Service: Endoscopy;  Laterality: N/A;   EYE SURGERY     LAPAROTOMY     RETINOPATHY SURGERY Bilateral    rotator cuff surgery     left     Family History  Problem Relation Age of  Onset   Diabetes Brother    Heart disease Father    Heart disease Mother    Diabetes Mother    Diabetes Brother    Diabetes Brother    Cancer Brother        baldder   Heart disease Brother    Diabetes Sister    Diabetes Sister    Kidney disease Sister        dialysis   Diabetes Sister    Diabetes Sister    Diabetes Sister    Throat cancer Paternal Uncle      Social History  Socioeconomic History   Marital status: Married    Spouse name: Alice   Number of children: 4   Years of education: 11   Highest education level: 11th grade  Occupational History   Occupation: Retired from [employer]: RETIRED  Tobacco Use   Smoking status: Never   Smokeless tobacco: Never  Vaping Use   Vaping Use: Never used  Substance and Sexual Activity   Alcohol use: No    Alcohol/week: 0.0 standard drinks   Drug use: No   Sexual activity: Yes  Other Topics Concern   Not on file  Social History Narrative   Right handed, Married, 4 kids from previous marriage.  Retired.  Caffeine 1 cup daily.   Social Determinants of Health   Financial Resource Strain: Low Risk    Difficulty of Paying Living Expenses: Not hard at all  Food Insecurity: No Food Insecurity   Worried About Charity fundraiser in the Last Year: Never true   [place] in the Last Year: Never true  Transportation Needs: No Transportation Needs   Lack of Transportation (Medical): No   Lack of Transportation (Non-Medical): No  Physical Activity: Insufficiently Active   Days of Exercise per Week: 4 days   Minutes of Exercise per Session: 20 min  Stress: No Stress Concern Present   Feeling of Stress : Not at all  Social Connections: Socially Integrated   Frequency of Communication with Friends and Family: More than three times a week   Frequency of Social Gatherings with Friends and Family: More than three times a week   Attends Religious Services: More than 4 times per year   Active Member of  [employer] or Organizations: Yes   Attends Music therapist: More than 4 times per year   Marital Status: Married  Human resources officer Violence: Not At Risk   Fear of Current or Ex-Partner: No   Emotionally Abused: No   Physically Abused: No   Sexually Abused: No     BP 130/76   Pulse 84   Ht 5\' 4"  (1.626 m)   Wt 232 lb 12.8 oz (105.6 kg)   SpO2 98%   BMI 39.96 kg/m   Physical Exam:  Well appearing NAD HEENT: Unremarkable Neck:  No JVD, no thyromegally Lymphatics:  No adenopathy Back:  No CVA tenderness Lungs:  Clear with no wheezes HEART:  Regular rate rhythm, no murmurs, no rubs, no clicks Abd:  soft, positive bowel sounds, no organomegally, no rebound, no guarding Ext:  2 plus pulses, no edema, no cyanosis, no clubbing Skin:  No rashes no nodules Neuro:  CN II through XII intact, motor grossly intact  DEVICE  Normal device function.  See PaceArt for details.   Assess/Plan:  VT - he has had no additional VT. He will undergo watchful waiting. No change in his meds. CAD - he denies anginal symptoms. Continue current meds. Obesity - I encouraged him to avoid sweets. His daughter notes he has lots of pies and cakes. Chronic systolic heart failure - his symptoms remain class 2. I encouraged him to walk more despite his neuropathy.   Carleene Overlie Heike Pounds,MD

## 2021-07-18 LAB — CUP PACEART INCLINIC DEVICE CHECK
Battery Remaining Longevity: 55 mo
Battery Voltage: 2.97 V
Brady Statistic AP VP Percent: 0.21 %
Brady Statistic AP VS Percent: 76.49 %
Brady Statistic AS VP Percent: 0.02 %
Brady Statistic AS VS Percent: 23.29 %
Brady Statistic RA Percent Paced: 76.67 %
Brady Statistic RV Percent Paced: 0.22 %
Date Time Interrogation Session: 20221115205500
HighPow Impedance: 52 Ohm
HighPow Impedance: 71 Ohm
Implantable Lead Implant Date: 20030519
Implantable Lead Implant Date: 20030519
Implantable Lead Location: 753859
Implantable Lead Location: 753860
Implantable Lead Model: 158
Implantable Lead Model: 4087
Implantable Lead Serial Number: 115102
Implantable Lead Serial Number: 159999
Implantable Pulse Generator Implant Date: 20171114
Lead Channel Impedance Value: 4047 Ohm
Lead Channel Impedance Value: 4047 Ohm
Lead Channel Impedance Value: 4047 Ohm
Lead Channel Impedance Value: 437 Ohm
Lead Channel Impedance Value: 437 Ohm
Lead Channel Impedance Value: 456 Ohm
Lead Channel Pacing Threshold Amplitude: 1 V
Lead Channel Pacing Threshold Amplitude: 1.25 V
Lead Channel Pacing Threshold Pulse Width: 0.4 ms
Lead Channel Pacing Threshold Pulse Width: 0.4 ms
Lead Channel Sensing Intrinsic Amplitude: 11 mV
Lead Channel Sensing Intrinsic Amplitude: 14.125 mV
Lead Channel Sensing Intrinsic Amplitude: 2.25 mV
Lead Channel Sensing Intrinsic Amplitude: 2.5 mV
Lead Channel Setting Pacing Amplitude: 2.5 V
Lead Channel Setting Pacing Amplitude: 2.5 V
Lead Channel Setting Pacing Pulse Width: 0.4 ms
Lead Channel Setting Sensing Sensitivity: 0.3 mV

## 2021-07-22 ENCOUNTER — Telehealth: Payer: Self-pay | Admitting: Family Medicine

## 2021-07-22 DIAGNOSIS — G4733 Obstructive sleep apnea (adult) (pediatric): Secondary | ICD-10-CM

## 2021-07-22 NOTE — Telephone Encounter (Signed)
Pt needs a new referral to [facility]  REFERRAL REQUEST Telephone Note  Have you been seen at our office for this problem? No (Advise that they may need an appointment with their PCP before a referral can be done)  Reason for Referral: for new sleep machine and to see neurologist. Pt called that office to schedule an apt and was told to call pcp for a referral Referral discussed with patient: no  Best contact number of patient for referral team: [phone]    Has patient been seen by a specialist for this issue before: yes  Patient provider preference for referral: [facility] [phone] Patient location preference for referral: [facility] [phone]   Patient notified that referrals can take up to a week or longer to process. If they haven't heard anything within a week they should call back and speak with the referral department.

## 2021-07-23 ENCOUNTER — Ambulatory Visit (INDEPENDENT_AMBULATORY_CARE_PROVIDER_SITE_OTHER): Payer: Medicare Other

## 2021-07-23 DIAGNOSIS — I255 Ischemic cardiomyopathy: Secondary | ICD-10-CM | POA: Diagnosis not present

## 2021-07-23 NOTE — Telephone Encounter (Signed)
Placed referral for the patient, he should be contacted by them

## 2021-07-27 LAB — CUP PACEART REMOTE DEVICE CHECK
Battery Remaining Longevity: 54 mo
Battery Voltage: 2.95 V
Brady Statistic AP VP Percent: 0.4 %
Brady Statistic AP VS Percent: 80.91 %
Brady Statistic AS VP Percent: 0.02 %
Brady Statistic AS VS Percent: 18.67 %
Brady Statistic RA Percent Paced: 81.31 %
Brady Statistic RV Percent Paced: 0.41 %
Date Time Interrogation Session: 20221124125304
HighPow Impedance: 51 Ohm
HighPow Impedance: 69 Ohm
Implantable Lead Implant Date: 20030519
Implantable Lead Implant Date: 20030519
Implantable Lead Location: 753859
Implantable Lead Location: 753860
Implantable Lead Model: 158
Implantable Lead Model: 4087
Implantable Lead Serial Number: 115102
Implantable Lead Serial Number: 159999
Implantable Pulse Generator Implant Date: 20171114
Lead Channel Impedance Value: 4047 Ohm
Lead Channel Impedance Value: 4047 Ohm
Lead Channel Impedance Value: 4047 Ohm
Lead Channel Impedance Value: 437 Ohm
Lead Channel Impedance Value: 494 Ohm
Lead Channel Impedance Value: 494 Ohm
Lead Channel Pacing Threshold Amplitude: 0.875 V
Lead Channel Pacing Threshold Amplitude: 1.25 V
Lead Channel Pacing Threshold Pulse Width: 0.4 ms
Lead Channel Pacing Threshold Pulse Width: 0.4 ms
Lead Channel Sensing Intrinsic Amplitude: 12 mV
Lead Channel Sensing Intrinsic Amplitude: 12 mV
Lead Channel Sensing Intrinsic Amplitude: 2.5 mV
Lead Channel Sensing Intrinsic Amplitude: 2.5 mV
Lead Channel Setting Pacing Amplitude: 2.5 V
Lead Channel Setting Pacing Amplitude: 2.5 V
Lead Channel Setting Pacing Pulse Width: 0.4 ms
Lead Channel Setting Sensing Sensitivity: 0.3 mV

## 2021-07-28 ENCOUNTER — Ambulatory Visit: Payer: Medicare Other | Admitting: Cardiology

## 2021-07-28 ENCOUNTER — Other Ambulatory Visit: Payer: Self-pay

## 2021-07-28 ENCOUNTER — Encounter: Payer: Self-pay | Admitting: Cardiology

## 2021-07-28 VITALS — BP 130/90 | HR 64 | Ht 65.0 in | Wt 229.0 lb

## 2021-07-28 DIAGNOSIS — E1169 Type 2 diabetes mellitus with other specified complication: Secondary | ICD-10-CM | POA: Diagnosis not present

## 2021-07-28 DIAGNOSIS — I255 Ischemic cardiomyopathy: Secondary | ICD-10-CM | POA: Diagnosis not present

## 2021-07-28 DIAGNOSIS — I10 Essential (primary) hypertension: Secondary | ICD-10-CM

## 2021-07-28 DIAGNOSIS — N184 Chronic kidney disease, stage 4 (severe): Secondary | ICD-10-CM

## 2021-07-28 NOTE — Progress Notes (Signed)
Cardiology Office Note:    Date:  07/28/2021   ID:  Manuel Kline, DOB November 26, 1931, MRN [id number]  PCP:  Dettinger, Manuel Kaufmann, MD  Cardiologist:  Jenne Campus, MD    Referring MD: Dettinger, Manuel Kaufmann, MD   Chief Complaint  Patient presents with   Follow-up  I am doing well  History of Present Illness:    Manuel Kline is a 87 y.o. male  with past medical history significant for coronary artery disease, status post coronary artery bypass graft many years ago, diastolic congestive heart failure, cardiomyopathy with ejection fraction 45 to 50%, ICD present, obstructive sleep apnea, essential hypertension, longstanding diabetes for more than 25 years .  28 July 2021: Overall he is doing very well.  He forgot his hearing intubated for conversation with him was difficult but he is daughter provided all information.  He denies have any chest pain tightness squeezing pressure burning chest.  Because of neuropathy, balance problem therefore he walks with a cane.  He laughed Christmas and usually create a lot of decorations however this time he decided to slow down and scale down.  Denies have any cardiac complaints.  There is no chest pain tightness squeezing pressure burning chest.   Past Medical History:  Diagnosis Date   Adenomatous colon polyp 2006   CAD (coronary artery disease)    Calcium oxalate renal stones    Cardiomyopathy    Cataract    Cataract    Cerebral embolism with cerebral infarction 0/17/4944   Chronic systolic heart failure (HCC)         CKD (chronic kidney disease) stage 4, GFR 15-29 ml/min ([place]) 11/08/2017   Claudication in peripheral vascular disease ([place]) 07/23/2020   Diabetes ([place])    Diabetic peripheral neuropathy ([place]) 01/31/2015   Dual implantable cardioverter-defibrillator in situ    01/16/2002 Dr. Lovena Le  RA lead  Guidant [id number] RV lead  Guidant [id number] Generator  Guidant Prism  09/02/2009 Generator change Medtronic [id number]  SN  [id number]       Erectile dysfunction    Erosive esophagitis    Essential hypertension 11/30/2015   Hemorrhoids    History of TIA (transient ischemic attack) 01/19/2019   HLD (hyperlipidemia)    HTN (hypertension)    Hyperlipidemia    Hypertensive heart disease without CHF 07/31/2011   ICD (implantable cardiac defibrillator) in place    ICD dual chamber in situ    Ischemic cardiomyopathy    EF 40% June 2013    Left-sided weakness 1/77/9390   Metabolic syndrome    Moderate nonproliferative diabetic retinopathy of both eyes without macular edema associated with type 2 diabetes mellitus ([place]) 02/20/2020   Morbid obesity ([place])    OSA on CPAP 11/05/2014   Osteoarthritis    Posterior vitreous detachment of both eyes 02/20/2020   Presence of automatic (implantable) cardiac defibrillator 11/08/2017   S/P CABG (coronary artery bypass graft) 11/02/2000   Sleep apnea    Stroke-like symptoms 12/26/2018   Syncope 08/06/2009   Qualifier: Diagnosis of  By: Selena Batten CMA, [place]     Type 2 diabetes, uncontrolled, with neuropathy    Has retinopahty and neuropathy     Ventricular tachycardia (paroxysmal) 07/14/2016    Past Surgical History:  Procedure Laterality Date   ABDOMINAL EXPLORATION SURGERY     BACK SURGERY     X'3   cardiac bypass     CARDIAC DEFIBRILLATOR PLACEMENT     CARPAL TUNNEL RELEASE     X2,  bilateral   CATARACT EXTRACTION     COLONOSCOPY  06/20/2012   Procedure: COLONOSCOPY;  Surgeon: Sable Feil, MD;  Location: [facility];  Service: Endoscopy;  Laterality: N/A;   DOPPLER ECHOCARDIOGRAPHY  2003   EP IMPLANTABLE DEVICE N/A 07/14/2016   Procedure: ICD Generator Changeout;  Surgeon: Evans Lance, MD;  Location: [facility];  Service: Cardiovascular;  Laterality: N/A;   ESOPHAGOGASTRODUODENOSCOPY  06/20/2012   Procedure: ESOPHAGOGASTRODUODENOSCOPY (EGD);  Surgeon: Sable Feil, MD;  Location: [facility];  Service: Endoscopy;  Laterality: N/A;   EYE SURGERY     LAPAROTOMY      RETINOPATHY SURGERY Bilateral    rotator cuff surgery     left    Current Medications: Current Meds  Medication Sig   cloNIDine (CATAPRES) 0.1 MG tablet Take 1 tablet by mouth twice daily (Patient taking differently: Take 0.1 mg by mouth 2 (two) times daily.)   clopidogrel (PLAVIX) 75 MG tablet Take 1 tablet (75 mg total) by mouth daily.   Cyanocobalamin (VITAMIN B 12 PO) Take 1,000 mcg by mouth daily.   Dulaglutide (TRULICITY) 1.5 YC/1.4GY SOPN Inject 1.5 mg into the skin once a week.   DULoxetine (CYMBALTA) 30 MG capsule Take 1 capsule (30 mg total) by mouth daily.   furosemide (LASIX) 40 MG tablet Take 1 tablet (40 mg total) by mouth 2 (two) times daily.   gabapentin (NEURONTIN) 300 MG capsule Take 1 capsule (300 mg total) by mouth 2 (two) times daily.   Insulin Glargine (BASAGLAR KWIKPEN) 100 UNIT/ML SOPN Inject 0.25 mLs (25 Units total) into the skin daily. (Patient taking differently: Inject 32 Units into the skin daily.)   insulin regular (HUMULIN R) 100 units/mL injection Inject 0.3 mLs (30 Units total) into the skin 2 (two) times daily before a meal. (Patient taking differently: Inject 32 Units into the skin 2 (two) times daily before a meal.)   metoprolol succinate (TOPROL-XL) 50 MG 24 hr tablet Take 1 tablet (50 mg total) by mouth daily. Take with or immediately following a meal.   nitroGLYCERIN (NITROSTAT) 0.4 MG SL tablet Place 1 tablet (0.4 mg total) under the tongue every 5 (five) minutes as needed for chest pain.   Omega-3 Fatty Acids (FISH OIL) 1000 MG CPDR Take 1 tablet by mouth daily.   polyethylene glycol powder (GLYCOLAX/MIRALAX) powder Take 17 g by mouth 2 (two) times daily as needed. (Patient taking differently: Take 17 g by mouth 2 (two) times daily as needed for mild constipation or moderate constipation.)   quinapril (ACCUPRIL) 40 MG tablet Take 1 tablet (40 mg total) by mouth at bedtime.   [DISCONTINUED] atorvastatin (LIPITOR) 80 MG tablet Take 1/2 (one-half) tablet  by mouth once daily (Patient taking differently: Take 40 mg by mouth daily. Take 1/2 (one-half) tablet by mouth once daily)   [DISCONTINUED] benzonatate (TESSALON PERLES) 100 MG capsule Take 1 capsule (100 mg total) by mouth 3 (three) times daily as needed for cough.     Allergies:   Patient has no known allergies.   Social History   Socioeconomic History   Marital status: Married    Spouse name: Alice   Number of children: 4   Years of education: 11   Highest education level: 11th grade  Occupational History   Occupation: Retired from [employer]    Employer: RETIRED  Tobacco Use   Smoking status: Never   Smokeless tobacco: Never  Vaping Use   Vaping Use: Never used  Substance and  Sexual Activity   Alcohol use: No    Alcohol/week: 0.0 standard drinks   Drug use: No   Sexual activity: Yes  Other Topics Concern   Not on file  Social History Narrative   Right handed, Married, 4 kids from previous marriage.  Retired.  Caffeine 1 cup daily.   Social Determinants of Health   Financial Resource Strain: Low Risk    Difficulty of Paying Living Expenses: Not hard at all  Food Insecurity: No Food Insecurity   Worried About Charity fundraiser in the Last Year: Never true   [place] in the Last Year: Never true  Transportation Needs: No Transportation Needs   Lack of Transportation (Medical): No   Lack of Transportation (Non-Medical): No  Physical Activity: Insufficiently Active   Days of Exercise per Week: 4 days   Minutes of Exercise per Session: 20 min  Stress: No Stress Concern Present   Feeling of Stress : Not at all  Social Connections: Socially Integrated   Frequency of Communication with Friends and Family: More than three times a week   Frequency of Social Gatherings with Friends and Family: More than three times a week   Attends Religious Services: More than 4 times per year   Active Member of [employer] or Organizations: Yes   Attends Arts administrator: More than 4 times per year   Marital Status: Married     Family History: The patient's family history includes Cancer in his brother; Diabetes in his brother, brother, brother, mother, sister, sister, sister, sister, and sister; Heart disease in his brother, father, and mother; Kidney disease in his sister; Throat cancer in his paternal uncle. ROS:   Please see the history of present illness.    All 14 point review of systems negative except as described per history of present illness  EKGs/Labs/Other Studies Reviewed:      Recent Labs: 07/02/2021: ALT 29; BUN 37; Creatinine, Ser 1.96; Hemoglobin 13.9; Platelets 211; Potassium 4.7; Sodium 137  Recent Lipid Panel    Component Value Date/Time   CHOL 152 07/02/2021 1334   TRIG 244 (H) 07/02/2021 1334   HDL 38 (L) 07/02/2021 1334   CHOLHDL 4.0 07/02/2021 1334   CHOLHDL 3.4 12/26/2018 0558   VLDL 36 12/26/2018 0558   LDLCALC 74 07/02/2021 1334    Physical Exam:    VS:  There were no vitals taken for this visit.    Wt Readings from Last 3 Encounters:  07/15/21 232 lb 12.8 oz (105.6 kg)  07/02/21 229 lb (103.9 kg)  05/15/21 228 lb (103.4 kg)     GEN:  Well nourished, well developed in no acute distress HEENT: Normal NECK: No JVD; No carotid bruits LYMPHATICS: No lymphadenopathy CARDIAC: RRR, no murmurs, no rubs, no gallops RESPIRATORY:  Clear to auscultation without rales, wheezing or rhonchi  ABDOMEN: Soft, non-tender, non-distended MUSCULOSKELETAL:  No edema; No deformity  SKIN: Warm and dry LOWER EXTREMITIES: no swelling NEUROLOGIC:  Alert and oriented x 3 PSYCHIATRIC:  Normal affect   ASSESSMENT:    1. Ischemic cardiomyopathy   2. Essential hypertension   3. Type 2 diabetes mellitus with other specified complication, without long-term current use of insulin ([place])   4. CKD (chronic kidney disease) stage 4, GFR 15-29 ml/min (HCC)    PLAN:    In order of problems listed above:  Ischemic cardiomyopathy  ejection fraction 45 to 50%.  This is what I would expect in somebody with left bundle  branch block.  He is on beta-blocker which I will continue.  He does have ACE inhibitor already on board which I will continue. Essential hypertension blood pressure controlled continue present management. Dyslipidemia I did review his K PN which show me LDL 74 HDL 38.  We will continue present management. Chronic kidney failure.  Last creatinine I see is from 07/02/2021 which is 1.96.  We will avoid nephrotoxic medication.  He is on ACE inhibitor which does have renal protective effect we will continue. ICD present.  Follow-up by our EP team.  I did review interrogation normal function doing well no episodes of V. tach. Diabetes mellitus better controlled but still not perfect.  His hemoglobin A1c was 7.9 he need to better control his diabetes since he already does have quite advanced neuropathy.   Medication Adjustments/Labs and Tests Ordered: Current medicines are reviewed at length with the patient today.  Concerns regarding medicines are outlined above.  No orders of the defined types were placed in this encounter.  Medication changes: No orders of the defined types were placed in this encounter.   Signed, Park Liter, MD, [facility] 07/28/2021 9:56 AM    [place]

## 2021-07-28 NOTE — Patient Instructions (Signed)

## 2021-07-30 ENCOUNTER — Other Ambulatory Visit: Payer: Self-pay

## 2021-07-30 ENCOUNTER — Encounter: Payer: Self-pay | Admitting: Podiatry

## 2021-07-30 ENCOUNTER — Ambulatory Visit: Payer: Medicare Other | Admitting: Podiatry

## 2021-07-30 DIAGNOSIS — M216X9 Other acquired deformities of unspecified foot: Secondary | ICD-10-CM

## 2021-07-30 DIAGNOSIS — M79674 Pain in right toe(s): Secondary | ICD-10-CM

## 2021-07-30 DIAGNOSIS — M79675 Pain in left toe(s): Secondary | ICD-10-CM | POA: Diagnosis not present

## 2021-07-30 DIAGNOSIS — E1142 Type 2 diabetes mellitus with diabetic polyneuropathy: Secondary | ICD-10-CM | POA: Diagnosis not present

## 2021-07-30 DIAGNOSIS — D689 Coagulation defect, unspecified: Secondary | ICD-10-CM

## 2021-07-30 DIAGNOSIS — B351 Tinea unguium: Secondary | ICD-10-CM

## 2021-07-30 DIAGNOSIS — L84 Corns and callosities: Secondary | ICD-10-CM | POA: Diagnosis not present

## 2021-07-30 NOTE — Progress Notes (Signed)
This patient returns to my office for at risk foot care.  This patient requires this care by a professional since this patient will be at risk due to having diabetic neuropathy claudication in PVD and CKD.  This patient is unable to cut nails himself since the patient cannot reach his nails.These nails are painful walking and wearing shoes.  Patient also has painful callus under the ball of his left foot.  He presents to the office for painful callus on the outside of left forefoot.  This patient presents for at risk foot care today.  General Appearance  Alert, conversant and in no acute stress.  Vascular  Dorsalis pedis and posterior tibial  pulses are weakly  palpable  bilaterally.  Capillary return is within normal limits  bilaterally  Cold feet  bilaterally.  Absent digital hair noted.  Neurologic  Senn-Weinstein monofilament wire test diminished  bilaterally. Muscle power within normal limits bilaterally.  Nails Thick disfigured discolored nails with subungual debris  from hallux to fifth toes bilaterally. No evidence of bacterial infection or drainage bilaterally.  Orthopedic  No limitations of motion  feet .  No crepitus or effusions noted.  No bony pathology or digital deformities noted.  Plantar flexed fifth metatarsal head left foot.    Skin  normotropic skin with no porokeratosis noted bilaterally.  No signs of infections or ulcers noted.     Onychomycosis  Pain in right toes  Pain in left toes  Porokeratosis sub 5th left foot.  Consent was obtained for treatment procedures.   Mechanical debridement of nails 1-5  bilaterally performed with a nail nipper.  Filed with dremel without incident. Debride callus with # 15 blade followed by dremel tool. Padding/cut out sub 5th met lef  He is to make an appointment for a second pair of diabetic shoes in 2023.t foot.   Return office visit  3 months                    Told patient to return for periodic foot care and evaluation due to potential at  risk complications.   Gardiner Barefoot DPM

## 2021-08-04 NOTE — Progress Notes (Signed)
Remote ICD transmission.   

## 2021-08-05 DIAGNOSIS — L570 Actinic keratosis: Secondary | ICD-10-CM | POA: Diagnosis not present

## 2021-08-05 DIAGNOSIS — D485 Neoplasm of uncertain behavior of skin: Secondary | ICD-10-CM | POA: Diagnosis not present

## 2021-08-05 DIAGNOSIS — L218 Other seborrheic dermatitis: Secondary | ICD-10-CM | POA: Diagnosis not present

## 2021-08-05 DIAGNOSIS — C44622 Squamous cell carcinoma of skin of right upper limb, including shoulder: Secondary | ICD-10-CM | POA: Diagnosis not present

## 2021-08-05 DIAGNOSIS — L280 Lichen simplex chronicus: Secondary | ICD-10-CM | POA: Diagnosis not present

## 2021-08-06 DIAGNOSIS — N1831 Chronic kidney disease, stage 3a: Secondary | ICD-10-CM | POA: Diagnosis not present

## 2021-08-13 DIAGNOSIS — C44622 Squamous cell carcinoma of skin of right upper limb, including shoulder: Secondary | ICD-10-CM | POA: Diagnosis not present

## 2021-08-27 ENCOUNTER — Other Ambulatory Visit: Payer: Self-pay | Admitting: *Deleted

## 2021-08-27 NOTE — Patient Instructions (Signed)
Visit Information  Thank you for taking time to visit with me today. Please don't hesitate to contact me if I can be of assistance to you before our next scheduled telephone appointment.  Following are the goals we discussed today:  Current Barriers:  Knowledge Deficits related to plan of care for management of DMII   RNCM Clinical Goal(s):  Patient will verbalize understanding of plan for management of DMII as evidenced by continuation of monitoring blood sugars and adhering to diabetic diet and low cholesterol diet through collaboration with RN Care manager, provider, and care team.   Interventions: Inter-disciplinary care team collaboration (see longitudinal plan of care) Evaluation of current treatment plan related to  self management and patient's adherence to plan as established by provider   Patient Goals/Self-Care Activities: Take medications as prescribed   Attend all scheduled provider appointments Call pharmacy for medication refills 3-7 days in advance of running out of medications Attend church or other social activities Perform all self care activities independently  Call provider office for new concerns or questions  schedule appointment with eye doctor check blood sugar at prescribed times: once daily check feet daily for cuts, sores or redness take the blood sugar meter to all doctor visits trim toenails straight across drink 6 to 8 glasses of water each day manage portion size Bake foods and cook with olive oil when possible    Our next appointment is by telephone on November 19, 2021  Please call the [facility] at [phone] if you need to cancel or reschedule your appointment.   Please call the Suicide and Crisis Lifeline: 988 if you are experiencing a Mental Health or [place] or need someone to talk to.  The patient verbalized understanding of instructions, educational materials, and care plan provided today and agreed to receive a  mailed copy of patient instructions, educational materials, and care plan.   Telephone follow up appointment with care management team member scheduled for: The patient has been provided with contact information for the care management team and has been advised to call with any health related questions or concerns.   [facility] [phone]

## 2021-08-27 NOTE — Patient Outreach (Signed)
[place] [facility]) Care Management [place] Note   08/27/2021 Name:  Manuel Kline MRN:  [id number] DOB:  [birth year]  Summary: Patient has not checked his blood sugar this morning. His A1C has decreased to 7.9 from 8.4.  Patient has not had any recent falls. Per patient her has received his diabetic shoes.  He still has some foot pain. Patient had concerns that he could not get a medical alert with GPS system. RN contacted [facility] medical alert and Patient was informed her could only get at home medical alert system and did not qualify for the GPS.   Recommendations/Changes made from today's visit: Patient to monitor blood sugars daily Medical alert with GPS will sent sent Tuesday RN discussed low cholesterol diet  Subjective: Manuel Kline is an 87 y.o. year old male who is a primary patient of Dettinger, Fransisca Kaufmann, MD. The care management team was consulted for assistance with care management and/or care coordination needs.    RN Health Coach completed Telephone Visit today.   Objective:  Medications Reviewed Today     Reviewed by Gardiner Barefoot, DPM (Physician) on 07/30/21 at 64  Med List Status: <None>   Medication Order Taking? Sig Documenting Provider Last Dose Status Informant  cloNIDine (CATAPRES) 0.1 MG tablet [id number] No Take 1 tablet by mouth twice daily  Patient taking differently: Take 0.1 mg by mouth 2 (two) times daily.   Dettinger, Fransisca Kaufmann, MD Taking Active   clopidogrel (PLAVIX) 75 MG tablet [id number] No Take 1 tablet (75 mg total) by mouth daily. Dettinger, Fransisca Kaufmann, MD Taking Active   Cyanocobalamin (VITAMIN B 12 PO) [id number] No Take 1,000 mcg by mouth daily. [provider] Taking Active Spouse/Significant Other  Dulaglutide (TRULICITY) 1.5 BO/1.7PZ SOPN [id number] No Inject 1.5 mg into the skin once a week. Dettinger, Fransisca Kaufmann, MD Taking Active   DULoxetine (CYMBALTA) 30 MG capsule [id number] No Take 1 capsule (30 mg total) by mouth daily.  Dettinger, Fransisca Kaufmann, MD Taking Active   furosemide (LASIX) 40 MG tablet [id number] No Take 1 tablet (40 mg total) by mouth 2 (two) times daily. Dettinger, Fransisca Kaufmann, MD Taking Active   gabapentin (NEURONTIN) 300 MG capsule [id number] No Take 1 capsule (300 mg total) by mouth 2 (two) times daily. Dettinger, Fransisca Kaufmann, MD Taking Active   Insulin Glargine [facility]) 100 UNIT/ML SOPN [id number] No Inject 0.25 mLs (25 Units total) into the skin daily.  Patient taking differently: Inject 32 Units into the skin daily.   Dettinger, Fransisca Kaufmann, MD Taking Active            Med Note Cheryll Cockayne, [name] Jun 27, 2019  2:20 PM) Medication increased to 28 units   insulin regular (HUMULIN R) 100 units/mL injection [id number] No Inject 0.3 mLs (30 Units total) into the skin 2 (two) times daily before a meal.  Patient taking differently: Inject 32 Units into the skin 2 (two) times daily before a meal.   Dettinger, Fransisca Kaufmann, MD Taking Active   metoprolol succinate (TOPROL-XL) 50 MG 24 hr tablet [id number] No Take 1 tablet (50 mg total) by mouth daily. Take with or immediately following a meal. Dettinger, Fransisca Kaufmann, MD Taking Active   nitroGLYCERIN (NITROSTAT) 0.4 MG SL tablet [id number] No Place 1 tablet (0.4 mg total) under the tongue every 5 (five) minutes as needed for chest pain. Dettinger, Fransisca Kaufmann, MD Taking Active   Omega-3 Fatty Acids (FISH OIL) 1000 MG CPDR  [id number] No Take 1 tablet by mouth daily. [provider] Taking Active Spouse/Significant Other  polyethylene glycol powder (GLYCOLAX/MIRALAX) powder [id number] No Take 17 g by mouth 2 (two) times daily as needed.  Patient taking differently: Take 17 g by mouth 2 (two) times daily as needed for mild constipation or moderate constipation.   Eustaquio Maize, MD Taking Active   quinapril (ACCUPRIL) 40 MG tablet [id number] No Take 1 tablet (40 mg total) by mouth at bedtime. Dettinger, Fransisca Kaufmann, MD Taking Active              SDOH:  (Social  Determinants of Health) assessments and interventions performed:  SDOH Interventions    Flowsheet Row Most Recent Value  SDOH Interventions   Food Insecurity Interventions Intervention Not Indicated  Housing Interventions Intervention Not Indicated  Transportation Interventions Intervention Not Indicated       Care Plan  Review of patient past medical history, allergies, medications, health status, including review of consultants reports, laboratory and other test data, was performed as part of comprehensive evaluation for care management services.   Care Plan : Diabetes Type 2 (Adult)  Updates made by Verlin Grills, RN since 08/27/2021 12:00 AM     Problem: Glycemic Management (Diabetes, Type 2) Resolved 08/27/2021  Priority: High  Onset Date: 07/17/2020  Note:   [id number] Resolving due to duplicate goal     Long-Range Goal: Glycemic Management Optimized Completed 08/27/2021  Start Date: 07/17/2020  Expected End Date: 08/29/2021  Recent Progress: On track  Priority: Medium  Note:   Evidence-based guidance:  Anticipate A1C testing (point-of-care) every 3 to 6 months based on goal attainment.  Review mutually-set A1C goal or target range.  Anticipate use of antihyperglycemic with or without insulin and periodic adjustments; consider active involvement of pharmacist.  Provide medical nutrition therapy and development of individualized eating.  Compare self-reported symptoms of hypo or hyperglycemia to blood glucose levels, diet and fluid intake, current medications, psychosocial and physiologic stressors, change in activity and barriers to care adherence.  Promote self-monitoring of blood glucose levels.  Assess and address barriers to management plan, such as food insecurity, age, developmental ability, depression, anxiety, fear of hypoglycemia or weight gain, as well as medication cost, side effects and complicated regimen.  Consider referral to community-based diabetes  education program, visiting nurse, community health worker or health coach.  Encourage regular dental care for treatment of periodontal disease; refer to dental provider when needed.   Notes:  [id number] Patient is monitoring diet and A1C    Task: Alleviate Barriers to Glycemic Management Completed 08/27/2021  Due Date: 08/29/2021  Note:   Care Management Activities:    - blood glucose monitoring encouraged - mutual A1C goal set or reviewed - resources required to improve adherence to care identified - self-awareness of signs/symptoms of hypo or hyperglycemia encouraged - use of blood glucose monitoring log promoted    Notes:     Problem: Disease Progression (Diabetes, Type 2) Resolved 08/27/2021  Priority: Medium  Onset Date: 07/17/2020  Note:   [id number] Resolving due to duplicate goal     Long-Range Goal: Disease Progression Prevented or Minimized Completed 08/27/2021  Start Date: 07/17/2020  Expected End Date: 08/29/2021  Recent Progress: On track  Priority: Medium  Note:   Evidence-based guidance:  Prepare patient for laboratory and diagnostic exams based on risk and presentation.  Encourage lifestyle changes, such as increased intake of plant-based foods, stress reduction, consistent physical activity and smoking cessation to prevent long-term  complications and chronic disease.   Individualize activity and exercise recommendations while considering potential limitations, such as neuropathy, retinopathy or the ability to prevent hyperglycemia or hypoglycemia.   Prepare patient for use of pharmacologic therapy that may include antihypertensive, analgesic, prostaglandin E1 with periodic adjustments, based on presenting chronic condition and laboratory results.  Assess signs/symptoms and risk factors for hypertension, sleep-disordered breathing, neuropathy (including changes in gait and balance), retinopathy, nephropathy and sexual dysfunction.  Address pregnancy planning and  contraceptive choice, especially when prescribing antihypertensive or statin.  Ensure completion of annual comprehensive foot exam and dilated eye exam.   Implement additional individualized goals and interventions based on identified risk factors.  Prepare patient for consultation or referral for specialist care, such as ophthalmology, neurology, cardiology, podiatry, nephrology or perinatology.   Notes:  [id number] Patient is following through with health maintenance. New diabetic shoes, Eye exam done.  Patient will follow up with getting flu vaccine and physical therapy    Task: Monitor and Manage Follow-up for Comorbidities Completed 08/27/2021  Due Date: 08/29/2021  Note:   Care Management Activities:    - completion of annual dilated eye exam confirmed - completion of annual foot exam verified - healthy lifestyle promoted - reduction of sedentary activity encouraged - signs/symptoms of comorbidities identified    Notes:  Eye exam [id number] Foot [id number] Received flu and COVID booster vaccine    Care Plan : [place] of Care  Updates made by Zackory Pudlo, Eppie Gibson, RN since 08/27/2021 12:00 AM     Problem: Knowledge Deficit Related to Diabetes and Care Coordination Needs   Priority: High     Long-Range Goal: Development Plan of Care for Management of Diabetes   Start Date: 08/27/2021  Expected End Date: 08/29/2022  Priority: High  Note:   Current Barriers:  Knowledge Deficits related to plan of care for management of DMII   RNCM Clinical Goal(s):  Patient will verbalize understanding of plan for management of DMII as evidenced by continuation of monitoring blood sugars and adhering to diabetic diet and low cholesterol diet  through collaboration with RN Care manager, provider, and care team.   Interventions: Inter-disciplinary care team collaboration (see longitudinal plan of care) Evaluation of current treatment plan related to  self management and patient's  adherence to plan as established by provider   Patient Goals/Self-Care Activities: Take medications as prescribed   Attend all scheduled provider appointments Call pharmacy for medication refills 3-7 days in advance of running out of medications Attend church or other social activities Perform all self care activities independently  Call provider office for new concerns or questions  schedule appointment with eye doctor check blood sugar at prescribed times: once daily check feet daily for cuts, sores or redness take the blood sugar meter to all doctor visits trim toenails straight across drink 6 to 8 glasses of water each day manage portion size Bake foods and cook with olive oil when possible        Plan: Telephone follow up appointment with care management team member scheduled for:  March The patient has been provided with contact information for the care management team and has been advised to call with any health related questions or concerns.   [facility] Care Management [phone]

## 2021-08-29 ENCOUNTER — Telehealth: Payer: Self-pay | Admitting: Pharmacist

## 2021-08-29 NOTE — Telephone Encounter (Signed)
Needs CCM appt for patient assistance re enrollment

## 2021-09-03 ENCOUNTER — Encounter: Payer: Self-pay | Admitting: Family Medicine

## 2021-09-03 ENCOUNTER — Ambulatory Visit (INDEPENDENT_AMBULATORY_CARE_PROVIDER_SITE_OTHER): Payer: Medicare Other | Admitting: Family Medicine

## 2021-09-03 ENCOUNTER — Telehealth: Payer: Self-pay

## 2021-09-03 VITALS — BP 141/64 | HR 69 | Ht 65.0 in | Wt 229.0 lb

## 2021-09-03 DIAGNOSIS — M545 Low back pain, unspecified: Secondary | ICD-10-CM

## 2021-09-03 MED ORDER — METHYLPREDNISOLONE ACETATE 40 MG/ML IJ SUSP
80.0000 mg | Freq: Once | INTRAMUSCULAR | Status: AC
  Administered 2021-09-03: 80 mg via INTRAMUSCULAR

## 2021-09-03 NOTE — Progress Notes (Signed)
BP (!) 141/64    Pulse 69    Ht 5\' 5"  (1.651 m)    Wt 229 lb (103.9 kg)    SpO2 98%    BMI 38.11 kg/m    Subjective:   Patient ID: Manuel Kline, male    DOB: Jan 16, 1932, 88 y.o.   MRN: [id number]  HPI: Manuel Kline is a 88 y.o. male presenting on 09/03/2021 for Back Pain   HPI Right lower back pain Patient is complaining of right lower back pain has been hurting some for 3 weeks.  It hurts some when he moves certain directions and then he will feel it Sometimes.  He does have a history of multiple spinal surgeries and fusions but he says it does not hurt in the midline anywhere.  It does not radiate down either leg.  He has not necessarily used anything at home.  He denies any numbness or weakness.  He just says SOMETIMES when he gets up or gets down.  He has not used anything over-the-counter cream wise either.  He has not done ice or heat.  Relevant past medical, surgical, family and social history reviewed and updated as indicated. Interim medical history since our last visit reviewed. Allergies and medications reviewed and updated.  Review of Systems  Constitutional:  Negative for chills and fever.  Respiratory:  Negative for shortness of breath and wheezing.   Cardiovascular:  Negative for chest pain and leg swelling.  Musculoskeletal:  Positive for arthralgias, back pain and myalgias. Negative for gait problem.  Skin:  Negative for rash.  Neurological:  Negative for weakness and numbness.  All other systems reviewed and are negative.  Per HPI unless specifically indicated above   Allergies as of 09/03/2021   No Known Allergies      Medication List        Accurate as of September 03, 2021 11:58 AM. If you have any questions, ask your nurse or doctor.          Basaglar KwikPen 100 UNIT/ML Inject 0.25 mLs (25 Units total) into the skin daily. What changed: how much to take   cloNIDine 0.1 MG tablet Commonly known as: CATAPRES Take 1 tablet by mouth twice daily    clopidogrel 75 MG tablet Commonly known as: PLAVIX Take 1 tablet (75 mg total) by mouth daily.   DULoxetine 30 MG capsule Commonly known as: CYMBALTA Take 1 capsule (30 mg total) by mouth daily.   Fish Oil 1000 MG Cpdr Take 1 tablet by mouth daily.   furosemide 40 MG tablet Commonly known as: LASIX Take 1 tablet (40 mg total) by mouth 2 (two) times daily.   gabapentin 300 MG capsule Commonly known as: NEURONTIN Take 1 capsule (300 mg total) by mouth 2 (two) times daily.   insulin regular 100 units/mL injection Commonly known as: HumuLIN R Inject 0.3 mLs (30 Units total) into the skin 2 (two) times daily before a meal. What changed: how much to take   metoprolol succinate 50 MG 24 hr tablet Commonly known as: TOPROL-XL Take 1 tablet (50 mg total) by mouth daily. Take with or immediately following a meal.   nitroGLYCERIN 0.4 MG SL tablet Commonly known as: NITROSTAT Place 1 tablet (0.4 mg total) under the tongue every 5 (five) minutes as needed for chest pain.   polyethylene glycol powder 17 GM/SCOOP powder Commonly known as: GLYCOLAX/MIRALAX Take 17 g by mouth 2 (two) times daily as needed. What changed: reasons to take this  quinapril 40 MG tablet Commonly known as: ACCUPRIL Take 1 tablet (40 mg total) by mouth at bedtime.   Trulicity 1.5 YF/7.4BS Sopn Generic drug: Dulaglutide Inject 1.5 mg into the skin once a week.   VITAMIN B 12 PO Take 1,000 mcg by mouth daily.         Objective:   BP (!) 141/64    Pulse 69    Ht 5\' 5"  (1.651 m)    Wt 229 lb (103.9 kg)    SpO2 98%    BMI 38.11 kg/m   Wt Readings from Last 3 Encounters:  09/03/21 229 lb (103.9 kg)  07/28/21 229 lb (103.9 kg)  07/15/21 232 lb 12.8 oz (105.6 kg)    Physical Exam Vitals and nursing note reviewed.  Constitutional:      Appearance: Normal appearance. He is obese.  Musculoskeletal:     Lumbar back: Tenderness present. No swelling, deformity or bony tenderness. Normal range of  motion. Negative right straight leg raise test and negative left straight leg raise test.       Back:  Neurological:     Mental Status: He is alert.      Assessment & Plan:   Problem List Items Addressed This Visit   None Visit Diagnoses     Acute right-sided low back pain without sciatica    -  Primary   Relevant Medications   methylPREDNISolone acetate (DEPO-MEDROL) injection 80 mg (Start on 09/03/2021 12:00 PM)       Follow-up in 1 month for his normal visit, can call back sooner if not improving. Follow up plan: Return if symptoms worsen or fail to improve.  Counseling provided for all of the vaccine components No orders of the defined types were placed in this encounter.   Caryl Pina, MD [facility] 09/03/2021, 11:58 AM

## 2021-09-03 NOTE — Chronic Care Management (AMB) (Signed)
Chronic Care Management   Note  09/03/2021 Name: Manuel Kline MRN: [id number] DOB: 1932-04-19  Manuel Kline is a 88 y.o. year old male who is a primary care patient of Dettinger, Fransisca Kaufmann, MD. I reached out to Manuel Kline by phone today in response to a referral sent by [name]'s PCP.  [name] was given information about Chronic Care Management services today including:  CCM service includes personalized support from designated clinical staff supervised by his physician, including individualized plan of care and coordination with other care providers 24/7 contact phone numbers for assistance for urgent and routine care needs. Service will only be billed when office clinical staff spend 20 minutes or more in a month to coordinate care. Only one practitioner may furnish and bill the service in a calendar month. The patient may stop CCM services at any time (effective at the end of the month) by phone call to the office staff. The patient is responsible for co-pay (up to 20% after annual deductible is met) if co-pay is required by the individual health plan.   Patient agreed to services and verbal consent obtained.   Follow up plan: Face to Face appointment with care management team member scheduled for: 10/01/2021  Noreene Larsson, [place], [place], [place] [address] Direct Dial: [phone] [email or web address] Website: [email or web address]

## 2021-09-03 NOTE — Telephone Encounter (Signed)
Pt has been scheduled for 10/01/2021

## 2021-09-30 ENCOUNTER — Telehealth: Payer: Self-pay | Admitting: *Deleted

## 2021-09-30 ENCOUNTER — Other Ambulatory Visit: Payer: Self-pay | Admitting: Family Medicine

## 2021-09-30 MED ORDER — BENAZEPRIL HCL 40 MG PO TABS: 40.0000 mg | ORAL_TABLET | Freq: Every day | ORAL | 3 refills | Status: DC

## 2021-09-30 NOTE — Progress Notes (Signed)
Please let patient know that I sent benazepril for him, it is once daily as well 40 mg.

## 2021-09-30 NOTE — Telephone Encounter (Signed)
Fax from [facility]: Quinapril 40 mg 1 QHS #90 Note from pharmacy: Product is on long term back order, pt request new Rx Last RF was 12/02/20 #90 with 3 RFs, Next OV 10/02/21 Please advise

## 2021-10-01 ENCOUNTER — Ambulatory Visit (INDEPENDENT_AMBULATORY_CARE_PROVIDER_SITE_OTHER): Payer: Medicare Other | Admitting: Pharmacist

## 2021-10-01 DIAGNOSIS — Z794 Long term (current) use of insulin: Secondary | ICD-10-CM

## 2021-10-01 DIAGNOSIS — E1169 Type 2 diabetes mellitus with other specified complication: Secondary | ICD-10-CM

## 2021-10-01 DIAGNOSIS — N184 Chronic kidney disease, stage 4 (severe): Secondary | ICD-10-CM

## 2021-10-01 MED ORDER — TRULICITY 3 MG/0.5ML ~~LOC~~ SOAJ: 3.0000 mg | SUBCUTANEOUS | 11 refills | Status: DC

## 2021-10-01 NOTE — Progress Notes (Signed)
Chronic Care Management Pharmacy Note  10/01/2021 Name:  Manuel Kline MRN:  [id number] DOB:  [birth year]  Summary: T2DM,CKD  Recommendations/Changes made from today's visit:  Diabetes: New goal. Uncontrolled-A1C 7.8%, GFR 37;  Current treatment: meds were initially chosen prior to pharmd appt due to cost/patient comfort; we are working to optimize them [place]  Denies personal and family history of Medullary thyroid cancer (MTC) Continue Basaglar 25 units once daily (patient has been taking twice) START FARXIGA 10MG DAILY FOR T2DM & CKD 3B (STABLE) WILL ENROLL IN AZ&ME PATIENT ASSISTANCE PROGRAM DECREASE Humulin R to 20 units twice daily with meals    Patient enrolled in PAP with [name] for TRULICITY, [place] R RXS ESCRIBED TO [facility] [place]; ships 21-monthsupplies to patient's home every quarter(due to back orders unlikely to ship more than 1 month at a time) Current glucose readings: fasting glucose: <200, post prandial glucose: 200s Denies hypoglycemic/hyperglycemic symptoms Discussed meal planning options and Plate method for healthy eating Avoid sugary drinks and desserts Incorporate balanced protein, non starchy veggies, 1 serving of carbohydrate with each meal Increase water intake Increase physical activity as able Current exercise: patient unable due to physical condition Recommended increase glp1; work to optimize insulin regimen and POs for T2DM Assessed patient finances. Re enrollment and dose changes sent to [employer] patient assistance program--will follow  Patient Goals/Self-Care Activities patient will:  - take medications as prescribed as evidenced by patient report and record review check glucose daily (3 times) or when symptomatic, document, and provide at future appointments collaborate with provider on medication access solutions engage in dietary modifications by FOLLOWING A HEART  HEALTHY DIET/HEALTHY PLATE METHOD  Plan: 1 MONTH  Subjective: Manuel DIMOCKis an 88y.o. year old male who is a primary patient of Dettinger, JFransisca Kaufmann MD.  The CCM team was consulted for assistance with disease management and care coordination needs.    Engaged with patient face to face for initial visit in response to provider referral for pharmacy case management and/or care coordination services.   Consent to Services:  The patient was given information about Chronic Care Management services, agreed to services, and gave verbal consent prior to initiation of services.  Please see initial visit note for detailed documentation.   Patient Care Team: Dettinger, JFransisca Kaufmann MD as PCP - General (Family Medicine) TEvans Lance MD as PCP - Electrophysiology (Cardiology) KPark Liter MD as PCP - Cardiology (Cardiology) GRegenia Skeeter MD as Consulting Physician (Internal Medicine) RPaulla DollyNTamala Fothergill DPM as Consulting Physician (Podiatry) TJacolyn Reedy MD as Consulting Physician (Cardiology) RZadie RhineGClent Demark MD as Consulting Physician (Ophthalmology) TEvans Lance MD as Consulting Physician (Cardiology) MGardiner Barefoot DPM as Consulting Physician (Podiatry) Pleasant, FEppie Gibson RN as TJohnsonvilleManagement PLavera Guise [facility](Pharmacist)  Objective:  Lab Results  Component Value Date   CREATININE 2.11 (H) 10/02/2021   CREATININE 1.96 (H) 07/02/2021   CREATININE 2.10 (H) 12/24/2020    Lab Results  Component Value Date   HGBA1C 7.9 (H) 10/02/2021   Last diabetic Eye exam:  Lab Results  Component Value Date/Time   HMDIABEYEEXA Retinopathy (A) 03/06/2021 12:00 AM    Last diabetic Foot exam: No results found for: HMDIABFOOTEX      Component Value Date/Time   CHOL 126 10/02/2021 0912   TRIG 169 (H) 10/02/2021 0912   HDL 40 10/02/2021 0912  CHOLHDL 3.2 10/02/2021 0912   CHOLHDL 3.4 12/26/2018 0558   VLDL 36 12/26/2018 0558   LDLCALC 58  10/02/2021 0912    Hepatic Function Latest Ref Rng & Units 10/02/2021 07/02/2021 12/24/2020  Total Protein 6.0 - 8.5 g/dL 6.9 6.9 6.8  Albumin 3.6 - 4.6 g/dL 4.6 4.4 4.1  AST 0 - 40 IU/L 26 30 24   ALT 0 - 44 IU/L 26 29 28   Alk Phosphatase 44 - 121 IU/L 122(H) 117 120  Total Bilirubin 0.0 - 1.2 mg/dL 0.6 0.6 0.5    Lab Results  Component Value Date/Time   TSH 1.421 12/25/2018 10:40 PM    CBC Latest Ref Rng & Units 10/02/2021 07/02/2021 12/24/2020  WBC 3.4 - 10.8 x10E3/uL 7.3 7.7 7.7  Hemoglobin 13.0 - 17.7 g/dL 13.9 13.9 13.4  Hematocrit 37.5 - 51.0 % 41.1 40.0 38.7  Platelets 150 - 450 x10E3/uL 182 211 178    No results found for: VD25OH  Clinical ASCVD: Yes  The ASCVD Risk score (Arnett DK, et al., 2019) failed to calculate for the following reasons:   The 2019 ASCVD risk score is only valid for ages 57 to 33   The patient has a prior MI or stroke diagnosis    Other: (CHADS2VASc if Afib, PHQ9 if depression, MMRC or CAT for COPD, ACT, DEXA)  Social History   Tobacco Use  Smoking Status Never  Smokeless Tobacco Never   BP Readings from Last 3 Encounters:  10/02/21 125/71  09/03/21 (!) 141/64  07/28/21 130/90   Pulse Readings from Last 3 Encounters:  10/02/21 80  09/03/21 69  07/28/21 64   Wt Readings from Last 3 Encounters:  10/02/21 230 lb (104.3 kg)  09/03/21 229 lb (103.9 kg)  07/28/21 229 lb (103.9 kg)    Assessment: Review of patient past medical history, allergies, medications, health status, including review of consultants reports, laboratory and other test data, was performed as part of comprehensive evaluation and provision of chronic care management services.   SDOH:  (Social Determinants of Health) assessments and interventions performed:    CCM Care Plan  No Known Allergies  Medications Reviewed Today     Reviewed by Lavera Guise, [facility] (Pharmacist) on 10/05/21 at 2322  Med List Status: <None>   Medication Order Taking? Sig Documenting Provider  Last Dose Status Informant  benazepril (LOTENSIN) 40 MG tablet [id number]  Take 1 tablet (40 mg total) by mouth daily. Dettinger, Fransisca Kaufmann, MD  Active   cloNIDine (CATAPRES) 0.1 MG tablet [id number] No Take 1 tablet by mouth twice daily  Patient taking differently: Take 0.1 mg by mouth 2 (two) times daily.   Dettinger, Fransisca Kaufmann, MD Taking Active   clopidogrel (PLAVIX) 75 MG tablet [id number] No Take 1 tablet (75 mg total) by mouth daily. Dettinger, Fransisca Kaufmann, MD Taking Active   Cyanocobalamin (VITAMIN B 12 PO) [id number] No Take 1,000 mcg by mouth daily. [provider] Taking Active Spouse/Significant Other  Dulaglutide (TRULICITY) 3 VA/7.0LI SOPN [id number] Yes Inject 3 mg as directed once a week. [name] patient assistance program Dettinger, Fransisca Kaufmann, MD  Active            Med Note Mickie Hillier Oct 05, 2021 11:21 PM) [place] patient assistance program    DULoxetine (CYMBALTA) 30 MG capsule [id number] No Take 1 capsule (30 mg total) by mouth daily. Dettinger, Fransisca Kaufmann, MD Taking Active   furosemide (LASIX) 40 MG tablet [id number] No Take  1 tablet (40 mg total) by mouth 2 (two) times daily. Dettinger, Fransisca Kaufmann, MD Taking Active   gabapentin (NEURONTIN) 300 MG capsule [id number] No Take 1 capsule (300 mg total) by mouth 2 (two) times daily. Dettinger, Fransisca Kaufmann, MD Taking Active   Insulin Glargine [facility]) 100 UNIT/ML SOPN [id number] No Inject 0.25 mLs (25 Units total) into the skin daily.  Patient taking differently: Inject 32 Units into the skin daily.   Dettinger, Fransisca Kaufmann, MD Taking Active            Med Note Blanca Friend, Omer Jack Oct 05, 2021 11:21 PM) [place] patient assistance program    insulin regular (HUMULIN R) 100 units/mL injection [id number] No Inject 0.3 mLs (30 Units total) into the skin 2 (two) times daily before a meal.  Patient taking differently: Inject 25 Units into the skin 2 (two) times daily before a meal.   Dettinger, Fransisca Kaufmann, MD  Taking Active            Med Note Blanca Friend, Omer Jack Oct 05, 2021 11:21 PM) [place] patient assistance program    metoprolol succinate (TOPROL-XL) 50 MG 24 hr tablet [id number] No Take 1 tablet (50 mg total) by mouth daily. Take with or immediately following a meal. Dettinger, Fransisca Kaufmann, MD Taking Active   nitroGLYCERIN (NITROSTAT) 0.4 MG SL tablet [id number] No Place 1 tablet (0.4 mg total) under the tongue every 5 (five) minutes as needed for chest pain. Dettinger, Fransisca Kaufmann, MD Taking Active   Omega-3 Fatty Acids (FISH OIL) 1000 MG CPDR [id number] No Take 1 tablet by mouth daily. [provider] Taking Active Spouse/Significant Other  polyethylene glycol powder (GLYCOLAX/MIRALAX) powder [id number] No Take 17 g by mouth 2 (two) times daily as needed.  Patient taking differently: Take 17 g by mouth 2 (two) times daily as needed for mild constipation or moderate constipation.   Eustaquio Maize, MD Taking Active   rosuvastatin (CRESTOR) 5 MG tablet [id number]  Take 1 tablet (5 mg total) by mouth daily. Dettinger, Fransisca Kaufmann, MD  Active             Patient Active Problem List   Diagnosis Date Noted   Glaucoma suspect with open angle 04/02/2021   Pseudophakia of both eyes 08/20/2020   Claudication in peripheral vascular disease ([place]) 07/23/2020   Sleep apnea    Osteoarthritis    Metabolic syndrome    Erosive esophagitis    Erectile dysfunction    Calcium oxalate renal stones    Moderate nonproliferative diabetic retinopathy of both eyes without macular edema associated with type 2 diabetes mellitus ([place]) 02/20/2020   Posterior vitreous detachment of both eyes 02/20/2020   History of TIA (transient ischemic attack) 01/19/2019   Cerebral embolism with cerebral infarction 12/26/2018   CKD (chronic kidney disease) stage 4, GFR 15-29 ml/min (HCC) 11/08/2017   Presence of automatic (implantable) cardiac defibrillator 11/08/2017   Ventricular tachycardia (paroxysmal) 07/14/2016    Essential hypertension 11/30/2015   Diabetic peripheral neuropathy ([place]) 01/31/2015   OSA on CPAP 11/05/2014   CAD (coronary artery disease)    Dual implantable cardioverter-defibrillator in situ    Hyperlipidemia    Type 2 diabetes mellitus with other specified complication ([place])    Morbid obesity ([place])    Ischemic cardiomyopathy    Chronic systolic heart failure (HCC)    S/P CABG (coronary artery bypass graft) 11/02/2000    Immunization History  Administered Date(s)  Administered   Fluad Quad(high Dose 65+) 06/26/2019, 05/22/2020, 06/16/2021   Influenza, High Dose Seasonal PF 06/14/2017, 07/19/2018   Influenza,inj,Quad PF,6+ Mos 06/12/2014, 06/24/2015, 06/17/2016   Moderna Sars-Covid-2 Vaccination 09/12/2019, 10/13/2019, 07/04/2020   Pneumococcal Conjugate-13 03/01/2015   Pneumococcal Polysaccharide-23 03/16/2016   Tdap 02/16/2020   Zoster Recombinat (Shingrix) 02/17/2020, 09/17/2020    Conditions to be addressed/monitored: DMII and CKD Stage 3B  Care Plan : PHARMD MEDICATION MANAGEMENT  Updates made by Lavera Guise, RPH since 10/05/2021 12:00 AM     Problem: CHL AMB "PATIENT-SPECIFIC PROBLEM"      Long-Range Goal: T2DM ,HLD PHARMD GOAL   This Visit's Progress: Not on track  Priority: High  Note:   Current Barriers:  Unable to independently afford treatment regimen Unable to maintain control of T2DM Suboptimal therapeutic regimen for T2DM  Pharmacist Clinical Goal(s):  patient will verbalize ability to afford treatment regimen achieve control of T2DM as evidenced by GOAL A1C<7% adhere to plan to optimize therapeutic regimen for T2DM as evidenced by report of adherence to recommended medication management changes through collaboration with PharmD and provider.   Interventions: 1:1 collaboration with Dettinger, Fransisca Kaufmann, MD regarding development and update of comprehensive plan of care as evidenced by provider attestation and co-signature Inter-disciplinary care  team collaboration (see longitudinal plan of care) Comprehensive medication review performed; medication list updated in electronic medical record  Diabetes: New goal. Uncontrolled-A1C 7.8%, GFR 37;  Current treatment: meds were initially chosen prior to pharmd appt due to cost/patient comfort; we are working to optimize them [place]  Denies personal and family history of Medullary thyroid cancer (MTC) Continue Basaglar 25 units once daily (patient has been taking twice) START FARXIGA 10MG DAILY WILL ENROLL IN AZ&ME PATIENT ASSISTANCE PROGRAM DECREASE Humulin R to 20 units twice daily with meals    Patient enrolled in PAP with [place] for TRULICITY, [place] TO [facility] [place]; ships 25-monthsupplies to patient's home every quarter(due to back orders unlikely to ship more than 1 month at a time) Current glucose readings: fasting glucose: <200, post prandial glucose: 200s Denies hypoglycemic/hyperglycemic symptoms Discussed meal planning options and Plate method for healthy eating Avoid sugary drinks and desserts Incorporate balanced protein, non starchy veggies, 1 serving of carbohydrate with each meal Increase water intake Increase physical activity as able Current exercise: patient unable due to physical condition Recommended increase glp1; work to optimize insulin regimen and POs for T2DM Assessed patient finances. Re enrollment and dose changes sent to [employer] patient assistance program--will follow  Patient Goals/Self-Care Activities patient will:  - take medications as prescribed as evidenced by patient report and record review check glucose daily (3 times) or when symptomatic, document, and provide at future appointments collaborate with provider on medication access solutions engage in dietary modifications by   FOLLOWING A HEART HEALTHY DIET/HEALTHY PLATE METHOD       Medication  Assistance: Application for [employer]illy [employer] & az&me  medication assistance program. in process.  Anticipated assistance start date tbd.  See plan of care for additional detail.  [facility]:  [facility]Phone: [phone]: [address] [address] [address]Phone: [phone]: [phone] Uses pill box? No - daughter sheila  helps as able Pt endorses 90% compliance  Follow Up:  Patient agrees to Care Plan and Follow-up.  Plan: Telephone follow up appointment with care management team member scheduled for:  1 month   Regina Eck, PharmD, BCPS Clinical Pharmacist, [place]  II Phone [phone]

## 2021-10-01 NOTE — Progress Notes (Signed)
Made wife aware. Pt has an OV with Dettinger 2/2

## 2021-10-02 ENCOUNTER — Ambulatory Visit (INDEPENDENT_AMBULATORY_CARE_PROVIDER_SITE_OTHER): Payer: Medicare Other | Admitting: Family Medicine

## 2021-10-02 ENCOUNTER — Encounter: Payer: Self-pay | Admitting: Family Medicine

## 2021-10-02 VITALS — BP 125/71 | HR 80 | Ht 65.0 in | Wt 230.0 lb

## 2021-10-02 DIAGNOSIS — Z794 Long term (current) use of insulin: Secondary | ICD-10-CM

## 2021-10-02 DIAGNOSIS — E1142 Type 2 diabetes mellitus with diabetic polyneuropathy: Secondary | ICD-10-CM

## 2021-10-02 DIAGNOSIS — I10 Essential (primary) hypertension: Secondary | ICD-10-CM | POA: Diagnosis not present

## 2021-10-02 DIAGNOSIS — E113393 Type 2 diabetes mellitus with moderate nonproliferative diabetic retinopathy without macular edema, bilateral: Secondary | ICD-10-CM | POA: Diagnosis not present

## 2021-10-02 DIAGNOSIS — N184 Chronic kidney disease, stage 4 (severe): Secondary | ICD-10-CM | POA: Diagnosis not present

## 2021-10-02 DIAGNOSIS — E1169 Type 2 diabetes mellitus with other specified complication: Secondary | ICD-10-CM | POA: Diagnosis not present

## 2021-10-02 DIAGNOSIS — E782 Mixed hyperlipidemia: Secondary | ICD-10-CM

## 2021-10-02 LAB — CBC WITH DIFFERENTIAL/PLATELET
Basophils Absolute: 0 10*3/uL (ref 0.0–0.2)
Basos: 0 %
EOS (ABSOLUTE): 0.2 10*3/uL (ref 0.0–0.4)
Eos: 3 %
Hematocrit: 41.1 % (ref 37.5–51.0)
Hemoglobin: 13.9 g/dL (ref 13.0–17.7)
Immature Grans (Abs): 0 10*3/uL (ref 0.0–0.1)
Immature Granulocytes: 0 %
Lymphocytes Absolute: 2.2 10*3/uL (ref 0.7–3.1)
Lymphs: 31 %
MCH: 30.6 pg (ref 26.6–33.0)
MCHC: 33.8 g/dL (ref 31.5–35.7)
MCV: 91 fL (ref 79–97)
Monocytes Absolute: 0.5 10*3/uL (ref 0.1–0.9)
Monocytes: 7 %
Neutrophils Absolute: 4.3 10*3/uL (ref 1.4–7.0)
Neutrophils: 59 %
Platelets: 182 10*3/uL (ref 150–450)
RBC: 4.54 x10E6/uL (ref 4.14–5.80)
RDW: 14 % (ref 11.6–15.4)
WBC: 7.3 10*3/uL (ref 3.4–10.8)

## 2021-10-02 LAB — CMP14+EGFR
ALT: 26 IU/L (ref 0–44)
AST: 26 IU/L (ref 0–40)
Albumin/Globulin Ratio: 2 (ref 1.2–2.2)
Albumin: 4.6 g/dL (ref 3.6–4.6)
Alkaline Phosphatase: 122 IU/L — ABNORMAL HIGH (ref 44–121)
BUN/Creatinine Ratio: 22 (ref 10–24)
BUN: 46 mg/dL — ABNORMAL HIGH (ref 8–27)
Bilirubin Total: 0.6 mg/dL (ref 0.0–1.2)
CO2: 27 mmol/L (ref 20–29)
Calcium: 9.5 mg/dL (ref 8.6–10.2)
Chloride: 98 mmol/L (ref 96–106)
Creatinine, Ser: 2.11 mg/dL — ABNORMAL HIGH (ref 0.76–1.27)
Globulin, Total: 2.3 g/dL (ref 1.5–4.5)
Glucose: 117 mg/dL — ABNORMAL HIGH (ref 70–99)
Potassium: 4.6 mmol/L (ref 3.5–5.2)
Sodium: 138 mmol/L (ref 134–144)
Total Protein: 6.9 g/dL (ref 6.0–8.5)
eGFR: 30 mL/min/{1.73_m2} — ABNORMAL LOW (ref >59)

## 2021-10-02 LAB — BAYER DCA HB A1C WAIVED: HB A1C (BAYER DCA - WAIVED): 7.9 % — ABNORMAL HIGH (ref 4.8–5.6)

## 2021-10-02 LAB — LIPID PANEL
Chol/HDL Ratio: 3.2 ratio (ref 0.0–5.0)
Cholesterol, Total: 126 mg/dL (ref 100–199)
HDL: 40 mg/dL (ref >39)
LDL Chol Calc (NIH): 58 mg/dL (ref 0–99)
Triglycerides: 169 mg/dL — ABNORMAL HIGH (ref 0–149)
VLDL Cholesterol Cal: 28 mg/dL (ref 5–40)

## 2021-10-02 MED ORDER — ROSUVASTATIN CALCIUM 5 MG PO TABS: 5.0000 mg | ORAL_TABLET | Freq: Every day | ORAL | 3 refills | Status: DC

## 2021-10-02 NOTE — Progress Notes (Signed)
BP 125/71    Pulse 80    Ht _0  (1.651 m)    Wt 230 lb (104.3 kg)    SpO2 97%    BMI 38.27 kg/m    Subjective:   Patient ID: Manuel Kline, male    DOB: [birth year], 88 y.o.   MRN: [id number]  HPI: Manuel Kline is a 88 y.o. male presenting on 10/02/2021 for Medical Management of Chronic Issues, Diabetes, and Hyperlipidemia   HPI Type 2 diabetes mellitus Patient comes in today for recheck of his diabetes. Patient has been currently taking Trulicity and Basaglar and Humalin R, patient is going to increase his Trulicity but he has not got the order yet.  He is going to go up to 3 mg on it.  His A1c today is 7.9 which is still about the same elevated than it was last time.. Patient benazepril currently on an ACE inhibitor/ARB. Patient has not seen an ophthalmologist this year. Patient denies any issues with their feet. The symptom started onset as an adult hypertension and hyperlipidemia ARE RELATED TO DM   Hypertension Patient is currently on benazepril, was on quinapril but it is on recall.  And clonidine and furosemide and metoprolol, and their blood pressure today is 125/71. Patient denies any lightheadedness or dizziness. Patient denies headaches, blurred vision, chest pains, shortness of breath, or weakness. Denies any side effects from medication and is content with current medication.   Hyperlipidemia Patient is coming in for recheck of his hyperlipidemia. The patient is currently taking fish oil. They deny any issues with myalgias or history of liver damage from it. They deny any focal numbness or weakness or chest pain.   Relevant past medical, surgical, family and social history reviewed and updated as indicated. Interim medical history since our last visit reviewed. Allergies and medications reviewed and updated.  Review of Systems  Constitutional:  Negative for chills and fever.  Eyes:  Negative for visual disturbance.  Respiratory:  Negative for shortness of breath and wheezing.    Cardiovascular:  Negative for chest pain and leg swelling.  Musculoskeletal:  Negative for back pain and gait problem.  Skin:  Negative for rash.  Neurological:  Negative for dizziness, weakness and light-headedness.  All other systems reviewed and are negative.  Per HPI unless specifically indicated above   Allergies as of 10/02/2021   No Known Allergies      Medication List        Accurate as of October 02, 2021  9:52 AM. If you have any questions, ask your nurse or doctor.          Basaglar KwikPen 100 UNIT/ML Inject 0.25 mLs (25 Units total) into the skin daily. What changed: how much to take   benazepril 40 MG tablet Commonly known as: LOTENSIN Take 1 tablet (40 mg total) by mouth daily.   cloNIDine 0.1 MG tablet Commonly known as: CATAPRES Take 1 tablet by mouth twice daily   clopidogrel 75 MG tablet Commonly known as: PLAVIX Take 1 tablet (75 mg total) by mouth daily.   DULoxetine 30 MG capsule Commonly known as: CYMBALTA Take 1 capsule (30 mg total) by mouth daily.   Fish Oil 1000 MG Cpdr Take 1 tablet by mouth daily.   furosemide 40 MG tablet Commonly known as: LASIX Take 1 tablet (40 mg total) by mouth 2 (two) times daily.   gabapentin 300 MG capsule Commonly known as: NEURONTIN Take 1 capsule (300 mg total) by mouth  2 (two) times daily.   insulin regular 100 units/mL injection Commonly known as: HumuLIN R Inject 0.3 mLs (30 Units total) into the skin 2 (two) times daily before a meal. What changed: how much to take   metoprolol succinate 50 MG 24 hr tablet Commonly known as: TOPROL-XL Take 1 tablet (50 mg total) by mouth daily. Take with or immediately following a meal.   nitroGLYCERIN 0.4 MG SL tablet Commonly known as: NITROSTAT Place 1 tablet (0.4 mg total) under the tongue every 5 (five) minutes as needed for chest pain.   polyethylene glycol powder 17 GM/SCOOP powder Commonly known as: GLYCOLAX/MIRALAX Take 17 g by mouth 2 (two)  times daily as needed. What changed: reasons to take this   rosuvastatin 5 MG tablet Commonly known as: Crestor Take 1 tablet (5 mg total) by mouth daily. Started by: Worthy Rancher, MD   Trulicity 3 XB/1.4NW Sopn Generic drug: Dulaglutide Inject 3 mg as directed once a week. [name] cares patient assistance program   VITAMIN B 12 PO Take 1,000 mcg by mouth daily.         Objective:   BP 125/71    Pulse 80    Ht _0  (1.651 m)    Wt 230 lb (104.3 kg)    SpO2 97%    BMI 38.27 kg/m   Wt Readings from Last 3 Encounters:  10/02/21 230 lb (104.3 kg)  09/03/21 229 lb (103.9 kg)  07/28/21 229 lb (103.9 kg)    Physical Exam Vitals and nursing note reviewed.  Constitutional:      General: He is not in acute distress.    Appearance: He is well-developed. He is not diaphoretic.  Eyes:     General: No scleral icterus.    Conjunctiva/sclera: Conjunctivae normal.  Neck:     Thyroid: No thyromegaly.  Cardiovascular:     Rate and Rhythm: Normal rate and regular rhythm.     Heart sounds: Normal heart sounds. No murmur heard. Pulmonary:     Effort: Pulmonary effort is normal. No respiratory distress.     Breath sounds: Normal breath sounds. No wheezing.  Musculoskeletal:        General: No swelling. Normal range of motion.     Cervical back: Neck supple.  Lymphadenopathy:     Cervical: No cervical adenopathy.  Skin:    General: Skin is warm and dry.     Findings: No rash.  Neurological:     Mental Status: He is alert and oriented to person, place, and time.     Coordination: Coordination normal.  Psychiatric:        Behavior: Behavior normal.      Assessment & Plan:   Problem List Items Addressed This Visit       Cardiovascular and Mediastinum   Essential hypertension   Relevant Medications   rosuvastatin (CRESTOR) 5 MG tablet     Endocrine   Type 2 diabetes mellitus with other specified complication (HCC) - Primary   Relevant Medications   rosuvastatin  (CRESTOR) 5 MG tablet   Other Relevant Orders   CBC with Differential/Platelet   CMP14+EGFR   Lipid panel   Bayer DCA Hb A1c Waived   Diabetic peripheral neuropathy (HCC)   Relevant Medications   rosuvastatin (CRESTOR) 5 MG tablet   Moderate nonproliferative diabetic retinopathy of both eyes without macular edema associated with type 2 diabetes mellitus (HCC)   Relevant Medications   rosuvastatin (CRESTOR) 5 MG tablet  Genitourinary   CKD (chronic kidney disease) stage 4, GFR 15-29 ml/min (HCC)   Relevant Orders   CBC with Differential/Platelet   CMP14+EGFR   Lipid panel   Bayer DCA Hb A1c Waived     Other   Hyperlipidemia (Chronic)   Relevant Medications   rosuvastatin (CRESTOR) 5 MG tablet    A1c is elevated, already has ordered for Trulicity 3 mg to go up from the 1.5 but just has not gotten it yet, will start it once he gets it.  Blood pressure looks good, no other changes at this point, will check blood work Follow up plan: Return in about 3 months (around 12/30/2021), or if symptoms worsen or fail to improve, for Diabetes recheck.  Counseling provided for all of the vaccine components Orders Placed This Encounter  Procedures   CBC with Differential/Platelet   CMP14+EGFR   Lipid panel   Bayer DCA Hb A1c Waived    Caryl Pina, MD [facility] 10/02/2021, 9:52 AM

## 2021-10-04 ENCOUNTER — Encounter: Payer: Self-pay | Admitting: Family Medicine

## 2021-10-05 MED ORDER — DAPAGLIFLOZIN PROPANEDIOL 10 MG PO TABS: 10.0000 mg | ORAL_TABLET | Freq: Every day | ORAL | 5 refills | Status: DC

## 2021-10-05 NOTE — Patient Instructions (Signed)
Visit Information  Following are the goals we discussed today:  Recommendations/Changes made from today's visit:  Diabetes: New goal. Uncontrolled-A1C 7.8%, GFR 37;  Current treatment: meds were initially chosen prior to pharmd appt due to cost/patient comfort; we are working to optimize them INCREASE TRULICITY TO 3MG  SQ WEEKLY  Denies personal and family history of Medullary thyroid cancer (MTC) Continue Basaglar 25 units once daily (patient has been taking twice) START FARXIGA 10MG  DAILY FOR T2DM & CKD 3B (STABLE) WILL ENROLL IN [place] PATIENT ASSISTANCE PROGRAM DECREASE Humulin R to 20 units twice daily with meals    Patient enrolled in PAP with [name] for TRULICITY, [place] R RXS ESCRIBED [facility] [place]; ships 74-month supplies to patient's home every quarter(due to back orders unlikely to ship more than 1 month at a time) Current glucose readings: fasting glucose: <200, post prandial glucose: 200s Denies hypoglycemic/hyperglycemic symptoms Discussed meal planning options and Plate method for healthy eating Avoid sugary drinks and desserts Incorporate balanced protein, non starchy veggies, 1 serving of carbohydrate with each meal Increase water intake Increase physical activity as able Current exercise: patient unable due to physical condition Recommended increase glp1; work to optimize insulin regimen and POs for T2DM Assessed patient finances. Re enrollment and dose changes sent to lilly cares patient assistance program--will follow  Patient Goals/Self-Care Activities patient will:  - take medications as prescribed as evidenced by patient report and record review check glucose daily (3 times) or when symptomatic, document, and provide at future appointments collaborate with provider on medication access solutions engage in dietary modifications by FOLLOWING A HEART HEALTHY DIET/HEALTHY PLATE METHOD  Plan: 1 MONTH  Plan: Telephone  follow up appointment with care management team member scheduled for:  1 month  Regina Eck, PharmD, BCPS Clinical Pharmacist, [place]  II Phone [phone]   Please call the care guide team at [phone] if you need to cancel or reschedule your appointment.   Print copy of patient instructions, educational materials, and care plan provided in person.

## 2021-10-21 ENCOUNTER — Ambulatory Visit: Payer: Medicare Other | Admitting: Neurology

## 2021-10-21 ENCOUNTER — Encounter: Payer: Self-pay | Admitting: Neurology

## 2021-10-21 ENCOUNTER — Ambulatory Visit: Payer: Medicare Other | Admitting: Pharmacist

## 2021-10-21 VITALS — BP 153/75 | HR 86 | Ht 65.0 in | Wt 232.5 lb

## 2021-10-21 DIAGNOSIS — G4761 Periodic limb movement disorder: Secondary | ICD-10-CM

## 2021-10-21 DIAGNOSIS — I739 Peripheral vascular disease, unspecified: Secondary | ICD-10-CM

## 2021-10-21 DIAGNOSIS — I5022 Chronic systolic (congestive) heart failure: Secondary | ICD-10-CM | POA: Diagnosis not present

## 2021-10-21 DIAGNOSIS — G4733 Obstructive sleep apnea (adult) (pediatric): Secondary | ICD-10-CM

## 2021-10-21 DIAGNOSIS — I255 Ischemic cardiomyopathy: Secondary | ICD-10-CM

## 2021-10-21 DIAGNOSIS — I6340 Cerebral infarction due to embolism of unspecified cerebral artery: Secondary | ICD-10-CM

## 2021-10-21 DIAGNOSIS — Z95810 Presence of automatic (implantable) cardiac defibrillator: Secondary | ICD-10-CM

## 2021-10-21 DIAGNOSIS — Z9989 Dependence on other enabling machines and devices: Secondary | ICD-10-CM

## 2021-10-21 NOTE — Progress Notes (Signed)
SLEEP MEDICINE CLINIC    Provider:  Larey Seat, MD  Primary Care Physician:  Dettinger, Fransisca Kaufmann, MD [place] [address]     Referring Provider: Dettinger, Fransisca Kaufmann, [place],  [name] [address]          Chief Complaint according to patient   Patient presents with:     New Patient (Initial Visit)           HISTORY OF PRESENT ILLNESS:  Manuel Kline is a 88 y.o. year old White or Caucasian male patient seen here as a referral on 10/21/2021 from Dr Warrick Parisian, MD.   Chief concern according to patient :  Rm 10, w daughter Manuel Kline. Internal referral for OSA. Last SS done in 2015. Pt is severely hearing impaired, sleeps more than usual, has neuropathy-pacemaker, CKD , here to re visit since last visit with Dr. Leonie Man in 2020 for TIA and a small stroke, incoherent, facial droop.   Pt has been using his CPAP nightly but seems to not be working properly. Pt here to get a new CPAP is possible  DME [facility].     Manuel Kline  has a past medical history of Adenomatous colon polyp (2006), CAD (coronary artery disease), Calcium oxalate renal stones, Cardiomyopathy, Cataract, Cataract, Cerebral embolism with cerebral infarction (3/79/0240), Chronic systolic heart failure ([place]), CKD (chronic kidney disease) stage 4, GFR 15-29 ml/min (HCC) (11/08/2017), Claudication in peripheral vascular disease ([place]) (07/23/2020), Diabetes ([place]), Diabetic peripheral neuropathy ([place]) (01/31/2015), Dual implantable cardioverter-defibrillator in situ, Erectile dysfunction, Erosive esophagitis, Essential hypertension (11/30/2015), Hemorrhoids, History of TIA (transient ischemic attack) (01/19/2019), HLD (hyperlipidemia), HTN (hypertension), Hyperlipidemia, Hypertensive heart disease without CHF (07/31/2011), ICD (implantable cardiac defibrillator) in place, ICD dual chamber in situ, Ischemic cardiomyopathy, Left-sided weakness (9/73/5329), Metabolic syndrome, Moderate nonproliferative diabetic retinopathy  of both eyes without macular edema associated with type 2 diabetes mellitus ([place]) (02/20/2020), Morbid obesity ([place]), OSA on CPAP (11/05/2014), Osteoarthritis, Posterior vitreous detachment of both eyes (02/20/2020), Presence of automatic (implantable) cardiac defibrillator (11/08/2017), S/P CABG (coronary artery bypass graft) (11/02/2000), Sleep apnea, Stroke-like symptoms (12/26/2018), Syncope (08/06/2009), Type 2 diabetes, uncontrolled, with neuropathy, and Ventricular tachycardia (paroxysmal) (07/14/2016).   The patient had the first sleep study in the year 2007.  Note from 07-2014: [name] was last seen 7-1/2 years ago in my practice, at that time a patient of Dr Edrick Oh.  At the time he carried the diagnosis of a complicated or so-called complex sleep apnea syndrome. The patient was placed on a VPAP because of his cardiac history including cardiomyopathy, coronary artery disease, congestive heart failure and he has a defibrillator implanted. His residual apnea index was 7.5 his sleep study in 2007 diagnosed him with an AHI of 77 apneas per hour of sleep. The patient had meanwhile felt that the machine did not longer benefit him or that it actually multifunctioned. He has not been using it since 2012, but never contacted [place] or the DME. He is here today to see if he actually still needs apnea treatment and if his condition is still present.  He is here today to find out  what the best treatment would be should be still find apnea to be present.   Sleep relevant medical history: Manuel Kline has used his most recent CPAP continuously.  He is now "reading it up" and he needs desperately either a new machine or at least new supplies.  I understand that his machine  was prescribed in 2015 so it is by now 39-1/86 years old and it would need to be replaced in any case.  Our possibilities of testing however are quite different from the time when we first met.  We can do a home sleep test to simply confirm the presence  of sleep apnea and then based on that order a CPAP device for him.  If he has complex sleep apnea so-called central sleep apnea we would still prefer for the patient to come to the sleep lab to be titrated as some patients get worse on the CPAP.     Family medical /sleep history:  daughter and son on CPAP with OSA, had 9 siblings, 2 brothers had OSA.    Social history:  Patient is retired from  plumbing, and lives in a household with spouse, and a daughter lives close by, ex son in law is helping.  Meal on wheels, daughter sets pill boxes.   Family status is married , with adult children, and grandchildren.  Active churchgoer,  Tobacco use never .  ETOH use none, Caffeine intake in form of Coffee( 1 in am ) Hobbies :[employer]      Sleep habits are as follows: The patient's dinner time is between 5-6 PM. He snacks later- high carb diet- The patient goes to bed at 11 PM and continues to sleep for 6-7 hours, he kicks and moves all the  time- wakes for 1-2 bathroom breaks.  He reports foot pain and jerking in daytime, and while resting in his recliner.  The preferred sleep position is supine and on his side , with the support of 2 pillows. Head of bed elevated. Dreams are reportedly frequent.  7  AM is the usual rise time. The patient wakes up spontaneously/- he has a "breakfast cook" coming to his home.  He reports not feeling refreshed or restored in AM, with symptoms such as dry mouth, leg pain. Naps are taken frequently, lasting from 10 to 30 minutes and are more refreshing than nocturnal sleep.    Review of Systems: Out of a complete 14 system review, the patient complains of only the following symptoms, and all other reviewed systems are negative.:  Fatigue, sleepiness , snoring, PLMs, pain interrupting sleep- legs and feet.  CKD .    How likely are you to doze in the following situations: 0 = not likely, 1 = slight chance, 2 = moderate chance, 3 = high chance   Sitting and  Reading? Watching Television? Sitting inactive in a public place (theater or meeting)? As a passenger in a car for an hour without a break? Lying down in the afternoon when circumstances permit? Sitting and talking to someone? Sitting quietly after lunch without alcohol? In a car, while stopped for a few minutes in traffic?   Total = 16/ 24 points   FSS endorsed at 18/ 63 points.   Social History   Socioeconomic History   Marital status: Married    Spouse name: Manuel Kline   Number of children: 4   Years of education: 11   Highest education level: 11th grade  Occupational History   Occupation: Retired from [employer]: RETIRED  Tobacco Use   Smoking status: Never   Smokeless tobacco: Never  Vaping Use   Vaping Use: Never used  Substance and Sexual Activity   Alcohol use: No    Alcohol/week: 0.0 standard drinks   Drug use: No   Sexual activity: Yes  Other Topics Concern   Not on file  Social History Narrative   Lives at home with wife   Right handed, Married, 4 kids from previous marriage.  Retired.     Caffeine 1 cup daily.   Social Determinants of Health   Financial Resource Strain: Low Risk    Difficulty of Paying Living Expenses: Not hard at all  Food Insecurity: No Food Insecurity   Worried About Charity fundraiser in the Last Year: Never true   [place] in the Last Year: Never true  Transportation Needs: No Transportation Needs   Lack of Transportation (Medical): No   Lack of Transportation (Non-Medical): No  Physical Activity: Insufficiently Active   Days of Exercise per Week: 4 days   Minutes of Exercise per Session: 20 min  Stress: No Stress Concern Present   Feeling of Stress : Not at all  Social Connections: Socially Integrated   Frequency of Communication with Friends and Family: More than three times a week   Frequency of Social Gatherings with Friends and Family: More than three times a week   Attends Religious Services: More  than 4 times per year   Active Member of Clubs or Organizations: Yes   Attends Music therapist: More than 4 times per year   Marital Status: Married    Family History  Problem Relation Age of Onset   Diabetes Brother    Heart disease Father    Heart disease Mother    Diabetes Mother    Diabetes Brother    Diabetes Brother    Cancer Brother        baldder   Heart disease Brother    Diabetes Sister    Diabetes Sister    Kidney disease Sister        dialysis   Diabetes Sister    Diabetes Sister    Diabetes Sister    Throat cancer Paternal Uncle     Past Medical History:  Diagnosis Date   Adenomatous colon polyp 2006   CAD (coronary artery disease)    Calcium oxalate renal stones    Cardiomyopathy    Cataract    Cataract    Cerebral embolism with cerebral infarction 10/29/6008   Chronic systolic heart failure ([place])         CKD (chronic kidney disease) stage 4, GFR 15-29 ml/min ([place]) 11/08/2017   Claudication in peripheral vascular disease ([place]) 07/23/2020   Diabetes ([place])    Diabetic peripheral neuropathy ([place]) 01/31/2015   Dual implantable cardioverter-defibrillator in situ    01/16/2002 Dr. Lovena Le  RA lead  Guidant [id number] RV lead  Guidant [id number] Generator  Guidant Prism  09/02/2009 Generator change Medtronic [id number]  SN  [id number]      Erectile dysfunction    Erosive esophagitis    Essential hypertension 11/30/2015   Hemorrhoids    History of TIA (transient ischemic attack) 01/19/2019   HLD (hyperlipidemia)    HTN (hypertension)    Hyperlipidemia    Hypertensive heart disease without CHF 07/31/2011   ICD (implantable cardiac defibrillator) in place    ICD dual chamber in situ    Ischemic cardiomyopathy    EF 40% June 2013    Left-sided weakness 3/76/2831   Metabolic syndrome    Moderate nonproliferative diabetic retinopathy of both eyes without macular edema associated with type 2 diabetes mellitus ([place]) 02/20/2020   Morbid obesity ([place])    OSA  on CPAP 11/05/2014   Osteoarthritis  Posterior vitreous detachment of both eyes 02/20/2020   Presence of automatic (implantable) cardiac defibrillator 11/08/2017   S/P CABG (coronary artery bypass graft) 11/02/2000   Sleep apnea    Stroke-like symptoms 12/26/2018   Syncope 08/06/2009   Qualifier: Diagnosis of  By: Selena Batten CMA, [place]     Type 2 diabetes, uncontrolled, with neuropathy    Has retinopahty and neuropathy     Ventricular tachycardia (paroxysmal) 07/14/2016    Past Surgical History:  Procedure Laterality Date   ABDOMINAL EXPLORATION SURGERY     BACK SURGERY     X'3   cardiac bypass     CARDIAC DEFIBRILLATOR PLACEMENT     CARPAL TUNNEL RELEASE     X2, bilateral   CATARACT EXTRACTION     COLONOSCOPY  06/20/2012   Procedure: COLONOSCOPY;  Surgeon: Sable Feil, MD;  Location: [facility];  Service: Endoscopy;  Laterality: N/A;   DOPPLER ECHOCARDIOGRAPHY  2003   EP IMPLANTABLE DEVICE N/A 07/14/2016   Procedure: ICD Generator Changeout;  Surgeon: Evans Lance, MD;  Location: [facility];  Service: Cardiovascular;  Laterality: N/A;   ESOPHAGOGASTRODUODENOSCOPY  06/20/2012   Procedure: ESOPHAGOGASTRODUODENOSCOPY (EGD);  Surgeon: Sable Feil, MD;  Location: [facility];  Service: Endoscopy;  Laterality: N/A;   EYE SURGERY     LAPAROTOMY     RETINOPATHY SURGERY Bilateral    rotator cuff surgery     left     Current Outpatient Medications on File Prior to Visit  Medication Sig Dispense Refill   benazepril (LOTENSIN) 40 MG tablet Take 1 tablet (40 mg total) by mouth daily. 90 tablet 3   cloNIDine (CATAPRES) 0.1 MG tablet Take 1 tablet by mouth twice daily (Patient taking differently: Take 0.1 mg by mouth 2 (two) times daily.) 180 tablet 0   clopidogrel (PLAVIX) 75 MG tablet Take 1 tablet (75 mg total) by mouth daily. 90 tablet 3   Cyanocobalamin (VITAMIN B 12 PO) Take 1,000 mcg by mouth daily.     dapagliflozin propanediol (FARXIGA) 10 MG TABS tablet Take 1  tablet (10 mg total) by mouth daily before breakfast. 90 tablet 5   Dulaglutide (TRULICITY) 3 JS/2.8BT SOPN Inject 3 mg as directed once a week. [name] [facility] patient assistance program 6 mL 11   DULoxetine (CYMBALTA) 30 MG capsule Take 1 capsule (30 mg total) by mouth daily. 90 capsule 3   furosemide (LASIX) 40 MG tablet Take 1 tablet (40 mg total) by mouth 2 (two) times daily. 180 tablet 3   gabapentin (NEURONTIN) 300 MG capsule Take 1 capsule (300 mg total) by mouth 2 (two) times daily. 180 capsule 3   Insulin Glargine (BASAGLAR KWIKPEN) 100 UNIT/ML SOPN Inject 0.25 mLs (25 Units total) into the skin daily. (Patient taking differently: Inject 32 Units into the skin daily.)     insulin regular (HUMULIN R) 100 units/mL injection Inject 0.3 mLs (30 Units total) into the skin 2 (two) times daily before a meal. (Patient taking differently: Inject 25 Units into the skin 2 (two) times daily before a meal.) 60 mL 3   metoprolol succinate (TOPROL-XL) 50 MG 24 hr tablet Take 1 tablet (50 mg total) by mouth daily. Take with or immediately following a meal. 90 tablet 3   nitroGLYCERIN (NITROSTAT) 0.4 MG SL tablet Place 1 tablet (0.4 mg total) under the tongue every 5 (five) minutes as needed for chest pain. 25 tablet 0   Omega-3 Fatty Acids (FISH OIL) 1000 MG CPDR Take 1 tablet  by mouth daily.     polyethylene glycol powder (GLYCOLAX/MIRALAX) powder Take 17 g by mouth 2 (two) times daily as needed. (Patient taking differently: Take 17 g by mouth 2 (two) times daily as needed for mild constipation or moderate constipation.) 3350 g 1   rosuvastatin (CRESTOR) 5 MG tablet Take 1 tablet (5 mg total) by mouth daily. 90 tablet 3   No current facility-administered medications on file prior to visit.    No Known Allergies  Physical exam:  Today's Vitals   10/21/21 1248  BP: (!) 153/75  Pulse: 86  Weight: 232 lb 8 oz (105.5 kg)  Height: 5' 5"  (1.651 m)   Body mass index is 38.69 kg/m.   Wt Readings from  Last 3 Encounters:  10/21/21 232 lb 8 oz (105.5 kg)  10/02/21 230 lb (104.3 kg)  09/03/21 229 lb (103.9 kg)     Ht Readings from Last 3 Encounters:  10/21/21 5' 5"  (1.651 m)  10/02/21 5' 5"  (1.651 m)  09/03/21 5' 5"  (1.651 m)      General: The patient is awake, alert and appears not in acute distress. The patient is well groomed. Head: Normocephalic, atraumatic. Neck is supple.  Mallampati 3 plus,,  neck circumference:16.5 inches . Nasal airflow  patent.  Retrognathia is not seen.  Dental status: no teeth, no dentures.  Cardiovascular:  Regular rate and cardiac rhythm by pulse,  without distended neck veins. Respiratory: Lungs are clear to auscultation.  Skin:  Without evidence of ankle edema, or rash. Trunk: The patient's posture is erect.   Neurologic exam : The patient is awake and alert, oriented to place and time.   Memory subjective described as intact.  Attention span & concentration ability appears normal.  Speech is fluent,  without  dysarthria, dysphonia or aphasia.  Mood and affect are appropriate.   Cranial nerves: no loss of smell or taste reported  Pupils are equal and briskly reactive to light. Funduscopic exam deferred. .  Extraocular movements in vertical and horizontal planes were intact and without nystagmus. No Diplopia. Visual fields by finger perimetry are intact. Hearing severely impaired.    Facial sensation intact to fine touch.  Facial motor strength is symmetric and tongue and uvula move midline.  Neck ROM : rotation, tilt and flexion extension were normal for age and shoulder shrug was symmetrical.    Motor exam:  Symmetric bulk, tone and ROM.  he has no feeling in his legs, walks with elevated legs- storchy.  Normal tone without cog- wheeling, symmetric grip strength .   Sensory:  Fine touch and vibration were absent in the feet.  Proprioception tested in the upper extremities was normal.   Coordination: Rapid alternating movements in the  fingers/hands were of normal speed.  The Finger-to-nose maneuver was intact without evidence of ataxia, dysmetria or tremor.   Gait and station: Patient could rise unassisted from a seated position, walked with a cane - assistive device.  Stance is of wide base .  Toe and heel walk were deferred.  Deep tendon reflexes: in the  upper and lower extremities are trace only.  Babinski response was deferred.       After spending a total time of  45  minutes face to face and additional time for physical and neurologic examination, review of laboratory studies,  personal review of imaging studies, reports and results of other testing and review of referral information / records as far as provided in visit, I have established the  following assessments:  1)  This patient with long standing apnea and CPAP dependency, in need of a new machine and baseline study - I will order a HST for him as he has  transportation problems and feels safest at home.  2) co-morbidities affecting sleep are neuropathic pain, PLMs, CKD, DM, CHF, and Obesity.  3) he is at risk for central apnea.    My Plan is to proceed with:  1) HST for comfort, ASAP.  2) I have not had an opportunity to see his machines settings, or AHI , but he uses a nasal pillow.    I would like to thank Dettinger, Fransisca Kaufmann, MD  allowing me to meet with and to take care of this pleasant patient.   In short, Manuel Kline is presenting with insufficiently treated OSA/ CSa, Hypersomnia -symptom that can explained by his ill fitting CPAP device.   I plan to follow up either personally or through our NP within 1-2 months   CC: I will share my notes with PCP. Marland Kitchen  Electronically signed by: Larey Seat, MD 10/21/2021 1:12 PM  [facility] Board certified by The [employer] of Sleep Medicine and Diplomate of the [employer] of Sleep Medicine. Board certified In Neurology through the [place], Fellow of the  [employer] of Neurology. Medical Director of [facility].

## 2021-10-21 NOTE — Patient Instructions (Signed)
Screening for Sleep Apnea Sleep apnea is a condition in which breathing pauses or becomes shallow during sleep. Sleep apnea screening is a test to determine if you are at risk for sleep apnea. The test includes a series of questions. It will only takes a few minutes. Your health care provider may ask you to have this test in preparation for surgery or as part of a physical exam. What are the symptoms of sleep apnea? Common symptoms of sleep apnea include: Snoring. Waking up often at night. Daytime sleepiness. Pauses in breathing. Choking or gasping during sleep. Irritability. Forgetfulness. Trouble thinking clearly. Depression. Personality changes. Most people with sleep apnea do not know that they have it. What are the advantages of sleep apnea screening? Getting screened for sleep apnea can help: Ensure your safety. It is important for your health care providers to know whether or not you have sleep apnea, especially if you are having surgery or have other long-term (chronic) health conditions. Improve your health and allow you to get a better night's rest. Restful sleep can help you: Have more energy. Lose weight. Improve high blood pressure. Improve diabetes management. Prevent stroke. Prevent car accidents. What happens during the screening? Screening usually includes being asked a list of questions about your sleep quality. Some questions you may be asked include: Do you snore? Is your sleep restless? Do you have daytime sleepiness? Has a partner or spouse told you that you stop breathing during sleep? Have you had trouble concentrating or memory loss? What is your age? What is your neck circumference? To measure your neck, keep your back straight and gently wrap the tape measure around your neck. Put the tape measure at the middle of your neck, between your chin and collarbone. What is your sex assigned at birth? Do you have or are you being treated for high blood  pressure? If your screening test is positive, you are at risk for the condition. Further testing may be needed to confirm a diagnosis of sleep apnea. Where to find more information You can find screening tools online or at your health care clinic. For more information about sleep apnea screening and healthy sleep, visit these websites: Centers for Disease Control and Prevention: www.cdc.gov American Sleep Apnea Association: www.sleepapnea.org Contact a health care provider if: You think that you may have sleep apnea. Summary Sleep apnea screening can help determine if you are at risk for sleep apnea. It is important for your health care providers to know whether or not you have sleep apnea, especially if you are having surgery or have other chronic health conditions. You may be asked to take a screening test for sleep apnea in preparation for surgery or as part of a physical exam. This information is not intended to replace advice given to you by your health care provider. Make sure you discuss any questions you have with your health care provider. Document Revised: 07/26/2020 Document Reviewed: 07/26/2020 Elsevier Patient Education  2022 Elsevier Inc.  

## 2021-10-22 ENCOUNTER — Ambulatory Visit (INDEPENDENT_AMBULATORY_CARE_PROVIDER_SITE_OTHER): Payer: Medicare Other

## 2021-10-22 DIAGNOSIS — I255 Ischemic cardiomyopathy: Secondary | ICD-10-CM | POA: Diagnosis not present

## 2021-10-22 LAB — CUP PACEART REMOTE DEVICE CHECK
Battery Remaining Longevity: 50 mo
Battery Voltage: 2.98 V
Brady Statistic AP VP Percent: 0.44 %
Brady Statistic AP VS Percent: 81.65 %
Brady Statistic AS VP Percent: 0.02 %
Brady Statistic AS VS Percent: 17.89 %
Brady Statistic RA Percent Paced: 82.09 %
Brady Statistic RV Percent Paced: 0.45 %
Date Time Interrogation Session: 20230222112609
HighPow Impedance: 51 Ohm
HighPow Impedance: 70 Ohm
Implantable Lead Implant Date: 20030519
Implantable Lead Implant Date: 20030519
Implantable Lead Location: 753859
Implantable Lead Location: 753860
Implantable Lead Model: 158
Implantable Lead Model: 4087
Implantable Lead Serial Number: 115102
Implantable Lead Serial Number: 159999
Implantable Pulse Generator Implant Date: 20171114
Lead Channel Impedance Value: 4047 Ohm
Lead Channel Impedance Value: 4047 Ohm
Lead Channel Impedance Value: 4047 Ohm
Lead Channel Impedance Value: 437 Ohm
Lead Channel Impedance Value: 494 Ohm
Lead Channel Impedance Value: 513 Ohm
Lead Channel Pacing Threshold Amplitude: 0.875 V
Lead Channel Pacing Threshold Amplitude: 1.25 V
Lead Channel Pacing Threshold Pulse Width: 0.4 ms
Lead Channel Pacing Threshold Pulse Width: 0.4 ms
Lead Channel Sensing Intrinsic Amplitude: 10.75 mV
Lead Channel Sensing Intrinsic Amplitude: 10.75 mV
Lead Channel Sensing Intrinsic Amplitude: 2.5 mV
Lead Channel Sensing Intrinsic Amplitude: 2.5 mV
Lead Channel Setting Pacing Amplitude: 2.5 V
Lead Channel Setting Pacing Amplitude: 2.5 V
Lead Channel Setting Pacing Pulse Width: 0.4 ms
Lead Channel Setting Sensing Sensitivity: 0.3 mV

## 2021-10-23 ENCOUNTER — Telehealth: Payer: Self-pay

## 2021-10-23 NOTE — Telephone Encounter (Signed)
Pt will call back to schedule his sleep study

## 2021-10-27 ENCOUNTER — Telehealth: Payer: Self-pay | Admitting: Family Medicine

## 2021-10-29 ENCOUNTER — Ambulatory Visit: Payer: Medicare Other | Admitting: Podiatry

## 2021-10-29 ENCOUNTER — Other Ambulatory Visit: Payer: Self-pay

## 2021-10-29 ENCOUNTER — Encounter: Payer: Self-pay | Admitting: Podiatry

## 2021-10-29 ENCOUNTER — Ambulatory Visit: Payer: Medicare Other

## 2021-10-29 DIAGNOSIS — L84 Corns and callosities: Secondary | ICD-10-CM

## 2021-10-29 DIAGNOSIS — B351 Tinea unguium: Secondary | ICD-10-CM | POA: Diagnosis not present

## 2021-10-29 DIAGNOSIS — E1142 Type 2 diabetes mellitus with diabetic polyneuropathy: Secondary | ICD-10-CM

## 2021-10-29 DIAGNOSIS — M216X9 Other acquired deformities of unspecified foot: Secondary | ICD-10-CM

## 2021-10-29 DIAGNOSIS — D689 Coagulation defect, unspecified: Secondary | ICD-10-CM

## 2021-10-29 DIAGNOSIS — E1169 Type 2 diabetes mellitus with other specified complication: Secondary | ICD-10-CM

## 2021-10-29 DIAGNOSIS — M79674 Pain in right toe(s): Secondary | ICD-10-CM

## 2021-10-29 DIAGNOSIS — E0843 Diabetes mellitus due to underlying condition with diabetic autonomic (poly)neuropathy: Secondary | ICD-10-CM

## 2021-10-29 DIAGNOSIS — M79675 Pain in left toe(s): Secondary | ICD-10-CM

## 2021-10-29 NOTE — Progress Notes (Signed)
Remote ICD transmission.   

## 2021-10-29 NOTE — Progress Notes (Signed)
SITUATION ?Reason for Consult: Evaluation for Prefabricated Diabetic Shoes and Custom Diabetic Inserts. ?Patient / Caregiver Report: Patient would like well fitting shoes ? ?OBJECTIVE DATA: ?Patient History / Diagnosis:  ?  ICD-10-CM   ?1. Type 2 diabetes mellitus with other specified complication, without long-term current use of insulin (HCC)  E11.69   ?  ?2. Callus of foot  L84   ?  ? ? ?Current or Previous Devices:   B5000M 9XW ? ?In-Person Foot Examination: ?Ulcers & Callousing:   5th met head bilateral ? ?Deformities:   ?- Hallux valgus  ?- Hammertoes ?  ?Shoe Size: 9XW ? ?ORTHOTIC RECOMMENDATION ?Recommended Devices: ?- 1x pair prefabricated PDAC approved diabetic shoes; Patient Selected - Apex A4000 Black Boot Size 9XW ?- 3x pair custom-to-patient PDAC approved vacuum formed diabetic insoles. ? ?GOALS OF SHOES AND INSOLES ?- Reduce shear and pressure ?- Reduce / Prevent callus formation ?- Reduce / Prevent ulceration ?- Protect the fragile healing compromised diabetic foot. ? ?Patient would benefit from diabetic shoes and inserts as patient has diabetes mellitus and the patient has one or more of the following conditions: ?- History of pre-ulcerative callus ?- Peripheral neuropathy with evidence of callus formation ?- Foot deformity ?- Poor circulation ? ?ACTIONS PERFORMED ?Patient was casted for insoles via crush box and measured for shoes via brannock device. Procedure was explained and patient tolerated procedure well. All questions were answered and concerns addressed. ? ?PLAN ?Patient is to be contacted and scheduled for fitting once CMN is obtained from treaing physician and shoes and insoles have been fabricated and received. ? ?

## 2021-10-29 NOTE — Progress Notes (Signed)
This patient returns to my office for at risk foot care.  This patient requires this care by a professional since this patient will be at risk due to having diabetic neuropathy claudication in PVD and CKD.  This patient is unable to cut nails himself since the patient cannot reach his nails.These nails are painful walking and wearing shoes.  Patient also has painful callus under the ball of his left foot.  He presents to the office for painful callus on the outside of left forefoot.  This patient presents for at risk foot care today. ? ?General Appearance  Alert, conversant and in no acute stress. ? ?Vascular  Dorsalis pedis and posterior tibial  pulses are weakly  palpable  bilaterally.  Capillary return is within normal limits  bilaterally  Cold feet  bilaterally.  Absent digital hair noted. ? ?Neurologic  Senn-Weinstein monofilament wire test diminished  bilaterally. Muscle power within normal limits bilaterally. ? ?Nails Thick disfigured discolored nails with subungual debris  from hallux to fifth toes bilaterally. No evidence of bacterial infection or drainage bilaterally. ? ?Orthopedic  No limitations of motion  feet .  No crepitus or effusions noted.  No bony pathology or digital deformities noted.  Plantar flexed fifth metatarsal head left foot.   ? ?Skin  normotropic skin with no porokeratosis noted bilaterally.  No signs of infections or ulcers noted.    ? ?Onychomycosis  Pain in right toes  Pain in left toes  Porokeratosis sub 5th left foot. ? ?Consent was obtained for treatment procedures.   Mechanical debridement of nails 1-5  bilaterally performed with a nail nipper.  Filed with dremel without incident. Debride callus with # 15 blade followed by dremel tool. Padding/cut out sub 5th met lef  He is to be seen by Aaron Edelman today. ? ? ?Return office visit  3 months                    Told patient to return for periodic foot care and evaluation due to potential at risk complications. ? ? ?Gardiner Barefoot DPM   ?

## 2021-10-30 ENCOUNTER — Telehealth: Payer: Self-pay | Admitting: Family Medicine

## 2021-10-30 ENCOUNTER — Telehealth: Payer: Self-pay

## 2021-10-30 NOTE — Telephone Encounter (Signed)
Pt has been informed and had picked up the lantus ?

## 2021-10-30 NOTE — Telephone Encounter (Signed)
Pt called to let Almyra Free know that he is out of both of his shots (Humulin and Basaglar). ?

## 2021-10-30 NOTE — Telephone Encounter (Signed)
Casts sent to central fabrication - HOLD FOR CMN °

## 2021-10-30 NOTE — Telephone Encounter (Signed)
Clinical: ?I will leave him lantus in the fridge (to replace basaglar) ?I do not have any humulin ? ?Rosendo Gros can you look into his lilly cares PAP ?I know there have been issues since dose changes etc ? ?Thank you ?

## 2021-10-30 NOTE — Telephone Encounter (Signed)
App refaxed 10/29/21 per Almyra Free. Will f/u in about 5 business days to make sure all pages rec'd. ?

## 2021-11-03 ENCOUNTER — Ambulatory Visit (INDEPENDENT_AMBULATORY_CARE_PROVIDER_SITE_OTHER): Payer: Medicare Other | Admitting: Neurology

## 2021-11-03 DIAGNOSIS — I5022 Chronic systolic (congestive) heart failure: Secondary | ICD-10-CM

## 2021-11-03 DIAGNOSIS — I255 Ischemic cardiomyopathy: Secondary | ICD-10-CM

## 2021-11-03 DIAGNOSIS — G4761 Periodic limb movement disorder: Secondary | ICD-10-CM

## 2021-11-03 DIAGNOSIS — Z95810 Presence of automatic (implantable) cardiac defibrillator: Secondary | ICD-10-CM

## 2021-11-03 DIAGNOSIS — G4733 Obstructive sleep apnea (adult) (pediatric): Secondary | ICD-10-CM

## 2021-11-03 DIAGNOSIS — I739 Peripheral vascular disease, unspecified: Secondary | ICD-10-CM

## 2021-11-03 DIAGNOSIS — I6340 Cerebral infarction due to embolism of unspecified cerebral artery: Secondary | ICD-10-CM

## 2021-11-04 ENCOUNTER — Ambulatory Visit: Payer: Medicare Other | Admitting: Pharmacist

## 2021-11-04 DIAGNOSIS — Z794 Long term (current) use of insulin: Secondary | ICD-10-CM

## 2021-11-04 DIAGNOSIS — E782 Mixed hyperlipidemia: Secondary | ICD-10-CM

## 2021-11-04 DIAGNOSIS — E1169 Type 2 diabetes mellitus with other specified complication: Secondary | ICD-10-CM

## 2021-11-04 MED ORDER — INSULIN REGULAR HUMAN 100 UNIT/ML IJ SOLN: 30.0000 [IU] | Freq: Two times a day (BID) | INTRAMUSCULAR | 3 refills | Status: DC

## 2021-11-05 NOTE — Procedures (Signed)
?[facility] ?  ?HOME SLEEP TEST REPORT ( by Watch PAT)   ?STUDY DATE: 11-04-2021  ?  ?ORDERING CLINICIAN: Larey Seat, MD  ? ?Primary neurologist 2020: Dr Leonie Man, MD  ?  ?CLINICAL INFORMATION/HISTORY: referral on 10/21/2021 from Dr Warrick Parisian, MD.  ?  ?Chief concern according to patient :  Rm 10, w daughter Chiarelli. Internal referral for OSA. Last SS done in 2015. Pt is severely hearing impaired, sleeps more than usual, has neuropathy-pacemaker, CKD , here to re visit since last visit with Dr. Leonie Man in 2020 for TIA and a small stroke, he was incoherent, had a facial droop.  ?Pt has been using his CPAP nightly but seems to not be working properly.  ?He carried the diagnosis of a complicated or so-called complex sleep apnea syndrome. The patient was placed on a VPAP because of his cardiac history including cardiomyopathy, coronary artery disease, congestive heart failure and he has a defibrillator implanted. His residual apnea index was 7.5 his sleep study in 2007 diagnosed him with an AHI of 77 / per hour of sleep.  ?I understand that this machine was prescribed in 2015 -so it is by now 49-1/86 years old and it would need to be replaced in any case.  Our possibilities of testing however are quite different from the time he first got treated.   ?We can do a home sleep test to simply confirm the presence of sleep apnea and then based on that order a CPAP device for him.   ?If he has complex sleep apnea so-called central sleep apnea we would still prefer for the patient to come to the sleep lab to be titrated as some patients get worse on the CPAP. ?  ?  ?Epworth sleepiness score: 16/24. ?  ?BMI: 38 kg/m? ?  ?Neck Circumference: 17" ?  ?FINDINGS: ?  ?Sleep Summary: ?  ?Total Recording Time (hours, min):    9 hours and 57 minutes of which the total sleep time was calculated to be 9 hours and 37 minutes.  The percentage of REM sleep time was 21.8% ?  ?Respiratory Indices: ?  ?Calculated pAHI (per hour):    42.2/h  and in rem sleep more than double as high as in non-REM sleep.  REM AHI was 72.5 and non-REM AHI was 33.1/h.                       ?                 ?Positional AHI: In supine sleep AHI was 71/h in prone sleep 27, on the right side 8.4/h on the left 29.9/h.  There is a clear dominance of supine sleep in which the patient slept for 207 minutes. ? ?Snoring level at mean volume was 41 dB and was present for about 20% of total sleep time.                                               ?  ?Oxygen Saturation Statistics: ?  ?O2 Saturation Range (%):        Between a nadir at 75% and a maximum of 98% with a mean oxygen saturation of 92%.                             ?  ?  O2 Saturation (minutes) <89%:     34.9 minutes, mostly during REM sleep. ? ?O2 saturation in minutes under 90%; 48.4 minutes the equivalent of 8.4% of total sleep time    ?  ?Pulse Rate Statistics:            ?  ?Pulse Range:      Varied between 38 and 132 bpm, this home sleep test cannot detect cardiac rhythm abnormalities.        ?  ?IMPRESSION:  This HST confirms the presence of severe sleep apnea and yes this patient will continue to need positive airway pressure therapy to overcome apnea and hypoxia.  It would also be beneficial if he could avoid sleeping in supine position which would require that he has a mask allowing him to sleep on his side. ?I see no evidence of dominant central sleep apnea, but a 26% Cheyne-Stokes respiration percentage is listed in his events. ?Therefore his apnea is still complex predominantly obstructive and severe. ?Unfortunately, I did not have the settings of his current CPAP machine available.  I considered the patient's CPAP dependent. ? ?  ?RECOMMENDATION: ?Auto titration CPAP device with a setting between 6 and 16 cm water pressure, 2 cm EPR, heated humidification and mask of patient's choice.  He is using currently a nasal pillow I would like him to continue the use of a nasal pillow.  I will insist on a ResMed machine  with heated humidification. ? ?  ?INTERPRETING PHYSICIAN: ? ? Larey Seat, MD  ? ?Medical Director of [facility] [employer].  ? ? ? ? ? ? ? ? ? ? ? ? ? ? ? ? ? ? ?

## 2021-11-05 NOTE — Progress Notes (Signed)
This HST confirms the presence of severe sleep apnea and yes this patient will continue to need positive airway pressure therapy to overcome apnea and hypoxia.  It would also be beneficial if he could avoid sleeping in supine position which would require that he has a mask allowing him to sleep on his side. ?I see no evidence of dominant central sleep apnea, but a 26% Cheyne-Stokes respiration percentage is listed in his events. ?42.2/h and in rem sleep more than double as high as in non-REM sleep.  REM AHI was 72.5 and non-REM AHI was 33.1/h.  O2 saturation in minutes under 90%; 48.4 minutes the equivalent of 8.4% of total sleep time?. ? ??? ??                    Therefore his apnea is still complex predominantly obstructive and severe. ?Unfortunately, I did not have the settings of his current CPAP machine available.  I considered the patient's CPAP dependent. ?? ?? ?RECOMMENDATION: ?Auto titration CPAP device with a setting between 6 and 16 cm water pressure, 2 cm EPR, heated humidification and mask of patient's choice.  He is using currently a nasal pillow I would like him to continue the use of a nasal pillow.  I will insist on a ResMed machine with heated humidification. ??

## 2021-11-05 NOTE — Telephone Encounter (Signed)
Hey camille! I think I messed up on the humulin R page--it doesn't come in [facility]--I re-faxed as well, but please fax behind me! ? ?Thanks! Teyah Rossy ?

## 2021-11-05 NOTE — Addendum Note (Signed)
Addended by: Larey Seat on: 11/05/2021 05:14 PM ? ? Modules accepted: Orders ? ?

## 2021-11-05 NOTE — Progress Notes (Signed)
? ? ?  ?  ?[facility] ?  ?HOME SLEEP TEST REPORT ( by Watch PAT)   ?STUDY DATE: 11-04-2021  ?  ?ORDERING CLINICIAN: Larey Seat, MD  ? ?Primary neurologist 2020: Dr Leonie Man, MD  ?  ?CLINICAL INFORMATION/HISTORY: referral on 10/21/2021 from Dr Warrick Parisian, MD.  ?  ?Chief concern according to patient :  Rm 10, w daughter Hellstrom. Internal referral for OSA. Last SS done in 2015. Pt is severely hearing impaired, sleeps more than usual, has neuropathy-pacemaker, CKD , here to re visit since last visit with Dr. Leonie Man in 2020 for TIA and a small stroke, he was incoherent, had a facial droop.  ?Pt has been using his CPAP nightly but seems to not be working properly.  ?He carried the diagnosis of a complicated or so-called complex sleep apnea syndrome. The patient was placed on a VPAP because of his cardiac history including cardiomyopathy, coronary artery disease, congestive heart failure and he has a defibrillator implanted. His residual apnea index was 7.5 his sleep study in 2007 diagnosed him with an AHI of 77 / per hour of sleep.  ?I understand that this machine was prescribed in 2015 -so it is by now 49-1/86 years old and it would need to be replaced in any case.  Our possibilities of testing however are quite different from the time he first got treated.   ?We can do a home sleep test to simply confirm the presence of sleep apnea and then based on that order a CPAP device for him.   ?If he has complex sleep apnea so-called central sleep apnea we would still prefer for the patient to come to the sleep lab to be titrated as some patients get worse on the CPAP. ?  ?  ?Epworth sleepiness score: 16/24. ?  ?BMI: 38 kg/m? ?  ?Neck Circumference: 17" ?  ?FINDINGS: ?  ?Sleep Summary: ?  ?Total Recording Time (hours, min):    9 hours and 57 minutes of which the total sleep time was calculated to be 9 hours and 37 minutes.  The percentage of REM sleep time was 21.8% ?  ?Respiratory Indices: ?  ?Calculated pAHI (per hour):     42.2/h and in rem sleep more than double as high as in non-REM sleep.  REM AHI was 72.5 and non-REM AHI was 33.1/h.                       ?                 ?Positional AHI: In supine sleep AHI was 71/h in prone sleep 27, on the right side 8.4/h on the left 29.9/h.  There is a clear dominance of supine sleep in which the patient slept for 207 minutes. ? ?Snoring level at mean volume was 41 dB and was present for about 20% of total sleep time.                                               ?  ?Oxygen Saturation Statistics: ?  ?O2 Saturation Range (%):        Between a nadir at 75% and a maximum of 98% with a mean oxygen saturation of 92%.                             ?  ?  O2 Saturation (minutes) <89%:     34.9 minutes, mostly during REM sleep. ? ?O2 saturation in minutes under 90%; 48.4 minutes the equivalent of 8.4% of total sleep time    ?  ?Pulse Rate Statistics:            ?  ?Pulse Range:      Varied between 38 and 132 bpm, this home sleep test cannot detect cardiac rhythm abnormalities.        ?  ?IMPRESSION:  This HST confirms the presence of severe sleep apnea and yes this patient will continue to need positive airway pressure therapy to overcome apnea and hypoxia.  It would also be beneficial if he could avoid sleeping in supine position which would require that he has a mask allowing him to sleep on his side. ?I see no evidence of dominant central sleep apnea, but a 26% Cheyne-Stokes respiration percentage is listed in his events. ?Therefore his apnea is still complex predominantly obstructive and severe. ?Unfortunately, I did not have the settings of his current CPAP machine available.  I considered the patient's CPAP dependent. ? ?  ?RECOMMENDATION: ?Auto titration CPAP device with a setting between 6 and 16 cm water pressure, 2 cm EPR, heated humidification and mask of patient's choice.  He is using currently a nasal pillow I would like him to continue the use of a nasal pillow.  I will insist on a ResMed  machine with heated humidification. ? ?  ?INTERPRETING PHYSICIAN: ? ? Larey Seat, MD  ? ?Medical Director of [facility] [employer].  ? ? ? ? ? ? ? ? ? ? ? ? ? ? ? ? ? ? ? ? ?

## 2021-11-06 NOTE — Progress Notes (Signed)
Chronic Care Management Pharmacy Note  11/04/2021 Name:  Manuel Kline MRN:  [id number] DOB:  08-13-1932  Summary: T2DM, CKD  Recommendations/Changes made from today's visit: Diabetes: Goal on Track: NO. Uncontrolled-A1C 7.9%, GFR 37-->30;  Current treatment: meds were initially chosen prior to pharmd appt due to cost/patient comfort; we are working to optimize them INCREASE TRULICITY TO 3MG SQ WEEKLY  Denies personal and family history of Medullary thyroid cancer (MTC) Continue Basaglar 25 units once daily (patient has been taking twice daily) START FARXIGA 10MG DAILY-gfr in the 30s (sees nephro) WILL ENROLL IN [place] PATIENT ASSISTANCE PROGRAM DECREASE Humulin R to 20 units twice daily with meals   Subjective: Manuel Kline is an 88 y.o. year old male who is a primary patient of Dettinger, Fransisca Kaufmann, MD.  The CCM team was consulted for assistance with disease management and care coordination needs.    Engaged with patient face to face for follow up visit in response to provider referral for pharmacy case management and/or care coordination services.   Consent to Services:  The patient was given information about Chronic Care Management services, agreed to services, and gave verbal consent prior to initiation of services.  Please see initial visit note for detailed documentation.   Patient Care Team: Dettinger, Fransisca Kaufmann, MD as PCP - General (Family Medicine) Evans Lance, MD as PCP - Electrophysiology (Cardiology) Park Liter, MD as PCP - Cardiology (Cardiology) Regenia Skeeter, MD as Consulting Physician (Internal Medicine) Wallene Huh, DPM as Consulting Physician (Podiatry) Jacolyn Reedy, MD as Consulting Physician (Cardiology) Hurman Horn, MD as Consulting Physician (Ophthalmology) Evans Lance, MD as Consulting Physician (Cardiology) Gardiner Barefoot, DPM as Consulting Physician (Podiatry) Pleasant, Eppie Gibson, RN as [place]  Management Lavera Guise, [facility] (Pharmacist)  Objective:  Lab Results  Component Value Date   CREATININE 2.11 (H) 10/02/2021   CREATININE 1.96 (H) 07/02/2021   CREATININE 2.10 (H) 12/24/2020    Lab Results  Component Value Date   HGBA1C 7.9 (H) 10/02/2021   Last diabetic Eye exam:  Lab Results  Component Value Date/Time   HMDIABEYEEXA Retinopathy (A) 03/06/2021 12:00 AM    Last diabetic Foot exam: No results found for: HMDIABFOOTEX      Component Value Date/Time   CHOL 126 10/02/2021 0912   TRIG 169 (H) 10/02/2021 0912   HDL 40 10/02/2021 0912   CHOLHDL 3.2 10/02/2021 0912   CHOLHDL 3.4 12/26/2018 0558   VLDL 36 12/26/2018 0558   LDLCALC 58 10/02/2021 0912    Hepatic Function Latest Ref Rng & Units 10/02/2021 07/02/2021 12/24/2020  Total Protein 6.0 - 8.5 g/dL 6.9 6.9 6.8  Albumin 3.6 - 4.6 g/dL 4.6 4.4 4.1  AST 0 - 40 IU/L 26 30 24   ALT 0 - 44 IU/L 26 29 28   Alk Phosphatase 44 - 121 IU/L 122(H) 117 120  Total Bilirubin 0.0 - 1.2 mg/dL 0.6 0.6 0.5    Lab Results  Component Value Date/Time   TSH 1.421 12/25/2018 10:40 PM    CBC Latest Ref Rng & Units 10/02/2021 07/02/2021 12/24/2020  WBC 3.4 - 10.8 x10E3/uL 7.3 7.7 7.7  Hemoglobin 13.0 - 17.7 g/dL 13.9 13.9 13.4  Hematocrit 37.5 - 51.0 % 41.1 40.0 38.7  Platelets 150 - 450 x10E3/uL 182 211 178    No results found for: VD25OH  Clinical ASCVD: Yes  The ASCVD Risk score (Arnett DK, et al., 2019) failed to calculate for the  following reasons:   The 2019 ASCVD risk score is only valid for ages 24 to 9   The patient has a prior MI or stroke diagnosis    Other: (CHADS2VASc if Afib, PHQ9 if depression, MMRC or CAT for COPD, ACT, DEXA)  Social History   Tobacco Use  Smoking Status Never  Smokeless Tobacco Never   BP Readings from Last 3 Encounters:  10/21/21 (!) 153/75  10/02/21 125/71  09/03/21 (!) 141/64   Pulse Readings from Last 3 Encounters:  10/21/21 86  10/02/21 80  09/03/21 69   Wt Readings from  Last 3 Encounters:  10/21/21 232 lb 8 oz (105.5 kg)  10/02/21 230 lb (104.3 kg)  09/03/21 229 lb (103.9 kg)    Assessment: Review of patient past medical history, allergies, medications, health status, including review of consultants reports, laboratory and other test data, was performed as part of comprehensive evaluation and provision of chronic care management services.   SDOH:  (Social Determinants of Health) assessments and interventions performed:    CCM Care Plan  No Known Allergies  Medications Reviewed Today     Reviewed by Gardiner Barefoot, DPM (Physician) on 10/29/21 at 9  Med List Status: <None>   Medication Order Taking? Sig Documenting Provider Last Dose Status Informant  benazepril (LOTENSIN) 40 MG tablet [id number] No Take 1 tablet (40 mg total) by mouth daily. Dettinger, Fransisca Kaufmann, MD Taking Active   cloNIDine (CATAPRES) 0.1 MG tablet [id number] No Take 1 tablet by mouth twice daily  Patient taking differently: Take 0.1 mg by mouth 2 (two) times daily.   Dettinger, Fransisca Kaufmann, MD Taking Active   clopidogrel (PLAVIX) 75 MG tablet [id number] No Take 1 tablet (75 mg total) by mouth daily. Dettinger, Fransisca Kaufmann, MD Taking Active   Cyanocobalamin (VITAMIN B 12 PO) [id number] No Take 1,000 mcg by mouth daily. [provider] Taking Active Spouse/Significant Other  dapagliflozin propanediol (FARXIGA) 10 MG TABS tablet [id number] No Take 1 tablet (10 mg total) by mouth daily before breakfast. Dettinger, Fransisca Kaufmann, MD Taking Active   Dulaglutide (TRULICITY) 3 TT/0.1XB SOPN [id number] No Inject 3 mg as directed once a week. [name] patient assistance program Dettinger, Fransisca Kaufmann, MD Taking Active            Med Note Mickie Hillier Oct 05, 2021 11:21 PM) [place] patient assistance program    DULoxetine (CYMBALTA) 30 MG capsule [id number] No Take 1 capsule (30 mg total) by mouth daily. Dettinger, Fransisca Kaufmann, MD Taking Active   furosemide (LASIX) 40 MG tablet  [id number] No Take 1 tablet (40 mg total) by mouth 2 (two) times daily. Dettinger, Fransisca Kaufmann, MD Taking Active   gabapentin (NEURONTIN) 300 MG capsule [id number] No Take 1 capsule (300 mg total) by mouth 2 (two) times daily. Dettinger, Fransisca Kaufmann, MD Taking Active   Insulin Glargine [facility]) 100 UNIT/ML SOPN [id number] No Inject 0.25 mLs (25 Units total) into the skin daily.  Patient taking differently: Inject 32 Units into the skin daily.   Dettinger, Fransisca Kaufmann, MD Taking Active            Med Note Blanca Friend, Omer Jack Oct 05, 2021 11:21 PM) [place] patient assistance program    insulin regular (HUMULIN R) 100 units/mL injection [id number] No Inject 0.3 mLs (30 Units total) into the skin 2 (two) times daily before a meal.  Patient taking differently: Inject 25 Units into the skin 2 (  two) times daily before a meal.   Dettinger, Fransisca Kaufmann, MD Taking Active            Med Note Blanca Friend, Omer Jack Oct 05, 2021 11:21 PM) [place] patient assistance program    metoprolol succinate (TOPROL-XL) 50 MG 24 hr tablet [id number] No Take 1 tablet (50 mg total) by mouth daily. Take with or immediately following a meal. Dettinger, Fransisca Kaufmann, MD Taking Active   nitroGLYCERIN (NITROSTAT) 0.4 MG SL tablet [id number] No Place 1 tablet (0.4 mg total) under the tongue every 5 (five) minutes as needed for chest pain. Dettinger, Fransisca Kaufmann, MD Taking Active   Omega-3 Fatty Acids (FISH OIL) 1000 MG CPDR [id number] No Take 1 tablet by mouth daily. [provider] Taking Active Spouse/Significant Other  polyethylene glycol powder (GLYCOLAX/MIRALAX) powder [id number] No Take 17 g by mouth 2 (two) times daily as needed.  Patient taking differently: Take 17 g by mouth 2 (two) times daily as needed for mild constipation or moderate constipation.   Eustaquio Maize, MD Taking Active   rosuvastatin (CRESTOR) 5 MG tablet [id number] No Take 1 tablet (5 mg total) by mouth daily. Dettinger, Fransisca Kaufmann, MD Taking  Active             Patient Active Problem List   Diagnosis Date Noted   Callus 10/29/2021   Glaucoma suspect with open angle 04/02/2021   Pseudophakia of both eyes 08/20/2020   Claudication in peripheral vascular disease ([place]) 07/23/2020   Sleep apnea    Osteoarthritis    Metabolic syndrome    Erosive esophagitis    Erectile dysfunction    Calcium oxalate renal stones    Moderate nonproliferative diabetic retinopathy of both eyes without macular edema associated with type 2 diabetes mellitus ([place]) 02/20/2020   Posterior vitreous detachment of both eyes 02/20/2020   History of TIA (transient ischemic attack) 01/19/2019   Cerebral embolism with cerebral infarction 12/26/2018   CKD (chronic kidney disease) stage 4, GFR 15-29 ml/min (HCC) 11/08/2017   Presence of automatic (implantable) cardiac defibrillator 11/08/2017   Ventricular tachycardia (paroxysmal) 07/14/2016   Essential hypertension 11/30/2015   Diabetic peripheral neuropathy ([place]) 01/31/2015   OSA on CPAP 11/05/2014   CAD (coronary artery disease)    Dual implantable cardioverter-defibrillator in situ    Hyperlipidemia    Type 2 diabetes mellitus with other specified complication ([place])    Morbid obesity ([place])    Ischemic cardiomyopathy    Chronic systolic heart failure ([place])    S/P CABG (coronary artery bypass graft) 11/02/2000    Immunization History  Administered Date(s) Administered   Fluad Quad(high Dose 65+) 06/26/2019, 05/22/2020, 06/16/2021   Influenza, High Dose Seasonal PF 06/14/2017, 07/19/2018   Influenza,inj,Quad PF,6+ Mos 06/12/2014, 06/24/2015, 06/17/2016   Moderna Sars-Covid-2 Vaccination 09/12/2019, 10/13/2019, 07/04/2020   Pneumococcal Conjugate-13 03/01/2015   Pneumococcal Polysaccharide-23 03/16/2016   Tdap 02/16/2020   Zoster Recombinat (Shingrix) 02/17/2020, 09/17/2020    Conditions to be addressed/monitored: DMII and CKD Stage 3B  Care Plan : PHARMD MEDICATION MANAGEMENT  Updates  made by Lavera Guise, [place] since 11/06/2021 12:00 AM     Problem: CHL AMB "PATIENT-SPECIFIC PROBLEM"      Long-Range Goal: T2DM ,HLD PHARMD GOAL   Recent Progress: Not on track  Priority: High  Note:   Current Barriers:  Unable to independently afford treatment regimen Unable to maintain control of T2DM Suboptimal therapeutic regimen for T2DM  Pharmacist Clinical Goal(s):  patient will verbalize  ability to afford treatment regimen achieve control of T2DM as evidenced by GOAL A1C<7% adhere to plan to optimize therapeutic regimen for T2DM as evidenced by report of adherence to recommended medication management changes through collaboration with PharmD and provider.   Interventions: 1:1 collaboration with Dettinger, Fransisca Kaufmann, MD regarding development and update of comprehensive plan of care as evidenced by provider attestation and co-signature Inter-disciplinary care team collaboration (see longitudinal plan of care) Comprehensive medication review performed; medication list updated in electronic medical record  Diabetes: Goal on Track (progressing): YES. Uncontrolled-A1C 7.9%, GFR 37-->30;  Current treatment: meds were initially chosen prior to pharmd appt due to cost/patient comfort; we are working to optimize them [place]  Denies personal and family history of Medullary thyroid cancer (MTC) Continue Basaglar 25 units once daily (patient has been taking twice daily) START FARXIGA 10MG DAILY-gfr in the 30s (sees nephro) WILL ENROLL IN [place] PATIENT ASSISTANCE PROGRAM DECREASE Humulin R to 20 units twice daily with meals   Patient enrolled in PAP with [name] for TRULICITY, [place] R RXS ESCRIBED TO [facility] [place]; ships 52-monthsupplies to patient's home every quarter(due to back orders unlikely to ship more than 1 month at a time) Current glucose readings: fasting glucose: <200, post prandial glucose:  200s Denies hypoglycemic/hyperglycemic symptoms Discussed meal planning options and Plate method for healthy eating Avoid sugary drinks and desserts Incorporate balanced protein, non starchy veggies, 1 serving of carbohydrate with each meal Increase water intake Increase physical activity as able Current exercise: patient unable due to physical condition Recommended increase glp1; work to optimize insulin regimen and POs for T2DM Assessed patient finances. Re enrollment and dose changes sent to [employer] patient assistance program--will follow  Patient Goals/Self-Care Activities patient will:  - take medications as prescribed as evidenced by patient report and record review check glucose daily (3 times) or when symptomatic, document, and provide at future appointments collaborate with provider on medication access solutions engage in dietary modifications by   FOLLOWING A HEART HEALTHY DIET/HEALTHY PLATE METHOD      Follow Up:  Patient agrees to Care Plan and Follow-up.  Plan: Telephone follow up appointment with care management team member scheduled for:  3 MONTHS   JRegina Eck PharmD, BCPS Clinical Pharmacist, [place] Phone [phone]

## 2021-11-06 NOTE — Patient Instructions (Signed)
Visit Information ? ?Following are the goals we discussed today:  ?Current Barriers:  ?Unable to independently afford treatment regimen ?Unable to maintain control of T2DM ?Suboptimal therapeutic regimen for T2DM ? ?Pharmacist Clinical Goal(s):  ?patient will verbalize ability to afford treatment regimen ?achieve control of T2DM as evidenced by GOAL A1C<7% ?adhere to plan to optimize therapeutic regimen for T2DM as evidenced by report of adherence to recommended medication management changes through collaboration with PharmD and provider.  ? ?Interventions: ?1:1 collaboration with Dettinger, Fransisca Kaufmann, MD regarding development and update of comprehensive plan of care as evidenced by provider attestation and co-signature ?Inter-disciplinary care team collaboration (see longitudinal plan of care) ?Comprehensive medication review performed; medication list updated in electronic medical record ? ?Diabetes: Goal on Track (progressing): YES. ?Uncontrolled-A1C 7.9%, GFR 37-->30;  ?Current treatment: meds were initially chosen prior to pharmd appt due to cost/patient comfort; we are working to optimize them ?INCREASE TRULICITY TO '3MG'$  SQ WEEKLY  ?Denies personal and family history of Medullary thyroid cancer (MTC) ?Continue Basaglar 25 units once daily (patient has been taking twice daily) ?START FARXIGA '10MG'$  DAILY-gfr in the 30s (sees nephro) ?WILL ENROLL IN AZ&ME PATIENT ASSISTANCE PROGRAM ?DECREASE Humulin R to 20 units twice daily with meals ?  ?Patient enrolled in PAP with [name] [facility] [place] [facility]UL[place], [place] [facility] PER PROGRAM; ships 54-monthsupplies to patient's home every quarter(due to back orders unlikely to ship more than 1 month at a time) ?Current glucose readings: fasting glucose: <200, post prandial glucose: 200s ?Denies hypoglycemic/hyperglycemic symptoms ?Discussed meal planning options and Plate method for healthy eating ?Avoid sugary drinks and  desserts ?Incorporate balanced protein, non starchy veggies, 1 serving of carbohydrate with each meal ?Increase water intake ?Increase physical activity as able ?Current exercise: patient unable due to physical condition ?Recommended increase glp1; work to optimize insulin regimen and POs for T2DM ?Assessed patient finances. Re enrollment and dose changes sent to [employer] patient assistance program--will follow ? ?Patient Goals/Self-Care Activities ?patient will:  ?- take medications as prescribed as evidenced by patient report and record review ?check glucose daily (3 times) or when symptomatic, document, and provide at future appointments ?collaborate with provider on medication access solutions ?engage in dietary modifications by FOLLOWING A HEART HEALTHY DIET/HEALTHY PLATE METHOD ? ? ? ?Plan: Telephone follow up appointment with care management team member scheduled for:  3  months ? ?Signature ?JRegina Eck PharmD, BCPS ?Clinical Pharmacist, [facility] ?[place] Phone [phone]? ? ?Please call the care guide team at [phone] you need to cancel or reschedule your appointment.  ? ?The patient verbalized understanding of instructions, educational materials, and care plan provided today and declined offer to receive copy of patient instructions, educational materials, and care plan.  ? ?

## 2021-11-11 ENCOUNTER — Telehealth: Payer: Self-pay | Admitting: *Deleted

## 2021-11-11 ENCOUNTER — Encounter: Payer: Self-pay | Admitting: *Deleted

## 2021-11-11 ENCOUNTER — Encounter: Payer: Self-pay | Admitting: Neurology

## 2021-11-11 DIAGNOSIS — L821 Other seborrheic keratosis: Secondary | ICD-10-CM | POA: Diagnosis not present

## 2021-11-11 DIAGNOSIS — Z85828 Personal history of other malignant neoplasm of skin: Secondary | ICD-10-CM | POA: Diagnosis not present

## 2021-11-11 DIAGNOSIS — Z08 Encounter for follow-up examination after completed treatment for malignant neoplasm: Secondary | ICD-10-CM | POA: Diagnosis not present

## 2021-11-11 DIAGNOSIS — D225 Melanocytic nevi of trunk: Secondary | ICD-10-CM | POA: Diagnosis not present

## 2021-11-11 DIAGNOSIS — L218 Other seborrheic dermatitis: Secondary | ICD-10-CM | POA: Diagnosis not present

## 2021-11-11 DIAGNOSIS — L538 Other specified erythematous conditions: Secondary | ICD-10-CM | POA: Diagnosis not present

## 2021-11-11 DIAGNOSIS — L570 Actinic keratosis: Secondary | ICD-10-CM | POA: Diagnosis not present

## 2021-11-11 DIAGNOSIS — D485 Neoplasm of uncertain behavior of skin: Secondary | ICD-10-CM | POA: Diagnosis not present

## 2021-11-11 NOTE — Telephone Encounter (Signed)
I called pt. I advised pt that Dr. Brett Fairy reviewed their sleep study results and found that pt has sleep apnea. Dr. Brett Fairy recommends that pt get set up with new cpap and continue cpap therapy for treatment. I reviewed PAP compliance expectations with the pt. Pt is agreeable to starting a CPAP. I advised pt that an order will be sent to a DME, [facility], and [facility] will call the pt within about one week after they file with the pt's insurance. [facility] will show the pt how to use the machine, fit for masks, and troubleshoot the CPAP if needed. A follow up appt was made for insurance purposes with AL,NP on 01/20/22 at 1:30pm. Pt verbalized understanding to arrive 15 minutes early and bring their CPAP. A letter with all of this information in it will be mailed to the pt as a reminder. I verified with the pt that the address we have on file is correct. Pt verbalized understanding of results. Pt had no questions at this time but was encouraged to call back if questions arise. I have sent the order to [employer] and have received confirmation that they have received the order. ? ?

## 2021-11-11 NOTE — Telephone Encounter (Signed)
-----   Message from Larey Seat, MD sent at 11/05/2021  5:14 PM EST ----- ?This HST confirms the presence of severe sleep apnea and yes this patient will continue to need positive airway pressure therapy to overcome apnea and hypoxia.  It would also be beneficial if he could avoid sleeping in supine position which would require that he has a mask allowing him to sleep on his side. ?I see no evidence of dominant central sleep apnea, but a 26% Cheyne-Stokes respiration percentage is listed in his events. ?42.2/h and in rem sleep more than double as high as in non-REM sleep.  REM AHI was 72.5 and non-REM AHI was 33.1/h.  O2 saturation in minutes under 90%; 48.4 minutes the equivalent of 8.4% of total sleep time?. ? ??? ??                    Therefore his apnea is still complex predominantly obstructive and severe. ?Unfortunately, I did not have the settings of his current CPAP machine available.  I considered the patient's CPAP dependent. ?? ?? ?RECOMMENDATION: ?Auto titration CPAP device with a setting between 6 and 16 cm water pressure, 2 cm EPR, heated humidification and mask of patient's choice.  He is using currently a nasal pillow I would like him to continue the use of a nasal pillow.  I will insist on a ResMed machine with heated humidification. ?? ?

## 2021-11-19 ENCOUNTER — Other Ambulatory Visit: Payer: Self-pay | Admitting: *Deleted

## 2021-11-19 DIAGNOSIS — G4733 Obstructive sleep apnea (adult) (pediatric): Secondary | ICD-10-CM | POA: Diagnosis not present

## 2021-11-19 NOTE — Patient Outreach (Signed)
[facility]) Care Management ? ?11/19/2021 ? ?Manuel Kline ?August 10, 1932 ?[id number] ? ?RN Health Coach telephone call to patient.  [employer] compliance verified. RNspoke with wife. Patient went to get new CPAP and asked to call back later. ? ?Johny Shock BSN RN ?[place] Management ?[phone] ? ? ? ?

## 2021-11-26 ENCOUNTER — Other Ambulatory Visit: Payer: Self-pay | Admitting: *Deleted

## 2021-11-26 NOTE — Patient Outreach (Signed)
[facility]) Care Management ?RN Health Coach Note ? ? ?11/26/2021 ?Name:  Manuel Kline MRN:  [id number] DOB:  1932-05-17 ? ?Summary: ?Patient had not monitored blood sugar today. Per patient his fasting blood sugars '@150'$ .  Appetite is good. Patient wife [name] monitors patient diet and prepares the food.  ?Patient does not exercise. No recent falls.  Patient has a new CPAP machine. Patient has ordered diabetic shoes.  ? ?Recommendations/Changes made from today's visit: ?Monitor Blood sugar daily as per ordered ?Medication adherence ?Follow up on diabetic shoes ?Clean CPAP regularly ? ? ?Subjective: ?Manuel Kline is an 88 y.o. year old male who is a primary patient of Dettinger, Fransisca Kaufmann, MD. The care management team was consulted for assistance with care management and/or care coordination needs.   ? ?RN Health Coach completed Telephone Visit today.  ? ?Objective: ? ?Medications Reviewed Today   ? ? Reviewed by Gardiner Barefoot, DPM (Physician) on 10/29/21 at 45  Med List Status: <None>  ? ?Medication Order Taking? Sig Documenting Provider Last Dose Status Informant  ?benazepril (LOTENSIN) 40 MG tablet [id number] No Take 1 tablet (40 mg total) by mouth daily. Dettinger, Fransisca Kaufmann, MD Taking Active   ?cloNIDine (CATAPRES) 0.1 MG tablet [id number] No Take 1 tablet by mouth twice daily  ?Patient taking differently: Take 0.1 mg by mouth 2 (two) times daily.  ? Dettinger, Fransisca Kaufmann, MD Taking Active   ?clopidogrel (PLAVIX) 75 MG tablet [id number] No Take 1 tablet (75 mg total) by mouth daily. Dettinger, Fransisca Kaufmann, MD Taking Active   ?Cyanocobalamin (VITAMIN B 12 PO) [id number] No Take 1,000 mcg by mouth daily. [provider] Taking Active Spouse/Significant Other  ?dapagliflozin propanediol (FARXIGA) 10 MG TABS tablet [id number] No Take 1 tablet (10 mg total) by mouth daily before breakfast. Dettinger, Fransisca Kaufmann, MD Taking Active   ?Dulaglutide (TRULICITY) 3 PR/9.1MB SOPN [id number] No Inject 3 mg as  directed once a week. [name] patient assistance program Dettinger, Fransisca Kaufmann, MD Taking Active   ?         ?Med Note Lavera Guise   Sun Oct 05, 2021 11:21 PM) [place] patient assistance program ? ?  ?DULoxetine (CYMBALTA) 30 MG capsule [id number] No Take 1 capsule (30 mg total) by mouth daily. Dettinger, Fransisca Kaufmann, MD Taking Active   ?furosemide (LASIX) 40 MG tablet [id number] No Take 1 tablet (40 mg total) by mouth 2 (two) times daily. Dettinger, Fransisca Kaufmann, MD Taking Active   ?gabapentin (NEURONTIN) 300 MG capsule [id number] No Take 1 capsule (300 mg total) by mouth 2 (two) times daily. Dettinger, Fransisca Kaufmann, MD Taking Active   ?Insulin Glargine (BASAGLAR KWIKPEN) 100 UNIT/ML SOPN [id number] No Inject 0.25 mLs (25 Units total) into the skin daily.  ?Patient taking differently: Inject 32 Units into the skin daily.  ? Dettinger, Fransisca Kaufmann, MD Taking Active   ?         ?Med Note Lavera Guise   Sun Oct 05, 2021 11:21 PM) [place] patient assistance program ? ?  ?insulin regular (HUMULIN R) 100 units/mL injection [id number] No Inject 0.3 mLs (30 Units total) into the skin 2 (two) times daily before a meal.  ?Patient taking differently: Inject 25 Units into the skin 2 (two) times daily before a meal.  ? Dettinger, Fransisca Kaufmann, MD Taking Active   ?         ?Med Note Blanca Friend, Royce Macadamia   Sun Oct 05, 2021  11:21 PM) [place] patient assistance program ? ?  ?metoprolol succinate (TOPROL-XL) 50 MG 24 hr tablet [id number] No Take 1 tablet (50 mg total) by mouth daily. Take with or immediately following a meal. Dettinger, Fransisca Kaufmann, MD Taking Active   ?nitroGLYCERIN (NITROSTAT) 0.4 MG SL tablet [id number] No Place 1 tablet (0.4 mg total) under the tongue every 5 (five) minutes as needed for chest pain. Dettinger, Fransisca Kaufmann, MD Taking Active   ?Omega-3 Fatty Acids (FISH OIL) 1000 MG CPDR [id number] No Take 1 tablet by mouth daily. [provider] Taking Active Spouse/Significant Other  ?polyethylene glycol  powder (GLYCOLAX/MIRALAX) powder [id number] No Take 17 g by mouth 2 (two) times daily as needed.  ?Patient taking differently: Take 17 g by mouth 2 (two) times daily as needed for mild constipation or moderate constipation.  ? Eustaquio Maize, MD Taking Active   ?rosuvastatin (CRESTOR) 5 MG tablet [id number] No Take 1 tablet (5 mg total) by mouth daily. Dettinger, Fransisca Kaufmann, MD Taking Active   ? ?  ?  ? ?  ? ? ? ?SDOH:  (Social Determinants of Health) assessments and interventions performed:  ?SDOH Interventions   ? ?Flowsheet Row Most Recent Value  ?SDOH Interventions   ?Food Insecurity Interventions Intervention Not Indicated  ?Housing Interventions Intervention Not Indicated  ?Transportation Interventions Intervention Not Indicated  ? ?  ? ? ?Care Plan ? ?Review of patient past medical history, allergies, medications, health status, including review of consultants reports, laboratory and other test data, was performed as part of comprehensive evaluation for care management services.  ? ?Care Plan : RN Care Manager Plan of Care  ?Updates made by Hilding Quintanar, Eppie Gibson, RN since 11/26/2021 12:00 AM  ?  ? ?Problem: Knowledge Deficit Related to Diabetes and Care Coordination Needs   ?Priority: High  ?  ? ?Long-Range Goal: Development Plan of Care for Management of Diabetes   ?Start Date: 08/27/2021  ?Expected End Date: 08/29/2022  ?Priority: High  ?Note:   ?Current Barriers:  ?Knowledge Deficits related to plan of care for management of DMII  ? ?RNCM Clinical Goal(s):  ?Patient will verbalize understanding of plan for management of DMII as evidenced by continuation of monitoring blood sugars and adhering to diabetic diet and low cholesterol diet  through collaboration with RN Care manager, provider, and care team.  ? ?Interventions: ?Inter-disciplinary care team collaboration (see longitudinal plan of care) ?Evaluation of current treatment plan related to  self management and patient's adherence to plan as established by  provider ? ? ?Patient Goals/Self-Care Activities: ?Take medications as prescribed   ?Attend all scheduled provider appointments ?Call pharmacy for medication refills 3-7 days in advance of running out of medications ?Attend church or other social activities ?Perform all self care activities independently  ?Call provider office for new concerns or questions  ?schedule appointment with eye doctor ?check blood sugar at prescribed times: once daily ?check feet daily for cuts, sores or redness ?take the blood sugar meter to all doctor visits ?trim toenails straight across ?drink 6 to 8 glasses of water each day ?manage portion size ?Bake foods and cook with olive oil when possible ?  ?[id number] Per patient he had not checked his blood sugar today. Per patient his fasting usually averages around 150.  Patient appetite is good. His wife cooks and monitors his diet. Patient has received his new CPAP machine.  ?  ?  ? ?Plan: Telephone follow up appointment with care management team member scheduled for:  February 03, 2022 ?The patient has been provided with contact information for the care management team and has been advised to call with any health related questions or concerns.  ? ?Johny Shock BSN RN ?[place] Management ?[phone] ? ? ? ?  ?

## 2021-11-26 NOTE — Patient Instructions (Signed)
Visit Information ? ?Thank you for taking time to visit with me today. Please don't hesitate to contact me if I can be of assistance to you before our next scheduled telephone appointment. ? ?Following are the goals we discussed today:  ?Current Barriers:  ?Knowledge Deficits related to plan of care for management of DMII  ? ?RNCM Clinical Goal(s):  ?Patient will verbalize understanding of plan for management of DMII as evidenced by continuation of monitoring blood sugars and adhering to diabetic diet and low cholesterol diet through collaboration with RN Care manager, provider, and care team.  ? ?Interventions: ?Inter-disciplinary care team collaboration (see longitudinal plan of care) ?Evaluation of current treatment plan related to  self management and patient's adherence to plan as established by provider ? ? ?Patient Goals/Self-Care Activities: ?Take medications as prescribed   ?Attend all scheduled provider appointments ?Call pharmacy for medication refills 3-7 days in advance of running out of medications ?Attend church or other social activities ?Perform all self care activities independently  ?Call provider office for new concerns or questions  ?schedule appointment with eye doctor ?check blood sugar at prescribed times: once daily ?check feet daily for cuts, sores or redness ?take the blood sugar meter to all doctor visits ?trim toenails straight across ?drink 6 to 8 glasses of water each day ?manage portion size ?Bake foods and cook with olive oil when possible ?  ?[id number] Per patient he had not checked his blood sugar today. Per patient his fasting usually averages around 150.  Patient appetite is good. His wife cooks and monitors his diet. Patient has received his new CPAP machine.  ? ?Our next appointment is by telephone on February 03, 2022 ? ?Please call Johny Shock RN [phone] if you need to cancel or reschedule your appointment.  ? ?Please call the Suicide and Crisis Lifeline: 988 if you are  experiencing a Mental Health or [place] or need someone to talk to. ? ?The patient verbalized understanding of instructions, educational materials, and care plan provided today and agreed to receive a mailed copy of patient instructions, educational materials, and care plan.  ? ?Telephone follow up appointment with care management team member scheduled for: ?The patient has been provided with contact information for the care management team and has been advised to call with any health related questions or concerns.  ? ?SIGNATURE ? ?Johny Shock BSN RN ?[place] Management ?[phone] ? ? ?  ?

## 2021-11-28 ENCOUNTER — Telehealth: Payer: Self-pay | Admitting: Family Medicine

## 2021-11-28 ENCOUNTER — Telehealth: Payer: Self-pay

## 2021-11-28 ENCOUNTER — Other Ambulatory Visit: Payer: Self-pay | Admitting: Family Medicine

## 2021-11-28 DIAGNOSIS — G629 Polyneuropathy, unspecified: Secondary | ICD-10-CM

## 2021-11-28 NOTE — Telephone Encounter (Signed)
Samples up front - pt aware  ?

## 2021-11-28 NOTE — Telephone Encounter (Signed)
Shoes ordered - [employer] [facility] [place] - [place] ?

## 2021-11-28 NOTE — Telephone Encounter (Signed)
Patient is almost out of dapagliflozin propanediol (FARXIGA) 10 MG TABS tablet samples and would like to know if we have any at this time. Please call back.  ?

## 2021-12-01 ENCOUNTER — Ambulatory Visit: Payer: Medicare Other | Admitting: *Deleted

## 2021-12-07 ENCOUNTER — Other Ambulatory Visit: Payer: Self-pay | Admitting: Family Medicine

## 2021-12-07 DIAGNOSIS — G629 Polyneuropathy, unspecified: Secondary | ICD-10-CM

## 2021-12-07 DIAGNOSIS — Z8739 Personal history of other diseases of the musculoskeletal system and connective tissue: Secondary | ICD-10-CM

## 2021-12-07 DIAGNOSIS — Z8673 Personal history of transient ischemic attack (TIA), and cerebral infarction without residual deficits: Secondary | ICD-10-CM

## 2021-12-07 DIAGNOSIS — I5022 Chronic systolic (congestive) heart failure: Secondary | ICD-10-CM

## 2021-12-09 NOTE — Patient Instructions (Signed)
Manuel Kline , ?Thank you for taking time to come for your Medicare Wellness Visit. I appreciate your ongoing commitment to your health goals. Please review the following plan we discussed and let me know if I can assist you in the future.  ? ?Screening recommendations/referrals: ?Colonoscopy: No longer required ?Recommended yearly ophthalmology/optometry visit for glaucoma screening and checkup ?Recommended yearly dental visit for hygiene and checkup ? ?Vaccinations: ?Influenza vaccine: Done 06/16/2021 - Repeat annually ?Pneumococcal vaccine: Done 03/01/2015, 03/16/2016, & 12/02/2020 ?Tdap vaccine: Done 02/16/2020 - Repeat in 10 years ?Shingles vaccine: Done   02/17/2020 & 09/17/2020 ?Covid-19: Done 09/12/2019, 10/13/2019, & 07/04/2020 ? ?Advanced directives: Advance directive discussed with you today. I have provided a copy for you to complete at home and have notarized. Once this is complete please bring a copy in to our office so we can scan it into your chart.  ? ?Conditions/risks identified: Aim for 30 minutes of exercise or walking, 6-8 glasses of water, and 5 servings of fruits and vegetables each day.  ? ?Next appointment: Follow up in one year for your annual wellness visit.  ? ?Preventive Care 86 Years and Older, Male ? ?Preventive care refers to lifestyle choices and visits with your health care provider that can promote health and wellness. ?What does preventive care include? ?A yearly physical exam. This is also called an annual well check. ?Dental exams once or twice a year. ?Routine eye exams. Ask your health care provider how often you should have your eyes checked. ?Personal lifestyle choices, including: ?Daily care of your teeth and gums. ?Regular physical activity. ?Eating a healthy diet. ?Avoiding tobacco and drug use. ?Limiting alcohol use. ?Practicing safe sex. ?Taking low doses of aspirin every day. ?Taking vitamin and mineral supplements as recommended by your health care provider. ?What happens during an  annual well check? ?The services and screenings done by your health care provider during your annual well check will depend on your age, overall health, lifestyle risk factors, and family history of disease. ?Counseling  ?Your health care provider may ask you questions about your: ?Alcohol use. ?Tobacco use. ?Drug use. ?Emotional well-being. ?Home and relationship well-being. ?Sexual activity. ?Eating habits. ?History of falls. ?Memory and ability to understand (cognition). ?Work and work Statistician. ?Screening  ?You may have the following tests or measurements: ?Height, weight, and BMI. ?Blood pressure. ?Lipid and cholesterol levels. These may be checked every 5 years, or more frequently if you are over 80 years old. ?Skin check. ?Lung cancer screening. You may have this screening every year starting at age 47 if you have a 30-pack-year history of smoking and currently smoke or have quit within the past 15 years. ?Fecal occult blood test (FOBT) of the stool. You may have this test every year starting at age 76. ?Flexible sigmoidoscopy or colonoscopy. You may have a sigmoidoscopy every 5 years or a colonoscopy every 10 years starting at age 86. ?Prostate cancer screening. Recommendations will vary depending on your family history and other risks. ?Hepatitis C blood test. ?Hepatitis B blood test. ?Sexually transmitted disease (STD) testing. ?Diabetes screening. This is done by checking your blood sugar (glucose) after you have not eaten for a while (fasting). You may have this done every 1-3 years. ?Abdominal aortic aneurysm (AAA) screening. You may need this if you are a current or former smoker. ?Osteoporosis. You may be screened starting at age 86 if you are at high risk. ?Talk with your health care provider about your test results, treatment options, and if necessary,  the need for more tests. ?Vaccines  ?Your health care provider may recommend certain vaccines, such as: ?Influenza vaccine. This is recommended  every year. ?Tetanus, diphtheria, and acellular pertussis (Tdap, Td) vaccine. You may need a Td booster every 10 years. ?Zoster vaccine. You may need this after age 67. ?Pneumococcal 13-valent conjugate (PCV13) vaccine. One dose is recommended after age 86. ?Pneumococcal polysaccharide (PPSV23) vaccine. One dose is recommended after age 86. ?Talk to your health care provider about which screenings and vaccines you need and how often you need them. ?This information is not intended to replace advice given to you by your health care provider. Make sure you discuss any questions you have with your health care provider. ?Document Released: 09/13/2015 Document Revised: 05/06/2016 Document Reviewed: 06/18/2015 ?Elsevier Interactive Patient Education ? 2017 [place]. ? ?Fall Prevention in the Home ?Falls can cause injuries. They can happen to people of all ages. There are many things you can do to make your home safe and to help prevent falls. ?What can I do on the outside of my home? ?Regularly fix the edges of walkways and driveways and fix any cracks. ?Remove anything that might make you trip as you walk through a door, such as a raised step or threshold. ?Trim any bushes or trees on the path to your home. ?Use bright outdoor lighting. ?Clear any walking paths of anything that might make someone trip, such as rocks or tools. ?Regularly check to see if handrails are loose or broken. Make sure that both sides of any steps have handrails. ?Any raised decks and porches should have guardrails on the edges. ?Have any leaves, snow, or ice cleared regularly. ?Use sand or salt on walking paths during winter. ?Clean up any spills in your garage right away. This includes oil or grease spills. ?What can I do in the bathroom? ?Use night lights. ?Install grab bars by the toilet and in the tub and shower. Do not use towel bars as grab bars. ?Use non-skid mats or decals in the tub or shower. ?If you need to sit down in the shower,  use a plastic, non-slip stool. ?Keep the floor dry. Clean up any water that spills on the floor as soon as it happens. ?Remove soap buildup in the tub or shower regularly. ?Attach bath mats securely with double-sided non-slip rug tape. ?Do not have throw rugs and other things on the floor that can make you trip. ?What can I do in the bedroom? ?Use night lights. ?Make sure that you have a light by your bed that is easy to reach. ?Do not use any sheets or blankets that are too big for your bed. They should not hang down onto the floor. ?Have a firm chair that has side arms. You can use this for support while you get dressed. ?Do not have throw rugs and other things on the floor that can make you trip. ?What can I do in the kitchen? ?Clean up any spills right away. ?Avoid walking on wet floors. ?Keep items that you use a lot in easy-to-reach places. ?If you need to reach something above you, use a strong step stool that has a grab bar. ?Keep electrical cords out of the way. ?Do not use floor polish or wax that makes floors slippery. If you must use wax, use non-skid floor wax. ?Do not have throw rugs and other things on the floor that can make you trip. ?What can I do with my stairs? ?Do not leave any items on  the stairs. ?Make sure that there are handrails on both sides of the stairs and use them. Fix handrails that are broken or loose. Make sure that handrails are as long as the stairways. ?Check any carpeting to make sure that it is firmly attached to the stairs. Fix any carpet that is loose or worn. ?Avoid having throw rugs at the top or bottom of the stairs. If you do have throw rugs, attach them to the floor with carpet tape. ?Make sure that you have a light switch at the top of the stairs and the bottom of the stairs. If you do not have them, ask someone to add them for you. ?What else can I do to help prevent falls? ?Wear shoes that: ?Do not have high heels. ?Have rubber bottoms. ?Are comfortable and fit you  well. ?Are closed at the toe. Do not wear sandals. ?If you use a stepladder: ?Make sure that it is fully opened. Do not climb a closed stepladder. ?Make sure that both sides of the stepladder are locked

## 2021-12-10 ENCOUNTER — Telehealth: Payer: Self-pay

## 2021-12-10 ENCOUNTER — Ambulatory Visit (INDEPENDENT_AMBULATORY_CARE_PROVIDER_SITE_OTHER): Payer: Medicare Other

## 2021-12-10 VITALS — Wt 224.0 lb

## 2021-12-10 DIAGNOSIS — Z0000 Encounter for general adult medical examination without abnormal findings: Secondary | ICD-10-CM

## 2021-12-10 NOTE — Telephone Encounter (Signed)
Please check on patient assistance for Humulin-R ?

## 2021-12-10 NOTE — Progress Notes (Signed)
? ?Subjective:  ? Manuel Kline is a 88 y.o. male who presents for Medicare Annual/Subsequent preventive examination. ? ?Virtual Visit via Telephone Note ? ?I connected with  Mirian Capuchin Comp on 12/10/21 at  9:00 AM EDT by telephone and verified that I am speaking with the correct person using two identifiers. ? ?Location: ?Patient: Home ?Provider: [facility] ?Persons participating in the virtual visit: patient/Nurse Health Advisor ?  ?I discussed the limitations, risks, security and privacy concerns of performing an evaluation and management service by telephone and the availability of in person appointments. The patient expressed understanding and agreed to proceed. ? ?Interactive audio and video telecommunications were attempted between this nurse and patient, however failed, due to patient having technical difficulties OR patient did not have access to video capability.  We continued and completed visit with audio only. ? ?Some vital signs may be absent or patient reported.  ? ?Afra Tricarico Dionne Ano, LPN  ? ?Review of Systems    ? ?Cardiac Risk Factors include: advanced age (>45mn, >>32women);obesity (BMI >30kg/m2);sedentary lifestyle;male gender;dyslipidemia;hypertension;diabetes mellitus;Other (see comment), Risk factor comments: CHF, CAD, OSA on CPAP, S/P CABG, hx TIA, PVD, presence of difibrillator ? ?   ?Objective:  ?  ?Today's Vitals  ? 12/10/21 0901  ?Weight: 224 lb (101.6 kg)  ? ?Body mass index is 37.28 kg/m?. ? ? ?  12/10/2021  ?  9:24 AM 12/09/2020  ?  9:04 AM 10/24/2019  ? 11:25 AM 04/20/2019  ?  9:55 AM 03/27/2019  ? 11:03 AM 12/29/2018  ?  1:11 PM 12/25/2018  ?  2:14 PM  ?Advanced Directives  ?Does Patient Have a Medical Advance Directive? No Yes Yes Yes Yes Yes Yes  ?Type of AScientist, physiologicalof AChristineLiving will HCreolaLiving will Living will;Healthcare Power of ABadinLiving will   ?Does patient want to make changes to medical advance directive?   Yes (MAU/Ambulatory/Procedural Areas - Information given) No - Patient declined No - Patient declined No - Patient declined    ?Copy of HNotusin Chart?     No - copy requested No - copy requested   ?Would patient like information on creating a medical advance directive? Yes (MAU/Ambulatory/Procedural Areas - Information given)        ? ? ?Current Medications (verified) ?Outpatient Encounter Medications as of 12/10/2021  ?Medication Sig  ? ACCU-CHEK GUIDE test strip USE 1 STRIP TO CHECK GLUCOSE FIVE TIMES DAILY  ? allopurinol (ZYLOPRIM) 300 MG tablet Take 150 mg by mouth daily.  ? benazepril (LOTENSIN) 40 MG tablet Take 1 tablet (40 mg total) by mouth daily.  ? cloNIDine (CATAPRES) 0.1 MG tablet Take 1 tablet by mouth twice daily  ? clopidogrel (PLAVIX) 75 MG tablet Take 1 tablet by mouth once daily  ? Cyanocobalamin (VITAMIN B 12 PO) Take 1,000 mcg by mouth daily.  ? dapagliflozin propanediol (FARXIGA) 10 MG TABS tablet Take 1 tablet (10 mg total) by mouth daily before breakfast.  ? Dulaglutide (TRULICITY) 3 MVQ/9.4HWSOPN Inject 3 mg as directed once a week. [name] [facility] patient assistance program  ? DULoxetine (CYMBALTA) 30 MG capsule Take 1 capsule by mouth once daily  ? fluticasone (CUTIVATE) 0.05 % cream Apply topically.  ? furosemide (LASIX) 40 MG tablet Take 1 tablet by mouth twice daily  ? gabapentin (NEURONTIN) 300 MG capsule Take 1 capsule by mouth twice daily  ? Insulin Glargine (BASAGLAR KWIKPEN) 100 UNIT/ML SOPN Inject 0.25 mLs (  25 Units total) into the skin daily.  ? metoprolol succinate (TOPROL-XL) 50 MG 24 hr tablet TAKE 1 TABLET BY MOUTH ONCE DAILY TAKE  WITH  OR  IMMEDIATELY  FOLLOWING  A  MEAL  ? nitroGLYCERIN (NITROSTAT) 0.4 MG SL tablet Place 1 tablet (0.4 mg total) under the tongue every 5 (five) minutes as needed for chest pain.  ? Omega-3 Fatty Acids (FISH OIL) 1000 MG CPDR Take 1 tablet by mouth daily.  ? polyethylene glycol powder (GLYCOLAX/MIRALAX) powder Take 17 g by  mouth 2 (two) times daily as needed. (Patient taking differently: Take 17 g by mouth 2 (two) times daily as needed for mild constipation or moderate constipation.)  ? rosuvastatin (CRESTOR) 5 MG tablet Take 1 tablet (5 mg total) by mouth daily.  ? insulin regular (HUMULIN R) 100 units/mL injection Inject 0.3 mLs (30 Units total) into the skin 2 (two) times daily before a meal. (Patient not taking: Reported on 12/10/2021)  ? [DISCONTINUED] aspirin 81 MG EC tablet Take by mouth. (Patient not taking: Reported on 12/10/2021)  ? [DISCONTINUED] insulin aspart (NOVOLOG) 100 unit/mL injection Inject into the skin. (Patient not taking: Reported on 12/10/2021)  ? ?No facility-administered encounter medications on file as of 12/10/2021.  ? ? ?Allergies (verified) ?Patient has no known allergies.  ? ?History: ?Past Medical History:  ?Diagnosis Date  ? Adenomatous colon polyp 2006  ? CAD (coronary artery disease)   ? Calcium oxalate renal stones   ? Cardiomyopathy   ? Cataract   ? Cataract   ? Cerebral embolism with cerebral infarction 12/26/2018  ? Chronic systolic heart failure ([place])   ?     ? CKD (chronic kidney disease) stage 4, GFR 15-29 ml/min (HCC) 11/08/2017  ? Claudication in peripheral vascular disease ([place]) 07/23/2020  ? Diabetes ([place])   ? Diabetic peripheral neuropathy ([place]) 01/31/2015  ? Dual implantable cardioverter-defibrillator in situ   ? 01/16/2002 Dr. Lovena Le  RA lead  Guidant [phone] RV lead  Guidant [phone] Generator  Guidant Prism  09/02/2009 Generator change Medtronic [id number]  SN  [id number]     ? Erectile dysfunction   ? Erosive esophagitis   ? Essential hypertension 11/30/2015  ? Hemorrhoids   ? History of TIA (transient ischemic attack) 01/19/2019  ? HLD (hyperlipidemia)   ? HTN (hypertension)   ? Hyperlipidemia   ? Hypertensive heart disease without CHF 07/31/2011  ? ICD (implantable cardiac defibrillator) in place   ? ICD dual chamber in situ   ? Ischemic cardiomyopathy   ? EF 40% June 2013   ? Left-sided  weakness 12/25/2018  ? Metabolic syndrome   ? Moderate nonproliferative diabetic retinopathy of both eyes without macular edema associated with type 2 diabetes mellitus ([place]) 02/20/2020  ? Morbid obesity ([place])   ? OSA on CPAP 11/05/2014  ? Osteoarthritis   ? Posterior vitreous detachment of both eyes 02/20/2020  ? Presence of automatic (implantable) cardiac defibrillator 11/08/2017  ? S/P CABG (coronary artery bypass graft) 11/02/2000  ? Sleep apnea   ? Stroke-like symptoms 12/26/2018  ? Syncope 08/06/2009  ? Qualifier: Diagnosis of  By: Selena Batten CMA, [place]    ? Type 2 diabetes, uncontrolled, with neuropathy   ? Has retinopahty and neuropathy    ? Ventricular tachycardia (paroxysmal) ([place]) 07/14/2016  ? ?Past Surgical History:  ?Procedure Laterality Date  ? ABDOMINAL EXPLORATION SURGERY    ? BACK SURGERY    ? X'3  ? cardiac bypass    ? CARDIAC DEFIBRILLATOR PLACEMENT    ?  CARPAL TUNNEL RELEASE    ? X2, bilateral  ? CATARACT EXTRACTION    ? COLONOSCOPY  06/20/2012  ? Procedure: COLONOSCOPY;  Surgeon: Sable Feil, MD;  Location: [facility];  Service: Endoscopy;  Laterality: N/A;  ? DOPPLER ECHOCARDIOGRAPHY  2003  ? EP IMPLANTABLE DEVICE N/A 07/14/2016  ? Procedure: ICD Generator Changeout;  Surgeon: Evans Lance, MD;  Location: [facility];  Service: Cardiovascular;  Laterality: N/A;  ? ESOPHAGOGASTRODUODENOSCOPY  06/20/2012  ? Procedure: ESOPHAGOGASTRODUODENOSCOPY (EGD);  Surgeon: Sable Feil, MD;  Location: [facility];  Service: Endoscopy;  Laterality: N/A;  ? EYE SURGERY    ? LAPAROTOMY    ? RETINOPATHY SURGERY Bilateral   ? rotator cuff surgery    ? left  ? ?Family History  ?Problem Relation Age of Onset  ? Diabetes Brother   ? Heart disease Father   ? Heart disease Mother   ? Diabetes Mother   ? Diabetes Brother   ? Diabetes Brother   ? Cancer Brother   ?     baldder  ? Heart disease Brother   ? Diabetes Sister   ? Diabetes Sister   ? Kidney disease Sister   ?     dialysis  ? Diabetes Sister   ?  Diabetes Sister   ? Diabetes Sister   ? Throat cancer Paternal Uncle   ? ?Social History  ? ?Socioeconomic History  ? Marital status: Married  ?  Spouse name: [name]  ? Number of children: 4  ? Years of educat

## 2021-12-11 NOTE — Telephone Encounter (Signed)
Please let patient know his Humulin R prescription is processing as a priority -- he can call [facility] if he has further questions [phone]) ?We have no control over processing/shipping times--his patience is appreciated ?His basaglar and trulicity were already shipped & he should've received ?

## 2021-12-11 NOTE — Telephone Encounter (Signed)
Wife has been made aware and understood. She was very thankful. They have [employer] number and will call if needed. ?

## 2021-12-20 DIAGNOSIS — G4733 Obstructive sleep apnea (adult) (pediatric): Secondary | ICD-10-CM | POA: Diagnosis not present

## 2021-12-23 ENCOUNTER — Encounter: Payer: Self-pay | Admitting: Family Medicine

## 2021-12-31 ENCOUNTER — Ambulatory Visit (INDEPENDENT_AMBULATORY_CARE_PROVIDER_SITE_OTHER): Payer: Medicare Other | Admitting: Ophthalmology

## 2021-12-31 ENCOUNTER — Encounter (INDEPENDENT_AMBULATORY_CARE_PROVIDER_SITE_OTHER): Payer: Self-pay | Admitting: Ophthalmology

## 2021-12-31 ENCOUNTER — Ambulatory Visit: Payer: Medicare Other | Admitting: Family Medicine

## 2021-12-31 DIAGNOSIS — G4733 Obstructive sleep apnea (adult) (pediatric): Secondary | ICD-10-CM

## 2021-12-31 DIAGNOSIS — Z9989 Dependence on other enabling machines and devices: Secondary | ICD-10-CM

## 2021-12-31 DIAGNOSIS — E113393 Type 2 diabetes mellitus with moderate nonproliferative diabetic retinopathy without macular edema, bilateral: Secondary | ICD-10-CM

## 2021-12-31 NOTE — Assessment & Plan Note (Signed)
The nature of moderate nonproliferative diabetic retinopathy was discussed with the patient as well as the need for more frequent follow up to judge for progression. Good blood glucose, blood pressure, and serum lipid control was recommended as well as avoidance of smoking and maintenance of normal weight.  Close follow up with PCP encouraged. °

## 2021-12-31 NOTE — Assessment & Plan Note (Signed)
Compliant on CPAP ?

## 2021-12-31 NOTE — Progress Notes (Signed)
? ? ?12/31/2021 ? ?  ? ?CHIEF COMPLAINT ?Patient presents for  ?Chief Complaint  ?Patient presents with  ? Diabetic Retinopathy without Macular Edema  ? ? ? ? ?HISTORY OF PRESENT ILLNESS: ?Manuel Kline is a 88 y.o. male who presents to the clinic today for:  ? ?HPI   ?9 mos fu OU oct fp. ?Patient states vision is stable and unchanged since last visit. Denies any new floaters or FOL. ?Patient states LBS was 87 two days ago, Monday 12/29/2021. ?Last edited by Laurin Coder on 12/31/2021 10:43 AM.  ?  ? ? ?Referring physician: ?Dettinger, Fransisca Kaufmann, MD ?[address] ?[place],  [place] [address] ? ?HISTORICAL INFORMATION:  ? ?Selected notes from the [facility] ?  ? ?Lab Results  ?Component Value Date  ? HGBA1C 7.9 (H) 10/02/2021  ?  ? ?CURRENT MEDICATIONS: ?No current outpatient medications on file. (Ophthalmic Drugs)  ? ?No current facility-administered medications for this visit. (Ophthalmic Drugs)  ? ?Current Outpatient Medications (Other)  ?Medication Sig  ? ACCU-CHEK GUIDE test strip USE 1 STRIP TO CHECK GLUCOSE FIVE TIMES DAILY  ? allopurinol (ZYLOPRIM) 300 MG tablet Take 150 mg by mouth daily.  ? benazepril (LOTENSIN) 40 MG tablet Take 1 tablet (40 mg total) by mouth daily.  ? cloNIDine (CATAPRES) 0.1 MG tablet Take 1 tablet by mouth twice daily  ? clopidogrel (PLAVIX) 75 MG tablet Take 1 tablet by mouth once daily  ? Cyanocobalamin (VITAMIN B 12 PO) Take 1,000 mcg by mouth daily.  ? dapagliflozin propanediol (FARXIGA) 10 MG TABS tablet Take 1 tablet (10 mg total) by mouth daily before breakfast.  ? Dulaglutide (TRULICITY) 3 VE/9.3YB SOPN Inject 3 mg as directed once a week. [name] patient assistance program  ? DULoxetine (CYMBALTA) 30 MG capsule Take 1 capsule by mouth once daily  ? fluticasone (CUTIVATE) 0.05 % cream Apply topically.  ? furosemide (LASIX) 40 MG tablet Take 1 tablet by mouth twice daily  ? gabapentin (NEURONTIN) 300 MG capsule Take 1 capsule by mouth twice daily  ? Insulin Glargine (BASAGLAR  KWIKPEN) 100 UNIT/ML SOPN Inject 0.25 mLs (25 Units total) into the skin daily.  ? insulin regular (HUMULIN R) 100 units/mL injection Inject 0.3 mLs (30 Units total) into the skin 2 (two) times daily before a meal. (Patient not taking: Reported on 12/10/2021)  ? metoprolol succinate (TOPROL-XL) 50 MG 24 hr tablet TAKE 1 TABLET BY MOUTH ONCE DAILY TAKE  WITH  OR  IMMEDIATELY  FOLLOWING  A  MEAL  ? nitroGLYCERIN (NITROSTAT) 0.4 MG SL tablet Place 1 tablet (0.4 mg total) under the tongue every 5 (five) minutes as needed for chest pain.  ? Omega-3 Fatty Acids (FISH OIL) 1000 MG CPDR Take 1 tablet by mouth daily.  ? polyethylene glycol powder (GLYCOLAX/MIRALAX) powder Take 17 g by mouth 2 (two) times daily as needed. (Patient taking differently: Take 17 g by mouth 2 (two) times daily as needed for mild constipation or moderate constipation.)  ? rosuvastatin (CRESTOR) 5 MG tablet Take 1 tablet (5 mg total) by mouth daily.  ? ?No current facility-administered medications for this visit. (Other)  ? ? ? ? ?REVIEW OF SYSTEMS: ? ? ? ?ALLERGIES ?No Known Allergies ? ?PAST MEDICAL HISTORY ?Past Medical History:  ?Diagnosis Date  ? Adenomatous colon polyp 2006  ? CAD (coronary artery disease)   ? Calcium oxalate renal stones   ? Cardiomyopathy   ? Cataract   ? Cataract   ? Cerebral  embolism with cerebral infarction 12/26/2018  ? Chronic systolic heart failure ([place])   ?     ? CKD (chronic kidney disease) stage 4, GFR 15-29 ml/min (HCC) 11/08/2017  ? Claudication in peripheral vascular disease ([place]) 07/23/2020  ? Diabetes ([place])   ? Diabetic peripheral neuropathy ([place]) 01/31/2015  ? Dual implantable cardioverter-defibrillator in situ   ? 01/16/2002 Dr. Lovena Le  RA lead  Guidant [phone] RV lead  Guidant [phone] Generator  Guidant Prism  09/02/2009 Generator change Medtronic [id number]  SN  [id number]     ? Erectile dysfunction   ? Erosive esophagitis   ? Essential hypertension 11/30/2015  ? Hemorrhoids   ? History of TIA (transient  ischemic attack) 01/19/2019  ? HLD (hyperlipidemia)   ? HTN (hypertension)   ? Hyperlipidemia   ? Hypertensive heart disease without CHF 07/31/2011  ? ICD (implantable cardiac defibrillator) in place   ? ICD dual chamber in situ   ? Ischemic cardiomyopathy   ? EF 40% June 2013   ? Left-sided weakness 12/25/2018  ? Metabolic syndrome   ? Moderate nonproliferative diabetic retinopathy of both eyes without macular edema associated with type 2 diabetes mellitus ([place]) 02/20/2020  ? Morbid obesity ([place])   ? OSA on CPAP 11/05/2014  ? Osteoarthritis   ? Posterior vitreous detachment of both eyes 02/20/2020  ? Presence of automatic (implantable) cardiac defibrillator 11/08/2017  ? S/P CABG (coronary artery bypass graft) 11/02/2000  ? Sleep apnea   ? Stroke-like symptoms 12/26/2018  ? Syncope 08/06/2009  ? Qualifier: Diagnosis of  By: Selena Batten CMA, Jew[place]    ? Type 2 diabetes, uncontrolled, with neuropathy   ? Has retinopahty and neuropathy    ? Ventricular tachycardia (paroxysmal) ([place]) 07/14/2016  ? ?Past Surgical History:  ?Procedure Laterality Date  ? ABDOMINAL EXPLORATION SURGERY    ? BACK SURGERY    ? X'3  ? cardiac bypass    ? CARDIAC DEFIBRILLATOR PLACEMENT    ? CARPAL TUNNEL RELEASE    ? X2, bilateral  ? CATARACT EXTRACTION    ? COLONOSCOPY  06/20/2012  ? Procedure: COLONOSCOPY;  Surgeon: Sable Feil, MD;  Location: [facility];  Service: Endoscopy;  Laterality: N/A;  ? DOPPLER ECHOCARDIOGRAPHY  2003  ? EP IMPLANTABLE DEVICE N/A 07/14/2016  ? Procedure: ICD Generator Changeout;  Surgeon: Evans Lance, MD;  Location: [facility];  Service: Cardiovascular;  Laterality: N/A;  ? ESOPHAGOGASTRODUODENOSCOPY  06/20/2012  ? Procedure: ESOPHAGOGASTRODUODENOSCOPY (EGD);  Surgeon: Sable Feil, MD;  Location: [facility];  Service: Endoscopy;  Laterality: N/A;  ? EYE SURGERY    ? LAPAROTOMY    ? RETINOPATHY SURGERY Bilateral   ? rotator cuff surgery    ? left  ? ? ?FAMILY HISTORY ?Family History  ?Problem Relation Age of  Onset  ? Diabetes Brother   ? Heart disease Father   ? Heart disease Mother   ? Diabetes Mother   ? Diabetes Brother   ? Diabetes Brother   ? Cancer Brother   ?     baldder  ? Heart disease Brother   ? Diabetes Sister   ? Diabetes Sister   ? Kidney disease Sister   ?     dialysis  ? Diabetes Sister   ? Diabetes Sister   ? Diabetes Sister   ? Throat cancer Paternal Uncle   ? ? ?SOCIAL HISTORY ?Social History  ? ?Tobacco Use  ? Smoking status: Never  ? Smokeless tobacco: Never  ?Vaping  Use  ? Vaping Use: Never used  ?Substance Use Topics  ? Alcohol use: No  ?  Alcohol/week: 0.0 standard drinks  ? Drug use: No  ? ?  ? ?  ? ?OPHTHALMIC EXAM: ? ?Base Eye Exam   ? ? Visual Acuity (ETDRS)   ? ?   Right Left  ? Dist [place] 20/20 -2 20/30 -2+1  ? ?  ?  ? ? Tonometry (Tonopen, 10:45 AM)   ? ?   Right Left  ? Pressure 6 9  ? ?  ?  ? ? Pupils   ? ?   Pupils Dark Light APD  ? Right PERRL 4 3 None  ? Left PERRL 4 3 None  ? ?  ?  ? ? Visual Fields (Counting fingers)   ? ?   Left Right  ?  Full Full  ? ?  ?  ? ? Extraocular Movement   ? ?   Right Left  ?  Full Full  ? ?  ?  ? ? Neuro/Psych   ? ? Oriented x3: Yes  ? Mood/Affect: Normal  ? ?  ?  ? ? Dilation   ? ? Both eyes: 1.0% Mydriacyl, 2.5% Phenylephrine @ 10:45 AM  ? ?  ?  ? ?  ? ?Slit Lamp and Fundus Exam   ? ? External Exam   ? ?   Right Left  ? External Normal Normal  ? ?  ?  ? ? Slit Lamp Exam   ? ?   Right Left  ? Lids/Lashes Normal Normal  ? Conjunctiva/Sclera White and quiet White and quiet  ? Cornea Clear Clear  ? Anterior Chamber Deep and quiet Deep and quiet  ? Iris Round and reactive Round and reactive  ? Lens Posterior chamber intraocular lens Posterior chamber intraocular lens  ? Anterior Vitreous Normal Normal  ? ?  ?  ? ? Fundus Exam   ? ?   Right Left  ? Posterior Vitreous Posterior vitreous detachment Posterior vitreous detachment  ? Disc Normal Normal  ? C/D Ratio 0.65 0.6  ? Macula no macular thickening, Microaneurysms, no exudates no macular thickening,  Microaneurysms, no exudates  ? Vessels NPDR- Moderate NPDR- Moderate  ? Periphery Normal Normal  ? ?  ?  ? ?  ? ? ?IMAGING AND PROCEDURES  ?Imaging and Procedures for 12/31/21 ? ?OCT, Retina - OU - Both Eyes   ? ?

## 2022-01-06 ENCOUNTER — Ambulatory Visit (INDEPENDENT_AMBULATORY_CARE_PROVIDER_SITE_OTHER): Payer: Medicare Other | Admitting: Pharmacist

## 2022-01-06 DIAGNOSIS — N184 Chronic kidney disease, stage 4 (severe): Secondary | ICD-10-CM

## 2022-01-06 DIAGNOSIS — Z794 Long term (current) use of insulin: Secondary | ICD-10-CM

## 2022-01-07 ENCOUNTER — Encounter: Payer: Self-pay | Admitting: Family Medicine

## 2022-01-07 ENCOUNTER — Ambulatory Visit (INDEPENDENT_AMBULATORY_CARE_PROVIDER_SITE_OTHER): Payer: Medicare Other | Admitting: Family Medicine

## 2022-01-07 VITALS — BP 91/55 | HR 78 | Ht 65.0 in | Wt 229.0 lb

## 2022-01-07 DIAGNOSIS — Z8673 Personal history of transient ischemic attack (TIA), and cerebral infarction without residual deficits: Secondary | ICD-10-CM

## 2022-01-07 DIAGNOSIS — Z9181 History of falling: Secondary | ICD-10-CM | POA: Diagnosis not present

## 2022-01-07 DIAGNOSIS — E782 Mixed hyperlipidemia: Secondary | ICD-10-CM

## 2022-01-07 DIAGNOSIS — E1142 Type 2 diabetes mellitus with diabetic polyneuropathy: Secondary | ICD-10-CM | POA: Diagnosis not present

## 2022-01-07 DIAGNOSIS — Z794 Long term (current) use of insulin: Secondary | ICD-10-CM | POA: Diagnosis not present

## 2022-01-07 DIAGNOSIS — I10 Essential (primary) hypertension: Secondary | ICD-10-CM

## 2022-01-07 DIAGNOSIS — Z125 Encounter for screening for malignant neoplasm of prostate: Secondary | ICD-10-CM

## 2022-01-07 DIAGNOSIS — E1169 Type 2 diabetes mellitus with other specified complication: Secondary | ICD-10-CM | POA: Diagnosis not present

## 2022-01-07 DIAGNOSIS — N184 Chronic kidney disease, stage 4 (severe): Secondary | ICD-10-CM

## 2022-01-07 DIAGNOSIS — E113393 Type 2 diabetes mellitus with moderate nonproliferative diabetic retinopathy without macular edema, bilateral: Secondary | ICD-10-CM | POA: Diagnosis not present

## 2022-01-07 LAB — BAYER DCA HB A1C WAIVED: HB A1C (BAYER DCA - WAIVED): 7.7 % — ABNORMAL HIGH (ref 4.8–5.6)

## 2022-01-07 NOTE — Progress Notes (Signed)
? ?BP (!) 91/55   Pulse 78   Ht 5' 5"  (1.651 m)   Wt 229 lb (103.9 kg)   SpO2 96%   BMI 38.11 kg/m?   ? ?Subjective:  ? ?Patient ID: Manuel Kline, male    DOB: 02/13/1932, 88 y.o.   MRN: [id number] ? ?HPI: ?Manuel Kline is a 88 y.o. male presenting on 01/07/2022 for Medical Management of Chronic Issues, Diabetes, Chronic Kidney Disease, Hyperlipidemia, and Hypertension ? ? ?HPI ?Type 2 diabetes mellitus ?Patient comes in today for recheck of his diabetes. Patient has been currently taking Basaglar and Humulin and Trulicity. Patient is currently on an ACE inhibitor/ARB. Patient has not seen an ophthalmologist this year. Patient denies any issues with their feet. The symptom started onset as an adult hypertension and hyperlipidemia ARE RELATED TO DM  ? ?Hypertension ?Patient is currently on furosemide and clonidine and benazepril and metoprolol, and their blood pressure today is 91/55. Patient denies any lightheadedness or dizziness. Patient denies headaches, blurred vision, chest pains, shortness of breath, or weakness. Denies any side effects from medication and is content with current medication.  ? ?Hyperlipidemia ?Patient is coming in for recheck of his hyperlipidemia. The patient is currently taking Crestor and fish oil. They deny any issues with myalgias or history of liver damage from it. They deny any focal numbness or weakness or chest pain.  ? ?Patient uses a cane or walking stick most of the time and he says he is fallen apart is less stable and he wants a new one, wants a prescription for it.  He has a history of TIA and balance disorder that leads to high risk for falls. ? ?Relevant past medical, surgical, family and social history reviewed and updated as indicated. Interim medical history since our last visit reviewed. ?Allergies and medications reviewed and updated. ? ?Review of Systems  ?Constitutional:  Negative for chills and fever.  ?Eyes:  Negative for visual disturbance.  ?Respiratory:  Negative  for shortness of breath and wheezing.   ?Cardiovascular:  Negative for chest pain and leg swelling.  ?Musculoskeletal:  Positive for gait problem. Negative for arthralgias and back pain.  ?Skin:  Negative for rash.  ?Neurological:  Positive for weakness. Negative for dizziness, light-headedness, numbness and headaches.  ?All other systems reviewed and are negative. ? ?Per HPI unless specifically indicated above ? ? ?Allergies as of 01/07/2022   ?No Known Allergies ?  ? ?  ?Medication List  ?  ? ?  ? Accurate as of Jan 07, 2022  1:52 PM. If you have any questions, ask your nurse or doctor.  ?  ?  ? ?  ? ?Accu-Chek Guide test strip ?Generic drug: glucose blood ?USE 1 STRIP TO CHECK GLUCOSE FIVE TIMES DAILY ?  ?allopurinol 300 MG tablet ?Commonly known as: ZYLOPRIM ?Take 150 mg by mouth daily. ?  ?Basaglar KwikPen 100 UNIT/ML ?Inject 0.25 mLs (25 Units total) into the skin daily. ?  ?benazepril 40 MG tablet ?Commonly known as: LOTENSIN ?Take 1 tablet (40 mg total) by mouth daily. ?  ?cloNIDine 0.1 MG tablet ?Commonly known as: CATAPRES ?Take 1 tablet by mouth twice daily ?  ?clopidogrel 75 MG tablet ?Commonly known as: PLAVIX ?Take 1 tablet by mouth once daily ?  ?dapagliflozin propanediol 10 MG Tabs tablet ?Commonly known as: [place] ?Take 1 tablet (10 mg total) by mouth daily before breakfast. ?  ?DULoxetine 30 MG capsule ?Commonly known as: CYMBALTA ?Take 1 capsule by mouth once daily ?  ?  Fish Oil 1000 MG Cpdr ?Take 1 tablet by mouth daily. ?  ?fluticasone 0.05 % cream ?Commonly known as: CUTIVATE ?Apply topically. ?  ?furosemide 40 MG tablet ?Commonly known as: LASIX ?Take 1 tablet by mouth twice daily ?  ?gabapentin 300 MG capsule ?Commonly known as: NEURONTIN ?Take 1 capsule by mouth twice daily ?  ?insulin regular 100 units/mL injection ?Commonly known as: HumuLIN R ?Inject 0.3 mLs (30 Units total) into the skin 2 (two) times daily before a meal. ?  ?metoprolol succinate 50 MG 24 hr tablet ?Commonly known as:  TOPROL-XL ?TAKE 1 TABLET BY MOUTH ONCE DAILY TAKE  WITH  OR  IMMEDIATELY  FOLLOWING  A  MEAL ?  ?nitroGLYCERIN 0.4 MG SL tablet ?Commonly known as: NITROSTAT ?Place 1 tablet (0.4 mg total) under the tongue every 5 (five) minutes as needed for chest pain. ?  ?polyethylene glycol powder 17 GM/SCOOP powder ?Commonly known as: GLYCOLAX/MIRALAX ?Take 17 g by mouth 2 (two) times daily as needed. ?What changed: reasons to take this ?  ?rosuvastatin 5 MG tablet ?Commonly known as: Crestor ?Take 1 tablet (5 mg total) by mouth daily. ?  ?Trulicity 3 TD/9.7CB Sopn ?Generic drug: Dulaglutide ?Inject 3 mg as directed once a week. [name] patient assistance program ?  ?VITAMIN B 12 PO ?Take 1,000 mcg by mouth daily. ?  ? ?  ? ?  ?  ? ? ?  ?Durable Medical Equipment  ?(From admission, onward)  ?  ? ? ?  ? ?  Start     Ordered  ? 01/07/22 0000  For home use only DME Other see comment       ?Comments: Diagnosis history of TIA and fall risk from walking instability ? ?Walking stick cane or similar  ?Question:  Length of Need  Answer:  Lifetime  ? 01/07/22 1352  ? ?  ?  ? ?  ? ? ? ?Objective:  ? ?BP (!) 91/55   Pulse 78   Ht 5' 5"  (1.651 m)   Wt 229 lb (103.9 kg)   SpO2 96%   BMI 38.11 kg/m?   ?Wt Readings from Last 3 Encounters:  ?01/07/22 229 lb (103.9 kg)  ?12/10/21 224 lb (101.6 kg)  ?10/21/21 232 lb 8 oz (105.5 kg)  ?  ?Physical Exam ?Vitals and nursing note reviewed.  ?Constitutional:   ?   General: He is not in acute distress. ?   Appearance: He is well-developed. He is not diaphoretic.  ?Eyes:  ?   General: No scleral icterus. ?   Conjunctiva/sclera: Conjunctivae normal.  ?Neck:  ?   Thyroid: No thyromegaly.  ?Cardiovascular:  ?   Rate and Rhythm: Normal rate and regular rhythm.  ?   Heart sounds: Normal heart sounds. No murmur heard. ?Pulmonary:  ?   Effort: Pulmonary effort is normal. No respiratory distress.  ?   Breath sounds: Normal breath sounds. No wheezing.  ?Musculoskeletal:  ?   Cervical back: Neck supple.   ?Lymphadenopathy:  ?   Cervical: No cervical adenopathy.  ?Skin: ?   General: Skin is warm and dry.  ?   Findings: No rash.  ?Neurological:  ?   Mental Status: He is alert and oriented to person, place, and time.  ? ? ? ? ?Assessment & Plan:  ? ?Problem List Items Addressed This Visit   ? ?  ? Cardiovascular and Mediastinum  ? Essential hypertension  ? Relevant Orders  ? CBC with Differential/Platelet  ? CMP14+EGFR  ?  Lipid panel  ? Bayer DCA Hb A1c Waived  ?  ? Endocrine  ? Type 2 diabetes mellitus with other specified complication ([place]) - Primary  ? Relevant Orders  ? CBC with Differential/Platelet  ? CMP14+EGFR  ? Lipid panel  ? Bayer DCA Hb A1c Waived  ? Diabetic peripheral neuropathy ([place])  ? Moderate nonproliferative diabetic retinopathy of both eyes without macular edema associated with type 2 diabetes mellitus ([place])  ?  ? Genitourinary  ? CKD (chronic kidney disease) stage 4, GFR 15-29 ml/min (HCC)  ? Relevant Orders  ? CBC with Differential/Platelet  ? CMP14+EGFR  ? Lipid panel  ? Bayer DCA Hb A1c Waived  ?  ? Other  ? Hyperlipidemia (Chronic)  ? Relevant Orders  ? CBC with Differential/Platelet  ? CMP14+EGFR  ? Lipid panel  ? Bayer DCA Hb A1c Waived  ? History of TIA (transient ischemic attack)  ? Relevant Orders  ? For home use only DME Other see comment  ? ?Other Visit Diagnoses   ? ? Prostate cancer screening      ? Relevant Orders  ? PSA, total and free  ? At high risk for injury related to fall      ? Relevant Orders  ? For home use only DME Other see comment  ? ?  ?  ?A1c is 7.7 which is improved but still slightly elevated.  He is focusing on his diet.  No changes for now. ? ?Blood pressure is slightly on the lower side but he denies any lightheadedness or dizziness or feeling faint.  He will monitor closely at home and make sure he stays hydrated and does not get dehydrated. ? ?Patient has a history of TIA and is unstable when he walks because of balance, he currently uses a cane but it is falling  apart and he needs a new cane ?Follow up plan: ?Return in about 3 months (around 04/09/2022), or if symptoms worsen or fail to improve, for Diabetes recheck. ? ?Counseling provided for all of the vaccine component

## 2022-01-08 LAB — CBC WITH DIFFERENTIAL/PLATELET
Basophils Absolute: 0 10*3/uL (ref 0.0–0.2)
Basos: 0 %
EOS (ABSOLUTE): 0.2 10*3/uL (ref 0.0–0.4)
Eos: 3 %
Hematocrit: 37.9 % (ref 37.5–51.0)
Hemoglobin: 13.4 g/dL (ref 13.0–17.7)
Immature Grans (Abs): 0 10*3/uL (ref 0.0–0.1)
Immature Granulocytes: 0 %
Lymphocytes Absolute: 2 10*3/uL (ref 0.7–3.1)
Lymphs: 25 %
MCH: 31.4 pg (ref 26.6–33.0)
MCHC: 35.4 g/dL (ref 31.5–35.7)
MCV: 89 fL (ref 79–97)
Monocytes Absolute: 0.6 10*3/uL (ref 0.1–0.9)
Monocytes: 8 %
Neutrophils Absolute: 5 10*3/uL (ref 1.4–7.0)
Neutrophils: 64 %
Platelets: 209 10*3/uL (ref 150–450)
RBC: 4.27 x10E6/uL (ref 4.14–5.80)
RDW: 14.5 % (ref 11.6–15.4)
WBC: 7.9 10*3/uL (ref 3.4–10.8)

## 2022-01-08 LAB — CMP14+EGFR
ALT: 22 IU/L (ref 0–44)
AST: 33 IU/L (ref 0–40)
Albumin/Globulin Ratio: 1.7 (ref 1.2–2.2)
Albumin: 4.3 g/dL (ref 3.6–4.6)
Alkaline Phosphatase: 97 IU/L (ref 44–121)
BUN/Creatinine Ratio: 19 (ref 10–24)
BUN: 46 mg/dL — ABNORMAL HIGH (ref 8–27)
Bilirubin Total: 0.5 mg/dL (ref 0.0–1.2)
CO2: 22 mmol/L (ref 20–29)
Calcium: 9.5 mg/dL (ref 8.6–10.2)
Chloride: 97 mmol/L (ref 96–106)
Creatinine, Ser: 2.38 mg/dL — ABNORMAL HIGH (ref 0.76–1.27)
Globulin, Total: 2.6 g/dL (ref 1.5–4.5)
Glucose: 154 mg/dL — ABNORMAL HIGH (ref 70–99)
Potassium: 4.5 mmol/L (ref 3.5–5.2)
Sodium: 138 mmol/L (ref 134–144)
Total Protein: 6.9 g/dL (ref 6.0–8.5)
eGFR: 26 mL/min/{1.73_m2} — ABNORMAL LOW (ref >59)

## 2022-01-08 LAB — LIPID PANEL
Chol/HDL Ratio: 3.8 ratio (ref 0.0–5.0)
Cholesterol, Total: 129 mg/dL (ref 100–199)
HDL: 34 mg/dL — ABNORMAL LOW (ref >39)
LDL Chol Calc (NIH): 54 mg/dL (ref 0–99)
Triglycerides: 260 mg/dL — ABNORMAL HIGH (ref 0–149)
VLDL Cholesterol Cal: 41 mg/dL — ABNORMAL HIGH (ref 5–40)

## 2022-01-08 LAB — PSA, TOTAL AND FREE
PSA, Free Pct: 12.1 %
PSA, Free: 0.17 ng/mL
Prostate Specific Ag, Serum: 1.4 ng/mL (ref 0.0–4.0)

## 2022-01-11 ENCOUNTER — Other Ambulatory Visit: Payer: Self-pay | Admitting: Family Medicine

## 2022-01-11 DIAGNOSIS — Z8673 Personal history of transient ischemic attack (TIA), and cerebral infarction without residual deficits: Secondary | ICD-10-CM

## 2022-01-11 DIAGNOSIS — I5022 Chronic systolic (congestive) heart failure: Secondary | ICD-10-CM

## 2022-01-13 ENCOUNTER — Encounter: Payer: Self-pay | Admitting: Family Medicine

## 2022-01-13 NOTE — Progress Notes (Signed)
? ? ?PATIENT: Manuel Kline ?DOB: [birth year] ? ?REASON FOR VISIT: follow up ?HISTORY FROM: patient ? ?Chief Complaint  ?Patient presents with  ? Follow-up  ?  Pt with son, rm 80. Doing well on CPAP. No concerns. DME. [facility].   ?  ? ?HISTORY OF PRESENT ILLNESS: ? ?01/14/22 ALL:  ?Manuel Kline is a 88 y.o. male here today for follow up for OSA on CPAP.  He was seen by Manuel Manuel Kline 10/2021 needing a new CPAP. HST 11/03/2021 confirmed severe OSA with total AHI 42.2/hr, REM AHI 72.5/hr with hypoxia noted. AutoPAP was ordered. He repots doing very well on CPAP. He has adjusted to new machine. He is using therapy about 7.5 hours every night. He reports that he sleeps very well. No concerns with machine or supplies.  ? ?He is followed closely by PCP for stroke prevention. He continues Plavix and rosuvastatin.  ? ? ? ?HISTORY: (copied from Manuel Kline's previous note) ? ?Manuel Kline is a 88 y.o. year old White or Caucasian male patient seen here as a referral on 10/21/2021 from Manuel Manuel Parisian, MD.  ?  ?Chief concern according to patient :  Rm 10, w daughter Manuel Kline. Internal referral for OSA. Last SS done in 2015. Pt is severely hearing impaired, sleeps more than usual, has neuropathy-pacemaker, CKD , here to re visit since last visit with Manuel. Leonie Kline in 2020 for TIA and a small stroke, incoherent, facial droop.  ? Pt has been using his CPAP nightly but seems to not be working properly. Pt here to get a new CPAP is possible  DME [facility].  ? ?The patient had the first sleep study in the year 2007. ?  ?Note from 07-2014: Manuel Kline was last seen 7-1/2 years ago in my practice, at that time a patient of Manuel Kline.  ?At the time he carried the diagnosis of a complicated or so-called complex sleep apnea syndrome. The patient was placed on a VPAP because of his cardiac history including cardiomyopathy, coronary artery disease, congestive heart failure and he has a defibrillator implanted. His residual apnea index was 7.5 his sleep study in 2007  diagnosed him with an AHI of 77 apneas per hour of sleep. The patient had meanwhile felt that the machine did not longer benefit him or that it actually multifunctioned. He has not been using it since 2012, but never contacted [place] or the DME. ?He is here today to see if he actually still needs apnea treatment and if his condition is still present.  ?He is here today to find out  what the best treatment would be should be still find apnea to be present. ?  ?Sleep relevant medical history: Manuel Kline has used his most recent CPAP continuously.  He is now "reading it up" and he needs desperately either a new machine or at least new supplies.  I understand that his machine was prescribed in 2015 so it is by now 64-1/86 years old and it would need to be replaced in any case.  Our possibilities of testing however are quite different from the time when we first met.  We can do a home sleep test to simply confirm the presence of sleep apnea and then based on that order a CPAP device for him.  If he has complex sleep apnea so-called central sleep apnea we would still prefer for the patient to come to the sleep lab to be titrated as some patients get worse on the CPAP. ?  ?  Family medical /sleep history:  daughter and son on CPAP with OSA, had 9 siblings, 2 brothers had OSA.  ?  ?Social history:  Patient is retired from  plumbing, and lives in a household with spouse, and a daughter lives close by, ex son in law is helping.  Meal on wheels, daughter sets pill boxes.   ?Family status is married , with adult children, and grandchildren.  ?Active churchgoer,  ?Tobacco use never .  ETOH use none, Caffeine intake in form of Coffee( 1 in am ) Hobbies :[employer] ? ?Sleep habits are as follows: The patient's dinner time is between 5-6 PM. He snacks later- high carb diet- ?The patient goes to bed at 11 PM and continues to sleep for 6-7 hours, he kicks and moves all the  time- wakes for 1-2 bathroom breaks.  He reports foot pain and  jerking in daytime, and while resting in his recliner.  ?The preferred sleep position is supine and on his side , with the support of 2 pillows. Head of bed elevated. Dreams are reportedly frequent.  ?7  AM is the usual rise time. The patient wakes up spontaneously/- he has a "breakfast cook" coming to his home.  ?He reports not feeling refreshed or restored in AM, with symptoms such as dry mouth, leg pain. ?Naps are taken frequently, lasting from 10 to 30 minutes and are more refreshing than nocturnal sleep.  ? ? ?REVIEW OF SYSTEMS: Out of a complete 14 system review of symptoms, the patient complains only of the following symptoms, none and all other reviewed systems are negative. ? ? ?ALLERGIES: ?No Known Allergies ? ?HOME MEDICATIONS: ?Outpatient Medications Prior to Visit  ?Medication Sig Dispense Refill  ? ACCU-CHEK GUIDE test strip USE 1 STRIP TO CHECK GLUCOSE FIVE TIMES DAILY    ? allopurinol (ZYLOPRIM) 300 MG tablet Take 1/2 (one-half) tablet by mouth once daily 45 tablet 0  ? benazepril (LOTENSIN) 40 MG tablet Take 1 tablet (40 mg total) by mouth daily. 90 tablet 3  ? cloNIDine (CATAPRES) 0.1 MG tablet Take 1 tablet by mouth twice daily 180 tablet 0  ? clopidogrel (PLAVIX) 75 MG tablet Take 1 tablet by mouth once daily 90 tablet 0  ? Cyanocobalamin (VITAMIN B 12 PO) Take 1,000 mcg by mouth daily.    ? dapagliflozin propanediol (FARXIGA) 10 MG TABS tablet Take 1 tablet (10 mg total) by mouth daily before breakfast. 90 tablet 5  ? Dulaglutide (TRULICITY) 3 JE/5.6DJ SOPN Inject 3 mg as directed once a week. [name] patient assistance program 6 mL 11  ? DULoxetine (CYMBALTA) 30 MG capsule Take 1 capsule by mouth once daily 90 capsule 0  ? fluticasone (CUTIVATE) 0.05 % cream Apply topically.    ? furosemide (LASIX) 40 MG tablet Take 1 tablet by mouth twice daily 180 tablet 0  ? gabapentin (NEURONTIN) 300 MG capsule Take 1 capsule by mouth twice daily 180 capsule 0  ? Insulin Glargine (BASAGLAR KWIKPEN) 100  UNIT/ML SOPN Inject 0.25 mLs (25 Units total) into the skin daily.    ? insulin regular (HUMULIN R) 100 units/mL injection Inject 0.3 mLs (30 Units total) into the skin 2 (two) times daily before a meal. 60 mL 3  ? metoprolol succinate (TOPROL-XL) 50 MG 24 hr tablet TAKE 1 TABLET BY MOUTH ONCE DAILY TAKE  WITH  OR  IMMEDIATELY  FOLLOWING  A  MEAL 90 tablet 0  ? nitroGLYCERIN (NITROSTAT) 0.4 MG SL tablet Place 1 tablet (0.4 mg  total) under the tongue every 5 (five) minutes as needed for chest pain. 25 tablet 0  ? Omega-3 Fatty Acids (FISH OIL) 1000 MG CPDR Take 1 tablet by mouth daily.    ? polyethylene glycol powder (GLYCOLAX/MIRALAX) powder Take 17 g by mouth 2 (two) times daily as needed. (Patient taking differently: Take 17 g by mouth 2 (two) times daily as needed for mild constipation or moderate constipation.) 3350 g 1  ? rosuvastatin (CRESTOR) 5 MG tablet Take 1 tablet (5 mg total) by mouth daily. 90 tablet 3  ? ?No facility-administered medications prior to visit.  ? ? ?PAST MEDICAL HISTORY: ?Past Medical History:  ?Diagnosis Date  ? Adenomatous colon polyp 2006  ? CAD (coronary artery disease)   ? Calcium oxalate renal stones   ? Cardiomyopathy   ? Cataract   ? Cataract   ? Cerebral embolism with cerebral infarction 12/26/2018  ? Chronic systolic heart failure ([place])   ?     ? CKD (chronic kidney disease) stage 4, GFR 15-29 ml/min (HCC) 11/08/2017  ? Claudication in peripheral vascular disease ([place]) 07/23/2020  ? Diabetes ([place])   ? Diabetic peripheral neuropathy ([place]) 01/31/2015  ? Dual implantable cardioverter-defibrillator in situ   ? 01/16/2002 Manuel. Lovena Le  RA lead  Guidant [id number] RV lead  Guidant [id number] Generator  Guidant Prism  09/02/2009 Generator change Medtronic [id number]  SN  [id number] H     ? Erectile dysfunction   ? Erosive esophagitis   ? Essential hypertension 11/30/2015  ? Hemorrhoids   ? History of TIA (transient ischemic attack) 01/19/2019  ? HLD (hyperlipidemia)   ? HTN (hypertension)   ?  Hyperlipidemia   ? Hypertensive heart disease without CHF 07/31/2011  ? ICD (implantable cardiac defibrillator) in place   ? ICD dual chamber in situ   ? Ischemic cardiomyopathy   ? EF 40% June 2013   ? Left-sided

## 2022-01-13 NOTE — Patient Instructions (Signed)
Please continue using your CPAP regularly. While your insurance requires that you use CPAP at least 4 hours each night on 70% of the nights, I recommend, that you not skip any nights and use it throughout the night if you can. Getting used to CPAP and staying with the treatment long term does take time and patience and discipline. Untreated obstructive sleep apnea when it is moderate to severe can have an adverse impact on cardiovascular health and raise her risk for heart disease, arrhythmias, hypertension, congestive heart failure, stroke and diabetes. Untreated obstructive sleep apnea causes sleep disruption, nonrestorative sleep, and sleep deprivation. This can have an impact on your day to day functioning and cause daytime sleepiness and impairment of cognitive function, memory loss, mood disturbance, and problems focussing. Using CPAP regularly can improve these symptoms. ? ?Continue close follow up with PCP and care team for management of co morbidities and stroke prevention.  ? ?Follow up in 1 year  ?

## 2022-01-14 ENCOUNTER — Ambulatory Visit: Payer: Medicare Other | Admitting: Family Medicine

## 2022-01-14 ENCOUNTER — Encounter: Payer: Self-pay | Admitting: Family Medicine

## 2022-01-14 VITALS — BP 129/70 | HR 81 | Ht 65.0 in | Wt 229.0 lb

## 2022-01-14 DIAGNOSIS — Z9989 Dependence on other enabling machines and devices: Secondary | ICD-10-CM

## 2022-01-14 DIAGNOSIS — G4733 Obstructive sleep apnea (adult) (pediatric): Secondary | ICD-10-CM | POA: Diagnosis not present

## 2022-01-14 MED ORDER — DAPAGLIFLOZIN PROPANEDIOL 10 MG PO TABS: 10.0000 mg | ORAL_TABLET | Freq: Every day | ORAL | 5 refills | Status: DC

## 2022-01-14 NOTE — Patient Instructions (Addendum)
Visit Information ? ?Following are the goals we discussed today:  ?Current Barriers:  ?Unable to independently afford treatment regimen ?Unable to maintain control of T2DM ?Suboptimal therapeutic regimen for T2DM ? ?Pharmacist Clinical Goal(s):  ?patient will verbalize ability to afford treatment regimen ?achieve control of T2DM as evidenced by GOAL A1C<7% ?adhere to plan to optimize therapeutic regimen for T2DM as evidenced by report of adherence to recommended medication management changes through collaboration with PharmD and provider.  ? ?Interventions: ?1:1 collaboration with Dettinger, Fransisca Kaufmann, MD regarding development and update of comprehensive plan of care as evidenced by provider attestation and co-signature ?Inter-disciplinary care team collaboration (see longitudinal plan of care) ?Comprehensive medication review performed; medication list updated in electronic medical record ? ?Diabetes: Goal on Track (progressing): YES. ?Uncontrolled-A1C 7.9%-->7.7% (okay for 38 yoM), GFR 37-->30-->26;  ?Current treatment: meds were initially chosen prior to pharmd appt due to cost/patient comfort; we are working to optimize them ?INCREASE TRULICITY TO '3MG'$  SQ WEEKLY  ?Denies personal and family history of Medullary thyroid cancer (MTC) ?Continue Basaglar 25 units once daily (patient has been taking twice daily) ?START FARXIGA '10MG'$  DAILY-GFR 25-30 (sees nephro) ?WILL ENROLL IN [place] PATIENT ASSISTANCE PROGRAM--application send today (escribed to [facility]) ?Stable on samples ?DECREASE Humulin R to 20 units twice daily with meals ?  ?Patient enrolled in PAP with [name] for TRULICITY, [facility] ESCRIBED TO [facility] PER PROGRAM; ships 63-monthsupplies to patient's home every quarter(due to back orders unlikely to ship more than 1 month at a time) ?Current glucose readings: fasting glucose: <200, post prandial glucose: 200s ?Denies hypoglycemic/hyperglycemic symptoms ?Discussed meal  planning options and Plate method for healthy eating ?Avoid sugary drinks and desserts ?Incorporate balanced protein, non starchy veggies, 1 serving of carbohydrate with each meal ?Increase water intake ?Increase physical activity as able ?Current exercise: patient unable due to physical condition ?Recommended increase glp1; work to optimize insulin regimen and POs for T2DM ?Assessed patient finances. Re enrollment and dose changes sent to [employer] patient assistance program--will follow ? ?Patient Goals/Self-Care Activities ?patient will:  ?- take medications as prescribed as evidenced by patient report and record review ?check glucose daily (3 times) or when symptomatic, document, and provide at future appointments ?collaborate with provider on medication access solutions ?engage in dietary modifications by FOLLOWING A HEART HEALTHY DIET/HEALTHY PLATE METHOD ? ? ? ?Plan: Telephone follow up appointment with care management team member scheduled for:  3 MONTHS ? ?Signature ?JRegina Eck PharmD, BCPS ?Clinical Pharmacist, [facility] ?[place] Phone [phone]? ? ?Please call the care guide team at [phone] you need to cancel or reschedule your appointment.  ? ?The patient verbalized understanding of instructions, educational materials, and care plan provided today and DECLINED offer to receive copy of patient instructions, educational materials, and care plan.  ? ?

## 2022-01-14 NOTE — Progress Notes (Signed)
Chronic Care Management Pharmacy Note  01/06/2022 Name:  Manuel Kline MRN:  [id number] DOB:  November 09, 1931  Summary:  Diabetes: Goal on Track (progressing): YES. Uncontrolled-A1C 7.9%-->7.7% (okay for 26 yoM), GFR 37-->30-->26;  Current treatment: meds were initially chosen prior to pharmd appt due to cost/patient comfort; we are working to optimize them Bear  Denies personal and family history of Medullary thyroid cancer (MTC) Continue Basaglar 25 units once daily (patient has been taking twice daily) START FARXIGA 10MG DAILY-GFR 25-30 (sees nephro) [place] PATIENT [place]ISTANCE PRO[place]--application send today (escribed to [facility]) Stable on samples DECREASE Humulin R to 20 units twice daily with meals   Patient enrolled in PAP with [name] for TRULICITY, [place] R RXS ESCRIBED TO [facility] [place]; ships 45-monthsupplies to patient's home every quarter(due to back orders unlikely to ship more than 1 month at a time) Current glucose readings: fasting glucose: <200, post prandial glucose: 200s Denies hypoglycemic/hyperglycemic symptoms Discussed meal planning options and Plate method for healthy eating Avoid sugary drinks and desserts Incorporate balanced protein, non starchy veggies, 1 serving of carbohydrate with each meal Increase water intake Increase physical activity as able Current exercise: patient unable due to physical condition Recommended increase glp1; work to optimize insulin regimen and POs for T2DM Assessed patient finances. Re enrollment and dose changes sent to [employer] patient assistance program--will follow  Subjective: Manuel PITTINGERis an 88y.o. year old male who is a primary patient of Dettinger, JFransisca Kaufmann MD.  The CCM team was consulted for assistance with disease management and care coordination needs.    Engaged with patient by telephone for follow up visit in response to  provider referral for pharmacy case management and/or care coordination services.   Consent to Services:  The patient was given information about Chronic Care Management services, agreed to services, and gave verbal consent prior to initiation of services.  Please see initial visit note for detailed documentation.   Patient Care Team: Dettinger, JFransisca Kaufmann MD as PCP - General (Family Medicine) TEvans Lance MD as PCP - Electrophysiology (Cardiology) KPark Liter MD as PCP - Cardiology (Cardiology) GRegenia Skeeter MD as Consulting Physician (Internal Medicine) Regal, NTamala Fothergill DPM as Consulting Physician (Podiatry) RZadie RhineGClent Demark MD as Consulting Physician (Ophthalmology) TEvans Lance MD as Consulting Physician (Cardiology) MGardiner Barefoot DPM as Consulting Physician (Podiatry) Pleasant, FEppie Gibson RN as TSt. MarysManagement Bradi Arbuthnot, JRoyce Macadamia [facility]ust[facility]i LP(Pharmacist) Dohmeier, CAsencion Partridge MD as Consulting Physician (Neurology)  Objective:  Lab Results  Component Value Date   CREATININE 2.38 (H) 01/07/2022   CREATININE 2.11 (H) 10/02/2021   CREATININE 1.96 (H) 07/02/2021    Lab Results  Component Value Date   HGBA1C 7.7 (H) 01/07/2022   Last diabetic Eye exam:  Lab Results  Component Value Date/Time   HMDIABEYEEXA Retinopathy (A) 03/06/2021 12:00 AM    Last diabetic Foot exam: No results found for: HMDIABFOOTEX      Component Value Date/Time   CHOL 129 01/07/2022 1325   TRIG 260 (H) 01/07/2022 1325   HDL 34 (L) 01/07/2022 1325   CHOLHDL 3.8 01/07/2022 1325   CHOLHDL 3.4 12/26/2018 0558   VLDL 36 12/26/2018 0558   LDLCALC 54 01/07/2022 1325       Latest Ref Rng & Units 01/07/2022    1:25 PM 10/02/2021    9:12 AM 07/02/2021    1:34 PM  Hepatic Function  Total Protein 6.0 - 8.5 g/dL 6.9   6.9   6.9    Albumin 3.6 - 4.6 g/dL 4.3   4.6   4.4    AST 0 - 40 IU/L 33   26   30    ALT 0 - 44 IU/L _0 Alk Phosphatase 44 - 121 IU/L 97    122   117    Total Bilirubin 0.0 - 1.2 mg/dL 0.5   0.6   0.6      Lab Results  Component Value Date/Time   TSH 1.421 12/25/2018 10:40 PM       Latest Ref Rng & Units 01/07/2022    1:25 PM 10/02/2021    9:12 AM 07/02/2021    1:34 PM  CBC  WBC 3.4 - 10.8 x10E3/uL 7.9   7.3   7.7    Hemoglobin 13.0 - 17.7 g/dL 13.4   13.9   13.9    Hematocrit 37.5 - 51.0 % 37.9   41.1   40.0    Platelets 150 - 450 x10E3/uL 209   182   211      No results found for: VD25OH  Clinical ASCVD: Yes  The ASCVD Risk score (Arnett DK, et al., 2019) failed to calculate for the following reasons:   The 2019 ASCVD risk score is only valid for ages 53 to 68   The patient has a prior MI or stroke diagnosis    Other: (CHADS2VASc if Afib, PHQ9 if depression, MMRC or CAT for COPD, ACT, DEXA)  Social History   Tobacco Use  Smoking Status Never  Smokeless Tobacco Never   BP Readings from Last 3 Encounters:  01/14/22 129/70  01/07/22 (!) 91/55  10/21/21 (!) 153/75   Pulse Readings from Last 3 Encounters:  01/14/22 81  01/07/22 78  10/21/21 86   Wt Readings from Last 3 Encounters:  01/14/22 229 lb (103.9 kg)  01/07/22 229 lb (103.9 kg)  12/10/21 224 lb (101.6 kg)    Assessment: Review of patient past medical history, allergies, medications, health status, including review of consultants reports, laboratory and other test data, was performed as part of comprehensive evaluation and provision of chronic care management services.   SDOH:  (Social Determinants of Health) assessments and interventions performed:    CCM Care Plan  No Known Allergies  Medications Reviewed Today     Reviewed by Lavera Guise, [facility] (Pharmacist) on 01/14/22 at 1501  Med List Status: <None>   Medication Order Taking? Sig Documenting Provider Last Dose Status Informant  ACCU-CHEK GUIDE test strip [id number] No USE 1 STRIP TO CHECK GLUCOSE FIVE TIMES DAILY [provider] Taking Active   allopurinol (ZYLOPRIM) 300  MG tablet [id number] No Take 1/2 (one-half) tablet by mouth once daily Dettinger, Fransisca Kaufmann, MD Taking Active   benazepril (LOTENSIN) 40 MG tablet [id number] No Take 1 tablet (40 mg total) by mouth daily. Dettinger, Fransisca Kaufmann, MD Taking Active   cloNIDine (CATAPRES) 0.1 MG tablet [id number] No Take 1 tablet by mouth twice daily Dettinger, Fransisca Kaufmann, MD Taking Active   clopidogrel (PLAVIX) 75 MG tablet [id number] No Take 1 tablet by mouth once daily Dettinger, Fransisca Kaufmann, MD Taking Active   Cyanocobalamin (VITAMIN B 12 PO) [id number] No Take 1,000 mcg by mouth daily. [provider] Taking Active Spouse/Significant Other  dapagliflozin propanediol (FARXIGA) 10 MG TABS tablet [id number] No Take 1 tablet (10 mg total) by  mouth daily before breakfast. Dettinger, Fransisca Kaufmann, MD Taking Active            Med Note Blanca Friend, Royce Macadamia   Wed Nov 05, 2021  1:23 PM) Via [place]   Dulaglutide (TRULICITY) 3 OB/0.9GG SOPN [id number] No Inject 3 mg as directed once a week. [name] patient assistance program Dettinger, Fransisca Kaufmann, MD Taking Active            Med Note Blanca Friend, Omer Jack Oct 05, 2021 11:21 PM) [place] patient assistance program    DULoxetine (CYMBALTA) 30 MG capsule [id number] No Take 1 capsule by mouth once daily Dettinger, Fransisca Kaufmann, MD Taking Active   fluticasone (CUTIVATE) 0.05 % cream [id number] No Apply topically. [provider] Taking Active   furosemide (LASIX) 40 MG tablet [id number] No Take 1 tablet by mouth twice daily Dettinger, Fransisca Kaufmann, MD Taking Active   gabapentin (NEURONTIN) 300 MG capsule [id number] No Take 1 capsule by mouth twice daily Dettinger, Fransisca Kaufmann, MD Taking Active   Insulin Glargine [facility] KWIKPEN) 100 UNIT/ML SOPN [id number] No Inject 0.25 mLs (25 Units total) into the skin daily. Dettinger, Fransisca Kaufmann, MD Taking Active            Med Note Blanca Friend, Omer Jack Oct 05, 2021 11:21 PM) [place] patient assistance program     insulin regular (HUMULIN R) 100 units/mL injection [id number] No Inject 0.3 mLs (30 Units total) into the skin 2 (two) times daily before a meal. Dettinger, Fransisca Kaufmann, MD Taking Active            Med Note Jeddo, AMY E   Wed Dec 10, 2021  9:19 AM) Needs patient assistance  metoprolol succinate (TOPROL-XL) 50 MG 24 hr tablet [id number] No TAKE 1 TABLET BY MOUTH ONCE DAILY TAKE  WITH  OR  IMMEDIATELY  FOLLOWING  A  MEAL Dettinger, Fransisca Kaufmann, MD Taking Active   nitroGLYCERIN (NITROSTAT) 0.4 MG SL tablet [id number] No Place 1 tablet (0.4 mg total) under the tongue every 5 (five) minutes as needed for chest pain. Dettinger, Fransisca Kaufmann, MD Taking Active   Omega-3 Fatty Acids (FISH OIL) 1000 MG CPDR [id number] No Take 1 tablet by mouth daily. [provider] Taking Active Spouse/Significant Other  polyethylene glycol powder (GLYCOLAX/MIRALAX) powder [id number] No Take 17 g by mouth 2 (two) times daily as needed.  Patient taking differently: Take 17 g by mouth 2 (two) times daily as needed for mild constipation or moderate constipation.   Eustaquio Maize, MD Taking Active   rosuvastatin (CRESTOR) 5 MG tablet [id number] No Take 1 tablet (5 mg total) by mouth daily. Dettinger, Fransisca Kaufmann, MD Taking Active             Patient Active Problem List   Diagnosis Date Noted   Callus 10/29/2021   Glaucoma suspect with open angle 04/02/2021   Pseudophakia of both eyes 08/20/2020   Claudication in peripheral vascular disease ([place]) 07/23/2020   Sleep apnea    Osteoarthritis    Metabolic syndrome    Erosive esophagitis    Erectile dysfunction    Calcium oxalate renal stones    Moderate nonproliferative diabetic retinopathy of both eyes without macular edema associated with type 2 diabetes mellitus ([place]) 02/20/2020   Posterior vitreous detachment of both eyes 02/20/2020   History of TIA (transient ischemic attack) 01/19/2019   Cerebral embolism with cerebral infarction 12/26/2018   CKD (chronic kidney  disease) stage 4, GFR 15-29 ml/min (HCC) 11/08/2017   Presence of automatic (implantable) cardiac defibrillator 11/08/2017   Ventricular tachycardia (paroxysmal) ([place]) 07/14/2016   Essential hypertension 11/30/2015   Diabetic peripheral neuropathy ([place]) 01/31/2015   OSA on CPAP 11/05/2014   CAD (coronary artery disease)    Dual implantable cardioverter-defibrillator in situ    Hyperlipidemia    Type 2 diabetes mellitus with other specified complication ([place])    Morbid obesity ([place])    Ischemic cardiomyopathy    Chronic systolic heart failure (HCC)    S/P CABG (coronary artery bypass graft) 11/02/2000    Immunization History  Administered Date(s) Administered   Fluad Quad(high Dose 65+) 06/26/2019, 05/22/2020, 06/16/2021   Influenza, High Dose Seasonal PF 06/14/2017, 07/19/2018   Influenza,inj,Quad PF,6+ Mos 06/12/2014, 06/24/2015, 06/17/2016   Moderna Sars-Covid-2 Vaccination 09/12/2019, 10/13/2019, 07/04/2020   Pneumococcal Conjugate-13 03/01/2015   Pneumococcal Polysaccharide-23 03/16/2016   Tdap 02/16/2020   Zoster Recombinat (Shingrix) 02/17/2020, 09/17/2020    Conditions to be addressed/monitored: DMII and CKD Stage 3b  Care Plan : PHARMD MEDICATION MANAGEMENT  Updates made by Lavera Guise, [place] since 01/14/2022 12:00 AM     Problem: CHL AMB "PATIENT-SPECIFIC PROBLEM"      Long-Range Goal: T2DM ,HLD PHARMD GOAL   Recent Progress: Not on track  Priority: High  Note:   Current Barriers:  Unable to independently afford treatment regimen Unable to maintain control of T2DM Suboptimal therapeutic regimen for T2DM  Pharmacist Clinical Goal(s):  patient will verbalize ability to afford treatment regimen achieve control of T2DM as evidenced by GOAL A1C<7% adhere to plan to optimize therapeutic regimen for T2DM as evidenced by report of adherence to recommended medication management changes through collaboration with PharmD and provider.   Interventions: 1:1  collaboration with Dettinger, Fransisca Kaufmann, MD regarding development and update of comprehensive plan of care as evidenced by provider attestation and co-signature Inter-disciplinary care team collaboration (see longitudinal plan of care) Comprehensive medication review performed; medication list updated in electronic medical record  Diabetes: Goal on Track (progressing): YES. Uncontrolled-A1C 7.9%-->7.7% (okay for 49 yoM), GFR 37-->30-->26;  Current treatment: meds were initially chosen prior to pharmd appt due to cost/patient comfort; we are working to optimize them INCREASE TRULICITY TO 3MG SQ WEEKLY  Denies personal and family history of Medullary thyroid cancer (MTC) Continue Basaglar 25 units once daily (patient has been taking twice daily) START FARXIGA 10MG DAILY-GFR 25-30 (sees nephro) WILL E[place]OLL IN [place] PATIENT ASSISTANCE PROGRAM--application send today (escribed to [facility]) Stable on samples DECREASE Humulin R to 20 units twice daily with meals   Patient enrolled in PAP with [name] for TRULICITY, [place] R RXS ESCRIBED TO [facility] [place]; ships 44-monthsupplies to patient's home every quarter(due to back orders unlikely to ship more than 1 month at a time) Current glucose readings: fasting glucose: <200, post prandial glucose: 200s Denies hypoglycemic/hyperglycemic symptoms Discussed meal planning options and Plate method for healthy eating Avoid sugary drinks and desserts Incorporate balanced protein, non starchy veggies, 1 serving of carbohydrate with each meal Increase water intake Increase physical activity as able Current exercise: patient unable due to physical condition Recommended increase glp1; work to optimize insulin regimen and POs for T2DM Assessed patient finances. Re enrollment and dose changes sent to [employer] patient assistance program--will follow  Patient Goals/Self-Care Activities patient will:  - take  medications as prescribed as evidenced by patient report and record review check glucose daily (3 times) or when  symptomatic, document, and provide at future appointments collaborate with provider on medication access solutions engage in dietary modifications by   FOLLOWING A HEART HEALTHY DIET/HEALTHY PLATE METHOD       Follow Up:  Patient agrees to Care Plan and Follow-up.  Plan: Telephone follow up appointment with care management team member scheduled for:  3 months  Regina Eck, PharmD, BCPS Clinical Pharmacist, [place]  II Phone [phone]

## 2022-01-19 DIAGNOSIS — G4733 Obstructive sleep apnea (adult) (pediatric): Secondary | ICD-10-CM | POA: Diagnosis not present

## 2022-01-20 ENCOUNTER — Encounter: Payer: Medicare Other | Admitting: Family Medicine

## 2022-01-21 ENCOUNTER — Telehealth: Payer: Self-pay

## 2022-01-21 ENCOUNTER — Ambulatory Visit (INDEPENDENT_AMBULATORY_CARE_PROVIDER_SITE_OTHER): Payer: Medicare Other

## 2022-01-21 DIAGNOSIS — I255 Ischemic cardiomyopathy: Secondary | ICD-10-CM

## 2022-01-21 NOTE — Telephone Encounter (Signed)
Received notification from [place] regarding approval for [facility]. Patient assistance approved from 01/20/22 to 08/30/22.  ALLOW 7-10 BUSINESS DAYS FOR MEDICATION TO SHIP  Phone: [phone]

## 2022-01-22 ENCOUNTER — Encounter: Payer: Self-pay | Admitting: Cardiology

## 2022-01-22 ENCOUNTER — Ambulatory Visit: Payer: Medicare Other | Admitting: Cardiology

## 2022-01-22 ENCOUNTER — Other Ambulatory Visit: Payer: Self-pay

## 2022-01-22 VITALS — BP 98/66 | HR 65 | Ht 64.0 in | Wt 230.0 lb

## 2022-01-22 DIAGNOSIS — Z951 Presence of aortocoronary bypass graft: Secondary | ICD-10-CM | POA: Diagnosis not present

## 2022-01-22 DIAGNOSIS — I255 Ischemic cardiomyopathy: Secondary | ICD-10-CM | POA: Diagnosis not present

## 2022-01-22 DIAGNOSIS — I5022 Chronic systolic (congestive) heart failure: Secondary | ICD-10-CM | POA: Diagnosis not present

## 2022-01-22 DIAGNOSIS — I2510 Atherosclerotic heart disease of native coronary artery without angina pectoris: Secondary | ICD-10-CM

## 2022-01-22 LAB — CUP PACEART REMOTE DEVICE CHECK
Battery Remaining Longevity: 50 mo
Battery Voltage: 2.98 V
Brady Statistic AP VP Percent: 0.38 %
Brady Statistic AP VS Percent: 73.03 %
Brady Statistic AS VP Percent: 0.02 %
Brady Statistic AS VS Percent: 26.57 %
Brady Statistic RA Percent Paced: 73.4 %
Brady Statistic RV Percent Paced: 0.4 %
Date Time Interrogation Session: 20230525061808
HighPow Impedance: 51 Ohm
HighPow Impedance: 70 Ohm
Implantable Lead Implant Date: 20030519
Implantable Lead Implant Date: 20030519
Implantable Lead Location: 753859
Implantable Lead Location: 753860
Implantable Lead Model: 158
Implantable Lead Model: 4087
Implantable Lead Serial Number: 115102
Implantable Lead Serial Number: 159999
Implantable Pulse Generator Implant Date: 20171114
Lead Channel Impedance Value: 4047 Ohm
Lead Channel Impedance Value: 4047 Ohm
Lead Channel Impedance Value: 4047 Ohm
Lead Channel Impedance Value: 437 Ohm
Lead Channel Impedance Value: 437 Ohm
Lead Channel Impedance Value: 437 Ohm
Lead Channel Pacing Threshold Amplitude: 1 V
Lead Channel Pacing Threshold Amplitude: 1.375 V
Lead Channel Pacing Threshold Pulse Width: 0.4 ms
Lead Channel Pacing Threshold Pulse Width: 0.4 ms
Lead Channel Sensing Intrinsic Amplitude: 12.625 mV
Lead Channel Sensing Intrinsic Amplitude: 12.625 mV
Lead Channel Sensing Intrinsic Amplitude: 2.75 mV
Lead Channel Sensing Intrinsic Amplitude: 2.75 mV
Lead Channel Setting Pacing Amplitude: 2.5 V
Lead Channel Setting Pacing Amplitude: 2.5 V
Lead Channel Setting Pacing Pulse Width: 0.4 ms
Lead Channel Setting Sensing Sensitivity: 0.3 mV

## 2022-01-22 MED ORDER — CLONIDINE HCL 0.1 MG PO TABS: 0.1000 mg | ORAL_TABLET | Freq: Every day | ORAL | 3 refills | Status: DC

## 2022-01-22 NOTE — Progress Notes (Signed)
Cardiology Office Note:    Date:  01/22/2022   ID:  RAISTLIN GUM, DOB 09-05-1931, MRN [id number]  PCP:  Dettinger, Fransisca Kaufmann, MD  Cardiologist:  Jenne Campus, MD    Referring MD: Dettinger, Fransisca Kaufmann, MD   Chief Complaint  Patient presents with   Follow-up  Doing fine  History of Present Illness:    Manuel Kline is a 88 y.o. male with past medical history significant for coronary artery disease done many years ago, left bundle branch block, ICD present, obstructive sleep apnea, essential hypertension, longstanding diabetes, hard of hearing. Is coming today to my office for follow-up full doing well.  Denies have any chest pain tightness squeezing pressure mid chest, he just turned 89 I congratulated him for it.  He is active he does have a garden he had some tomato plants and enjoying working the garden recently he also get hearing aid and find out that the problem was his ears were plugged up with wax and that being clean and now he can hear quite well in conversations nice with him  Past Medical History:  Diagnosis Date   Adenomatous colon polyp 2006   CAD (coronary artery disease)    Calcium oxalate renal stones    Cardiomyopathy    Cataract    Cataract    Cerebral embolism with cerebral infarction 12/01/4740   Chronic systolic heart failure ([place])         CKD (chronic kidney disease) stage 4, GFR 15-29 ml/min ([place]) 11/08/2017   Claudication in peripheral vascular disease ([place]) 07/23/2020   Diabetes ([place])    Diabetic peripheral neuropathy ([place]) 01/31/2015   Dual implantable cardioverter-defibrillator in situ    01/16/2002 Dr. Lovena Le  RA lead  Guidant [id number] RV lead  Guidant [id number] Generator  Guidant Prism  09/02/2009 Generator change Medtronic [id number]  SN  [id number]      Erectile dysfunction    Erosive esophagitis    Essential hypertension 11/30/2015   Hemorrhoids    History of TIA (transient ischemic attack) 01/19/2019   HLD (hyperlipidemia)    HTN (hypertension)     Hyperlipidemia    Hypertensive heart disease without CHF 07/31/2011   ICD (implantable cardiac defibrillator) in place    ICD dual chamber in situ    Ischemic cardiomyopathy    EF 40% June 2013    Left-sided weakness 1/66/0630   Metabolic syndrome    Moderate nonproliferative diabetic retinopathy of both eyes without macular edema associated with type 2 diabetes mellitus ([place]) 02/20/2020   Morbid obesity ([place])    OSA on CPAP 11/05/2014   Osteoarthritis    Posterior vitreous detachment of both eyes 02/20/2020   Presence of automatic (implantable) cardiac defibrillator 11/08/2017   S/P CABG (coronary artery bypass graft) 11/02/2000   Sleep apnea    Stroke-like symptoms 12/26/2018   Syncope 08/06/2009   Qualifier: Diagnosis of  By: Selena Batten CMA, [place]     Type 2 diabetes, uncontrolled, with neuropathy    Has retinopahty and neuropathy     Ventricular tachycardia (paroxysmal) ([place]) 07/14/2016    Past Surgical History:  Procedure Laterality Date   ABDOMINAL EXPLORATION SURGERY     BACK SURGERY     X'3   cardiac bypass     CARDIAC DEFIBRILLATOR PLACEMENT     CARPAL TUNNEL RELEASE     X2, bilateral   CATARACT EXTRACTION     COLONOSCOPY  06/20/2012   Procedure: COLONOSCOPY;  Surgeon: Sable Feil, MD;  Location:  [facility];  Service: Endoscopy;  Laterality: N/A;   DOPPLER ECHOCARDIOGRAPHY  2003   EP IMPLANTABLE DEVICE N/A 07/14/2016   Procedure: ICD Generator Changeout;  Surgeon: Evans Lance, MD;  Location: [facility];  Service: Cardiovascular;  Laterality: N/A;   ESOPHAGOGASTRODUODENOSCOPY  06/20/2012   Procedure: ESOPHAGOGASTRODUODENOSCOPY (EGD);  Surgeon: Sable Feil, MD;  Location: [facility];  Service: Endoscopy;  Laterality: N/A;   EYE SURGERY     LAPAROTOMY     RETINOPATHY SURGERY Bilateral    rotator cuff surgery     left    Current Medications: Current Meds  Medication Sig   ACCU-CHEK GUIDE test strip by Other route as needed for other.   allopurinol  (ZYLOPRIM) 300 MG tablet Take 1/2 (one-half) tablet by mouth once daily (Patient taking differently: Take 300 mg by mouth daily.)   benazepril (LOTENSIN) 40 MG tablet Take 1 tablet (40 mg total) by mouth daily.   cloNIDine (CATAPRES) 0.1 MG tablet Take 1 tablet by mouth twice daily (Patient taking differently: Take 0.1 mg by mouth 2 (two) times daily.)   clopidogrel (PLAVIX) 75 MG tablet Take 1 tablet by mouth once daily (Patient taking differently: Take 75 mg by mouth daily.)   Cyanocobalamin (VITAMIN B 12 PO) Take 1,000 mcg by mouth daily.   dapagliflozin propanediol (FARXIGA) 10 MG TABS tablet Take 1 tablet (10 mg total) by mouth daily before breakfast.   Dulaglutide (TRULICITY) 3 UV/2.5DG SOPN Inject 3 mg as directed once a week. [name] patient assistance program   DULoxetine (CYMBALTA) 30 MG capsule Take 1 capsule by mouth once daily (Patient taking differently: Take 30 mg by mouth daily.)   fluticasone (CUTIVATE) 0.05 % cream Apply 1 application. topically daily.   furosemide (LASIX) 40 MG tablet Take 1 tablet by mouth twice daily (Patient taking differently: Take 40 mg by mouth 2 (two) times daily.)   gabapentin (NEURONTIN) 300 MG capsule Take 1 capsule by mouth twice daily (Patient taking differently: Take 300 mg by mouth 2 (two) times daily.)   Insulin Glargine (BASAGLAR KWIKPEN) 100 UNIT/ML SOPN Inject 0.25 mLs (25 Units total) into the skin daily.   insulin regular (HUMULIN R) 100 units/mL injection Inject 0.3 mLs (30 Units total) into the skin 2 (two) times daily before a meal.   metoprolol succinate (TOPROL-XL) 50 MG 24 hr tablet TAKE 1 TABLET BY MOUTH ONCE DAILY TAKE  WITH  OR  IMMEDIATELY  FOLLOWING  A  MEAL (Patient taking differently: Take 50 mg by mouth daily.)   Multiple Vitamin (MULTIVITAMIN ADULT PO) Take 1 tablet by mouth daily.   nitroGLYCERIN (NITROSTAT) 0.4 MG SL tablet Place 1 tablet (0.4 mg total) under the tongue every 5 (five) minutes as needed for chest pain.    Omega-3 Fatty Acids (FISH OIL) 1000 MG CPDR Take 1 tablet by mouth daily.   polyethylene glycol powder (GLYCOLAX/MIRALAX) powder Take 17 g by mouth 2 (two) times daily as needed. (Patient taking differently: Take 17 g by mouth 2 (two) times daily as needed for mild constipation or moderate constipation.)   rosuvastatin (CRESTOR) 5 MG tablet Take 1 tablet (5 mg total) by mouth daily.   vitamin C (ASCORBIC ACID) 500 MG tablet Take 500 mg by mouth daily.     Allergies:   Patient has no known allergies.   Social History   Socioeconomic History   Marital status: Married    Spouse name: [name]   Number of children: 4   Years of  education: 11   Highest education level: 11th grade  Occupational History   Occupation: Retired from [employer]: RETIRED  Tobacco Use   Smoking status: Never   Smokeless tobacco: Never  Vaping Use   Vaping Use: Never used  Substance and Sexual Activity   Alcohol use: No    Alcohol/week: 0.0 standard drinks   Drug use: No   Sexual activity: Yes  Other Topics Concern   Not on file  Social History Narrative   Lives at home with wife   Right handed, Married, 4 kids from previous marriage.  Retired.     Caffeine 1 cup daily.   Social Determinants of Health   Financial Resource Strain: Low Risk    Difficulty of Paying Living Expenses: Not hard at all  Food Insecurity: No Food Insecurity   Worried About Charity fundraiser in the Last Year: Never true   [place] in the Last Year: Never true  Transportation Needs: No Transportation Needs   Lack of Transportation (Medical): No   Lack of Transportation (Non-Medical): No  Physical Activity: Insufficiently Active   Days of Exercise per Week: 5 days   Minutes of Exercise per Session: 20 min  Stress: No Stress Concern Present   Feeling of Stress : Not at all  Social Connections: Socially Integrated   Frequency of Communication with Friends and Family: More than three times a week    Frequency of Social Gatherings with Friends and Family: More than three times a week   Attends Religious Services: More than 4 times per year   Active Member of [employer] or Organizations: Yes   Attends Music therapist: More than 4 times per year   Marital Status: Married     Family History: The patient's family history includes Cancer in his brother; Diabetes in his brother, brother, brother, mother, sister, sister, sister, sister, and sister; Heart disease in his brother, father, and mother; Kidney disease in his sister; Throat cancer in his paternal uncle. ROS:   Please see the history of present illness.    All 14 point review of systems negative except as described per history of present illness  EKGs/Labs/Other Studies Reviewed:      Recent Labs: 01/07/2022: ALT 22; BUN 46; Creatinine, Ser 2.38; Hemoglobin 13.4; Platelets 209; Potassium 4.5; Sodium 138  Recent Lipid Panel    Component Value Date/Time   CHOL 129 01/07/2022 1325   TRIG 260 (H) 01/07/2022 1325   HDL 34 (L) 01/07/2022 1325   CHOLHDL 3.8 01/07/2022 1325   CHOLHDL 3.4 12/26/2018 0558   VLDL 36 12/26/2018 0558   LDLCALC 54 01/07/2022 1325    Physical Exam:    VS:  BP 98/66 (BP Location: Left Arm, Patient Position: Sitting)   Pulse 65   Ht '5\' 4"'$  (1.626 m)   Wt 230 lb (104.3 kg)   SpO2 95%   BMI 39.48 kg/m     Wt Readings from Last 3 Encounters:  01/22/22 230 lb (104.3 kg)  01/14/22 229 lb (103.9 kg)  01/07/22 229 lb (103.9 kg)     GEN:  Well nourished, well developed in no acute distress HEENT: Normal NECK: No JVD; No carotid bruits LYMPHATICS: No lymphadenopathy CARDIAC: RRR, no murmurs, no rubs, no gallops RESPIRATORY:  Clear to auscultation without rales, wheezing or rhonchi  ABDOMEN: Soft, non-tender, non-distended MUSCULOSKELETAL:  No edema; No deformity  SKIN: Warm and dry LOWER EXTREMITIES: no swelling NEUROLOGIC:  Alert and  oriented x 3 PSYCHIATRIC:  Normal affect    ASSESSMENT:    1. Ischemic cardiomyopathy   2. Chronic systolic heart failure ([place])   3. Coronary artery disease involving native coronary artery of native heart without angina pectoris   4. S/P CABG (coronary artery bypass graft)    PLAN:    In order of problems listed above:  Coronary artery disease doing well from that point review asymptomatic on antiplatelet therapy which I will continue. Ischemic cardiomyopathy with ejection fraction 4045%.  Continue present management which include beta-blocker as well as ACE inhibitor.  Stable for years. Dyslipidemia I did review K PN which show me LDL 54 HDL 34.  He is on Crestor 5 mg that is what he is still able to tolerate cholesterol well controlled continue present management We did talk about need to exercise on the regular basis which she already does. Essential hypertension blood pressure seems to be low and repeatedly low including visiting primary care physician I will ask him to cut clonidine to only 1 tablet a day   Medication Adjustments/Labs and Tests Ordered: Current medicines are reviewed at length with the patient today.  Concerns regarding medicines are outlined above.  No orders of the defined types were placed in this encounter.  Medication changes: No orders of the defined types were placed in this encounter.   Signed, Park Liter, MD, [facility] 01/22/2022 8:57 AM    [place]

## 2022-01-22 NOTE — Patient Instructions (Addendum)
Medication Instructions:  Your physician has recommended you make the following change in your medication: Decrease Clonidine 0.'1mg'$  to 1 tablet daily by mouth   Lab Work: None Ordered If you have labs (blood work) drawn today and your tests are completely normal, you will receive your results only by: [place] (if you have MyChart) OR A paper copy in the mail If you have any lab test that is abnormal or we need to change your treatment, we will call you to review the results.   Testing/Procedures: None Ordered   Follow-Up: At [facility], you and your health needs are our priority.  As part of our continuing mission to provide you with exceptional heart care, we have created designated Provider Care Teams.  These Care Teams include your primary Cardiologist (physician) and Advanced Practice Providers (APPs -  Physician Assistants and Nurse Practitioners) who all work together to provide you with the care you need, when you need it.  We recommend signing up for the patient portal called "MyChart".  Sign up information is provided on this After Visit Summary.  MyChart is used to connect with patients for Virtual Visits (Telemedicine).  Patients are able to view lab/test results, encounter notes, upcoming appointments, etc.  Non-urgent messages can be sent to your provider as well.   To learn more about what you can do with MyChart, go to [email or web address].    Your next appointment:   6 month(s)  The format for your next appointment:   In Person  Provider:   Jenne Campus, MD    Other Instructions NA

## 2022-01-28 DIAGNOSIS — N184 Chronic kidney disease, stage 4 (severe): Secondary | ICD-10-CM

## 2022-01-28 DIAGNOSIS — E1169 Type 2 diabetes mellitus with other specified complication: Secondary | ICD-10-CM

## 2022-01-28 DIAGNOSIS — Z794 Long term (current) use of insulin: Secondary | ICD-10-CM

## 2022-02-03 ENCOUNTER — Encounter: Payer: Self-pay | Admitting: Family Medicine

## 2022-02-03 ENCOUNTER — Encounter: Payer: Self-pay | Admitting: Podiatry

## 2022-02-03 ENCOUNTER — Ambulatory Visit (INDEPENDENT_AMBULATORY_CARE_PROVIDER_SITE_OTHER): Payer: Medicare Other | Admitting: Podiatry

## 2022-02-03 ENCOUNTER — Other Ambulatory Visit: Payer: Self-pay | Admitting: *Deleted

## 2022-02-03 ENCOUNTER — Ambulatory Visit (INDEPENDENT_AMBULATORY_CARE_PROVIDER_SITE_OTHER): Payer: Medicare Other

## 2022-02-03 DIAGNOSIS — D689 Coagulation defect, unspecified: Secondary | ICD-10-CM

## 2022-02-03 DIAGNOSIS — M79675 Pain in left toe(s): Secondary | ICD-10-CM | POA: Diagnosis not present

## 2022-02-03 DIAGNOSIS — E1142 Type 2 diabetes mellitus with diabetic polyneuropathy: Secondary | ICD-10-CM | POA: Diagnosis not present

## 2022-02-03 DIAGNOSIS — M79674 Pain in right toe(s): Secondary | ICD-10-CM | POA: Diagnosis not present

## 2022-02-03 DIAGNOSIS — M216X2 Other acquired deformities of left foot: Secondary | ICD-10-CM

## 2022-02-03 DIAGNOSIS — M216X9 Other acquired deformities of unspecified foot: Secondary | ICD-10-CM | POA: Diagnosis not present

## 2022-02-03 DIAGNOSIS — B351 Tinea unguium: Secondary | ICD-10-CM

## 2022-02-03 DIAGNOSIS — E1169 Type 2 diabetes mellitus with other specified complication: Secondary | ICD-10-CM | POA: Diagnosis not present

## 2022-02-03 DIAGNOSIS — L84 Corns and callosities: Secondary | ICD-10-CM

## 2022-02-03 DIAGNOSIS — M216X1 Other acquired deformities of right foot: Secondary | ICD-10-CM

## 2022-02-03 NOTE — Progress Notes (Signed)
SITUATION Reason for Visit: Fitting of Diabetic Shoes & Insoles Patient / Caregiver Report:  Patient is satisfied with fit and function of shoes and insoles.  OBJECTIVE DATA: Patient History / Diagnosis:     ICD-10-CM   1. Type 2 diabetes mellitus with other specified complication, without long-term current use of insulin (HCC)  E11.69     2. Callus of foot  L84       Change in Status:   None  ACTIONS PERFORMED: In-Person Delivery, patient was fit with: - 1x pair A5500 PDAC approved prefabricated Diabetic Shoes: Apex A4000M 9XW - 3x pair [id number] PDAC approved vacuum formed custom diabetic insoles; [employer]: [id number]  Shoes and insoles were verified for structural integrity and safety. Patient wore shoes and insoles in office. Skin was inspected and free of areas of concern after wearing shoes and inserts. Shoes and inserts fit properly. Patient / Caregiver provided with ferbal instruction and demonstration regarding donning, doffing, wear, care, proper fit, function, purpose, cleaning, and use of shoes and insoles ' and in all related precautions and risks and benefits regarding shoes and insoles. Patient / Caregiver was instructed to wear properly fitting socks with shoes at all times. Patient was also provided with verbal instruction regarding how to report any failures or malfunctions of shoes or inserts, and necessary follow up care. Patient / Caregiver was also instructed to contact physician regarding change in status that may affect function of shoes and inserts.   Patient / Caregiver verbalized undersatnding of instruction provided. Patient / Caregiver demonstrated independence with proper donning and doffing of shoes and inserts.  PLAN Patient to follow with treating physician as recommended. Plan of care was discussed with and agreed upon by patient and/or caregiver. All questions were answered and concerns addressed.

## 2022-02-03 NOTE — Progress Notes (Signed)
This patient returns to my office for at risk foot care.  This patient requires this care by a professional since this patient will be at risk due to having diabetic neuropathy claudication in PVD and CKD.  This patient is unable to cut nails himself since the patient cannot reach his nails.These nails are painful walking and wearing shoes.    This patient presents for at risk foot care today.  General Appearance  Alert, conversant and in no acute stress.  Vascular  Dorsalis pedis and posterior tibial  pulses are weakly  palpable  bilaterally.  Capillary return is within normal limits  bilaterally  Cold feet  bilaterally.  Absent digital hair noted.  Neurologic  Senn-Weinstein monofilament wire test diminished  bilaterally. Muscle power within normal limits bilaterally.  Nails Thick disfigured discolored nails with subungual debris  from hallux to fifth toes bilaterally. No evidence of bacterial infection or drainage bilaterally.  Orthopedic  No limitations of motion  feet .  No crepitus or effusions noted.  No bony pathology or digital deformities noted.  Plantar flexed fifth metatarsal head left foot.    Skin  normotropic skin with no porokeratosis noted bilaterally.  No signs of infections or ulcers noted.     Onychomycosis  Pain in right toes  Pain in left toes    Consent was obtained for treatment procedures.   Mechanical debridement of nails 1-5  bilaterally performed with a nail nipper.  Filed with dremel without incident.    Return office visit  3 months                    Told patient to return for periodic foot care and evaluation due to potential at risk complications.   Gardiner Barefoot DPM

## 2022-02-03 NOTE — Patient Outreach (Signed)
[facility]) Care Management  02/03/2022  Manuel Kline [birth year] [id number]  RN Health Coach attempted follow up outreach call to patient.  Patient was unavailable. At foot Dr per wife.  HIPPA compliance  message left with wife. Plan: RN will call patient again within 30 days.  [place] Care Management [phone]

## 2022-02-03 NOTE — Progress Notes (Signed)
Remote ICD transmission.   

## 2022-02-05 ENCOUNTER — Encounter: Payer: Self-pay | Admitting: Pharmacist

## 2022-02-05 ENCOUNTER — Ambulatory Visit (INDEPENDENT_AMBULATORY_CARE_PROVIDER_SITE_OTHER): Payer: Medicare Other | Admitting: Pharmacist

## 2022-02-05 VITALS — BP 118/67 | HR 69

## 2022-02-05 DIAGNOSIS — N184 Chronic kidney disease, stage 4 (severe): Secondary | ICD-10-CM

## 2022-02-05 DIAGNOSIS — E1169 Type 2 diabetes mellitus with other specified complication: Secondary | ICD-10-CM

## 2022-02-05 MED ORDER — "INSULIN SYRINGE-NEEDLE U-100 31G X 5/16"" 1 ML MISC": 11 refills | Status: DC

## 2022-02-05 NOTE — Progress Notes (Signed)
Chronic Care Management Pharmacy Note  02/05/2022 Name:  EBENEZER MCCASKEY MRN:  [id number] DOB:  02-17-1932  Summary:  Diabetes: Goal on Track (progressing): YES. Uncontrolled-A1C 7.9%-->7.7% (okay for 7 yoM), GFR 37-->30-->26;  Current treatment: meds were initially chosen prior to pharmd appt due to cost/patient comfort; we are working to optimize them [place]  Denies personal and family history of Medullary thyroid cancer (MTC) Continue Basaglar 25 units once daily (patient has been taking twice daily) CONTINUE FARXIGA 10MG DAILY-GFR 25-30 (sees nephro) WILL ENROLL IN [place] PATIENT ASSISTANCE PROGRAM--application send today (escribed to [facility]) Stable on samples CONTINUE Humulin R to 20 units twice daily with meals   Patient enrolled in PAP with [name] for TRULICITY, [place] R RXS ESCRIBED [facility] [place]; ships 35-monthsupplies to patient's home every quarter(due to back orders unlikely to ship more than 1 month at a time) Current glucose readings: fasting glucose: <200, post prandial glucose: 200s Denies hypoglycemic/hyperglycemic symptoms Discussed meal planning options and Plate method for healthy eating Avoid sugary drinks and desserts Incorporate balanced protein, non starchy veggies, 1 serving of carbohydrate with each meal Increase water intake Increase physical activity as able Current exercise: patient unable due to physical condition Recommended increase glp1; work to optimize insulin regimen and POs for T2DM Assessed patient finances. Re enrollment and dose changes sent to lilly cares patient assistance program--will follow BP checked today--118/76 (currently holding clonidine d/t hypotension)--encouraged patient to call if BP changes  Subjective: Manuel CONERYis an 88y.o. year old male who is a primary patient of Dettinger, JFransisca Kaufmann MD.  The CCM team was consulted for assistance with disease  management and care coordination needs.    Engaged with patient face to face for follow up visit in response to provider referral for pharmacy case management and/or care coordination services.   Consent to Services:  The patient was given information about Chronic Care Management services, agreed to services, and gave verbal consent prior to initiation of services.  Please see initial visit note for detailed documentation.   Patient Care Team: Dettinger, JFransisca Kaufmann MD as PCP - General (Family Medicine) TEvans Lance MD as PCP - Electrophysiology (Cardiology) KPark Liter MD as PCP - Cardiology (Cardiology) GRegenia Skeeter MD as Consulting Physician (Internal Medicine) Regal, NTamala Fothergill DPM as Consulting Physician (Podiatry) RZadie RhineGClent Demark MD as Consulting Physician (Ophthalmology) TEvans Lance MD as Consulting Physician (Cardiology) MGardiner Barefoot DPM as Consulting Physician (Podiatry) Pleasant, FEppie Gibson RN as TToccopolaManagement Dolan Xia, JRoyce Macadamia [facility](Pharmacist) Dohmeier, CAsencion Partridge MD as Consulting Physician (Neurology)   Objective:  Lab Results  Component Value Date   CREATININE 2.38 (H) 01/07/2022   CREATININE 2.11 (H) 10/02/2021   CREATININE 1.96 (H) 07/02/2021    Lab Results  Component Value Date   HGBA1C 7.7 (H) 01/07/2022   Last diabetic Eye exam:  Lab Results  Component Value Date/Time   HMDIABEYEEXA Retinopathy (A) 03/06/2021 12:00 AM    Last diabetic Foot exam: No results found for: "HMDIABFOOTEX"      Component Value Date/Time   CHOL 129 01/07/2022 1325   TRIG 260 (H) 01/07/2022 1325   HDL 34 (L) 01/07/2022 1325   CHOLHDL 3.8 01/07/2022 1325   CHOLHDL 3.4 12/26/2018 0558   VLDL 36 12/26/2018 0558   LDLCALC 54 01/07/2022 1325       Latest Ref Rng & Units 01/07/2022  1:25 PM 10/02/2021    9:12 AM 07/02/2021    1:34 PM  Hepatic Function  Total Protein 6.0 - 8.5 g/dL 6.9  6.9  6.9   Albumin 3.6 - 4.6 g/dL 4.3   4.6  4.4   AST 0 - 40 IU/L 33  26  30   ALT 0 - 44 IU/L 22  26  29    Alk Phosphatase 44 - 121 IU/L 97  122  117   Total Bilirubin 0.0 - 1.2 mg/dL 0.5  0.6  0.6     Lab Results  Component Value Date/Time   TSH 1.421 12/25/2018 10:40 PM       Latest Ref Rng & Units 01/07/2022    1:25 PM 10/02/2021    9:12 AM 07/02/2021    1:34 PM  CBC  WBC 3.4 - 10.8 x10E3/uL 7.9  7.3  7.7   Hemoglobin 13.0 - 17.7 g/dL 13.4  13.9  13.9   Hematocrit 37.5 - 51.0 % 37.9  41.1  40.0   Platelets 150 - 450 x10E3/uL 209  182  211     No results found for: "VD25OH"  Clinical ASCVD: Yes  The ASCVD Risk score (Arnett DK, et al., 2019) failed to calculate for the following reasons:   The 2019 ASCVD risk score is only valid for ages 72 to 54   The patient has a prior MI or stroke diagnosis    Other: (CHADS2VASc if Afib, PHQ9 if depression, MMRC or CAT for COPD, ACT, DEXA)  Social History   Tobacco Use  Smoking Status Never  Smokeless Tobacco Never   BP Readings from Last 3 Encounters:  02/05/22 118/67  01/22/22 98/66  01/14/22 129/70   Pulse Readings from Last 3 Encounters:  02/05/22 69  01/22/22 65  01/14/22 81   Wt Readings from Last 3 Encounters:  01/22/22 230 lb (104.3 kg)  01/14/22 229 lb (103.9 kg)  01/07/22 229 lb (103.9 kg)    Assessment: Review of patient past medical history, allergies, medications, health status, including review of consultants reports, laboratory and other test data, was performed as part of comprehensive evaluation and provision of chronic care management services.   SDOH:  (Social Determinants of Health) assessments and interventions performed:    CCM Care Plan  No Known Allergies  Medications Reviewed Today     Reviewed by Lavera Guise, [facility] (Pharmacist) on 02/05/22 at 1255  Med List Status: <None>   Medication Order Taking? Sig Documenting Provider Last Dose Status Informant  ACCU-CHEK GUIDE test strip [id number]  by Other route as needed for other.  [provider]  Active   allopurinol (ZYLOPRIM) 300 MG tablet [id number]  Take 1/2 (one-half) tablet by mouth once daily  Patient taking differently: Take 300 mg by mouth daily.   Dettinger, Fransisca Kaufmann, MD  Active   benazepril (LOTENSIN) 40 MG tablet [id number]  Take 1 tablet (40 mg total) by mouth daily. Dettinger, Fransisca Kaufmann, MD  Active   cloNIDine (CATAPRES) 0.1 MG tablet [id number]  Take 1 tablet (0.1 mg total) by mouth daily. Park Liter, MD  Active   clopidogrel (PLAVIX) 75 MG tablet [id number]  Take 1 tablet by mouth once daily  Patient taking differently: Take 75 mg by mouth daily.   Dettinger, Fransisca Kaufmann, MD  Active   Cyanocobalamin (VITAMIN B 12 PO) [id number]  Take 1,000 mcg by mouth daily. [provider]  Active Spouse/Significant Other  dapagliflozin propanediol (FARXIGA) 10 MG TABS tablet [id number]  Take 1 tablet (10 mg total) by mouth daily before breakfast. Dettinger, Fransisca Kaufmann, MD  Active            Med Note Parthenia [name] Feb 05, 2022 12:55 PM) Via [place] patient assistance program   Dulaglutide (TRULICITY) 3 XI/5.0TU SOPN [id number]  Inject 3 mg as directed once a week. [name] patient assistance program Dettinger, Fransisca Kaufmann, MD  Active            Med Note Blanca Friend, Omer Jack Oct 05, 2021 11:21 PM) [place] patient assistance program    DULoxetine (CYMBALTA) 30 MG capsule [id number]  Take 1 capsule by mouth once daily  Patient taking differently: Take 30 mg by mouth daily.   Dettinger, Fransisca Kaufmann, MD  Active   fluticasone (CUTIVATE) 0.05 % cream [id number]  Apply 1 application. topically daily. [provider]  Active   furosemide (LASIX) 40 MG tablet [id number]  Take 1 tablet by mouth twice daily  Patient taking differently: Take 40 mg by mouth 2 (two) times daily.   Dettinger, Fransisca Kaufmann, MD  Active   gabapentin (NEURONTIN) 300 MG capsule [id number]  Take 1 capsule by mouth twice daily  Patient taking differently: Take 300 mg by mouth  2 (two) times daily.   Dettinger, Fransisca Kaufmann, MD  Active   Insulin [facility] KWIKPEN) 100 UNIT/ML SOPN [id number]  Inject 0.25 mLs (25 Units total) into the skin daily. Dettinger, Fransisca Kaufmann, MD  Active            Med Note Blanca Friend, Omer Jack Oct 05, 2021 11:21 PM) [place] patient assistance program    insulin regular (HUMULIN R) 100 units/mL injection [id number]  Inject 0.3 mLs (30 Units total) into the skin 2 (two) times daily before a meal. Dettinger, Fransisca Kaufmann, MD  Active            Med Note Britton, AMY E   Wed Dec 10, 2021  9:19 AM) Needs patient assistance  metoprolol succinate (TOPROL-XL) 50 MG 24 hr tablet [id number]  TAKE 1 TABLET BY MOUTH ONCE DAILY TAKE  WITH  OR  IMMEDIATELY  FOLLOWING  A  MEAL  Patient taking differently: Take 50 mg by mouth daily.   Dettinger, Fransisca Kaufmann, MD  Active   Multiple Vitamin (MULTIVITAMIN ADULT PO) [id number]  Take 1 tablet by mouth daily. [provider]  Active   nitroGLYCERIN (NITROSTAT) 0.4 MG SL tablet [id number]  Place 1 tablet (0.4 mg total) under the tongue every 5 (five) minutes as needed for chest pain. Dettinger, Fransisca Kaufmann, MD  Active   Omega-3 Fatty Acids (FISH OIL) 1000 MG CPDR [id number]  Take 1 tablet by mouth daily. [provider]  Active Spouse/Significant Other  polyethylene glycol powder (GLYCOLAX/MIRALAX) powder [id number]  Take 17 g by mouth 2 (two) times daily as needed.  Patient taking differently: Take 17 g by mouth 2 (two) times daily as needed for mild constipation or moderate constipation.   Eustaquio Maize, MD  Active   rosuvastatin (CRESTOR) 5 MG tablet [id number]  Take 1 tablet (5 mg total) by mouth daily. Dettinger, Fransisca Kaufmann, MD  Active   vitamin C (ASCORBIC ACID) 500 MG tablet [id number]  Take 500 mg by mouth daily. [provider]  Active             Patient Active Problem List   Diagnosis Date Noted   Callus 10/29/2021  Glaucoma suspect with open angle 04/02/2021   Pseudophakia  of both eyes 08/20/2020   Claudication in peripheral vascular disease ([place]) 07/23/2020   Sleep apnea    Osteoarthritis    Metabolic syndrome    Erosive esophagitis    Erectile dysfunction    Calcium oxalate renal stones    Moderate nonproliferative diabetic retinopathy of both eyes without macular edema associated with type 2 diabetes mellitus ([place]) 02/20/2020   Posterior vitreous detachment of both eyes 02/20/2020   History of TIA (transient ischemic attack) 01/19/2019   Cerebral embolism with cerebral infarction 12/26/2018   CKD (chronic kidney disease) stage 4, GFR 15-29 ml/min (HCC) 11/08/2017   Presence of automatic (implantable) cardiac defibrillator 11/08/2017   Ventricular tachycardia (paroxysmal) ([place]) 07/14/2016   Essential hypertension 11/30/2015   Diabetic peripheral neuropathy ([place]) 01/31/2015   OSA on CPAP 11/05/2014   CAD (coronary artery disease)    Dual implantable cardioverter-defibrillator in situ    Hyperlipidemia    Type 2 diabetes mellitus with other specified complication ([place])    Morbid obesity ([place])    Ischemic cardiomyopathy    Chronic systolic heart failure ([place])    S/P CABG (coronary artery bypass graft) 11/02/2000    Immunization History  Administered Date(s) Administered   Fluad Quad(high Dose 65+) 06/26/2019, 05/22/2020, 06/16/2021   Influenza, High Dose Seasonal PF 06/14/2017, 07/19/2018   Influenza,inj,Quad PF,6+ Mos 06/12/2014, 06/24/2015, 06/17/2016   Moderna Sars-Covid-2 Vaccination 09/12/2019, 10/13/2019, 07/04/2020   Pneumococcal Conjugate-13 03/01/2015   Pneumococcal Polysaccharide-23 03/16/2016   Tdap 02/16/2020   Zoster Recombinat (Shingrix) 02/17/2020, 09/17/2020    Conditions to be addressed/monitored: DMII and CKD Stage 3b  Care Plan : PHARMD MEDICATION MANAGEMENT  Updates made by Lavera Guise, [place] since 02/05/2022 12:00 AM     Problem: CHL AMB "PATIENT-SPECIFIC PROBLEM"      Long-Range Goal: T2DM ,HLD PHARMD GOAL    Recent Progress: Not on track  Priority: High  Note:   Current Barriers:  Unable to independently afford treatment regimen Unable to maintain control of T2DM Suboptimal therapeutic regimen for T2DM  Pharmacist Clinical Goal(s):  patient will verbalize ability to afford treatment regimen achieve control of T2DM as evidenced by GOAL A1C<7% adhere to plan to optimize therapeutic regimen for T2DM as evidenced by report of adherence to recommended medication management changes through collaboration with PharmD and provider.   Interventions: 1:1 collaboration with Dettinger, Fransisca Kaufmann, MD regarding development and update of comprehensive plan of care as evidenced by provider attestation and co-signature Inter-disciplinary care team collaboration (see longitudinal plan of care) Comprehensive medication review performed; medication list updated in electronic medical record  Diabetes: Goal on Track (progressing): YES. Uncontrolled-A1C 7.9%-->7.7% (okay for 72 yoM), GFR 37-->30-->26;  Current treatment: meds were initially chosen prior to pharmd appt due to cost/patient comfort; we are working to optimize them [place]  Denies personal and family history of Medullary thyroid cancer (MTC) Continue Basaglar 25 units once daily (patient has been taking twice daily) CONTINUE FARXIGA 10MG DAILY-GFR 25-30 (sees nephro) WILL ENROLL IN [place] PATIENT ASSISTANCE PROGRAM--application send today (escribed to [facility]) Stable on samples CONTINUE Humulin R to 20 units twice daily with meals   Patient enrolled in PAP with [name] for TRULICITY, [place] [facility] [place]; ships 73-monthsupplies to patient's home every quarter(due to back orders unlikely to ship more than 1 month at a time) Current glucose readings: fasting glucose: <200, post prandial  glucose: 200s Denies hypoglycemic/hyperglycemic symptoms Discussed meal  planning options and Plate method for healthy eating Avoid sugary drinks and desserts Incorporate balanced protein, non starchy veggies, 1 serving of carbohydrate with each meal Increase water intake Increase physical activity as able Current exercise: patient unable due to physical condition Recommended increase glp1; work to optimize insulin regimen and POs for T2DM Assessed patient finances. Re enrollment and dose changes sent to lilly cares patient assistance program--will follow BP checked today--118/76 (currently holding clonidine d/t hypotension)--encouraged patient to call if BP changes  Patient Goals/Self-Care Activities patient will:  - take medications as prescribed as evidenced by patient report and record review check glucose daily (3 times) or when symptomatic, document, and provide at future appointments collaborate with provider on medication access solutions engage in dietary modifications by   FOLLOWING A HEART HEALTHY DIET/HEALTHY PLATE METHOD       Medication Assistance:  Trulicity, basaglar, humulin obtained through lilly cares medication assistance program.  Enrollment ends 07/30/2022  Patient's preferred pharmacy is:  [facility] [address], [address] [place] [address] Phone: [phone] Fax: [phone]  [place], [address] [place] [address] Phone: [phone] Fax: [place], [place] [place] [address] Phone: [phone] Fax: [phone]   Plan: Telephone follow up appointment with care management team member scheduled for:  3 months  Regina Eck, PharmD, BCPS Clinical Pharmacist, [place]  II Phone [phone]

## 2022-02-05 NOTE — Patient Instructions (Signed)
Visit Information  Following are the goals we discussed today:  Current Barriers:  Unable to independently afford treatment regimen Unable to maintain control of T2DM Suboptimal therapeutic regimen for T2DM  Pharmacist Clinical Goal(s):  patient will verbalize ability to afford treatment regimen achieve control of T2DM as evidenced by GOAL A1C<7% adhere to plan to optimize therapeutic regimen for T2DM as evidenced by report of adherence to recommended medication management changes through collaboration with PharmD and provider.   Interventions: 1:1 collaboration with Dettinger, Fransisca Kaufmann, MD regarding development and update of comprehensive plan of care as evidenced by provider attestation and co-signature Inter-disciplinary care team collaboration (see longitudinal plan of care) Comprehensive medication review performed; medication list updated in electronic medical record  Diabetes: Goal on Track (progressing): YES. Uncontrolled-A1C 7.9%-->7.7% (okay for 9 yoM), GFR 37-->30-->26;  Current treatment: meds were initially chosen prior to pharmd appt due to cost/patient comfort; we are working to optimize them CONTINUE TRULICITY TO '3MG'$  SQ WEEKLY  Denies personal and family history of Medullary thyroid cancer (MTC) Continue Basaglar 25 units once daily (patient has been taking twice daily) CONTINUE FARXIGA '10MG'$  DAILY-GFR 25-30 (sees nephro) WILL ENROLL IN [place]&ME PATIENT ASSISTANCE PROGRAM--application send today (escribed to [facility]) Stable on samples CONTINUE Humulin R to 20 units twice daily with meals   Patient enrolled in PAP with [name] for TRULICITY, [place] R RXS ESCRIBED TO [facility] [place]; ships 39-monthsupplies to patient's home every quarter(due to back orders unlikely to ship more than 1 month at a time) Current glucose readings: fasting glucose: <200, post prandial glucose: 200s Denies hypoglycemic/hyperglycemic symptoms Discussed  meal planning options and Plate method for healthy eating Avoid sugary drinks and desserts Incorporate balanced protein, non starchy veggies, 1 serving of carbohydrate with each meal Increase water intake Increase physical activity as able Current exercise: patient unable due to physical condition Recommended increase glp1; work to optimize insulin regimen and POs for T2DM Assessed patient finances. Re enrollment and dose changes sent to lilly cares patient assistance program--will follow BP checked today--118/76 (currently holding clonidine d/t hypotension)--encouraged patient to call if BP changes  Patient Goals/Self-Care Activities patient will:  - take medications as prescribed as evidenced by patient report and record review check glucose daily (3 times) or when symptomatic, document, and provide at future appointments collaborate with provider on medication access solutions engage in dietary modifications by FOLLOWING A HEART HEALTHY DIET/HEALTHY PLATE METHOD    Plan: Face to Face appointment with care management team member scheduled for: 3 months  Signature JRegina Eck PharmD, BCPS Clinical Pharmacist, [place] Phone [phone]  Please call the care guide team at [phone] you need to cancel or reschedule your appointment.   The patient verbalized understanding of instructions, educational materials, and care plan provided today and DECLINED offer to receive copy of patient instructions, educational materials, and care plan.

## 2022-02-17 DIAGNOSIS — G4733 Obstructive sleep apnea (adult) (pediatric): Secondary | ICD-10-CM | POA: Diagnosis not present

## 2022-02-19 DIAGNOSIS — G4733 Obstructive sleep apnea (adult) (pediatric): Secondary | ICD-10-CM | POA: Diagnosis not present

## 2022-02-27 DIAGNOSIS — Z794 Long term (current) use of insulin: Secondary | ICD-10-CM

## 2022-02-27 DIAGNOSIS — E1169 Type 2 diabetes mellitus with other specified complication: Secondary | ICD-10-CM

## 2022-02-27 DIAGNOSIS — N184 Chronic kidney disease, stage 4 (severe): Secondary | ICD-10-CM

## 2022-03-02 ENCOUNTER — Other Ambulatory Visit: Payer: Self-pay | Admitting: *Deleted

## 2022-03-02 DIAGNOSIS — N184 Chronic kidney disease, stage 4 (severe): Secondary | ICD-10-CM | POA: Diagnosis not present

## 2022-03-02 NOTE — Patient Outreach (Signed)
[facility]) Care Management  03/02/2022  AASIR DAIGLER 01-06-1932 [id number]   [phone] [place] telephone call to patient.  Patient was not available. [facility]. Patient had last checked his blood sugar on June 16 th. Per documentation patient had not been checking his blood sugars as per ordered. RN will call back and discuss monitoring with patient.  Plan: RN will follow up [phone]  [place] Management [phone]

## 2022-03-06 ENCOUNTER — Other Ambulatory Visit: Payer: Self-pay | Admitting: *Deleted

## 2022-03-06 NOTE — Patient Instructions (Signed)
Visit Information  Thank you for taking time to visit with me today. Please don't hesitate to contact me if I can be of assistance to you before our next scheduled telephone appointment.  Following are the goals we discussed today:  Current Barriers:  Knowledge Deficits related to plan of care for management of DMII   RNCM Clinical Goal(s):  Patient will verbalize understanding of plan for management of DMII as evidenced by continuation of monitoring blood sugars and adhering to diabetic diet and low cholesterol diet through collaboration with RN Care manager, provider, and care team.   Interventions: Inter-disciplinary care team collaboration (see longitudinal plan of care) Evaluation of current treatment plan related to  self management and patient's adherence to plan as established by provider   Patient Goals/Self-Care Activities: Take medications as prescribed   Attend all scheduled provider appointments Call pharmacy for medication refills 3-7 days in advance of running out of medications Attend church or other social activities Perform all self care activities independently  Call provider office for new concerns or questions  schedule appointment with eye doctor check blood sugar at prescribed times: once daily check feet daily for cuts, sores or redness take the blood sugar meter to all doctor visits trim toenails straight across drink 6 to 8 glasses of water each day manage portion size Bake foods and cook with olive oil when possible   [id number] Per patient he had not checked his blood sugar today. Per patient his fasting usually averages around 150.  Patient appetite is good. His wife cooks and monitors his diet. Patient has received his new CPAP machine.   [id number] Fasting blood sugar is 158. Patient has not been monitoring his blood sugars as ordered. RN discussed the importance of monitoring especially since taking insulin.  Plan: Patient will monitor blood sugars at  least twice a day and document.  Patient will monitor blood pressure since he had episodes of hypotension.   Our next appointment is by telephone on May 20, 2022  Please call Johny Shock [phone]  if you need to cancel or reschedule your appointment.   Please call the Suicide and Crisis Lifeline: 988 if you are experiencing a Mental Health or [place] or need someone to talk to.  The patient verbalized understanding of instructions, educational materials, and care plan provided today and agreed to receive a mailed copy of patient instructions, educational materials, and care plan.   Telephone follow up appointment with care management team member scheduled for: The patient has been provided with contact information for the care management team and has been advised to call with any health related questions or concerns.   [place] Care Management [phone]

## 2022-03-06 NOTE — Patient Outreach (Signed)
[facility]) Care Management [place] Note   03/06/2022 Name:  Manuel Kline MRN:  [id number] DOB:  Mar 10, 1932  Summary: Fasting blood sugar is 158. Patient has not been monitoring his blood sugars as ordered. RN discussed the importance of monitoring especially since taking insulin.   Recommendations/Changes made from today's visit: Patient will monitor blood sugars at least twice a day and document.  Patient will monitor blood pressure since he had episodes of hypotension   Subjective: Manuel Kline is an 88 y.o. year old male who is a primary patient of Dettinger, Fransisca Kaufmann, MD. The care management team was consulted for assistance with care management and/or care coordination needs.    RN Health Coach completed Telephone Visit today.   Objective:  Medications Reviewed Today     Reviewed by Lavera Guise, [facility] (Pharmacist) on 02/05/22 at 1255  Med List Status: <None>   Medication Order Taking? Sig Documenting Provider Last Dose Status Informant  ACCU-CHEK GUIDE test strip [id number]  by Other route as needed for other. [provider]  Active   allopurinol (ZYLOPRIM) 300 MG tablet [id number]  Take 1/2 (one-half) tablet by mouth once daily  Patient taking differently: Take 300 mg by mouth daily.   Dettinger, Fransisca Kaufmann, MD  Active   benazepril (LOTENSIN) 40 MG tablet [id number]  Take 1 tablet (40 mg total) by mouth daily. Dettinger, Fransisca Kaufmann, MD  Active   cloNIDine (CATAPRES) 0.1 MG tablet [id number]  Take 1 tablet (0.1 mg total) by mouth daily. Park Liter, MD  Active   clopidogrel (PLAVIX) 75 MG tablet [id number]  Take 1 tablet by mouth once daily  Patient taking differently: Take 75 mg by mouth daily.   Dettinger, Fransisca Kaufmann, MD  Active   Cyanocobalamin (VITAMIN B 12 PO) [id number]  Take 1,000 mcg by mouth daily. [provider]  Active Spouse/Significant Other  dapagliflozin propanediol (FARXIGA) 10 MG TABS tablet [id number]  Take 1 tablet (10  mg total) by mouth daily before breakfast. Dettinger, Fransisca Kaufmann, MD  Active            Med Note Parthenia [name] Feb 05, 2022 12:55 PM) [place] patient [place]   Dulaglutide (TRULICITY) 3 VQ/9.4HW SOPN [id number]  Inject 3 mg as directed once a week. [name] patient assistance program Dettinger, Fransisca Kaufmann, MD  Active            Med Note Manuel Kline, Manuel Kline Oct 05, 2021 11:21 PM) [place] patient assistance program    DULoxetine (CYMBALTA) 30 MG capsule [id number]  Take 1 capsule by mouth once daily  Patient taking differently: Take 30 mg by mouth daily.   Dettinger, Fransisca Kaufmann, MD  Active   fluticasone (CUTIVATE) 0.05 % cream [id number]  Apply 1 application. topically daily. [provider]  Active   furosemide (LASIX) 40 MG tablet [id number]  Take 1 tablet by mouth twice daily  Patient taking differently: Take 40 mg by mouth 2 (two) times daily.   Dettinger, Fransisca Kaufmann, MD  Active   gabapentin (NEURONTIN) 300 MG capsule [id number]  Take 1 capsule by mouth twice daily  Patient taking differently: Take 300 mg by mouth 2 (two) times daily.   Dettinger, Fransisca Kaufmann, MD  Active   Insulin Glargine [facility] KWIKPEN) 100 UNIT/ML SOPN [id number]  Inject 0.25 mLs (25 Units total) into the skin daily. Dettinger, Fransisca Kaufmann, MD  Active  Med Note Manuel Kline, Royce Macadamia   Sun Oct 05, 2021 11:21 PM) [place] patient assistance program    insulin regular (HUMULIN R) 100 units/mL injection [id number]  Inject 0.3 mLs (30 Units total) into the skin 2 (two) times daily before a meal. Dettinger, Fransisca Kaufmann, MD  Active            Med Note (Georgiann Cocker, AMY E   Wed Dec 10, 2021  9:19 AM) Needs patient assistance  metoprolol succinate (TOPROL-XL) 50 MG 24 hr tablet [id number]  TAKE 1 TABLET BY MOUTH ONCE DAILY TAKE  WITH  OR  IMMEDIATELY  FOLLOWING  A  MEAL  Patient taking differently: Take 50 mg by mouth daily.   Dettinger, Fransisca Kaufmann, MD  Active   Multiple Vitamin (MULTIVITAMIN ADULT  PO) [id number]  Take 1 tablet by mouth daily. [provider]  Active   nitroGLYCERIN (NITROSTAT) 0.4 MG SL tablet [id number]  Place 1 tablet (0.4 mg total) under the tongue every 5 (five) minutes as needed for chest pain. Dettinger, Fransisca Kaufmann, MD  Active   Omega-3 Fatty Acids (FISH OIL) 1000 MG CPDR [id number]  Take 1 tablet by mouth daily. [provider]  Active Spouse/Significant Other  polyethylene glycol powder (GLYCOLAX/MIRALAX) powder [id number]  Take 17 g by mouth 2 (two) times daily as needed.  Patient taking differently: Take 17 g by mouth 2 (two) times daily as needed for mild constipation or moderate constipation.   Eustaquio Maize, MD  Active   rosuvastatin (CRESTOR) 5 MG tablet [id number]  Take 1 tablet (5 mg total) by mouth daily. Dettinger, Fransisca Kaufmann, MD  Active   vitamin C (ASCORBIC ACID) 500 MG tablet [id number]  Take 500 mg by mouth daily. [provider]  Active              SDOH:  (Social Determinants of Health) assessments and interventions performed:    Care Plan  Review of patient past medical history, allergies, medications, health status, including review of consultants reports, laboratory and other test data, was performed as part of comprehensive evaluation for care management services.   Care Plan : RN Care Manager Plan of Care  Updates made by Aniketh Huberty, Eppie Gibson, RN since 03/06/2022 12:00 AM     Problem: Knowledge Deficit Related to Diabetes and Care Coordination Needs   Priority: High     Long-Range Goal: Development Plan of Care for Management of Diabetes   Start Date: 08/27/2021  Expected End Date: 08/29/2022  Priority: High  Note:   Current Barriers:  Knowledge Deficits related to plan of care for management of DMII   RNCM Clinical Goal(s):  Patient will verbalize understanding of plan for management of DMII as evidenced by continuation of monitoring blood sugars and adhering to diabetic diet and low cholesterol diet   through collaboration with RN Care manager, provider, and care team.   Interventions: Inter-disciplinary care team collaboration (see longitudinal plan of care) Evaluation of current treatment plan related to  self management and patient's adherence to plan as established by provider   Patient Goals/Self-Care Activities: Take medications as prescribed   Attend all scheduled provider appointments Call pharmacy for medication refills 3-7 days in advance of running out of medications Attend church or other social activities Perform all self care activities independently  Call provider office for new concerns or questions  schedule appointment with eye doctor check blood sugar at prescribed times: once daily check feet daily for cuts, sores or redness take  the blood sugar meter to all doctor visits trim toenails straight across drink 6 to 8 glasses of water each day manage portion size Bake foods and cook with olive oil when possible   [id number] Per patient he had not checked his blood sugar today. Per patient his fasting usually averages around 150.  Patient appetite is good. His wife cooks and monitors his diet. Patient has received his new CPAP machine.   [id number] Fasting blood sugar is 158. Patient has not been monitoring his blood sugars as ordered. RN discussed the importance of monitoring especially since taking insulin.  Plan: Patient will monitor blood sugars at least twice a day and document.  Patient will monitor blood pressure since he had episodes of hypotension.       Plan: Telephone follow up appointment with care management team member scheduled for:  May 20, 2022 The patient has been provided with contact information for the care management team and has been advised to call with any health related questions or concerns.   [place] [facility] Management [phone]

## 2022-03-19 ENCOUNTER — Encounter (INDEPENDENT_AMBULATORY_CARE_PROVIDER_SITE_OTHER): Payer: Self-pay

## 2022-03-19 ENCOUNTER — Other Ambulatory Visit: Payer: Self-pay | Admitting: Family Medicine

## 2022-03-19 DIAGNOSIS — E113393 Type 2 diabetes mellitus with moderate nonproliferative diabetic retinopathy without macular edema, bilateral: Secondary | ICD-10-CM | POA: Diagnosis not present

## 2022-03-19 DIAGNOSIS — G629 Polyneuropathy, unspecified: Secondary | ICD-10-CM

## 2022-03-19 DIAGNOSIS — Z961 Presence of intraocular lens: Secondary | ICD-10-CM | POA: Diagnosis not present

## 2022-03-19 DIAGNOSIS — H524 Presbyopia: Secondary | ICD-10-CM | POA: Diagnosis not present

## 2022-03-19 DIAGNOSIS — H40013 Open angle with borderline findings, low risk, bilateral: Secondary | ICD-10-CM | POA: Diagnosis not present

## 2022-03-19 LAB — HM DIABETES EYE EXAM

## 2022-03-21 DIAGNOSIS — G4733 Obstructive sleep apnea (adult) (pediatric): Secondary | ICD-10-CM | POA: Diagnosis not present

## 2022-03-23 ENCOUNTER — Ambulatory Visit: Payer: Self-pay | Admitting: *Deleted

## 2022-03-23 NOTE — Patient Outreach (Signed)
[facility]) Care Management  03/23/2022  Manuel Kline [birth year] [id number]   RN Health Coach telephone call to patient spoke with wife [name].  [employer] compliance verified. [id number] Patient was not available to tell what his fasting blood sugar is. Patient wife stated he is checking it more. Patient A1C is 7.7 goal is 7.0. [name] case closure. RN referred to Care Coordinator for continue follow up care. Patient needs encouragement to monitor his blood sugars.   Plan RN Health Coach Case Closure Referred to Care Coordinator  [place] Care Management [phone]

## 2022-04-09 ENCOUNTER — Encounter: Payer: Self-pay | Admitting: Family Medicine

## 2022-04-09 ENCOUNTER — Ambulatory Visit (INDEPENDENT_AMBULATORY_CARE_PROVIDER_SITE_OTHER): Payer: Medicare Other | Admitting: Family Medicine

## 2022-04-09 VITALS — BP 106/64 | HR 62 | Temp 98.3°F | Ht 64.0 in | Wt 225.0 lb

## 2022-04-09 DIAGNOSIS — Z794 Long term (current) use of insulin: Secondary | ICD-10-CM

## 2022-04-09 DIAGNOSIS — E782 Mixed hyperlipidemia: Secondary | ICD-10-CM

## 2022-04-09 DIAGNOSIS — I5022 Chronic systolic (congestive) heart failure: Secondary | ICD-10-CM

## 2022-04-09 DIAGNOSIS — E1142 Type 2 diabetes mellitus with diabetic polyneuropathy: Secondary | ICD-10-CM | POA: Diagnosis not present

## 2022-04-09 DIAGNOSIS — E113393 Type 2 diabetes mellitus with moderate nonproliferative diabetic retinopathy without macular edema, bilateral: Secondary | ICD-10-CM

## 2022-04-09 DIAGNOSIS — I10 Essential (primary) hypertension: Secondary | ICD-10-CM | POA: Diagnosis not present

## 2022-04-09 DIAGNOSIS — E1169 Type 2 diabetes mellitus with other specified complication: Secondary | ICD-10-CM

## 2022-04-09 DIAGNOSIS — N184 Chronic kidney disease, stage 4 (severe): Secondary | ICD-10-CM | POA: Diagnosis not present

## 2022-04-09 DIAGNOSIS — Z8673 Personal history of transient ischemic attack (TIA), and cerebral infarction without residual deficits: Secondary | ICD-10-CM | POA: Diagnosis not present

## 2022-04-09 LAB — BAYER DCA HB A1C WAIVED: HB A1C (BAYER DCA - WAIVED): 8 % — ABNORMAL HIGH (ref 4.8–5.6)

## 2022-04-09 MED ORDER — FUROSEMIDE 40 MG PO TABS: 40.0000 mg | ORAL_TABLET | Freq: Two times a day (BID) | ORAL | 1 refills | Status: DC

## 2022-04-09 MED ORDER — GABAPENTIN 300 MG PO CAPS: 300.0000 mg | ORAL_CAPSULE | Freq: Two times a day (BID) | ORAL | 1 refills | Status: DC

## 2022-04-09 MED ORDER — CLOPIDOGREL BISULFATE 75 MG PO TABS: 75.0000 mg | ORAL_TABLET | Freq: Every day | ORAL | 1 refills | Status: DC

## 2022-04-09 MED ORDER — METOPROLOL SUCCINATE ER 50 MG PO TB24: ORAL_TABLET | ORAL | 3 refills | Status: DC

## 2022-04-09 MED ORDER — DULOXETINE HCL 30 MG PO CPEP: 30.0000 mg | ORAL_CAPSULE | Freq: Every day | ORAL | 1 refills | Status: DC

## 2022-04-09 MED ORDER — ALLOPURINOL 300 MG PO TABS: ORAL_TABLET | ORAL | 3 refills | Status: DC

## 2022-04-09 MED ORDER — FREESTYLE LIBRE 2 READER DEVI: 1.0000 | Freq: Four times a day (QID) | 0 refills | Status: DC

## 2022-04-09 MED ORDER — FREESTYLE LIBRE 2 SENSOR MISC: 1.0000 | 11 refills | Status: DC

## 2022-04-09 MED ORDER — NITROGLYCERIN 0.4 MG SL SUBL: 0.4000 mg | SUBLINGUAL_TABLET | SUBLINGUAL | 0 refills | Status: DC | PRN

## 2022-04-09 NOTE — Progress Notes (Signed)
BP 106/64   Pulse 62   Temp 98.3 F (36.8 C)   Ht 5' 4"  (1.626 m)   Wt 225 lb (102.1 kg)   SpO2 97%   BMI 38.62 kg/m    Subjective:   Patient ID: Manuel Kline, male    DOB: March 30, 1932, 88 y.o.   MRN: [id number]  HPI: Manuel Kline is a 88 y.o. male presenting on 04/09/2022 for Medical Management of Chronic Issues, Diabetes, Hyperlipidemia, and Hypertension   HPI Type 2 diabetes mellitus Patient comes in today for recheck of his diabetes. Patient has been currently taking [place] and Trulicity and Humulin R and Engineer, agricultural. Patient is currently on an ACE inhibitor/ARB. Patient has seen an ophthalmologist this year. Patient denies any new issues with their feet. The symptom started onset as an adult significant neuropathy and retinopathy and CAD and CHF and hypertension and hyperlipidemia ARE RELATED TO DM   Hypertension and CAD and CHF Patient is currently on Benadryl and clonidine as needed and furosemide and metoprolol, and their blood pressure today is 106/64. Patient denies any lightheadedness or dizziness. Patient denies headaches, blurred vision, chest pains, shortness of breath, or weakness. Denies any side effects from medication and is content with current medication.   Hyperlipidemia Patient is coming in for recheck of his hyperlipidemia. The patient is currently taking Crestor and fish oil. They deny any issues with myalgias or history of liver damage from it. They deny any focal numbness or weakness or chest pain.   Relevant past medical, surgical, family and social history reviewed and updated as indicated. Interim medical history since our last visit reviewed. Allergies and medications reviewed and updated.  Review of Systems  Constitutional:  Negative for chills and fever.  Respiratory:  Negative for shortness of breath and wheezing.   Cardiovascular:  Negative for chest pain and leg swelling.  Musculoskeletal:  Negative for back pain and gait problem.  Skin:  Negative  for rash.  Neurological:  Positive for numbness. Negative for weakness.  All other systems reviewed and are negative.   Per HPI unless specifically indicated above   Allergies as of 04/09/2022   No Known Allergies      Medication List        Accurate as of April 09, 2022  1:32 PM. If you have any questions, ask your nurse or doctor.          Accu-Chek Guide test strip Generic drug: glucose blood by Other route as needed for other.   allopurinol 300 MG tablet Commonly known as: ZYLOPRIM Take 1/2 (one-half) tablet by mouth once daily   ascorbic acid 500 MG tablet Commonly known as: VITAMIN C Take 500 mg by mouth daily.   Basaglar KwikPen 100 UNIT/ML Inject 0.25 mLs (25 Units total) into the skin daily.   benazepril 40 MG tablet Commonly known as: LOTENSIN Take 1 tablet (40 mg total) by mouth daily.   cloNIDine 0.1 MG tablet Commonly known as: CATAPRES Take 1 tablet (0.1 mg total) by mouth daily.   clopidogrel 75 MG tablet Commonly known as: PLAVIX Take 1 tablet (75 mg total) by mouth daily.   dapagliflozin propanediol 10 MG Tabs tablet Commonly known as: Farxiga Take 1 tablet (10 mg total) by mouth daily before breakfast.   DULoxetine 30 MG capsule Commonly known as: CYMBALTA Take 1 capsule (30 mg total) by mouth daily.   Fish Oil 1000 MG Cpdr Take 1 tablet by mouth daily.   fluticasone 0.05 % cream  Commonly known as: CUTIVATE Apply 1 application. topically daily.   furosemide 40 MG tablet Commonly known as: LASIX Take 1 tablet (40 mg total) by mouth 2 (two) times daily. What changed: when to take this   gabapentin 300 MG capsule Commonly known as: NEURONTIN Take 1 capsule (300 mg total) by mouth 2 (two) times daily. What changed: additional instructions Changed by: Fransisca Kaufmann Vivian Okelley, MD   insulin regular 100 units/mL injection Commonly known as: HumuLIN R Inject 0.3 mLs (30 Units total) into the skin 2 (two) times daily before a meal.    Insulin Syringe-Needle U-100 31G X 5/16" 1 ML Misc Commonly known as: RELION INSULIN SYRINGE 1ML/31G USE TO INJECT INSULIN TWICE DAILY AS DIRECTED. DX: E11.65   metoprolol succinate 50 MG 24 hr tablet Commonly known as: TOPROL-XL TAKE 1 TABLET BY MOUTH ONCE DAILY WITH OR IMMEDIATELY FOLLOWING A MEAL   MULTIVITAMIN ADULT PO Take 1 tablet by mouth daily.   nitroGLYCERIN 0.4 MG SL tablet Commonly known as: NITROSTAT Place 1 tablet (0.4 mg total) under the tongue every 5 (five) minutes as needed for chest pain.   polyethylene glycol powder 17 GM/SCOOP powder Commonly known as: GLYCOLAX/MIRALAX Take 17 g by mouth 2 (two) times daily as needed. What changed: reasons to take this   rosuvastatin 5 MG tablet Commonly known as: Crestor Take 1 tablet (5 mg total) by mouth daily.   Trulicity 3 OL/0.7EM Sopn Generic drug: Dulaglutide Inject 3 mg as directed once a week. [name] patient assistance program   VITAMIN B 12 PO Take 1,000 mcg by mouth daily.         Objective:   BP 106/64   Pulse 62   Temp 98.3 F (36.8 C)   Ht 5' 4"  (1.626 m)   Wt 225 lb (102.1 kg)   SpO2 97%   BMI 38.62 kg/m   Wt Readings from Last 3 Encounters:  04/09/22 225 lb (102.1 kg)  01/22/22 230 lb (104.3 kg)  01/14/22 229 lb (103.9 kg)    Physical Exam Vitals and nursing note reviewed.  Constitutional:      General: He is not in acute distress.    Appearance: He is well-developed. He is not diaphoretic.  Eyes:     General: No scleral icterus.    Conjunctiva/sclera: Conjunctivae normal.  Neck:     Thyroid: No thyromegaly.  Cardiovascular:     Rate and Rhythm: Normal rate and regular rhythm.     Heart sounds: Normal heart sounds. No murmur heard. Pulmonary:     Effort: Pulmonary effort is normal. No respiratory distress.     Breath sounds: Normal breath sounds. No wheezing.  Musculoskeletal:        General: No swelling. Normal range of motion.     Cervical back: Neck supple.   Lymphadenopathy:     Cervical: No cervical adenopathy.  Skin:    General: Skin is warm and dry.     Findings: No rash.  Neurological:     Mental Status: He is alert and oriented to person, place, and time.     Coordination: Coordination normal.  Psychiatric:        Behavior: Behavior normal.    Diabetic Foot Exam - Simple   Simple Foot Form Visual Inspection No deformities, no ulcerations, no other skin breakdown bilaterally: Yes Sensation Testing See comments: Yes Pulse Check Posterior Tibialis and Dorsalis pulse intact bilaterally: Yes Comments No sensation to fine touch at all on either foot  Results for orders placed or performed in visit on 04/09/22  HM DIABETES EYE EXAM  Result Value Ref Range   HM Diabetic Eye Exam Retinopathy (A) No Retinopathy    Assessment & Plan:   Problem List Items Addressed This Visit       Cardiovascular and Mediastinum   Chronic systolic heart failure (HCC) (Chronic)   Relevant Medications   furosemide (LASIX) 40 MG tablet   metoprolol succinate (TOPROL-XL) 50 MG 24 hr tablet   nitroGLYCERIN (NITROSTAT) 0.4 MG SL tablet   Essential hypertension   Relevant Medications   furosemide (LASIX) 40 MG tablet   metoprolol succinate (TOPROL-XL) 50 MG 24 hr tablet   nitroGLYCERIN (NITROSTAT) 0.4 MG SL tablet   Other Relevant Orders   CBC with Differential/Platelet   CMP14+EGFR   Lipid panel   Bayer DCA Hb A1c Waived     Endocrine   Type 2 diabetes mellitus with other specified complication (HCC) - Primary   Relevant Orders   CBC with Differential/Platelet   CMP14+EGFR   Lipid panel   Bayer DCA Hb A1c Waived   Diabetic peripheral neuropathy (HCC)   Relevant Medications   DULoxetine (CYMBALTA) 30 MG capsule   gabapentin (NEURONTIN) 300 MG capsule   Moderate nonproliferative diabetic retinopathy of both eyes without macular edema associated with type 2 diabetes mellitus (HCC)     Genitourinary   CKD (chronic kidney disease)  stage 4, GFR 15-29 ml/min (HCC)   Relevant Orders   CBC with Differential/Platelet   CMP14+EGFR   Lipid panel   Bayer DCA Hb A1c Waived     Other   Hyperlipidemia (Chronic)   Relevant Medications   furosemide (LASIX) 40 MG tablet   metoprolol succinate (TOPROL-XL) 50 MG 24 hr tablet   nitroGLYCERIN (NITROSTAT) 0.4 MG SL tablet   Other Relevant Orders   CBC with Differential/Platelet   CMP14+EGFR   Lipid panel   Bayer DCA Hb A1c Waived   History of TIA (transient ischemic attack)   Relevant Medications   clopidogrel (PLAVIX) 75 MG tablet    Patient's A1c is up to 8, recommend he watch his mealtime blood sugar and take the insulin for mealtime more consistently.  Watch diet as well.  If still up next time that we may have to increase his insulin.  Did not bring meter today but will have him bring his meter next time he comes.  This office visit was a visit to discuss patient's diabetic management and because she is out of control and using insulin 4 times daily and having to check her blood sugars 4 times daily I believe she would be a good candidate for a continuous subcutaneous glucose monitor such as freestyle libre.  Follow up plan: Return in about 3 months (around 07/10/2022), or if symptoms worsen or fail to improve, for Diabetes and hypertension and cholesterol.  Counseling provided for all of the vaccine components Orders Placed This Encounter  Procedures   CBC with Differential/Platelet   CMP14+EGFR   Lipid panel   Bayer DCA Hb A1c Waived    Caryl Pina, MD [facility] 04/09/2022, 1:32 PM

## 2022-04-10 ENCOUNTER — Telehealth: Payer: Self-pay | Admitting: *Deleted

## 2022-04-10 LAB — CMP14+EGFR
ALT: 18 IU/L (ref 0–44)
AST: 20 IU/L (ref 0–40)
Albumin/Globulin Ratio: 1.8 (ref 1.2–2.2)
Albumin: 4.7 g/dL (ref 3.7–4.7)
Alkaline Phosphatase: 115 IU/L (ref 44–121)
BUN/Creatinine Ratio: 22 (ref 10–24)
BUN: 66 mg/dL — ABNORMAL HIGH (ref 8–27)
Bilirubin Total: 0.5 mg/dL (ref 0.0–1.2)
CO2: 21 mmol/L (ref 20–29)
Calcium: 9.9 mg/dL (ref 8.6–10.2)
Chloride: 98 mmol/L (ref 96–106)
Creatinine, Ser: 3.01 mg/dL (ref 0.76–1.27)
Globulin, Total: 2.6 g/dL (ref 1.5–4.5)
Glucose: 216 mg/dL — ABNORMAL HIGH (ref 70–99)
Potassium: 5.1 mmol/L (ref 3.5–5.2)
Sodium: 140 mmol/L (ref 134–144)
Total Protein: 7.3 g/dL (ref 6.0–8.5)
eGFR: 19 mL/min/{1.73_m2} — ABNORMAL LOW (ref >59)

## 2022-04-10 LAB — CBC WITH DIFFERENTIAL/PLATELET
Basophils Absolute: 0 10*3/uL (ref 0.0–0.2)
Basos: 0 %
EOS (ABSOLUTE): 0.2 10*3/uL (ref 0.0–0.4)
Eos: 3 %
Hematocrit: 39.3 % (ref 37.5–51.0)
Hemoglobin: 13.3 g/dL (ref 13.0–17.7)
Immature Grans (Abs): 0 10*3/uL (ref 0.0–0.1)
Immature Granulocytes: 0 %
Lymphocytes Absolute: 2.1 10*3/uL (ref 0.7–3.1)
Lymphs: 29 %
MCH: 30.5 pg (ref 26.6–33.0)
MCHC: 33.8 g/dL (ref 31.5–35.7)
MCV: 90 fL (ref 79–97)
Monocytes Absolute: 0.6 10*3/uL (ref 0.1–0.9)
Monocytes: 8 %
Neutrophils Absolute: 4.4 10*3/uL (ref 1.4–7.0)
Neutrophils: 60 %
Platelets: 208 10*3/uL (ref 150–450)
RBC: 4.36 x10E6/uL (ref 4.14–5.80)
RDW: 14.4 % (ref 11.6–15.4)
WBC: 7.3 10*3/uL (ref 3.4–10.8)

## 2022-04-10 LAB — LIPID PANEL
Chol/HDL Ratio: 4 ratio (ref 0.0–5.0)
Cholesterol, Total: 152 mg/dL (ref 100–199)
HDL: 38 mg/dL — ABNORMAL LOW (ref >39)
LDL Chol Calc (NIH): 72 mg/dL (ref 0–99)
Triglycerides: 260 mg/dL — ABNORMAL HIGH (ref 0–149)
VLDL Cholesterol Cal: 42 mg/dL — ABNORMAL HIGH (ref 5–40)

## 2022-04-10 NOTE — Telephone Encounter (Signed)
Already received this call report and dealt with it.

## 2022-04-10 NOTE — Telephone Encounter (Signed)
Creatinine = 3.01  Reported by [facility] via phone call

## 2022-04-21 DIAGNOSIS — G4733 Obstructive sleep apnea (adult) (pediatric): Secondary | ICD-10-CM | POA: Diagnosis not present

## 2022-04-22 ENCOUNTER — Ambulatory Visit (INDEPENDENT_AMBULATORY_CARE_PROVIDER_SITE_OTHER): Payer: Medicare Other

## 2022-04-22 DIAGNOSIS — I255 Ischemic cardiomyopathy: Secondary | ICD-10-CM | POA: Diagnosis not present

## 2022-04-23 LAB — CUP PACEART REMOTE DEVICE CHECK
Battery Remaining Longevity: 50 mo
Battery Voltage: 2.97 V
Brady Statistic AP VP Percent: 0.09 %
Brady Statistic AP VS Percent: 62.44 %
Brady Statistic AS VP Percent: 0.03 %
Brady Statistic AS VS Percent: 37.44 %
Brady Statistic RA Percent Paced: 62.52 %
Brady Statistic RV Percent Paced: 0.11 %
Date Time Interrogation Session: 20230823103624
HighPow Impedance: 50 Ohm
HighPow Impedance: 68 Ohm
Implantable Lead Implant Date: 20030519
Implantable Lead Implant Date: 20030519
Implantable Lead Location: 753859
Implantable Lead Location: 753860
Implantable Lead Model: 158
Implantable Lead Model: 4087
Implantable Lead Serial Number: 115102
Implantable Lead Serial Number: 159999
Implantable Pulse Generator Implant Date: 20171114
Lead Channel Impedance Value: 4047 Ohm
Lead Channel Impedance Value: 4047 Ohm
Lead Channel Impedance Value: 4047 Ohm
Lead Channel Impedance Value: 437 Ohm
Lead Channel Impedance Value: 494 Ohm
Lead Channel Impedance Value: 513 Ohm
Lead Channel Pacing Threshold Amplitude: 0.875 V
Lead Channel Pacing Threshold Amplitude: 1.25 V
Lead Channel Pacing Threshold Pulse Width: 0.4 ms
Lead Channel Pacing Threshold Pulse Width: 0.4 ms
Lead Channel Sensing Intrinsic Amplitude: 14.125 mV
Lead Channel Sensing Intrinsic Amplitude: 14.125 mV
Lead Channel Sensing Intrinsic Amplitude: 2.5 mV
Lead Channel Sensing Intrinsic Amplitude: 2.5 mV
Lead Channel Setting Pacing Amplitude: 2.5 V
Lead Channel Setting Pacing Amplitude: 2.5 V
Lead Channel Setting Pacing Pulse Width: 0.4 ms
Lead Channel Setting Sensing Sensitivity: 0.3 mV

## 2022-05-06 ENCOUNTER — Encounter: Payer: Self-pay | Admitting: *Deleted

## 2022-05-06 ENCOUNTER — Ambulatory Visit: Payer: Self-pay | Admitting: *Deleted

## 2022-05-06 ENCOUNTER — Ambulatory Visit (INDEPENDENT_AMBULATORY_CARE_PROVIDER_SITE_OTHER): Payer: Medicare Other | Admitting: Podiatry

## 2022-05-06 ENCOUNTER — Encounter: Payer: Self-pay | Admitting: Podiatry

## 2022-05-06 DIAGNOSIS — D689 Coagulation defect, unspecified: Secondary | ICD-10-CM

## 2022-05-06 DIAGNOSIS — M2012 Hallux valgus (acquired), left foot: Secondary | ICD-10-CM

## 2022-05-06 DIAGNOSIS — B351 Tinea unguium: Secondary | ICD-10-CM | POA: Diagnosis not present

## 2022-05-06 DIAGNOSIS — M79675 Pain in left toe(s): Secondary | ICD-10-CM

## 2022-05-06 DIAGNOSIS — E1142 Type 2 diabetes mellitus with diabetic polyneuropathy: Secondary | ICD-10-CM | POA: Diagnosis not present

## 2022-05-06 DIAGNOSIS — M79674 Pain in right toe(s): Secondary | ICD-10-CM | POA: Diagnosis not present

## 2022-05-06 NOTE — Patient Instructions (Addendum)
Visit Information  Thank you for taking time to visit with me today. Please don't hesitate to contact me if I can be of assistance to you.   Following are the goals we discussed today:   Goals Addressed               This Visit's Progress     Patient Stated     Manage Diabetes [facility]) (pt-stated)   Not on track     Care Coordination Interventions: Discussed plans with patient for ongoing care management follow up and provided patient with direct contact information for care management team Assessed social determinant of health barriers RN Cm to follow up with pcp on continuous glucometer.  Discussed last HgA1c of 7.7 and his goal below 7.  Assessed daily meal examples Discussed better choices.  Suggest sweet potato vs white potato Discussed moderate intake of corn  Assess cbg values . Spoke with wife [name] (as she does  the cooking at home) also about intake of high carbohydrate foods like corn and potatoes in moderation. She voiced understanding.         Our next appointment is by telephone on 05/20/22  at 0930  Please call the care guide team at [phone] if you need to cancel or reschedule your appointment.   If you are experiencing a Mental Health or [place] or need someone to talk to, please call the Suicide and Crisis Lifeline: 988 call the [place] National Suicide Prevention Lifeline: 5596882876 or TTY: 619-527-7068 TTY 507-202-2242) to talk to a trained counselor call 1-800-273-TALK (toll free, 24 hour hotline) call the [facility]: [phone] call 911   Patient verbalizes understanding of instructions and care plan provided today and agrees to view in [name]. Active MyChart status and patient understanding of how to access instructions and care plan via MyChart confirmed with patient.     The patient has been provided with contact information for the care management team and has been advised to call with any health related  questions or concerns.   [place] Lavina Hamman, RN, BSN, [place] Coordinator Office number [phone]

## 2022-05-06 NOTE — Patient Outreach (Signed)
  Care Coordination   Initial Visit Note   05/06/2022 Name: Manuel Kline MRN: [id number] DOB: 1932-01-25  Manuel Kline is a 88 y.o. year old male who sees Dettinger, Fransisca Kaufmann, MD for primary care. I spoke with  Manuel Kline by phone today.  What matters to the patients health and wellness today?  Manage diabetes Generally gets cbg values averaging 150s He confirms most of his meals are via meals on wheels and some restaurant meals. He confirms he recently had a meal on wheel meal consisting of Chicken noodle casserole with broccoli. He prefers to decrease bread as he reports he is aware that white bread increases his blood sugar. He reports his MD is attempting to get him a continuous glucose meter.  He agrees to try to make better choices as discussed today   Goals Addressed               This Visit's Progress     Patient Stated     Manage Diabetes [facility]) (pt-stated)   Not on track     Care Coordination Interventions: Discussed plans with patient for ongoing care management follow up and provided patient with direct contact information for care management team Assessed social determinant of health barriers RN Cm to follow up with pcp on continuous glucometer.  Discussed last HgA1c of 7.7 and his goal below 7.  Assessed daily meal examples Discussed better choices.  Suggest sweet potato vs white potato Discussed moderate intake of corn  Assess cbg values . Spoke with wife Manuel Kline (as she does  the cooking at home) also about intake of high carbohydrate foods like corn and potatoes in moderation. She voiced understanding.         SDOH assessments and interventions completed:  Yes  SDOH Interventions Today    Flowsheet Row Most Recent Value  SDOH Interventions   Food Insecurity Interventions Intervention Not Indicated  Transportation Interventions Intervention Not Indicated  Stress Interventions Intervention Not Indicated  Social Connections Interventions Intervention Not Indicated         Care Coordination Interventions Activated:  Yes  Care Coordination Interventions:  Yes, provided   Follow up plan: Follow up call scheduled for 05/20/22 930    Encounter Outcome:  Pt. Visit Completed   Melburn Treiber L. Lavina Hamman, RN, BSN, [place] Coordinator Office number [phone]

## 2022-05-06 NOTE — Progress Notes (Signed)
This patient returns to my office for at risk foot care.  This patient requires this care by a professional since this patient will be at risk due to having diabetic neuropathy claudication in PVD and CKD.  This patient is unable to cut nails himself since the patient cannot reach his nails.These nails are painful walking and wearing shoes. Patient presents to the office with his daughter.  His daughter says he exhibits severe pain walking due to bone on inside of left foot.   This patient presents for at risk foot care today.  General Appearance  Alert, conversant and in no acute stress.  Vascular  Dorsalis pedis and posterior tibial  pulses are weakly  palpable  bilaterally.  Capillary return is within normal limits  bilaterally  Cold feet  bilaterally.  Absent digital hair noted.  Neurologic  Senn-Weinstein monofilament wire test diminished  bilaterally. Muscle power within normal limits bilaterally.  Nails Thick disfigured discolored nails with subungual debris  from hallux to fifth toes bilaterally. No evidence of bacterial infection or drainage bilaterally.  Orthopedic  No limitations of motion  feet .  No crepitus or effusions noted.  No bony pathology or digital deformities noted.  Plantar flexed fifth metatarsal head left foot.  HAV  Right foot.  Skin  normotropic skin with no porokeratosis noted bilaterally.  No signs of infections or ulcers noted.     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  Consent was obtained for treatment procedures.   Mechanical debridement of nails 1-5  bilaterally performed with a nail nipper.  Filed with dremel without incident. Injection therapy using 1.0 cc. Of 2% xylocaine( 20 mg.) plus 1 cc. of kenalog-la ( 10 mg) plus 1/2 cc. of dexamethazone phosphate ( 2 mg)    Return office visit  3 months                    Told patient to return for periodic foot care and evaluation due to potential at risk complications.   Gardiner Barefoot DPM

## 2022-05-12 ENCOUNTER — Telehealth: Payer: Medicare Other

## 2022-05-15 ENCOUNTER — Ambulatory Visit (INDEPENDENT_AMBULATORY_CARE_PROVIDER_SITE_OTHER): Payer: Medicare Other | Admitting: Pharmacist

## 2022-05-15 DIAGNOSIS — N184 Chronic kidney disease, stage 4 (severe): Secondary | ICD-10-CM

## 2022-05-15 DIAGNOSIS — E1169 Type 2 diabetes mellitus with other specified complication: Secondary | ICD-10-CM

## 2022-05-19 DIAGNOSIS — L538 Other specified erythematous conditions: Secondary | ICD-10-CM | POA: Diagnosis not present

## 2022-05-19 DIAGNOSIS — L218 Other seborrheic dermatitis: Secondary | ICD-10-CM | POA: Diagnosis not present

## 2022-05-19 DIAGNOSIS — D485 Neoplasm of uncertain behavior of skin: Secondary | ICD-10-CM | POA: Diagnosis not present

## 2022-05-19 DIAGNOSIS — G4733 Obstructive sleep apnea (adult) (pediatric): Secondary | ICD-10-CM | POA: Diagnosis not present

## 2022-05-19 DIAGNOSIS — D225 Melanocytic nevi of trunk: Secondary | ICD-10-CM | POA: Diagnosis not present

## 2022-05-19 DIAGNOSIS — Z872 Personal history of diseases of the skin and subcutaneous tissue: Secondary | ICD-10-CM | POA: Diagnosis not present

## 2022-05-19 DIAGNOSIS — L814 Other melanin hyperpigmentation: Secondary | ICD-10-CM | POA: Diagnosis not present

## 2022-05-19 DIAGNOSIS — L821 Other seborrheic keratosis: Secondary | ICD-10-CM | POA: Diagnosis not present

## 2022-05-19 DIAGNOSIS — L570 Actinic keratosis: Secondary | ICD-10-CM | POA: Diagnosis not present

## 2022-05-19 DIAGNOSIS — L281 Prurigo nodularis: Secondary | ICD-10-CM | POA: Diagnosis not present

## 2022-05-19 DIAGNOSIS — Z85828 Personal history of other malignant neoplasm of skin: Secondary | ICD-10-CM | POA: Diagnosis not present

## 2022-05-19 DIAGNOSIS — Z08 Encounter for follow-up examination after completed treatment for malignant neoplasm: Secondary | ICD-10-CM | POA: Diagnosis not present

## 2022-05-19 DIAGNOSIS — C44229 Squamous cell carcinoma of skin of left ear and external auricular canal: Secondary | ICD-10-CM | POA: Diagnosis not present

## 2022-05-20 ENCOUNTER — Ambulatory Visit: Payer: Self-pay | Admitting: *Deleted

## 2022-05-20 ENCOUNTER — Telehealth: Payer: Medicare Other | Admitting: *Deleted

## 2022-05-20 NOTE — Progress Notes (Signed)
Remote ICD transmission.   

## 2022-05-20 NOTE — Patient Instructions (Addendum)
Visit Information  Mr Dobbins please monitor your phone for calls from staff from [employer] ([employer] [phone])  Thank you for taking time to visit with me today. Please don't hesitate to contact me if I can be of assistance to you.   Following are the goals we discussed today:   Goals Addressed               This Visit's Progress     Patient Stated     Manage Diabetes [facility]) (pt-stated)        Care Coordination Interventions: Discussed plans with patient for ongoing care management follow up and provided patient with direct contact information for care management team Assessed social determinant of health barriers Follow up with pcp, patient and pharmacist on continuous glucometer via secure chat. Reviewed patient's insurance web site, www.GamingBus.com.ee. Found the vendor in network for the continuous glucose meter, [employer] ([phone], [email or web address]). Viewed the list of devices, Dexcom G 6 & 7 CGM, Abbott FreeStyle Libre 3, 2, & 14 day systems, Eversense CGM Sent an e-mail for patient to receive outreach           Our next appointment is by telephone on 06/19/22 at 0930  Please call the care guide team at [phone] if you need to cancel or reschedule your appointment.   If you are experiencing a Mental Health or Universal or need someone to talk to, please call the Suicide and Crisis Lifeline: 988 call the [place] National Suicide Prevention Lifeline: 587-229-5682 or TTY: 7083713402 TTY 416-855-7442) to talk to a trained counselor call 1-800-273-TALK (toll free, 24 hour hotline) call the [facility]: 909-380-2259 call 911   Patient verbalizes understanding of instructions and care plan provided today and agrees to view in [place]. Active MyChart status and patient understanding of how to access instructions and care plan via MyChart confirmed with patient.     The patient has been  provided with contact information for the care management team and has been advised to call with any health related questions or concerns.   [place] Lavina Hamman, RN, BSN, [place] Coordinator Office number [phone]

## 2022-05-20 NOTE — Patient Outreach (Signed)
  Care Coordination   Follow Up Visit Note   05/20/2022 Name: Manuel Kline MRN: [id number] DOB: 04-Oct-1931  Manuel Kline is a 88 y.o. year old male who sees Dettinger, Fransisca Kaufmann, MD for primary care. I spoke with  Manuel Kline by phone today.  What matters to the patients health and wellness today?  Status of the Continuous glucose monitor, fingers thicken and hard to get blood from them.   He reports only having a flip phone at this time.    Goals Addressed               This Visit's Progress     Patient Stated     Manage Diabetes [facility]) (pt-stated)        Care Coordination Interventions: Discussed plans with patient for ongoing care management follow up and provided patient with direct contact information for care management team Assessed social determinant of health barriers Follow up with pcp, patient and pharmacist on continuous glucometer via secure chat. Reviewed patient's insurance web site, www.GamingBus.com.ee. Found the vendor in network for the continuous glucose meter, [employer] ([phone], [email or web address]). Viewed the list of devices, Dexcom G 6 & 7 CGM, Abbott FreeStyle Libre 3, 2, & 14 day systems, Eversense CGM Sent an e-mail for patient to receive outreach           SDOH assessments and interventions completed:  No  SDOH Interventions Today    Flowsheet Row Most Recent Value  SDOH Interventions   Food Insecurity Interventions Intervention Not Indicated  Housing Interventions Intervention Not Indicated  Utilities Interventions Intervention Not Indicated  Stress Interventions Intervention Not Indicated        Care Coordination Interventions Activated:  Yes  Care Coordination Interventions:  Yes, provided   Follow up plan: Follow up call scheduled for 06/19/22    Encounter Outcome:  Pt. Visit Completed   [place] Penny L. Lavina Hamman, RN, BSN, [place] Coordinator Office number [phone]

## 2022-05-22 DIAGNOSIS — G4733 Obstructive sleep apnea (adult) (pediatric): Secondary | ICD-10-CM | POA: Diagnosis not present

## 2022-05-26 ENCOUNTER — Ambulatory Visit: Payer: Medicare Other | Admitting: Podiatry

## 2022-05-26 DIAGNOSIS — E1165 Type 2 diabetes mellitus with hyperglycemia: Secondary | ICD-10-CM | POA: Diagnosis not present

## 2022-05-26 NOTE — Progress Notes (Signed)
Chronic Care Management Pharmacy Note  05/15/2022 Name:  Manuel Kline MRN:  [id number] DOB:  September 30, 1931  Summary: Diabetes: Goal on track: NO. Uncontrolled-A1C 7.7-->8%% (okay for 88 yoM), GFR 26-->19;  Current treatment: meds were initially chosen prior to pharmd appt due to cost/patient comfort; we are working to optimize them [place]  Denies personal and family history of Medullary thyroid cancer (MTC) Continue Basaglar 25 units once daily (patient has been taking twice daily) CONTINUE FARXIGA 10MG DAILY-okay to continue given GFR 19 ENROLLED IN [place] PATIENT ASSISTANCE PROGRAM--(escribed to medvantx) CONTINUE Humulin R to 10-20 units twice daily with meals (no hypoglycemia)  Patient enrolled in PAP with [name] for TRULICITY, [place] R RXS ESCRIBED TO [facility] PER PROGRAM; ships 37-monthsupplies to patient's home every quarter(due to back orders unlikely to ship more than 1 month at a time)   Order sent for CGM libre 3 to [facility] (via [facility] portal)  Current glucose readings: fasting glucose: <200, post prandial glucose: 200s Denies hypoglycemic/hyperglycemic symptoms Discussed meal planning options and Plate method for healthy eating Avoid sugary drinks and desserts Incorporate balanced protein, non starchy veggies, 1 serving of carbohydrate with each meal Increase water intake Increase physical activity as able Current exercise: patient unable due to physical condition Recommended increase glp1; work to optimize insulin regimen and POs for T2DM Assessed patient finances. Re enrollment and dose changes sent to [facility] patient assistance program--will follow BP checked today--118/76 (currently holding clonidine d/t hypotension)--encouraged patient to call if BP changes  Patient Goals/Self-Care Activities patient will:  - take medications as prescribed as evidenced by patient report and  record review check glucose daily (3 times) or when symptomatic, document, and provide at future appointments collaborate with provider on medication access solutions engage in dietary modifications by FOLLOWING A HEART HEALTHY DIET/HEALTHY PLATE METHOD    Subjective: Manuel POLLITTis an 88y.o. year old male who is a primary patient of Dettinger, JFransisca Kaufmann MD.  The CCM team was consulted for assistance with disease management and care coordination needs.    Engaged with patient by telephone for follow up visit in response to provider referral for pharmacy case management and/or care coordination services.   Consent to Services:  The patient was given information about Chronic Care Management services, agreed to services, and gave verbal consent prior to initiation of services.  Please see initial visit note for detailed documentation.   Patient Care Team: Dettinger, JFransisca Kaufmann MD as PCP - General (Family Medicine) TEvans Lance MD as PCP - Electrophysiology (Cardiology) KPark Liter MD as PCP - Cardiology (Cardiology) GRegenia Skeeter MD as Consulting Physician (Internal Medicine) Regal, NTamala Fothergill DPM as Consulting Physician (Podiatry) RZadie RhineGClent Demark MD as Consulting Physician (Ophthalmology) TEvans Lance MD as Consulting Physician (Cardiology) MGardiner Barefoot DPM as Consulting Physician (Podiatry) PBlanca Friend JRoyce Macadamia [facility](Pharmacist) Dohmeier, CAsencion Partridge MD as Consulting Physician (Neurology) GBarbaraann Faster RN as THornitosManagement   Objective:  Lab Results  Component Value Date   CRE[place]ININE 3.01 ([place] 04/09/2022   CREATININE 2.38 (H) 01/07/2022   CREATININE 2.11 (H) 10/02/2021    Lab Results  Component Value Date   HGBA1C 8.0 (H) 04/09/2022   Last diabetic Eye exam:  Lab Results  Component Value Date/Time   HMDIABEYEEXA Retinopathy (A) 03/19/2022 12:00 AM    Last diabetic Foot exam: No results found for: "HMDIABFOOTEX"  Component Value Date/Time   CHOL 152 04/09/2022 1257   TRIG 260 (H) 04/09/2022 1257   HDL 38 (L) 04/09/2022 1257   CHOLHDL 4.0 04/09/2022 1257   CHOLHDL 3.4 12/26/2018 0558   VLDL 36 12/26/2018 0558   LDLCALC 72 04/09/2022 1257       Latest Ref Rng & Units 04/09/2022   12:57 PM 01/07/2022    1:25 PM 10/02/2021    9:12 AM  Hepatic Function  Total Protein 6.0 - 8.5 g/dL 7.3  6.9  6.9   Albumin 3.7 - 4.7 g/dL 4.7  4.3  4.6   AST 0 - 40 IU/L 20  33  26   ALT 0 - 44 IU/L 18  22  26    Alk Phosphatase 44 - 121 IU/L 115  97  122   Total Bilirubin 0.0 - 1.2 mg/dL 0.5  0.5  0.6     Lab Results  Component Value Date/Time   TSH 1.421 12/25/2018 10:40 PM       Latest Ref Rng & Units 04/09/2022   12:57 PM 01/07/2022    1:25 PM 10/02/2021    9:12 AM  CBC  WBC 3.4 - 10.8 x10E3/uL 7.3  7.9  7.3   Hemoglobin 13.0 - 17.7 g/dL 13.3  13.4  13.9   Hematocrit 37.5 - 51.0 % 39.3  37.9  41.1   Platelets 150 - 450 x10E3/uL 208  209  182     No results found for: "VD25OH"  Clinical ASCVD: Yes  The ASCVD Risk score (Arnett DK, et al., 2019) failed to calculate for the following reasons:   The 2019 ASCVD risk score is only valid for ages 13 to 66   The patient has a prior MI or stroke diagnosis    Other: (CHADS2VASc if Afib, PHQ9 if depression, MMRC or CAT for COPD, ACT, DEXA)  Social History   Tobacco Use  Smoking Status Never  Smokeless Tobacco Never   BP Readings from Last 3 Encounters:  04/09/22 106/64  02/05/22 118/67  01/22/22 98/66   Pulse Readings from Last 3 Encounters:  04/09/22 62  02/05/22 69  01/22/22 65   Wt Readings from Last 3 Encounters:  04/09/22 225 lb (102.1 kg)  01/22/22 230 lb (104.3 kg)  01/14/22 229 lb (103.9 kg)    Assessment: Review of patient past medical history, allergies, medications, health status, including review of consultants reports, laboratory and other test data, was performed as part of comprehensive evaluation and provision of chronic care  management services.   SDOH:  (Social Determinants of Health) assessments and interventions performed:  SDOH Interventions    [place] Coordination from 05/06/2022 in [place] from 12/10/2021 in [place] Patient Outreach Telephone from 11/26/2021 in [place] Patient Outreach Telephone from 08/27/2021 in [place] Patient Outreach Telephone from 11/18/2020 in [place] Coordination  SDOH Interventions       Food Insecurity Interventions Intervention Not Indicated Intervention Not Indicated Intervention Not Indicated Intervention Not Indicated Intervention Not Indicated  Housing Interventions -- Intervention Not Indicated Intervention Not Indicated Intervention Not Indicated Intervention Not Indicated  Transportation Interventions Intervention Not Indicated Intervention Not Indicated Intervention Not Indicated Intervention Not Indicated Intervention Not Indicated  Financial Strain Interventions -- Intervention Not Indicated -- -- --  Physical Activity Interventions -- Intervention Not Indicated, Other (Comments)  ["piddles" around the house, yard work] -- -- --  Stress Interventions Intervention Not Indicated Intervention Not Indicated -- -- --  Social Connections Interventions Intervention Not Indicated Intervention Not Indicated -- -- --       CCM Care Plan  No Known Allergies  Medications Reviewed Today     Reviewed by Barbaraann Faster, RN (Registered Nurse) on 05/06/22 at [place] List Status: <None>   Medication Order Taking? Sig Documenting Provider Last Dose Status Informant  ACCU-CHEK GUIDE test strip [id number]  by Other route as needed for other. [provider]  Active   allopurinol (ZYLOPRIM) 300 MG tablet [id number]  Take 1/2 (one-half) tablet by mouth once daily  Dettinger, Fransisca Kaufmann, MD  Active   benazepril (LOTENSIN) 40 MG tablet [id number]  Take 1 tablet (40 mg total) by mouth daily. Dettinger, Fransisca Kaufmann, MD  Active   cloNIDine (CATAPRES) 0.1 MG tablet [id number]  Take 1 tablet (0.1 mg total) by mouth daily. [place] Liter, MD  Active   clopidogrel (PLAVIX) 75 MG tablet [id number]  Take 1 tablet (75 mg total) by mouth daily. Dettinger, Fransisca Kaufmann, MD  Active   Continuous Blood Gluc Receiver (FREESTYLE LIBRE 2 READER) [place] [id number]  1 each by Does not apply route 4 (four) times daily. Dettinger, Fransisca Kaufmann, MD  Active   Continuous Blood Gluc Sensor (FREESTYLE LIBRE 2 SENSOR) [place] [id number]  1 each by Does not apply route every 14 (fourteen) days. Dettinger, Fransisca Kaufmann, MD  Active   Cyanocobalamin (VITAMIN B 12 PO) [id number]  Take 1,000 mcg by mouth daily. [provider]  Active Spouse/Significant Other  dapagliflozin propanediol (FARXIGA) 10 MG TABS tablet [id number]  Take 1 tablet (10 mg total) by mouth daily before breakfast. Dettinger, Fransisca Kaufmann, MD  Active            Med Note Parthenia [name] Feb 05, 2022 12:55 PM) Via [place] patient [place]   Dulaglutide (TRULICITY) 3 DJ/2.4QA SOPN [id number]  Inject 3 mg as directed once a week. [name] patient assistance program Dettinger, Fransisca Kaufmann, MD  Active            Med Note Mickie Hillier Oct 05, 2021 11:21 PM) [place] patient assistance program    DULoxetine (CYMBALTA) 30 MG capsule [id number]  Take 1 capsule (30 mg total) by mouth daily. Dettinger, Fransisca Kaufmann, MD  Active   fluticasone (CUTIVATE) 0.05 % cream [id number]  Apply 1 application. topically daily. [provider]  Active   furosemide (LASIX) 40 MG tablet [id number]  Take 1 tablet (40 mg total) by mouth 2 (two) times daily. Dettinger, Fransisca Kaufmann, MD  Active   gabapentin (NEURONTIN) 300 MG capsule [id number]  Take 1 capsule (300 mg total) by mouth 2 (two) times daily. Dettinger, Fransisca Kaufmann, MD  Active   Insulin  Glargine [facility] KWIKPEN) 100 UNIT/ML SOPN [id number]  Inject 0.25 mLs (25 Units total) into the skin daily. Dettinger, Fransisca Kaufmann, MD  Active            Med Note Blanca Friend, Omer Jack Oct 05, 2021 11:21 PM) [place] patient assistance program    insulin regular (HUMULIN R) 100 units/mL injection [id number]  Inject 0.3 mLs (30 Units total) into the skin 2 (two) times daily before a meal. Dettinger, Fransisca Kaufmann, MD  Active            Med Note [place], AMY E   Wed Dec 10, 2021  9:19 AM)  Needs patient assistance  Insulin Syringe-Needle U-100 (RELION INSULIN SYRINGE 1ML/31G) 31G X 5/16" 1 ML MISC [id number]  USE TO INJECT INSULIN TWICE DAILY AS DIRECTED. DX: E11.65 Dettinger, Fransisca Kaufmann, MD  Active   metoprolol succinate (TOPROL-XL) 50 MG 24 hr tablet [id number]  TAKE 1 TABLET BY MOUTH ONCE DAILY WITH OR IMMEDIATELY FOLLOWING A MEAL Dettinger, Fransisca Kaufmann, MD  Active   Multiple Vitamin (MULTIVITAMIN ADULT PO) [id number]  Take 1 tablet by mouth daily. [provider]  Active   nitroGLYCERIN (NITROSTAT) 0.4 MG SL tablet [id number]  Place 1 tablet (0.4 mg total) under the tongue every 5 (five) minutes as needed for chest pain. Dettinger, Fransisca Kaufmann, MD  Active   Omega-3 Fatty Acids (FISH OIL) 1000 MG CPDR [id number]  Take 1 tablet by mouth daily. [provider]  Active Spouse/Significant Other  polyethylene glycol powder (GLYCOLAX/MIRALAX) powder [id number]  Take 17 g by mouth 2 (two) times daily as needed.  Patient taking differently: Take 17 g by mouth 2 (two) times daily as needed for mild constipation or moderate constipation.   Eustaquio Maize, MD  Active   rosuvastatin (CRESTOR) 5 MG tablet [id number]  Take 1 tablet (5 mg total) by mouth daily. Dettinger, Fransisca Kaufmann, MD  Active   vitamin C (ASCORBIC ACID) 500 MG tablet [id number]  Take 500 mg by mouth daily. 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 angle 04/02/2021   Pseudophakia of both eyes 08/20/2020   Claudication in peripheral vascular disease ([place]) 07/23/2020   Sleep apnea    Osteoarthritis    Metabolic syndrome    Erosive esophagitis    Erectile dysfunction    Calcium oxalate renal stones    Moderate nonproliferative diabetic retinopathy of both eyes without macular edema associated with type 2 diabetes mellitus ([place]) 02/20/2020   Posterior vitreous detachment of both eyes 02/20/2020   History of TIA (transient ischemic attack) 01/19/2019   Cerebral embolism with cerebral infarction 12/26/2018   CKD (chronic kidney disease) stage 4, GFR 15-29 ml/min (HCC) 11/08/2017   Presence of automatic (implantable) cardiac defibrillator 11/08/2017   Ventricular tachycardia (paroxysmal) ([place]) 07/14/2016   Essential hypertension 11/30/2015   Diabetic peripheral neuropathy ([place]) 01/31/2015   OSA on CPAP 11/05/2014   CAD (coronary artery disease)    Dual implantable cardioverter-defibrillator in situ    Hyperlipidemia    Type 2 diabetes mellitus with other specified complication ([place])    Morbid obesity ([place])    Ischemic cardiomyopathy    Chronic systolic heart failure ([place])    S/P CABG (coronary artery bypass graft) 11/02/2000    Immunization History  Administered Date(s) Administered   Fluad Quad(high Dose 65+) 06/26/2019, 05/22/2020, 06/16/2021   Influenza, High Dose Seasonal PF 06/14/2017, 07/19/2018   Influenza,inj,Quad PF,6+ Mos 06/12/2014, 06/24/2015, 06/17/2016   Moderna Sars-Covid-2 Vaccination 09/12/2019, 10/13/2019, 07/04/2020   Pneumococcal Conjugate-13 03/01/2015   Pneumococcal Polysaccharide-23 03/16/2016   Tdap 02/16/2020   Zoster Recombinat (Shingrix) 02/17/2020, 09/17/2020    Conditions to be addressed/monitored: DMII and CKD Stage 4  Care Plan : PHARMD MEDICATION MANAGEMENT  Updates made by Lavera Guise, [place] since 05/26/2022 12:00 AM      Problem: CHL AMB "PATIENT-SPECIFIC PROBLEM"      Long-Range Goal: T2DM ,HLD PHARMD GOAL   Recent Progress:  Not on track  Priority: High  Note:   Current Barriers:  Unable to independently afford treatment regimen Unable to maintain control of T2DM Suboptimal therapeutic regimen for T2DM  Pharmacist Clinical Goal(s):  patient will verbalize ability to afford treatment regimen achieve control of T2DM as evidenced by GOAL A1C<7% adhere to plan to optimize therapeutic regimen for T2DM as evidenced by report of adherence to recommended medication management changes through collaboration with PharmD and provider.   Interventions: 1:1 collaboration with Dettinger, Fransisca Kaufmann, MD regarding development and update of comprehensive plan of care as evidenced by provider attestation and co-signature Inter-disciplinary care team collaboration (see longitudinal plan of care) Comprehensive medication review performed; medication list updated in electronic medical record  Diabetes: Goal on track: NO. Uncontrolled-A1C 7.7-->8%% (okay for 48 yoM), GFR 26-->19;  Current treatment: meds were initially chosen prior to pharmd appt due to cost/patient comfort; we are working to optimize them [employer]  Denies personal and family history of Medullary thyroid cancer (MTC) Continue Basaglar 25 units once daily (patient has been taking twice daily) CONTINUE FARXIGA 10MG DAILY-okay to continue given GFR 19 ENROLLED IN [place] PATIENT ASSISTANCE PROGRAM--(escribed to [facility]) CONTINUE Humulin R to 10-20 units twice daily with meals (no hypoglycemia)  Patient enrolled in PAP with [place] for TRULICITY, [place] TO [facility] [place]; ships 60-monthsupplies to patient's home every quarter(due to back orders unlikely to ship more than 1 month at a time)   Order sent for CGM libre 3 to [facility] (via parachute  portal)  Current glucose readings: fasting glucose: <200, post prandial glucose: 200s Denies hypoglycemic/hyperglycemic symptoms Discussed meal planning options and Plate method for healthy eating Avoid sugary drinks and desserts Incorporate balanced protein, non starchy veggies, 1 serving of carbohydrate with each meal Increase water intake Increase physical activity as able Current exercise: patient unable due to physical condition Recommended increase glp1; work to optimize insulin regimen and POs for T2DM Assessed patient finances. Re enrollment and dose changes sent to [facility] patient assistance program--will follow BP checked today--118/76 (currently holding clonidine d/t hypotension)--encouraged patient to call if BP changes  Patient Goals/Self-Care Activities patient will:  - take medications as prescribed as evidenced by patient report and record review check glucose daily (3 times) or when symptomatic, document, and provide at future appointments collaborate with provider on medication access solutions engage in dietary modifications by   FOLLOWING A HEART HEALTHY DIET/HEALTHY PLATE METHOD       Follow Up:  Patient agrees to Care Plan and Follow-up.  Plan: Telephone follow up appointment with care management team member scheduled for:  3 MONTHS    JRegina Eck PharmD, BCPS Clinical Pharmacist, [place] Phone [phone]

## 2022-05-30 DIAGNOSIS — E1169 Type 2 diabetes mellitus with other specified complication: Secondary | ICD-10-CM

## 2022-05-30 DIAGNOSIS — Z794 Long term (current) use of insulin: Secondary | ICD-10-CM

## 2022-05-30 DIAGNOSIS — N184 Chronic kidney disease, stage 4 (severe): Secondary | ICD-10-CM

## 2022-06-17 ENCOUNTER — Ambulatory Visit (INDEPENDENT_AMBULATORY_CARE_PROVIDER_SITE_OTHER): Payer: Medicare Other

## 2022-06-17 DIAGNOSIS — Z23 Encounter for immunization: Secondary | ICD-10-CM | POA: Diagnosis not present

## 2022-06-19 ENCOUNTER — Ambulatory Visit: Payer: Self-pay | Admitting: *Deleted

## 2022-06-19 NOTE — Patient Outreach (Unsigned)
  Care Coordination   Follow Up Visit Note   06/19/2022 Name: Manuel Kline MRN: [id number] DOB: 01-29-1932  Manuel Kline is a 88 y.o. year old male who sees Dettinger, Fransisca Kaufmann, MD for primary care. I spoke with  Manuel Kline by phone today.  What matters to the patients health and wellness today?  He has a 3 at home   He had a waffle and Dexcom alarm. Taught him how to read label of the waffle for sugar he will half HgA1c 8 04/15/22 Encouraged him to use it as a tool waffle has 5 mg of sugar Syrup has 0 grams Has a necklace GPS that Better choices discussed son put the dexcom  Scheduled for flu shot and will [facility] has cov[facility]    Goals Addressed   None     SDOH assessments and interventions completed:  No{THN Tip this will not be part of the note when signed-REQUIRED REPORT FIELD DO NOT DELETE (Optional):27901}    Care Coordination Interventions Activated:  Yes {THN Tip this will not be part of the note when signed-REQUIRED REPORT FIELD DO NOT DELETE (Optional):27901} Care Coordination Interventions:  Yes, provided {[facility]N Tip this will not be part of the note when signed-REQUIRED REPORT FIELD DO NOT DELETE (Optional):27901}  Follow up plan: Follow up call scheduled for 07/20/22 0930    Encounter Outcome:  {ENCOUTCOME:27770} {THN Tip this will not be part of the note when signed-REQUIRED REPORT FIELD DO NOT DELETE (Optional):27901}  Vibha Ferdig L. Lavina Hamman, RN, BSN, [place] Coordinator Office number [phone]

## 2022-06-21 DIAGNOSIS — G4733 Obstructive sleep apnea (adult) (pediatric): Secondary | ICD-10-CM | POA: Diagnosis not present

## 2022-06-26 DIAGNOSIS — E1165 Type 2 diabetes mellitus with hyperglycemia: Secondary | ICD-10-CM | POA: Diagnosis not present

## 2022-07-01 DIAGNOSIS — E860 Dehydration: Secondary | ICD-10-CM

## 2022-07-01 HISTORY — DX: Dehydration: E86.0

## 2022-07-02 ENCOUNTER — Encounter: Payer: Self-pay | Admitting: Family Medicine

## 2022-07-08 ENCOUNTER — Telehealth: Payer: Self-pay | Admitting: Pharmacist

## 2022-07-08 ENCOUNTER — Ambulatory Visit: Payer: Medicare Other | Admitting: Pharmacist

## 2022-07-08 DIAGNOSIS — N184 Chronic kidney disease, stage 4 (severe): Secondary | ICD-10-CM

## 2022-07-08 DIAGNOSIS — E1169 Type 2 diabetes mellitus with other specified complication: Secondary | ICD-10-CM

## 2022-07-08 NOTE — Telephone Encounter (Signed)
Please send PAP re-enrollment to PCP office and patient for: Farxiga, trulicity, basaglar, Humulin N   Thank you!!

## 2022-07-13 ENCOUNTER — Encounter: Payer: Self-pay | Admitting: Family Medicine

## 2022-07-13 ENCOUNTER — Ambulatory Visit (INDEPENDENT_AMBULATORY_CARE_PROVIDER_SITE_OTHER): Payer: Medicare Other | Admitting: Family Medicine

## 2022-07-13 VITALS — BP 125/70 | HR 80 | Temp 97.4°F | Ht 64.0 in | Wt 218.0 lb

## 2022-07-13 DIAGNOSIS — E1142 Type 2 diabetes mellitus with diabetic polyneuropathy: Secondary | ICD-10-CM

## 2022-07-13 DIAGNOSIS — Z794 Long term (current) use of insulin: Secondary | ICD-10-CM

## 2022-07-13 DIAGNOSIS — E113393 Type 2 diabetes mellitus with moderate nonproliferative diabetic retinopathy without macular edema, bilateral: Secondary | ICD-10-CM | POA: Diagnosis not present

## 2022-07-13 DIAGNOSIS — I5022 Chronic systolic (congestive) heart failure: Secondary | ICD-10-CM | POA: Diagnosis not present

## 2022-07-13 DIAGNOSIS — N184 Chronic kidney disease, stage 4 (severe): Secondary | ICD-10-CM | POA: Diagnosis not present

## 2022-07-13 DIAGNOSIS — I10 Essential (primary) hypertension: Secondary | ICD-10-CM

## 2022-07-13 DIAGNOSIS — E782 Mixed hyperlipidemia: Secondary | ICD-10-CM | POA: Diagnosis not present

## 2022-07-13 DIAGNOSIS — E1169 Type 2 diabetes mellitus with other specified complication: Secondary | ICD-10-CM | POA: Diagnosis not present

## 2022-07-13 LAB — BAYER DCA HB A1C WAIVED: HB A1C (BAYER DCA - WAIVED): 7.2 % — ABNORMAL HIGH (ref 4.8–5.6)

## 2022-07-13 NOTE — Progress Notes (Signed)
BP 125/70   Pulse 80   Temp (!) 97.4 F (36.3 C)   Ht _0  (1.626 m)   Wt 218 lb (98.9 kg)   SpO2 95%   BMI 37.42 kg/m    Subjective:   Patient ID: Manuel Kline, male    DOB: 05-Apr-1932, 88 y.o.   MRN: [id number]  HPI: Manuel Kline is a 88 y.o. male presenting on 07/13/2022 for Medical Management of Chronic Issues, Hyperlipidemia, Hypertension, Diabetes, and Chronic Kidney Disease   HPI Type 2 diabetes mellitus Patient comes in today for recheck of his diabetes. Patient has been currently taking Basaglar 25 units daily and Humalog or 15 units twice daily with meals. Patient is currently on an ACE inhibitor/ARB. Patient has seen an ophthalmologist this year. Patient denies any issues with their feet. The symptom started onset as an adult hypertension and hyperlipidemia and neuropathy and retinopathy ARE RELATED TO DM   Hypertension Patient is currently on benazepril and clonidine and furosemide and metoprolol, and their blood pressure today is 125/70. Patient denies any lightheadedness or dizziness. Patient denies headaches, blurred vision, chest pains, shortness of breath, or weakness. Denies any side effects from medication and is content with current medication.   Hyperlipidemia Patient is coming in for recheck of his hyperlipidemia. The patient is currently taking fish oils and Crestor. They deny any issues with myalgias or history of liver damage from it. They deny any focal numbness or weakness or chest pain.   Relevant past medical, surgical, family and social history reviewed and updated as indicated. Interim medical history since our last visit reviewed. Allergies and medications reviewed and updated.  Review of Systems  Constitutional:  Negative for chills and fever.  Eyes:  Negative for visual disturbance.  Respiratory:  Negative for shortness of breath and wheezing.   Cardiovascular:  Negative for chest pain and leg swelling.  Musculoskeletal:  Negative for back pain  and gait problem.  Skin:  Negative for rash.  Neurological:  Negative for dizziness, weakness and light-headedness.  All other systems reviewed and are negative.   Per HPI unless specifically indicated above   Allergies as of 07/13/2022   No Known Allergies      Medication List        Accurate as of July 13, 2022  1:32 PM. If you have any questions, ask your nurse or doctor.          Accu-Chek Guide test strip Generic drug: glucose blood by Other route as needed for other.   allopurinol 300 MG tablet Commonly known as: ZYLOPRIM Take 1/2 (one-half) tablet by mouth once daily   ascorbic acid 500 MG tablet Commonly known as: VITAMIN C Take 500 mg by mouth daily.   Basaglar KwikPen 100 UNIT/ML Inject 0.25 mLs (25 Units total) into the skin daily.   benazepril 40 MG tablet Commonly known as: LOTENSIN Take 1 tablet (40 mg total) by mouth daily.   cloNIDine 0.1 MG tablet Commonly known as: CATAPRES Take 1 tablet (0.1 mg total) by mouth daily.   clopidogrel 75 MG tablet Commonly known as: PLAVIX Take 1 tablet (75 mg total) by mouth daily.   dapagliflozin propanediol 10 MG Tabs tablet Commonly known as: Farxiga Take 1 tablet (10 mg total) by mouth daily before breakfast.   DULoxetine 30 MG capsule Commonly known as: CYMBALTA Take 1 capsule (30 mg total) by mouth daily.   Fish Oil 1000 MG Cpdr Take 1 tablet by mouth daily.  fluticasone 0.05 % cream Commonly known as: CUTIVATE Apply 1 application. topically daily.   furosemide 40 MG tablet Commonly known as: LASIX Take 1 tablet (40 mg total) by mouth 2 (two) times daily.   gabapentin 300 MG capsule Commonly known as: NEURONTIN Take 1 capsule (300 mg total) by mouth 2 (two) times daily.   insulin regular 100 units/mL injection Commonly known as: HumuLIN R Inject 0.3 mLs (30 Units total) into the skin 2 (two) times daily before a meal.   Insulin Syringe-Needle U-100 31G X 5/16" 1 ML Misc Commonly  known as: RELION INSULIN SYRINGE 1ML/31G USE TO INJECT INSULIN TWICE DAILY AS DIRECTED. DX: E11.65   metoprolol succinate 50 MG 24 hr tablet Commonly known as: TOPROL-XL TAKE 1 TABLET BY MOUTH ONCE DAILY WITH OR IMMEDIATELY FOLLOWING A MEAL   MULTIVITAMIN ADULT PO Take 1 tablet by mouth daily.   nitroGLYCERIN 0.4 MG SL tablet Commonly known as: NITROSTAT Place 1 tablet (0.4 mg total) under the tongue every 5 (five) minutes as needed for chest pain.   polyethylene glycol powder 17 GM/SCOOP powder Commonly known as: GLYCOLAX/MIRALAX Take 17 g by mouth 2 (two) times daily as needed. What changed: reasons to take this   rosuvastatin 5 MG tablet Commonly known as: Crestor Take 1 tablet (5 mg total) by mouth daily.   Trulicity 3 FG/1.8EX Sopn Generic drug: Dulaglutide Inject 3 mg as directed once a week. [name] patient assistance program   VITAMIN B 12 PO Take 1,000 mcg by mouth daily.         Objective:   BP 125/70   Pulse 80   Temp (!) 97.4 F (36.3 C)   Ht _0  (1.626 m)   Wt 218 lb (98.9 kg)   SpO2 95%   BMI 37.42 kg/m   Wt Readings from Last 3 Encounters:  07/13/22 218 lb (98.9 kg)  04/09/22 225 lb (102.1 kg)  01/22/22 230 lb (104.3 kg)    Physical Exam Vitals and nursing note reviewed.  Constitutional:      General: He is not in acute distress.    Appearance: He is well-developed. He is not diaphoretic.  Eyes:     General: No scleral icterus.    Conjunctiva/sclera: Conjunctivae normal.  Neck:     Thyroid: No thyromegaly.  Cardiovascular:     Rate and Rhythm: Normal rate and regular rhythm.     Heart sounds: Normal heart sounds. No murmur heard. Pulmonary:     Effort: Pulmonary effort is normal. No respiratory distress.     Breath sounds: Normal breath sounds. No wheezing.  Musculoskeletal:        General: Normal range of motion.     Cervical back: Neck supple.  Lymphadenopathy:     Cervical: No cervical adenopathy.  Skin:    General: Skin  is warm and dry.     Findings: No rash.  Neurological:     Mental Status: He is alert and oriented to person, place, and time.     Coordination: Coordination normal.  Psychiatric:        Behavior: Behavior normal.       Assessment & Plan:   Problem List Items Addressed This Visit       Cardiovascular and Mediastinum   Chronic systolic heart failure (HCC) (Chronic)   Essential hypertension   Relevant Orders   CBC with Differential/Platelet   CMP14+EGFR   Lipid panel   Bayer DCA Hb A1c Waived     Endocrine  Type 2 diabetes mellitus with other specified complication (HCC) - Primary   Relevant Orders   CBC with Differential/Platelet   CMP14+EGFR   Lipid panel   Bayer DCA Hb A1c Waived   Diabetic peripheral neuropathy (HCC)   Moderate nonproliferative diabetic retinopathy of both eyes without macular edema associated with type 2 diabetes mellitus (HCC)     Genitourinary   CKD (chronic kidney disease) stage 4, GFR 15-29 ml/min (HCC)   Relevant Orders   CBC with Differential/Platelet   CMP14+EGFR   Lipid panel   Bayer DCA Hb A1c Waived     Other   Hyperlipidemia (Chronic)   Relevant Orders   CBC with Differential/Platelet   CMP14+EGFR   Lipid panel   Bayer DCA Hb A1c Waived    A1c much improved to 7.2.  He says he had some low blood sugars last week because he accidentally doubled up on his insulin but is doing better this week.  He will keep a close eye on it. Follow up plan: Return in about 3 months (around 10/13/2022), or if symptoms worsen or fail to improve, for Diabetes recheck.  Counseling provided for all of the vaccine components Orders Placed This Encounter  Procedures   CBC with Differential/Platelet   CMP14+EGFR   Lipid panel   Bayer DCA Hb A1c [place], MD [facility] 07/13/2022, 1:32 PM

## 2022-07-14 LAB — CMP14+EGFR
ALT: 18 IU/L (ref 0–44)
AST: 25 IU/L (ref 0–40)
Albumin/Globulin Ratio: 1.7 (ref 1.2–2.2)
Albumin: 4.6 g/dL (ref 3.7–4.7)
Alkaline Phosphatase: 116 IU/L (ref 44–121)
BUN/Creatinine Ratio: 19 (ref 10–24)
BUN: 46 mg/dL — ABNORMAL HIGH (ref 8–27)
Bilirubin Total: 0.5 mg/dL (ref 0.0–1.2)
CO2: 24 mmol/L (ref 20–29)
Calcium: 9.6 mg/dL (ref 8.6–10.2)
Chloride: 95 mmol/L — ABNORMAL LOW (ref 96–106)
Creatinine, Ser: 2.42 mg/dL — ABNORMAL HIGH (ref 0.76–1.27)
Globulin, Total: 2.7 g/dL (ref 1.5–4.5)
Glucose: 171 mg/dL — ABNORMAL HIGH (ref 70–99)
Potassium: 4.4 mmol/L (ref 3.5–5.2)
Sodium: 137 mmol/L (ref 134–144)
Total Protein: 7.3 g/dL (ref 6.0–8.5)
eGFR: 25 mL/min/{1.73_m2} — ABNORMAL LOW (ref >59)

## 2022-07-14 LAB — CBC WITH DIFFERENTIAL/PLATELET
Basophils Absolute: 0 10*3/uL (ref 0.0–0.2)
Basos: 1 %
EOS (ABSOLUTE): 0.2 10*3/uL (ref 0.0–0.4)
Eos: 3 %
Hematocrit: 43.1 % (ref 37.5–51.0)
Hemoglobin: 14.2 g/dL (ref 13.0–17.7)
Immature Grans (Abs): 0 10*3/uL (ref 0.0–0.1)
Immature Granulocytes: 0 %
Lymphocytes Absolute: 2.1 10*3/uL (ref 0.7–3.1)
Lymphs: 27 %
MCH: 30.5 pg (ref 26.6–33.0)
MCHC: 32.9 g/dL (ref 31.5–35.7)
MCV: 93 fL (ref 79–97)
Monocytes Absolute: 0.5 10*3/uL (ref 0.1–0.9)
Monocytes: 7 %
Neutrophils Absolute: 4.7 10*3/uL (ref 1.4–7.0)
Neutrophils: 62 %
Platelets: 204 10*3/uL (ref 150–450)
RBC: 4.66 x10E6/uL (ref 4.14–5.80)
RDW: 14.5 % (ref 11.6–15.4)
WBC: 7.6 10*3/uL (ref 3.4–10.8)

## 2022-07-14 LAB — LIPID PANEL
Chol/HDL Ratio: 4.3 ratio (ref 0.0–5.0)
Cholesterol, Total: 152 mg/dL (ref 100–199)
HDL: 35 mg/dL — ABNORMAL LOW (ref >39)
LDL Chol Calc (NIH): 70 mg/dL (ref 0–99)
Triglycerides: 291 mg/dL — ABNORMAL HIGH (ref 0–149)
VLDL Cholesterol Cal: 47 mg/dL — ABNORMAL HIGH (ref 5–40)

## 2022-07-14 MED ORDER — BASAGLAR KWIKPEN 100 UNIT/ML ~~LOC~~ SOPN: 25.0000 [IU] | PEN_INJECTOR | Freq: Every day | SUBCUTANEOUS | 5 refills | Status: DC

## 2022-07-14 MED ORDER — DAPAGLIFLOZIN PROPANEDIOL 10 MG PO TABS: 10.0000 mg | ORAL_TABLET | Freq: Every day | ORAL | 5 refills | Status: DC

## 2022-07-14 MED ORDER — INSULIN REGULAR HUMAN 100 UNIT/ML IJ SOLN: 30.0000 [IU] | Freq: Two times a day (BID) | INTRAMUSCULAR | 3 refills | Status: DC

## 2022-07-14 MED ORDER — TRULICITY 3 MG/0.5ML ~~LOC~~ SOAJ: 3.0000 mg | SUBCUTANEOUS | 11 refills | Status: DC

## 2022-07-14 NOTE — Progress Notes (Signed)
  Care Management   Follow Up Note   07/08/2022 Name: Manuel Kline MRN: [id number] DOB: [birth year]   Referred by: Dettinger, Fransisca Kaufmann, MD Reason for referral : Chronic Care Management and Diabetes  PAP re-enrollment info forwarded to [facility], CPhT for patient assistance.  Reviewed chart and patient is currently stable on regimen.  PCP visit on 07/13/2022.    A1c 7.2% and GFR 25.All patient assistance refills have be escribed to the appropriate pharmacy: Trulicity, basaglar, humalog --> [facility] specialty mail order [place] -->[place] PAP, [facility] mail order  Follow Up Plan: Telephone follow up appointment with care management team member scheduled for:1 month    Regina Eck, PharmD, BCPS Clinical Pharmacist, [place]  II Phone [phone]

## 2022-07-20 ENCOUNTER — Ambulatory Visit: Payer: Self-pay | Admitting: *Deleted

## 2022-07-20 NOTE — Telephone Encounter (Signed)
MAILED APPS TO PT'S HOME

## 2022-07-20 NOTE — Patient Outreach (Unsigned)
Care Coordination   Follow Up Visit Note   07/20/2022 Name: Manuel Kline MRN: [id number] DOB: 1931-09-09  Manuel Kline is a 88 y.o. year old male who sees Dettinger, Fransisca Kaufmann, MD for primary care. I spoke with  Manuel Kline by phone today.  What matters to the patients health and wellness today?  Managing diabetes and diarrhea  Diabetes  Now has the [employer] 3 cause he did not have a smart phone only flip phone   CBG readings on machine remaining green   During his 07/13/22 office visit it was reported that his A1c was much improved to 7.2.  He says he had some low blood sugars last week because he accidentally doubled up on his insulin but is doing better this week.  Manuel Kline, wife is still a wonderful support system   Last 4 nights had diarrhea  happens generally at night around 2 am once  has general has only one loose stool  He wears a depends at night and was afraid to go to his church service on 07/19/22 related to diarrhea He confirms he eats night snacks like apple or fruit  He will try to take in a protein snack like adding peanut butter to his apple or cheese and crackers  encouraged put peanut butter on apple or hard boil egg, cheese/cracker, protein bar protein this am   Agrees to follow up on diarrhea on 07/21/22   Goals Addressed               This Visit's Progress     Patient Stated     Manage Diabetes [facility]) (pt-stated)   On track     Care Coordination Interventions: Discussed plans with patient for ongoing care management follow up and provided patient with direct contact information for care management team Review of patient status, including review of consultants reports, relevant laboratory and other test results, and medications completed Assessed social determinant of health barriers Follow up with patien has a Freestyle Libre 3 and blood sugars have improved so has his  A1C to 7.2       manage diarrhea ([facility]) (pt-stated)   Not on track      Care Coordination Interventions: Evaluation of current treatment plan related to diarrhea at night and patient's adherence to plan as established by provider Reviewed medications with patient and discussed imodium use and protein night snacks Discussed plans with patient for ongoing care management follow up and provided patient with direct contact information for care management team Reviewed a list of protein snacks to bulk up stool      T2DM, HLD PHARMD GOAL (pt-stated)        Current Barriers:  Unable to independently afford treatment regimen Unable to maintain control of T2DM Suboptimal therapeutic regimen for T2DM  Pharmacist Clinical Goal(s):  patient will verbalize ability to afford treatment regimen achieve control of T2DM as evidenced by GOAL A1C<7% adhere to plan to optimize therapeutic regimen for T2DM as evidenced by report of adherence to recommended medication management changes through collaboration with PharmD and provider.   Interventions: 1:1 collaboration with Dettinger, Fransisca Kaufmann, MD regarding development and update of comprehensive plan of care as evidenced by provider attestation and co-signature Inter-disciplinary care team collaboration (see longitudinal plan of care) Comprehensive medication review performed; medication list updated in electronic medical record  Diabetes: Goal on track: NO. Uncontrolled-A1C 7.7-->8%% (okay for 15 yoM), GFR 26-->19;  Current treatment: meds were initially chosen prior to pharmd  appt due to cost/patient comfort; we are working to optimize them CONTINUE TRULICITY TO '3MG'$  SQ WEEKLY  Denies personal and family history of Medullary thyroid cancer (MTC) Continue Basaglar 25 units once daily (patient has been taking twice daily) CONTINUE FARXIGA '10MG'$  DAILY-okay to continue given GFR 19 ENROLLED IN [place] PATIENT ASSISTANCE PROGRAM--(escribed to [facility]) CONTINUE Humulin R to 10-20 units twice daily with meals (no hypoglycemia)  Patient  enrolled in PAP with [place] for TRULICITY, [place] [facility] PER PROGRAM; ships 73-monthsupplies to patient's home every quarter(due to back orders unlikely to ship more than 1 month at a time)   Order sent for CGM libre 3 to advanced diabetes supply (via parach[employer] portal)  Current glucose readings: fasting glucose: <200, post prandial glucose: 200s Denies hypoglycemic/hyperglycemic symptoms Discussed meal planning options and Plate method for healthy eating Avoid sugary drinks and desserts Incorporate balanced protein, non starchy veggies, 1 serving of carbohydrate with each meal Increase water intake Increase physical activity as able Current exercise: patient unable due to physical condition Recommended increase glp1; work to optimize insulin regimen and POs for T2DM Assessed patient finances. Re enrollment and dose changes sent to [facility] patient assistance program--will follow BP checked today--118/76 (currently holding clonidine d/t hypotension)--encouraged patient to call if BP changes  Patient Goals/Self-Care Activities patient will:  - take medications as prescribed as evidenced by patient report and record review check glucose daily (3 times) or when symptomatic, document, and provide at future appointments collaborate with provider on medication access solutions engage in dietary modifications by FOLLOWING A HEART HEALTHY DIET/HEALTHY PLATE METHOD          SDOH assessments and interventions completed:  Yes{THN Tip this will not be part of the note when signed-REQUIRED REPORT FIELD DO NOT DELETE (Optional):27901}  SDOH Interventions Today    Flowsheet Row Most Recent Value  SDOH Interventions   Food Insecurity Interventions Intervention Not Indicated  Housing Interventions Intervention Not Indicated  Transportation Interventions Intervention Not Indicated  Financial Strain Interventions Intervention Not  Indicated        Care Coordination Interventions Activated:  Yes {THN Tip this will not be part of the note when signed-REQUIRED REPORT FIELD DO NOT DELETE (Optional):27901} Care Coordination Interventions:  Yes, provided {THN Tip this will not be part of the note when signed-REQUIRED REPORT FIELD DO NOT DELETE (Optional):27901}  Follow up plan: Follow up call scheduled for 07/21/22     Encounter Outcome:  Pt. Visit Completed {THN Tip this will not be part of the note when signed-REQUIRED REPORT FIELD DO NOT DELETE (Optional):27901}  Manuel Kaman L. GLavina Hamman RN, BSN, CBaldwinCoordinator Office number [phone]

## 2022-07-21 ENCOUNTER — Other Ambulatory Visit: Payer: Self-pay

## 2022-07-21 ENCOUNTER — Observation Stay (HOSPITAL_COMMUNITY)
Admission: EM | Admit: 2022-07-21 | Discharge: 2022-07-22 | Disposition: A | Discharge status: Home / Self Care | Payer: Medicare Other | Attending: Family Medicine | Admitting: Family Medicine

## 2022-07-21 ENCOUNTER — Emergency Department (HOSPITAL_COMMUNITY): Payer: Medicare Other

## 2022-07-21 DIAGNOSIS — N184 Chronic kidney disease, stage 4 (severe): Secondary | ICD-10-CM | POA: Diagnosis not present

## 2022-07-21 DIAGNOSIS — G4733 Obstructive sleep apnea (adult) (pediatric): Secondary | ICD-10-CM | POA: Diagnosis not present

## 2022-07-21 DIAGNOSIS — K6389 Other specified diseases of intestine: Secondary | ICD-10-CM | POA: Diagnosis not present

## 2022-07-21 DIAGNOSIS — E1122 Type 2 diabetes mellitus with diabetic chronic kidney disease: Secondary | ICD-10-CM | POA: Insufficient documentation

## 2022-07-21 DIAGNOSIS — Z951 Presence of aortocoronary bypass graft: Secondary | ICD-10-CM | POA: Insufficient documentation

## 2022-07-21 DIAGNOSIS — E1169 Type 2 diabetes mellitus with other specified complication: Secondary | ICD-10-CM | POA: Diagnosis not present

## 2022-07-21 DIAGNOSIS — Z85038 Personal history of other malignant neoplasm of large intestine: Secondary | ICD-10-CM | POA: Insufficient documentation

## 2022-07-21 DIAGNOSIS — R197 Diarrhea, unspecified: Secondary | ICD-10-CM | POA: Diagnosis not present

## 2022-07-21 DIAGNOSIS — Z95810 Presence of automatic (implantable) cardiac defibrillator: Secondary | ICD-10-CM | POA: Diagnosis present

## 2022-07-21 DIAGNOSIS — Z79899 Other long term (current) drug therapy: Secondary | ICD-10-CM | POA: Diagnosis not present

## 2022-07-21 DIAGNOSIS — R112 Nausea with vomiting, unspecified: Secondary | ICD-10-CM | POA: Diagnosis not present

## 2022-07-21 DIAGNOSIS — I130 Hypertensive heart and chronic kidney disease with heart failure and stage 1 through stage 4 chronic kidney disease, or unspecified chronic kidney disease: Secondary | ICD-10-CM | POA: Diagnosis not present

## 2022-07-21 DIAGNOSIS — Z7902 Long term (current) use of antithrombotics/antiplatelets: Secondary | ICD-10-CM | POA: Insufficient documentation

## 2022-07-21 DIAGNOSIS — Z1152 Encounter for screening for COVID-19: Secondary | ICD-10-CM | POA: Diagnosis not present

## 2022-07-21 DIAGNOSIS — R1084 Generalized abdominal pain: Secondary | ICD-10-CM | POA: Diagnosis not present

## 2022-07-21 DIAGNOSIS — Z794 Long term (current) use of insulin: Secondary | ICD-10-CM | POA: Diagnosis not present

## 2022-07-21 DIAGNOSIS — I10 Essential (primary) hypertension: Secondary | ICD-10-CM | POA: Diagnosis present

## 2022-07-21 DIAGNOSIS — I255 Ischemic cardiomyopathy: Secondary | ICD-10-CM | POA: Diagnosis not present

## 2022-07-21 DIAGNOSIS — R109 Unspecified abdominal pain: Secondary | ICD-10-CM | POA: Diagnosis not present

## 2022-07-21 DIAGNOSIS — Z7984 Long term (current) use of oral hypoglycemic drugs: Secondary | ICD-10-CM | POA: Insufficient documentation

## 2022-07-21 DIAGNOSIS — I5022 Chronic systolic (congestive) heart failure: Secondary | ICD-10-CM | POA: Diagnosis present

## 2022-07-21 DIAGNOSIS — E1142 Type 2 diabetes mellitus with diabetic polyneuropathy: Secondary | ICD-10-CM | POA: Diagnosis not present

## 2022-07-21 DIAGNOSIS — I700 Atherosclerosis of aorta: Secondary | ICD-10-CM | POA: Diagnosis not present

## 2022-07-21 DIAGNOSIS — Z7985 Long-term (current) use of injectable non-insulin antidiabetic drugs: Secondary | ICD-10-CM | POA: Insufficient documentation

## 2022-07-21 DIAGNOSIS — I2510 Atherosclerotic heart disease of native coronary artery without angina pectoris: Secondary | ICD-10-CM | POA: Diagnosis not present

## 2022-07-21 LAB — CBC WITH DIFFERENTIAL/PLATELET
Abs Immature Granulocytes: 0.04 10*3/uL (ref 0.00–0.07)
Basophils Absolute: 0 10*3/uL (ref 0.0–0.1)
Basophils Relative: 0 %
Eosinophils Absolute: 0.2 10*3/uL (ref 0.0–0.5)
Eosinophils Relative: 2 %
HCT: 39.7 % (ref 39.0–52.0)
Hemoglobin: 13.3 g/dL (ref 13.0–17.0)
Immature Granulocytes: 0 %
Lymphocytes Relative: 13 %
Lymphs Abs: 1.3 10*3/uL (ref 0.7–4.0)
MCH: 30.6 pg (ref 26.0–34.0)
MCHC: 33.5 g/dL (ref 30.0–36.0)
MCV: 91.5 fL (ref 80.0–100.0)
Monocytes Absolute: 0.6 10*3/uL (ref 0.1–1.0)
Monocytes Relative: 6 %
Neutro Abs: 7.8 10*3/uL — ABNORMAL HIGH (ref 1.7–7.7)
Neutrophils Relative %: 79 %
Platelets: 172 10*3/uL (ref 150–400)
RBC: 4.34 MIL/uL (ref 4.22–5.81)
RDW: 14.8 % (ref 11.5–15.5)
WBC: 10.1 10*3/uL (ref 4.0–10.5)
nRBC: 0 % (ref 0.0–0.2)

## 2022-07-21 LAB — COMPREHENSIVE METABOLIC PANEL
ALT: 18 U/L (ref 0–44)
AST: 27 U/L (ref 15–41)
Albumin: 3.9 g/dL (ref 3.5–5.0)
Alkaline Phosphatase: 82 U/L (ref 38–126)
Anion gap: 13 (ref 5–15)
BUN: 68 mg/dL — ABNORMAL HIGH (ref 8–23)
CO2: 22 mmol/L (ref 22–32)
Calcium: 9.2 mg/dL (ref 8.9–10.3)
Chloride: 102 mmol/L (ref 98–111)
Creatinine, Ser: 3.25 mg/dL — ABNORMAL HIGH (ref 0.61–1.24)
GFR, Estimated: 18 mL/min — ABNORMAL LOW (ref >60)
Glucose, Bld: 171 mg/dL — ABNORMAL HIGH (ref 70–99)
Potassium: 4.2 mmol/L (ref 3.5–5.1)
Sodium: 137 mmol/L (ref 135–145)
Total Bilirubin: 0.4 mg/dL (ref 0.3–1.2)
Total Protein: 6.8 g/dL (ref 6.5–8.1)

## 2022-07-21 LAB — LIPASE, BLOOD: Lipase: 36 U/L (ref 11–51)

## 2022-07-21 LAB — RESP PANEL BY RT-PCR (FLU A&B, COVID) ARPGX2
Influenza A by PCR: NEGATIVE
Influenza B by PCR: NEGATIVE
SARS Coronavirus 2 by RT PCR: NEGATIVE

## 2022-07-21 LAB — LACTIC ACID, PLASMA: Lactic Acid, Venous: 1.1 mmol/L (ref 0.5–1.9)

## 2022-07-21 MED ORDER — LACTATED RINGERS IV SOLN: INTRAVENOUS | Status: AC

## 2022-07-21 MED ORDER — FAMOTIDINE IN NACL 20-0.9 MG/50ML-% IV SOLN
20.0000 mg | Freq: Once | INTRAVENOUS | Status: AC
  Administered 2022-07-21: 20 mg via INTRAVENOUS
  Filled 2022-07-21: qty 50

## 2022-07-21 MED ORDER — SODIUM CHLORIDE 0.9 % IV BOLUS
1000.0000 mL | Freq: Once | INTRAVENOUS | Status: AC
  Administered 2022-07-21: 1000 mL via INTRAVENOUS

## 2022-07-21 NOTE — Assessment & Plan Note (Signed)
Chronic Stable 

## 2022-07-21 NOTE — Assessment & Plan Note (Signed)
Check GI diarrhea panel. Enteric precautions.

## 2022-07-21 NOTE — Assessment & Plan Note (Signed)
Slightly worse than baseline of 3.0. continue with IVF overnight. Repeat BMP in AM.

## 2022-07-21 NOTE — Assessment & Plan Note (Signed)
Stable. Hold lotension due to mild increase in his Scr.

## 2022-07-21 NOTE — Assessment & Plan Note (Signed)
Add ssi.

## 2022-07-21 NOTE — H&P (Signed)
History and Physical    LENNOX DOLBERRY WTU:[id number] DOB: 1932-02-07 DOA: 07/21/2022  DOS: the patient was seen and examined on 07/21/2022  PCP: Dettinger, Fransisca Kaufmann, MD   Patient coming from: Home  I have personally briefly reviewed patient's old medical records in [place]  CC: N/V x 1 day,  diarrhea x 6 days HPI: 88 year old male history of chronic systolic heart failure/ischemic cardiomyopathy EF of 45 to 50%, diabetes, hypertension, CKD stage IV, OSA, history of ICD placement, coronary disease, diabetic peripheral neuropathy, presents to the ER today with a 1 day history of nausea vomiting and diarrhea x6 days.  Patient states that he had some chicken salad from [employer] on Thursday evening around 5 PM.  He started vomiting around 2 AM Friday morning.  He has been having diarrheal stools for the last several days.  He started with nausea vomiting today.  Presented to ER with his family to continue to nausea vomiting.  No other ill contacts.  Family did not eat the chicken salad.  No fevers.  He did state that he was feeling chilled while he was vomiting.  Labs showed a white count of 10.1, hemoglobin of 13.3 Lipase of 36  BUN of 68, creatinine 3.25.  Baseline creatinine approximately 3.0   CT abdomen pelvis demonstrated thickened appearance of the gastric wall.  There is air extending to the gastric mucosa in the gastric wall predominantly the gastric fundus and body of the stomach.  There is gas in the short gastric vein as well as a portal venous gas in the left lobe of the liver.  Concern for bowel ischemia involving the stomach.  General surgery has been consulted.  [facility] consulted for admission.   ED Course: CT pelvis shows pneumatosis of the stomach.  Review of Systems:  Review of Systems  Constitutional:  Positive for chills. Negative for fever.  HENT: Negative.    Eyes: Negative.   Respiratory: Negative.    Cardiovascular: Negative.    Gastrointestinal:  Positive for diarrhea, nausea and vomiting.  Genitourinary: Negative.   Musculoskeletal: Negative.   Skin: Negative.   Neurological: Negative.   Endo/Heme/Allergies: Negative.   Psychiatric/Behavioral: Negative.    All other systems reviewed and are negative.   Past Medical History:  Diagnosis Date   Adenomatous colon polyp 2006   CAD (coronary artery disease)    Calcium oxalate renal stones    Cardiomyopathy    Cataract    Cataract    Cerebral embolism with cerebral infarction 1/79/1505   Chronic systolic heart failure (HCC)         CKD (chronic kidney disease) stage 4, GFR 15-29 ml/min ([place]) 11/08/2017   Claudication in peripheral vascular disease ([place]) 07/23/2020   Diabetes ([place])    Diabetic peripheral neuropathy ([place]) 01/31/2015   Dual implantable cardioverter-defibrillator in situ    01/16/2002 Dr. Lovena Le  RA lead  Guidant [id number] RV lead  Guidant [id number] Generator  Guidant Prism  09/02/2009 Generator change Medtronic [id number]  SN  [id number]      Erectile dysfunction    Erosive esophagitis    Essential hypertension 11/30/2015   Hemorrhoids    History of TIA (transient ischemic attack) 01/19/2019   HLD (hyperlipidemia)    HTN (hypertension)    Hyperlipidemia    Hypertensive heart disease without CHF 07/31/2011   ICD (implantable cardiac defibrillator) in place    ICD dual chamber in situ    Ischemic cardiomyopathy    EF  40% June 2013    Left-sided weakness 12/25/8339   Metabolic syndrome    Moderate nonproliferative diabetic retinopathy of both eyes without macular edema associated with type 2 diabetes mellitus ([place]) 02/20/2020   Morbid obesity ([place])    OSA on CPAP 11/05/2014   Osteoarthritis    Posterior vitreous detachment of both eyes 02/20/2020   Presence of automatic (implantable) cardiac defibrillator 11/08/2017   S/P CABG (coronary artery bypass graft) 11/02/2000   Sleep apnea    Stroke-like symptoms 12/26/2018   Syncope 08/06/2009   Qualifier:  Diagnosis of  By: Selena Batten CMA, [place]     Type 2 diabetes, uncontrolled, with neuropathy    Has retinopahty and neuropathy     Ventricular tachycardia (paroxysmal) ([place]) 07/14/2016    Past Surgical History:  Procedure Laterality Date   ABDOMINAL EXPLORATION SURGERY     BACK SURGERY     X'3   cardiac bypass     CARDIAC DEFIBRILLATOR PLACEMENT     CARPAL TUNNEL RELEASE     X2, bilateral   CATARACT EXTRACTION     COLONOSCOPY  06/20/2012   Procedure: COLONOSCOPY;  Surgeon: Sable Feil, MD;  Location: [facility];  Service: Endoscopy;  Laterality: N/A;   DOPPLER ECHOCARDIOGRAPHY  2003   EP IMPLANTABLE DEVICE N/A 07/14/2016   Procedure: ICD Generator Changeout;  Surgeon: Evans Lance, MD;  Location: [facility];  Service: Cardiovascular;  Laterality: N/A;   ESOPHAGOGASTRODUODENOSCOPY  06/20/2012   Procedure: ESOPHAGOGASTRODUODENOSCOPY (EGD);  Surgeon: Sable Feil, MD;  Location: [facility];  Service: Endoscopy;  Laterality: N/A;   EYE SURGERY     LAPAROTOMY     RETINOPATHY SURGERY Bilateral    rotator cuff surgery     left     reports that he has never smoked. He has never used smokeless tobacco. He reports that he does not drink alcohol and does not use drugs.  No Known Allergies  Family History  Problem Relation Age of Onset   Diabetes Brother    Heart disease Father    Heart disease Mother    Diabetes Mother    Diabetes Brother    Diabetes Brother    Cancer Brother        baldder   Heart disease Brother    Diabetes Sister    Diabetes Sister    Kidney disease Sister        dialysis   Diabetes Sister    Diabetes Sister    Diabetes Sister    Throat cancer Paternal Uncle     Prior to Admission medications   Medication Sig Start Date End Date Taking? Authorizing Provider  ACCU-CHEK GUIDE test strip by Other route as needed for other. 07/31/21   [provider]  allopurinol (ZYLOPRIM) 300 MG tablet Take 1/2 (one-half) tablet by mouth once  daily 04/09/22   Dettinger, Fransisca Kaufmann, MD  benazepril (LOTENSIN) 40 MG tablet Take 1 tablet (40 mg total) by mouth daily. 09/30/21   Dettinger, Fransisca Kaufmann, MD  cloNIDine (CATAPRES) 0.1 MG tablet Take 1 tablet (0.1 mg total) by mouth daily. 01/22/22   Park Liter, MD  clopidogrel (PLAVIX) 75 MG tablet Take 1 tablet (75 mg total) by mouth daily. 04/09/22   Dettinger, Fransisca Kaufmann, MD  Cyanocobalamin (VITAMIN B 12 PO) Take 1,000 mcg by mouth daily.    [provider]  dapagliflozin propanediol (FARXIGA) 10 MG TABS tablet Take 1 tablet (10 mg total) by mouth daily before breakfast. 07/14/22  Dettinger, Fransisca Kaufmann, MD  Dulaglutide (TRULICITY) 3 DP/8.2UM SOPN Inject 3 mg as directed once a week. [name] patient assistance program 07/14/22   Dettinger, Fransisca Kaufmann, MD  DULoxetine (CYMBALTA) 30 MG capsule Take 1 capsule (30 mg total) by mouth daily. 04/09/22   Dettinger, Fransisca Kaufmann, MD  fluticasone (CUTIVATE) 0.05 % cream Apply 1 application. topically daily. 11/11/21   [provider]  furosemide (LASIX) 40 MG tablet Take 1 tablet (40 mg total) by mouth 2 (two) times daily. 04/09/22   Dettinger, Fransisca Kaufmann, MD  gabapentin (NEURONTIN) 300 MG capsule Take 1 capsule (300 mg total) by mouth 2 (two) times daily. 04/09/22   Dettinger, Fransisca Kaufmann, MD  Insulin Glargine [facility] KWIKPEN) 100 UNIT/ML Inject 25 Units into the skin daily. 07/14/22   Dettinger, Fransisca Kaufmann, MD  insulin regular (HUMULIN R) 100 units/mL injection Inject 0.3 mLs (30 Units total) into the skin 2 (two) times daily before a meal. 07/14/22   Dettinger, Fransisca Kaufmann, MD  Insulin Syringe-Needle U-100 (RELION INSULIN SYRINGE 1ML/31G) 31G X 5/16" 1 ML MISC USE TO INJECT INSULIN TWICE DAILY AS DIRECTED. DX: E11.65 02/05/22   Dettinger, Fransisca Kaufmann, MD  metoprolol succinate (TOPROL-XL) 50 MG 24 hr tablet TAKE 1 TABLET BY MOUTH ONCE DAILY WITH OR IMMEDIATELY FOLLOWING A MEAL 04/09/22   Dettinger, Fransisca Kaufmann, MD  Multiple Vitamin (MULTIVITAMIN ADULT PO) Take 1  tablet by mouth daily.    [provider]  nitroGLYCERIN (NITROSTAT) 0.4 MG SL tablet Place 1 tablet (0.4 mg total) under the tongue every 5 (five) minutes as needed for chest pain. 04/09/22   Dettinger, Fransisca Kaufmann, MD  Omega-3 Fatty Acids (FISH OIL) 1000 MG CPDR Take 1 tablet by mouth daily.    [provider]  polyethylene glycol powder (GLYCOLAX/MIRALAX) powder Take 17 g by mouth 2 (two) times daily as needed. Patient taking differently: Take 17 g by mouth 2 (two) times daily as needed for mild constipation or moderate constipation. 12/22/17   Eustaquio Maize, MD  rosuvastatin (CRESTOR) 5 MG tablet Take 1 tablet (5 mg total) by mouth daily. 10/02/21   Dettinger, Fransisca Kaufmann, MD  vitamin C (ASCORBIC ACID) 500 MG tablet Take 500 mg by mouth daily.    [provider]    Physical Exam: Vitals:   07/21/22 1945 07/21/22 2015 07/21/22 2045 07/21/22 2100  BP: 114/68 119/69 120/66 110/66  Pulse: (!) 59 (!) 59 (!) 59 60  Resp: '14 16 15 17  '$ SpO2: 99% 97% 97% 96%    Physical Exam Vitals and nursing note reviewed.  Constitutional:      Appearance: Normal appearance. He is obese.  HENT:     Head: Normocephalic and atraumatic.     Nose: Nose normal.  Cardiovascular:     Rate and Rhythm: Normal rate and regular rhythm.     Pulses: Normal pulses.  Pulmonary:     Effort: Pulmonary effort is normal.     Breath sounds: Normal breath sounds.  Abdominal:     General: Bowel sounds are normal. There is no distension.     Palpations: Abdomen is soft.    Musculoskeletal:     Right lower leg: No edema.     Left lower leg: No edema.  Skin:    General: Skin is warm and dry.     Capillary Refill: Capillary refill takes less than 2 seconds.  Neurological:     Mental Status: He is alert and oriented to person, place, and time.  Labs on Admission: I have personally reviewed following labs and imaging studies  CBC: Recent Labs  Lab 07/21/22 1947  WBC 10.1  NEUTROABS 7.8*   HGB 13.3  HCT 39.7  MCV 91.5  PLT 062   Basic Metabolic Panel: Recent Labs  Lab 07/21/22 1947  NA 137  K 4.2  CL 102  CO2 22  GLUCOSE 171*  BUN 68*  CREATININE 3.25*  CALCIUM 9.2   GFR: Estimated Creatinine Clearance: 16.7 mL/min (A) (by C-G formula based on SCr of 3.25 mg/dL (H)). Liver Function Tests: Recent Labs  Lab 07/21/22 1947  AST 27  ALT 18  ALKPHOS 82  BILITOT 0.4  PROT 6.8  ALBUMIN 3.9   Recent Labs  Lab 07/21/22 1947  LIPASE 36   No results for input(s): "AMMONIA" in the last 168 hours. Coagulation Profile: No results for input(s): "INR", "PROTIME" in the last 168 hours. Cardiac Enzymes: No results for input(s): "CKTOTAL", "CKMB", "CKMBINDEX", "TROPONINI", "TROPONINIHS" in the last 168 hours. BNP (last 3 results) No results for input(s): "PROBNP" in the last 8760 hours. HbA1C: No results for input(s): "HGBA1C" in the last 72 hours. CBG: No results for input(s): "GLUCAP" in the last 168 hours. Lipid Profile: No results for input(s): "CHOL", "HDL", "LDLCALC", "TRIG", "CHOLHDL", "LDLDIRECT" in the last 72 hours. Thyroid Function Tests: No results for input(s): "TSH", "T4TOTAL", "FREET4", "T3FREE", "THYROIDAB" in the last 72 hours. Anemia Panel: No results for input(s): "VITAMINB12", "FOLATE", "FERRITIN", "TIBC", "IRON", "RETICCTPCT" in the last 72 hours. Urine analysis:    Component Value Date/Time   COLORURINE YELLOW 12/25/2018 1548   APPEARANCEUR Clear 12/24/2020 1537   LABSPEC 1.010 12/25/2018 1548   PHURINE 7.0 12/25/2018 1548   GLUCOSEU Trace (A) 12/24/2020 1537   HGBUR NEGATIVE 12/25/2018 1548   BILIRUBINUR Negative 12/24/2020 1537   KETONESUR NEGATIVE 12/25/2018 1548   PROTEINUR Negative 12/24/2020 1537   PROTEINUR NEGATIVE 12/25/2018 1548   UROBILINOGEN negative 11/01/2015 1123   NITRITE Negative 12/24/2020 1537   NITRITE NEGATIVE 12/25/2018 1548   LEUKOCYTESUR Negative 12/24/2020 1537   LEUKOCYTESUR NEGATIVE 12/25/2018 1548     Radiological Exams on Admission: I have personally reviewed images CT ABDOMEN PELVIS WO CONTRAST  Result Date: 07/21/2022 CLINICAL DATA:  Abdominal pain. EXAM: CT ABDOMEN AND PELVIS WITHOUT CONTRAST TECHNIQUE: Multidetector CT imaging of the abdomen and pelvis was performed following the standard protocol without IV contrast. RADIATION DOSE REDUCTION: This exam was performed according to the departmental dose-optimization program which includes automated exposure control, adjustment of the mA and/or kV according to patient size and/or use of iterative reconstruction technique. COMPARISON:  CT of the chest abdomen pelvis dated 12/25/2018. FINDINGS: Evaluation of this exam is limited in the absence of intravenous contrast. Lower chest: Minimal bibasilar subpleural atelectasis. The visualized lung bases are otherwise clear. There is advanced 3 vessel coronary vascular calcification and postsurgical changes of CABG. A cardiac pacemaker is noted. No intra-abdominal free air or free fluid. Hepatobiliary: The liver is unremarkable. No biliary dilatation. Multiple small stones within the gallbladder. No pericholecystic fluid or evidence of acute cholecystitis by CT. Pancreas: Unremarkable. No pancreatic ductal dilatation or surrounding inflammatory changes. Spleen: Normal in size without focal abnormality. Adrenals/Urinary Tract: The adrenal glands unremarkable. Mild bilateral renal parenchyma atrophy. There is no hydronephrosis or nephrolithiasis on either side. The visualized ureters and urinary bladder appear unremarkable. Stomach/Bowel: There is thickened appearance of the gastric wall. There is air extending to the gastric mucosa and gastric wall predominantly in the gastric fundus and  body of the stomach. There is gas within the short gastric veins as well as portal venous gas in the left lobe of the liver. Findings suspicious for bowel ischemia involving the stomach. Clinical correlation and surgical  consult is advised. There is no bowel obstruction. The appendix is normal. Vascular/Lymphatic: Advanced aortoiliac atherosclerotic disease. The IVC is unremarkable. No portal venous gas. There is no adenopathy. The Reproductive: Prostate and seminal vesicles are grossly unremarkable. No acute mass. Other: None Musculoskeletal: Degenerative changes of the spine. No acute osseous pathology. IMPRESSION: 1. Findings suspicious for bowel ischemia involving the stomach. Clinical correlation and surgical consult is advised. 2. Cholelithiasis. 3. No hydronephrosis or nephrolithiasis. 4.  Aortic Atherosclerosis (ICD10-I70.0). These results were called by telephone at the time of interpretation on 07/21/2022 at 9:58 pm to provider Wynona Dove , who verbally acknowledged these results. Electronically Signed   By: Anner Crete M.D.   On: 07/21/2022 22:16    EKG: My personal interpretation of EKG shows: LBBB, NSR    Assessment/Plan Principal Problem:   Pneumatosis of intestines Active Problems:   Diarrhea   N&V (nausea and vomiting)   Type 2 diabetes mellitus with other specified complication (HCC)   Ischemic cardiomyopathy   Chronic systolic heart failure (HCC)   CAD (coronary artery disease)   Dual implantable cardioverter-defibrillator in situ   OSA on CPAP   Diabetic peripheral neuropathy (HCC)   Essential hypertension   CKD (chronic kidney disease) stage 4, GFR 15-29 ml/min (HCC)    Assessment and Plan: * Pneumatosis of intestines Observation med/surg bed. General surgery recommended pt to be admitted. They will continue to follow and make recommendations tomorrow. Clear liquid diet. IVF overnight.  N&V (nausea and vomiting) Started today. Improved after IVF and antiemetics.  Diarrhea Check GI diarrhea panel. Enteric precautions.  CKD (chronic kidney disease) stage 4, GFR 15-29 ml/min (HCC) Slightly worse than baseline of 3.0. continue with IVF overnight. Repeat BMP in AM.  Essential  hypertension Stable. Hold lotension due to mild increase in his Scr.  Diabetic peripheral neuropathy (HCC) Stable.  OSA on CPAP Stable.  Dual implantable cardioverter-defibrillator in situ Chronic. Stable.  CAD (coronary artery disease) Stable.  Chronic systolic heart failure (HCC) Chronic. Stable.  Ischemic cardiomyopathy Chronic. Stable.  Type 2 diabetes mellitus with other specified complication ([place]) Add ssi.   DVT prophylaxis: SQ Heparin Code Status: Full Code Family Communication: discussed with pt, pt's dtr at bedside  Disposition Plan: return home  Consults called: EDP has consulted general surgery  Admission status: Observation, Med-Surg   Kristopher Oppenheim, DO [facility] 07/21/2022, 11:16 PM

## 2022-07-21 NOTE — Subjective & Objective (Signed)
CC: N/V x 1 day,  diarrhea x 6 days HPI: 88 year old male history of chronic systolic heart failure/ischemic cardiomyopathy EF of 45 to 50%, diabetes, hypertension, CKD stage IV, OSA, history of ICD placement, coronary disease, diabetic peripheral neuropathy, presents to the ER today with a 1 day history of nausea vomiting and diarrhea x6 days.  Patient states that he had some chicken salad from [employer] on Thursday evening around 5 PM.  He started vomiting around 2 AM Friday morning.  He has been having diarrheal stools for the last several days.  He started with nausea vomiting today.  Presented to ER with his family to continue to nausea vomiting.  No other ill contacts.  Family did not eat the chicken salad.  No fevers.  He did state that he was feeling chilled while he was vomiting.  Labs showed a white count of 10.1, hemoglobin of 13.3 Lipase of 36  BUN of 68, creatinine 3.25.  Baseline creatinine approximately 3.0   CT abdomen pelvis demonstrated thickened appearance of the gastric wall.  There is air extending to the gastric mucosa in the gastric wall predominantly the gastric fundus and body of the stomach.  There is gas in the short gastric vein as well as a portal venous gas in the left lobe of the liver.  Concern for bowel ischemia involving the stomach.  General surgery has been consulted.  [facility] hospitalist consulted for admission.

## 2022-07-21 NOTE — Assessment & Plan Note (Signed)
Started today. Improved after IVF and antiemetics.

## 2022-07-21 NOTE — Assessment & Plan Note (Signed)
Stable

## 2022-07-21 NOTE — ED Provider Notes (Signed)
[facility] EMERGENCY DEPARTMENT Provider Note   CSN: [id number] Arrival date & time: 07/21/22  1931     History  Chief Complaint  Patient presents with   Nausea    LAWAYNE HARTIG is a 88 y.o. male.  Patient as above with significant medical history as below, including CABG, CAD, DM, HLD, HTN, AICD, CHF, CKD who presents to the ED with complaint of nausea/vom/ diarrhea. Reports diarrhea over last few days, started Thursday. Mostly liquid stool, no melena or brbpr. Intermittent over last few days but gradually worsening, no meds PTA. Having LLQ and LUQ abd pain last 24 hours, now having n/v for last hour leading up to presentation to the ED. No suspicious PO, no fevers, no recent travel, no cp or dib, no palpitations, no aicd activation. Denies recent abx, denies hx of cdiff, denies travel. Given zofran PTA by EMS and nausea greatly improved, no further emesis since then.      Past Medical History:  Diagnosis Date   Adenomatous colon polyp 2006   CAD (coronary artery disease)    Calcium oxalate renal stones    Cardiomyopathy    Cataract    Cataract    Cerebral embolism with cerebral infarction 04/02/2121   Chronic systolic heart failure (HCC)         CKD (chronic kidney disease) stage 4, GFR 15-29 ml/min ([place]) 11/08/2017   Claudication in peripheral vascular disease ([place]) 07/23/2020   Diabetes ([place])    Diabetic peripheral neuropathy ([place]) 01/31/2015   Dual implantable cardioverter-defibrillator in situ    01/16/2002 Dr. Lovena Le  RA lead  Guidant [id number] RV lead  Guidant [id number] Generator  Guidant Prism  09/02/2009 Generator change Medtronic [id number]  SN  [id number]      Erectile dysfunction    Erosive esophagitis    Essential hypertension 11/30/2015   Hemorrhoids    History of TIA (transient ischemic attack) 01/19/2019   HLD (hyperlipidemia)    HTN (hypertension)    Hyperlipidemia    Hypertensive heart disease without CHF 07/31/2011   ICD (implantable cardiac  defibrillator) in place    ICD dual chamber in situ    Ischemic cardiomyopathy    EF 40% June 2013    Left-sided weakness 0/38/8828   Metabolic syndrome    Moderate nonproliferative diabetic retinopathy of both eyes without macular edema associated with type 2 diabetes mellitus ([place]) 02/20/2020   Morbid obesity ([place])    OSA on CPAP 11/05/2014   Osteoarthritis    Posterior vitreous detachment of both eyes 02/20/2020   Presence of automatic (implantable) cardiac defibrillator 11/08/2017   S/P CABG (coronary artery bypass graft) 11/02/2000   Sleep apnea    Stroke-like symptoms 12/26/2018   Syncope 08/06/2009   Qualifier: Diagnosis of  By: Selena Batten CMA, [place]     Type 2 diabetes, uncontrolled, with neuropathy    Has retinopahty and neuropathy     Ventricular tachycardia (paroxysmal) ([place]) 07/14/2016    Past Surgical History:  Procedure Laterality Date   ABDOMINAL EXPLORATION SURGERY     BACK SURGERY     X'3   cardiac bypass     CARDIAC DEFIBRILLATOR PLACEMENT     CARPAL TUNNEL RELEASE     X2, bilateral   CATARACT EXTRACTION     COLONOSCOPY  06/20/2012   Procedure: COLONOSCOPY;  Surgeon: Sable Feil, MD;  Location: [facility];  Service: Endoscopy;  Laterality: N/A;   DOPPLER ECHOCARDIOGRAPHY  2003   EP IMPLANTABLE DEVICE N/A 07/14/2016  Procedure: ICD Generator Changeout;  Surgeon: Evans Lance, MD;  Location: [facility];  Service: Cardiovascular;  Laterality: N/A;   ESOPHAGOGASTRODUODENOSCOPY  06/20/2012   Procedure: ESOPHAGOGASTRODUODENOSCOPY (EGD);  Surgeon: Sable Feil, MD;  Location: [facility];  Service: Endoscopy;  Laterality: N/A;   EYE SURGERY     LAPAROTOMY     RETINOPATHY SURGERY Bilateral    rotator cuff surgery     left     The history is provided by the patient and the EMS personnel. No language interpreter was used.       Home Medications Prior to Admission medications   Medication Sig Start Date End Date Taking? Authorizing Provider   ACCU-CHEK GUIDE test strip by Other route as needed for other. 07/31/21   [provider]  allopurinol (ZYLOPRIM) 300 MG tablet Take 1/2 (one-half) tablet by mouth once daily 04/09/22   Dettinger, Fransisca Kaufmann, MD  benazepril (LOTENSIN) 40 MG tablet Take 1 tablet (40 mg total) by mouth daily. 09/30/21   Dettinger, Fransisca Kaufmann, MD  cloNIDine (CATAPRES) 0.1 MG tablet Take 1 tablet (0.1 mg total) by mouth daily. 01/22/22   Park Liter, MD  clopidogrel (PLAVIX) 75 MG tablet Take 1 tablet (75 mg total) by mouth daily. 04/09/22   Dettinger, Fransisca Kaufmann, MD  Cyanocobalamin (VITAMIN B 12 PO) Take 1,000 mcg by mouth daily.    [provider]  dapagliflozin propanediol (FARXIGA) 10 MG TABS tablet Take 1 tablet (10 mg total) by mouth daily before breakfast. 07/14/22   Dettinger, Fransisca Kaufmann, MD  Dulaglutide (TRULICITY) 3 EU/2.3NT SOPN Inject 3 mg as directed once a week. [name] patient assistance program 07/14/22   Dettinger, Fransisca Kaufmann, MD  DULoxetine (CYMBALTA) 30 MG capsule Take 1 capsule (30 mg total) by mouth daily. 04/09/22   Dettinger, Fransisca Kaufmann, MD  fluticasone (CUTIVATE) 0.05 % cream Apply 1 application. topically daily. 11/11/21   [provider]  furosemide (LASIX) 40 MG tablet Take 1 tablet (40 mg total) by mouth 2 (two) times daily. 04/09/22   Dettinger, Fransisca Kaufmann, MD  gabapentin (NEURONTIN) 300 MG capsule Take 1 capsule (300 mg total) by mouth 2 (two) times daily. 04/09/22   Dettinger, Fransisca Kaufmann, MD  Insulin Glargine [facility] KWIKPEN) 100 UNIT/ML Inject 25 Units into the skin daily. 07/14/22   Dettinger, Fransisca Kaufmann, MD  insulin regular (HUMULIN R) 100 units/mL injection Inject 0.3 mLs (30 Units total) into the skin 2 (two) times daily before a meal. 07/14/22   Dettinger, Fransisca Kaufmann, MD  Insulin Syringe-Needle U-100 (RELION INSULIN SYRINGE 1ML/31G) 31G X 5/16" 1 ML MISC USE TO INJECT INSULIN TWICE DAILY AS DIRECTED. DX: E11.65 02/05/22   Dettinger, Fransisca Kaufmann, MD  metoprolol succinate  (TOPROL-XL) 50 MG 24 hr tablet TAKE 1 TABLET BY MOUTH ONCE DAILY WITH OR IMMEDIATELY FOLLOWING A MEAL 04/09/22   Dettinger, Fransisca Kaufmann, MD  Multiple Vitamin (MULTIVITAMIN ADULT PO) Take 1 tablet by mouth daily.    [provider]  nitroGLYCERIN (NITROSTAT) 0.4 MG SL tablet Place 1 tablet (0.4 mg total) under the tongue every 5 (five) minutes as needed for chest pain. 04/09/22   Dettinger, Fransisca Kaufmann, MD  Omega-3 Fatty Acids (FISH OIL) 1000 MG CPDR Take 1 tablet by mouth daily.    [provider]  polyethylene glycol powder (GLYCOLAX/MIRALAX) powder Take 17 g by mouth 2 (two) times daily as needed. Patient taking differently: Take 17 g by mouth 2 (two) times daily as needed for mild  constipation or moderate constipation. 12/22/17   Eustaquio Maize, MD  rosuvastatin (CRESTOR) 5 MG tablet Take 1 tablet (5 mg total) by mouth daily. 10/02/21   Dettinger, Fransisca Kaufmann, MD  vitamin C (ASCORBIC ACID) 500 MG tablet Take 500 mg by mouth daily.    [provider]      Allergies    Patient has no known allergies.    Review of Systems   Review of Systems  Constitutional:  Negative for chills and fever.  HENT:  Negative for facial swelling and trouble swallowing.   Eyes:  Negative for photophobia and visual disturbance.  Respiratory:  Negative for cough and shortness of breath.   Cardiovascular:  Negative for chest pain and palpitations.  Gastrointestinal:  Positive for abdominal pain, diarrhea, nausea and vomiting.  Endocrine: Negative for polydipsia and polyuria.  Genitourinary:  Negative for difficulty urinating and hematuria.  Musculoskeletal:  Negative for gait problem and joint swelling.  Skin:  Negative for pallor and rash.  Neurological:  Negative for syncope and headaches.  Psychiatric/Behavioral:  Negative for agitation and confusion.     Physical Exam Updated Vital Signs BP 110/66   Pulse 60   Resp 17   SpO2 96%  Physical Exam Vitals and nursing note reviewed.   Constitutional:      General: He is not in acute distress.    Appearance: Normal appearance. He is well-developed. He is not ill-appearing, toxic-appearing or diaphoretic.  HENT:     Head: Normocephalic and atraumatic.     Right Ear: External ear normal.     Left Ear: External ear normal.     Mouth/Throat:     Mouth: Mucous membranes are moist.  Eyes:     General: No scleral icterus. Cardiovascular:     Rate and Rhythm: Normal rate and regular rhythm.     Pulses: Normal pulses.     Heart sounds: Normal heart sounds.  Pulmonary:     Effort: Pulmonary effort is normal. No respiratory distress.     Breath sounds: Normal breath sounds.  Chest:    Abdominal:     General: Abdomen is flat.     Palpations: Abdomen is soft.     Tenderness: There is abdominal tenderness in the left upper quadrant and left lower quadrant. There is no guarding or rebound. Negative signs include McBurney's sign.       Comments: Not peritoneal  Musculoskeletal:        General: Normal range of motion.     Cervical back: Normal range of motion.     Right lower leg: No edema.     Left lower leg: No edema.  Skin:    General: Skin is warm and dry.     Capillary Refill: Capillary refill takes less than 2 seconds.  Neurological:     Mental Status: He is alert and oriented to person, place, and time.     GCS: GCS eye subscore is 4. GCS verbal subscore is 5. GCS motor subscore is 6.  Psychiatric:        Mood and Affect: Mood normal.        Behavior: Behavior normal.     ED Results / Procedures / Treatments   Labs (all labs ordered are listed, but only abnormal results are displayed) Labs Reviewed  CBC WITH DIFFERENTIAL/PLATELET - Abnormal; Notable for the following components:      Result Value   Neutro Abs 7.8 (*)    All other components within normal limits  COMPREHENSIVE METABOLIC PANEL - Abnormal; Notable for the following components:   Glucose, Bld 171 (*)    BUN 68 (*)    Creatinine, Ser 3.25  (*)    GFR, Estimated 18 (*)    All other components within normal limits  RESP PANEL BY RT-PCR (FLU A&B, COVID) [id number]  C DIFFICILE QUICK SCREEN W PCR REFLEX    LIPASE, BLOOD  URINALYSIS, ROUTINE W REFLEX MICROSCOPIC  LACTIC ACID, PLASMA    EKG EKG Interpretation  Date/Time:  Tuesday July 21 2022 20:02:58 EST Ventricular Rate:  60 PR Interval:  61 QRS Duration: 183 QT Interval:  541 QTC Calculation: 541 R Axis:   -21 Text Interpretation: Sinus or ectopic atrial rhythm Short PR interval Left bundle branch block similar to prior no stemi Confirmed by Wynona Dove ([id number]) on 07/21/2022 8:34:23 PM  Radiology CT ABDOMEN PELVIS WO CONTRAST  Result Date: 07/21/2022 CLINICAL DATA:  Abdominal pain. EXAM: CT ABDOMEN AND PELVIS WITHOUT CONTRAST TECHNIQUE: Multidetector CT imaging of the abdomen and pelvis was performed following the standard protocol without IV contrast. RADIATION DOSE REDUCTION: This exam was performed according to the departmental dose-optimization program which includes automated exposure control, adjustment of the mA and/or kV according to patient size and/or use of iterative reconstruction technique. COMPARISON:  CT of the chest abdomen pelvis dated 12/25/2018. FINDINGS: Evaluation of this exam is limited in the absence of intravenous contrast. Lower chest: Minimal bibasilar subpleural atelectasis. The visualized lung bases are otherwise clear. There is advanced 3 vessel coronary vascular calcification and postsurgical changes of CABG. A cardiac pacemaker is noted. No intra-abdominal free air or free fluid. Hepatobiliary: The liver is unremarkable. No biliary dilatation. Multiple small stones within the gallbladder. No pericholecystic fluid or evidence of acute cholecystitis by CT. Pancreas: Unremarkable. No pancreatic ductal dilatation or surrounding inflammatory changes. Spleen: Normal in size without focal abnormality. Adrenals/Urinary Tract: The adrenal glands  unremarkable. Mild bilateral renal parenchyma atrophy. There is no hydronephrosis or nephrolithiasis on either side. The visualized ureters and urinary bladder appear unremarkable. Stomach/Bowel: There is thickened appearance of the gastric wall. There is air extending to the gastric mucosa and gastric wall predominantly in the gastric fundus and body of the stomach. There is gas within the short gastric veins as well as portal venous gas in the left lobe of the liver. Findings suspicious for bowel ischemia involving the stomach. Clinical correlation and surgical consult is advised. There is no bowel obstruction. The appendix is normal. Vascular/Lymphatic: Advanced aortoiliac atherosclerotic disease. The IVC is unremarkable. No portal venous gas. There is no adenopathy. The Reproductive: Prostate and seminal vesicles are grossly unremarkable. No acute mass. Other: None Musculoskeletal: Degenerative changes of the spine. No acute osseous pathology. IMPRESSION: 1. Findings suspicious for bowel ischemia involving the stomach. Clinical correlation and surgical consult is advised. 2. Cholelithiasis. 3. No hydronephrosis or nephrolithiasis. 4.  Aortic Atherosclerosis (ICD10-I70.0). These results were called by telephone at the time of interpretation on 07/21/2022 at 9:58 pm to provider Wynona Dove , who verbally acknowledged these results. Electronically Signed   By: Anner Crete M.D.   On: 07/21/2022 22:16    Procedures Procedures    Medications Ordered in ED Medications  sodium chloride 0.9 % bolus 1,000 mL (0 mLs Intravenous Stopped 07/21/22 2123)  famotidine (PEPCID) IVPB 20 mg premix (0 mg Intravenous Stopped 07/21/22 2122)    ED Course/ Medical Decision Making/ A&P Clinical Course as of 07/21/22 2254  Tue Jul 21, 2022  2225 "IMPRESSION: 1. Findings  suspicious for bowel ischemia involving the stomach. Clinical correlation and surgical consult is advised. 2. Cholelithiasis. 3. No hydronephrosis  or nephrolithiasis. 4.  Aortic Atherosclerosis (ICD10-I70.0)." [SG]  2242 Spoke with gen surg on call Dr Thermon Leyland, reviewed imaging, recommends get LA and trend in the AM. Send stool culture, will come see. Admit to hospitalist o/n [SG]    Clinical Course User Index [SG] Jeanell Sparrow, DO                           Medical Decision Making Amount and/or Complexity of Data Reviewed Labs: ordered. Radiology: ordered.  Risk Prescription drug management.   This patient presents to the ED with chief complaint(s) of abd pain n/v/d with pertinent past medical history of as above which further complicates the presenting complaint. The complaint involves an extensive differential diagnosis and also carries with it a high risk of complications and morbidity.     Differential diagnosis includes but is not exclusive to acute appendicitis, renal colic, testicular torsion, urinary tract infection, prostatitis,  diverticulitis, small bowel obstruction, colitis, abdominal aortic aneurysm, gastroenteritis, constipation etc.  Differential diagnosis includes but is not exclusive to acute cholecystitis, intrathoracic causes for epigastric abdominal pain, gastritis, duodenitis, pancreatitis, small bowel or large bowel obstruction, abdominal aortic aneurysm, hernia, gastritis, etc.  . Serious etiologies were considered.   The initial plan is to screening labs/imaging   Additional history obtained: Additional history obtained from  family at bedside  Records reviewed [place] and home medications, prior labs/imaging   Independent labs interpretation:  The following labs were independently interpreted:  Cr 3.25, mildly elev from prior, he has CKD, BUN mild elev but similar to prior, K wnl CBC w/o leukocytosis but elev neutrophils RVP neg Lipase wnl  Independent visualization of imaging: - I independently visualized the following imaging with scope of interpretation limited to  determining acute life threatening conditions related to emergency care: CTAP, which revealed pneumatosis, ?bowel ischemia  Cardiac monitoring was reviewed and interpreted by myself which shows NSR  Treatment and Reassessment: IVF Pepcid >> improved  Consultation: - Consulted or discussed management/test interpretation w/ external professional: Gen surg  Consideration for admission or further workup: Admission was considered    Pt with n/v/d, diarrhea last few days, n/v just PTA today. Nausea improved after zofran. He is HDS. Abd is soft/not peritoneal. D/w gen surg regarding imaging, recommend trend LA and obs overnight, Dr Thermon Leyland.  Spoke with Dr Bridgett Larsson who accepts pt for admission   Social Determinants of health: Social History   Tobacco Use   Smoking status: Never   Smokeless tobacco: Never  Vaping Use   Vaping Use: Never used  Substance Use Topics   Alcohol use: No    Alcohol/week: 0.0 standard drinks of alcohol   Drug use: No            Final Clinical Impression(s) / ED Diagnoses Final diagnoses:  Nausea vomiting and diarrhea  Pneumatosis intestinalis    Rx / DC Orders ED Discharge Orders     None         Jeanell Sparrow, DO 07/21/22 2254

## 2022-07-21 NOTE — Consult Note (Signed)
Consulting Physician: Nickola Major Ameliana Brashear  Referring Provider: Dr. Pearline Cables - ER Provider  Chief Complaint: Abdominal pain, nausea, vomiting  Reason for Consult: Gastric ischemia with portal venous gas   Subjective   HPI: Manuel Kline is an 88 y.o. male who is here for abdominal pain, nausea and vomiting.    Mr. Worley had a chicken salad sandwich on Thursday, and woke up in the night with violent diarrhea.  He has had diarrhea since then.  Today developed severe nausea and was retching just prior to admission.  He has some mild pain in his left lower quadrant.   Past Medical History:  Diagnosis Date   Adenomatous colon polyp 2006   CAD (coronary artery disease)    Calcium oxalate renal stones    Cardiomyopathy    Cataract    Cataract    Cerebral embolism with cerebral infarction 3/71/6967   Chronic systolic heart failure (HCC)         CKD (chronic kidney disease) stage 4, GFR 15-29 ml/min ([place]) 11/08/2017   Claudication in peripheral vascular disease ([place]) 07/23/2020   Diabetes ([place])    Diabetic peripheral neuropathy ([place]) 01/31/2015   Dual implantable cardioverter-defibrillator in situ    01/16/2002 Dr. Lovena Le  RA lead  Guidant [id number] RV lead  Guidant [id number] Generator  Guidant Prism  09/02/2009 Generator change Medtronic [id number]  SN  [id number]      Erectile dysfunction    Erosive esophagitis    Essential hypertension 11/30/2015   Hemorrhoids    History of TIA (transient ischemic attack) 01/19/2019   HLD (hyperlipidemia)    HTN (hypertension)    Hyperlipidemia    Hypertensive heart disease without CHF 07/31/2011   ICD (implantable cardiac defibrillator) in place    ICD dual chamber in situ    Ischemic cardiomyopathy    EF 40% June 2013    Left-sided weakness 2/42/3536   Metabolic syndrome    Moderate nonproliferative diabetic retinopathy of both eyes without macular edema associated with type 2 diabetes mellitus ([place]) 02/20/2020   Morbid obesity ([place])    OSA on CPAP  11/05/2014   Osteoarthritis    Posterior vitreous detachment of both eyes 02/20/2020   Presence of automatic (implantable) cardiac defibrillator 11/08/2017   S/P CABG (coronary artery bypass graft) 11/02/2000   Sleep apnea    Stroke-like symptoms 12/26/2018   Syncope 08/06/2009   Qualifier: Diagnosis of  By: Selena Batten CMA, [place]     Type 2 diabetes, uncontrolled, with neuropathy    Has retinopahty and neuropathy     Ventricular tachycardia (paroxysmal) ([place]) 07/14/2016    Past Surgical History:  Procedure Laterality Date   ABDOMINAL EXPLORATION SURGERY     BACK SURGERY     X'3   cardiac bypass     CARDIAC DEFIBRILLATOR PLACEMENT     CARPAL TUNNEL RELEASE     X2, bilateral   CATARACT EXTRACTION     COLONOSCOPY  06/20/2012   Procedure: COLONOSCOPY;  Surgeon: Sable Feil, MD;  Location: [facility];  Service: Endoscopy;  Laterality: N/A;   DOPPLER ECHOCARDIOGRAPHY  2003   EP IMPLANTABLE DEVICE N/A 07/14/2016   Procedure: ICD Generator Changeout;  Surgeon: Evans Lance, MD;  Location: [facility];  Service: Cardiovascular;  Laterality: N/A;   ESOPHAGOGASTRODUODENOSCOPY  06/20/2012   Procedure: ESOPHAGOGASTRODUODENOSCOPY (EGD);  Surgeon: Sable Feil, MD;  Location: [facility];  Service: Endoscopy;  Laterality: N/A;   EYE SURGERY     LAPAROTOMY  RETINOPATHY SURGERY Bilateral    rotator cuff surgery     left    Family History  Problem Relation Age of Onset   Diabetes Brother    Heart disease Father    Heart disease Mother    Diabetes Mother    Diabetes Brother    Diabetes Brother    Cancer Brother        baldder   Heart disease Brother    Diabetes Sister    Diabetes Sister    Kidney disease Sister        dialysis   Diabetes Sister    Diabetes Sister    Diabetes Sister    Throat cancer Paternal Uncle     Social:  reports that he has never smoked. He has never used smokeless tobacco. He reports that he does not drink alcohol and does not use  drugs.  Allergies: No Known Allergies  Medications: Current Outpatient Medications  Medication Instructions   ACCU-CHEK GUIDE test strip Other, As needed   allopurinol (ZYLOPRIM) 300 MG tablet Take 1/2 (one-half) tablet by mouth once daily   ascorbic acid (VITAMIN C) 500 mg, Oral, Daily   Basaglar KwikPen 25 Units, Subcutaneous, Daily   benazepril (LOTENSIN) 40 mg, Oral, Daily   cloNIDine (CATAPRES) 0.1 mg, Oral, Daily   clopidogrel (PLAVIX) 75 mg, Oral, Daily   Cyanocobalamin (VITAMIN B 12 PO) 1,000 mcg, Oral, Daily   dapagliflozin propanediol (FARXIGA) 10 mg, Oral, Daily before breakfast   DULoxetine (CYMBALTA) 30 mg, Oral, Daily   fluticasone (CUTIVATE) 4.40 % cream 1 application , Topical, Daily   furosemide (LASIX) 40 mg, Oral, 2 times daily   gabapentin (NEURONTIN) 300 mg, Oral, 2 times daily   insulin regular (HUMULIN R) 30 Units, Subcutaneous, 2 times daily before meals   Insulin Syringe-Needle U-100 (RELION INSULIN SYRINGE 1ML/31G) 31G X 5/16" 1 ML MISC USE TO INJECT INSULIN TWICE DAILY AS DIRECTED. DX: E11.65   metoprolol succinate (TOPROL-XL) 50 MG 24 hr tablet TAKE 1 TABLET BY MOUTH ONCE DAILY WITH OR IMMEDIATELY FOLLOWING A MEAL   Multiple Vitamin (MULTIVITAMIN ADULT PO) 1 tablet, Oral, Daily   nitroGLYCERIN (NITROSTAT) 0.4 mg, Sublingual, Every 5 min PRN   Omega-3 Fatty Acids (FISH OIL) 1000 MG CPDR 1 tablet, Oral, Daily   polyethylene glycol powder (GLYCOLAX/MIRALAX) 17 g, Oral, 2 times daily PRN   rosuvastatin (CRESTOR) 5 mg, Oral, Daily   Trulicity 3 mg, Injection, Weekly, [name] patient assistance program    ROS - all of the below systems have been reviewed with the patient and positives are indicated with bold text General: chills, fever or night sweats Eyes: blurry vision or double vision ENT: epistaxis or sore throat Allergy/Immunology: itchy/watery eyes or nasal congestion Hematologic/Lymphatic: bleeding problems, blood clots or swollen lymph  nodes Endocrine: temperature intolerance or unexpected weight changes Breast: new or changing breast lumps or nipple discharge Resp: cough, shortness of breath, or wheezing CV: chest pain or dyspnea on exertion GI: as per HPI GU: dysuria, trouble voiding, or hematuria MSK: joint pain or joint stiffness Neuro: TIA or stroke symptoms Derm: pruritus and skin lesion changes Psych: anxiety and depression  Objective   PE Blood pressure 110/66, pulse 60, resp. rate 17, SpO2 96 %. Constitutional: NAD; conversant; no deformities Eyes: Moist conjunctiva; no lid lag; anicteric; PERRL Neck: Trachea midline; no thyromegaly Lungs: Normal respiratory effort; no tactile fremitus CV: RRR; no palpable thrills; no pitting edema GI: Abd soft, tender to palpation in the left lower quadrant;  no palpable hepatosplenomegaly MSK: Normal range of motion of extremities; no clubbing/cyanosis Psychiatric: Appropriate affect; alert and oriented x3 Lymphatic: No palpable cervical or axillary lymphadenopathy  Results for orders placed or performed during the hospital encounter of 07/21/22 (from the past 24 hour(s))  Resp Panel by RT-PCR (Flu A&B, Covid) Anterior Nasal Swab     Status: None   Collection Time: 07/21/22  7:43 PM   Specimen: Anterior Nasal Swab  Result Value Ref Range   SARS Coronavirus 2 by RT PCR NEGATIVE NEGATIVE   Influenza A by PCR NEGATIVE NEGATIVE   Influenza B by PCR NEGATIVE NEGATIVE  CBC with Differential     Status: Abnormal   Collection Time: 07/21/22  7:47 PM  Result Value Ref Range   WBC 10.1 4.0 - 10.5 K/uL   RBC 4.34 4.22 - 5.81 MIL/uL   Hemoglobin 13.3 13.0 - 17.0 g/dL   HCT 39.7 39.0 - 52.0 %   MCV 91.5 80.0 - 100.0 fL   MCH 30.6 26.0 - 34.0 pg   MCHC 33.5 30.0 - 36.0 g/dL   RDW 14.8 11.5 - 15.5 %   Platelets 172 150 - 400 K/uL   nRBC 0.0 0.0 - 0.2 %   Neutrophils Relative % 79 %   Neutro Abs 7.8 (H) 1.7 - 7.7 K/uL   Lymphocytes Relative 13 %   Lymphs Abs 1.3 0.7 -  4.0 K/uL   Monocytes Relative 6 %   Monocytes Absolute 0.6 0.1 - 1.0 K/uL   Eosinophils Relative 2 %   Eosinophils Absolute 0.2 0.0 - 0.5 K/uL   Basophils Relative 0 %   Basophils Absolute 0.0 0.0 - 0.1 K/uL   Immature Granulocytes 0 %   Abs Immature Granulocytes 0.04 0.00 - 0.07 K/uL  Comprehensive metabolic panel     Status: Abnormal   Collection Time: 07/21/22  7:47 PM  Result Value Ref Range   Sodium 137 135 - 145 mmol/L   Potassium 4.2 3.5 - 5.1 mmol/L   Chloride 102 98 - 111 mmol/L   CO2 22 22 - 32 mmol/L   Glucose, Bld 171 (H) 70 - 99 mg/dL   BUN 68 (H) 8 - 23 mg/dL   Creatinine, Ser 3.25 (H) 0.61 - 1.24 mg/dL   Calcium 9.2 8.9 - 10.3 mg/dL   Total Protein 6.8 6.5 - 8.1 g/dL   Albumin 3.9 3.5 - 5.0 g/dL   AST 27 15 - 41 U/L   ALT 18 0 - 44 U/L   Alkaline Phosphatase 82 38 - 126 U/L   Total Bilirubin 0.4 0.3 - 1.2 mg/dL   GFR, Estimated 18 (L) >60 mL/min   Anion gap 13 5 - 15  Lipase, blood     Status: None   Collection Time: 07/21/22  7:47 PM  Result Value Ref Range   Lipase 36 11 - 51 U/L     Imaging Orders         CT ABDOMEN PELVIS WO CONTRAST     1. Findings suspicious for bowel ischemia involving the stomach. Clinical correlation and surgical consult is advised. 2. Cholelithiasis. 3. No hydronephrosis or nephrolithiasis. 4.  Aortic Atherosclerosis (ICD10-I70.0).  Assessment and Plan   Manuel Kline is an 88 y.o. male with abdominal pain, nausea, vomiting, and diarrhea.  His CT scan demonstrates a small area of pneumatosis of the stomach with a small amount of associated portal venous gas up into the liver.  On exam the patient has a benign abdomen, with  mild pain in the left lower quadrant away from the epigastric area.  He has a normal white count.  He has no evidence of hiatal hernia or gastric volvulus on imaging.  I wonder if this finding is related to the vomiting he was doing just prior to going to the CT scan.  I recommend checking a lactic acid level.   He should be rehydrated.  His creatinine is slightly elevated from his baseline around 2.  Testing for fecal pathogens that explain his diarrhea would be helpful.  He is feeling better in the emergency room, and if he continues to improve overnight, he may be able to go home tomorrow.    Felicie Morn, MD  [facility] Use [email or web address] to contact on call provider  New Patient Billing: [phone] - High MDM

## 2022-07-21 NOTE — ED Notes (Signed)
Out to CT 

## 2022-07-21 NOTE — ED Triage Notes (Addendum)
BIB [facility]. N/V/D- N/V x3 times in the past ~1hr - Diarrhea since Thursday. No change in eating drinking habits. No one else sick at home. Afebrile. Some LUQ tenderness. No CP no SOB no diaphoresis.  EMS '4mg'$  zofran given 41m NS

## 2022-07-21 NOTE — Assessment & Plan Note (Signed)
Observation med/surg bed. General surgery recommended pt to be admitted. They will continue to follow and make recommendations tomorrow. Clear liquid diet. IVF overnight.

## 2022-07-22 ENCOUNTER — Ambulatory Visit: Payer: Medicare Other | Admitting: Family Medicine

## 2022-07-22 ENCOUNTER — Encounter (HOSPITAL_COMMUNITY): Payer: Self-pay | Admitting: Internal Medicine

## 2022-07-22 ENCOUNTER — Ambulatory Visit (INDEPENDENT_AMBULATORY_CARE_PROVIDER_SITE_OTHER): Payer: Medicare Other

## 2022-07-22 DIAGNOSIS — K6389 Other specified diseases of intestine: Secondary | ICD-10-CM | POA: Diagnosis not present

## 2022-07-22 DIAGNOSIS — R112 Nausea with vomiting, unspecified: Secondary | ICD-10-CM | POA: Diagnosis not present

## 2022-07-22 DIAGNOSIS — I255 Ischemic cardiomyopathy: Secondary | ICD-10-CM

## 2022-07-22 DIAGNOSIS — G4733 Obstructive sleep apnea (adult) (pediatric): Secondary | ICD-10-CM | POA: Diagnosis not present

## 2022-07-22 DIAGNOSIS — R197 Diarrhea, unspecified: Secondary | ICD-10-CM | POA: Diagnosis not present

## 2022-07-22 DIAGNOSIS — R1084 Generalized abdominal pain: Secondary | ICD-10-CM | POA: Diagnosis not present

## 2022-07-22 LAB — COMPREHENSIVE METABOLIC PANEL
ALT: 18 U/L (ref 0–44)
AST: 26 U/L (ref 15–41)
Albumin: 3.5 g/dL (ref 3.5–5.0)
Alkaline Phosphatase: 76 U/L (ref 38–126)
Anion gap: 18 — ABNORMAL HIGH (ref 5–15)
BUN: 65 mg/dL — ABNORMAL HIGH (ref 8–23)
CO2: 20 mmol/L — ABNORMAL LOW (ref 22–32)
Calcium: 9.2 mg/dL (ref 8.9–10.3)
Chloride: 102 mmol/L (ref 98–111)
Creatinine, Ser: 2.94 mg/dL — ABNORMAL HIGH (ref 0.61–1.24)
GFR, Estimated: 20 mL/min — ABNORMAL LOW (ref >60)
Glucose, Bld: 93 mg/dL (ref 70–99)
Potassium: 4.5 mmol/L (ref 3.5–5.1)
Sodium: 140 mmol/L (ref 135–145)
Total Bilirubin: 0.6 mg/dL (ref 0.3–1.2)
Total Protein: 6.5 g/dL (ref 6.5–8.1)

## 2022-07-22 LAB — CBC WITH DIFFERENTIAL/PLATELET
Abs Immature Granulocytes: 0.03 10*3/uL (ref 0.00–0.07)
Basophils Absolute: 0 10*3/uL (ref 0.0–0.1)
Basophils Relative: 1 %
Eosinophils Absolute: 0.2 10*3/uL (ref 0.0–0.5)
Eosinophils Relative: 2 %
HCT: 39 % (ref 39.0–52.0)
Hemoglobin: 12.7 g/dL — ABNORMAL LOW (ref 13.0–17.0)
Immature Granulocytes: 0 %
Lymphocytes Relative: 20 %
Lymphs Abs: 1.7 10*3/uL (ref 0.7–4.0)
MCH: 30.3 pg (ref 26.0–34.0)
MCHC: 32.6 g/dL (ref 30.0–36.0)
MCV: 93.1 fL (ref 80.0–100.0)
Monocytes Absolute: 0.5 10*3/uL (ref 0.1–1.0)
Monocytes Relative: 7 %
Neutro Abs: 5.9 10*3/uL (ref 1.7–7.7)
Neutrophils Relative %: 70 %
Platelets: 181 10*3/uL (ref 150–400)
RBC: 4.19 MIL/uL — ABNORMAL LOW (ref 4.22–5.81)
RDW: 14.9 % (ref 11.5–15.5)
WBC: 8.3 10*3/uL (ref 4.0–10.5)
nRBC: 0 % (ref 0.0–0.2)

## 2022-07-22 LAB — URINALYSIS, ROUTINE W REFLEX MICROSCOPIC
Bacteria, UA: NONE SEEN
Bilirubin Urine: NEGATIVE
Glucose, UA: 150 mg/dL — AB
Hgb urine dipstick: NEGATIVE
Ketones, ur: NEGATIVE mg/dL
Leukocytes,Ua: NEGATIVE
Nitrite: NEGATIVE
Protein, ur: NEGATIVE mg/dL
Specific Gravity, Urine: 1.011 (ref 1.005–1.030)
pH: 5 (ref 5.0–8.0)

## 2022-07-22 LAB — CBG MONITORING, ED
Glucose-Capillary: 96 mg/dL (ref 70–99)
Glucose-Capillary: 95 mg/dL (ref 70–99)
Glucose-Capillary: 120 mg/dL — ABNORMAL HIGH (ref 70–99)
Glucose-Capillary: 117 mg/dL — ABNORMAL HIGH (ref 70–99)

## 2022-07-22 LAB — HEMOGLOBIN A1C
Hgb A1c MFr Bld: 7 % — ABNORMAL HIGH (ref 4.8–5.6)
Mean Plasma Glucose: 154.2 mg/dL

## 2022-07-22 LAB — MAGNESIUM: Magnesium: 2 mg/dL (ref 1.7–2.4)

## 2022-07-22 MED ORDER — HEPARIN SODIUM (PORCINE) 5000 UNIT/ML IJ SOLN
5000.0000 [IU] | Freq: Three times a day (TID) | INTRAMUSCULAR | Status: DC
  Administered 2022-07-22 (×3): 5000 [IU] via SUBCUTANEOUS
  Filled 2022-07-22 (×4): qty 1

## 2022-07-22 MED ORDER — ONDANSETRON HCL 4 MG PO TABS: 4.0000 mg | ORAL_TABLET | Freq: Four times a day (QID) | ORAL | Status: DC | PRN

## 2022-07-22 MED ORDER — ACETAMINOPHEN 325 MG PO TABS: 650.0000 mg | ORAL_TABLET | Freq: Four times a day (QID) | ORAL | Status: DC | PRN

## 2022-07-22 MED ORDER — MELATONIN 5 MG PO TABS: 10.0000 mg | ORAL_TABLET | Freq: Every evening | ORAL | Status: DC | PRN

## 2022-07-22 MED ORDER — INSULIN ASPART 100 UNIT/ML IJ SOLN: 0.0000 [IU] | Freq: Three times a day (TID) | INTRAMUSCULAR | Status: DC

## 2022-07-22 MED ORDER — ACETAMINOPHEN 650 MG RE SUPP: 650.0000 mg | Freq: Four times a day (QID) | RECTAL | Status: DC | PRN

## 2022-07-22 MED ORDER — ONDANSETRON HCL 4 MG/2ML IJ SOLN: 4.0000 mg | Freq: Four times a day (QID) | INTRAMUSCULAR | Status: DC | PRN

## 2022-07-22 MED ORDER — INSULIN ASPART 100 UNIT/ML IJ SOLN: 0.0000 [IU] | Freq: Every day | INTRAMUSCULAR | Status: DC

## 2022-07-22 NOTE — ED Notes (Signed)
Lunch provided.

## 2022-07-22 NOTE — ED Notes (Signed)
Pt/family report rounding MD said that pt may be discharged at 2 or 3 today. Dr. Darrick Meigs contacted about same and informed that pt has a bed upstairs. Dr. Darrick Meigs says don't send pt upstairs and he will work on DC. Will contact patient placement.

## 2022-07-22 NOTE — Progress Notes (Addendum)
Subjective: CC: Diarrhea starting Thursday. N/v and retching last night.   Tolerating cld without abdominal pain, n/v this am. Not requiring any prn meds. Passing flatus. No BM today.   Soft bp this am (91/49 and 16/10) but no systolic hypotension and BP improved currently with last 118/55. No tachycardia. Afebrile. WBC and lactic wnl.   Last dose plavix Thursday AM  Objective: Vital signs in last 24 hours: Temp:  [97.9 F (36.6 C)-98.3 F (36.8 C)] 97.9 F (36.6 C) (11/22 0917) Pulse Rate:  [59-77] 72 (11/22 1000) Resp:  [14-26] 20 (11/22 1000) BP: (91-145)/(35-97) 118/55 (11/22 1000) SpO2:  [90 %-99 %] 97 % (11/22 1000) Last BM Date : 07/21/22  Intake/Output from previous day: No intake/output data recorded. Intake/Output this shift: No intake/output data recorded.  PE: Gen:  Alert, NAD, pleasant Abd: Soft, ND, NT, no rigidity or guarding, +BS, prior midline scar well healed.   Lab Results:  Recent Labs    07/21/22 1947 07/22/22 0312  WBC 10.1 8.3  HGB 13.3 12.7*  HCT 39.7 39.0  PLT 172 181   BMET Recent Labs    07/21/22 1947 07/22/22 0312  NA 137 140  K 4.2 4.5  CL 102 102  CO2 22 20*  GLUCOSE 171* 93  BUN 68* 65*  CREATININE 3.25* 2.94*  CALCIUM 9.2 9.2   PT/INR No results for input(s): "LABPROT", "INR" in the last 72 hours. CMP     Component Value Date/Time   NA 140 07/22/2022 0312   NA 137 07/13/2022 1429   K 4.5 07/22/2022 0312   CL 102 07/22/2022 0312   CO2 20 (L) 07/22/2022 0312   GLUCOSE 93 07/22/2022 0312   BUN 65 (H) 07/22/2022 0312   BUN 46 (H) 07/13/2022 1429   CREATININE 2.94 (H) 07/22/2022 0312   CREATININE 2.04 (H) 07/03/2016 1028   CALCIUM 9.2 07/22/2022 0312   PROT 6.5 07/22/2022 0312   PROT 7.3 07/13/2022 1429   ALBUMIN 3.5 07/22/2022 0312   ALBUMIN 4.6 07/13/2022 1429   AST 26 07/22/2022 0312   ALT 18 07/22/2022 0312   ALKPHOS 76 07/22/2022 0312   BILITOT 0.6 07/22/2022 0312   BILITOT 0.5 07/13/2022 1429    GFRNONAA 20 (L) 07/22/2022 0312   GFRAA 43 (L) 05/22/2020 1142   Lipase     Component Value Date/Time   LIPASE 36 07/21/2022 1947    Studies/Results: CT ABDOMEN PELVIS WO CONTRAST  Result Date: 07/21/2022 CLINICAL DATA:  Abdominal pain. EXAM: CT ABDOMEN AND PELVIS WITHOUT CONTRAST TECHNIQUE: Multidetector CT imaging of the abdomen and pelvis was performed following the standard protocol without IV contrast. RADIATION DOSE REDUCTION: This exam was performed according to the departmental dose-optimization program which includes automated exposure control, adjustment of the mA and/or kV according to patient size and/or use of iterative reconstruction technique. COMPARISON:  CT of the chest abdomen pelvis dated 12/25/2018. FINDINGS: Evaluation of this exam is limited in the absence of intravenous contrast. Lower chest: Minimal bibasilar subpleural atelectasis. The visualized lung bases are otherwise clear. There is advanced 3 vessel coronary vascular calcification and postsurgical changes of CABG. A cardiac pacemaker is noted. No intra-abdominal free air or free fluid. Hepatobiliary: The liver is unremarkable. No biliary dilatation. Multiple small stones within the gallbladder. No pericholecystic fluid or evidence of acute cholecystitis by CT. Pancreas: Unremarkable. No pancreatic ductal dilatation or surrounding inflammatory changes. Spleen: Normal in size without focal abnormality. Adrenals/Urinary Tract: The adrenal glands unremarkable. Mild bilateral  renal parenchyma atrophy. There is no hydronephrosis or nephrolithiasis on either side. The visualized ureters and urinary bladder appear unremarkable. Stomach/Bowel: There is thickened appearance of the gastric wall. There is air extending to the gastric mucosa and gastric wall predominantly in the gastric fundus and body of the stomach. There is gas within the short gastric veins as well as portal venous gas in the left lobe of the liver. Findings  suspicious for bowel ischemia involving the stomach. Clinical correlation and surgical consult is advised. There is no bowel obstruction. The appendix is normal. Vascular/Lymphatic: Advanced aortoiliac atherosclerotic disease. The IVC is unremarkable. No portal venous gas. There is no adenopathy. The Reproductive: Prostate and seminal vesicles are grossly unremarkable. No acute mass. Other: None Musculoskeletal: Degenerative changes of the spine. No acute osseous pathology. IMPRESSION: 1. Findings suspicious for bowel ischemia involving the stomach. Clinical correlation and surgical consult is advised. 2. Cholelithiasis. 3. No hydronephrosis or nephrolithiasis. 4.  Aortic Atherosclerosis (ICD10-I70.0). These results were called by telephone at the time of interpretation on 07/21/2022 at 9:58 pm to provider Wynona Dove , who verbally acknowledged these results. Electronically Signed   By: Anner Crete M.D.   On: 07/21/2022 22:16    Anti-infectives: Anti-infectives (From admission, onward)    None        Assessment/Plan Manuel Kline is an 88 y.o. male with abdominal pain, nausea, vomiting, retching and diarrhea prior to presentation.  His CT scan demonstrates a small area of pneumatosis of the stomach with a small amount of associated portal venous gas up into the liver. On exam the patient has a benign abdomen and is completely NT. No peritonitis on exam. He had some soft pressors overnight but vitals have otherwise been stable and he is currently afebrile without tachycardia or hypotension. His wbc and lactic were wnl. Cr downtrending. I do not think he needs any emergency surgery at this time. No abx indicated. He is tolerating cld. Will adv to [place] with ADAT order to soft diet. If he tolerates this well without any abdominal pain and vitals remain stable he may be able to be discharged later today. Continue to monitor serial abdominal exams closely as advance diet. Discussed with patient and family  who were at bedside.   FEN - FLD, ADAT, IVF per [facility] VTE - SCDs, Subq heparin ID - None    CKD4 HTN DM OSA on CPAP ICD  CAD on Plavix  ICM   LOS: 0 days    Jillyn Ledger , [facility] [place] [facility] 07/22/2022, 11:29 AM Please see Amion for pager number during day hours 7:00am-4:30pm

## 2022-07-22 NOTE — ED Notes (Signed)
Pt tolerated lunch without difficulty.

## 2022-07-22 NOTE — Discharge Summary (Signed)
Physician Discharge Summary   Patient: Manuel Kline MRN: [id number] DOB: [birth year]  Admit date:     07/21/2022  Discharge date: 07/22/22  Discharge Physician: Oswald Hillock   PCP: Dettinger, Fransisca Kaufmann, MD   Recommendations at discharge:   Follow-up PCP in 1 week Check lab work, BNP in 1 week  Discharge Diagnoses: Principal Problem:   Pneumatosis of intestines Active Problems:   Diarrhea   N&V (nausea and vomiting)   Type 2 diabetes mellitus with other specified complication (HCC)   Ischemic cardiomyopathy   Chronic systolic heart failure (HCC)   CAD (coronary artery disease)   Dual implantable cardioverter-defibrillator in situ   OSA on CPAP   Diabetic peripheral neuropathy (HCC)   Essential hypertension   CKD (chronic kidney disease) stage 4, GFR 15-29 ml/min (HCC)  Resolved Problems:   * No resolved hospital problems. *  Hospital Course: 88 year old male with history of chronic systolic heart failure/ischemic cardiomyopathy, EF 45 to 50%, diabetes mellitus type 2, hypertension, CKD stage IV, OSA, history of ICD placement, CAD, diabetic peripheral neuropathy came to ER with 1 day history of intractable nausea vomiting and diarrhea for past 6 days. Patient had chicken salad from [employer] on Thursday around 5 PM and next morning at 2 AM started vomiting also having diarrheal stools for past several days.   Lab work in the ED showed lipase 36, WBC 10.1, creatinine 3.25, baseline creatinine 2-2.5   CT abdomen/pelvis showed thickened appearance of gastric wall, air extending to gastric mucosa in the gastric wall predominantly in the gastric fundus and body of stomach.  Gas in the short gastric vein as well as portal venous gas in the left lobe of liver.  Concern for bowel ischemia involving the stomach.  General surgery was consulted   Assessment and Plan:   Gastric pneumatosis -CT abdomen showed small area of gastric pneumatosis, general surgery consulted -Likely from  intractable nausea vomiting -Recommend to treat conservatively; normal lactate -Abdominal pain has resolved -Tolerated diet well, general surgery recommended to discharge home   Nausea and vomiting -Improved after IV fluids and antiemetics -Patient has tolerated diet, general surgery recommends to discharge home   Diarrhea -Stool for GI pathogen panel ordered -No diarrhea since patient came to hospital   CKD stage IV -Baseline creatinine around 2.5 -Presented with creatinine of 3.25, improved to 2.94 with IV fluids -Vomiting and diarrhea has improved -Follow-up PCP in 1 week to check BMP as outpatient -Recommend to hold furosemide and Lotensin for 2 days and start taking from 07/25/2022   Hypertension -Lotensin and furosemide on hold for 2 days as above -Continue Catapres   S/p ICD -Stable   CAD -Stable   Chronic systolic heart failure -Appears euvolemic   Diabetes mellitus type 2 -Continue home regimen       Consultants: General surgery Procedures performed:  Disposition: Home Diet recommendation:  Discharge Diet Orders (From admission, onward)     Start     Ordered   07/22/22 0000  Diet - low sodium heart healthy        07/22/22 1603           Regular diet DISCHARGE MEDICATION: Allergies as of 07/22/2022   No Known Allergies      Medication List     TAKE these medications    Accu-Chek Guide test strip Generic drug: glucose blood by Other route as needed for other.   allopurinol 300 MG tablet Commonly known as: ZYLOPRIM Take 1/2 (  one-half) tablet by mouth once daily   ascorbic acid 500 MG tablet Commonly known as: VITAMIN C Take 500 mg by mouth daily.   Basaglar KwikPen 100 UNIT/ML Inject 25 Units into the skin daily.   benazepril 40 MG tablet Commonly known as: LOTENSIN Take 1 tablet (40 mg total) by mouth daily.   cloNIDine 0.1 MG tablet Commonly known as: CATAPRES Take 1 tablet (0.1 mg total) by mouth daily.   clopidogrel  75 MG tablet Commonly known as: PLAVIX Take 1 tablet (75 mg total) by mouth daily.   dapagliflozin propanediol 10 MG Tabs tablet Commonly known as: Farxiga Take 1 tablet (10 mg total) by mouth daily before breakfast.   DULoxetine 30 MG capsule Commonly known as: CYMBALTA Take 1 capsule (30 mg total) by mouth daily.   Fish Oil 1000 MG Cpdr Take 1 tablet by mouth daily.   fluticasone 0.05 % cream Commonly known as: CUTIVATE Apply 1 application. topically daily.   furosemide 40 MG tablet Commonly known as: LASIX Take 1 tablet (40 mg total) by mouth 2 (two) times daily.   gabapentin 300 MG capsule Commonly known as: NEURONTIN Take 1 capsule (300 mg total) by mouth 2 (two) times daily.   insulin regular 100 units/mL injection Commonly known as: HumuLIN R Inject 0.3 mLs (30 Units total) into the skin 2 (two) times daily before a meal.   Insulin Syringe-Needle U-100 31G X 5/16" 1 ML Misc Commonly known as: RELION INSULIN SYRINGE 1ML/31G USE TO INJECT INSULIN TWICE DAILY AS DIRECTED. DX: E11.65   metoprolol succinate 50 MG 24 hr tablet Commonly known as: TOPROL-XL TAKE 1 TABLET BY MOUTH ONCE DAILY WITH OR IMMEDIATELY FOLLOWING A MEAL   MULTIVITAMIN ADULT PO Take 1 tablet by mouth daily.   nitroGLYCERIN 0.4 MG SL tablet Commonly known as: NITROSTAT Place 1 tablet (0.4 mg total) under the tongue every 5 (five) minutes as needed for chest pain.   polyethylene glycol powder 17 GM/SCOOP powder Commonly known as: GLYCOLAX/MIRALAX Take 17 g by mouth 2 (two) times daily as needed. What changed: reasons to take this   rosuvastatin 5 MG tablet Commonly known as: Crestor Take 1 tablet (5 mg total) by mouth daily.   Trulicity 3 XB/2.8UX Sopn Generic drug: Dulaglutide Inject 3 mg as directed once a week. [name] patient assistance program   VITAMIN B 12 PO Take 1,000 mcg by mouth daily.        Discharge Exam: There were no vitals filed for this  visit. General-appears in no acute distress Heart-S1-S2, regular, no murmur auscultated Lungs-clear to auscultation bilaterally, no wheezing or crackles auscultated Abdomen-soft, nontender, no organomegaly Extremities-no edema in the lower extremities Neuro-alert, oriented x3, no focal deficit noted  Condition at discharge: good  The results of significant diagnostics from this hospitalization (including imaging, microbiology, ancillary and laboratory) are listed below for reference.   Imaging Studies: CT ABDOMEN PELVIS WO CONTRAST  Result Date: 07/21/2022 CLINICAL DATA:  Abdominal pain. EXAM: CT ABDOMEN AND PELVIS WITHOUT CONTRAST TECHNIQUE: Multidetector CT imaging of the abdomen and pelvis was performed following the standard protocol without IV contrast. RADIATION DOSE REDUCTION: This exam was performed according to the departmental dose-optimization program which includes automated exposure control, adjustment of the mA and/or kV according to patient size and/or use of iterative reconstruction technique. COMPARISON:  CT of the chest abdomen pelvis dated 12/25/2018. FINDINGS: Evaluation of this exam is limited in the absence of intravenous contrast. Lower chest: Minimal bibasilar subpleural atelectasis. The visualized  lung bases are otherwise clear. There is advanced 3 vessel coronary vascular calcification and postsurgical changes of CABG. A cardiac pacemaker is noted. No intra-abdominal free air or free fluid. Hepatobiliary: The liver is unremarkable. No biliary dilatation. Multiple small stones within the gallbladder. No pericholecystic fluid or evidence of acute cholecystitis by CT. Pancreas: Unremarkable. No pancreatic ductal dilatation or surrounding inflammatory changes. Spleen: Normal in size without focal abnormality. Adrenals/Urinary Tract: The adrenal glands unremarkable. Mild bilateral renal parenchyma atrophy. There is no hydronephrosis or nephrolithiasis on either side. The  visualized ureters and urinary bladder appear unremarkable. Stomach/Bowel: There is thickened appearance of the gastric wall. There is air extending to the gastric mucosa and gastric wall predominantly in the gastric fundus and body of the stomach. There is gas within the short gastric veins as well as portal venous gas in the left lobe of the liver. Findings suspicious for bowel ischemia involving the stomach. Clinical correlation and surgical consult is advised. There is no bowel obstruction. The appendix is normal. Vascular/Lymphatic: Advanced aortoiliac atherosclerotic disease. The IVC is unremarkable. No portal venous gas. There is no adenopathy. The Reproductive: Prostate and seminal vesicles are grossly unremarkable. No acute mass. Other: None Musculoskeletal: Degenerative changes of the spine. No acute osseous pathology. IMPRESSION: 1. Findings suspicious for bowel ischemia involving the stomach. Clinical correlation and surgical consult is advised. 2. Cholelithiasis. 3. No hydronephrosis or nephrolithiasis. 4.  Aortic Atherosclerosis (ICD10-I70.0). These results were called by telephone at the time of interpretation on 07/21/2022 at 9:58 pm to provider Wynona Dove , who verbally acknowledged these results. Electronically Signed   By: Anner Crete M.D.   On: 07/21/2022 22:16    Microbiology: Results for orders placed or performed during the hospital encounter of 07/21/22  Resp Panel by RT-PCR (Flu A&B, Covid) Anterior Nasal Swab     Status: None   Collection Time: 07/21/22  7:43 PM   Specimen: Anterior Nasal Swab  Result Value Ref Range Status   SARS Coronavirus 2 by RT PCR NEGATIVE NEGATIVE Final    Comment: (NOTE) SARS-CoV-2 target nucleic acids are NOT DETECTED.  The SARS-CoV-2 RNA is generally detectable in upper respiratory specimens during the acute phase of infection. The lowest concentration of SARS-CoV-2 viral copies this assay can detect is 138 copies/mL. A negative result does  not preclude SARS-Cov-2 infection and should not be used as the sole basis for treatment or other patient management decisions. A negative result may occur with  improper specimen collection/handling, submission of specimen other than nasopharyngeal swab, presence of viral mutation(s) within the areas targeted by this assay, and inadequate number of viral copies(<138 copies/mL). A negative result must be combined with clinical observations, patient history, and epidemiological information. The expected result is Negative.  Fact Sheet for Patients:  EntrepreneurPulse.com.au  Fact Sheet for Healthcare Providers:  IncredibleEmployment.be  This test is no t yet approved or cleared by the [place] FDA and  has been authorized for detection and/or diagnosis of SARS-CoV-2 by FDA under an Emergency Use Authorization (EUA). This EUA will remain  in effect (meaning this test can be used) for the duration of the COVID-19 declaration under Section 564(b)(1) of the Act, 21 U.S.C.section 360bbb-3(b)(1), unless the authorization is terminated  or revoked sooner.       Influenza A by PCR NEGATIVE NEGATIVE Final   Influenza B by PCR NEGATIVE NEGATIVE Final    Comment: (NOTE) The Xpert Xpress SARS-CoV-2/FLU/RSV plus assay is intended as an aid in the diagnosis of  influenza from Nasopharyngeal swab specimens and should not be used as a sole basis for treatment. Nasal washings and aspirates are unacceptable for Xpert Xpress SARS-CoV-2/FLU/RSV testing.  Fact Sheet for Patients: EntrepreneurPulse.com.au  Fact Sheet for Healthcare Providers: IncredibleEmployment.be  This test is not yet approved or cleared by the [place] FDA and has been authorized for detection and/or diagnosis of SARS-CoV-2 by FDA under an Emergency Use Authorization (EUA). This EUA will remain in effect (meaning this test can be used) for the  duration of the COVID-19 declaration under Section 564(b)(1) of the Act, 21 U.S.C. section 360bbb-3(b)(1), unless the authorization is terminated or revoked.  Performed at [facility], [place] [address], [place], [place] [address]     Labs: CBC: Recent Labs  Lab 07/21/22 1947 07/22/22 0312  WBC 10.1 8.3  NEUTROABS 7.8* 5.9  HGB 13.3 12.7*  HCT 39.7 39.0  MCV 91.5 93.1  PLT 172 716   Basic Metabolic Panel: Recent Labs  Lab 07/21/22 1947 07/22/22 0312  NA 137 140  K 4.2 4.5  CL 102 102  CO2 22 20*  GLUCOSE 171* 93  BUN 68* 65*  CREATININE 3.25* 2.94*  CALCIUM 9.2 9.2  MG  --  2.0   Liver Function Tests: Recent Labs  Lab 07/21/22 1947 07/22/22 0312  AST 27 26  ALT 18 18  ALKPHOS 82 76  BILITOT 0.4 0.6  PROT 6.8 6.5  ALBUMIN 3.9 3.5   CBG: Recent Labs  Lab 07/22/22 0208 07/22/22 0745 07/22/22 1149  GLUCAP 96 95 120*    Discharge time spent: greater than 30 minutes.  Signed: Oswald Hillock, MD [facility] 07/22/2022

## 2022-07-24 ENCOUNTER — Telehealth: Payer: Self-pay

## 2022-07-24 LAB — CUP PACEART REMOTE DEVICE CHECK
Battery Remaining Longevity: 46 mo
Battery Voltage: 2.97 V
Brady Statistic AP VP Percent: 0.23 %
Brady Statistic AP VS Percent: 70.46 %
Brady Statistic AS VP Percent: 0.02 %
Brady Statistic AS VS Percent: 29.29 %
Brady Statistic RA Percent Paced: 70.68 %
Brady Statistic RV Percent Paced: 0.25 %
Date Time Interrogation Session: 20231123072403
HighPow Impedance: 50 Ohm
HighPow Impedance: 69 Ohm
Implantable Lead Connection Status: 753985
Implantable Lead Connection Status: 753985
Implantable Lead Implant Date: 20030519
Implantable Lead Implant Date: 20030519
Implantable Lead Location: 753859
Implantable Lead Location: 753860
Implantable Lead Model: 158
Implantable Lead Model: 4087
Implantable Lead Serial Number: 115102
Implantable Lead Serial Number: 159999
Implantable Pulse Generator Implant Date: 20171114
Lead Channel Impedance Value: 4047 Ohm
Lead Channel Impedance Value: 4047 Ohm
Lead Channel Impedance Value: 4047 Ohm
Lead Channel Impedance Value: 437 Ohm
Lead Channel Impedance Value: 608 Ohm
Lead Channel Impedance Value: 608 Ohm
Lead Channel Pacing Threshold Amplitude: 1 V
Lead Channel Pacing Threshold Amplitude: 1.25 V
Lead Channel Pacing Threshold Pulse Width: 0.4 ms
Lead Channel Pacing Threshold Pulse Width: 0.4 ms
Lead Channel Sensing Intrinsic Amplitude: 12.375 mV
Lead Channel Sensing Intrinsic Amplitude: 12.375 mV
Lead Channel Sensing Intrinsic Amplitude: 3.125 mV
Lead Channel Sensing Intrinsic Amplitude: 3.125 mV
Lead Channel Setting Pacing Amplitude: 2.5 V
Lead Channel Setting Pacing Amplitude: 2.5 V
Lead Channel Setting Pacing Pulse Width: 0.4 ms
Lead Channel Setting Sensing Sensitivity: 0.3 mV
Zone Setting Status: 755011

## 2022-07-24 NOTE — Telephone Encounter (Signed)
Transition Care Management Follow-up Telephone Call Date of discharge and from where: 07/22/22; [facility] How have you been since you were released from the hospital? Improving  Any questions or concerns? No  Items Reviewed: Did the pt receive and understand the discharge instructions provided? Yes  Medications obtained and verified? Yes  Other?  N/a Any new allergies since your discharge? No  Dietary orders reviewed? Yes Do you have support at home? Yes   Home Care and Equipment/Supplies: Were home health services ordered? no If so, what is the name of the agency? N/a  Has the agency set up a time to come to the patient's home? not applicable Were any new equipment or medical supplies ordered?  No What is the name of the medical supply agency? N/a Were you able to get the supplies/equipment? not applicable Do you have any questions related to the use of the equipment or supplies? No  Functional Questionnaire: (I = Independent and D = Dependent) ADLs: D  Bathing/Dressing- I  Meal Prep- D  Eating- I  Maintaining continence- I  Transferring/Ambulation- I  Managing Meds- D  Follow up appointments reviewed:  PCP Hospital f/u appt confirmed? Yes  Scheduled to see Dr. Warrick Parisian on 07/27/22 @ 9:55. [facility] f/u appt confirmed?  N/a   Are transportation arrangements needed? No  If their condition worsens, is the pt aware to call PCP or go to the Emergency Dept.? Yes Was the patient provided with contact information for the PCP's office or ED? Yes Was to pt encouraged to call back with questions or concerns? Yes

## 2022-07-27 ENCOUNTER — Encounter: Payer: Self-pay | Admitting: Family Medicine

## 2022-07-27 ENCOUNTER — Ambulatory Visit (INDEPENDENT_AMBULATORY_CARE_PROVIDER_SITE_OTHER): Payer: Medicare Other | Admitting: Family Medicine

## 2022-07-27 VITALS — BP 130/68 | HR 68 | Temp 97.4°F | Ht 64.0 in | Wt 215.0 lb

## 2022-07-27 DIAGNOSIS — K6389 Other specified diseases of intestine: Secondary | ICD-10-CM

## 2022-07-27 DIAGNOSIS — R197 Diarrhea, unspecified: Secondary | ICD-10-CM | POA: Diagnosis not present

## 2022-07-27 DIAGNOSIS — R112 Nausea with vomiting, unspecified: Secondary | ICD-10-CM

## 2022-07-27 DIAGNOSIS — E1165 Type 2 diabetes mellitus with hyperglycemia: Secondary | ICD-10-CM | POA: Diagnosis not present

## 2022-07-27 NOTE — Progress Notes (Signed)
BP 130/68   Pulse 68   Temp (!) 97.4 F (36.3 C)   Ht _0  (1.626 m)   Wt 215 lb (97.5 kg)   SpO2 99%   BMI 36.90 kg/m    Subjective:   Patient ID: Manuel Kline, male    DOB: 1932/05/20, 88 y.o.   MRN: [id number]  HPI: Manuel Kline is a 88 y.o. male presenting on 07/27/2022 for Hospitalization Follow-up (Vomiting, diarrhea)   HPI Patient was in ED on 07/21/22. He is passing gas since leaving and only had one BM since then. His abdominal pain is gone at that time. He has been doing light diet. The surgeon said to return if symptoms return.  Before he went to the ED he was having diarrhea for for 5 days but then he started having vomiting nonstop and went via EMS.  They did give him Zofran in the hospital and put him on a light diet and he says symptoms are much improved.  He still has a little twinge in the left side of his abdomen but otherwise doing very well.  He has not had any vomiting or diarrhea since he got out of the hospital.  He had 1 small bowel movement but he does not feel constipated or straining.  Relevant past medical, surgical, family and social history reviewed and updated as indicated. Interim medical history since our last visit reviewed. Allergies and medications reviewed and updated.  Review of Systems  Constitutional:  Negative for chills and fever.  Eyes:  Negative for visual disturbance.  Respiratory:  Negative for shortness of breath and wheezing.   Cardiovascular:  Negative for chest pain and leg swelling.  Gastrointestinal:  Positive for abdominal pain, diarrhea, nausea and vomiting. Negative for constipation.  Musculoskeletal:  Negative for back pain and gait problem.  Skin:  Negative for rash.  Neurological:  Negative for dizziness, weakness and light-headedness.  All other systems reviewed and are negative.   Per HPI unless specifically indicated above   Allergies as of 07/27/2022   No Known Allergies      Medication List        Accurate  as of July 27, 2022  2:44 PM. If you have any questions, ask your nurse or doctor.          allopurinol 300 MG tablet Commonly known as: ZYLOPRIM Take 1/2 (one-half) tablet by mouth once daily   ascorbic acid 500 MG tablet Commonly known as: VITAMIN C Take 500 mg by mouth daily.   Basaglar KwikPen 100 UNIT/ML Inject 25 Units into the skin daily.   benazepril 40 MG tablet Commonly known as: LOTENSIN Take 1 tablet (40 mg total) by mouth daily.   cloNIDine 0.1 MG tablet Commonly known as: CATAPRES Take 1 tablet (0.1 mg total) by mouth daily.   clopidogrel 75 MG tablet Commonly known as: PLAVIX Take 1 tablet (75 mg total) by mouth daily.   dapagliflozin propanediol 10 MG Tabs tablet Commonly known as: Farxiga Take 1 tablet (10 mg total) by mouth daily before breakfast.   DULoxetine 30 MG capsule Commonly known as: CYMBALTA Take 1 capsule (30 mg total) by mouth daily.   Fish Oil 1000 MG Cpdr Take 1 tablet by mouth daily.   fluticasone 0.05 % cream Commonly known as: CUTIVATE Apply 1 application. topically daily.   furosemide 40 MG tablet Commonly known as: LASIX Take 1 tablet (40 mg total) by mouth 2 (two) times daily.   gabapentin 300 MG  capsule Commonly known as: NEURONTIN Take 1 capsule (300 mg total) by mouth 2 (two) times daily.   insulin regular 100 units/mL injection Commonly known as: HumuLIN R Inject 0.3 mLs (30 Units total) into the skin 2 (two) times daily before a meal.   Insulin Syringe-Needle U-100 31G X 5/16" 1 ML Misc Commonly known as: RELION INSULIN SYRINGE 1ML/31G USE TO INJECT INSULIN TWICE DAILY AS DIRECTED. DX: E11.65   metoprolol succinate 50 MG 24 hr tablet Commonly known as: TOPROL-XL TAKE 1 TABLET BY MOUTH ONCE DAILY WITH OR IMMEDIATELY FOLLOWING A MEAL   MULTIVITAMIN ADULT PO Take 1 tablet by mouth daily.   nitroGLYCERIN 0.4 MG SL tablet Commonly known as: NITROSTAT Place 1 tablet (0.4 mg total) under the tongue every 5  (five) minutes as needed for chest pain.   polyethylene glycol powder 17 GM/SCOOP powder Commonly known as: GLYCOLAX/MIRALAX Take 17 g by mouth 2 (two) times daily as needed. What changed: reasons to take this   rosuvastatin 5 MG tablet Commonly known as: Crestor Take 1 tablet (5 mg total) by mouth daily.   Trulicity 3 CM/0.3KJ Sopn Generic drug: Dulaglutide Inject 3 mg as directed once a week. [name] patient assistance program   VITAMIN B 12 PO Take 1,000 mcg by mouth daily.         Objective:   BP 130/68   Pulse 68   Temp (!) 97.4 F (36.3 C)   Ht _0  (1.626 m)   Wt 215 lb (97.5 kg)   SpO2 99%   BMI 36.90 kg/m   Wt Readings from Last 3 Encounters:  07/27/22 215 lb (97.5 kg)  07/13/22 218 lb (98.9 kg)  04/09/22 225 lb (102.1 kg)    Physical Exam Vitals and nursing note reviewed.  Constitutional:      General: He is not in acute distress.    Appearance: He is well-developed. He is not diaphoretic.  Eyes:     General: No scleral icterus.    Conjunctiva/sclera: Conjunctivae normal.  Neck:     Thyroid: No thyromegaly.  Cardiovascular:     Rate and Rhythm: Normal rate and regular rhythm.     Heart sounds: Normal heart sounds. No murmur heard. Pulmonary:     Effort: Pulmonary effort is normal. No respiratory distress.     Breath sounds: Normal breath sounds. No wheezing.  Abdominal:     General: Abdomen is flat. Bowel sounds are normal. There is no distension.     Tenderness: There is no abdominal tenderness. There is no guarding or rebound.  Musculoskeletal:        General: No swelling. Normal range of motion.     Cervical back: Neck supple.  Lymphadenopathy:     Cervical: No cervical adenopathy.  Skin:    General: Skin is warm and dry.     Findings: No rash.  Neurological:     Mental Status: He is alert and oriented to person, place, and time.     Coordination: Coordination normal.  Psychiatric:        Behavior: Behavior normal.        Assessment & Plan:   Problem List Items Addressed This Visit   None Visit Diagnoses     Nausea vomiting and diarrhea    -  Primary   Relevant Orders   CBC with Differential/Platelet   CMP14+EGFR   Pneumatosis intestinalis       Relevant Orders   CBC with Differential/Platelet   CMP14+EGFR  They were able to defer surgery at this point but did recommend that if anything returns he is going back to the hospital.  He is on Trulicity and that could be concerning for the pneumatosis and we discussed this but his blood sugars are running so good right now and this is the first time he is having issues so we will hold off on changing it but that may be something we have to decrease in the future. Follow up plan: Return if symptoms worsen or fail to improve.  Counseling provided for all of the vaccine components Orders Placed This Encounter  Procedures   CBC with Differential/Platelet   Beverly Corona Popovich, MD [facility] 07/27/2022, 2:44 PM

## 2022-07-28 LAB — CBC WITH DIFFERENTIAL/PLATELET
Basophils Absolute: 0 10*3/uL (ref 0.0–0.2)
Basos: 0 %
EOS (ABSOLUTE): 0.2 10*3/uL (ref 0.0–0.4)
Eos: 3 %
Hematocrit: 40.3 % (ref 37.5–51.0)
Hemoglobin: 13.5 g/dL (ref 13.0–17.7)
Immature Grans (Abs): 0 10*3/uL (ref 0.0–0.1)
Immature Granulocytes: 0 %
Lymphocytes Absolute: 2 10*3/uL (ref 0.7–3.1)
Lymphs: 27 %
MCH: 30.5 pg (ref 26.6–33.0)
MCHC: 33.5 g/dL (ref 31.5–35.7)
MCV: 91 fL (ref 79–97)
Monocytes Absolute: 0.5 10*3/uL (ref 0.1–0.9)
Monocytes: 6 %
Neutrophils Absolute: 4.7 10*3/uL (ref 1.4–7.0)
Neutrophils: 64 %
Platelets: 214 10*3/uL (ref 150–450)
RBC: 4.42 x10E6/uL (ref 4.14–5.80)
RDW: 14.5 % (ref 11.6–15.4)
WBC: 7.4 10*3/uL (ref 3.4–10.8)

## 2022-07-28 LAB — CMP14+EGFR
ALT: 45 IU/L — ABNORMAL HIGH (ref 0–44)
AST: 44 IU/L — ABNORMAL HIGH (ref 0–40)
Albumin/Globulin Ratio: 1.6 (ref 1.2–2.2)
Albumin: 4.1 g/dL (ref 3.7–4.7)
Alkaline Phosphatase: 108 IU/L (ref 44–121)
BUN/Creatinine Ratio: 14 (ref 10–24)
BUN: 30 mg/dL — ABNORMAL HIGH (ref 8–27)
Bilirubin Total: 0.4 mg/dL (ref 0.0–1.2)
CO2: 25 mmol/L (ref 20–29)
Calcium: 9.5 mg/dL (ref 8.6–10.2)
Chloride: 99 mmol/L (ref 96–106)
Creatinine, Ser: 2.16 mg/dL — ABNORMAL HIGH (ref 0.76–1.27)
Globulin, Total: 2.6 g/dL (ref 1.5–4.5)
Glucose: 159 mg/dL — ABNORMAL HIGH (ref 70–99)
Potassium: 4.4 mmol/L (ref 3.5–5.2)
Sodium: 140 mmol/L (ref 134–144)
Total Protein: 6.7 g/dL (ref 6.0–8.5)
eGFR: 29 mL/min/{1.73_m2} — ABNORMAL LOW (ref >59)

## 2022-07-31 ENCOUNTER — Other Ambulatory Visit: Payer: Self-pay | Admitting: Family Medicine

## 2022-07-31 DIAGNOSIS — E782 Mixed hyperlipidemia: Secondary | ICD-10-CM

## 2022-07-31 DIAGNOSIS — E1142 Type 2 diabetes mellitus with diabetic polyneuropathy: Secondary | ICD-10-CM

## 2022-07-31 DIAGNOSIS — E1169 Type 2 diabetes mellitus with other specified complication: Secondary | ICD-10-CM

## 2022-07-31 DIAGNOSIS — Z794 Long term (current) use of insulin: Secondary | ICD-10-CM

## 2022-08-07 ENCOUNTER — Encounter: Payer: Self-pay | Admitting: Podiatry

## 2022-08-07 ENCOUNTER — Ambulatory Visit (INDEPENDENT_AMBULATORY_CARE_PROVIDER_SITE_OTHER): Payer: Medicare Other | Admitting: Podiatry

## 2022-08-07 VITALS — BP 165/90

## 2022-08-07 DIAGNOSIS — B351 Tinea unguium: Secondary | ICD-10-CM | POA: Diagnosis not present

## 2022-08-07 DIAGNOSIS — M2012 Hallux valgus (acquired), left foot: Secondary | ICD-10-CM | POA: Diagnosis not present

## 2022-08-07 DIAGNOSIS — E1142 Type 2 diabetes mellitus with diabetic polyneuropathy: Secondary | ICD-10-CM

## 2022-08-07 DIAGNOSIS — M79674 Pain in right toe(s): Secondary | ICD-10-CM

## 2022-08-07 DIAGNOSIS — D689 Coagulation defect, unspecified: Secondary | ICD-10-CM | POA: Diagnosis not present

## 2022-08-07 DIAGNOSIS — M79675 Pain in left toe(s): Secondary | ICD-10-CM | POA: Diagnosis not present

## 2022-08-07 NOTE — Progress Notes (Signed)
This patient returns to my office for at risk foot care.  This patient requires this care by a professional since this patient will be at risk due to having diabetic neuropathy claudication in PVD and CKD.  This patient is unable to cut nails himself since the patient cannot reach his nails.These nails are painful walking and wearing shoes. Patient presents to the office with his daughter.  His daughter says he exhibits severe pain walking due to bunion left foot.  He received an injection last visit which helped but pain has returned.  He also has swelling on top of left foot.    This patient presents for at risk foot care today.  General Appearance  Alert, conversant and in no acute stress.  Vascular  Dorsalis pedis and posterior tibial  pulses are weakly  palpable  bilaterally.  Capillary return is within normal limits  bilaterally  Cold feet  bilaterally.  Absent digital hair noted.  Neurologic  Senn-Weinstein monofilament wire test diminished  bilaterally. Muscle power within normal limits bilaterally.  Nails Thick disfigured discolored nails with subungual debris  from hallux to fifth toes bilaterally. No evidence of bacterial infection or drainage bilaterally.  Orthopedic  No limitations of motion  feet .  No crepitus or effusions noted.  No bony pathology or digital deformities noted.  Plantar flexed fifth metatarsal head left foot.  HAV  left  foot with increased temperature and swelling.  Skin  normotropic skin with no porokeratosis noted bilaterally.  No signs of infections or ulcers noted.     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  Consent was obtained for treatment procedures.   Mechanical debridement of nails 1-5  bilaterally performed with a nail nipper.  Filed with dremel without incident. Discussed bunion with daughter and patient.  To refer to medical podiatrists for evaluation.  In the meantime I recommended he limit his shoe use since the shoe  might be causing his bunion pain.   Return office visit  3 months                    Told patient to return for periodic foot care and evaluation due to potential at risk complications.   Gardiner Barefoot DPM

## 2022-08-13 NOTE — Progress Notes (Signed)
Remote ICD transmission.   

## 2022-08-17 ENCOUNTER — Ambulatory Visit (INDEPENDENT_AMBULATORY_CARE_PROVIDER_SITE_OTHER): Payer: Medicare Other

## 2022-08-17 ENCOUNTER — Other Ambulatory Visit: Payer: Self-pay | Admitting: Podiatry

## 2022-08-17 ENCOUNTER — Ambulatory Visit: Payer: Medicare Other | Admitting: Podiatry

## 2022-08-17 DIAGNOSIS — M1000 Idiopathic gout, unspecified site: Secondary | ICD-10-CM

## 2022-08-17 DIAGNOSIS — M779 Enthesopathy, unspecified: Secondary | ICD-10-CM

## 2022-08-17 DIAGNOSIS — M21619 Bunion of unspecified foot: Secondary | ICD-10-CM

## 2022-08-17 NOTE — Progress Notes (Unsigned)
Subjective: He had an injury and resulted in nerve damage. He has neopathy as well from diabetes. He is stage 3 renal failure. They have tried creams which have not helped. He had an injection previously and it only helped for a few hours. No recent injuries.   Objective: AAO x3, NAD DP/PT pulses palpable bilaterally, CRT less than 3 seconds Protective sensation intact with Simms Weinstein monofilament, vibratory sensation intact, Achilles tendon reflex intact No areas of pinpoint bony tenderness or pain with vibratory sensation. MMT 5/5, ROM WNL. No edema, erythema, increase in warmth to bilateral lower extremities.  No open lesions or pre-ulcerative lesions.  No pain with calf compression, swelling, warmth, erythema  Assessment:  Plan: -All treatment options discussed with the patient including all alternatives, risks, complications.   -Patient encouraged to call the office with any questions, concerns, change in symptoms.

## 2022-08-18 DIAGNOSIS — G4733 Obstructive sleep apnea (adult) (pediatric): Secondary | ICD-10-CM | POA: Diagnosis not present

## 2022-08-18 LAB — URIC ACID: Uric Acid: 6.1 mg/dL (ref 3.8–8.4)

## 2022-08-21 DIAGNOSIS — G4733 Obstructive sleep apnea (adult) (pediatric): Secondary | ICD-10-CM | POA: Diagnosis not present

## 2022-08-26 ENCOUNTER — Other Ambulatory Visit: Payer: Self-pay | Admitting: Podiatry

## 2022-08-26 DIAGNOSIS — M1000 Idiopathic gout, unspecified site: Secondary | ICD-10-CM

## 2022-08-26 DIAGNOSIS — M21619 Bunion of unspecified foot: Secondary | ICD-10-CM

## 2022-08-26 DIAGNOSIS — M779 Enthesopathy, unspecified: Secondary | ICD-10-CM

## 2022-08-27 DIAGNOSIS — E1165 Type 2 diabetes mellitus with hyperglycemia: Secondary | ICD-10-CM | POA: Diagnosis not present

## 2022-09-08 ENCOUNTER — Telehealth: Payer: Medicare Other

## 2022-09-15 ENCOUNTER — Encounter: Payer: Self-pay | Admitting: Cardiology

## 2022-09-15 ENCOUNTER — Ambulatory Visit: Payer: Medicare Other | Attending: Cardiology | Admitting: Cardiology

## 2022-09-15 ENCOUNTER — Other Ambulatory Visit: Payer: Self-pay | Admitting: Family Medicine

## 2022-09-15 VITALS — BP 124/60 | HR 71 | Ht 64.0 in | Wt 216.0 lb

## 2022-09-15 DIAGNOSIS — Z95810 Presence of automatic (implantable) cardiac defibrillator: Secondary | ICD-10-CM | POA: Diagnosis not present

## 2022-09-15 DIAGNOSIS — I255 Ischemic cardiomyopathy: Secondary | ICD-10-CM | POA: Diagnosis not present

## 2022-09-15 DIAGNOSIS — I10 Essential (primary) hypertension: Secondary | ICD-10-CM

## 2022-09-15 DIAGNOSIS — I2510 Atherosclerotic heart disease of native coronary artery without angina pectoris: Secondary | ICD-10-CM | POA: Diagnosis not present

## 2022-09-15 DIAGNOSIS — I5022 Chronic systolic (congestive) heart failure: Secondary | ICD-10-CM

## 2022-09-15 DIAGNOSIS — Z951 Presence of aortocoronary bypass graft: Secondary | ICD-10-CM

## 2022-09-15 DIAGNOSIS — G4733 Obstructive sleep apnea (adult) (pediatric): Secondary | ICD-10-CM | POA: Diagnosis not present

## 2022-09-15 NOTE — Patient Instructions (Addendum)

## 2022-09-15 NOTE — Progress Notes (Signed)
Cardiology Office Note:    Date:  09/15/2022   ID:  Manuel Kline, DOB 1931-09-23, MRN [id number]  PCP:  Dettinger, Fransisca Kaufmann, MD  Cardiologist:  Jenne Campus, MD    Referring MD: Dettinger, Fransisca Kaufmann, MD   Chief Complaint  Patient presents with   Follow-up  Doing very well  History of Present Illness:    Manuel Kline is a 89 y.o. male with past medical history significant for coronary artery disease, status post coronary bypass graft done by Dr. Servando Snare in 2002, history of cardiomyopathy which is ischemic with latest ejection fraction checked in summer 2022 of 50%, ICD present which is Medtronic device, diabetes mellitus, obstructive sleep apnea, essential hypertension, diabetes, hard of hearing. He comes today to months for follow-up overall doing very well.  Denies have any chest pain tightness squeezing pressure burning chest, no discharges from defibrillator.  Pain fall of last year he end up going to the emergency room because of some GI symptoms he was dehydrated, dose of diuretics has been reduced and since that time he is doing well.  Past Medical History:  Diagnosis Date   Adenomatous colon polyp 2006   CAD (coronary artery disease)    Calcium oxalate renal stones    Cardiomyopathy    Cataract    Cataract    Cerebral embolism with cerebral infarction 0/05/3817   Chronic systolic heart failure (HCC)         CKD (chronic kidney disease) stage 4, GFR 15-29 ml/min ([place]) 11/08/2017   Claudication in peripheral vascular disease ([place]) 07/23/2020   Diabetes ([place])    Diabetic peripheral neuropathy ([place]) 01/31/2015   Dual implantable cardioverter-defibrillator in situ    01/16/2002 Dr. Lovena Le  RA lead  Guidant [id number] RV lead  Guidant [id number] Generator  Guidant Prism  09/02/2009 Generator change Medtronic [id number]  SN  [id number]      Erectile dysfunction    Erosive esophagitis    Essential hypertension 11/30/2015   Hemorrhoids    History of TIA (transient ischemic attack)  01/19/2019   HLD (hyperlipidemia)    HTN (hypertension)    Hyperlipidemia    Hypertensive heart disease without CHF 07/31/2011   ICD (implantable cardiac defibrillator) in place    ICD dual chamber in situ    Ischemic cardiomyopathy    EF 40% June 2013    Left-sided weakness 09/01/5850   Metabolic syndrome    Moderate nonproliferative diabetic retinopathy of both eyes without macular edema associated with type 2 diabetes mellitus ([place]) 02/20/2020   Morbid obesity ([place])    OSA on CPAP 11/05/2014   Osteoarthritis    Posterior vitreous detachment of both eyes 02/20/2020   Presence of automatic (implantable) cardiac defibrillator 11/08/2017   S/P CABG (coronary artery bypass graft) 11/02/2000   Sleep apnea    Stroke-like symptoms 12/26/2018   Syncope 08/06/2009   Qualifier: Diagnosis of  By: Selena Batten CMA, [place]     Type 2 diabetes, uncontrolled, with neuropathy    Has retinopahty and neuropathy     Ventricular tachycardia (paroxysmal) ([place]) 07/14/2016    Past Surgical History:  Procedure Laterality Date   ABDOMINAL EXPLORATION SURGERY     BACK SURGERY     X'3   cardiac bypass     CARDIAC DEFIBRILLATOR PLACEMENT     CARPAL TUNNEL RELEASE     X2, bilateral   CATARACT EXTRACTION     COLONOSCOPY  06/20/2012   Procedure: COLONOSCOPY;  Surgeon: Sable Feil, MD;  Location: [facility];  Service: Endoscopy;  Laterality: N/A;   DOPPLER ECHOCARDIOGRAPHY  2003   EP IMPLANTABLE DEVICE N/A 07/14/2016   Procedure: ICD Generator Changeout;  Surgeon: Evans Lance, MD;  Location: [facility];  Service: Cardiovascular;  Laterality: N/A;   ESOPHAGOGASTRODUODENOSCOPY  06/20/2012   Procedure: ESOPHAGOGASTRODUODENOSCOPY (EGD);  Surgeon: Sable Feil, MD;  Location: [facility];  Service: Endoscopy;  Laterality: N/A;   EYE SURGERY     LAPAROTOMY     RETINOPATHY SURGERY Bilateral    rotator cuff surgery     left    Current Medications: Current Meds  Medication Sig   allopurinol  (ZYLOPRIM) 300 MG tablet Take 1/2 (one-half) tablet by mouth once daily (Patient taking differently: Take 150 mg by mouth daily. Take 1/2 (one-half) tablet by mouth once daily)   benazepril (LOTENSIN) 40 MG tablet Take 1 tablet by mouth once daily   cloNIDine (CATAPRES) 0.1 MG tablet Take 1 tablet (0.1 mg total) by mouth daily.   clopidogrel (PLAVIX) 75 MG tablet Take 1 tablet (75 mg total) by mouth daily.   Cyanocobalamin (VITAMIN B 12 PO) Take 1,000 mcg by mouth daily.   dapagliflozin propanediol (FARXIGA) 10 MG TABS tablet Take 1 tablet (10 mg total) by mouth daily before breakfast.   Dulaglutide (TRULICITY) 3 SE/8.3TD SOPN Inject 3 mg as directed once a week. [name] patient assistance program   DULoxetine (CYMBALTA) 30 MG capsule Take 1 capsule (30 mg total) by mouth daily.   fluticasone (CUTIVATE) 0.05 % cream Apply 1 application. topically daily.   furosemide (LASIX) 40 MG tablet Take 1 tablet (40 mg total) by mouth 2 (two) times daily. (Patient taking differently: Take 40 mg by mouth in the morning.)   gabapentin (NEURONTIN) 300 MG capsule Take 1 capsule (300 mg total) by mouth 2 (two) times daily.   Insulin Glargine (BASAGLAR KWIKPEN) 100 UNIT/ML Inject 25 Units into the skin daily.   insulin regular (HUMULIN R) 100 units/mL injection Inject 0.3 mLs (30 Units total) into the skin 2 (two) times daily before a meal.   Insulin Syringe-Needle U-100 (RELION INSULIN SYRINGE 1ML/31G) 31G X 5/16" 1 ML MISC USE TO INJECT INSULIN TWICE DAILY AS DIRECTED. DX: E11.65 (Patient taking differently: 1 each by Other route See admin instructions. USE TO INJECT INSULIN TWICE DAILY AS DIRECTED. DX: E11.65)   metoprolol succinate (TOPROL-XL) 50 MG 24 hr tablet TAKE 1 TABLET BY MOUTH ONCE DAILY WITH OR IMMEDIATELY FOLLOWING A MEAL (Patient taking differently: Take 50 mg by mouth daily. TAKE 1 TABLET BY MOUTH ONCE DAILY WITH OR IMMEDIATELY FOLLOWING A MEAL)   Multiple Vitamin (MULTIVITAMIN ADULT PO) Take 1  tablet by mouth daily.   nitroGLYCERIN (NITROSTAT) 0.4 MG SL tablet Place 1 tablet (0.4 mg total) under the tongue every 5 (five) minutes as needed for chest pain.   Omega-3 Fatty Acids (FISH OIL) 1000 MG CPDR Take 1 tablet by mouth daily.   polyethylene glycol powder (GLYCOLAX/MIRALAX) powder Take 17 g by mouth 2 (two) times daily as needed. (Patient taking differently: Take 17 g by mouth 2 (two) times daily as needed for mild constipation or moderate constipation.)   rosuvastatin (CRESTOR) 5 MG tablet Take 1 tablet by mouth once daily   vitamin C (ASCORBIC ACID) 500 MG tablet Take 500 mg by mouth daily.     Allergies:   Patient has no known allergies.   Social History   Socioeconomic History   Marital status: Married  Spouse name: [name]   Number of children: 4   Years of education: 11   Highest education level: 11th grade  Occupational History   Occupation: Retired from [employer]: RETIRED  Tobacco Use   Smoking status: Never   Smokeless tobacco: Never  Vaping Use   Vaping Use: Never used  Substance and Sexual Activity   Alcohol use: No    Alcohol/week: 0.0 standard drinks of alcohol   Drug use: No   Sexual activity: Yes  Other Topics Concern   Not on file  Social History Narrative   Lives at home with wife   Right handed, Married, 4 kids from previous marriage.  Retired.     Caffeine 1 cup daily.   Social Determinants of Health   Financial Resource Strain: Low Risk  (07/20/2022)   Overall Financial Resource Strain (CARDIA)    Difficulty of Paying Living Expenses: Not hard at all  Food Insecurity: No Food Insecurity (07/20/2022)   Hunger Vital Sign    Worried About Running Out of Food in the Last Year: Never true    Ran Out of Food in the Last Year: Never true  Transportation Needs: No Transportation Needs (07/20/2022)   PRAPARE - Hydrologist (Medical): No    Lack of Transportation (Non-Medical): No  Physical  Activity: Insufficiently Active (12/10/2021)   Exercise Vital Sign    Days of Exercise per Week: 5 days    Minutes of Exercise per Session: 20 min  Stress: No Stress Concern Present (05/20/2022)   [place]    Feeling of Stress : Not at all  Social Connections: [place] (05/06/2022)   Social Connection and Isolation Panel [NHANES]    Frequency of Communication with Friends and Family: More than three times a week    Frequency of Social Gatherings with Friends and Family: More than three times a week    Attends Religious Services: More than 4 times per year    Active Member of [employer] or Organizations: Yes    Attends Music therapist: More than 4 times per year    Marital Status: Married     Family History: The patient's family history includes Cancer in his brother; Diabetes in his brother, brother, brother, mother, sister, sister, sister, sister, and sister; Heart disease in his brother, father, and mother; Kidney disease in his sister; Throat cancer in his paternal uncle. ROS:   Please see the history of present illness.    All 14 point review of systems negative except as described per history of present illness  EKGs/Labs/Other Studies Reviewed:      Recent Labs: 07/22/2022: Magnesium 2.0 07/27/2022: ALT 45; BUN 30; Creatinine, Ser 2.16; Hemoglobin 13.5; Platelets 214; Potassium 4.4; Sodium 140  Recent Lipid Panel    Component Value Date/Time   CHOL 152 07/13/2022 1429   TRIG 291 (H) 07/13/2022 1429   HDL 35 (L) 07/13/2022 1429   CHOLHDL 4.3 07/13/2022 1429   CHOLHDL 3.4 12/26/2018 0558   VLDL 36 12/26/2018 0558   LDLCALC 70 07/13/2022 1429    Physical Exam:    VS:  BP 124/60 (BP Location: Left Arm, Patient Position: Sitting)   Pulse 71   Ht '5\' 4"'$  (1.626 m)   Wt 216 lb 0.6 oz (98 kg)   SpO2 96%   BMI 37.08 kg/m     Wt Readings from Last 3 Encounters:  09/15/22 216 lb  0.6 oz  (98 kg)  07/27/22 215 lb (97.5 kg)  07/13/22 218 lb (98.9 kg)     GEN:  Well nourished, well developed in no acute distress HEENT: Normal NECK: No JVD; No carotid bruits LYMPHATICS: No lymphadenopathy CARDIAC: RRR, no murmurs, no rubs, no gallops RESPIRATORY:  Clear to auscultation without rales, wheezing or rhonchi  ABDOMEN: Soft, non-tender, non-distended MUSCULOSKELETAL:  No edema; No deformity  SKIN: Warm and dry LOWER EXTREMITIES: no swelling NEUROLOGIC:  Alert and oriented x 3 PSYCHIATRIC:  Normal affect   ASSESSMENT:    1. Coronary artery disease involving native coronary artery of native heart without angina pectoris   2. Chronic systolic heart failure ([place])   3. Essential hypertension   4. Ischemic cardiomyopathy   5. OSA on CPAP   6. Dual implantable cardioverter-defibrillator in situ   7. S/P CABG (coronary artery bypass graft)    PLAN:    In order of problems listed above:  Coronary artery disease stable on appropriate guideline directed medical therapy which I will continue.  No recent events, no symptoms indicating reactivation of the problem. Chronic systolic congestive heart failure.  He seems to be compensated, will ask him to have echocardiogram done to assess left ventricle ejection fraction. Dyslipidemia I did review his K PN which show me his LDL 70 HDL 35 this is from November of last year good cholesterol profile will continue. Obstructive sleep apnea: Stable.  Using CPAP mask. ICD present is a Medtronic device dual-chamber, BiV, I did review his interrogation OptiVol stable   Medication Adjustments/Labs and Tests Ordered: Current medicines are reviewed at length with the patient today.  Concerns regarding medicines are outlined above.  No orders of the defined types were placed in this encounter.  Medication changes: No orders of the defined types were placed in this encounter.   Signed, Park Liter, MD, [facility] ([place] 09/15/2022 11:40 AM    [place]

## 2022-09-28 ENCOUNTER — Ambulatory Visit: Payer: Medicare Other | Admitting: Podiatry

## 2022-10-02 ENCOUNTER — Ambulatory Visit (HOSPITAL_BASED_OUTPATIENT_CLINIC_OR_DEPARTMENT_OTHER)
Admission: RE | Admit: 2022-10-02 | Discharge: 2022-10-02 | Disposition: A | Discharge status: Home / Self Care | Payer: Medicare Other | Source: Ambulatory Visit | Attending: Cardiology | Admitting: Cardiology

## 2022-10-02 DIAGNOSIS — I255 Ischemic cardiomyopathy: Secondary | ICD-10-CM

## 2022-10-02 DIAGNOSIS — R600 Localized edema: Secondary | ICD-10-CM

## 2022-10-02 MED ORDER — PERFLUTREN LIPID MICROSPHERE
1.0000 mL | INTRAVENOUS | Status: AC | PRN
  Administered 2022-10-02: 3 mL via INTRAVENOUS

## 2022-10-03 LAB — ECHOCARDIOGRAM COMPLETE
AR max vel: 2.28 cm2
AV Area VTI: 2.25 cm2
AV Area mean vel: 2.04 cm2
AV Mean grad: 5 mmHg
AV Peak grad: 8.8 mmHg
Ao pk vel: 1.48 m/s
Area-P 1/2: 2.53 cm2
S' Lateral: 2.1 cm

## 2022-10-06 ENCOUNTER — Encounter (INDEPENDENT_AMBULATORY_CARE_PROVIDER_SITE_OTHER): Payer: Medicare Other | Admitting: Ophthalmology

## 2022-10-14 ENCOUNTER — Encounter: Payer: Self-pay | Admitting: Family Medicine

## 2022-10-14 ENCOUNTER — Ambulatory Visit (INDEPENDENT_AMBULATORY_CARE_PROVIDER_SITE_OTHER): Payer: Medicare Other | Admitting: Family Medicine

## 2022-10-14 VITALS — BP 114/58 | HR 77 | Ht 64.0 in | Wt 216.0 lb

## 2022-10-14 DIAGNOSIS — Z8673 Personal history of transient ischemic attack (TIA), and cerebral infarction without residual deficits: Secondary | ICD-10-CM

## 2022-10-14 DIAGNOSIS — E1169 Type 2 diabetes mellitus with other specified complication: Secondary | ICD-10-CM | POA: Diagnosis not present

## 2022-10-14 DIAGNOSIS — I130 Hypertensive heart and chronic kidney disease with heart failure and stage 1 through stage 4 chronic kidney disease, or unspecified chronic kidney disease: Secondary | ICD-10-CM | POA: Diagnosis not present

## 2022-10-14 DIAGNOSIS — E113393 Type 2 diabetes mellitus with moderate nonproliferative diabetic retinopathy without macular edema, bilateral: Secondary | ICD-10-CM | POA: Diagnosis not present

## 2022-10-14 DIAGNOSIS — N184 Chronic kidney disease, stage 4 (severe): Secondary | ICD-10-CM

## 2022-10-14 DIAGNOSIS — E782 Mixed hyperlipidemia: Secondary | ICD-10-CM | POA: Diagnosis not present

## 2022-10-14 DIAGNOSIS — I10 Essential (primary) hypertension: Secondary | ICD-10-CM

## 2022-10-14 DIAGNOSIS — E1122 Type 2 diabetes mellitus with diabetic chronic kidney disease: Secondary | ICD-10-CM

## 2022-10-14 DIAGNOSIS — E1142 Type 2 diabetes mellitus with diabetic polyneuropathy: Secondary | ICD-10-CM | POA: Diagnosis not present

## 2022-10-14 DIAGNOSIS — I5022 Chronic systolic (congestive) heart failure: Secondary | ICD-10-CM

## 2022-10-14 DIAGNOSIS — Z794 Long term (current) use of insulin: Secondary | ICD-10-CM

## 2022-10-14 LAB — CMP14+EGFR
ALT: 22 IU/L (ref 0–44)
AST: 23 IU/L (ref 0–40)
Albumin/Globulin Ratio: 1.7 (ref 1.2–2.2)
Albumin: 4.2 g/dL (ref 3.7–4.7)
Alkaline Phosphatase: 107 IU/L (ref 44–121)
BUN/Creatinine Ratio: 20 (ref 10–24)
BUN: 45 mg/dL — ABNORMAL HIGH (ref 8–27)
Bilirubin Total: 0.5 mg/dL (ref 0.0–1.2)
CO2: 24 mmol/L (ref 20–29)
Calcium: 8.9 mg/dL (ref 8.6–10.2)
Chloride: 97 mmol/L (ref 96–106)
Creatinine, Ser: 2.25 mg/dL — ABNORMAL HIGH (ref 0.76–1.27)
Globulin, Total: 2.5 g/dL (ref 1.5–4.5)
Glucose: 163 mg/dL — ABNORMAL HIGH (ref 70–99)
Potassium: 4.8 mmol/L (ref 3.5–5.2)
Sodium: 136 mmol/L (ref 134–144)
Total Protein: 6.7 g/dL (ref 6.0–8.5)
eGFR: 27 mL/min/{1.73_m2} — ABNORMAL LOW (ref >59)

## 2022-10-14 LAB — LIPID PANEL
Chol/HDL Ratio: 3.6 ratio (ref 0.0–5.0)
Cholesterol, Total: 138 mg/dL (ref 100–199)
HDL: 38 mg/dL — ABNORMAL LOW (ref >39)
LDL Chol Calc (NIH): 67 mg/dL (ref 0–99)
Triglycerides: 199 mg/dL — ABNORMAL HIGH (ref 0–149)
VLDL Cholesterol Cal: 33 mg/dL (ref 5–40)

## 2022-10-14 LAB — CBC WITH DIFFERENTIAL/PLATELET
Basophils Absolute: 0 10*3/uL (ref 0.0–0.2)
Basos: 0 %
EOS (ABSOLUTE): 0.1 10*3/uL (ref 0.0–0.4)
Eos: 2 %
Hematocrit: 40.8 % (ref 37.5–51.0)
Hemoglobin: 13.9 g/dL (ref 13.0–17.7)
Immature Grans (Abs): 0 10*3/uL (ref 0.0–0.1)
Immature Granulocytes: 0 %
Lymphocytes Absolute: 2.2 10*3/uL (ref 0.7–3.1)
Lymphs: 27 %
MCH: 29.8 pg (ref 26.6–33.0)
MCHC: 34.1 g/dL (ref 31.5–35.7)
MCV: 88 fL (ref 79–97)
Monocytes Absolute: 0.5 10*3/uL (ref 0.1–0.9)
Monocytes: 6 %
Neutrophils Absolute: 5.2 10*3/uL (ref 1.4–7.0)
Neutrophils: 65 %
Platelets: 200 10*3/uL (ref 150–450)
RBC: 4.66 x10E6/uL (ref 4.14–5.80)
RDW: 14.1 % (ref 11.6–15.4)
WBC: 8 10*3/uL (ref 3.4–10.8)

## 2022-10-14 LAB — BAYER DCA HB A1C WAIVED: HB A1C (BAYER DCA - WAIVED): 7.5 % — ABNORMAL HIGH (ref 4.8–5.6)

## 2022-10-14 MED ORDER — BD PEN NEEDLE MINI U/F 31G X 5 MM MISC: 1.0000 | Freq: Four times a day (QID) | 11 refills | Status: AC

## 2022-10-14 MED ORDER — ROSUVASTATIN CALCIUM 5 MG PO TABS: 5.0000 mg | ORAL_TABLET | Freq: Every day | ORAL | 3 refills | Status: DC

## 2022-10-14 MED ORDER — FUROSEMIDE 40 MG PO TABS: 40.0000 mg | ORAL_TABLET | Freq: Two times a day (BID) | ORAL | 3 refills | Status: DC

## 2022-10-14 MED ORDER — BENAZEPRIL HCL 40 MG PO TABS: 40.0000 mg | ORAL_TABLET | Freq: Every day | ORAL | 3 refills | Status: DC

## 2022-10-14 MED ORDER — CLOPIDOGREL BISULFATE 75 MG PO TABS: 75.0000 mg | ORAL_TABLET | Freq: Every day | ORAL | 3 refills | Status: DC

## 2022-10-14 MED ORDER — DULOXETINE HCL 60 MG PO CPEP: 60.0000 mg | ORAL_CAPSULE | Freq: Every day | ORAL | 3 refills | Status: DC

## 2022-10-14 NOTE — Progress Notes (Signed)
BP (!) 114/58   Pulse 77   Ht 5' 4"$  (1.626 m)   Wt 216 lb (98 kg)   SpO2 100%   BMI 37.08 kg/m    Subjective:   Patient ID: Manuel Kline, male    DOB: [birth year], 89 y.o.   MRN: [id number]  HPI: Manuel Kline is a 89 y.o. male presenting on 10/14/2022 for Medical Management of Chronic Issues, Hyperlipidemia, and Diabetes   HPI Hypertension Patient is currently on clonidine and metoprolol and benazepril and furosemide, and their blood pressure today is 114/58. Patient denies any lightheadedness or dizziness. Patient denies headaches, blurred vision, chest pains, shortness of breath, or weakness. Denies any side effects from medication and is content with current medication.   Hyperlipidemia Patient is coming in for recheck of his hyperlipidemia. The patient is currently taking fish oil and Crestor. They deny any issues with myalgias or history of liver damage from it. They deny any focal numbness or weakness or chest pain.   Type 2 diabetes mellitus Patient comes in today for recheck of his diabetes. Patient has been currently taking Trulicity and Engineer, agricultural. Patient is currently on an ACE inhibitor/ARB. Patient has not seen an ophthalmologist this year. The symptom started onset as an adult retinopathy and neuropathy and CKD and CHF ARE RELATED TO DM.  He says pain and numbness and feeling swelling and pain in his feet has worsened.  I do believe it is related to neuropathy.  Relevant past medical, surgical, family and social history reviewed and updated as indicated. Interim medical history since our last visit reviewed. Allergies and medications reviewed and updated.  Review of Systems  Constitutional:  Negative for chills and fever.  Respiratory:  Negative for shortness of breath and wheezing.   Cardiovascular:  Positive for leg swelling. Negative for chest pain.  Musculoskeletal:  Negative for back pain and gait problem.  Skin:  Negative for color change, rash and wound.   Neurological:  Positive for numbness.  All other systems reviewed and are negative.   Per HPI unless specifically indicated above   Allergies as of 10/14/2022   No Known Allergies      Medication List        Accurate as of October 14, 2022 10:54 AM. If you have any questions, ask your nurse or doctor.          allopurinol 300 MG tablet Commonly known as: ZYLOPRIM Take 1/2 (one-half) tablet by mouth once daily What changed:  how much to take how to take this when to take this   ascorbic acid 500 MG tablet Commonly known as: VITAMIN C Take 500 mg by mouth daily.   B-D UF III MINI PEN NEEDLES 31G X 5 MM Misc Generic drug: Insulin Pen Needle 1 each by Does not apply route 4 (four) times daily. Started by: Worthy Rancher, MD   Basaglar KwikPen 100 UNIT/ML Inject 25 Units into the skin daily.   benazepril 40 MG tablet Commonly known as: LOTENSIN Take 1 tablet (40 mg total) by mouth daily.   cloNIDine 0.1 MG tablet Commonly known as: CATAPRES Take 1 tablet (0.1 mg total) by mouth daily.   clopidogrel 75 MG tablet Commonly known as: PLAVIX Take 1 tablet (75 mg total) by mouth daily.   dapagliflozin propanediol 10 MG Tabs tablet Commonly known as: Farxiga Take 1 tablet (10 mg total) by mouth daily before breakfast.   DULoxetine 60 MG capsule Commonly known as: CYMBALTA Take 1 capsule (  60 mg total) by mouth daily. What changed:  medication strength how much to take Changed by: Fransisca Kaufmann Paulla Mcclaskey, MD   Fish Oil 1000 MG Cpdr Take 1 tablet by mouth daily.   fluticasone 0.05 % cream Commonly known as: CUTIVATE Apply 1 application. topically daily.   furosemide 40 MG tablet Commonly known as: LASIX Take 1 tablet (40 mg total) by mouth 2 (two) times daily. What changed: when to take this   gabapentin 300 MG capsule Commonly known as: NEURONTIN Take 1 capsule (300 mg total) by mouth 2 (two) times daily.   insulin regular 100 units/mL  injection Commonly known as: HumuLIN R Inject 0.3 mLs (30 Units total) into the skin 2 (two) times daily before a meal.   Insulin Syringe-Needle U-100 31G X 5/16" 1 ML Misc Commonly known as: RELION INSULIN SYRINGE 1ML/31G USE TO INJECT INSULIN TWICE DAILY AS DIRECTED. DX: E11.65 What changed:  how much to take how to take this when to take this   metoprolol succinate 50 MG 24 hr tablet Commonly known as: TOPROL-XL TAKE 1 TABLET BY MOUTH ONCE DAILY WITH OR IMMEDIATELY FOLLOWING A MEAL What changed:  how much to take how to take this when to take this   MULTIVITAMIN ADULT PO Take 1 tablet by mouth daily.   nitroGLYCERIN 0.4 MG SL tablet Commonly known as: NITROSTAT Place 1 tablet (0.4 mg total) under the tongue every 5 (five) minutes as needed for chest pain.   polyethylene glycol powder 17 GM/SCOOP powder Commonly known as: GLYCOLAX/MIRALAX Take 17 g by mouth 2 (two) times daily as needed. What changed: reasons to take this   rosuvastatin 5 MG tablet Commonly known as: CRESTOR Take 1 tablet (5 mg total) by mouth daily.   Trulicity 3 0000000 Sopn Generic drug: Dulaglutide Inject 3 mg as directed once a week. [name] patient assistance program   VITAMIN B 12 PO Take 1,000 mcg by mouth daily.         Objective:   BP (!) 114/58   Pulse 77   Ht 5' 4"$  (1.626 m)   Wt 216 lb (98 kg)   SpO2 100%   BMI 37.08 kg/m   Wt Readings from Last 3 Encounters:  10/14/22 216 lb (98 kg)  09/15/22 216 lb 0.6 oz (98 kg)  07/27/22 215 lb (97.5 kg)    Physical Exam Vitals and nursing note reviewed.  Constitutional:      General: He is not in acute distress.    Appearance: He is well-developed. He is not diaphoretic.  Eyes:     General: No scleral icterus.       Right eye: No discharge.     Conjunctiva/sclera: Conjunctivae normal.     Pupils: Pupils are equal, round, and reactive to light.  Neck:     Thyroid: No thyromegaly.  Cardiovascular:     Rate and Rhythm:  Normal rate and regular rhythm.     Heart sounds: Normal heart sounds. No murmur heard. Pulmonary:     Effort: Pulmonary effort is normal. No respiratory distress.     Breath sounds: Normal breath sounds. No wheezing.  Musculoskeletal:        General: Normal range of motion.     Cervical back: Neck supple.     Right foot: Normal range of motion. No deformity or bunion.     Left foot: Normal range of motion. Bunion present. No deformity.  Feet:     Right foot:  Skin integrity: Callus (Callus on the lateral aspect of the forepad on both feet) present. No ulcer, blister or skin breakdown.     Toenail Condition: Fungal disease present.    Left foot:     Skin integrity: Callus present. No ulcer, blister or skin breakdown.     Toenail Condition: Fungal disease present. Lymphadenopathy:     Cervical: No cervical adenopathy.  Skin:    General: Skin is warm and dry.     Findings: No rash.  Neurological:     Mental Status: He is alert and oriented to person, place, and time.     Coordination: Coordination normal.  Psychiatric:        Behavior: Behavior normal.       Assessment & Plan:   Problem List Items Addressed This Visit       Cardiovascular and Mediastinum   Chronic systolic heart failure (HCC) (Chronic)   Relevant Medications   benazepril (LOTENSIN) 40 MG tablet   furosemide (LASIX) 40 MG tablet   rosuvastatin (CRESTOR) 5 MG tablet   Essential hypertension   Relevant Medications   benazepril (LOTENSIN) 40 MG tablet   furosemide (LASIX) 40 MG tablet   rosuvastatin (CRESTOR) 5 MG tablet     Endocrine   Type 2 diabetes mellitus with other specified complication (HCC) - Primary   Relevant Medications   benazepril (LOTENSIN) 40 MG tablet   rosuvastatin (CRESTOR) 5 MG tablet   Insulin Pen Needle (B-D UF III MINI PEN NEEDLES) 31G X 5 MM MISC   Other Relevant Orders   CMP14+EGFR   Bayer DCA Hb A1c Waived   CBC with Differential/Platelet   Lipid panel   Diabetic  peripheral neuropathy (HCC)   Relevant Medications   benazepril (LOTENSIN) 40 MG tablet   DULoxetine (CYMBALTA) 60 MG capsule   rosuvastatin (CRESTOR) 5 MG tablet   Insulin Pen Needle (B-D UF III MINI PEN NEEDLES) 31G X 5 MM MISC   Moderate nonproliferative diabetic retinopathy of both eyes without macular edema associated with type 2 diabetes mellitus (HCC)   Relevant Medications   benazepril (LOTENSIN) 40 MG tablet   rosuvastatin (CRESTOR) 5 MG tablet     Genitourinary   CKD (chronic kidney disease) stage 4, GFR 15-29 ml/min (HCC)     Other   Hyperlipidemia (Chronic)   Relevant Medications   benazepril (LOTENSIN) 40 MG tablet   furosemide (LASIX) 40 MG tablet   rosuvastatin (CRESTOR) 5 MG tablet   History of TIA (transient ischemic attack)   Relevant Medications   clopidogrel (PLAVIX) 75 MG tablet  Will increase Cymbalta to see if it helps more with his neuropathic pain in his feet.  Refilled the albuterol as well.  A1c is up slightly at 7.5, focus on diet but it looks like over the past week or 2 he has been focusing more and is running better according to his freestyle libre  Follow up plan: Return in about 3 months (around 01/12/2023), or if symptoms worsen or fail to improve, for Diabetes and hyperlipidemia.  Counseling provided for all of the vaccine components Orders Placed This Encounter  Procedures   CMP14+EGFR   Bayer [place] Hb A1c Waived   CBC with Differential/Platelet   Lipid panel    Caryl Pina, MD [facility] 10/14/2022, 10:54 AM

## 2022-10-20 DIAGNOSIS — N184 Chronic kidney disease, stage 4 (severe): Secondary | ICD-10-CM | POA: Diagnosis not present

## 2022-10-21 ENCOUNTER — Ambulatory Visit (INDEPENDENT_AMBULATORY_CARE_PROVIDER_SITE_OTHER): Payer: Medicare Other

## 2022-10-21 DIAGNOSIS — I255 Ischemic cardiomyopathy: Secondary | ICD-10-CM | POA: Diagnosis not present

## 2022-10-21 LAB — CUP PACEART REMOTE DEVICE CHECK
Battery Remaining Longevity: 43 mo
Battery Voltage: 2.97 V
Brady Statistic AP VP Percent: 0.08 %
Brady Statistic AP VS Percent: 74.52 %
Brady Statistic AS VP Percent: 0.02 %
Brady Statistic AS VS Percent: 25.38 %
Brady Statistic RA Percent Paced: 74.6 %
Brady Statistic RV Percent Paced: 0.09 %
Date Time Interrogation Session: 20240221082706
HighPow Impedance: 53 Ohm
HighPow Impedance: 75 Ohm
Implantable Lead Connection Status: 753985
Implantable Lead Connection Status: 753985
Implantable Lead Implant Date: 20030519
Implantable Lead Implant Date: 20030519
Implantable Lead Location: 753859
Implantable Lead Location: 753860
Implantable Lead Model: 158
Implantable Lead Model: 4087
Implantable Lead Serial Number: 115102
Implantable Lead Serial Number: 159999
Implantable Pulse Generator Implant Date: 20171114
Lead Channel Impedance Value: 4047 Ohm
Lead Channel Impedance Value: 4047 Ohm
Lead Channel Impedance Value: 4047 Ohm
Lead Channel Impedance Value: 437 Ohm
Lead Channel Impedance Value: 456 Ohm
Lead Channel Impedance Value: 494 Ohm
Lead Channel Pacing Threshold Amplitude: 0.875 V
Lead Channel Pacing Threshold Amplitude: 1.25 V
Lead Channel Pacing Threshold Pulse Width: 0.4 ms
Lead Channel Pacing Threshold Pulse Width: 0.4 ms
Lead Channel Sensing Intrinsic Amplitude: 13.875 mV
Lead Channel Sensing Intrinsic Amplitude: 13.875 mV
Lead Channel Sensing Intrinsic Amplitude: 2 mV
Lead Channel Sensing Intrinsic Amplitude: 2 mV
Lead Channel Setting Pacing Amplitude: 2.5 V
Lead Channel Setting Pacing Amplitude: 2.5 V
Lead Channel Setting Pacing Pulse Width: 0.4 ms
Lead Channel Setting Sensing Sensitivity: 0.3 mV
Zone Setting Status: 755011

## 2022-11-03 ENCOUNTER — Encounter: Payer: Self-pay | Admitting: Internal Medicine

## 2022-11-03 ENCOUNTER — Ambulatory Visit: Payer: Medicare Other | Attending: Internal Medicine | Admitting: Internal Medicine

## 2022-11-03 VITALS — BP 114/66 | HR 68 | Ht 64.0 in | Wt 217.4 lb

## 2022-11-03 DIAGNOSIS — I5022 Chronic systolic (congestive) heart failure: Secondary | ICD-10-CM

## 2022-11-03 DIAGNOSIS — I4729 Other ventricular tachycardia: Secondary | ICD-10-CM | POA: Diagnosis not present

## 2022-11-03 DIAGNOSIS — Z95810 Presence of automatic (implantable) cardiac defibrillator: Secondary | ICD-10-CM

## 2022-11-03 NOTE — Progress Notes (Signed)
HPI Manuel Kline returns today for followup. He is a pleasant 89 yo man with a h/o HTN, CAD, s/p CABG, OSA, VT s/p ICD insertion. In the interim, he has been stable but does note some problems with peripheral neuropathy. No chest pain. No edema. He was in the hospital and had intestinal pneumotosis which resolved with conservative measures. He has not had any ICD therapies.  No Known Allergies   Current Outpatient Medications  Medication Sig Dispense Refill   allopurinol (ZYLOPRIM) 300 MG tablet Take 1/2 (one-half) tablet by mouth once daily 45 tablet 3   benazepril (LOTENSIN) 40 MG tablet Take 1 tablet (40 mg total) by mouth daily. 90 tablet 3   clopidogrel (PLAVIX) 75 MG tablet Take 1 tablet (75 mg total) by mouth daily. 90 tablet 3   Cyanocobalamin (VITAMIN B 12 PO) Take 1,000 mcg by mouth daily.     dapagliflozin propanediol (FARXIGA) 10 MG TABS tablet Take 1 tablet (10 mg total) by mouth daily before breakfast. 90 tablet 5   Dulaglutide (TRULICITY) 3 0000000 SOPN Inject 3 mg as directed once a week. [name] patient assistance program 6 mL 11   DULoxetine (CYMBALTA) 60 MG capsule Take 1 capsule (60 mg total) by mouth daily. 90 capsule 3   fluticasone (CUTIVATE) 0.05 % cream Apply 1 application. topically daily.     furosemide (LASIX) 40 MG tablet Take 1 tablet (40 mg total) by mouth 2 (two) times daily. 180 tablet 3   gabapentin (NEURONTIN) 300 MG capsule Take 1 capsule (300 mg total) by mouth 2 (two) times daily. 180 capsule 1   Insulin Glargine (BASAGLAR KWIKPEN) 100 UNIT/ML Inject 25 Units into the skin daily. 60 mL 5   Insulin Pen Needle (B-D UF III MINI PEN NEEDLES) 31G X 5 MM MISC 1 each by Does not apply route 4 (four) times daily. 120 each 11   insulin regular (HUMULIN R) 100 units/mL injection Inject 0.3 mLs (30 Units total) into the skin 2 (two) times daily before a meal. 60 mL 3   Insulin Syringe-Needle U-100 (RELION INSULIN SYRINGE 1ML/31G) 31G X 5/16" 1 ML MISC USE TO  INJECT INSULIN TWICE DAILY AS DIRECTED. DX: E11.65 (Patient taking differently: 1 each by Other route See admin instructions. USE TO INJECT INSULIN TWICE DAILY AS DIRECTED. DX: E11.65) 200 each 11   metoprolol succinate (TOPROL-XL) 50 MG 24 hr tablet TAKE 1 TABLET BY MOUTH ONCE DAILY WITH OR IMMEDIATELY FOLLOWING A MEAL (Patient taking differently: Take 50 mg by mouth daily. TAKE 1 TABLET BY MOUTH ONCE DAILY WITH OR IMMEDIATELY FOLLOWING A MEAL) 90 tablet 3   Multiple Vitamin (MULTIVITAMIN ADULT PO) Take 1 tablet by mouth daily.     nitroGLYCERIN (NITROSTAT) 0.4 MG SL tablet Place 1 tablet (0.4 mg total) under the tongue every 5 (five) minutes as needed for chest pain. 25 tablet 0   Omega-3 Fatty Acids (FISH OIL) 1000 MG CPDR Take 1 tablet by mouth daily.     polyethylene glycol powder (GLYCOLAX/MIRALAX) powder Take 17 g by mouth 2 (two) times daily as needed. (Patient taking differently: Take 17 g by mouth 2 (two) times daily as needed for mild constipation or moderate constipation.) 3350 g 1   rosuvastatin (CRESTOR) 5 MG tablet Take 1 tablet (5 mg total) by mouth daily. 90 tablet 3   vitamin C (ASCORBIC ACID) 500 MG tablet Take 500 mg by mouth daily.     No current facility-administered medications for this  visit.     Past Medical History:  Diagnosis Date   Adenomatous colon polyp 2006   CAD (coronary artery disease)    Calcium oxalate renal stones    Cardiomyopathy    Cataract    Cataract    Cerebral embolism with cerebral infarction [id number]   Chronic systolic heart failure (HCC)         CKD (chronic kidney disease) stage 4, GFR 15-29 ml/min ([place]) 11/08/2017   Claudication in peripheral vascular disease ([place]) 07/23/2020   Diabetes ([place])    Diabetic peripheral neuropathy ([place]) 01/31/2015   Dual implantable cardioverter-defibrillator in situ    01/16/2002 Dr. Lovena Le  RA lead  Guidant [id number] RV lead  Guidant [id number] Generator  Guidant Prism  09/02/2009 Generator change Medtronic  [id number]  SN  [id number]      Erectile dysfunction    Erosive esophagitis    Essential hypertension 11/30/2015   Hemorrhoids    History of TIA (transient ischemic attack) 01/19/2019   HLD (hyperlipidemia)    HTN (hypertension)    Hyperlipidemia    Hypertensive heart disease without CHF 07/31/2011   ICD (implantable cardiac defibrillator) in place    ICD dual chamber in situ    Ischemic cardiomyopathy    EF 40% June 2013    Left-sided weakness [id number]   Metabolic syndrome    Moderate nonproliferative diabetic retinopathy of both eyes without macular edema associated with type 2 diabetes mellitus ([place]) 02/20/2020   Morbid obesity ([place])    OSA on CPAP 11/05/2014   Osteoarthritis    Posterior vitreous detachment of both eyes 02/20/2020   Presence of automatic (implantable) cardiac defibrillator 11/08/2017   S/P CABG (coronary artery bypass graft) 11/02/2000   Sleep apnea    Stroke-like symptoms 12/26/2018   Syncope 08/06/2009   Qualifier: Diagnosis of  By: Selena Batten CMA, [place]     Type 2 diabetes, uncontrolled, with neuropathy    Has retinopahty and neuropathy     Ventricular tachycardia (paroxysmal) ([place]) 07/14/2016    ROS:   All systems reviewed and negative except as noted in the HPI.   Past Surgical History:  Procedure Laterality Date   ABDOMINAL EXPLORATION SURGERY     BACK SURGERY     X'3   cardiac bypass     CARDIAC DEFIBRILLATOR PLACEMENT     CARPAL TUNNEL RELEASE     X2, bilateral   CATARACT EXTRACTION     COLONOSCOPY  06/20/2012   Procedure: COLONOSCOPY;  Surgeon: Sable Feil, MD;  Location: [facility];  Service: Endoscopy;  Laterality: N/A;   DOPPLER ECHOCARDIOGRAPHY  2003   EP IMPLANTABLE DEVICE N/A 07/14/2016   Procedure: ICD Generator Changeout;  Surgeon: Evans Lance, MD;  Location: [facility];  Service: Cardiovascular;  Laterality: N/A;   ESOPHAGOGASTRODUODENOSCOPY  06/20/2012   Procedure: ESOPHAGOGASTRODUODENOSCOPY (EGD);  Surgeon: Sable Feil, MD;  Location: [facility];  Service: Endoscopy;  Laterality: N/A;   EYE SURGERY     LAPAROTOMY     RETINOPATHY SURGERY Bilateral    rotator cuff surgery     left     Family History  Problem Relation Age of Onset   Diabetes Brother    Heart disease Father    Heart disease Mother    Diabetes Mother    Diabetes Brother    Diabetes Brother    Cancer Brother        baldder   Heart disease Brother    Diabetes Sister  Diabetes Sister    Kidney disease Sister        dialysis   Diabetes Sister    Diabetes Sister    Diabetes Sister    Throat cancer Paternal Uncle      Social History   Socioeconomic History   Marital status: Married    Spouse name: Manuel Kline   Number of children: 4   Years of education: 11   Highest education level: 11th grade  Occupational History   Occupation: Retired from [employer]    Employer: RETIRED  Tobacco Use   Smoking status: Never   Smokeless tobacco: Never  Scientific laboratory technician Use: Never used  Substance and Sexual Activity   Alcohol use: No    Alcohol/week: 0.0 standard drinks of alcohol   Drug use: No   Sexual activity: Yes  Other Topics Concern   Not on file  Social History Narrative   Lives at home with wife   Right handed, Married, 4 kids from previous marriage.  Retired.     Caffeine 1 cup daily.   Social Determinants of Health   Financial Resource Strain: Low Risk  (07/20/2022)   Overall Financial Resource Strain (CARDIA)    Difficulty of Paying Living Expenses: Not hard at all  Food Insecurity: No Food Insecurity (07/20/2022)   Hunger Vital Sign    Worried About Running Out of Food in the Last Year: Never true    Ran Out of Food in the Last Year: Never true  Transportation Needs: No Transportation Needs (07/20/2022)   PRAPARE - Hydrologist (Medical): No    Lack of Transportation (Non-Medical): No  Physical Activity: Insufficiently Active (12/10/2021)   Exercise Vital Sign     Days of Exercise per Week: 5 days    Minutes of Exercise per Session: 20 min  Stress: No Stress Concern Present (05/20/2022)   [place]    Feeling of Stress : Not at all  Social Connections: [place] (05/06/2022)   Social Connection and Isolation Panel [NHANES]    Frequency of Communication with Friends and Family: More than three times a week    Frequency of Social Gatherings with Friends and Family: More than three times a week    Attends Religious Services: More than 4 times per year    Active Member of [employer] or Organizations: Yes    Attends Music therapist: More than 4 times per year    Marital Status: Married  Human resources officer Violence: Not At Risk (07/20/2022)   Humiliation, Afraid, Rape, and Kick questionnaire    Fear of Current or Ex-Partner: No    Emotionally Abused: No    Physically Abused: No    Sexually Abused: No     BP 114/66   Pulse 68   Ht '5\' 4"'$  (1.626 m)   Wt 217 lb 6.4 oz (98.6 kg)   SpO2 94%   BMI 37.32 kg/m   Physical Exam:  Well appearing NAD HEENT: Unremarkable Neck:  No JVD, no thyromegally Lymphatics:  No adenopathy Back:  No CVA tenderness Lungs:  Clear HEART:  Regular rate rhythm, no murmurs, no rubs, no clicks Abd:  soft, positive bowel sounds, no organomegally, no rebound, no guarding Ext:  2 plus pulses, no edema, no cyanosis, no clubbing Skin:  No rashes no nodules Neuro:  CN II through XII intact, motor grossly intact  EKG - nsr with LBBB  DEVICE  Normal device function.  See PaceArt for details.   Assess/Plan:  VT - he has had no additional VT. He will undergo watchful waiting. No change in his meds. CAD - he denies anginal symptoms. Continue current meds. Obesity - I encouraged him to avoid sweets. His daughter notes he has lots of pies and cakes. Chronic systolic heart failure - his symptoms remain class 2. I encouraged him to walk more  despite his neuropathy. His echo EF was improved.   Carleene Overlie Zyren Sevigny,MD

## 2022-11-03 NOTE — Patient Instructions (Signed)
Medication Instructions:  Your physician recommends that you continue on your current medications as directed. Please refer to the Current Medication list given to you today.  *If you need a refill on your cardiac medications before your next appointment, please call your pharmacy*  Lab Work: None ordered.  If you have labs (blood work) drawn today and your tests are completely normal, you will receive your results only by: [place] (if you have MyChart) OR A paper copy in the mail If you have any lab test that is abnormal or we need to change your treatment, we will call you to review the results.  Testing/Procedures: None ordered.  Follow-Up: At [facility], you and your health needs are our priority.  As part of our continuing mission to provide you with exceptional heart care, we have created designated Provider Care Teams.  These Care Teams include your primary Cardiologist (physician) and Advanced Practice Providers (APPs -  Physician Assistants and Nurse Practitioners) who all work together to provide you with the care you need, when you need it.  We recommend signing up for the patient portal called "MyChart".  Sign up information is provided on this After Visit Summary.  MyChart is used to connect with patients for Virtual Visits (Telemedicine).  Patients are able to view lab/test results, encounter notes, upcoming appointments, etc.  Non-urgent messages can be sent to your provider as well.   To learn more about what you can do with MyChart, go to [email or web address].    Your next appointment:   1 year(s)  The format for your next appointment:   In Person  Provider:   Cristopher Peru, MD{or one of the following Advanced Practice Providers on your designated Care Team:   Tommye Standard, [place] Legrand Como "Jonni Sanger" Chalmers Cater, [place]  Remote monitoring is used to monitor your ICD from home. This monitoring reduces the number of office visits required to check your device to one  time per year. It allows [place] to keep an eye on the functioning of your device to ensure it is working properly. You are scheduled for a device check from home on 01/20/2023. You may send your transmission at any time that day. If you have a wireless device, the transmission will be sent automatically. After your physician reviews your transmission, you will receive a postcard with your next transmission date.

## 2022-11-04 ENCOUNTER — Ambulatory Visit: Payer: Medicare Other | Admitting: Podiatry

## 2022-11-04 DIAGNOSIS — M79675 Pain in left toe(s): Secondary | ICD-10-CM

## 2022-11-04 DIAGNOSIS — E0843 Diabetes mellitus due to underlying condition with diabetic autonomic (poly)neuropathy: Secondary | ICD-10-CM

## 2022-11-04 DIAGNOSIS — I739 Peripheral vascular disease, unspecified: Secondary | ICD-10-CM | POA: Diagnosis not present

## 2022-11-04 DIAGNOSIS — B351 Tinea unguium: Secondary | ICD-10-CM

## 2022-11-04 DIAGNOSIS — Q828 Other specified congenital malformations of skin: Secondary | ICD-10-CM | POA: Insufficient documentation

## 2022-11-04 DIAGNOSIS — M79674 Pain in right toe(s): Secondary | ICD-10-CM

## 2022-11-04 NOTE — Progress Notes (Signed)
This patient returns to my office for at risk foot care.  This patient requires this care by a professional since this patient will be at risk due to having diabetic neuropathy claudication in PVD and CKD.  This patient is unable to cut nails himself since the patient cannot reach his nails.These nails are painful walking and wearing shoes. Patient presents to the office with his daughter.    This patient presents for at risk foot care today.  General Appearance  Alert, conversant and in no acute stress.  Vascular  Dorsalis pedis and posterior tibial  pulses are weakly  palpable  bilaterally.  Capillary return is within normal limits  bilaterally  Cold feet  bilaterally.  Absent digital hair noted.  Neurologic  Senn-Weinstein monofilament wire test diminished  bilaterally. Muscle power within normal limits bilaterally.  Nails Thick disfigured discolored nails with subungual debris  from hallux to fifth toes bilaterally. No evidence of bacterial infection or drainage bilaterally.  Orthopedic  No limitations of motion  feet .  No crepitus or effusions noted.  No bony pathology or digital deformities noted.  Plantar flexed fifth metatarsal head left foot.  HAV  left  foot with increased temperature and swelling.  Skin  normotropic skin with no porokeratosis noted bilaterally.  No signs of infections or ulcers noted.   Porokeratosis sub 5th met left foot.  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  Consent was obtained for treatment procedures.   Mechanical debridement of nails 1-5  bilaterally performed with a nail nipper.  Filed with dremel without incident. Discussed bunion with daughter and patient.  To refer to medical podiatrists for evaluation.     Return office visit  3 months                    Told patient to return for periodic foot care and evaluation due to potential at risk complications.   Gardiner Barefoot DPM

## 2022-11-06 ENCOUNTER — Telehealth: Payer: Self-pay | Admitting: Cardiology

## 2022-11-06 ENCOUNTER — Telehealth: Payer: Self-pay

## 2022-11-06 NOTE — Telephone Encounter (Signed)
-----   Message from Park Liter, MD sent at 10/06/2022  5:49 PM EST ----- Echocardiogram showed normal left ventricle ejection fraction, moderate left ventricle hypertrophy, mild enlargement of the aorta none of this required any acute intervention right now

## 2022-11-06 NOTE — Telephone Encounter (Signed)
-----   Message from Robert J Krasowski, MD sent at 10/06/2022  5:49 PM EST ----- Echocardiogram showed normal left ventricle ejection fraction, moderate left ventricle hypertrophy, mild enlargement of the aorta none of this required any acute intervention right now 

## 2022-11-06 NOTE — Telephone Encounter (Signed)
Son returned CMA's call.  Son also stated patient had a visit with his doctor this week and he went over results of his test.

## 2022-11-06 NOTE — Telephone Encounter (Signed)
Patient notified of results.

## 2022-11-13 ENCOUNTER — Other Ambulatory Visit: Payer: Medicare Other

## 2022-11-16 DIAGNOSIS — G4733 Obstructive sleep apnea (adult) (pediatric): Secondary | ICD-10-CM | POA: Diagnosis not present

## 2022-11-17 ENCOUNTER — Other Ambulatory Visit: Payer: Self-pay | Admitting: Family Medicine

## 2022-11-17 ENCOUNTER — Ambulatory Visit (INDEPENDENT_AMBULATORY_CARE_PROVIDER_SITE_OTHER): Payer: Medicare Other

## 2022-11-17 DIAGNOSIS — E0843 Diabetes mellitus due to underlying condition with diabetic autonomic (poly)neuropathy: Secondary | ICD-10-CM

## 2022-11-17 DIAGNOSIS — L84 Corns and callosities: Secondary | ICD-10-CM

## 2022-11-17 DIAGNOSIS — E1142 Type 2 diabetes mellitus with diabetic polyneuropathy: Secondary | ICD-10-CM

## 2022-11-17 NOTE — Progress Notes (Signed)
Patient presents to the office today with issues concerning their custom inserts. Patient states that the callus on his foot rubs the insert and causes pain. With the grinder I created an unload to prevent friction and pain.   Patient will call back with any other concerns.

## 2022-11-19 DIAGNOSIS — L538 Other specified erythematous conditions: Secondary | ICD-10-CM | POA: Diagnosis not present

## 2022-11-19 DIAGNOSIS — L821 Other seborrheic keratosis: Secondary | ICD-10-CM | POA: Diagnosis not present

## 2022-11-19 DIAGNOSIS — L820 Inflamed seborrheic keratosis: Secondary | ICD-10-CM | POA: Diagnosis not present

## 2022-11-19 DIAGNOSIS — Z08 Encounter for follow-up examination after completed treatment for malignant neoplasm: Secondary | ICD-10-CM | POA: Diagnosis not present

## 2022-11-19 DIAGNOSIS — Z85828 Personal history of other malignant neoplasm of skin: Secondary | ICD-10-CM | POA: Diagnosis not present

## 2022-11-19 DIAGNOSIS — L814 Other melanin hyperpigmentation: Secondary | ICD-10-CM | POA: Diagnosis not present

## 2022-11-19 DIAGNOSIS — L281 Prurigo nodularis: Secondary | ICD-10-CM | POA: Diagnosis not present

## 2022-11-19 DIAGNOSIS — L570 Actinic keratosis: Secondary | ICD-10-CM | POA: Diagnosis not present

## 2022-11-19 NOTE — Progress Notes (Signed)
Remote ICD transmission.   

## 2022-11-30 DIAGNOSIS — E1165 Type 2 diabetes mellitus with hyperglycemia: Secondary | ICD-10-CM | POA: Diagnosis not present

## 2022-12-14 ENCOUNTER — Ambulatory Visit (INDEPENDENT_AMBULATORY_CARE_PROVIDER_SITE_OTHER): Payer: Medicare Other

## 2022-12-14 VITALS — Ht 64.0 in | Wt 214.0 lb

## 2022-12-14 DIAGNOSIS — Z0000 Encounter for general adult medical examination without abnormal findings: Secondary | ICD-10-CM

## 2022-12-14 NOTE — Progress Notes (Signed)
Subjective:   Manuel Kline is a 89 y.o. male who presents for Medicare Annual/Subsequent preventive examination. I connected with  Clydie Braun Altmann on 12/14/22 by a audio enabled telemedicine application and verified that I am speaking with the correct person using two identifiers.  Patient Location: Home  Provider Location: Home Office  I discussed the limitations of evaluation and management by telemedicine. The patient expressed understanding and agreed to proceed.  Review of Systems     Cardiac Risk Factors include: advanced age (>79men, >30 women);diabetes mellitus;male gender;hypertension;dyslipidemia     Objective:    Today's Vitals   12/14/22 0855  Weight: 214 lb (97.1 kg)  Height:  (1.626 m)   Body mass index is 36.73 kg/m.     12/14/2022    8:59 AM 07/21/2022    8:07 PM 12/10/2021    9:24 AM 12/09/2020    9:04 AM 10/24/2019   11:25 AM 04/20/2019    9:55 AM 03/27/2019   11:03 AM  Advanced Directives  Does Patient Have a Medical Advance Directive? Yes No No Yes Yes Yes Yes  Type of Estate agent of [place];Living will    Healthcare Power of [place];Living will Healthcare Power of [name];Living will Living will;Healthcare Power of Attorney  Does patient want to make changes to medical advance directive?    Yes (MAU/Ambulatory/Procedural Areas - Information given) No - Patient declined No - Patient declined No - Patient declined  Copy of Healthcare Power of Attorney in Chart? No - copy requested      No - copy requested  Would patient like information on creating a medical advance directive?   Yes (MAU/Ambulatory/Procedural Areas - Information given)        Current Medications (verified) Outpatient Encounter Medications as of 12/14/2022  Medication Sig   allopurinol (ZYLOPRIM) 300 MG tablet Take 1/2 (one-half) tablet by mouth once daily   benazepril (LOTENSIN) 40 MG tablet Take 1 tablet (40 mg total) by mouth daily.   clopidogrel (PLAVIX) 75 MG  tablet Take 1 tablet (75 mg total) by mouth daily.   Cyanocobalamin (VITAMIN B 12 PO) Take 1,000 mcg by mouth daily.   dapagliflozin propanediol (FARXIGA) 10 MG TABS tablet Take 1 tablet (10 mg total) by mouth daily before breakfast.   Dulaglutide (TRULICITY) 3 MG/0.5ML SOPN Inject 3 mg as directed once a week. [name] patient assistance program   DULoxetine (CYMBALTA) 60 MG capsule Take 1 capsule (60 mg total) by mouth daily.   fluticasone (CUTIVATE) 0.05 % cream Apply 1 application. topically daily.   furosemide (LASIX) 40 MG tablet Take 1 tablet (40 mg total) by mouth 2 (two) times daily.   gabapentin (NEURONTIN) 300 MG capsule Take 1 capsule by mouth twice daily   Insulin Glargine (BASAGLAR KWIKPEN) 100 UNIT/ML Inject 25 Units into the skin daily.   Insulin Pen Needle (B-D UF III MINI PEN NEEDLES) 31G X 5 MM MISC 1 each by Does not apply route 4 (four) times daily.   insulin regular (HUMULIN R) 100 units/mL injection Inject 0.3 mLs (30 Units total) into the skin 2 (two) times daily before a meal.   Insulin Syringe-Needle U-100 (RELION INSULIN SYRINGE 1ML/31G) 31G X 5/16" 1 ML MISC USE TO INJECT INSULIN TWICE DAILY AS DIRECTED. DX: E11.65 (Patient taking differently: 1 each by Other route See admin instructions. USE TO INJECT INSULIN TWICE DAILY AS DIRECTED. DX: E11.65)   metoprolol succinate (TOPROL-XL) 50 MG 24 hr tablet TAKE 1 TABLET BY MOUTH  ONCE DAILY WITH OR IMMEDIATELY FOLLOWING A MEAL (Patient taking differently: Take 50 mg by mouth daily. TAKE 1 TABLET BY MOUTH ONCE DAILY WITH OR IMMEDIATELY FOLLOWING A MEAL)   Multiple Vitamin (MULTIVITAMIN ADULT PO) Take 1 tablet by mouth daily.   nitroGLYCERIN (NITROSTAT) 0.4 MG SL tablet Place 1 tablet (0.4 mg total) under the tongue every 5 (five) minutes as needed for chest pain.   Omega-3 Fatty Acids (FISH OIL) 1000 MG CPDR Take 1 tablet by mouth daily.   polyethylene glycol powder (GLYCOLAX/MIRALAX) powder Take 17 g by mouth 2 (two) times  daily as needed. (Patient taking differently: Take 17 g by mouth 2 (two) times daily as needed for mild constipation or moderate constipation.)   rosuvastatin (CRESTOR) 5 MG tablet Take 1 tablet (5 mg total) by mouth daily.   vitamin C (ASCORBIC ACID) 500 MG tablet Take 500 mg by mouth daily.   No facility-administered encounter medications on file as of 12/14/2022.    Allergies (verified) Patient has no known allergies.   History: Past Medical History:  Diagnosis Date   Adenomatous colon polyp 2006   CAD (coronary artery disease)    Calcium oxalate renal stones    Cardiomyopathy    Cataract    Cataract    Cerebral embolism with cerebral infarction 12/26/2018   Chronic systolic heart failure         CKD (chronic kidney disease) stage 4, GFR 15-29 ml/min 11/08/2017   Claudication in peripheral vascular disease 07/23/2020   Diabetes    Diabetic peripheral neuropathy 01/31/2015   Dual implantable cardioverter-defibrillator in situ    01/16/2002 Dr. Ladona Ridgel  RA lead  Guidant [id number] RV lead  Guidant [id number] Generator  Guidant Prism  09/02/2009 Generator change Medtronic [id number]  SN  [id number]      Erectile dysfunction    Erosive esophagitis    Essential hypertension 11/30/2015   Hemorrhoids    History of TIA (transient ischemic attack) 01/19/2019   HLD (hyperlipidemia)    HTN (hypertension)    Hyperlipidemia    Hypertensive heart disease without CHF 07/31/2011   ICD (implantable cardiac defibrillator) in place    ICD dual chamber in situ    Ischemic cardiomyopathy    EF 40% June 2013    Left-sided weakness 12/25/2018   Metabolic syndrome    Moderate nonproliferative diabetic retinopathy of both eyes without macular edema associated with type 2 diabetes mellitus 02/20/2020   Morbid obesity    OSA on CPAP 11/05/2014   Osteoarthritis    Posterior vitreous detachment of both eyes 02/20/2020   Presence of automatic (implantable) cardiac defibrillator 11/08/2017   S/P CABG (coronary  artery bypass graft) 11/02/2000   Sleep apnea    Stroke-like symptoms 12/26/2018   Syncope 08/06/2009   Qualifier: Diagnosis of  By: Susette Racer CMA, [place]     Type 2 diabetes, uncontrolled, with neuropathy    Has retinopahty and neuropathy     Ventricular tachycardia (paroxysmal) 07/14/2016   Past Surgical History:  Procedure Laterality Date   ABDOMINAL EXPLORATION SURGERY     BACK SURGERY     X'3   cardiac bypass     CARDIAC DEFIBRILLATOR PLACEMENT     CARPAL TUNNEL RELEASE     X2, bilateral   CATARACT EXTRACTION     COLONOSCOPY  06/20/2012   Procedure: COLONOSCOPY;  Surgeon: Mardella Layman, MD;  Location: [facility];  Service: Endoscopy;  Laterality: N/A;   DOPPLER ECHOCARDIOGRAPHY  2003   EP IMPLANTABLE  DEVICE N/A 07/14/2016   Procedure: ICD Generator Changeout;  Surgeon: Marinus Maw, MD;  Location: [facility] INVASIVE CV LAB;  Service: Cardiovascular;  Laterality: N/A;   ESOPHAGOGASTRODUODENOSCOPY  06/20/2012   Procedure: ESOPHAGOGASTRODUODENOSCOPY (EGD);  Surgeon: Mardella Layman, MD;  Location: [facility];  Service: Endoscopy;  Laterality: N/A;   EYE SURGERY     LAPAROTOMY     RETINOPATHY SURGERY Bilateral    rotator cuff surgery     left   Family History  Problem Relation Age of Onset   Diabetes Brother    Heart disease Father    Heart disease Mother    Diabetes Mother    Diabetes Brother    Diabetes Brother    Cancer Brother        baldder   Heart disease Brother    Diabetes Sister    Diabetes Sister    Kidney disease Sister        dialysis   Diabetes Sister    Diabetes Sister    Diabetes Sister    Throat cancer Paternal Uncle    Social History   Socioeconomic History   Marital status: Married    Spouse name: Alice   Number of children: 4   Years of education: 11   Highest education level: 11th grade  Occupational History   Occupation: Retired from [employer] business    Employer: RETIRED  Tobacco Use   Smoking status: Never   Smokeless tobacco: Never   Building services engineer Use: Never used  Substance and Sexual Activity   Alcohol use: No    Alcohol/week: 0.0 standard drinks of alcohol   Drug use: No   Sexual activity: Yes  Other Topics Concern   Not on file  Social History Narrative   Lives at home with wife   Right handed, Married, 4 kids from previous marriage.  Retired.     Caffeine 1 cup daily.   Social Determinants of Health   Financial Resource Strain: Low Risk  (12/14/2022)   Overall Financial Resource Strain (CARDIA)    Difficulty of Paying Living Expenses: Not hard at all  Food Insecurity: No Food Insecurity (12/14/2022)   Hunger Vital Sign    Worried About Running Out of Food in the Last Year: Never true    Ran Out of Food in the Last Year: Never true  Transportation Needs: No Transportation Needs (12/14/2022)   PRAPARE - Administrator, Civil Service (Medical): No    Lack of Transportation (Non-Medical): No  Physical Activity: Insufficiently Active (12/14/2022)   Exercise Vital Sign    Days of Exercise per Week: 3 days    Minutes of Exercise per Session: 10 min  Stress: No Stress Concern Present (12/14/2022)   [employer] of Occupational Health - Occupational Stress Questionnaire    Feeling of Stress : Not at all  Social Connections: Moderately Integrated (12/14/2022)   Social Connection and Isolation Panel [NHANES]    Frequency of Communication with Friends and Family: More than three times a week    Frequency of Social Gatherings with Friends and Family: More than three times a week    Attends Religious Services: More than 4 times per year    Active Member of [employer] or Organizations: No    Attends Banker Meetings: Never    Marital Status: Married    Tobacco Counseling Counseling given: Not Answered   Clinical Intake:  Pre-visit preparation completed: Yes  Pain : No/denies pain  Nutritional Risks: None Diabetes: Yes CBG done?: No Did pt. bring in CBG monitor from  home?: No  How often do you need to have someone help you when you read instructions, pamphlets, or other written materials from your doctor or pharmacy?: 1 - Never  Diabetic?yes  Nutrition Risk Assessment:  Has the patient had any N/V/D within the last 2 months?  No  Does the patient have any non-healing wounds?  No  Has the patient had any unintentional weight loss or weight gain?  No   Diabetes:  Is the patient diabetic?  Yes  If diabetic, was a CBG obtained today?  No  Did the patient bring in their glucometer from home?  No  How often do you monitor your CBG's? Libre .   Financial Strains and Diabetes Management:  Are you having any financial strains with the device, your supplies or your medication? No .  Does the patient want to be seen by Chronic Care Management for management of their diabetes?  No  Would the patient like to be referred to a Nutritionist or for Diabetic Management?  No   Diabetic Exams:  Diabetic Eye Exam: Completed 05/2022 Diabetic Foot Exam: Overdue, Pt has been advised about the importance in completing this exam. Pt is scheduled for diabetic foot exam on next office visit .   Interpreter Needed?: No  Information entered by :: Renie Ora, LPN   Activities of Daily Living    12/14/2022    8:59 AM 05/06/2022    6:18 PM  In your present state of health, do you have any difficulty performing the following activities:  Hearing? 0 1  Comment  keeps tv very loud  Vision? 0 0  Difficulty concentrating or making decisions? 0 0  Walking or climbing stairs? 0 1  Dressing or bathing? 0 0  Doing errands, shopping? 0 1  Preparing Food and eating ? N   Using the Toilet? N   In the past six months, have you accidently leaked urine? N   Do you have problems with loss of bowel control? N   Managing your Medications? N   Managing your Finances? N   Housekeeping or managing your Housekeeping? N     Patient Care Team: Dettinger, Elige Radon, MD as PCP -  General (Family Medicine) Marinus Maw, MD as PCP - Electrophysiology (Cardiology) Georgeanna Lea, MD as PCP - Cardiology (Cardiology) Kathleene Hazel, MD as Consulting Physician (Internal Medicine) Charlsie Merles Kirstie Peri, DPM as Consulting Physician (Podiatry) Luciana Axe Alford Highland, MD as Consulting Physician (Ophthalmology) Marinus Maw, MD as Consulting Physician (Cardiology) Helane Gunther, DPM as Consulting Physician (Podiatry) Danella Maiers, [facility] (Pharmacist) Dohmeier, Porfirio Mylar, MD as Consulting Physician (Neurology) Clinton Gallant, RN as [facility] Care Management  Indicate any recent Medical Services you may have received from other than [facility] providers in the past year (date may be approximate).     Assessment:   This is a routine wellness examination for Manuel Kline.  Hearing/Vision screen Vision Screening - Comments:: Wears rx glasses - up to date with routine eye exams with  Dr.Rankin   Dietary issues and exercise activities discussed: Current Exercise Habits: The patient does not participate in regular exercise at present, Exercise limited by: orthopedic condition(s)   Goals Addressed             This Visit's Progress    DIET - INCREASE WATER INTAKE   On track    Try to drink 6-8  glasses of water daily.       Depression Screen    12/14/2022    8:58 AM 10/14/2022   10:19 AM 07/27/2022    2:17 PM 07/13/2022   12:58 PM 05/20/2022   10:02 AM 05/06/2022    6:16 PM 04/09/2022    1:01 PM  PHQ 2/9 Scores  PHQ - 2 Score 0 0 0 0 0 0 0  PHQ- 9 Score   0 0       Fall Risk    12/14/2022    8:57 AM 10/14/2022   10:19 AM 07/27/2022    2:15 PM 07/20/2022   10:51 AM 07/13/2022    1:07 PM  Fall Risk   Falls in the past year? 1 0 1 1 1   Number falls in past yr: 1  0 0 0  Injury with Fall? 1  0 0 0  Risk for fall due to : History of fall(s);Impaired balance/gait;Orthopedic patient  Impaired balance/gait Impaired balance/gait Impaired balance/gait  Follow up  Education provided;Falls prevention discussed  Falls evaluation completed Falls evaluation completed Falls evaluation completed    FALL RISK PREVENTION PERTAINING TO THE HOME:  Any stairs in or around the home? No  If so, are there any without handrails? No  Home free of loose throw rugs in walkways, pet beds, electrical cords, etc? Yes  Adequate lighting in your home to reduce risk of falls? Yes   ASSISTIVE DEVICES UTILIZED TO PREVENT FALLS:  Life alert? No  Use of a cane, walker or w/c? Yes  Grab bars in the bathroom? No  Shower chair or bench in shower? No  Elevated toilet seat or a handicapped toilet? No       03/23/2018   11:07 AM 03/18/2017    8:36 AM 03/16/2016    2:01 PM 03/01/2015   10:37 AM  MMSE - Mini Mental State Exam  Orientation to time 5 5 5 5   Orientation to Place 5 5 5 5   Registration 3 3 3 3   Attention/ Calculation 0 3 4 3   Attention/Calculation-comments not attempted     Recall 2 3 3 3   Language- name 2 objects 2 2 2 2   Language- repeat 1 1 1 1   Language- follow 3 step command 3 3 3 3   Language- read & follow direction 1 1 1 1   Write a sentence 1 1 1 1   Copy design 1 1 1 1   Total score 24 28 29 28         12/14/2022    9:00 AM 12/10/2021    9:27 AM 03/27/2019   11:06 AM  6CIT Screen  What Year? 0 points 0 points 0 points  What month? 0 points 0 points 0 points  What time? 0 points 0 points 0 points  Count back from 20 0 points 0 points 0 points  Months in reverse 0 points 2 points 2 points  Repeat phrase 0 points 4 points 4 points  Total Score 0 points 6 points 6 points    Immunizations Immunization History  Administered Date(s) Administered   Fluad Quad(high Dose 65+) 06/26/2019, 05/22/2020, 06/16/2021, 06/17/2022   Influenza, High Dose Seasonal PF 06/14/2017, 07/19/2018   Influenza,inj,Quad PF,6+ Mos 06/12/2014, 06/24/2015, 06/17/2016   Moderna Sars-Covid-2 Vaccination 09/12/2019, 10/13/2019, 07/04/2020   Pneumococcal Conjugate-13 03/01/2015    Pneumococcal Polysaccharide-23 03/16/2016   Rsv, Bivalent, Protein Subunit Rsvpref,pf Verdis Frederickson) 07/09/2022   Tdap 02/16/2020   Zoster Recombinat (Shingrix) 02/17/2020, 09/17/2020    TDAP status: Up to  date  Flu Vaccine status: Up to date  Pneumococcal vaccine status: Up to date  Covid-19 vaccine status: Completed vaccines  Qualifies for Shingles Vaccine? Yes   Zostavax completed No   Shingrix Completed?: No.    Education has been provided regarding the importance of this vaccine. Patient has been advised to call insurance company to determine out of pocket expense if they have not yet received this vaccine. Advised may also receive vaccine at local pharmacy or Health Dept. Verbalized acceptance and understanding.  Screening Tests Health Maintenance  Topic Date Due   OPHTHALMOLOGY EXAM  03/20/2023   INFLUENZA VACCINE  04/01/2023   HEMOGLOBIN A1C  04/14/2023   FOOT EXAM  10/15/2023   Medicare Annual Wellness (AWV)  12/14/2023   DTaP/Tdap/Td (2 - Td or Tdap) 02/15/2030   Pneumonia Vaccine 50+ Years old  Completed   Zoster Vaccines- Shingrix  Completed   HPV VACCINES  Aged Out   COVID-19 Vaccine  Discontinued    Health Maintenance  There are no preventive care reminders to display for this patient.   Colorectal cancer screening: No longer required.   Lung Cancer Screening: (Low Dose CT Chest recommended if Age 97-80 years, 30 pack-year currently smoking OR have quit w/in 15years.) does not qualify.   Lung Cancer Screening Referral: n/a  Additional Screening:  Hepatitis C Screening: does not qualify;  Vision Screening: Recommended annual ophthalmology exams for early detection of glaucoma and other disorders of the eye. Is the patient up to date with their annual eye exam?  Yes  Who is the provider or what is the name of the office in which the patient attends annual eye exams? Dr.Rankin  If pt is not established with a provider, would they like to be referred to a  provider to establish care? No .   Dental Screening: Recommended annual dental exams for proper oral hygiene  Community Resource Referral / Chronic Care Management: CRR required this visit?  No   CCM required this visit?  No      Plan:     I have personally reviewed and noted the following in the patient's chart:   Medical and social history Use of alcohol, tobacco or illicit drugs  Current medications and supplements including opioid prescriptions. Patient is not currently taking opioid prescriptions. Functional ability and status Nutritional status Physical activity Advanced directives List of other physicians Hospitalizations, surgeries, and ER visits in previous 12 months Vitals Screenings to include cognitive, depression, and falls Referrals and appointments  In addition, I have reviewed and discussed with patient certain preventive protocols, quality metrics, and best practice recommendations. A written personalized care plan for preventive services as well as general preventive health recommendations were provided to patient.     Lorrene Reid, LPN   [id number]/[id number]   Nurse Notes: none

## 2022-12-14 NOTE — Patient Instructions (Signed)
Manuel Kline , Thank you for taking time to come for your Medicare Wellness Visit. I appreciate your ongoing commitment to your health goals. Please review the following plan we discussed and let me know if I can assist you in the future.   These are the goals we discussed:  Goals       DIET - INCREASE WATER INTAKE      Try to drink 6-8 glasses of water daily.      Increase physical activity      Chair exercises, walking, stretching       Manage Diabetes ([facility]N) (pt-stated)      Care Coordination Interventions: Discussed plans with patient for ongoing care management follow up and provided patient with direct contact information for care management team Review of patient status, including review of consultants reports, relevant laboratory and other test results, and medications completed Assessed social determinant of health barriers Follow up with patien has a Freestyle Libre 3 and blood sugars have improved so has his  A1C to 7.2       manage diarrhea ([facility]N) (pt-stated)      Care Coordination Interventions: Evaluation of current treatment plan related to diarrhea at night and patient's adherence to plan as established by provider Reviewed medications with patient and discussed imodium use and protein night snacks Discussed plans with patient for ongoing care management follow up and provided patient with direct contact information for care management team Reviewed a list of protein snacks to bulk up stool      T2DM, HLD PHARMD GOAL (pt-stated)      Current Barriers:  Unable to independently afford treatment regimen Unable to maintain control of T2DM Suboptimal therapeutic regimen for T2DM  Pharmacist Clinical Goal(s):  patient will verbalize ability to afford treatment regimen achieve control of T2DM as evidenced by GOAL A1C<7% adhere to plan to optimize therapeutic regimen for T2DM as evidenced by report of adherence to recommended medication management changes through collaboration  with PharmD and provider.   Interventions: 1:1 collaboration with Dettinger, Elige Radon, MD regarding development and update of comprehensive plan of care as evidenced by provider attestation and co-signature Inter-disciplinary care team collaboration (see longitudinal plan of care) Comprehensive medication review performed; medication list updated in electronic medical record  Diabetes: Goal on track: NO. Uncontrolled-A1C 7.7-->8%% (okay for 89 yoM), GFR 26-->19;  Current treatment: meds were initially chosen prior to pharmd appt due to cost/patient comfort; we are working to optimize them CONTINUE TRULICITY TO 3MG  SQ WEEKLY  Denies personal and family history of Medullary thyroid cancer (MTC) Continue Basaglar 25 units once daily (patient has been taking twice daily) CONTINUE FARXIGA 10MG  DAILY-okay to continue given GFR 19 ENROLLED IN [facility] PATIENT ASSISTANCE PROGRAM--(escribed to [facility]) CONTINUE Humulin R to 10-20 units twice daily with meals (no hypoglycemia)  Patient enrolled in PAP with [name] for TRULICITY, BASAGLAR AND HUMULIN R RXS ESCRIBED TO [facility] PER PROGRAM; ships 78-month supplies to patient's home every quarter(due to back orders unlikely to ship more than 1 month at a time)   Order sent for CGM libre 3 to [facility] (via [facility] portal)  Current glucose readings: fasting glucose: <200, post prandial glucose: 200s Denies hypoglycemic/hyperglycemic symptoms Discussed meal planning options and Plate method for healthy eating Avoid sugary drinks and desserts Incorporate balanced protein, non starchy veggies, 1 serving of carbohydrate with each meal Increase water intake Increase physical activity as able Current exercise: patient unable due to physical condition Recommended increase  glp1; work to optimize insulin regimen and POs for T2DM Assessed patient finances. Re enrollment and dose changes sent to [facility] patient  assistance program--will follow BP checked today--118/76 (currently holding clonidine d/t hypotension)--encouraged patient to call if BP changes  Patient Goals/Self-Care Activities patient will:  - take medications as prescribed as evidenced by patient report and record review check glucose daily (3 times) or when symptomatic, document, and provide at future appointments collaborate with provider on medication access solutions engage in dietary modifications by FOLLOWING A HEART HEALTHY DIET/HEALTHY PLATE METHOD          This is a list of the screening recommended for you and due dates:  Health Maintenance  Topic Date Due   Eye exam for diabetics  03/20/2023   Flu Shot  04/01/2023   Hemoglobin A1C  04/14/2023   Complete foot exam   10/15/2023   Medicare Annual Wellness Visit  12/14/2023   DTaP/Tdap/Td vaccine (2 - Td or Tdap) 02/15/2030   Pneumonia Vaccine  Completed   Zoster (Shingles) Vaccine  Completed   HPV Vaccine  Aged Out   COVID-19 Vaccine  Discontinued    Advanced directives: Advance directive discussed with you today. I have provided a copy for you to complete at home and have notarized. Once this is complete please bring a copy in to our office so we can scan it into your chart.   Conditions/risks identified: Aim for 30 minutes of exercise or brisk walking, 6-8 glasses of water, and 5 servings of fruits and vegetables each day.   Next appointment: Follow up in one year for your annual wellness visit.   Preventive Care 82 Years and Older, Male  Preventive care refers to lifestyle choices and visits with your health care provider that can promote health and wellness. What does preventive care include? A yearly physical exam. This is also called an annual well check. Dental exams once or twice a year. Routine eye exams. Ask your health care provider how often you should have your eyes checked. Personal lifestyle choices, including: Daily care of your teeth and  gums. Regular physical activity. Eating a healthy diet. Avoiding tobacco and drug use. Limiting alcohol use. Practicing safe sex. Taking low doses of aspirin every day. Taking vitamin and mineral supplements as recommended by your health care provider. What happens during an annual well check? The services and screenings done by your health care provider during your annual well check will depend on your age, overall health, lifestyle risk factors, and family history of disease. Counseling  Your health care provider may ask you questions about your: Alcohol use. Tobacco use. Drug use. Emotional well-being. Home and relationship well-being. Sexual activity. Eating habits. History of falls. Memory and ability to understand (cognition). Work and work Astronomer. Screening  You may have the following tests or measurements: Height, weight, and BMI. Blood pressure. Lipid and cholesterol levels. These may be checked every 5 years, or more frequently if you are over 61 years old. Skin check. Lung cancer screening. You may have this screening every year starting at age 44 if you have a 30-pack-year history of smoking and currently smoke or have quit within the past 15 years. Fecal occult blood test (FOBT) of the stool. You may have this test every year starting at age 52. Flexible sigmoidoscopy or colonoscopy. You may have a sigmoidoscopy every 5 years or a colonoscopy every 10 years starting at age 50. Prostate cancer screening. Recommendations will vary depending on your family history  and other risks. Hepatitis C blood test. Hepatitis B blood test. Sexually transmitted disease (STD) testing. Diabetes screening. This is done by checking your blood sugar (glucose) after you have not eaten for a while (fasting). You may have this done every 1-3 years. Abdominal aortic aneurysm (AAA) screening. You may need this if you are a current or former smoker. Osteoporosis. You may be screened  starting at age 73 if you are at high risk. Talk with your health care provider about your test results, treatment options, and if necessary, the need for more tests. Vaccines  Your health care provider may recommend certain vaccines, such as: Influenza vaccine. This is recommended every year. Tetanus, diphtheria, and acellular pertussis (Tdap, Td) vaccine. You may need a Td booster every 10 years. Zoster vaccine. You may need this after age 51. Pneumococcal 13-valent conjugate (PCV13) vaccine. One dose is recommended after age 100. Pneumococcal polysaccharide (PPSV23) vaccine. One dose is recommended after age 19. Talk to your health care provider about which screenings and vaccines you need and how often you need them. This information is not intended to replace advice given to you by your health care provider. Make sure you discuss any questions you have with your health care provider. Document Released: 09/13/2015 Document Revised: 05/06/2016 Document Reviewed: 06/18/2015 Elsevier Interactive Patient Education  2017 [place] Prevention in the Home Falls can cause injuries. They can happen to people of all ages. There are many things you can do to make your home safe and to help prevent falls. What can I do on the outside of my home? Regularly fix the edges of walkways and driveways and fix any cracks. Remove anything that might make you trip as you walk through a door, such as a raised step or threshold. Trim any bushes or trees on the path to your home. Use bright outdoor lighting. Clear any walking paths of anything that might make someone trip, such as rocks or tools. Regularly check to see if handrails are loose or broken. Make sure that both sides of any steps have handrails. Any raised decks and porches should have guardrails on the edges. Have any leaves, snow, or ice cleared regularly. Use sand or salt on walking paths during winter. Clean up any spills in your garage  right away. This includes oil or grease spills. What can I do in the bathroom? Use night lights. Install grab bars by the toilet and in the tub and shower. Do not use towel bars as grab bars. Use non-skid mats or decals in the tub or shower. If you need to sit down in the shower, use a plastic, non-slip stool. Keep the floor dry. Clean up any water that spills on the floor as soon as it happens. Remove soap buildup in the tub or shower regularly. Attach bath mats securely with double-sided non-slip rug tape. Do not have throw rugs and other things on the floor that can make you trip. What can I do in the bedroom? Use night lights. Make sure that you have a light by your bed that is easy to reach. Do not use any sheets or blankets that are too big for your bed. They should not hang down onto the floor. Have a firm chair that has side arms. You can use this for support while you get dressed. Do not have throw rugs and other things on the floor that can make you trip. What can I do in the kitchen? Clean up any spills  right away. Avoid walking on wet floors. Keep items that you use a lot in easy-to-reach places. If you need to reach something above you, use a strong step stool that has a grab bar. Keep electrical cords out of the way. Do not use floor polish or wax that makes floors slippery. If you must use wax, use non-skid floor wax. Do not have throw rugs and other things on the floor that can make you trip. What can I do with my stairs? Do not leave any items on the stairs. Make sure that there are handrails on both sides of the stairs and use them. Fix handrails that are broken or loose. Make sure that handrails are as long as the stairways. Check any carpeting to make sure that it is firmly attached to the stairs. Fix any carpet that is loose or worn. Avoid having throw rugs at the top or bottom of the stairs. If you do have throw rugs, attach them to the floor with carpet tape. Make  sure that you have a light switch at the top of the stairs and the bottom of the stairs. If you do not have them, ask someone to add them for you. What else can I do to help prevent falls? Wear shoes that: Do not have high heels. Have rubber bottoms. Are comfortable and fit you well. Are closed at the toe. Do not wear sandals. If you use a stepladder: Make sure that it is fully opened. Do not climb a closed stepladder. Make sure that both sides of the stepladder are locked into place. Ask someone to hold it for you, if possible. Clearly mark and make sure that you can see: Any grab bars or handrails. First and last steps. Where the edge of each step is. Use tools that help you move around (mobility aids) if they are needed. These include: Canes. Walkers. Scooters. Crutches. Turn on the lights when you go into a dark area. Replace any light bulbs as soon as they burn out. Set up your furniture so you have a clear path. Avoid moving your furniture around. If any of your floors are uneven, fix them. If there are any pets around you, be aware of where they are. Review your medicines with your doctor. Some medicines can make you feel dizzy. This can increase your chance of falling. Ask your doctor what other things that you can do to help prevent falls. This information is not intended to replace advice given to you by your health care provider. Make sure you discuss any questions you have with your health care provider. Document Released: 06/13/2009 Document Revised: 01/23/2016 Document Reviewed: 09/21/2014 Elsevier Interactive Patient Education  2017 [employer].

## 2022-12-22 ENCOUNTER — Ambulatory Visit (INDEPENDENT_AMBULATORY_CARE_PROVIDER_SITE_OTHER): Payer: Medicare Other | Admitting: Family

## 2022-12-22 ENCOUNTER — Encounter: Payer: Self-pay | Admitting: Family

## 2022-12-22 ENCOUNTER — Ambulatory Visit (INDEPENDENT_AMBULATORY_CARE_PROVIDER_SITE_OTHER): Payer: Medicare Other

## 2022-12-22 VITALS — BP 161/84 | HR 80 | Temp 97.1°F | Ht 64.0 in | Wt 212.4 lb

## 2022-12-22 DIAGNOSIS — E1122 Type 2 diabetes mellitus with diabetic chronic kidney disease: Secondary | ICD-10-CM | POA: Diagnosis not present

## 2022-12-22 DIAGNOSIS — M25512 Pain in left shoulder: Secondary | ICD-10-CM

## 2022-12-22 DIAGNOSIS — N184 Chronic kidney disease, stage 4 (severe): Secondary | ICD-10-CM | POA: Diagnosis not present

## 2022-12-22 DIAGNOSIS — M19012 Primary osteoarthritis, left shoulder: Secondary | ICD-10-CM | POA: Diagnosis not present

## 2022-12-22 DIAGNOSIS — E1169 Type 2 diabetes mellitus with other specified complication: Secondary | ICD-10-CM

## 2022-12-22 MED ORDER — METHYLPREDNISOLONE ACETATE 80 MG/ML IJ SUSP
80.0000 mg | Freq: Once | INTRAMUSCULAR | Status: AC
  Administered 2022-12-22: 80 mg via INTRA_ARTICULAR

## 2022-12-22 MED ORDER — BUPIVACAINE HCL 0.25 % IJ SOLN
1.0000 mL | Freq: Once | INTRAMUSCULAR | Status: AC
  Administered 2022-12-22: 1 mL via INTRA_ARTICULAR

## 2022-12-22 NOTE — Patient Instructions (Signed)
Shoulder Pain Many things can cause shoulder pain, including: An injury to the shoulder. Overuse of the shoulder. Arthritis. The source of the pain can be: Inflammation. An injury to the shoulder joint. An injury to a tendon, ligament, or bone. Follow these instructions at home: Pay attention to changes in your symptoms. Let your health care provider know about them. Follow these instructions to relieve your pain. If you have a removable sling: Wear the sling as told by your provider. Remove it only as told by your provider. Check the skin around the sling every day. Tell your provider about any concerns. Loosen the sling if your fingers tingle, become numb, or become cold. Keep the sling clean. If the sling is not waterproof: Do not let it get wet. Remove it to shower or bathe. Move your arm as little as possible, but keep your hand moving to prevent swelling. Managing pain, stiffness, and swelling  If told, put ice on the painful area. If you have a removable sling or immobilizer, remove it as told by your provider. Put ice in a plastic bag. Place a towel between your skin and the bag. Leave the ice on for 20 minutes, 2-3 times a day. If your skin turns bright red, remove the ice right away to prevent skin damage. The risk of damage is higher if you cannot feel pain, heat, or cold. Move your fingers often to reduce stiffness and swelling. Squeeze a soft ball or a foam pad as much as possible. This helps to keep the shoulder from swelling. It also helps to strengthen the arm. General instructions Take over-the-counter and prescription medicines only as told by your provider. Exercise may help with pain management. Perform exercises if told by your provider. You may be referred to a physical therapist to help in your recovery process. Keep all follow-up visits in order to avoid any type of permanent shoulder disability or chronic pain problems. Contact a health care provider  if: Your pain is not relieved with medicines. New pain develops in your arm, hand, or fingers. You loosen your sling and your arm, hand, or fingers remain tingly, numb, swollen, or painful. Get help right away if: Your arm, hand, or fingers turn white or blue. This information is not intended to replace advice given to you by your health care provider. Make sure you discuss any questions you have with your health care provider. Document Revised: 03/20/2022 Document Reviewed: 03/20/2022 Elsevier Patient Education  2023 [employer] Inc[employer]  

## 2022-12-22 NOTE — Progress Notes (Signed)
Subjective:    Patient ID: Manuel Kline, male    DOB: [birth year], 89 y.o.   MRN: [id number]  Chief Complaint  Patient presents with   Shoulder Pain    Left shoulder "states he feels bones moving". Been hurting for a long time he is having trouble raising it.   Pt presents to the office today with left shoulder that started months ago, but has worsen. Reports he can no longer lift it without pain.   He has diabetes and his last A1C was  7.5.   He has CKD stage 4 and can not take NSAID's.  Shoulder Pain  The pain is present in the left shoulder. This is a new problem. The current episode started more than 1 month ago. There has been no history of extremity trauma. The problem occurs intermittently. The quality of the pain is described as aching. The pain is at a severity of 10/10. The pain is moderate. Associated symptoms include a limited range of motion and numbness. Pertinent negatives include no fever, inability to bear weight, joint locking, joint swelling, stiffness or tingling. He has tried rest for the symptoms. The treatment provided no relief.      Review of Systems  Constitutional:  Negative for fever.  Musculoskeletal:  Negative for stiffness.  Neurological:  Positive for numbness. Negative for tingling.  All other systems reviewed and are negative.      Objective:   Physical Exam Vitals reviewed.  Constitutional:      General: He is not in acute distress.    Appearance: He is well-developed. He is obese.  HENT:     Head: Normocephalic.  Eyes:     General:        Right eye: No discharge.        Left eye: No discharge.     Pupils: Pupils are equal, round, and reactive to light.  Neck:     Thyroid: No thyromegaly.  Cardiovascular:     Rate and Rhythm: Normal rate and regular rhythm.     Heart sounds: Normal heart sounds. No murmur heard. Pulmonary:     Effort: Pulmonary effort is normal. No respiratory distress.     Breath sounds: Normal breath sounds. No  wheezing.  Abdominal:     General: Bowel sounds are normal. There is no distension.     Palpations: Abdomen is soft.     Tenderness: There is no abdominal tenderness.  Musculoskeletal:        General: No tenderness.     Cervical back: Normal range of motion and neck supple.     Comments: Pain in left shoulder with abduction   Skin:    General: Skin is warm and dry.     Findings: No erythema or rash.  Neurological:     Mental Status: He is alert and oriented to person, place, and time.     Cranial Nerves: No cranial nerve deficit.     Deep Tendon Reflexes: Reflexes are normal and symmetric.  Psychiatric:        Behavior: Behavior normal.        Thought Content: Thought content normal.        Judgment: Judgment normal.     leftshoulder prepped with betadine Injected with Marcaine .25% plain and methylprednisolone with 25 guage needle x 1. Patient tolerated well.   BP (!) 161/84   Pulse 80   Temp (!) 97.1 F (36.2 C) (Temporal)   Ht  (1.626 m)  Wt 212 lb 6.4 oz (96.3 kg)   SpO2 99%   BMI 36.46 kg/m      Assessment & Plan:  AKHIL PISCOPO comes in today with chief complaint of Shoulder Pain (Left shoulder "states he feels bones moving". Been hurting for a long time he is having trouble raising it.)   Diagnosis and orders addressed:  1. Acute pain of left shoulder - DG Shoulder Left - Ambulatory referral to Physical Therapy - methylPREDNISolone acetate (DEPO-MEDROL) injection 80 mg - bupivacaine (MARCAINE) 0.25 % (with pres) injection 1 mL  2. CKD (chronic kidney disease) stage 4, GFR 15-29 ml/min  3. Type 2 diabetes mellitus with other specified complication, unspecified whether long term insulin use   Strict low carb diet  Discussed at visit, last A1C was at goal  Referral to PT pending  X-ray pending  Avoid NSAID's related to CKD Keep follow up with PCP   Jannifer Rodney, FNP

## 2022-12-31 ENCOUNTER — Other Ambulatory Visit: Payer: Self-pay

## 2022-12-31 ENCOUNTER — Ambulatory Visit: Payer: Medicare Other | Attending: Family | Admitting: Physical Therapy

## 2022-12-31 DIAGNOSIS — M25612 Stiffness of left shoulder, not elsewhere classified: Secondary | ICD-10-CM | POA: Insufficient documentation

## 2022-12-31 DIAGNOSIS — M6281 Muscle weakness (generalized): Secondary | ICD-10-CM | POA: Diagnosis not present

## 2022-12-31 DIAGNOSIS — M25512 Pain in left shoulder: Secondary | ICD-10-CM | POA: Diagnosis not present

## 2022-12-31 NOTE — Therapy (Signed)
OUTPATIENT PHYSICAL THERAPY SHOULDER EVALUATION   Patient Name: Manuel Kline MRN: [id number] DOB:02/04/1932, 89 y.o., male Today's Date: 12/31/2022  END OF SESSION:  PT End of Session - 12/31/22 1509     Visit Number 1    Number of Visits 6    Date for PT Re-Evaluation 01/28/23    Authorization Type FOTO.    PT Start Time 0230    PT Stop Time 0258    PT Time Calculation (min) 28 min    Activity Tolerance Patient tolerated treatment well    Behavior During Therapy [facility] for tasks assessed/performed             Past Medical History:  Diagnosis Date   Adenomatous colon polyp 2006   CAD (coronary artery disease)    Calcium oxalate renal stones    Cardiomyopathy    Cataract    Cataract    Cerebral embolism with cerebral infarction 12/26/2018   Chronic systolic heart failure (HCC)         CKD (chronic kidney disease) stage 4, GFR 15-29 ml/min (HCC) 11/08/2017   Claudication in peripheral vascular disease (HCC) 07/23/2020   Diabetes (HCC)    Diabetic peripheral neuropathy (HCC) 01/31/2015   Dual implantable cardioverter-defibrillator in situ    01/16/2002 Dr. Ladona Ridgel  RA lead  Guidant [id number] RV lead  Guidant [id number] Generator  Guidant Prism  09/02/2009 Generator change Medtronic [id number]  SN  [id number]      Erectile dysfunction    Erosive esophagitis    Essential hypertension 11/30/2015   Hemorrhoids    History of TIA (transient ischemic attack) 01/19/2019   HLD (hyperlipidemia)    HTN (hypertension)    Hyperlipidemia    Hypertensive heart disease without CHF 07/31/2011   ICD (implantable cardiac defibrillator) in place    ICD dual chamber in situ    Ischemic cardiomyopathy    EF 40% June 2013    Left-sided weakness 12/25/2018   Metabolic syndrome    Moderate nonproliferative diabetic retinopathy of both eyes without macular edema associated with type 2 diabetes mellitus (HCC) 02/20/2020   Morbid obesity (HCC)    OSA on CPAP 11/05/2014   Osteoarthritis    Posterior  vitreous detachment of both eyes 02/20/2020   Presence of automatic (implantable) cardiac defibrillator 11/08/2017   S/P CABG (coronary artery bypass graft) 11/02/2000   Sleep apnea    Stroke-like symptoms 12/26/2018   Syncope 08/06/2009   Qualifier: Diagnosis of  By: Susette Racer CMA, Jewel     Type 2 diabetes, uncontrolled, with neuropathy    Has retinopahty and neuropathy     Ventricular tachycardia (paroxysmal) (HCC) 07/14/2016   Past Surgical History:  Procedure Laterality Date   ABDOMINAL EXPLORATION SURGERY     BACK SURGERY     X'3   cardiac bypass     CARDIAC DEFIBRILLATOR PLACEMENT     CARPAL TUNNEL RELEASE     X2, bilateral   CATARACT EXTRACTION     COLONOSCOPY  06/20/2012   Procedure: COLONOSCOPY;  Surgeon: Mardella Layman, MD;  Location: [facility];  Service: Endoscopy;  Laterality: N/A;   DOPPLER ECHOCARDIOGRAPHY  2003   EP IMPLANTABLE DEVICE N/A 07/14/2016   Procedure: ICD Generator Changeout;  Surgeon: Marinus Maw, MD;  Location: [facility] INVASIVE CV LAB;  Service: Cardiovascular;  Laterality: N/A;   ESOPHAGOGASTRODUODENOSCOPY  06/20/2012   Procedure: ESOPHAGOGASTRODUODENOSCOPY (EGD);  Surgeon: Mardella Layman, MD;  Location: [facility];  Service: Endoscopy;  Laterality: N/A;   EYE  SURGERY     LAPAROTOMY     RETINOPATHY SURGERY Bilateral    rotator cuff surgery     left   Patient Active Problem List   Diagnosis Date Noted   Porokeratosis 11/04/2022   Pneumatosis of intestines 07/21/2022   Diarrhea 07/21/2022   N&V (nausea and vomiting) 07/21/2022   Hav (hallux abducto valgus), left 05/06/2022   Callus 10/29/2021   Glaucoma suspect with open angle 04/02/2021   Pseudophakia of both eyes 08/20/2020   Claudication in peripheral vascular disease (HCC) 07/23/2020   Sleep apnea    Osteoarthritis    Metabolic syndrome    Erosive esophagitis    Erectile dysfunction    Calcium oxalate renal stones    Moderate nonproliferative diabetic retinopathy of both eyes without  macular edema associated with type 2 diabetes mellitus (HCC) 02/20/2020   Posterior vitreous detachment of both eyes 02/20/2020   History of TIA (transient ischemic attack) 01/19/2019   Cerebral embolism with cerebral infarction 12/26/2018   CKD (chronic kidney disease) stage 4, GFR 15-29 ml/min (HCC) 11/08/2017   ICD (implantable cardioverter-defibrillator) in place 11/08/2017   Ventricular tachycardia (paroxysmal) (HCC) 07/14/2016   Essential hypertension 11/30/2015   Diabetic peripheral neuropathy (HCC) 01/31/2015   OSA on CPAP 11/05/2014   CAD (coronary artery disease)    Dual implantable cardioverter-defibrillator in situ    Hyperlipidemia    Type 2 diabetes mellitus with other specified complication (HCC)    Morbid obesity (HCC)    Ischemic cardiomyopathy    Chronic systolic heart failure (HCC)    S/P CABG (coronary artery bypass graft) 11/02/2000    REFERRING PROVIDER: Jannifer Rodney   REFERRING DIAG: Acute pain of left shoulder.  THERAPY DIAG:  Stiffness of left shoulder, not elsewhere classified - Plan: PT plan of care cert/re-cert  Muscle weakness (generalized) - Plan: PT plan of care cert/re-cert  Acute pain of left shoulder - Plan: PT plan of care cert/re-cert  Rationale for Evaluation and Treatment: Rehabilitation  ONSET DATE: Ongoing but worse since a month+ ago.  SUBJECTIVE:                                                                                                                                                                                      SUBJECTIVE STATEMENT: The patient presents to the clinic with c/o ongoing left shoulder pain that has gotten much worse over the last month+ ago.  He reports his shoulder makes a lot of noise.  He states he is unable to reach out with left left UE and open doors is very difficult.  Certain movements produce a great deal of pain.  Resting his arm by  his side decreases his pain.   PERTINENT HISTORY: DM, Pacemaker,  lumbar surgery x 3.  PAIN:  Are you having pain? Yes: NPRS scale: 8/10 Pain location: Left shoulder. Pain description: Ache, sore, throb, shooting. Aggravating factors: As above. Relieving factors: As above.  PRECAUTIONS: ICD/Pacemaker.  Needs cane at all times.  WEIGHT BEARING RESTRICTIONS: No.  FALLS:  Has patient fallen in last 6 months? Yes. Number of falls 2.  LIVING ENVIRONMENT: Lives with: lives with their spouse Lives in: House/apartment Has following equipment at home:  Hurry cane.  OCCUPATION: Retired.  Former Nutritional therapist.  PLOF: Independent with household mobility with device  PATIENT GOALS:Lift arm with less pain.  NEXT MD VISIT:   OBJECTIVE:   DIAGNOSTIC FINDINGS: X-RAY:  12/24/22.  FINDINGS: AC joint degenerative change with superiorly and inferiorly directed osteophytes. No fracture or dislocation. Spurring along the undersurface of the acromion. No other bony or soft tissue abnormalities are noted.   IMPRESSION: AC joint degenerative changes with superiorly and inferiorly directed osteophytes. Spurring along the undersurface of the acromion.  PATIENT SURVEYS:  FOTO 38.  POSTURE: Forward head, rounded shoulder, possible "popeye" muscle on left.  UPPER EXTREMITY ROM:  Active left shoulder flexion to 40 degrees but passively through a full range of motion.  ER is 30 degrees active and 45 degrees passive.  Active abduction to 60 degrees.  Behind back very limited to left hip.  UPPER EXTREMITY MMT:  Patient's left shoulder brought passively into flexion and abduction to 90 degrees and he was easily able to hold against gravity.  He is weak into left shoulder ER graded at 3- to 3/5.  IR strength is a solid 4/5.  SHOULDER SPECIAL TESTS: Some pain reproduction with left shoulder impingement testing.  (-) Drop Arm test.  PALPATION:  Palpable pain over left ACJ and a great deal of crepitus with active and passive movement of his left shoulder.  He had  pain complaint sin his posterior cuff region as well.    PATIENT EDUCATION: Education details: Discussed therapeutic interventions with hopes of improving function and decreasing pain. Person educated: Patient Education method: Explanation Education comprehension: verbalized understanding  HOME EXERCISE PROGRAM:   ASSESSMENT:  CLINICAL IMPRESSION: The patient presents to OPPT with c/o left shoulder pain.  He has a notable loss of active left shoulder range of motion.  His shoulder is weak, most notably into ER.  He has a great deal of pain and crepitus over his left ACJ.  He has lost function in his left shoulder and his FOTO limitation score is 38.  He is unable to reach hand his back with his left hand.  When passively range to 90 degrees of left shoulder flexion and abduction he is easily able to hold his left UE against gravity.  Patient may benefit from skilled physical therapy intervention to address pain and deficits. OBJECTIVE IMPAIRMENTS: decreased activity tolerance, decreased ROM, decreased strength, increased muscle spasms, postural dysfunction, and pain.   ACTIVITY LIMITATIONS: carrying, lifting, dressing, and reach over head  PARTICIPATION LIMITATIONS: meal prep, cleaning, laundry, and yard work  PERSONAL FACTORS: 1 comorbidity: left ACJ degenerative changes  are also affecting patient's functional outcome.   REHAB POTENTIAL: Fair .  CLINICAL DECISION MAKING: Evolving/moderate complexity  EVALUATION COMPLEXITY: Low   GOALS: SHORT TERM GOALS: Target date: 01/14/23  Ind with an initial HEP. Goal status: INITIAL   LONG TERM GOALS: Target date: 01/28/23.  Ind with advanced HEP.  Goal status: INITIAL  2.  Active  left shoulder flexion to 125 degrees.  Goal status: INITIAL  3.  Improve left shoulder ER strength to 4/5.  Goal status: INITIAL  4.  Perform ADL's with left shoulder pain not > 4/10.  Goal status: INITIAL  PLAN:  PT FREQUENCY: 1-2 times a  week.  PT DURATION: 6 visits.  PLANNED INTERVENTIONS: Therapeutic exercises, Therapeutic activity, Patient/Family education, Self Care, Joint mobilization, Dry Needling, Cryotherapy, Moist heat, and Manual therapy  PLAN FOR NEXT SESSION: Pulleys, AAROM, seated UE ranger, rhy stabs, isometrics, STW/M as needed.   Baylie Drakes, [place], PT 12/31/2022, 3:39 PM

## 2023-01-07 ENCOUNTER — Encounter: Payer: Self-pay | Admitting: Physical Therapy

## 2023-01-07 ENCOUNTER — Ambulatory Visit: Payer: Medicare Other | Admitting: Physical Therapy

## 2023-01-07 DIAGNOSIS — M25612 Stiffness of left shoulder, not elsewhere classified: Secondary | ICD-10-CM | POA: Diagnosis not present

## 2023-01-07 DIAGNOSIS — M6281 Muscle weakness (generalized): Secondary | ICD-10-CM

## 2023-01-07 DIAGNOSIS — M25512 Pain in left shoulder: Secondary | ICD-10-CM

## 2023-01-07 NOTE — Therapy (Addendum)
OUTPATIENT PHYSICAL THERAPY SHOULDER TREATMENT   Patient Name: Manuel Kline MRN: [id number] DOB:15-Aug-1932, 89 y.o., male Today's Date: 01/07/2023  END OF SESSION:  PT End of Session - 01/07/23 1436     Visit Number 2    Number of Visits 6    Date for PT Re-Evaluation 01/28/23    Authorization Type FOTO.    PT Start Time 1432    PT Stop Time 1514    PT Time Calculation (min) 42 min    Activity Tolerance Patient tolerated treatment well    Behavior During Therapy [facility] for tasks assessed/performed            Past Medical History:  Diagnosis Date   Adenomatous colon polyp 2006   CAD (coronary artery disease)    Calcium oxalate renal stones    Cardiomyopathy    Cataract    Cataract    Cerebral embolism with cerebral infarction 12/26/2018   Chronic systolic heart failure (HCC)         CKD (chronic kidney disease) stage 4, GFR 15-29 ml/min (HCC) 11/08/2017   Claudication in peripheral vascular disease (HCC) 07/23/2020   Diabetes (HCC)    Diabetic peripheral neuropathy (HCC) 01/31/2015   Dual implantable cardioverter-defibrillator in situ    01/16/2002 Dr. Ladona Ridgel  RA lead  Guidant [id number] RV lead  Guidant [id number] Generator  Guidant Prism  09/02/2009 Generator change Medtronic [id number]  SN  [id number]      Erectile dysfunction    Erosive esophagitis    Essential hypertension 11/30/2015   Hemorrhoids    History of TIA (transient ischemic attack) 01/19/2019   HLD (hyperlipidemia)    HTN (hypertension)    Hyperlipidemia    Hypertensive heart disease without CHF 07/31/2011   ICD (implantable cardiac defibrillator) in place    ICD dual chamber in situ    Ischemic cardiomyopathy    EF 40% June 2013    Left-sided weakness 12/25/2018   Metabolic syndrome    Moderate nonproliferative diabetic retinopathy of both eyes without macular edema associated with type 2 diabetes mellitus (HCC) 02/20/2020   Morbid obesity (HCC)    OSA on CPAP 11/05/2014   Osteoarthritis    Posterior vitreous  detachment of both eyes 02/20/2020   Presence of automatic (implantable) cardiac defibrillator 11/08/2017   S/P CABG (coronary artery bypass graft) 11/02/2000   Sleep apnea    Stroke-like symptoms 12/26/2018   Syncope 08/06/2009   Qualifier: Diagnosis of  By: Susette Racer CMA, Jewel     Type 2 diabetes, uncontrolled, with neuropathy    Has retinopahty and neuropathy     Ventricular tachycardia (paroxysmal) (HCC) 07/14/2016   Past Surgical History:  Procedure Laterality Date   ABDOMINAL EXPLORATION SURGERY     BACK SURGERY     X'3   cardiac bypass     CARDIAC DEFIBRILLATOR PLACEMENT     CARPAL TUNNEL RELEASE     X2, bilateral   CATARACT EXTRACTION     COLONOSCOPY  06/20/2012   Procedure: COLONOSCOPY;  Surgeon: Mardella Layman, MD;  Location: [facility];  Service: Endoscopy;  Laterality: N/A;   DOPPLER ECHOCARDIOGRAPHY  2003   EP IMPLANTABLE DEVICE N/A 07/14/2016   Procedure: ICD Generator Changeout;  Surgeon: Marinus Maw, MD;  Location: [facility] INVASIVE CV LAB;  Service: Cardiovascular;  Laterality: N/A;   ESOPHAGOGASTRODUODENOSCOPY  06/20/2012   Procedure: ESOPHAGOGASTRODUODENOSCOPY (EGD);  Surgeon: Mardella Layman, MD;  Location: [facility];  Service: Endoscopy;  Laterality: N/A;   EYE SURGERY  LAPAROTOMY     RETINOPATHY SURGERY Bilateral    rotator cuff surgery     left   Patient Active Problem List   Diagnosis Date Noted   Porokeratosis 11/04/2022   Pneumatosis of intestines 07/21/2022   Diarrhea 07/21/2022   N&V (nausea and vomiting) 07/21/2022   Hav (hallux abducto valgus), left 05/06/2022   Callus 10/29/2021   Glaucoma suspect with open angle 04/02/2021   Pseudophakia of both eyes 08/20/2020   Claudication in peripheral vascular disease (HCC) 07/23/2020   Sleep apnea    Osteoarthritis    Metabolic syndrome    Erosive esophagitis    Erectile dysfunction    Calcium oxalate renal stones    Moderate nonproliferative diabetic retinopathy of both eyes without macular  edema associated with type 2 diabetes mellitus (HCC) 02/20/2020   Posterior vitreous detachment of both eyes 02/20/2020   History of TIA (transient ischemic attack) 01/19/2019   Cerebral embolism with cerebral infarction 12/26/2018   CKD (chronic kidney disease) stage 4, GFR 15-29 ml/min (HCC) 11/08/2017   ICD (implantable cardioverter-defibrillator) in place 11/08/2017   Ventricular tachycardia (paroxysmal) (HCC) 07/14/2016   Essential hypertension 11/30/2015   Diabetic peripheral neuropathy (HCC) 01/31/2015   OSA on CPAP 11/05/2014   CAD (coronary artery disease)    Dual implantable cardioverter-defibrillator in situ    Hyperlipidemia    Type 2 diabetes mellitus with other specified complication (HCC)    Morbid obesity (HCC)    Ischemic cardiomyopathy    Chronic systolic heart failure (HCC)    S/P CABG (coronary artery bypass graft) 11/02/2000   REFERRING PROVIDER: Jannifer Rodney   REFERRING DIAG: Acute pain of left shoulder.  THERAPY DIAG:  Stiffness of left shoulder, not elsewhere classified  Muscle weakness (generalized)  Acute pain of left shoulder  Rationale for Evaluation and Treatment: Rehabilitation  ONSET DATE: Ongoing but worse since a month+ ago.  SUBJECTIVE:                                                                                                                                                                                      SUBJECTIVE STATEMENT: "My shoulder rattles when I move it."  PERTINENT HISTORY: DM, Pacemaker, lumbar surgery x 3.  PAIN:  Are you having pain? Yes: NPRS scale: 8/10 Pain location: Left shoulder. Pain description: Ache, sore, throb, shooting. Aggravating factors: As above. Relieving factors: As above.  PRECAUTIONS: ICD/Pacemaker.  Needs cane at all times.  WEIGHT BEARING RESTRICTIONS: No.  FALLS:  Has patient fallen in last 6 months? Yes. Number of falls 2.  PATIENT GOALS:Lift arm with less pain.  NEXT MD VISIT:    OBJECTIVE:   DIAGNOSTIC  FINDINGS: X-RAY:  12/24/22.  FINDINGS: AC joint degenerative change with superiorly and inferiorly directed osteophytes. No fracture or dislocation. Spurring along the undersurface of the acromion. No other bony or soft tissue abnormalities are noted.   IMPRESSION: AC joint degenerative changes with superiorly and inferiorly directed osteophytes. Spurring along the undersurface of the acromion.  PATIENT SURVEYS:  FOTO 38.  POSTURE: Forward head, rounded shoulder, possible "popeye" muscle on left.  UPPER EXTREMITY ROM:  Active left shoulder flexion to 40 degrees but passively through a full range of motion.  ER is 30 degrees active and 45 degrees passive.  Active abduction to 60 degrees.  Behind back very limited to left hip.  UPPER EXTREMITY MMT: Patient's left shoulder brought passively into flexion and abduction to 90 degrees and he was easily able to hold against gravity.  He is weak into left shoulder ER graded at 3- to 3/5.  IR strength is a solid 4/5.  SHOULDER SPECIAL TESTS: Some pain reproduction with left shoulder impingement testing.  (-) Drop Arm test.  PALPATION:  Palpable pain over left ACJ and a great deal of crepitus with active and passive movement of his left shoulder.  He had pain complaint sin his posterior cuff region as well.                               01/07/23  EXERCISE LOG  Exercise Repetitions and Resistance Comments  Nustep X6 min L4  For UE motion  UE ranger Flex/ext x3 min, circles x3 min   Isometrics Flex, ER, IR, abduction, ext x10 reps 3 sec holds each Pain with IR, ER, abduct, ext especially  Partial upper cut X20 reps  L hand becoming numb  Partial short lever abduction X15 reps Crepitus   Blank cell = exercise not performed today   Manual Therapy Soft Tissue Mobilization: L deltoids, posterior cuff, reduce muscle guarding    PATIENT EDUCATION: Education details: Discussed therapeutic interventions with hopes  of improving function and decreasing pain. Person educated: Patient Education method: Explanation Education comprehension: verbalized understanding  HOME EXERCISE PROGRAM:  ASSESSMENT:  CLINICAL IMPRESSION: Patient arriving in clinic with reports of high level L shoulder pain along with creptius and popping with movement. Patient limited with therex due to pain as Nustep terminated early. Patient also progressed with light isometrics at 50% effort due to pain. Patient's L shoulder palpated with considerable L deltoids and bicep atrophy. Moderate tone of the L deltoids and posterior cuff.   OBJECTIVE IMPAIRMENTS: decreased activity tolerance, decreased ROM, decreased strength, increased muscle spasms, postural dysfunction, and pain.   ACTIVITY LIMITATIONS: carrying, lifting, dressing, and reach over head  PARTICIPATION LIMITATIONS: meal prep, cleaning, laundry, and yard work  PERSONAL FACTORS: 1 comorbidity: left ACJ degenerative changes  are also affecting patient's functional outcome.   REHAB POTENTIAL: Fair .  CLINICAL DECISION MAKING: Evolving/moderate complexity  EVALUATION COMPLEXITY: Low   GOALS: SHORT TERM GOALS: Target date: 01/14/23  Ind with an initial HEP. Goal status: INITIAL   LONG TERM GOALS: Target date: 01/28/23.  Ind with advanced HEP.  Goal status: INITIAL  2.  Active left shoulder flexion to 125 degrees.  Goal status: INITIAL  3.  Improve left shoulder ER strength to 4/5.  Goal status: INITIAL  4.  Perform ADL's with left shoulder pain not > 4/10.  Goal status: INITIAL  PLAN:  PT FREQUENCY: 1-2 times a week.  PT DURATION: 6 visits.  PLANNED INTERVENTIONS: Therapeutic exercises, Therapeutic activity, Patient/Family education, Self Care, Joint mobilization, Dry Needling, Cryotherapy, Moist heat, and Manual therapy  PLAN FOR NEXT SESSION: Pulleys, AAROM, seated UE ranger, rhy stabs, isometrics, STW/M as needed.   Marvell Fuller, PTA 01/07/2023,  4:43 PM

## 2023-01-14 ENCOUNTER — Encounter: Payer: Self-pay | Admitting: Physical Therapy

## 2023-01-14 ENCOUNTER — Ambulatory Visit: Payer: Medicare Other | Admitting: Physical Therapy

## 2023-01-14 DIAGNOSIS — M25612 Stiffness of left shoulder, not elsewhere classified: Secondary | ICD-10-CM | POA: Diagnosis not present

## 2023-01-14 DIAGNOSIS — M6281 Muscle weakness (generalized): Secondary | ICD-10-CM | POA: Diagnosis not present

## 2023-01-14 DIAGNOSIS — M25512 Pain in left shoulder: Secondary | ICD-10-CM

## 2023-01-14 NOTE — Therapy (Addendum)
OUTPATIENT PHYSICAL THERAPY SHOULDER TREATMENT   Patient Name: Manuel Kline MRN: [id number] DOB:07-02-1932, 89 y.o., male Today's Date: 01/14/2023  END OF SESSION:  PT End of Session - 01/14/23 1417     Visit Number 3    Number of Visits 6    Date for PT Re-Evaluation 01/28/23    Authorization Type FOTO.    PT Start Time 1431    PT Stop Time 1512    PT Time Calculation (min) 41 min    Activity Tolerance Patient tolerated treatment well    Behavior During Therapy [facility] for tasks assessed/performed            Past Medical History:  Diagnosis Date   Adenomatous colon polyp 2006   CAD (coronary artery disease)    Calcium oxalate renal stones    Cardiomyopathy    Cataract    Cataract    Cerebral embolism with cerebral infarction 12/26/2018   Chronic systolic heart failure (HCC)         CKD (chronic kidney disease) stage 4, GFR 15-29 ml/min (HCC) 11/08/2017   Claudication in peripheral vascular disease (HCC) 07/23/2020   Diabetes (HCC)    Diabetic peripheral neuropathy (HCC) 01/31/2015   Dual implantable cardioverter-defibrillator in situ    01/16/2002 Dr. Ladona Ridgel  RA lead  Guidant [id number] RV lead  Guidant [id number] Generator  Guidant Prism  09/02/2009 Generator change Medtronic [id number]  SN  [id number]      Erectile dysfunction    Erosive esophagitis    Essential hypertension 11/30/2015   Hemorrhoids    History of TIA (transient ischemic attack) 01/19/2019   HLD (hyperlipidemia)    HTN (hypertension)    Hyperlipidemia    Hypertensive heart disease without CHF 07/31/2011   ICD (implantable cardiac defibrillator) in place    ICD dual chamber in situ    Ischemic cardiomyopathy    EF 40% June 2013    Left-sided weakness 12/25/2018   Metabolic syndrome    Moderate nonproliferative diabetic retinopathy of both eyes without macular edema associated with type 2 diabetes mellitus (HCC) 02/20/2020   Morbid obesity (HCC)    OSA on CPAP 11/05/2014   Osteoarthritis    Posterior vitreous  detachment of both eyes 02/20/2020   Presence of automatic (implantable) cardiac defibrillator 11/08/2017   S/P CABG (coronary artery bypass graft) 11/02/2000   Sleep apnea    Stroke-like symptoms 12/26/2018   Syncope 08/06/2009   Qualifier: Diagnosis of  By: Susette Racer CMA, Jewel     Type 2 diabetes, uncontrolled, with neuropathy    Has retinopahty and neuropathy     Ventricular tachycardia (paroxysmal) (HCC) 07/14/2016   Past Surgical History:  Procedure Laterality Date   ABDOMINAL EXPLORATION SURGERY     BACK SURGERY     X'3   cardiac bypass     CARDIAC DEFIBRILLATOR PLACEMENT     CARPAL TUNNEL RELEASE     X2, bilateral   CATARACT EXTRACTION     COLONOSCOPY  06/20/2012   Procedure: COLONOSCOPY;  Surgeon: Mardella Layman, MD;  Location: [facility];  Service: Endoscopy;  Laterality: N/A;   DOPPLER ECHOCARDIOGRAPHY  2003   EP IMPLANTABLE DEVICE N/A 07/14/2016   Procedure: ICD Generator Changeout;  Surgeon: Marinus Maw, MD;  Location: [facility] INVASIVE CV LAB;  Service: Cardiovascular;  Laterality: N/A;   ESOPHAGOGASTRODUODENOSCOPY  06/20/2012   Procedure: ESOPHAGOGASTRODUODENOSCOPY (EGD);  Surgeon: Mardella Layman, MD;  Location: [facility];  Service: Endoscopy;  Laterality: N/A;   EYE SURGERY  LAPAROTOMY     RETINOPATHY SURGERY Bilateral    rotator cuff surgery     left   Patient Active Problem List   Diagnosis Date Noted   Porokeratosis 11/04/2022   Pneumatosis of intestines 07/21/2022   Diarrhea 07/21/2022   N&V (nausea and vomiting) 07/21/2022   Hav (hallux abducto valgus), left 05/06/2022   Callus 10/29/2021   Glaucoma suspect with open angle 04/02/2021   Pseudophakia of both eyes 08/20/2020   Claudication in peripheral vascular disease (HCC) 07/23/2020   Sleep apnea    Osteoarthritis    Metabolic syndrome    Erosive esophagitis    Erectile dysfunction    Calcium oxalate renal stones    Moderate nonproliferative diabetic retinopathy of both eyes without macular  edema associated with type 2 diabetes mellitus (HCC) 02/20/2020   Posterior vitreous detachment of both eyes 02/20/2020   History of TIA (transient ischemic attack) 01/19/2019   Cerebral embolism with cerebral infarction 12/26/2018   CKD (chronic kidney disease) stage 4, GFR 15-29 ml/min (HCC) 11/08/2017   ICD (implantable cardioverter-defibrillator) in place 11/08/2017   Ventricular tachycardia (paroxysmal) (HCC) 07/14/2016   Essential hypertension 11/30/2015   Diabetic peripheral neuropathy (HCC) 01/31/2015   OSA on CPAP 11/05/2014   CAD (coronary artery disease)    Dual implantable cardioverter-defibrillator in situ    Hyperlipidemia    Type 2 diabetes mellitus with other specified complication (HCC)    Morbid obesity (HCC)    Ischemic cardiomyopathy    Chronic systolic heart failure (HCC)    S/P CABG (coronary artery bypass graft) 11/02/2000   REFERRING PROVIDER: Jannifer Rodney   REFERRING DIAG: Acute pain of left shoulder.  THERAPY DIAG:  Stiffness of left shoulder, not elsewhere classified  Muscle weakness (generalized)  Acute pain of left shoulder  Rationale for Evaluation and Treatment: Rehabilitation  ONSET DATE: Ongoing but worse since a month+ ago.  SUBJECTIVE:                                                                                                                                                                                      SUBJECTIVE STATEMENT: Reports still having difficulty and pain with lifting and reaching OH with LUE. Has popping and cracking with cross body reaching too.  PERTINENT HISTORY: DM, Pacemaker, lumbar surgery x 3.  PAIN:  Are you having pain? Yes: NPRS scale: no number provided/10 Pain location: Left shoulder. Pain description: Ache, sore, throb, shooting. Aggravating factors: As above. Relieving factors: As above.  PRECAUTIONS: ICD/Pacemaker.  Needs cane at all times.  WEIGHT BEARING RESTRICTIONS: No.  FALLS:  Has  patient fallen in last 6 months? Yes. Number of falls 2.  PATIENT GOALS:Lift arm with less pain.  NEXT MD VISIT:   OBJECTIVE:   DIAGNOSTIC FINDINGS: X-RAY:  12/24/22.  FINDINGS: AC joint degenerative change with superiorly and inferiorly directed osteophytes. No fracture or dislocation. Spurring along the undersurface of the acromion. No other bony or soft tissue abnormalities are noted.   IMPRESSION: AC joint degenerative changes with superiorly and inferiorly directed osteophytes. Spurring along the undersurface of the acromion.  PATIENT SURVEYS:  FOTO 38.  POSTURE: Forward head, rounded shoulder, possible "popeye" muscle on left.  UPPER EXTREMITY ROM:  Active left shoulder flexion to 40 degrees but passively through a full range of motion.  ER is 30 degrees active and 45 degrees passive.  Active abduction to 60 degrees.  Behind back very limited to left hip.  UPPER EXTREMITY MMT: Patient's left shoulder brought passively into flexion and abduction to 90 degrees and he was easily able to hold against gravity.  He is weak into left shoulder ER graded at 3- to 3/5.  IR strength is a solid 4/5.  SHOULDER SPECIAL TESTS: Some pain reproduction with left shoulder impingement testing.  (-) Drop Arm test.  PALPATION:  Palpable pain over left ACJ and a great deal of crepitus with active and passive movement of his left shoulder.  He had pain complaint sin his posterior cuff region as well.                               01/14/23  EXERCISE LOG  Exercise Repetitions and Resistance Comments  Nustep X15 min L1  For UE motion  Wall ladder X4 reps  Max 17  UE ranger Flex/ext x3 min, circles x3 min Greater pain with flex/ext  Isometrics Flex, ER, IR, abduction, ext x10 reps 3 sec holds each Pain throughout session3000  AAROM upper cut X15 reps  L hand becoming numb  AAROM bicep curls X20 reps   Partial short lever abduction X15 reps   AAROM ER X20 reps Reported burning, stinging    Blank cell = exercise not performed today   Manual Therapy Soft Tissue Mobilization: L deltoids, posterior cuff, reduce muscle guarding    PATIENT EDUCATION: Education details: Discussed therapeutic interventions with hopes of improving function and decreasing pain. Person educated: Patient Education method: Explanation Education comprehension: verbalized understanding  HOME EXERCISE PROGRAM:  ASSESSMENT:  CLINICAL IMPRESSION: Patient presented in clinic with reports of continued L shoulder pain limiting function. Patient able to tolerate nustep with lowest resistance today with no complaints of pain. More AAROM and isometrics attempted but throbbing pain reported throughout therex session. Muscle tightness palpable in posterior cuff and deltoids. By end of session, patient was reporting throbbing with only certain motions.  OBJECTIVE IMPAIRMENTS: decreased activity tolerance, decreased ROM, decreased strength, increased muscle spasms, postural dysfunction, and pain.   ACTIVITY LIMITATIONS: carrying, lifting, dressing, and reach over head  PARTICIPATION LIMITATIONS: meal prep, cleaning, laundry, and yard work  PERSONAL FACTORS: 1 comorbidity: left ACJ degenerative changes  are also affecting patient's functional outcome.   REHAB POTENTIAL: Fair .  CLINICAL DECISION MAKING: Evolving/moderate complexity  EVALUATION COMPLEXITY: Low  GOALS: SHORT TERM GOALS: Target date: 01/14/23  Ind with an initial HEP. Goal status: INITIAL   LONG TERM GOALS: Target date: 01/28/23.  Ind with advanced HEP.  Goal status: INITIAL  2.  Active left shoulder flexion to 125 degrees.  Goal status: INITIAL  3.  Improve left shoulder ER strength to 4/5.  Goal  status: INITIAL  4.  Perform ADL's with left shoulder pain not > 4/10.  Goal status: INITIAL  PLAN:  PT FREQUENCY: 1-2 times a week.  PT DURATION: 6 visits.  PLANNED INTERVENTIONS: Therapeutic exercises, Therapeutic activity,  Patient/Family education, Self Care, Joint mobilization, Dry Needling, Cryotherapy, Moist heat, and Manual therapy  PLAN FOR NEXT SESSION: Pulleys, AAROM, seated UE ranger, rhy stabs, isometrics, STW/M as needed.   Marvell Fuller, PTA 01/14/2023, 3:26 PM

## 2023-01-17 ENCOUNTER — Other Ambulatory Visit: Payer: Self-pay | Admitting: Family Medicine

## 2023-01-17 DIAGNOSIS — E1142 Type 2 diabetes mellitus with diabetic polyneuropathy: Secondary | ICD-10-CM

## 2023-01-19 NOTE — Progress Notes (Signed)
PATIENT: Manuel Kline DOB: [birth year]  REASON FOR VISIT: follow up HISTORY FROM: patient  Chief Complaint  Patient presents with   Room 1    Pt is here with his Son. Pt states that things have been pretty good with his CPAP Machine.      HISTORY OF PRESENT ILLNESS:  01/20/23 ALL:  Melvyn returns for follow up for OSA on CPAP. He continues to do well with therapy. He is using CPAP nightly for about 7 hours. He denies concerns with machine or supplies. His son reports they were not changing supplies regularly but now seem to be doing better. He does have a leak but not bothered by this and AHI well managed. He continues close follow up with PCP. He has had a couple falls. Using cane. Now working with PT. He continues Plavix and rosuvastatin.     01/14/2022 ALL: [name] is a 89 y.o. male here today for follow up for OSA on CPAP.  He was seen by Dr Vickey Huger 10/2021 needing a new CPAP. HST 11/03/2021 confirmed severe OSA with total AHI 42.2/hr, REM AHI 72.5/hr with hypoxia noted. AutoPAP was ordered. He repots doing very well on CPAP. He has adjusted to new machine. He is using therapy about 7.5 hours every night. He reports that he sleeps very well. No concerns with machine or supplies.   He is followed closely by PCP for stroke prevention. He continues Plavix and rosuvastatin.     HISTORY: (copied from Dr Dohmeier's previous note)  [name] is a 89 y.o. year old White or Caucasian male patient seen here as a referral on 10/21/2021 from Dr Louanne Skye, MD.    Chief concern according to patient :  Rm 10, w daughter Shannahan. Internal referral for OSA. Last SS done in 2015. Pt is severely hearing impaired, sleeps more than usual, has neuropathy-pacemaker, CKD , here to re visit since last visit with Dr. Pearlean Brownie in 2020 for TIA and a small stroke, incoherent, facial droop.   Pt has been using his CPAP nightly but seems to not be working properly. Pt here to get a new CPAP is possible  DME [facility].    The patient had the first sleep study in the year 2007.   Note from 07-2014: Mr. [name] was last seen 7-1/2 years ago in my practice, at that time a patient of Dr Lysbeth Galas.  At the time he carried the diagnosis of a complicated or so-called complex sleep apnea syndrome. The patient was placed on a VPAP because of his cardiac history including cardiomyopathy, coronary artery disease, congestive heart failure and he has a defibrillator implanted. His residual apnea index was 7.5 his sleep study in 2007 diagnosed him with an AHI of 77 apneas per hour of sleep. The patient had meanwhile felt that the machine did not longer benefit him or that it actually multifunctioned. He has not been using it since 2012, but never contacted [place] or the DME. He is here today to see if he actually still needs apnea treatment and if his condition is still present.  He is here today to find out  what the best treatment would be should be still find apnea to be present.   Sleep relevant medical history: Mr. [name] has used his most recent CPAP continuously.  He is now "reading it up" and he needs desperately either a new machine or at least new supplies.  I understand that his machine was prescribed in  2015 so it is by now 7-1/87 years old and it would need to be replaced in any case.  Our possibilities of testing however are quite different from the time when we first met.  We can do a home sleep test to simply confirm the presence of sleep apnea and then based on that order a CPAP device for him.  If he has complex sleep apnea so-called central sleep apnea we would still prefer for the patient to come to the sleep lab to be titrated as some patients get worse on the CPAP.   Family medical /sleep history:  daughter and son on CPAP with OSA, had 9 siblings, 2 brothers had OSA.    Social history:  Patient is retired from  plumbing, and lives in a household with spouse, and a daughter lives close by, ex son in law is helping.   Meal on wheels, daughter sets pill boxes.   Family status is married , with adult children, and grandchildren.  Active churchgoer,  Tobacco use never .  ETOH use none, Caffeine intake in form of Coffee( 1 in am ) Hobbies :[employer]  Sleep habits are as follows: The patient's dinner time is between 5-6 PM. He snacks later- high carb diet- The patient goes to bed at 11 PM and continues to sleep for 6-7 hours, he kicks and moves all the  time- wakes for 1-2 bathroom breaks.  He reports foot pain and jerking in daytime, and while resting in his recliner.  The preferred sleep position is supine and on his side , with the support of 2 pillows. Head of bed elevated. Dreams are reportedly frequent.  7  AM is the usual rise time. The patient wakes up spontaneously/- he has a "breakfast cook" coming to his home.  He reports not feeling refreshed or restored in AM, with symptoms such as dry mouth, leg pain. Naps are taken frequently, lasting from 10 to 30 minutes and are more refreshing than nocturnal sleep.    REVIEW OF SYSTEMS: Out of a complete 14 system review of symptoms, the patient complains only of the following symptoms, joint pain, falls and all other reviewed systems are negative.  ESS: 12/24  ALLERGIES: No Known Allergies  HOME MEDICATIONS: Outpatient Medications Prior to Visit  Medication Sig Dispense Refill   allopurinol (ZYLOPRIM) 300 MG tablet Take 1/2 (one-half) tablet by mouth once daily 45 tablet 3   benazepril (LOTENSIN) 40 MG tablet Take 1 tablet (40 mg total) by mouth daily. 90 tablet 3   clopidogrel (PLAVIX) 75 MG tablet Take 1 tablet (75 mg total) by mouth daily. 90 tablet 3   Cyanocobalamin (VITAMIN B 12 PO) Take 1,000 mcg by mouth daily.     dapagliflozin propanediol (FARXIGA) 10 MG TABS tablet Take 1 tablet (10 mg total) by mouth daily before breakfast. 90 tablet 5   Dulaglutide (TRULICITY) 3 MG/0.5ML SOPN Inject 3 mg as directed once a week. [name] patient  assistance program 6 mL 11   DULoxetine (CYMBALTA) 60 MG capsule Take 1 capsule (60 mg total) by mouth daily. 90 capsule 3   fluticasone (CUTIVATE) 0.05 % cream Apply 1 application. topically daily.     furosemide (LASIX) 40 MG tablet Take 1 tablet (40 mg total) by mouth 2 (two) times daily. 180 tablet 3   gabapentin (NEURONTIN) 300 MG capsule Take 1 capsule by mouth twice daily 180 capsule 0   Insulin Glargine (BASAGLAR KWIKPEN) 100 UNIT/ML Inject 25 Units into the skin  daily. 60 mL 5   Insulin Pen Needle (B-D UF III MINI PEN NEEDLES) 31G X 5 MM MISC 1 each by Does not apply route 4 (four) times daily. 120 each 11   insulin regular (HUMULIN R) 100 units/mL injection Inject 0.3 mLs (30 Units total) into the skin 2 (two) times daily before a meal. 60 mL 3   Insulin Syringe-Needle U-100 (RELION INSULIN SYRINGE 1ML/31G) 31G X 5/16" 1 ML MISC USE TO INJECT INSULIN TWICE DAILY AS DIRECTED. DX: E11.65 (Patient taking differently: 1 each by Other route See admin instructions. USE TO INJECT INSULIN TWICE DAILY AS DIRECTED. DX: E11.65) 200 each 11   metoprolol succinate (TOPROL-XL) 50 MG 24 hr tablet TAKE 1 TABLET BY MOUTH ONCE DAILY WITH OR IMMEDIATELY FOLLOWING A MEAL (Patient taking differently: Take 50 mg by mouth daily. TAKE 1 TABLET BY MOUTH ONCE DAILY WITH OR IMMEDIATELY FOLLOWING A MEAL) 90 tablet 3   Multiple Vitamin (MULTIVITAMIN ADULT PO) Take 1 tablet by mouth daily.     nitroGLYCERIN (NITROSTAT) 0.4 MG SL tablet Place 1 tablet (0.4 mg total) under the tongue every 5 (five) minutes as needed for chest pain. 25 tablet 0   Omega-3 Fatty Acids (FISH OIL) 1000 MG CPDR Take 1 tablet by mouth daily.     polyethylene glycol powder (GLYCOLAX/MIRALAX) powder Take 17 g by mouth 2 (two) times daily as needed. (Patient taking differently: Take 17 g by mouth 2 (two) times daily as needed for mild constipation or moderate constipation.) 3350 g 1   rosuvastatin (CRESTOR) 5 MG tablet Take 1 tablet (5 mg total) by  mouth daily. 90 tablet 3   vitamin C (ASCORBIC ACID) 500 MG tablet Take 500 mg by mouth daily.     No facility-administered medications prior to visit.    PAST MEDICAL HISTORY: Past Medical History:  Diagnosis Date   Adenomatous colon polyp 2006   CAD (coronary artery disease)    Calcium oxalate renal stones    Cardiomyopathy    Cataract    Cataract    Cerebral embolism with cerebral infarction 12/26/2018   Chronic systolic heart failure (HCC)         CKD (chronic kidney disease) stage 4, GFR 15-29 ml/min (HCC) 11/08/2017   Claudication in peripheral vascular disease (HCC) 07/23/2020   Dehydration 07/2022   Diabetes (HCC)    Diabetic peripheral neuropathy (HCC) 01/31/2015   Dual implantable cardioverter-defibrillator in situ    01/16/2002 Dr. Ladona Ridgel  RA lead  Guidant [id number] RV lead  Guidant [id number] Generator  Guidant Prism  09/02/2009 Generator change Medtronic [id number]  SN  [id number]      Erectile dysfunction    Erosive esophagitis    Essential hypertension 11/30/2015   Hemorrhoids    History of TIA (transient ischemic attack) 01/19/2019   HLD (hyperlipidemia)    HTN (hypertension)    Hyperlipidemia    Hypertensive heart disease without CHF 07/31/2011   ICD (implantable cardiac defibrillator) in place    ICD dual chamber in situ    Ischemic cardiomyopathy    EF 40% June 2013    Left-sided weakness 12/25/2018   Metabolic syndrome    Moderate nonproliferative diabetic retinopathy of both eyes without macular edema associated with type 2 diabetes mellitus (HCC) 02/20/2020   Morbid obesity (HCC)    OSA on CPAP 11/05/2014   Osteoarthritis    Posterior vitreous detachment of both eyes 02/20/2020   Presence of automatic (implantable) cardiac defibrillator 11/08/2017   S/P CABG (  coronary artery bypass graft) 11/02/2000   Sleep apnea    Stroke-like symptoms 12/26/2018   Syncope 08/06/2009   Qualifier: Diagnosis of  By: Susette Racer CMA, Jewel     Type 2 diabetes,  uncontrolled, with neuropathy    Has retinopahty and neuropathy     Ventricular tachycardia (paroxysmal) (HCC) 07/14/2016    PAST SURGICAL HISTORY: Past Surgical History:  Procedure Laterality Date   ABDOMINAL EXPLORATION SURGERY     BACK SURGERY     X'3   cardiac bypass     CARDIAC DEFIBRILLATOR PLACEMENT     CARPAL TUNNEL RELEASE     X2, bilateral   CATARACT EXTRACTION     COLONOSCOPY  06/20/2012   Procedure: COLONOSCOPY;  Surgeon: Mardella Layman, MD;  Location: [facility];  Service: Endoscopy;  Laterality: N/A;   DOPPLER ECHOCARDIOGRAPHY  2003   EP IMPLANTABLE DEVICE N/A 07/14/2016   Procedure: ICD Generator Changeout;  Surgeon: Marinus Maw, MD;  Location: [facility] INVASIVE CV LAB;  Service: Cardiovascular;  Laterality: N/A;   ESOPHAGOGASTRODUODENOSCOPY  06/20/2012   Procedure: ESOPHAGOGASTRODUODENOSCOPY (EGD);  Surgeon: Mardella Layman, MD;  Location: [facility];  Service: Endoscopy;  Laterality: N/A;   EYE SURGERY     LAPAROTOMY     RETINOPATHY SURGERY Bilateral    rotator cuff surgery     left    FAMILY HISTORY: Family History  Problem Relation Age of Onset   Diabetes Brother    Heart disease Father    Heart disease Mother    Diabetes Mother    Diabetes Brother    Diabetes Brother    Cancer Brother        baldder   Heart disease Brother    Diabetes Sister    Diabetes Sister    Kidney disease Sister        dialysis   Diabetes Sister    Diabetes Sister    Diabetes Sister    Throat cancer Paternal Uncle     SOCIAL HISTORY: Social History   Socioeconomic History   Marital status: Married    Spouse name: Alice   Number of children: 4   Years of education: 11   Highest education level: 11th grade  Occupational History   Occupation: Retired from [employer] business    Employer: RETIRED  Tobacco Use   Smoking status: Never   Smokeless tobacco: Never  Building services engineer Use: Never used  Substance and Sexual Activity   Alcohol use: No     Alcohol/week: 0.0 standard drinks of alcohol   Drug use: No   Sexual activity: Yes  Other Topics Concern   Not on file  Social History Narrative   Lives at home with wife   Right handed, Married, 4 kids from previous marriage.  Retired.     Caffeine 1 cup daily.   Social Determinants of Health   Financial Resource Strain: Low Risk  (12/14/2022)   Overall Financial Resource Strain (CARDIA)    Difficulty of Paying Living Expenses: Not hard at all  Food Insecurity: No Food Insecurity (12/14/2022)   Hunger Vital Sign    Worried About Running Out of Food in the Last Year: Never true    Ran Out of Food in the Last Year: Never true  Transportation Needs: No Transportation Needs (12/14/2022)   PRAPARE - Administrator, Civil Service (Medical): No    Lack of Transportation (Non-Medical): No  Physical Activity: Insufficiently Active (12/14/2022)   Exercise Vital Sign  Days of Exercise per Week: 3 days    Minutes of Exercise per Session: 10 min  Stress: No Stress Concern Present (12/14/2022)   [employer] of Occupational Health - Occupational Stress Questionnaire    Feeling of Stress : Not at all  Social Connections: Moderately Integrated (12/14/2022)   Social Connection and Isolation Panel [NHANES]    Frequency of Communication with Friends and Family: More than three times a week    Frequency of Social Gatherings with Friends and Family: More than three times a week    Attends Religious Services: More than 4 times per year    Active Member of [employer] or Organizations: No    Attends Banker Meetings: Never    Marital Status: Married  Catering manager Violence: Not At Risk (12/14/2022)   Humiliation, Afraid, Rape, and Kick questionnaire    Fear of Current or Ex-Partner: No    Emotionally Abused: No    Physically Abused: No    Sexually Abused: No     PHYSICAL EXAM  Vitals:   01/20/23 1033  BP: (!) 142/79  Pulse: 61  Weight: 214 lb 8 oz (97.3 kg)   Height: 5\' 4"  (1.626 m)    Body mass index is 36.82 kg/m.  Generalized: Well developed, in no acute distress  Cardiology: normal rate and rhythm, no murmur noted, pacemaker noted  Respiratory: clear to auscultation bilaterally  Neurological examination  Mentation: Alert oriented to time, place, history taking. Follows all commands speech and language fluent Cranial nerve II-XII: Pupils were equal round reactive to light. Extraocular movements were full, visual field were full  Motor: The motor testing reveals 5 over 5 strength of all 4 extremities. Good symmetric motor tone is noted throughout.  Gait and station: Gait is wide, stable with cane    DIAGNOSTIC DATA (LABS, IMAGING, TESTING) - I reviewed patient records, labs, notes, testing and imaging myself where available.     03/23/2018   11:07 AM 03/18/2017    8:36 AM 03/16/2016    2:01 PM  MMSE - Mini Mental State Exam  Orientation to time 5 5 5   Orientation to Place 5 5 5   Registration 3 3 3   Attention/ Calculation 0 3 4  Attention/Calculation-comments not attempted    Recall 2 3 3   Language- name 2 objects 2 2 2   Language- repeat 1 1 1   Language- follow 3 step command 3 3 3   Language- read & follow direction 1 1 1   Write a sentence 1 1 1   Copy design 1 1 1   Total score 24 28 29      Lab Results  Component Value Date   WBC 8.0 10/14/2022   HGB 13.9 10/14/2022   HCT 40.8 10/14/2022   MCV 88 10/14/2022   PLT 200 10/14/2022      Component Value Date/Time   NA 136 10/14/2022 1014   K 4.8 10/14/2022 1014   CL 97 10/14/2022 1014   CO2 24 10/14/2022 1014   GLUCOSE 163 (H) 10/14/2022 1014   GLUCOSE 93 07/22/2022 0312   BUN 45 (H) 10/14/2022 1014   CREATININE 2.25 (H) 10/14/2022 1014   CREATININE 2.04 (H) 07/03/2016 1028   CALCIUM 8.9 10/14/2022 1014   PROT 6.7 10/14/2022 1014   ALBUMIN 4.2 10/14/2022 1014   AST 23 10/14/2022 1014   ALT 22 10/14/2022 1014   ALKPHOS 107 10/14/2022 1014   BILITOT 0.5 10/14/2022  1014   GFRNONAA 20 (L) 07/22/2022 0312   GFRAA 43 (L)  05/22/2020 1142   Lab Results  Component Value Date   CHOL 138 10/14/2022   HDL 38 (L) 10/14/2022   LDLCALC 67 10/14/2022   TRIG 199 (H) 10/14/2022   CHOLHDL 3.6 10/14/2022   Lab Results  Component Value Date   HGBA1C 7.5 (H) 10/14/2022   Lab Results  Component Value Date   VITAMINB12 556 12/25/2018   Lab Results  Component Value Date   TSH 1.421 12/25/2018     ASSESSMENT AND PLAN 89 y.o. year old male  has a past medical history of Adenomatous colon polyp (2006), CAD (coronary artery disease), Calcium oxalate renal stones, Cardiomyopathy, Cataract, Cataract, Cerebral embolism with cerebral infarction (12/26/2018), Chronic systolic heart failure (HCC), CKD (chronic kidney disease) stage 4, GFR 15-29 ml/min (HCC) (11/08/2017), Claudication in peripheral vascular disease (HCC) (07/23/2020), Dehydration (07/2022), Diabetes (HCC), Diabetic peripheral neuropathy (HCC) (01/31/2015), Dual implantable cardioverter-defibrillator in situ, Erectile dysfunction, Erosive esophagitis, Essential hypertension (11/30/2015), Hemorrhoids, History of TIA (transient ischemic attack) (01/19/2019), HLD (hyperlipidemia), HTN (hypertension), Hyperlipidemia, Hypertensive heart disease without CHF (07/31/2011), ICD (implantable cardiac defibrillator) in place, ICD dual chamber in situ, Ischemic cardiomyopathy, Left-sided weakness (12/25/2018), Metabolic syndrome, Moderate nonproliferative diabetic retinopathy of both eyes without macular edema associated with type 2 diabetes mellitus (HCC) (02/20/2020), Morbid obesity (HCC), OSA on CPAP (11/05/2014), Osteoarthritis, Posterior vitreous detachment of both eyes (02/20/2020), Presence of automatic (implantable) cardiac defibrillator (11/08/2017), S/P CABG (coronary artery bypass graft) (11/02/2000), Sleep apnea, Stroke-like symptoms (12/26/2018), Syncope (08/06/2009), Type 2 diabetes, uncontrolled, with neuropathy,  and Ventricular tachycardia (paroxysmal) (HCC) (07/14/2016). here with     ICD-10-CM   1. OSA on CPAP  G47.33 For home use only DME continuous positive airway pressure (CPAP)         Javell H Sortor is doing well on CPAP therapy. Compliance report reveals excellent compliance. He does have an air leak but AHI is very well managed. He will continue to monitor for worsening leak. He was encouraged to continue using CPAP nightly and for greater than 4 hours each night. We will update supply orders as indicated. Risks of untreated sleep apnea review and education materials provided. He was encouraged to continue close follow up with PCP and care team. Stroke prevention education provided. Fall prevention advised. Healthy lifestyle habits encouraged. He will follow up in 1 year, sooner if needed. He verbalizes understanding and agreement with this plan.    Orders Placed This Encounter  Procedures   For home use only DME continuous positive airway pressure (CPAP)    Supplies    Order Specific Question:   Length of Need    Answer:   Lifetime    Order Specific Question:   Patient has OSA or probable OSA    Answer:   Yes    Order Specific Question:   Is the patient currently using CPAP in the home    Answer:   Yes    Order Specific Question:   Settings    Answer:   Other see comments    Order Specific Question:   CPAP supplies needed    Answer:   Mask, headgear, cushions, filters, heated tubing and water chamber     No orders of the defined types were placed in this encounter.     Shawnie Dapper, FNP-C 01/20/2023, 10:58 AM [facility] [address] [place], [place] [address] [phone]

## 2023-01-19 NOTE — Patient Instructions (Signed)

## 2023-01-20 ENCOUNTER — Encounter: Payer: Self-pay | Admitting: Family Medicine

## 2023-01-20 ENCOUNTER — Ambulatory Visit (INDEPENDENT_AMBULATORY_CARE_PROVIDER_SITE_OTHER): Payer: Medicare Other

## 2023-01-20 ENCOUNTER — Ambulatory Visit: Payer: Medicare Other | Admitting: Family Medicine

## 2023-01-20 VITALS — BP 142/79 | HR 61 | Ht 64.0 in | Wt 214.5 lb

## 2023-01-20 DIAGNOSIS — I255 Ischemic cardiomyopathy: Secondary | ICD-10-CM

## 2023-01-20 DIAGNOSIS — G4733 Obstructive sleep apnea (adult) (pediatric): Secondary | ICD-10-CM

## 2023-01-21 ENCOUNTER — Ambulatory Visit: Payer: Medicare Other | Admitting: Physical Therapy

## 2023-01-21 ENCOUNTER — Encounter: Payer: Self-pay | Admitting: Physical Therapy

## 2023-01-21 DIAGNOSIS — M25512 Pain in left shoulder: Secondary | ICD-10-CM

## 2023-01-21 DIAGNOSIS — M25612 Stiffness of left shoulder, not elsewhere classified: Secondary | ICD-10-CM | POA: Diagnosis not present

## 2023-01-21 DIAGNOSIS — M6281 Muscle weakness (generalized): Secondary | ICD-10-CM | POA: Diagnosis not present

## 2023-01-21 LAB — CUP PACEART REMOTE DEVICE CHECK
Battery Remaining Longevity: 40 mo
Battery Voltage: 2.95 V
Brady Statistic AP VP Percent: 0.09 %
Brady Statistic AP VS Percent: 78.09 %
Brady Statistic AS VP Percent: 0.02 %
Brady Statistic AS VS Percent: 21.8 %
Brady Statistic RA Percent Paced: 78.17 %
Brady Statistic RV Percent Paced: 0.11 %
Date Time Interrogation Session: 20240523125306
HighPow Impedance: 51 Ohm
HighPow Impedance: 70 Ohm
Implantable Lead Connection Status: 753985
Implantable Lead Connection Status: 753985
Implantable Lead Implant Date: 20030519
Implantable Lead Implant Date: 20030519
Implantable Lead Location: 753859
Implantable Lead Location: 753860
Implantable Lead Model: 158
Implantable Lead Model: 4087
Implantable Lead Serial Number: 115102
Implantable Lead Serial Number: 159999
Implantable Pulse Generator Implant Date: 20171114
Lead Channel Impedance Value: 4047 Ohm
Lead Channel Impedance Value: 4047 Ohm
Lead Channel Impedance Value: 4047 Ohm
Lead Channel Impedance Value: 437 Ohm
Lead Channel Impedance Value: 513 Ohm
Lead Channel Impedance Value: 513 Ohm
Lead Channel Pacing Threshold Amplitude: 0.875 V
Lead Channel Pacing Threshold Amplitude: 1.125 V
Lead Channel Pacing Threshold Pulse Width: 0.4 ms
Lead Channel Pacing Threshold Pulse Width: 0.4 ms
Lead Channel Sensing Intrinsic Amplitude: 13.125 mV
Lead Channel Sensing Intrinsic Amplitude: 13.125 mV
Lead Channel Sensing Intrinsic Amplitude: 3 mV
Lead Channel Sensing Intrinsic Amplitude: 3 mV
Lead Channel Setting Pacing Amplitude: 2 V
Lead Channel Setting Pacing Amplitude: 2.5 V
Lead Channel Setting Pacing Pulse Width: 0.4 ms
Lead Channel Setting Sensing Sensitivity: 0.3 mV
Zone Setting Status: 755011

## 2023-01-21 NOTE — Therapy (Signed)
OUTPATIENT PHYSICAL THERAPY SHOULDER TREATMENT   Patient Name: Manuel Kline MRN: [id number] DOB:[birth year], 89 y.o., male Today's Date: 01/21/2023  END OF SESSION:  PT End of Session - 01/21/23 1525     Visit Number 4    Number of Visits 6    Date for PT Re-Evaluation 01/28/23    PT Start Time 1430    PT Stop Time 1515    PT Time Calculation (min) 45 min    Activity Tolerance Patient tolerated treatment well    Behavior During Therapy [facility] for tasks assessed/performed            Past Medical History:  Diagnosis Date   Adenomatous colon polyp 2006   CAD (coronary artery disease)    Calcium oxalate renal stones    Cardiomyopathy    Cataract    Cataract    Cerebral embolism with cerebral infarction 12/26/2018   Chronic systolic heart failure (HCC)         CKD (chronic kidney disease) stage 4, GFR 15-29 ml/min (HCC) 11/08/2017   Claudication in peripheral vascular disease (HCC) 07/23/2020   Dehydration 07/2022   Diabetes (HCC)    Diabetic peripheral neuropathy (HCC) 01/31/2015   Dual implantable cardioverter-defibrillator in situ    01/16/2002 Dr. Ladona Ridgel  RA lead  Guidant [id number] RV lead  Guidant [id number] Generator  Guidant Prism  09/02/2009 Generator change Medtronic [id number]  SN  [id number]      Erectile dysfunction    Erosive esophagitis    Essential hypertension 11/30/2015   Hemorrhoids    History of TIA (transient ischemic attack) 01/19/2019   HLD (hyperlipidemia)    HTN (hypertension)    Hyperlipidemia    Hypertensive heart disease without CHF 07/31/2011   ICD (implantable cardiac defibrillator) in place    ICD dual chamber in situ    Ischemic cardiomyopathy    EF 40% June 2013    Left-sided weakness 12/25/2018   Metabolic syndrome    Moderate nonproliferative diabetic retinopathy of both eyes without macular edema associated with type 2 diabetes mellitus (HCC) 02/20/2020   Morbid obesity (HCC)    OSA on CPAP 11/05/2014   Osteoarthritis    Posterior  vitreous detachment of both eyes 02/20/2020   Presence of automatic (implantable) cardiac defibrillator 11/08/2017   S/P CABG (coronary artery bypass graft) 11/02/2000   Sleep apnea    Stroke-like symptoms 12/26/2018   Syncope 08/06/2009   Qualifier: Diagnosis of  By: Susette Racer CMA, Jewel     Type 2 diabetes, uncontrolled, with neuropathy    Has retinopahty and neuropathy     Ventricular tachycardia (paroxysmal) (HCC) 07/14/2016   Past Surgical History:  Procedure Laterality Date   ABDOMINAL EXPLORATION SURGERY     BACK SURGERY     X'3   cardiac bypass     CARDIAC DEFIBRILLATOR PLACEMENT     CARPAL TUNNEL RELEASE     X2, bilateral   CATARACT EXTRACTION     COLONOSCOPY  06/20/2012   Procedure: COLONOSCOPY;  Surgeon: Mardella Layman, MD;  Location: [facility] ENDOSCOPY;  Service: Endoscopy;  Laterality: N/A;   DOPPLER ECHOCARDIOGRAPHY  2003   EP IMPLANTABLE DEVICE N/A 07/14/2016   Procedure: ICD Generator Changeout;  Surgeon: Marinus Maw, MD;  Location: [facility] INVASIVE CV LAB;  Service: Cardiovascular;  Laterality: N/A;   ESOPHAGOGASTRODUODENOSCOPY  06/20/2012   Procedure: ESOPHAGOGASTRODUODENOSCOPY (EGD);  Surgeon: Mardella Layman, MD;  Location: [facility];  Service: Endoscopy;  Laterality: N/A;   EYE SURGERY  LAPAROTOMY     RETINOPATHY SURGERY Bilateral    rotator cuff surgery     left   Patient Active Problem List   Diagnosis Date Noted   Porokeratosis 11/04/2022   Pneumatosis of intestines 07/21/2022   Diarrhea 07/21/2022   N&V (nausea and vomiting) 07/21/2022   Hav (hallux abducto valgus), left 05/06/2022   Callus 10/29/2021   Glaucoma suspect with open angle 04/02/2021   Pseudophakia of both eyes 08/20/2020   Claudication in peripheral vascular disease (HCC) 07/23/2020   Sleep apnea    Osteoarthritis    Metabolic syndrome    Erosive esophagitis    Erectile dysfunction    Calcium oxalate renal stones    Moderate nonproliferative diabetic retinopathy of both eyes  without macular edema associated with type 2 diabetes mellitus (HCC) 02/20/2020   Posterior vitreous detachment of both eyes 02/20/2020   History of TIA (transient ischemic attack) 01/19/2019   Cerebral embolism with cerebral infarction 12/26/2018   CKD (chronic kidney disease) stage 4, GFR 15-29 ml/min (HCC) 11/08/2017   ICD (implantable cardioverter-defibrillator) in place 11/08/2017   Ventricular tachycardia (paroxysmal) (HCC) 07/14/2016   Essential hypertension 11/30/2015   Diabetic peripheral neuropathy (HCC) 01/31/2015   OSA on CPAP 11/05/2014   CAD (coronary artery disease)    Dual implantable cardioverter-defibrillator in situ    Hyperlipidemia    Type 2 diabetes mellitus with other specified complication (HCC)    Morbid obesity (HCC)    Ischemic cardiomyopathy    Chronic systolic heart failure (HCC)    S/P CABG (coronary artery bypass graft) 11/02/2000   REFERRING PROVIDER: Jannifer Rodney   REFERRING DIAG: Acute pain of left shoulder.  THERAPY DIAG:  Stiffness of left shoulder, not elsewhere classified  Muscle weakness (generalized)  Acute pain of left shoulder  Rationale for Evaluation and Treatment: Rehabilitation  ONSET DATE: Ongoing but worse since a month+ ago.  SUBJECTIVE:                                                                                                                                                                                      SUBJECTIVE STATEMENT: Pt arriving today with 7-8/10 in his left shoulder with movement.   PERTINENT HISTORY: DM, Pacemaker, lumbar surgery x 3.  PAIN:  Are you having pain? Yes: NPRS scale: 7-8/10 Pain location: Left shoulder. Pain description: Ache, sore, throb, shooting. Aggravating factors: As above. Relieving factors: As above.  PRECAUTIONS: ICD/Pacemaker.  Needs cane at all times.  WEIGHT BEARING RESTRICTIONS: No.  FALLS:  Has patient fallen in last 6 months? Yes. Number of falls 2.  PATIENT  GOALS:Lift arm with less pain.  NEXT MD VISIT:  OBJECTIVE:   DIAGNOSTIC FINDINGS: X-RAY:  12/24/22.  FINDINGS: AC joint degenerative change with superiorly and inferiorly directed osteophytes. No fracture or dislocation. Spurring along the undersurface of the acromion. No other bony or soft tissue abnormalities are noted.   IMPRESSION: AC joint degenerative changes with superiorly and inferiorly directed osteophytes. Spurring along the undersurface of the acromion.  PATIENT SURVEYS:  FOTO 38.  POSTURE: Forward head, rounded shoulder, possible "popeye" muscle on left.  UPPER EXTREMITY ROM:  Active left shoulder flexion to 40 degrees but passively through a full range of motion.  ER is 30 degrees active and 45 degrees passive.  Active abduction to 60 degrees.  Behind back very limited to left hip.  01/21/23:  Left shoulder flexion in supine: active: 85 deg, passive: 120 deg Left shoulder ER in supine shoulder abducted 45 degrees: active: 50 deg, passive 60 degrees   UPPER EXTREMITY MMT: Patient's left shoulder brought passively into flexion and abduction to 90 degrees and he was easily able to hold against gravity.  He is weak into left shoulder ER graded at 3- to 3/5.  IR strength is a solid 4/5.  SHOULDER SPECIAL TESTS: Some pain reproduction with left shoulder impingement testing.  (-) Drop Arm test.  PALPATION:  Palpable pain over left ACJ and a great deal of crepitus with active and passive movement of his left shoulder.  He had pain complaint sin his posterior cuff region as well.  Treatment Log: 01/21/23:  TherEx:  Pulleys: flexion x 4 minutes Wall ladder x 5 flexion (max #17) UE ranger in sitting: flexion/extension, circles clockwise / counter clockwise x 1 minute each direction Table slides: flexion, scaption x 15 each direction Bil ER: elbows at 90 degrees pulling Green TB out x 15 ("money exercise") Supine AAROM: using bar x 15  Manual:  AP GH grade 2-3 mobs,  active trigger point release to left anterior middle deltoids Modalities:  Moist heat x 5 minutes to left shoulder                                 01/14/23  EXERCISE LOG  Exercise Repetitions and Resistance Comments  Nustep X15 min L1  For UE motion  Wall ladder X4 reps  Max 17  UE ranger Flex/ext x3 min, circles x3 min Greater pain with flex/ext  Isometrics Flex, ER, IR, abduction, ext x10 reps 3 sec holds each Pain throughout session3000  AAROM upper cut X15 reps  L hand becoming numb  AAROM bicep curls X20 reps   Partial short lever abduction X15 reps   AAROM ER X20 reps Reported burning, stinging   Blank cell = exercise not performed today   Manual Therapy Soft Tissue Mobilization: L deltoids, posterior cuff, reduce muscle guarding    PATIENT EDUCATION: Education details: Discussed therapeutic interventions with hopes of improving function and decreasing pain. Person educated: Patient Education method: Explanation Education comprehension: verbalized understanding  HOME EXERCISE PROGRAM:  ASSESSMENT:  CLINICAL IMPRESSION: Pt arriving to therapy reporting 7-8/10 in his left shoulder. Pt also reporting new fall on Monday. Pt stating he was not treated for injuries only cut/scrapes noted on his Rt elbow. Pt's treatment was limited by pt's pain in his left shoulder. Pt tolerating passive ROM and manual therapy well with passive left shoulder flexion to 120 degrees in supine. Continue skilled PT to maximize pt's function.   OBJECTIVE IMPAIRMENTS: decreased activity tolerance, decreased ROM, decreased strength, increased  muscle spasms, postural dysfunction, and pain.   ACTIVITY LIMITATIONS: carrying, lifting, dressing, and reach over head  PARTICIPATION LIMITATIONS: meal prep, cleaning, laundry, and yard work  PERSONAL FACTORS: 1 comorbidity: left ACJ degenerative changes  are also affecting patient's functional outcome.   REHAB POTENTIAL: Fair .  CLINICAL DECISION MAKING:  Evolving/moderate complexity  EVALUATION COMPLEXITY: Low  GOALS: SHORT TERM GOALS: Target date: 01/14/23  Ind with an initial HEP. Goal status: On-going 01/21/23   LONG TERM GOALS: Target date: 01/28/23.  Ind with advanced HEP.  Goal status: INITIAL  2.  Active left shoulder flexion to 125 degrees.  Goal status: INITIAL  3.  Improve left shoulder ER strength to 4/5.  Goal status: INITIAL  4.  Perform ADL's with left shoulder pain not > 4/10.  Goal status: INITIAL  PLAN:  PT FREQUENCY: 1-2 times a week.  PT DURATION: 6 visits.  PLANNED INTERVENTIONS: Therapeutic exercises, Therapeutic activity, Patient/Family education, Self Care, Joint mobilization, Dry Needling, Cryotherapy, Moist heat, and Manual therapy  PLAN FOR NEXT SESSION: Pulleys, AAROM, seated UE ranger, rhy stabs, isometrics, STW/M as needed.   Narda Amber, PT, MPT 01/21/23 3:26 PM   01/21/2023, 3:26 PM

## 2023-01-27 ENCOUNTER — Other Ambulatory Visit: Payer: Self-pay | Admitting: Family Medicine

## 2023-01-27 ENCOUNTER — Ambulatory Visit (INDEPENDENT_AMBULATORY_CARE_PROVIDER_SITE_OTHER): Payer: Medicare Other | Admitting: Family Medicine

## 2023-01-27 ENCOUNTER — Encounter: Payer: Self-pay | Admitting: Family Medicine

## 2023-01-27 VITALS — BP 137/74 | HR 72 | Ht 64.0 in | Wt 214.0 lb

## 2023-01-27 DIAGNOSIS — E1169 Type 2 diabetes mellitus with other specified complication: Secondary | ICD-10-CM

## 2023-01-27 DIAGNOSIS — I130 Hypertensive heart and chronic kidney disease with heart failure and stage 1 through stage 4 chronic kidney disease, or unspecified chronic kidney disease: Secondary | ICD-10-CM | POA: Diagnosis not present

## 2023-01-27 DIAGNOSIS — E113393 Type 2 diabetes mellitus with moderate nonproliferative diabetic retinopathy without macular edema, bilateral: Secondary | ICD-10-CM | POA: Diagnosis not present

## 2023-01-27 DIAGNOSIS — Z794 Long term (current) use of insulin: Secondary | ICD-10-CM

## 2023-01-27 DIAGNOSIS — R296 Repeated falls: Secondary | ICD-10-CM

## 2023-01-27 DIAGNOSIS — E1142 Type 2 diabetes mellitus with diabetic polyneuropathy: Secondary | ICD-10-CM | POA: Diagnosis not present

## 2023-01-27 DIAGNOSIS — N184 Chronic kidney disease, stage 4 (severe): Secondary | ICD-10-CM

## 2023-01-27 DIAGNOSIS — E782 Mixed hyperlipidemia: Secondary | ICD-10-CM

## 2023-01-27 DIAGNOSIS — I10 Essential (primary) hypertension: Secondary | ICD-10-CM

## 2023-01-27 DIAGNOSIS — I5022 Chronic systolic (congestive) heart failure: Secondary | ICD-10-CM | POA: Diagnosis not present

## 2023-01-27 LAB — CMP14+EGFR
ALT: 23 IU/L (ref 0–44)
AST: 28 IU/L (ref 0–40)
Albumin/Globulin Ratio: 1.7 (ref 1.2–2.2)
Albumin: 4.3 g/dL (ref 3.7–4.7)
Alkaline Phosphatase: 112 IU/L (ref 44–121)
BUN/Creatinine Ratio: 15 (ref 10–24)
BUN: 33 mg/dL — ABNORMAL HIGH (ref 8–27)
Bilirubin Total: 0.5 mg/dL (ref 0.0–1.2)
CO2: 22 mmol/L (ref 20–29)
Calcium: 9.3 mg/dL (ref 8.6–10.2)
Chloride: 100 mmol/L (ref 96–106)
Creatinine, Ser: 2.13 mg/dL — ABNORMAL HIGH (ref 0.76–1.27)
Globulin, Total: 2.5 g/dL (ref 1.5–4.5)
Glucose: 90 mg/dL (ref 70–99)
Potassium: 4.7 mmol/L (ref 3.5–5.2)
Sodium: 138 mmol/L (ref 134–144)
Total Protein: 6.8 g/dL (ref 6.0–8.5)
eGFR: 29 mL/min/{1.73_m2} — ABNORMAL LOW (ref >59)

## 2023-01-27 LAB — CBC WITH DIFFERENTIAL/PLATELET
Basophils Absolute: 0 10*3/uL (ref 0.0–0.2)
Basos: 0 %
EOS (ABSOLUTE): 0.2 10*3/uL (ref 0.0–0.4)
Eos: 2 %
Hematocrit: 40.3 % (ref 37.5–51.0)
Hemoglobin: 13.8 g/dL (ref 13.0–17.7)
Immature Grans (Abs): 0 10*3/uL (ref 0.0–0.1)
Immature Granulocytes: 0 %
Lymphocytes Absolute: 2 10*3/uL (ref 0.7–3.1)
Lymphs: 24 %
MCH: 30.1 pg (ref 26.6–33.0)
MCHC: 34.2 g/dL (ref 31.5–35.7)
MCV: 88 fL (ref 79–97)
Monocytes Absolute: 0.6 10*3/uL (ref 0.1–0.9)
Monocytes: 7 %
Neutrophils Absolute: 5.5 10*3/uL (ref 1.4–7.0)
Neutrophils: 67 %
Platelets: 192 10*3/uL (ref 150–450)
RBC: 4.59 x10E6/uL (ref 4.14–5.80)
RDW: 14.5 % (ref 11.6–15.4)
WBC: 8.3 10*3/uL (ref 3.4–10.8)

## 2023-01-27 LAB — LIPID PANEL
Chol/HDL Ratio: 2.6 ratio (ref 0.0–5.0)
Cholesterol, Total: 122 mg/dL (ref 100–199)
HDL: 47 mg/dL (ref >39)
LDL Chol Calc (NIH): 52 mg/dL (ref 0–99)
Triglycerides: 131 mg/dL (ref 0–149)
VLDL Cholesterol Cal: 23 mg/dL (ref 5–40)

## 2023-01-27 LAB — BAYER DCA HB A1C WAIVED: HB A1C (BAYER DCA - WAIVED): 7.8 % — ABNORMAL HIGH (ref 4.8–5.6)

## 2023-01-27 MED ORDER — INSULIN REGULAR HUMAN 100 UNIT/ML IJ SOLN: 20.0000 [IU] | Freq: Three times a day (TID) | INTRAMUSCULAR | 3 refills | Status: DC

## 2023-01-27 MED ORDER — LANTUS SOLOSTAR 100 UNIT/ML ~~LOC~~ SOPN: 20.0000 [IU] | PEN_INJECTOR | Freq: Two times a day (BID) | SUBCUTANEOUS | 11 refills | Status: DC

## 2023-01-27 MED ORDER — BASAGLAR KWIKPEN 100 UNIT/ML ~~LOC~~ SOPN: 20.0000 [IU] | PEN_INJECTOR | Freq: Two times a day (BID) | SUBCUTANEOUS | 5 refills | Status: DC

## 2023-01-27 NOTE — Addendum Note (Signed)
Addended by: Julious Payer D on: 01/27/2023 04:52 PM   Modules accepted: Orders

## 2023-01-27 NOTE — Telephone Encounter (Signed)
  Name from pharmacy: BASAGLAR 100UNIT    INJ        Will file in chart as: Insulin Glargine (BASAGLAR KWIKPEN) 100 UNIT/ML    Pharmacy comment: NDC not covered by insurance.  lantus, tresiba, toujeo are covered.

## 2023-01-27 NOTE — Progress Notes (Addendum)
BP 137/74   Pulse 72   Ht 5\' 4"  (1.626 m)   Wt 214 lb (97.1 kg)   SpO2 99%   BMI 36.73 kg/m    Subjective:   Patient ID: Manuel Kline, male    DOB: 1931-12-10, 89 y.o.   MRN: [id number]  HPI: Manuel Kline is a 89 y.o. male presenting on 01/27/2023 for Medical Management of Chronic Issues and Diabetes   HPI Type 2 diabetes mellitus Patient comes in today for recheck of his diabetes. Patient has been currently taking Trulicity and Hospital doctor and Humalog are and [place]. Patient is currently on an ACE inhibitor/ARB. Patient has not seen an ophthalmologist this year. Patient denies any new issues with their feet. The symptom started onset as an adult hypertension and hyperlipidemia ARE RELATED TO DM   Hypertension and CHF Patient is currently on benazepril and furosemide and metoprolol, and their blood pressure today is 137/74. Patient denies any lightheadedness or dizziness. Patient denies headaches, blurred vision, chest pains, shortness of breath, or weakness. Denies any side effects from medication and is content with current medication.   Hyperlipidemia Patient is coming in for recheck of his hyperlipidemia. The patient is currently taking Crestor and fish oils. They deny any issues with myalgias or history of liver damage from it. They deny any focal numbness or weakness or chest pain.   Is having recurrent falls and has a skin tear on his right arm that is healing.  He is seeing physical therapy for his shoulder but not for strengthening.  We will do referral to physical therapy for him  Relevant past medical, surgical, family and social history reviewed and updated as indicated. Interim medical history since our last visit reviewed. Allergies and medications reviewed and updated.  Review of Systems  Constitutional:  Negative for chills and fever.  Eyes:  Negative for visual disturbance.  Respiratory:  Negative for shortness of breath and wheezing.   Cardiovascular:  Negative for  chest pain and leg swelling.  Musculoskeletal:  Negative for back pain and gait problem.  Skin:  Negative for rash.  Neurological:  Positive for weakness. Negative for dizziness and light-headedness.  All other systems reviewed and are negative.   Per HPI unless specifically indicated above   Allergies as of 01/27/2023   No Known Allergies      Medication List        Accurate as of Jan 27, 2023 11:15 AM. If you have any questions, ask your nurse or doctor.          allopurinol 300 MG tablet Commonly known as: ZYLOPRIM Take 1/2 (one-half) tablet by mouth once daily   ascorbic acid 500 MG tablet Commonly known as: VITAMIN C Take 500 mg by mouth daily.   B-D UF III MINI PEN NEEDLES 31G X 5 MM Misc Generic drug: Insulin Pen Needle 1 each by Does not apply route 4 (four) times daily.   Basaglar KwikPen 100 UNIT/ML Inject 20 Units into the skin 2 (two) times daily. What changed:  how much to take when to take this Changed by: Elige Radon Mekhia Brogan, MD   benazepril 40 MG tablet Commonly known as: LOTENSIN Take 1 tablet (40 mg total) by mouth daily.   clopidogrel 75 MG tablet Commonly known as: PLAVIX Take 1 tablet (75 mg total) by mouth daily.   dapagliflozin propanediol 10 MG Tabs tablet Commonly known as: Farxiga Take 1 tablet (10 mg total) by mouth daily before breakfast.   DULoxetine  60 MG capsule Commonly known as: CYMBALTA Take 1 capsule (60 mg total) by mouth daily.   Fish Oil 1000 MG Cpdr Take 1 tablet by mouth daily.   fluticasone 0.05 % cream Commonly known as: CUTIVATE Apply 1 application. topically daily.   furosemide 40 MG tablet Commonly known as: LASIX Take 1 tablet (40 mg total) by mouth 2 (two) times daily.   gabapentin 300 MG capsule Commonly known as: NEURONTIN Take 1 capsule by mouth twice daily   insulin regular 100 units/mL injection Commonly known as: HumuLIN R Inject 0.2 mLs (20 Units total) into the skin 3 (three) times daily  before meals. What changed:  how much to take when to take this Changed by: Elige Radon Garren Greenman, MD   Insulin Syringe-Needle U-100 31G X 5/16" 1 ML Misc Commonly known as: RELION INSULIN SYRINGE 1ML/31G USE TO INJECT INSULIN TWICE DAILY AS DIRECTED. DX: E11.65 What changed:  how much to take how to take this when to take this   metoprolol succinate 50 MG 24 hr tablet Commonly known as: TOPROL-XL TAKE 1 TABLET BY MOUTH ONCE DAILY WITH OR IMMEDIATELY FOLLOWING A MEAL What changed:  how much to take how to take this when to take this   MULTIVITAMIN ADULT PO Take 1 tablet by mouth daily.   nitroGLYCERIN 0.4 MG SL tablet Commonly known as: NITROSTAT Place 1 tablet (0.4 mg total) under the tongue every 5 (five) minutes as needed for chest pain.   polyethylene glycol powder 17 GM/SCOOP powder Commonly known as: GLYCOLAX/MIRALAX Take 17 g by mouth 2 (two) times daily as needed. What changed: reasons to take this   rosuvastatin 5 MG tablet Commonly known as: CRESTOR Take 1 tablet (5 mg total) by mouth daily.   Trulicity 3 MG/0.5ML Sopn Generic drug: Dulaglutide Inject 3 mg as directed once a week. [name] patient assistance program   VITAMIN B 12 PO Take 1,000 mcg by mouth daily.         Objective:   BP 137/74   Pulse 72   Ht 5\' 4"  (1.626 m)   Wt 214 lb (97.1 kg)   SpO2 99%   BMI 36.73 kg/m   Wt Readings from Last 3 Encounters:  01/27/23 214 lb (97.1 kg)  01/20/23 214 lb 8 oz (97.3 kg)  12/22/22 212 lb 6.4 oz (96.3 kg)    Physical Exam Vitals and nursing note reviewed.  Constitutional:      General: He is not in acute distress.    Appearance: He is well-developed. He is not diaphoretic.  Eyes:     General: No scleral icterus.    Conjunctiva/sclera: Conjunctivae normal.  Neck:     Thyroid: No thyromegaly.  Cardiovascular:     Rate and Rhythm: Normal rate and regular rhythm.     Heart sounds: Normal heart sounds. No murmur heard. Pulmonary:      Effort: Pulmonary effort is normal. No respiratory distress.     Breath sounds: Normal breath sounds. No wheezing.  Musculoskeletal:        General: No swelling. Normal range of motion.     Cervical back: Neck supple.  Lymphadenopathy:     Cervical: No cervical adenopathy.  Skin:    General: Skin is warm and dry.     Findings: No rash.  Neurological:     Mental Status: He is alert and oriented to person, place, and time.     Coordination: Coordination normal.  Psychiatric:  Behavior: Behavior normal.    Foot/Ankle Musculoskeletal Exam  General     Constitutional: well-developed   Scleral icterus: no   Neurological: alert     Assessment & Plan:   Problem List Items Addressed This Visit       Cardiovascular and Mediastinum   Chronic systolic heart failure (HCC) (Chronic)   Essential hypertension     Endocrine   Type 2 diabetes mellitus with other specified complication (HCC) - Primary   Relevant Medications   Insulin Glargine (BASAGLAR KWIKPEN) 100 UNIT/ML   insulin regular (HUMULIN R) 100 units/mL injection   Other Relevant Orders   CBC with Differential/Platelet   CMP14+EGFR   Lipid panel   Bayer DCA Hb A1c Waived   Diabetic peripheral neuropathy (HCC)   Relevant Medications   Insulin Glargine (BASAGLAR KWIKPEN) 100 UNIT/ML   insulin regular (HUMULIN R) 100 units/mL injection   Moderate nonproliferative diabetic retinopathy of both eyes without macular edema associated with type 2 diabetes mellitus (HCC)   Relevant Medications   Insulin Glargine (BASAGLAR KWIKPEN) 100 UNIT/ML   insulin regular (HUMULIN R) 100 units/mL injection     Genitourinary   CKD (chronic kidney disease) stage 4, GFR 15-29 ml/min (HCC)   Relevant Orders   CBC with Differential/Platelet   CMP14+EGFR   Lipid panel   Bayer DCA Hb A1c Waived     Other   Hyperlipidemia (Chronic)   Other Visit Diagnoses     Recurrent falls       Relevant Orders   Ambulatory referral to  Physical Therapy       A1c is up and is now 7.8. Patient says that he is taking Basaglar 20 units twice a day, have him continue to do that  Patient says he is taking Novolin R 20 units twice a day and I want him to go up to doing 20 units 3 times a day with meals  Follow up plan: Return in about 3 months (around 04/29/2023), or if symptoms worsen or fail to improve, for Diabetes hypertension and hyperlipidemia.  Counseling provided for all of the vaccine components Orders Placed This Encounter  Procedures   CBC with Differential/Platelet   CMP14+EGFR   Lipid panel   Bayer DCA Hb A1c Waived   Ambulatory referral to Physical Therapy    Arville Care, MD [facility] 01/27/2023, 11:15 AM

## 2023-01-27 NOTE — Telephone Encounter (Signed)
Refill failed. resent °

## 2023-01-27 NOTE — Patient Instructions (Addendum)
A1c is up and is now 7.8. Patient says that he is taking Basaglar 20 units twice a day, have him continue to do that  Patient says he is taking Novolin R 20 units twice a day and I want him to go up to doing 20 units 3 times a day with meals  Recommend for family to come with him to the visits

## 2023-01-28 ENCOUNTER — Encounter: Payer: Self-pay | Admitting: Physical Therapy

## 2023-01-28 ENCOUNTER — Ambulatory Visit: Payer: Medicare Other | Admitting: Physical Therapy

## 2023-01-28 DIAGNOSIS — M6281 Muscle weakness (generalized): Secondary | ICD-10-CM | POA: Diagnosis not present

## 2023-01-28 DIAGNOSIS — M25512 Pain in left shoulder: Secondary | ICD-10-CM | POA: Diagnosis not present

## 2023-01-28 DIAGNOSIS — M25612 Stiffness of left shoulder, not elsewhere classified: Secondary | ICD-10-CM

## 2023-01-28 NOTE — Therapy (Addendum)
OUTPATIENT PHYSICAL THERAPY SHOULDER TREATMENT   Patient Name: Manuel Kline MRN: [id number] DOB:05-Apr-1932, 89 y.o., male Today's Date: 01/28/2023  END OF SESSION:  PT End of Session - 01/28/23 1508     Visit Number 5    Number of Visits 6    Date for PT Re-Evaluation 01/28/23    Authorization Type FOTO.    PT Start Time 1512    PT Stop Time 1558    PT Time Calculation (min) 46 min    Activity Tolerance Patient tolerated treatment well    Behavior During Therapy [facility] for tasks assessed/performed            Past Medical History:  Diagnosis Date   Adenomatous colon polyp 2006   CAD (coronary artery disease)    Calcium oxalate renal stones    Cardiomyopathy    Cataract    Cataract    Cerebral embolism with cerebral infarction 12/26/2018   Chronic systolic heart failure (HCC)         CKD (chronic kidney disease) stage 4, GFR 15-29 ml/min (HCC) 11/08/2017   Claudication in peripheral vascular disease (HCC) 07/23/2020   Dehydration 07/2022   Diabetes (HCC)    Diabetic peripheral neuropathy (HCC) 01/31/2015   Dual implantable cardioverter-defibrillator in situ    01/16/2002 Dr. Ladona Ridgel  RA lead  Guidant [id number] RV lead  Guidant [id number] Generator  Guidant Prism  09/02/2009 Generator change Medtronic [id number]  SN  [id number]      Erectile dysfunction    Erosive esophagitis    Essential hypertension 11/30/2015   Hemorrhoids    History of TIA (transient ischemic attack) 01/19/2019   HLD (hyperlipidemia)    HTN (hypertension)    Hyperlipidemia    Hypertensive heart disease without CHF 07/31/2011   ICD (implantable cardiac defibrillator) in place    ICD dual chamber in situ    Ischemic cardiomyopathy    EF 40% June 2013    Left-sided weakness 12/25/2018   Metabolic syndrome    Moderate nonproliferative diabetic retinopathy of both eyes without macular edema associated with type 2 diabetes mellitus (HCC) 02/20/2020   Morbid obesity (HCC)    OSA on CPAP 11/05/2014    Osteoarthritis    Posterior vitreous detachment of both eyes 02/20/2020   Presence of automatic (implantable) cardiac defibrillator 11/08/2017   S/P CABG (coronary artery bypass graft) 11/02/2000   Sleep apnea    Stroke-like symptoms 12/26/2018   Syncope 08/06/2009   Qualifier: Diagnosis of  By: Susette Racer CMA, Jewel     Type 2 diabetes, uncontrolled, with neuropathy    Has retinopahty and neuropathy     Ventricular tachycardia (paroxysmal) (HCC) 07/14/2016   Past Surgical History:  Procedure Laterality Date   ABDOMINAL EXPLORATION SURGERY     BACK SURGERY     X'3   cardiac bypass     CARDIAC DEFIBRILLATOR PLACEMENT     CARPAL TUNNEL RELEASE     X2, bilateral   CATARACT EXTRACTION     COLONOSCOPY  06/20/2012   Procedure: COLONOSCOPY;  Surgeon: Mardella Layman, MD;  Location: [facility] ENDOSCOPY;  Service: Endoscopy;  Laterality: N/A;   DOPPLER ECHOCARDIOGRAPHY  2003   EP IMPLANTABLE DEVICE N/A 07/14/2016   Procedure: ICD Generator Changeout;  Surgeon: Marinus Maw, MD;  Location: [facility] INVASIVE CV LAB;  Service: Cardiovascular;  Laterality: N/A;   ESOPHAGOGASTRODUODENOSCOPY  06/20/2012   Procedure: ESOPHAGOGASTRODUODENOSCOPY (EGD);  Surgeon: Mardella Layman, MD;  Location: [facility];  Service: Endoscopy;  Laterality: N/A;  EYE SURGERY     LAPAROTOMY     RETINOPATHY SURGERY Bilateral    rotator cuff surgery     left   Patient Active Problem List   Diagnosis Date Noted   Porokeratosis 11/04/2022   Pneumatosis of intestines 07/21/2022   Diarrhea 07/21/2022   N&V (nausea and vomiting) 07/21/2022   Hav (hallux abducto valgus), left 05/06/2022   Callus 10/29/2021   Glaucoma suspect with open angle 04/02/2021   Pseudophakia of both eyes 08/20/2020   Claudication in peripheral vascular disease (HCC) 07/23/2020   Sleep apnea    Osteoarthritis    Metabolic syndrome    Erosive esophagitis    Erectile dysfunction    Calcium oxalate renal stones    Moderate nonproliferative diabetic  retinopathy of both eyes without macular edema associated with type 2 diabetes mellitus (HCC) 02/20/2020   Posterior vitreous detachment of both eyes 02/20/2020   History of TIA (transient ischemic attack) 01/19/2019   Cerebral embolism with cerebral infarction 12/26/2018   CKD (chronic kidney disease) stage 4, GFR 15-29 ml/min (HCC) 11/08/2017   ICD (implantable cardioverter-defibrillator) in place 11/08/2017   Ventricular tachycardia (paroxysmal) (HCC) 07/14/2016   Essential hypertension 11/30/2015   Diabetic peripheral neuropathy (HCC) 01/31/2015   OSA on CPAP 11/05/2014   CAD (coronary artery disease)    Dual implantable cardioverter-defibrillator in situ    Hyperlipidemia    Type 2 diabetes mellitus with other specified complication (HCC)    Morbid obesity (HCC)    Ischemic cardiomyopathy    Chronic systolic heart failure (HCC)    S/P CABG (coronary artery bypass graft) 11/02/2000   REFERRING PROVIDER: Jannifer Rodney   REFERRING DIAG: Acute pain of left shoulder.  THERAPY DIAG:  Stiffness of left shoulder, not elsewhere classified  Acute pain of left shoulder  Muscle weakness (generalized)  Rationale for Evaluation and Treatment: Rehabilitation  ONSET DATE: Ongoing but worse since a month+ ago.  SUBJECTIVE:                                                                                                                                                                                      SUBJECTIVE STATEMENT: Has fell again when getting up from toilet and fell onto R elbow. Has bandaid applied to R elbow but had abrasion. Still cannot lift LUE any higher.  PERTINENT HISTORY: DM, Pacemaker, lumbar surgery x 3.  PAIN:  Are you having pain? Yes: NPRS scale: 6/10 Pain location: Left shoulder. Pain description: Ache, sore, throb, shooting. Aggravating factors: As above. Relieving factors: As above.  PRECAUTIONS: ICD/Pacemaker.  Needs cane at all times.  WEIGHT BEARING  RESTRICTIONS: No.  FALLS:  Has patient  fallen in last 6 months? Yes. Number of falls 2.  PATIENT GOALS: Lift arm with less pain.  NEXT MD VISIT:   OBJECTIVE:   DIAGNOSTIC FINDINGS: X-RAY:  12/24/22.  FINDINGS: AC joint degenerative change with superiorly and inferiorly directed osteophytes. No fracture or dislocation. Spurring along the undersurface of the acromion. No other bony or soft tissue abnormalities are noted.   IMPRESSION: AC joint degenerative changes with superiorly and inferiorly directed osteophytes. Spurring along the undersurface of the acromion.  PATIENT SURVEYS:  FOTO 12  POSTURE: Forward head, rounded shoulder, possible "popeye" muscle on left.  UPPER EXTREMITY ROM:  Active left shoulder flexion to 40 degrees but passively through a full range of motion.  ER is 30 degrees active and 45 degrees passive.  Active abduction to 60 degrees.  Behind back very limited to left hip.  01/21/23:  Left shoulder flexion in supine: active: 85 deg, passive: 120 deg Left shoulder ER in supine shoulder abducted 45 degrees: active: 50 deg, passive 60 degrees   UPPER EXTREMITY MMT: Patient's left shoulder brought passively into flexion and abduction to 90 degrees and he was easily able to hold against gravity.  He is weak into left shoulder ER graded at 3- to 3/5.  IR strength is a solid 4/5.  SHOULDER SPECIAL TESTS: Some pain reproduction with left shoulder impingement testing.  (-) Drop Arm test.  PALPATION:  Palpable pain over left ACJ and a great deal of crepitus with active and passive movement of his left shoulder.  He had pain complaint sin his posterior cuff region as well.                       5/30   EXERCISE LOG  Exercise Repetitions and Resistance Comments  Nustep L3 x15 min   Pulley  X3 min VC to avoid excessive pain  UE ranger X3 min flex/ext   Wall ladder  X8 reps (max at 10)  Limitation due to finger mobility  AAROM ER X20 reps seated   AAROM  protraction X15 reps Reported pain in posterior shoulder   Blank cell = exercise not performed today   Manual Therapy Soft Tissue Mobilization: L deltoids, supraspinatus, to reduce tightness and pain    PATIENT EDUCATION: Education details: Discussed therapeutic interventions with hopes of improving function and decreasing pain. Person educated: Patient Education method: Explanation Education comprehension: verbalized understanding  HOME EXERCISE PROGRAM:  ASSESSMENT:  CLINICAL IMPRESSION: Patient presented in clinic with reports of continued L shoulder flexion limitation as well as pain. Patient states that he tries not to complete activity with LUE if possible. Patient required constant close supervision due to history of falls and imbalance. Patient limited with any shoulder flexion either AROM or AAROM. Increased tone palpable in L deltoids and posterior shoulder along supraspinatus. Limited goal progression due to nature of injury and extent of injury.  OBJECTIVE IMPAIRMENTS: decreased activity tolerance, decreased ROM, decreased strength, increased muscle spasms, postural dysfunction, and pain.   ACTIVITY LIMITATIONS: carrying, lifting, dressing, and reach over head  PARTICIPATION LIMITATIONS: meal prep, cleaning, laundry, and yard work  PERSONAL FACTORS: 1 comorbidity: left ACJ degenerative changes  are also affecting patient's functional outcome.   REHAB POTENTIAL: Fair .  CLINICAL DECISION MAKING: Evolving/moderate complexity  EVALUATION COMPLEXITY: Low  GOALS: SHORT TERM GOALS: Target date: 01/14/23  Ind with an initial HEP. Goal status: UNABLE TO ASSESS  LONG TERM GOALS: Target date: 01/28/23.  Ind with advanced HEP.  Goal status:  UNABLE TO ASSESS  2.  Active left shoulder flexion to 125 degrees.  Goal status: NOT MET  3.  Improve left shoulder ER strength to 4/5.  Goal status: NOT TESTED  4.  Perform ADL's with left shoulder pain not > 4/10.  Goal status:  NOT MET  PLAN:  PT FREQUENCY: 1-2 times a week.  PT DURATION: 6 visits.  PLANNED INTERVENTIONS: Therapeutic exercises, Therapeutic activity, Patient/Family education, Self Care, Joint mobilization, Dry Needling, Cryotherapy, Moist heat, and Manual therapy  PLAN FOR NEXT SESSION: DC  Marvell Fuller, PTA 01/28/23 4:19 PM     PHYSICAL THERAPY DISCHARGE SUMMARY  Visits from Start of Care: 5.  Current functional level related to goals / functional outcomes: See above.   Remaining deficits: Continued pain and loss of shoulder function.   Education / Equipment: HEP.   Patient agrees to discharge. Patient goals were not met. Patient is being discharged due to lack of progress.    Italy Applegate MPT

## 2023-02-01 ENCOUNTER — Encounter: Payer: Self-pay | Admitting: Podiatry

## 2023-02-01 ENCOUNTER — Ambulatory Visit (INDEPENDENT_AMBULATORY_CARE_PROVIDER_SITE_OTHER): Payer: Medicare Other | Admitting: Podiatry

## 2023-02-01 DIAGNOSIS — Q828 Other specified congenital malformations of skin: Secondary | ICD-10-CM

## 2023-02-01 DIAGNOSIS — M79674 Pain in right toe(s): Secondary | ICD-10-CM | POA: Diagnosis not present

## 2023-02-01 DIAGNOSIS — E0843 Diabetes mellitus due to underlying condition with diabetic autonomic (poly)neuropathy: Secondary | ICD-10-CM

## 2023-02-01 DIAGNOSIS — M79675 Pain in left toe(s): Secondary | ICD-10-CM

## 2023-02-01 DIAGNOSIS — B351 Tinea unguium: Secondary | ICD-10-CM | POA: Diagnosis not present

## 2023-02-01 NOTE — Progress Notes (Signed)
This patient returns to my office for at risk foot care.  This patient requires this care by a professional since this patient will be at risk due to having diabetic neuropathy claudication in PVD and CKD.  This patient is unable to cut nails himself since the patient cannot reach his nails.These nails are painful walking and wearing shoes. Patient presents to the office with his son.   This patient presents for at risk foot care today.  General Appearance  Alert, conversant and in no acute stress.  Vascular  Dorsalis pedis and posterior tibial  pulses are weakly  palpable  bilaterally.  Capillary return is within normal limits  bilaterally  Cold feet  bilaterally.  Absent digital hair noted.  Neurologic  Senn-Weinstein monofilament wire test diminished  bilaterally. Muscle power within normal limits bilaterally.  Nails Thick disfigured discolored nails with subungual debris  from hallux to fifth toes bilaterally. No evidence of bacterial infection or drainage bilaterally.  Orthopedic  No limitations of motion  feet .  No crepitus or effusions noted.  No bony pathology or digital deformities noted.  Plantar flexed fifth metatarsal head left foot.  HAV  left  foot with increased temperature and swelling.  Skin  normotropic skin with no porokeratosis noted bilaterally.  No signs of infections or ulcers noted.   Porokeratosis sub 5th met left foot.  Onychomycosis  Pain in right toes  Pain in left toes  Porokeratosis sub 5 left foot.  Consent was obtained for treatment procedures.   Mechanical debridement of nails 1-5  bilaterally performed with a nail nipper.  Filed with dremel tool.   Return office visit  10 weeks                   Told patient to return for periodic foot care and evaluation due to potential at risk complications.   Helane Gunther DPM

## 2023-02-04 ENCOUNTER — Ambulatory Visit: Payer: Medicare Other | Attending: Family Medicine

## 2023-02-04 ENCOUNTER — Ambulatory Visit: Payer: Medicare Other | Admitting: Podiatry

## 2023-02-04 DIAGNOSIS — M6281 Muscle weakness (generalized): Secondary | ICD-10-CM | POA: Insufficient documentation

## 2023-02-04 DIAGNOSIS — Z9181 History of falling: Secondary | ICD-10-CM | POA: Insufficient documentation

## 2023-02-04 NOTE — Therapy (Signed)
OUTPATIENT PHYSICAL THERAPY NEURO EVALUATION   Patient Name: Manuel Kline MRN: [id number] DOB:12/18/1931, 89 y.o., male Today's Date: 02/04/2023  REFERRING PROVIDER: Dettinger, Elige Radon, MD   END OF SESSION:  PT End of Session - 02/04/23 1436     Visit Number 1    Number of Visits 12    Date for PT Re-Evaluation 04/30/23    PT Start Time 1435    PT Stop Time 1515    PT Time Calculation (min) 40 min    Activity Tolerance Patient tolerated treatment well    Behavior During Therapy [facility] for tasks assessed/performed             Past Medical History:  Diagnosis Date   Adenomatous colon polyp 2006   CAD (coronary artery disease)    Calcium oxalate renal stones    Cardiomyopathy    Cataract    Cataract    Cerebral embolism with cerebral infarction 12/26/2018   Chronic systolic heart failure (HCC)         CKD (chronic kidney disease) stage 4, GFR 15-29 ml/min (HCC) 11/08/2017   Claudication in peripheral vascular disease (HCC) 07/23/2020   Dehydration 07/2022   Diabetes (HCC)    Diabetic peripheral neuropathy (HCC) 01/31/2015   Dual implantable cardioverter-defibrillator in situ    01/16/2002 Dr. Ladona Ridgel  RA lead  Guidant [id number] RV lead  Guidant [id number] Generator  Guidant Prism  09/02/2009 Generator change Medtronic [id number]  SN  [id number]      Erectile dysfunction    Erosive esophagitis    Essential hypertension 11/30/2015   Hemorrhoids    History of TIA (transient ischemic attack) 01/19/2019   HLD (hyperlipidemia)    HTN (hypertension)    Hyperlipidemia    Hypertensive heart disease without CHF 07/31/2011   ICD (implantable cardiac defibrillator) in place    ICD dual chamber in situ    Ischemic cardiomyopathy    EF 40% June 2013    Left-sided weakness 12/25/2018   Metabolic syndrome    Moderate nonproliferative diabetic retinopathy of both eyes without macular edema associated with type 2 diabetes mellitus (HCC) 02/20/2020   Morbid obesity (HCC)    OSA on  CPAP 11/05/2014   Osteoarthritis    Posterior vitreous detachment of both eyes 02/20/2020   Presence of automatic (implantable) cardiac defibrillator 11/08/2017   S/P CABG (coronary artery bypass graft) 11/02/2000   Sleep apnea    Stroke-like symptoms 12/26/2018   Syncope 08/06/2009   Qualifier: Diagnosis of  By: Susette Racer CMA, Jewel     Type 2 diabetes, uncontrolled, with neuropathy    Has retinopahty and neuropathy     Ventricular tachycardia (paroxysmal) (HCC) 07/14/2016   Past Surgical History:  Procedure Laterality Date   ABDOMINAL EXPLORATION SURGERY     BACK SURGERY     X'3   cardiac bypass     CARDIAC DEFIBRILLATOR PLACEMENT     CARPAL TUNNEL RELEASE     X2, bilateral   CATARACT EXTRACTION     COLONOSCOPY  06/20/2012   Procedure: COLONOSCOPY;  Surgeon: Mardella Layman, MD;  Location: [facility] ENDOSCOPY;  Service: Endoscopy;  Laterality: N/A;   DOPPLER ECHOCARDIOGRAPHY  2003   EP IMPLANTABLE DEVICE N/A 07/14/2016   Procedure: ICD Generator Changeout;  Surgeon: Marinus Maw, MD;  Location: [facility] INVASIVE CV LAB;  Service: Cardiovascular;  Laterality: N/A;   ESOPHAGOGASTRODUODENOSCOPY  06/20/2012   Procedure: ESOPHAGOGASTRODUODENOSCOPY (EGD);  Surgeon: Mardella Layman, MD;  Location: [facility];  Service: Endoscopy;  Laterality: N/A;   EYE SURGERY     LAPAROTOMY     RETINOPATHY SURGERY Bilateral    rotator cuff surgery     left   Patient Active Problem List   Diagnosis Date Noted   Porokeratosis 11/04/2022   Pneumatosis of intestines 07/21/2022   Diarrhea 07/21/2022   N&V (nausea and vomiting) 07/21/2022   Hav (hallux abducto valgus), left 05/06/2022   Callus 10/29/2021   Glaucoma suspect with open angle 04/02/2021   Pseudophakia of both eyes 08/20/2020   Claudication in peripheral vascular disease (HCC) 07/23/2020   Sleep apnea    Osteoarthritis    Metabolic syndrome    Erosive esophagitis    Erectile dysfunction    Calcium oxalate renal stones    Moderate  nonproliferative diabetic retinopathy of both eyes without macular edema associated with type 2 diabetes mellitus (HCC) 02/20/2020   Posterior vitreous detachment of both eyes 02/20/2020   History of TIA (transient ischemic attack) 01/19/2019   Cerebral embolism with cerebral infarction 12/26/2018   CKD (chronic kidney disease) stage 4, GFR 15-29 ml/min (HCC) 11/08/2017   ICD (implantable cardioverter-defibrillator) in place 11/08/2017   Ventricular tachycardia (paroxysmal) (HCC) 07/14/2016   Essential hypertension 11/30/2015   Diabetic peripheral neuropathy (HCC) 01/31/2015   OSA on CPAP 11/05/2014   CAD (coronary artery disease)    Dual implantable cardioverter-defibrillator in situ    Hyperlipidemia    Type 2 diabetes mellitus with other specified complication (HCC)    Morbid obesity (HCC)    Ischemic cardiomyopathy    Chronic systolic heart failure (HCC)    S/P CABG (coronary artery bypass graft) 11/02/2000    ONSET DATE: 2-3 years ago  REFERRING DIAG: Recurrent falls   THERAPY DIAG:  History of falling  Muscle weakness (generalized)  Rationale for Evaluation and Treatment: Rehabilitation  SUBJECTIVE:                                                                                                                                                                                             SUBJECTIVE STATEMENT: Patient reports that he got hit in the head about 2-3 years ago by a landscaping pole. He feels that his balance is worse and he has been falling since then. He has fallen about 12 times this year with the most recent fall being in his bathroom last weekend. These falls occurred were not consistent as some were inside while he was getting dressed while others were outside while he was walking through his yard. He cannot walk to his mailbox and back as his legs will give out on him. He has also had  his left leg go to sleep on him when he stands up after sitting on the couch.   Pt accompanied by: self  PERTINENT HISTORY: Chronic systolic heart failure, hypertension, diabetes, diabetic peripheral neuropathy, osteoarthritis, chronic kidney disease, pacemaker, and history of a TIA  PAIN:  Are you having pain? Yes: NPRS scale: 8/10 Pain location: left shoulder  PRECAUTIONS: Fall  WEIGHT BEARING RESTRICTIONS: No  FALLS: Has patient fallen in last 6 months? Yes. Number of falls 12+  LIVING ENVIRONMENT: Lives with: lives with their spouse Lives in: House/apartment Stairs: No Has following equipment at home: Grab bars and Hurry cane  PLOF: Independent  PATIENT GOALS: improved balance  OBJECTIVE:   COGNITION: Overall cognitive status: Within functional limits for tasks assessed   SENSATION: Light touch: Impaired  Diminished sensation in both lower extremities  EDEMA:  No edema observed  POSTURE: forward head and decreased lumbar lordosis  LOWER EXTREMITY MMT:    MMT Right Eval Left Eval  Hip flexion 4-/5 4-/5  Hip extension    Hip abduction    Hip adduction    Hip internal rotation    Hip external rotation    Knee flexion 4-/5 4/5  Knee extension 4-/5 4-/5  Ankle dorsiflexion 3/5 3/5  Ankle plantarflexion    Ankle inversion    Ankle eversion    (Blank rows = not tested)  TRANSFERS: Assistive device utilized: Counselling psychologist  Sit to stand: SBA and requires use of the armrests Stand to sit: SBA and requires use of the armrests  GAIT: Gait pattern: step through pattern, decreased step length- Right, decreased step length- Left, decreased stride length, ataxic, decreased trunk rotation, wide BOS, poor foot clearance- Right, and poor foot clearance- Left Assistive device utilized: Quad cane small base Level of assistance: SBA Comments: utilized intermittent external support from walls and doors  FUNCTIONAL TESTS:  5 times sit to stand: 26.98 seconds with use of the armrests Timed up and go (TUG): 20.54 seconds with small based  quad cane  TODAY'S TREATMENT:                                                                                                                              DATE:     PATIENT EDUCATION: Education details: POC, safety, utilizing a walker for safety, prognosis, and goals for therapy Person educated: Patient Education method: Explanation Education comprehension: verbalized understanding  HOME EXERCISE PROGRAM:   GOALS: Goals reviewed with patient? Yes  SHORT TERM GOALS: Target date: 02/25/23  Patient will be independent with his initial HEP. Baseline: Goal status: INITIAL  2.  Patient will improve his 5 times sit to stand time to 19 seconds or less for improved lower extremity power. Baseline:  Goal status: INITIAL  3.  Patient will improve his timed up and go time to 15 seconds or less for improved functional mobility. Baseline:  Goal status: INITIAL  LONG TERM GOALS: Target date: 03/18/23  Patient will be independent with his advanced HEP. Baseline:  Goal status: INITIAL  2.  Patient will improve his 5 times sit to stand time to 15 seconds or less to reduce his risk of falling. Baseline:  Goal status: INITIAL  3.  Patient will improve his timed up and go time to 12 seconds or less to reduce his fall risk. Baseline:  Goal status: INITIAL  4.  Patient will be able to transfer from sitting to standing with minimal to no upper extremity support. Baseline:  Goal status: INITIAL  ASSESSMENT:  CLINICAL IMPRESSION: Patient is a 89 y.o. male who was seen today for physical therapy evaluation and treatment for a history of falling. He is a high fall risk as evidenced by his objective measures such as his manual muscle testing, 5 times sit to stand, and timed up and go times in addition to his gait pattern and his history of falling. Recommend that he continue with skilled physical therapy to address his impairments to maximize his safety and functional mobility.  OBJECTIVE  IMPAIRMENTS: Abnormal gait, decreased activity tolerance, decreased balance, decreased mobility, difficulty walking, decreased strength, and pain.   ACTIVITY LIMITATIONS: carrying, lifting, standing, squatting, stairs, transfers, and locomotion level  PARTICIPATION LIMITATIONS: meal prep, cleaning, laundry, shopping, community activity, and yard work  PERSONAL FACTORS: Age, Fitness, Time since onset of injury/illness/exacerbation, and 3+ comorbidities: Chronic systolic heart failure, hypertension, diabetes, diabetic peripheral neuropathy, osteoarthritis, chronic kidney disease, pacemaker, and history of a TIA  are also affecting patient's functional outcome.   REHAB POTENTIAL: Fair    CLINICAL DECISION MAKING: Unstable/unpredictable  EVALUATION COMPLEXITY: High  PLAN:  PT FREQUENCY: 2x/week  PT DURATION: 6 weeks  PLANNED INTERVENTIONS: Therapeutic exercises, Therapeutic activity, Neuromuscular re-education, Balance training, Gait training, Patient/Family education, Self Care, Stair training, Cryotherapy, Moist heat, and Re-evaluation  PLAN FOR NEXT SESSION: nustep, lower extremity strengthening, gait training, and balance interventions   Granville Lewis, PT 02/04/2023, 6:14 PM

## 2023-02-09 ENCOUNTER — Encounter: Payer: Self-pay | Admitting: Physical Therapy

## 2023-02-09 ENCOUNTER — Ambulatory Visit: Payer: Medicare Other | Admitting: Physical Therapy

## 2023-02-09 DIAGNOSIS — M6281 Muscle weakness (generalized): Secondary | ICD-10-CM

## 2023-02-09 DIAGNOSIS — Z9181 History of falling: Secondary | ICD-10-CM

## 2023-02-09 NOTE — Therapy (Signed)
OUTPATIENT PHYSICAL THERAPY NEURO TREATMENT   Patient Name: Manuel Kline MRN: [id number] DOB:[birth year], 89 y.o., male Today's Date: 02/09/2023  REFERRING PROVIDER: Dettinger, Elige Radon, MD   END OF SESSION:  PT End of Session - 02/09/23 1533     Visit Number 2    Number of Visits 12    Date for PT Re-Evaluation 04/30/23    PT Start Time 1518    PT Stop Time 1558    PT Time Calculation (min) 40 min    Activity Tolerance Patient tolerated treatment well    Behavior During Therapy [facility] for tasks assessed/performed            Past Medical History:  Diagnosis Date   Adenomatous colon polyp 2006   CAD (coronary artery disease)    Calcium oxalate renal stones    Cardiomyopathy    Cataract    Cataract    Cerebral embolism with cerebral infarction 12/26/2018   Chronic systolic heart failure (HCC)         CKD (chronic kidney disease) stage 4, GFR 15-29 ml/min (HCC) 11/08/2017   Claudication in peripheral vascular disease (HCC) 07/23/2020   Dehydration 07/2022   Diabetes (HCC)    Diabetic peripheral neuropathy (HCC) 01/31/2015   Dual implantable cardioverter-defibrillator in situ    01/16/2002 Dr. Ladona Ridgel  RA lead  Guidant [id number] RV lead  Guidant [id number] Generator  Guidant Prism  09/02/2009 Generator change Medtronic [id number]  SN  [id number]      Erectile dysfunction    Erosive esophagitis    Essential hypertension 11/30/2015   Hemorrhoids    History of TIA (transient ischemic attack) 01/19/2019   HLD (hyperlipidemia)    HTN (hypertension)    Hyperlipidemia    Hypertensive heart disease without CHF 07/31/2011   ICD (implantable cardiac defibrillator) in place    ICD dual chamber in situ    Ischemic cardiomyopathy    EF 40% June 2013    Left-sided weakness 12/25/2018   Metabolic syndrome    Moderate nonproliferative diabetic retinopathy of both eyes without macular edema associated with type 2 diabetes mellitus (HCC) 02/20/2020   Morbid obesity (HCC)    OSA on CPAP  11/05/2014   Osteoarthritis    Posterior vitreous detachment of both eyes 02/20/2020   Presence of automatic (implantable) cardiac defibrillator 11/08/2017   S/P CABG (coronary artery bypass graft) 11/02/2000   Sleep apnea    Stroke-like symptoms 12/26/2018   Syncope 08/06/2009   Qualifier: Diagnosis of  By: Susette Racer CMA, Jewel     Type 2 diabetes, uncontrolled, with neuropathy    Has retinopahty and neuropathy     Ventricular tachycardia (paroxysmal) (HCC) 07/14/2016   Past Surgical History:  Procedure Laterality Date   ABDOMINAL EXPLORATION SURGERY     BACK SURGERY     X'3   cardiac bypass     CARDIAC DEFIBRILLATOR PLACEMENT     CARPAL TUNNEL RELEASE     X2, bilateral   CATARACT EXTRACTION     COLONOSCOPY  06/20/2012   Procedure: COLONOSCOPY;  Surgeon: Mardella Layman, MD;  Location: [facility];  Service: Endoscopy;  Laterality: N/A;   DOPPLER ECHOCARDIOGRAPHY  2003   EP IMPLANTABLE DEVICE N/A 07/14/2016   Procedure: ICD Generator Changeout;  Surgeon: Marinus Maw, MD;  Location: [facility] INVASIVE CV LAB;  Service: Cardiovascular;  Laterality: N/A;   ESOPHAGOGASTRODUODENOSCOPY  06/20/2012   Procedure: ESOPHAGOGASTRODUODENOSCOPY (EGD);  Surgeon: Mardella Layman, MD;  Location: [facility];  Service: Endoscopy;  Laterality: N/A;   EYE SURGERY     LAPAROTOMY     RETINOPATHY SURGERY Bilateral    rotator cuff surgery     left   Patient Active Problem List   Diagnosis Date Noted   Porokeratosis 11/04/2022   Pneumatosis of intestines 07/21/2022   Diarrhea 07/21/2022   N&V (nausea and vomiting) 07/21/2022   Hav (hallux abducto valgus), left 05/06/2022   Callus 10/29/2021   Glaucoma suspect with open angle 04/02/2021   Pseudophakia of both eyes 08/20/2020   Claudication in peripheral vascular disease (HCC) 07/23/2020   Sleep apnea    Osteoarthritis    Metabolic syndrome    Erosive esophagitis    Erectile dysfunction    Calcium oxalate renal stones    Moderate  nonproliferative diabetic retinopathy of both eyes without macular edema associated with type 2 diabetes mellitus (HCC) 02/20/2020   Posterior vitreous detachment of both eyes 02/20/2020   History of TIA (transient ischemic attack) 01/19/2019   Cerebral embolism with cerebral infarction 12/26/2018   CKD (chronic kidney disease) stage 4, GFR 15-29 ml/min (HCC) 11/08/2017   ICD (implantable cardioverter-defibrillator) in place 11/08/2017   Ventricular tachycardia (paroxysmal) (HCC) 07/14/2016   Essential hypertension 11/30/2015   Diabetic peripheral neuropathy (HCC) 01/31/2015   OSA on CPAP 11/05/2014   CAD (coronary artery disease)    Dual implantable cardioverter-defibrillator in situ    Hyperlipidemia    Type 2 diabetes mellitus with other specified complication (HCC)    Morbid obesity (HCC)    Ischemic cardiomyopathy    Chronic systolic heart failure (HCC)    S/P CABG (coronary artery bypass graft) 11/02/2000    ONSET DATE: 2-3 years ago  REFERRING DIAG: Recurrent falls   THERAPY DIAG:  History of falling  Muscle weakness (generalized)  Rationale for Evaluation and Treatment: Rehabilitation  SUBJECTIVE:                                                                                                                                                                                             SUBJECTIVE STATEMENT: Has FWW today but states that he got "wobbly" with reaching overhead to refill bird feeders and has difficulty with looking up as well. Reports that his feet are covered with calluses and hurts to stand for prolonged time. Pt accompanied by: self  PERTINENT HISTORY: Chronic systolic heart failure, hypertension, diabetes, diabetic peripheral neuropathy, osteoarthritis, chronic kidney disease, pacemaker, and history of a TIA  PAIN:  Are you having pain? Yes: NPRS scale: 8/10 Pain location: left shoulder  PRECAUTIONS: Fall  WEIGHT BEARING RESTRICTIONS: No  FALLS:  Has patient fallen in last  6 months? Yes. Number of falls 12+  LIVING ENVIRONMENT: Lives with: lives with their spouse Lives in: House/apartment Stairs: No Has following equipment at home: Grab bars and Hurry cane  PLOF: Independent  PATIENT GOALS: improved balance  OBJECTIVE:   COGNITION: Overall cognitive status: Within functional limits for tasks assessed   SENSATION: Light touch: Impaired  Diminished sensation in both lower extremities  EDEMA:  No edema observed  POSTURE: forward head and decreased lumbar lordosis  LOWER EXTREMITY MMT:    MMT Right Eval Left Eval  Hip flexion 4-/5 4-/5  Hip extension    Hip abduction    Hip adduction    Hip internal rotation    Hip external rotation    Knee flexion 4-/5 4/5  Knee extension 4-/5 4-/5  Ankle dorsiflexion 3/5 3/5  Ankle plantarflexion    Ankle inversion    Ankle eversion    (Blank rows = not tested)  TRANSFERS: Assistive device utilized: Environmental consultant - 2 wheeled  Sit to stand: SBA and requires use of the armrests Stand to sit: SBA and requires use of the armrests  GAIT: Gait pattern: step through pattern, decreased step length- Right, decreased step length- Left, decreased stride length, ataxic, decreased trunk rotation, wide BOS, poor foot clearance- Right, and poor foot clearance- Left Assistive device utilized: Quad cane small base Level of assistance: SBA Comments: utilized intermittent external support from walls and doors  FUNCTIONAL TESTS:  5 times sit to stand: 26.98 seconds with use of the armrests Timed up and go (TUG): 20.54 seconds with small based quad cane  TODAY'S TREATMENT:                                                                                                                              DATE:  02/09/23 EXERCISE LOG  Exercise Repetitions and Resistance Comments  Nustep L3 x16 min   LAQ 3# x20 reps   Seated marching 3# x20 reps   Seated HS curl Red theraband x15 reps   Seated clam  Red theraband x20 reps   Sit to stands X12 reps No UE support but did have LE contact with plinth  NBOS on floor X2 min Intermittent UE support  NBOS on airex X2 min Intermittent UE support  NBOS airex with head turns X1 min Intermittent UE support; with greater posterior lean with R rotation   Blank cell = exercise not performed today    PATIENT EDUCATION: Education details: POC, safety, utilizing a walker for safety, prognosis, and goals for therapy Person educated: Patient Education method: Explanation Education comprehension: verbalized understanding  HOME EXERCISE PROGRAM:  GOALS: Goals reviewed with patient? Yes  SHORT TERM GOALS: Target date: 02/25/23  Patient will be independent with his initial HEP. Baseline: Goal status: INITIAL  2.  Patient will improve his 5 times sit to stand time to 19 seconds or less for improved lower extremity power. Baseline:  Goal status: INITIAL  3.  Patient  will improve his timed up and go time to 15 seconds or less for improved functional mobility. Baseline:  Goal status: INITIAL  LONG TERM GOALS: Target date: 03/18/23  Patient will be independent with his advanced HEP. Baseline:  Goal status: INITIAL  2.  Patient will improve his 5 times sit to stand time to 15 seconds or less to reduce his risk of falling. Baseline:  Goal status: INITIAL  3.  Patient will improve his timed up and go time to 12 seconds or less to reduce his fall risk. Baseline:  Goal status: INITIAL  4.  Patient will be able to transfer from sitting to standing with minimal to no upper extremity support. Baseline:  Goal status: INITIAL  ASSESSMENT:  CLINICAL IMPRESSION: Patient presented in clinic with use of FWW per DPT recommendation at evaluation. Patient progressed through LE strengthening due to deficits found in evaluation with intermittent VC with demo to correct technique. Patient required LE contact to plinth for sit to stands although no UE support.  Patient indicates that most LOB occurs posteriorly. Even in standing, patient has minor posterior lean and greater posterior lean with CGA during balance activities. Patient indicates that recently he has experienced a burning along R inner thigh and while in standing for balance, reported his toes hurting and burning.  OBJECTIVE IMPAIRMENTS: Abnormal gait, decreased activity tolerance, decreased balance, decreased mobility, difficulty walking, decreased strength, and pain.   ACTIVITY LIMITATIONS: carrying, lifting, standing, squatting, stairs, transfers, and locomotion level  PARTICIPATION LIMITATIONS: meal prep, cleaning, laundry, shopping, community activity, and yard work  PERSONAL FACTORS: Age, Fitness, Time since onset of injury/illness/exacerbation, and 3+ comorbidities: Chronic systolic heart failure, hypertension, diabetes, diabetic peripheral neuropathy, osteoarthritis, chronic kidney disease, pacemaker, and history of a TIA  are also affecting patient's functional outcome.   REHAB POTENTIAL: Fair    CLINICAL DECISION MAKING: Unstable/unpredictable  EVALUATION COMPLEXITY: High  PLAN:  PT FREQUENCY: 2x/week  PT DURATION: 6 weeks  PLANNED INTERVENTIONS: Therapeutic exercises, Therapeutic activity, Neuromuscular re-education, Balance training, Gait training, Patient/Family education, Self Care, Stair training, Cryotherapy, Moist heat, and Re-evaluation  PLAN FOR NEXT SESSION: nustep, lower extremity strengthening, gait training, and balance interventions  Marvell Fuller, PTA 02/09/2023, 4:15 PM

## 2023-02-11 ENCOUNTER — Ambulatory Visit: Payer: Medicare Other

## 2023-02-11 DIAGNOSIS — Z9181 History of falling: Secondary | ICD-10-CM

## 2023-02-11 DIAGNOSIS — M6281 Muscle weakness (generalized): Secondary | ICD-10-CM

## 2023-02-11 NOTE — Progress Notes (Signed)
Remote ICD transmission.   

## 2023-02-11 NOTE — Therapy (Signed)
OUTPATIENT PHYSICAL THERAPY NEURO TREATMENT   Patient Name: Manuel Kline MRN: [id number] DOB:[birth year], 89 y.o., male Today's Date: 02/11/2023  REFERRING PROVIDER: Dettinger, Elige Radon, MD   END OF SESSION:  PT End of Session - 02/11/23 1523     Visit Number 3    Number of Visits 12    Date for PT Re-Evaluation 04/30/23    PT Start Time 1515    PT Stop Time 1600    PT Time Calculation (min) 45 min    Activity Tolerance Patient tolerated treatment well    Behavior During Therapy [facility] for tasks assessed/performed            Past Medical History:  Diagnosis Date   Adenomatous colon polyp 2006   CAD (coronary artery disease)    Calcium oxalate renal stones    Cardiomyopathy    Cataract    Cataract    Cerebral embolism with cerebral infarction 12/26/2018   Chronic systolic heart failure (HCC)         CKD (chronic kidney disease) stage 4, GFR 15-29 ml/min (HCC) 11/08/2017   Claudication in peripheral vascular disease (HCC) 07/23/2020   Dehydration 07/2022   Diabetes (HCC)    Diabetic peripheral neuropathy (HCC) 01/31/2015   Dual implantable cardioverter-defibrillator in situ    01/16/2002 Dr. Ladona Ridgel  RA lead  Guidant [id number] RV lead  Guidant [id number] Generator  Guidant Prism  09/02/2009 Generator change Medtronic [id number]  SN  [id number]      Erectile dysfunction    Erosive esophagitis    Essential hypertension 11/30/2015   Hemorrhoids    History of TIA (transient ischemic attack) 01/19/2019   HLD (hyperlipidemia)    HTN (hypertension)    Hyperlipidemia    Hypertensive heart disease without CHF 07/31/2011   ICD (implantable cardiac defibrillator) in place    ICD dual chamber in situ    Ischemic cardiomyopathy    EF 40% June 2013    Left-sided weakness 12/25/2018   Metabolic syndrome    Moderate nonproliferative diabetic retinopathy of both eyes without macular edema associated with type 2 diabetes mellitus (HCC) 02/20/2020   Morbid obesity (HCC)    OSA on CPAP  11/05/2014   Osteoarthritis    Posterior vitreous detachment of both eyes 02/20/2020   Presence of automatic (implantable) cardiac defibrillator 11/08/2017   S/P CABG (coronary artery bypass graft) 11/02/2000   Sleep apnea    Stroke-like symptoms 12/26/2018   Syncope 08/06/2009   Qualifier: Diagnosis of  By: Susette Racer CMA, Jewel     Type 2 diabetes, uncontrolled, with neuropathy    Has retinopahty and neuropathy     Ventricular tachycardia (paroxysmal) (HCC) 07/14/2016   Past Surgical History:  Procedure Laterality Date   ABDOMINAL EXPLORATION SURGERY     BACK SURGERY     X'3   cardiac bypass     CARDIAC DEFIBRILLATOR PLACEMENT     CARPAL TUNNEL RELEASE     X2, bilateral   CATARACT EXTRACTION     COLONOSCOPY  06/20/2012   Procedure: COLONOSCOPY;  Surgeon: Mardella Layman, MD;  Location: [facility];  Service: Endoscopy;  Laterality: N/A;   DOPPLER ECHOCARDIOGRAPHY  2003   EP IMPLANTABLE DEVICE N/A 07/14/2016   Procedure: ICD Generator Changeout;  Surgeon: Marinus Maw, MD;  [facility] INVASIVE CV LAB;  Service: Cardiovascular;  Laterality: N/A;   ESOPHAGOGASTRODUODENOSCOPY  06/20/2012   Procedure: ESOPHAGOGASTRODUODENOSCOPY (EGD);  Surgeon: Mardella Layman, MD;  Location: [facility];  Service: Endoscopy;  Laterality: N/A;   EYE SURGERY     LAPAROTOMY     RETINOPATHY SURGERY Bilateral    rotator cuff surgery     left   Patient Active Problem List   Diagnosis Date Noted   Porokeratosis 11/04/2022   Pneumatosis of intestines 07/21/2022   Diarrhea 07/21/2022   N&V (nausea and vomiting) 07/21/2022   Hav (hallux abducto valgus), left 05/06/2022   Callus 10/29/2021   Glaucoma suspect with open angle 04/02/2021   Pseudophakia of both eyes 08/20/2020   Claudication in peripheral vascular disease (HCC) 07/23/2020   Sleep apnea    Osteoarthritis    Metabolic syndrome    Erosive esophagitis    Erectile dysfunction    Calcium oxalate renal stones    Moderate  nonproliferative diabetic retinopathy of both eyes without macular edema associated with type 2 diabetes mellitus (HCC) 02/20/2020   Posterior vitreous detachment of both eyes 02/20/2020   History of TIA (transient ischemic attack) 01/19/2019   Cerebral embolism with cerebral infarction 12/26/2018   CKD (chronic kidney disease) stage 4, GFR 15-29 ml/min (HCC) 11/08/2017   ICD (implantable cardioverter-defibrillator) in place 11/08/2017   Ventricular tachycardia (paroxysmal) (HCC) 07/14/2016   Essential hypertension 11/30/2015   Diabetic peripheral neuropathy (HCC) 01/31/2015   OSA on CPAP 11/05/2014   CAD (coronary artery disease)    Dual implantable cardioverter-defibrillator in situ    Hyperlipidemia    Type 2 diabetes mellitus with other specified complication (HCC)    Morbid obesity (HCC)    Ischemic cardiomyopathy    Chronic systolic heart failure (HCC)    S/P CABG (coronary artery bypass graft) 11/02/2000    ONSET DATE: 2-3 years ago  REFERRING DIAG: Recurrent falls   THERAPY DIAG:  History of falling  Muscle weakness (generalized)  Rationale for Evaluation and Treatment: Rehabilitation  SUBJECTIVE:                                                                                                                                                                                             SUBJECTIVE STATEMENT: He reports that he feels about the same as he did at his last appointment. He notes that he fell in his yard yesterday, but he was not using a cane or his walker. He is not sore or hurting today.  Pt accompanied by: self  PERTINENT HISTORY: Chronic systolic heart failure, hypertension, diabetes, diabetic peripheral neuropathy, osteoarthritis, chronic kidney disease, pacemaker, and history of a TIA  PAIN:  Are you having pain? Yes: NPRS scale: 8/10 Pain location: left shoulder  PRECAUTIONS: Fall  WEIGHT BEARING RESTRICTIONS: No  FALLS: Has patient fallen  in last 6  months? Yes. Number of falls 12+  LIVING ENVIRONMENT: Lives with: lives with their spouse Lives in: House/apartment Stairs: No Has following equipment at home: Grab bars and Hurry cane  PLOF: Independent  PATIENT GOALS: improved balance  OBJECTIVE:   COGNITION: Overall cognitive status: Within functional limits for tasks assessed   SENSATION: Light touch: Impaired  Diminished sensation in both lower extremities  EDEMA:  No edema observed  POSTURE: forward head and decreased lumbar lordosis  LOWER EXTREMITY MMT:    MMT Right Eval Left Eval  Hip flexion 4-/5 4-/5  Hip extension    Hip abduction    Hip adduction    Hip internal rotation    Hip external rotation    Knee flexion 4-/5 4/5  Knee extension 4-/5 4-/5  Ankle dorsiflexion 3/5 3/5  Ankle plantarflexion    Ankle inversion    Ankle eversion    (Blank rows = not tested)  TRANSFERS: Assistive device utilized: Environmental consultant - 2 wheeled  Sit to stand: SBA and requires use of the armrests Stand to sit: SBA and requires use of the armrests  GAIT: Gait pattern: step through pattern, decreased step length- Right, decreased step length- Left, decreased stride length, ataxic, decreased trunk rotation, wide BOS, poor foot clearance- Right, and poor foot clearance- Left Assistive device utilized: Quad cane small base Level of assistance: SBA Comments: utilized intermittent external support from walls and doors  FUNCTIONAL TESTS:  5 times sit to stand: 26.98 seconds with use of the armrests Timed up and go (TUG): 20.54 seconds with small based quad cane  TODAY'S TREATMENT:                                                                                                                              DATE:                                   02/11/23 EXERCISE LOG  Exercise Repetitions and Resistance Comments  Nustep  L4 x 15 minutes   LAQ 3# x 3 minutes    Seated marching  3# x 25 reps each    Seated hee/toe raise  2.5  minutes    Seated HS curl  Red t-band x 1.5 minutes each    Seated hip ADD isometric  3 minutes w/ 5 second hold    Sit to stand  10 reps         Blank cell = exercise not performed today     02/09/23 EXERCISE LOG  Exercise Repetitions and Resistance Comments  Nustep L3 x16 min   LAQ 3# x20 reps   Seated marching 3# x20 reps   Seated HS curl Red theraband x15 reps   Seated clam Red theraband x20 reps   Sit to stands X12 reps No UE support but did have LE contact with plinth  NBOS on  floor X2 min Intermittent UE support  NBOS on airex X2 min Intermittent UE support  NBOS airex with head turns X1 min Intermittent UE support; with greater posterior lean with R rotation   Blank cell = exercise not performed today    PATIENT EDUCATION: Education details: POC, safety, utilizing a walker for safety, prognosis, and goals for therapy Person educated: Patient Education method: Explanation Education comprehension: verbalized understanding  HOME EXERCISE PROGRAM:  GOALS: Goals reviewed with patient? Yes  SHORT TERM GOALS: Target date: 02/25/23  Patient will be independent with his initial HEP. Baseline: Goal status: INITIAL  2.  Patient will improve his 5 times sit to stand time to 19 seconds or less for improved lower extremity power. Baseline:  Goal status: INITIAL  3.  Patient will improve his timed up and go time to 15 seconds or less for improved functional mobility. Baseline:  Goal status: INITIAL  LONG TERM GOALS: Target date: 03/18/23  Patient will be independent with his advanced HEP. Baseline:  Goal status: INITIAL  2.  Patient will improve his 5 times sit to stand time to 15 seconds or less to reduce his risk of falling. Baseline:  Goal status: INITIAL  3.  Patient will improve his timed up and go time to 12 seconds or less to reduce his fall risk. Baseline:  Goal status: INITIAL  4.  Patient will be able to transfer from sitting to standing with minimal to no  upper extremity support. Baseline:  Goal status: INITIAL  ASSESSMENT:  CLINICAL IMPRESSION: Treatment focused on familiar interventions for improved lower extremity strength for improved function with transfers and his daily activities. He required minimal cueing with seated hamstring curls for improved eccentric hamstring control. Fatigue was his primary limitation with today's interventions as he required brief rest breaks throughout treatment. He reported feeling good upon the conclusion of treatment. He continues to require skilled physical therapy to address his remaining impairments to maximize his safety and functional mobility.    OBJECTIVE IMPAIRMENTS: Abnormal gait, decreased activity tolerance, decreased balance, decreased mobility, difficulty walking, decreased strength, and pain.   ACTIVITY LIMITATIONS: carrying, lifting, standing, squatting, stairs, transfers, and locomotion level  PARTICIPATION LIMITATIONS: meal prep, cleaning, laundry, shopping, community activity, and yard work  PERSONAL FACTORS: Age, Fitness, Time since onset of injury/illness/exacerbation, and 3+ comorbidities: Chronic systolic heart failure, hypertension, diabetes, diabetic peripheral neuropathy, osteoarthritis, chronic kidney disease, pacemaker, and history of a TIA  are also affecting patient's functional outcome.   REHAB POTENTIAL: Fair    CLINICAL DECISION MAKING: Unstable/unpredictable  EVALUATION COMPLEXITY: High  PLAN:  PT FREQUENCY: 2x/week  PT DURATION: 6 weeks  PLANNED INTERVENTIONS: Therapeutic exercises, Therapeutic activity, Neuromuscular re-education, Balance training, Gait training, Patient/Family education, Self Care, Stair training, Cryotherapy, Moist heat, and Re-evaluation  PLAN FOR NEXT SESSION: nustep, lower extremity strengthening, gait training, and balance interventions  Granville Lewis, PT 02/11/2023, 4:32 PM

## 2023-02-16 ENCOUNTER — Encounter: Payer: Self-pay | Admitting: Physical Therapy

## 2023-02-16 ENCOUNTER — Ambulatory Visit: Payer: Medicare Other | Admitting: Physical Therapy

## 2023-02-16 DIAGNOSIS — M6281 Muscle weakness (generalized): Secondary | ICD-10-CM

## 2023-02-16 DIAGNOSIS — Z9181 History of falling: Secondary | ICD-10-CM | POA: Diagnosis not present

## 2023-02-16 DIAGNOSIS — G4733 Obstructive sleep apnea (adult) (pediatric): Secondary | ICD-10-CM | POA: Diagnosis not present

## 2023-02-16 NOTE — Therapy (Signed)
OUTPATIENT PHYSICAL THERAPY NEURO TREATMENT   Patient Name: Manuel Kline MRN: [id number] DOB:[birth year], 89 y.o., male Today's Date: 02/16/2023  REFERRING PROVIDER: Dettinger, Elige Radon, MD   END OF SESSION:  PT End of Session - 02/16/23 1523     Visit Number 4    Number of Visits 12    Date for PT Re-Evaluation 04/30/23    PT Start Time 1516    PT Stop Time 1556    PT Time Calculation (min) 40 min    Equipment Utilized During Treatment Other (comment)   Rollator   Activity Tolerance Patient tolerated treatment well    Behavior During Therapy [facility] for tasks assessed/performed            Past Medical History:  Diagnosis Date   Adenomatous colon polyp 2006   CAD (coronary artery disease)    Calcium oxalate renal stones    Cardiomyopathy    Cataract    Cataract    Cerebral embolism with cerebral infarction 12/26/2018   Chronic systolic heart failure (HCC)         CKD (chronic kidney disease) stage 4, GFR 15-29 ml/min (HCC) 11/08/2017   Claudication in peripheral vascular disease (HCC) 07/23/2020   Dehydration 07/2022   Diabetes (HCC)    Diabetic peripheral neuropathy (HCC) 01/31/2015   Dual implantable cardioverter-defibrillator in situ    01/16/2002 Dr. Ladona Ridgel  RA lead  Guidant [id number] RV lead  Guidant [id number] Generator  Guidant Prism  09/02/2009 Generator change Medtronic [id number]  SN  [id number]      Erectile dysfunction    Erosive esophagitis    Essential hypertension 11/30/2015   Hemorrhoids    History of TIA (transient ischemic attack) 01/19/2019   HLD (hyperlipidemia)    HTN (hypertension)    Hyperlipidemia    Hypertensive heart disease without CHF 07/31/2011   ICD (implantable cardiac defibrillator) in place    ICD dual chamber in situ    Ischemic cardiomyopathy    EF 40% June 2013    Left-sided weakness 12/25/2018   Metabolic syndrome    Moderate nonproliferative diabetic retinopathy of both eyes without macular edema associated with type 2 diabetes  mellitus (HCC) 02/20/2020   Morbid obesity (HCC)    OSA on CPAP 11/05/2014   Osteoarthritis    Posterior vitreous detachment of both eyes 02/20/2020   Presence of automatic (implantable) cardiac defibrillator 11/08/2017   S/P CABG (coronary artery bypass graft) 11/02/2000   Sleep apnea    Stroke-like symptoms 12/26/2018   Syncope 08/06/2009   Qualifier: Diagnosis of  By: Susette Racer CMA, Jewel     Type 2 diabetes, uncontrolled, with neuropathy    Has retinopahty and neuropathy     Ventricular tachycardia (paroxysmal) (HCC) 07/14/2016   Past Surgical History:  Procedure Laterality Date   ABDOMINAL EXPLORATION SURGERY     BACK SURGERY     X'3   cardiac bypass     CARDIAC DEFIBRILLATOR PLACEMENT     CARPAL TUNNEL RELEASE     X2, bilateral   CATARACT EXTRACTION     COLONOSCOPY  06/20/2012   Procedure: COLONOSCOPY;  Surgeon: Mardella Layman, MD;  Location: [facility];  Service: Endoscopy;  Laterality: N/A;   DOPPLER ECHOCARDIOGRAPHY  2003   EP IMPLANTABLE DEVICE N/A 07/14/2016   Procedure: ICD Generator Changeout;  Surgeon: Marinus Maw, MD;  Location: [facility] INVASIVE CV LAB;  Service: Cardiovascular;  Laterality: N/A;   ESOPHAGOGASTRODUODENOSCOPY  06/20/2012   Procedure: ESOPHAGOGASTRODUODENOSCOPY (EGD);  Surgeon: Onalee Hua  Hale Bogus, MD;  Location: [facility];  Service: Endoscopy;  Laterality: N/A;   EYE SURGERY     LAPAROTOMY     RETINOPATHY SURGERY Bilateral    rotator cuff surgery     left   Patient Active Problem List   Diagnosis Date Noted   Porokeratosis 11/04/2022   Pneumatosis of intestines 07/21/2022   Diarrhea 07/21/2022   N&V (nausea and vomiting) 07/21/2022   Hav (hallux abducto valgus), left 05/06/2022   Callus 10/29/2021   Glaucoma suspect with open angle 04/02/2021   Pseudophakia of both eyes 08/20/2020   Claudication in peripheral vascular disease (HCC) 07/23/2020   Sleep apnea    Osteoarthritis    Metabolic syndrome    Erosive esophagitis    Erectile  dysfunction    Calcium oxalate renal stones    Moderate nonproliferative diabetic retinopathy of both eyes without macular edema associated with type 2 diabetes mellitus (HCC) 02/20/2020   Posterior vitreous detachment of both eyes 02/20/2020   History of TIA (transient ischemic attack) 01/19/2019   Cerebral embolism with cerebral infarction 12/26/2018   CKD (chronic kidney disease) stage 4, GFR 15-29 ml/min (HCC) 11/08/2017   ICD (implantable cardioverter-defibrillator) in place 11/08/2017   Ventricular tachycardia (paroxysmal) (HCC) 07/14/2016   Essential hypertension 11/30/2015   Diabetic peripheral neuropathy (HCC) 01/31/2015   OSA on CPAP 11/05/2014   CAD (coronary artery disease)    Dual implantable cardioverter-defibrillator in situ    Hyperlipidemia    Type 2 diabetes mellitus with other specified complication (HCC)    Morbid obesity (HCC)    Ischemic cardiomyopathy    Chronic systolic heart failure (HCC)    S/P CABG (coronary artery bypass graft) 11/02/2000   ONSET DATE: 2-3 years ago  REFERRING DIAG: Recurrent falls   THERAPY DIAG:  History of falling  Muscle weakness (generalized)  Rationale for Evaluation and Treatment: Rehabilitation  SUBJECTIVE:                                                                                                                                                                                             SUBJECTIVE STATEMENT: Reports he has been very wobbly and all but falling around especially with looking up. Pt accompanied by: self  PERTINENT HISTORY: Chronic systolic heart failure, hypertension, diabetes, diabetic peripheral neuropathy, osteoarthritis, chronic kidney disease, pacemaker, and history of a TIA  PAIN:  Are you having pain? Yes: NPRS scale: no pain score provided/10 Pain location: left shoulder  with use  PRECAUTIONS: Fall  WEIGHT BEARING RESTRICTIONS: No  FALLS: Has patient fallen in last 6 months? Yes. Number of  falls 12+  LIVING ENVIRONMENT: Lives with: lives with their spouse Lives in: House/apartment Stairs: No Has following equipment at home: Grab bars and Hurry cane  PLOF: Independent  PATIENT GOALS: improved balance  OBJECTIVE:   COGNITION: Overall cognitive status: Within functional limits for tasks assessed   SENSATION: Light touch: Impaired  Diminished sensation in both lower extremities  EDEMA:  No edema observed  POSTURE: forward head and decreased lumbar lordosis  LOWER EXTREMITY MMT:    MMT Right Eval Left Eval  Hip flexion 4-/5 4-/5  Hip extension    Hip abduction    Hip adduction    Hip internal rotation    Hip external rotation    Knee flexion 4-/5 4/5  Knee extension 4-/5 4-/5  Ankle dorsiflexion 3/5 3/5  Ankle plantarflexion    Ankle inversion    Ankle eversion    (Blank rows = not tested)  TRANSFERS: Assistive device utilized: Environmental consultant - 2 wheeled  Sit to stand: SBA and requires use of the armrests Stand to sit: SBA and requires use of the armrests  GAIT: Gait pattern: step through pattern, decreased step length- Right, decreased step length- Left, decreased stride length, ataxic, decreased trunk rotation, wide BOS, poor foot clearance- Right, and poor foot clearance- Left Assistive device utilized: Quad cane small base Level of assistance: SBA Comments: utilized intermittent external support from walls and doors  FUNCTIONAL TESTS:  5 times sit to stand: 26.98 seconds with use of the armrests Timed up and go (TUG): 20.54 seconds with small based quad cane  TODAY'S TREATMENT:                                                                                                                              DATE:  02/16/23 EXERCISE LOG  Exercise Repetitions and Resistance Comments  Nustep  L3 x 19 minutes   LAQ 3# x30 reps   Seated marching  3# x 25 reps each    Seated hee/toe raise  30 reps   Seated HS curl  Red t-band x 20 reps each   Seated hip  ADD isometric  3 minutes w/ 5 second hold  Reported B hip pain  Sit to stand  10 reps  Without UE support and pause in standing  Seated hip clam Red theraband x30 reps    Blank cell = exercise not performed today   PATIENT EDUCATION: Education details: POC, safety, utilizing a walker for safety, prognosis, and goals for therapy Person educated: Patient Education method: Explanation Education comprehension: verbalized understanding  HOME EXERCISE PROGRAM:  GOALS: Goals reviewed with patient? Yes  SHORT TERM GOALS: Target date: 02/25/23  Patient will be independent with his initial HEP. Baseline: Goal status: INITIAL  2.  Patient will improve his 5 times sit to stand time to 19 seconds or less for improved lower extremity power. Baseline:  Goal status: INITIAL  3.  Patient will improve his timed up and go time to 15 seconds  or less for improved functional mobility. Baseline:  Goal status: INITIAL  LONG TERM GOALS: Target date: 03/18/23  Patient will be independent with his advanced HEP. Baseline:  Goal status: INITIAL  2.  Patient will improve his 5 times sit to stand time to 15 seconds or less to reduce his risk of falling. Baseline:  Goal status: INITIAL  3.  Patient will improve his timed up and go time to 12 seconds or less to reduce his fall risk. Baseline:  Goal status: INITIAL  4.  Patient will be able to transfer from sitting to standing with minimal to no upper extremity support. Baseline:  Goal status: INITIAL  ASSESSMENT:  CLINICAL IMPRESSION: Patient presented in clinic with reports of continued L shoulder pain but also being very "wobbly" over the last few days. Patient states that he is always wobbly in looking up but more so lately. Patient denies any medication changes or changes in doses. Patient did report a dizziness when sit > stands occur and was encouraged to keep rollator close for support. Patient was instructed to sit and look at the ceiling in  which he also reported a dizziness.   OBJECTIVE IMPAIRMENTS: Abnormal gait, decreased activity tolerance, decreased balance, decreased mobility, difficulty walking, decreased strength, and pain.   ACTIVITY LIMITATIONS: carrying, lifting, standing, squatting, stairs, transfers, and locomotion level  PARTICIPATION LIMITATIONS: meal prep, cleaning, laundry, shopping, community activity, and yard work  PERSONAL FACTORS: Age, Fitness, Time since onset of injury/illness/exacerbation, and 3+ comorbidities: Chronic systolic heart failure, hypertension, diabetes, diabetic peripheral neuropathy, osteoarthritis, chronic kidney disease, pacemaker, and history of a TIA  are also affecting patient's functional outcome.   REHAB POTENTIAL: Fair    CLINICAL DECISION MAKING: Unstable/unpredictable  EVALUATION COMPLEXITY: High  PLAN:  PT FREQUENCY: 2x/week  PT DURATION: 6 weeks  PLANNED INTERVENTIONS: Therapeutic exercises, Therapeutic activity, Neuromuscular re-education, Balance training, Gait training, Patient/Family education, Self Care, Stair training, Cryotherapy, Moist heat, and Re-evaluation  PLAN FOR NEXT SESSION: nustep, lower extremity strengthening, gait training, and balance interventions  Marvell Fuller, PTA 02/16/2023, 4:05 PM

## 2023-02-18 ENCOUNTER — Ambulatory Visit: Payer: Medicare Other

## 2023-02-18 DIAGNOSIS — Z9181 History of falling: Secondary | ICD-10-CM

## 2023-02-18 DIAGNOSIS — M6281 Muscle weakness (generalized): Secondary | ICD-10-CM

## 2023-02-18 NOTE — Therapy (Signed)
OUTPATIENT PHYSICAL THERAPY NEURO TREATMENT   Patient Name: Manuel Kline MRN: [id number] DOB:Aug 29, 1932, 89 y.o., male Today's Date: 02/18/2023  REFERRING PROVIDER: Dettinger, Elige Radon, MD   END OF SESSION:  PT End of Session - 02/18/23 1524     Visit Number 5    Number of Visits 12    Date for PT Re-Evaluation 04/30/23    PT Start Time 1525   Patient arrived late to his appointment.   PT Stop Time 1558    PT Time Calculation (min) 33 min    Equipment Utilized During Treatment Other (comment)   Rollator   Activity Tolerance Patient tolerated treatment well    Behavior During Therapy [facility] for tasks assessed/performed            Past Medical History:  Diagnosis Date   Adenomatous colon polyp 2006   CAD (coronary artery disease)    Calcium oxalate renal stones    Cardiomyopathy    Cataract    Cataract    Cerebral embolism with cerebral infarction 12/26/2018   Chronic systolic heart failure (HCC)         CKD (chronic kidney disease) stage 4, GFR 15-29 ml/min (HCC) 11/08/2017   Claudication in peripheral vascular disease (HCC) 07/23/2020   Dehydration 07/2022   Diabetes (HCC)    Diabetic peripheral neuropathy (HCC) 01/31/2015   Dual implantable cardioverter-defibrillator in situ    01/16/2002 Dr. Ladona Ridgel  RA lead  Guidant [id number] RV lead  Guidant [id number] Generator  Guidant Prism  09/02/2009 Generator change Medtronic [id number]  SN  [id number]      Erectile dysfunction    Erosive esophagitis    Essential hypertension 11/30/2015   Hemorrhoids    History of TIA (transient ischemic attack) 01/19/2019   HLD (hyperlipidemia)    HTN (hypertension)    Hyperlipidemia    Hypertensive heart disease without CHF 07/31/2011   ICD (implantable cardiac defibrillator) in place    ICD dual chamber in situ    Ischemic cardiomyopathy    EF 40% June 2013    Left-sided weakness 12/25/2018   Metabolic syndrome    Moderate nonproliferative diabetic retinopathy of both eyes without  macular edema associated with type 2 diabetes mellitus (HCC) 02/20/2020   Morbid obesity (HCC)    OSA on CPAP 11/05/2014   Osteoarthritis    Posterior vitreous detachment of both eyes 02/20/2020   Presence of automatic (implantable) cardiac defibrillator 11/08/2017   S/P CABG (coronary artery bypass graft) 11/02/2000   Sleep apnea    Stroke-like symptoms 12/26/2018   Syncope 08/06/2009   Qualifier: Diagnosis of  By: Susette Racer CMA, Jewel     Type 2 diabetes, uncontrolled, with neuropathy    Has retinopahty and neuropathy     Ventricular tachycardia (paroxysmal) (HCC) 07/14/2016   Past Surgical History:  Procedure Laterality Date   ABDOMINAL EXPLORATION SURGERY     BACK SURGERY     X'3   cardiac bypass     CARDIAC DEFIBRILLATOR PLACEMENT     CARPAL TUNNEL RELEASE     X2, bilateral   CATARACT EXTRACTION     COLONOSCOPY  06/20/2012   Procedure: COLONOSCOPY;  Surgeon: Mardella Layman, MD;  Location: [facility];  Service: Endoscopy;  Laterality: N/A;   DOPPLER ECHOCARDIOGRAPHY  2003   EP IMPLANTABLE DEVICE N/A 07/14/2016   Procedure: ICD Generator Changeout;  Surgeon: Marinus Maw, MD;  Location: [facility] INVASIVE CV LAB;  Service: Cardiovascular;  Laterality: N/A;   ESOPHAGOGASTRODUODENOSCOPY  06/20/2012  Procedure: ESOPHAGOGASTRODUODENOSCOPY (EGD);  Surgeon: Mardella Layman, MD;  Location: [facility];  Service: Endoscopy;  Laterality: N/A;   EYE SURGERY     LAPAROTOMY     RETINOPATHY SURGERY Bilateral    rotator cuff surgery     left   Patient Active Problem List   Diagnosis Date Noted   Porokeratosis 11/04/2022   Pneumatosis of intestines 07/21/2022   Diarrhea 07/21/2022   N&V (nausea and vomiting) 07/21/2022   Hav (hallux abducto valgus), left 05/06/2022   Callus 10/29/2021   Glaucoma suspect with open angle 04/02/2021   Pseudophakia of both eyes 08/20/2020   Claudication in peripheral vascular disease (HCC) 07/23/2020   Sleep apnea    Osteoarthritis    Metabolic  syndrome    Erosive esophagitis    Erectile dysfunction    Calcium oxalate renal stones    Moderate nonproliferative diabetic retinopathy of both eyes without macular edema associated with type 2 diabetes mellitus (HCC) 02/20/2020   Posterior vitreous detachment of both eyes 02/20/2020   History of TIA (transient ischemic attack) 01/19/2019   Cerebral embolism with cerebral infarction 12/26/2018   CKD (chronic kidney disease) stage 4, GFR 15-29 ml/min (HCC) 11/08/2017   ICD (implantable cardioverter-defibrillator) in place 11/08/2017   Ventricular tachycardia (paroxysmal) (HCC) 07/14/2016   Essential hypertension 11/30/2015   Diabetic peripheral neuropathy (HCC) 01/31/2015   OSA on CPAP 11/05/2014   CAD (coronary artery disease)    Dual implantable cardioverter-defibrillator in situ    Hyperlipidemia    Type 2 diabetes mellitus with other specified complication (HCC)    Morbid obesity (HCC)    Ischemic cardiomyopathy    Chronic systolic heart failure (HCC)    S/P CABG (coronary artery bypass graft) 11/02/2000   ONSET DATE: 2-3 years ago  REFERRING DIAG: Recurrent falls   THERAPY DIAG:  History of falling  Muscle weakness (generalized)  Rationale for Evaluation and Treatment: Rehabilitation  SUBJECTIVE:                                                                                                                                                                                             SUBJECTIVE STATEMENT: Patient reports that he had to catch himself yesterday because he lost his balance when he looked up. He notes that his hips and legs are tired today. He notes that his legs were "extra tired" after coming to therapy.  Pt accompanied by: self  PERTINENT HISTORY: Chronic systolic heart failure, hypertension, diabetes, diabetic peripheral neuropathy, osteoarthritis, chronic kidney disease, pacemaker, and history of a TIA  PAIN:  Are you having pain? Yes: NPRS scale: no  pain  score provided/10 Pain location: left shoulder  with use  PRECAUTIONS: Fall  WEIGHT BEARING RESTRICTIONS: No  FALLS: Has patient fallen in last 6 months? Yes. Number of falls 12+  LIVING ENVIRONMENT: Lives with: lives with their spouse Lives in: House/apartment Stairs: No Has following equipment at home: Grab bars and Hurry cane  PLOF: Independent  PATIENT GOALS: improved balance  OBJECTIVE: all objective measures were assessed at his initial evaluation on 02/04/23 unless otherwise noted  COGNITION: Overall cognitive status: Within functional limits for tasks assessed   SENSATION: Light touch: Impaired  Diminished sensation in both lower extremities  EDEMA:  No edema observed  POSTURE: forward head and decreased lumbar lordosis  LOWER EXTREMITY MMT:    MMT Right Eval Left Eval  Hip flexion 4-/5 4-/5  Hip extension    Hip abduction    Hip adduction    Hip internal rotation    Hip external rotation    Knee flexion 4-/5 4/5  Knee extension 4-/5 4-/5  Ankle dorsiflexion 3/5 3/5  Ankle plantarflexion    Ankle inversion    Ankle eversion    (Blank rows = not tested)  TRANSFERS: Assistive device utilized: Environmental consultant - 2 wheeled  Sit to stand: SBA and requires use of the armrests Stand to sit: SBA and requires use of the armrests  GAIT: Gait pattern: step through pattern, decreased step length- Right, decreased step length- Left, decreased stride length, ataxic, decreased trunk rotation, wide BOS, poor foot clearance- Right, and poor foot clearance- Left Assistive device utilized: Quad cane small base Level of assistance: SBA Comments: utilized intermittent external support from walls and doors  FUNCTIONAL TESTS:  5 times sit to stand: 26.98 seconds with use of the armrests Timed up and go (TUG): 20.54 seconds with small based quad cane  TODAY'S TREATMENT:                                                                                                                               DATE:                                   02/18/23 EXERCISE LOG  Exercise Repetitions and Resistance Comments  LAQ 3# x 3 minutes   Seated heel/toe raises 20 reps    Seated HS curl  Red t-band x 20 reps each             Blank cell = exercise not performed today     02/16/23 EXERCISE LOG  Exercise Repetitions and Resistance Comments  Nustep  L3 x 19 minutes   LAQ 3# x30 reps   Seated marching  3# x 25 reps each    Seated hee/toe raise  30 reps   Seated HS curl  Red t-band x 20 reps each   Seated hip ADD isometric  3 minutes w/ 5 second hold  Reported B hip  pain  Sit to stand  10 reps  Without UE support and pause in standing  Seated hip clam Red theraband x30 reps    Blank cell = exercise not performed today   PATIENT EDUCATION: Education details: vestibular therapy, safety, and progress with therapy Person educated: Patient Education method: Explanation Education comprehension: verbalized understanding  HOME EXERCISE PROGRAM:  GOALS: Goals reviewed with patient? Yes  SHORT TERM GOALS: Target date: 02/25/23  Patient will be independent with his initial HEP. Baseline: Goal status: IN PROGRESS  2.  Patient will improve his 5 times sit to stand time to 19 seconds or less for improved lower extremity power. Baseline: 26.25 seconds on 02/18/23 Goal status: IN PROGRESS  3.  Patient will improve his timed up and go time to 15 seconds or less for improved functional mobility. Baseline: 24.63 seconds with Rolator on 02/18/23 Goal status: IN PROGRESS  LONG TERM GOALS: Target date: 03/18/23  Patient will be independent with his advanced HEP. Baseline:  Goal status: IN PROGRESS  2.  Patient will improve his 5 times sit to stand time to 15 seconds or less to reduce his risk of falling. Baseline:  Goal status: IN PROGRESS  3.  Patient will improve his timed up and go time to 12 seconds or less to reduce his fall risk. Baseline:  Goal status: IN  PROGRESS  4.  Patient will be able to transfer from sitting to standing with minimal to no upper extremity support. Baseline:  Goal status: IN PROGRESS  ASSESSMENT:  CLINICAL IMPRESSION: Patient has made minimal progress with skilled physical therapy at this time as evidenced by his subjective reports, objective measures, functional mobility, and progress toward his goals. He continues to report and exhibit increased unsteadiness with cervical extension. Today's interventions focused on familiar interventions for light lower extremity strengthening. He reported feeling "alright" upon the conclusion of treatment. He would benefit from a vestibular assessment to address his continued dizziness which is resulting in increased unsteadiness and falling.   OBJECTIVE IMPAIRMENTS: Abnormal gait, decreased activity tolerance, decreased balance, decreased mobility, difficulty walking, decreased strength, and pain.   ACTIVITY LIMITATIONS: carrying, lifting, standing, squatting, stairs, transfers, and locomotion level  PARTICIPATION LIMITATIONS: meal prep, cleaning, laundry, shopping, community activity, and yard work  PERSONAL FACTORS: Age, Fitness, Time since onset of injury/illness/exacerbation, and 3+ comorbidities: Chronic systolic heart failure, hypertension, diabetes, diabetic peripheral neuropathy, osteoarthritis, chronic kidney disease, pacemaker, and history of a TIA  are also affecting patient's functional outcome.   REHAB POTENTIAL: Fair    CLINICAL DECISION MAKING: Unstable/unpredictable  EVALUATION COMPLEXITY: High  PLAN:  PT FREQUENCY: 2x/week  PT DURATION: 6 weeks  PLANNED INTERVENTIONS: Therapeutic exercises, Therapeutic activity, Neuromuscular re-education, Balance training, Gait training, Patient/Family education, Self Care, Stair training, Vestibular training, Canalith repositioning, Visual/preceptual remediation/compensation, Cryotherapy, Moist heat, and Re-evaluation  PLAN  FOR NEXT SESSION: nustep, lower extremity strengthening, gait training, and balance interventions  Granville Lewis, PT 02/18/2023, 6:01 PM

## 2023-02-22 ENCOUNTER — Other Ambulatory Visit: Payer: Self-pay | Admitting: Family Medicine

## 2023-02-22 ENCOUNTER — Ambulatory Visit: Payer: Medicare Other | Attending: Family Medicine

## 2023-02-22 DIAGNOSIS — R42 Dizziness and giddiness: Secondary | ICD-10-CM

## 2023-02-22 DIAGNOSIS — Z9181 History of falling: Secondary | ICD-10-CM | POA: Insufficient documentation

## 2023-02-22 DIAGNOSIS — M6281 Muscle weakness (generalized): Secondary | ICD-10-CM | POA: Diagnosis not present

## 2023-02-22 NOTE — Progress Notes (Signed)
Placed referral for vestibular rehab
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
## 2023-02-25 ENCOUNTER — Ambulatory Visit: Payer: Medicare Other

## 2023-02-25 DIAGNOSIS — M6281 Muscle weakness (generalized): Secondary | ICD-10-CM | POA: Diagnosis not present

## 2023-02-25 DIAGNOSIS — R42 Dizziness and giddiness: Secondary | ICD-10-CM

## 2023-02-25 DIAGNOSIS — Z9181 History of falling: Secondary | ICD-10-CM | POA: Diagnosis not present

## 2023-02-25 NOTE — Therapy (Signed)
OUTPATIENT PHYSICAL THERAPY NEURO TREATMENT    Patient Name: Manuel Kline MRN: [id number] DOB:[birth year], 89 y.o., male Today's Date: 02/25/2023  REFERRING PROVIDER: Dettinger, Elige Radon, MD   END OF SESSION:  PT End of Session - 02/25/23 1050     Visit Number 7    Number of Visits 12    Date for PT Re-Evaluation 04/30/23    Authorization Type [employer] Medicare    PT Start Time 1100    PT Stop Time 1145    PT Time Calculation (min) 45 min    Equipment Utilized During Treatment Other (comment)   Rollator   Activity Tolerance Patient tolerated treatment well    Behavior During Therapy [facility] for tasks assessed/performed            Past Medical History:  Diagnosis Date   Adenomatous colon polyp 2006   CAD (coronary artery disease)    Calcium oxalate renal stones    Cardiomyopathy    Cataract    Cataract    Cerebral embolism with cerebral infarction 12/26/2018   Chronic systolic heart failure (HCC)         CKD (chronic kidney disease) stage 4, GFR 15-29 ml/min (HCC) 11/08/2017   Claudication in peripheral vascular disease (HCC) 07/23/2020   Dehydration 07/2022   Diabetes (HCC)    Diabetic peripheral neuropathy (HCC) 01/31/2015   Dual implantable cardioverter-defibrillator in situ    01/16/2002 Dr. Ladona Ridgel  RA lead  Guidant [id number] RV lead  Guidant [id number] Generator  Guidant Prism  09/02/2009 Generator change Medtronic [id number]  SN  [id number]      Erectile dysfunction    Erosive esophagitis    Essential hypertension 11/30/2015   Hemorrhoids    History of TIA (transient ischemic attack) 01/19/2019   HLD (hyperlipidemia)    HTN (hypertension)    Hyperlipidemia    Hypertensive heart disease without CHF 07/31/2011   ICD (implantable cardiac defibrillator) in place    ICD dual chamber in situ    Ischemic cardiomyopathy    EF 40% June 2013    Left-sided weakness 12/25/2018   Metabolic syndrome    Moderate nonproliferative diabetic retinopathy of both eyes without macular  edema associated with type 2 diabetes mellitus (HCC) 02/20/2020   Morbid obesity (HCC)    OSA on CPAP 11/05/2014   Osteoarthritis    Posterior vitreous detachment of both eyes 02/20/2020   Presence of automatic (implantable) cardiac defibrillator 11/08/2017   S/P CABG (coronary artery bypass graft) 11/02/2000   Sleep apnea    Stroke-like symptoms 12/26/2018   Syncope 08/06/2009   Qualifier: Diagnosis of  By: Susette Racer CMA, Jewel     Type 2 diabetes, uncontrolled, with neuropathy    Has retinopahty and neuropathy     Ventricular tachycardia (paroxysmal) (HCC) 07/14/2016   Past Surgical History:  Procedure Laterality Date   ABDOMINAL EXPLORATION SURGERY     BACK SURGERY     X'3   cardiac bypass     CARDIAC DEFIBRILLATOR PLACEMENT     CARPAL TUNNEL RELEASE     X2, bilateral   CATARACT EXTRACTION     COLONOSCOPY  06/20/2012   Procedure: COLONOSCOPY;  Surgeon: Mardella Layman, MD;  Location: [facility];  Service: Endoscopy;  Laterality: N/A;   DOPPLER ECHOCARDIOGRAPHY  2003   EP IMPLANTABLE DEVICE N/A 07/14/2016   Procedure: ICD Generator Changeout;  Surgeon: Marinus Maw, MD;  Location: [facility] INVASIVE CV LAB;  Service: Cardiovascular;  Laterality: N/A;   ESOPHAGOGASTRODUODENOSCOPY  06/20/2012  Procedure: ESOPHAGOGASTRODUODENOSCOPY (EGD);  Surgeon: Mardella Layman, MD;  Location: [facility];  Service: Endoscopy;  Laterality: N/A;   EYE SURGERY     LAPAROTOMY     RETINOPATHY SURGERY Bilateral    rotator cuff surgery     left   Patient Active Problem List   Diagnosis Date Noted   Porokeratosis 11/04/2022   Pneumatosis of intestines 07/21/2022   Diarrhea 07/21/2022   N&V (nausea and vomiting) 07/21/2022   Hav (hallux abducto valgus), left 05/06/2022   Callus 10/29/2021   Glaucoma suspect with open angle 04/02/2021   Pseudophakia of both eyes 08/20/2020   Claudication in peripheral vascular disease (HCC) 07/23/2020   Sleep apnea    Osteoarthritis    Metabolic syndrome     Erosive esophagitis    Erectile dysfunction    Calcium oxalate renal stones    Moderate nonproliferative diabetic retinopathy of both eyes without macular edema associated with type 2 diabetes mellitus (HCC) 02/20/2020   Posterior vitreous detachment of both eyes 02/20/2020   History of TIA (transient ischemic attack) 01/19/2019   Cerebral embolism with cerebral infarction 12/26/2018   CKD (chronic kidney disease) stage 4, GFR 15-29 ml/min (HCC) 11/08/2017   ICD (implantable cardioverter-defibrillator) in place 11/08/2017   Ventricular tachycardia (paroxysmal) (HCC) 07/14/2016   Essential hypertension 11/30/2015   Diabetic peripheral neuropathy (HCC) 01/31/2015   OSA on CPAP 11/05/2014   CAD (coronary artery disease)    Dual implantable cardioverter-defibrillator in situ    Hyperlipidemia    Type 2 diabetes mellitus with other specified complication (HCC)    Morbid obesity (HCC)    Ischemic cardiomyopathy    Chronic systolic heart failure (HCC)    S/P CABG (coronary artery bypass graft) 11/02/2000   ONSET DATE: 2-3 years ago  REFERRING DIAG: Recurrent falls   THERAPY DIAG:  History of falling  Muscle weakness (generalized)  Dizziness and giddiness  Rationale for Evaluation and Treatment: Rehabilitation  SUBJECTIVE:                                                                                                                                                                                             SUBJECTIVE STATEMENT: Still feeling a little swimmyheaded. Reports no obvious vertigo when arising this AM  Pt accompanied by: self  PERTINENT HISTORY: Chronic systolic heart failure, hypertension, diabetes, diabetic peripheral neuropathy, osteoarthritis, chronic kidney disease, pacemaker, and history of a TIA  PAIN:  Are you having pain? Yes: NPRS scale: no pain score provided/10 Pain location: left shoulder  with use  PRECAUTIONS: Fall  WEIGHT BEARING RESTRICTIONS:  No  FALLS: Has patient fallen in  last 6 months? Yes. Number of falls 12+  LIVING ENVIRONMENT: Lives with: lives with their spouse Lives in: House/apartment Stairs: No Has following equipment at home: Grab bars and Hurry cane  PLOF: Independent  PATIENT GOALS: improved balance  OBJECTIVE:    TODAY'S TREATMENT: 02/25/23 Activity Comments  DGI 11/24  Gait speed 1.66 ft/sec  STG  Performed, see below  Right sidelying test -right upbeating nystagmus  Left sidelying test -no nystagmus/dizziness  Right Dix-Hallpike to Right Epley -right upbeat nystagmus x 8 sec -right Epley--tolerated well  Right sidelying test No dizziness/no nystagmus  TUG test 20 sec with rollator     VESTIBULAR ASSESSMENT (02/23/23)   GENERAL OBSERVATION: reports advanced neuropathy    SYMPTOM BEHAVIOR:   Subjective history: ongoing balance issues x 2 years   Non-Vestibular symptoms: tinnitus and deficits with looking up   Type of dizziness: doesn't report any obvious dizziness   Frequency: looking up, turning head   Duration:    Aggravating factors: Induced by position change:  sometimes with sit to stand  and Induced by motion: looking up at the ceiling, turning body quickly, and turning head quickly   Relieving factors: head stationary and slow movements   Progression of symptoms: unchanged   OCULOMOTOR EXAM:   Ocular Alignment: normal   Ocular ROM: No Limitations   Spontaneous Nystagmus: absent   Gaze-Induced Nystagmus: absent   Smooth Pursuits: intact   Saccades: intact and slower when looking right to midline   Convergence/Divergence: 3 cm         VESTIBULAR - OCULAR REFLEX:    Slow VOR: Normal   VOR Cancellation: Normal   Head-Impulse Test: HIT Right: negative HIT Left: negative   Dynamic Visual Acuity: Not able to be assessed      POSITIONAL TESTING: Right Dix-Hallpike: upbeating, right nystagmus Left Dix-Hallpike: no nystagmus Right Roll Test: no nystagmus Left Roll Test: no  nystagmus    MOTION SENSITIVITY:    Motion Sensitivity Quotient  Intensity: 0 = none, 1 = Lightheaded, 2 = Mild, 3 = Moderate, 4 = Severe, 5 = Vomiting  Intensity  1. Sitting to supine   2. Supine to L side   3. Supine to R side   4. Supine to sitting   5. L Hallpike-Dix   6. Up from L    7. R Hallpike-Dix   8. Up from R    9. Sitting, head  tipped to L knee   10. Head up from L  knee   11. Sitting, head  tipped to R knee   12. Head up from R  knee   13. Sitting head turns x5   14.Sitting head nods x5   15. In stance, 180  turn to L    16. In stance, 180  turn to R     OTHOSTATICS: not done  FUNCTIONAL GAIT: Dynamic Gait Index: TBD M-CTSIB: TBD   VESTIBULAR TREATMENT:  Canalith Repositioning:   Epley Right: Number of Reps: 2 Gaze Adaptation:   TBD Habituation:   Other: TBD Other:   PATIENT EDUCATION: Education details: assessmetnt findings, tx rationale. Person educated: Patient and Child(ren) Education method: Explanation and Handouts Education comprehension: verbalized understanding    COGNITION: Overall cognitive status: Within functional limits for tasks assessed   SENSATION: Light touch: Impaired  Diminished sensation in both lower extremities  EDEMA:  No edema observed  POSTURE: forward head and decreased lumbar lordosis  LOWER EXTREMITY MMT:    MMT Right Eval Left Eval  Hip flexion 4-/5 4-/5  Hip extension    Hip abduction    Hip adduction    Hip internal rotation    Hip external rotation    Knee flexion 4-/5 4/5  Knee extension 4-/5 4-/5  Ankle dorsiflexion 3/5 3/5  Ankle plantarflexion    Ankle inversion    Ankle eversion    (Blank rows = not tested)  TRANSFERS: Assistive device utilized: Environmental consultant - 2 wheeled  Sit to stand: SBA and requires use of the armrests Stand to sit: SBA and requires use of the armrests  GAIT: Gait pattern: step through pattern, decreased step length- Right, decreased step length- Left,  decreased stride length, ataxic, decreased trunk rotation, wide BOS, poor foot clearance- Right, and poor foot clearance- Left Assistive device utilized: Quad cane small base Level of assistance: SBA Comments: utilized intermittent external support from walls and doors  FUNCTIONAL TESTS:  5 times sit to stand: 26.98 seconds with use of the armrests Timed up and go (TUG): 20.54 seconds with small based quad cane    PATIENT EDUCATION: Education details: vestibular therapy, safety, and progress with therapy Person educated: Patient Education method: Explanation Education comprehension: verbalized understanding  HOME EXERCISE PROGRAM:  GOALS: Goals reviewed with patient? Yes  SHORT TERM GOALS: Target date: 02/25/23  Patient will be independent with his initial HEP. Baseline: Goal status: IN PROGRESS  2.  Patient will improve his 5 times sit to stand time to 19 seconds or less for improved lower extremity power. Baseline: 26.25 seconds on 02/18/23; (02/25/23) 32.78 Goal status: NOT MET  3.  Patient will improve his timed up and go time to 15 seconds or less for improved functional mobility. Baseline: 24.63 seconds with Rolator on 02/18/23; (02/25/23) 20 sec Goal status: NOT MET        LONG TERM GOALS: Target date: 03/18/23  Patient will be independent with his advanced HEP. Baseline:  Goal status: IN PROGRESS  2.  Patient will improve his 5 times sit to stand time to 15 seconds or less to reduce his risk of falling. Baseline:  Goal status: IN PROGRESS  3.  Patient will improve his timed up and go time to 12 seconds or less to reduce his fall risk. Baseline:  Goal status: IN PROGRESS  4.  Patient will be able to transfer from sitting to standing with minimal to no upper extremity support. Baseline:  Goal status: IN PROGRESS  5. Pt to remain free of positional dizziness to reduce risk for falls with mobility  Baseline: +R Dix-Hallpike  Goal status: INITIAL  6.  Demo  low risk for falls per score 19/24 Dynamic Gait Index for improved safety with ambulation  Baseline: 11/24  Goal status: INITIAL  ASSESSMENT:  CLINICAL IMPRESSION: Initiated tx with DGI assessment and demo score 11/24 indicating high risk for falls. Unable to meet 5xSTS measure and TUG test but demo improved TUG time using rollator.  Demonstrates right upbeating nystagmus in right sidelying and Dix-Hallpike position.  Performed 1 rep right Epley maneuver and no nystagmus/dizziness reported in follow-up right sidelying position.  Continued sessions indicated to progress balance and mobility and address BPPV as needed.   OBJECTIVE IMPAIRMENTS: Abnormal gait, decreased activity tolerance, decreased balance, decreased mobility, difficulty walking, decreased strength, and pain.   ACTIVITY LIMITATIONS: carrying, lifting, standing, squatting, stairs, transfers, and locomotion level  PARTICIPATION LIMITATIONS: meal prep, cleaning, laundry, shopping, community activity, and yard work  PERSONAL FACTORS: Age, Fitness, Time since onset of injury/illness/exacerbation, and 3+ comorbidities:  Chronic systolic heart failure, hypertension, diabetes, diabetic peripheral neuropathy, osteoarthritis, chronic kidney disease, pacemaker, and history of a TIA  are also affecting patient's functional outcome.   REHAB POTENTIAL: Fair    CLINICAL DECISION MAKING: Unstable/unpredictable  EVALUATION COMPLEXITY: High  PLAN:  PT FREQUENCY: 2x/week  PT DURATION: 6 weeks  PLANNED INTERVENTIONS: Therapeutic exercises, Therapeutic activity, Neuromuscular re-education, Balance training, Gait training, Patient/Family education, Self Care, Stair training, Vestibular training, Canalith repositioning, Visual/preceptual remediation/compensation, Cryotherapy, Moist heat, and Re-evaluation  PLAN FOR NEXT SESSION: M-CTSIB, balance HEP  10:51 AM, 02/25/23 M. Shary Decamp, PT, DPT Physical Therapist- [place] Office Number:  [phone]

## 2023-03-01 ENCOUNTER — Encounter: Payer: Self-pay | Admitting: Nurse Practitioner

## 2023-03-01 ENCOUNTER — Ambulatory Visit (INDEPENDENT_AMBULATORY_CARE_PROVIDER_SITE_OTHER): Payer: Medicare Other

## 2023-03-01 ENCOUNTER — Ambulatory Visit: Payer: Medicare Other | Admitting: Nurse Practitioner

## 2023-03-01 VITALS — BP 118/68 | HR 81 | Temp 97.0°F | Ht 64.0 in | Wt 217.8 lb

## 2023-03-01 DIAGNOSIS — M25511 Pain in right shoulder: Secondary | ICD-10-CM | POA: Diagnosis not present

## 2023-03-01 DIAGNOSIS — W19XXXA Unspecified fall, initial encounter: Secondary | ICD-10-CM

## 2023-03-01 DIAGNOSIS — E1165 Type 2 diabetes mellitus with hyperglycemia: Secondary | ICD-10-CM | POA: Diagnosis not present

## 2023-03-01 DIAGNOSIS — Z043 Encounter for examination and observation following other accident: Secondary | ICD-10-CM | POA: Diagnosis not present

## 2023-03-01 NOTE — Progress Notes (Signed)
   Acute Office Visit  Subjective:     Patient ID: Manuel Kline, male    DOB: 04/01/1932, 89 y.o.   MRN: [id number]  Chief Complaint  Patient presents with   Shoulder Pain    Right shoulder pain after a fall on Thursday     HPI Manuel Kline is a 89 y.o. male who sustained a right, upper shoulder injury 4 day(s) ago. Mechanism of injury: fell while trying to garden hose from a sitting position. Immediate symptoms: delayed pain, delayed swelling. Symptoms have been acute since that time. Prior history of related problems: no prior problems with this area in the past.  OTC tylenol, heat and seems to help with pain  ROS Negative unless indicated in HPI    Objective:    BP 118/68   Pulse 81   Temp (!) 97 F (36.1 C) (Temporal)   Ht 5\' 4"  (1.626 m)   Wt 217 lb 12.8 oz (98.8 kg)   SpO2 95%   BMI 37.39 kg/m  BP Readings from Last 3 Encounters:  03/01/23 118/68  01/27/23 137/74  01/20/23 (!) 142/79   Wt Readings from Last 3 Encounters:  03/01/23 217 lb 12.8 oz (98.8 kg)  01/27/23 214 lb (97.1 kg)  01/20/23 214 lb 8 oz (97.3 kg)      Physical Exam Vital signs as noted above. Appearance: alert, well appearing, and in no distress and overweight. Shoulder exam: reduced range of motion of 45. X-ray: no fracture or dislocation noted. No results found for any visits on 03/01/23.      Assessment & Plan:  Fall, initial encounter -     DG Shoulder Right; Future  Acute pain of right shoulder  ASSESSMENT: Shoulder strain  PLAN: rest the injured area as much as practical, apply ice packs, elevate the injured limb, use a sling, apply heat (warned not to sleep on heating pad  X-Ray ordered, waiting for final report see primary care physician in follow up See orders for this visit as documented in the electronic medical record.   Return if symptoms worsen or fail to improve, for follow-up.  Arrie Aran Santa Lighter, DNP [facility] [address] [place], [place] [address] [phone]

## 2023-03-03 ENCOUNTER — Ambulatory Visit: Payer: Medicare Other | Attending: Family Medicine

## 2023-03-03 DIAGNOSIS — Z9181 History of falling: Secondary | ICD-10-CM | POA: Diagnosis not present

## 2023-03-03 DIAGNOSIS — R42 Dizziness and giddiness: Secondary | ICD-10-CM

## 2023-03-03 DIAGNOSIS — M6281 Muscle weakness (generalized): Secondary | ICD-10-CM

## 2023-03-03 NOTE — Therapy (Signed)
OUTPATIENT PHYSICAL THERAPY NEURO TREATMENT    Patient Name: Manuel Kline MRN: [id number] DOB:[birth year], 89 y.o., male Today's Date: 03/03/2023  REFERRING PROVIDER: Dettinger, Elige Radon, MD   END OF SESSION:  PT End of Session - 03/03/23 0837     Visit Number 8    Number of Visits 12    Date for PT Re-Evaluation 04/30/23    Authorization Type [employer] Medicare    PT Start Time 0845    PT Stop Time 0930    PT Time Calculation (min) 45 min    Equipment Utilized During Treatment Other (comment)   Rollator   Activity Tolerance Patient tolerated treatment well    Behavior During Therapy [facility] for tasks assessed/performed            Past Medical History:  Diagnosis Date   Adenomatous colon polyp 2006   CAD (coronary artery disease)    Calcium oxalate renal stones    Cardiomyopathy    Cataract    Cataract    Cerebral embolism with cerebral infarction 12/26/2018   Chronic systolic heart failure (HCC)         CKD (chronic kidney disease) stage 4, GFR 15-29 ml/min (HCC) 11/08/2017   Claudication in peripheral vascular disease (HCC) 07/23/2020   Dehydration 07/2022   Diabetes (HCC)    Diabetic peripheral neuropathy (HCC) 01/31/2015   Dual implantable cardioverter-defibrillator in situ    01/16/2002 Dr. Ladona Ridgel  RA lead  Guidant [id number] RV lead  Guidant [id number] Generator  Guidant Prism  09/02/2009 Generator change Medtronic [id number]  SN  [id number]      Erectile dysfunction    Erosive esophagitis    Essential hypertension 11/30/2015   Hemorrhoids    History of TIA (transient ischemic attack) 01/19/2019   HLD (hyperlipidemia)    HTN (hypertension)    Hyperlipidemia    Hypertensive heart disease without CHF 07/31/2011   ICD (implantable cardiac defibrillator) in place    ICD dual chamber in situ    Ischemic cardiomyopathy    EF 40% June 2013    Left-sided weakness 12/25/2018   Metabolic syndrome    Moderate nonproliferative diabetic retinopathy of both eyes without macular  edema associated with type 2 diabetes mellitus (HCC) 02/20/2020   Morbid obesity (HCC)    OSA on CPAP 11/05/2014   Osteoarthritis    Posterior vitreous detachment of both eyes 02/20/2020   Presence of automatic (implantable) cardiac defibrillator 11/08/2017   S/P CABG (coronary artery bypass graft) 11/02/2000   Sleep apnea    Stroke-like symptoms 12/26/2018   Syncope 08/06/2009   Qualifier: Diagnosis of  By: Susette Racer CMA, Jewel     Type 2 diabetes, uncontrolled, with neuropathy    Has retinopahty and neuropathy     Ventricular tachycardia (paroxysmal) (HCC) 07/14/2016   Past Surgical History:  Procedure Laterality Date   ABDOMINAL EXPLORATION SURGERY     BACK SURGERY     X'3   cardiac bypass     CARDIAC DEFIBRILLATOR PLACEMENT     CARPAL TUNNEL RELEASE     X2, bilateral   CATARACT EXTRACTION     COLONOSCOPY  06/20/2012   Procedure: COLONOSCOPY;  Surgeon: Mardella Layman, MD;  Location: [facility] ENDOSCOPY;  Service: Endoscopy;  Laterality: N/A;   DOPPLER ECHOCARDIOGRAPHY  2003   EP IMPLANTABLE DEVICE N/A 07/14/2016   Procedure: ICD Generator Changeout;  Surgeon: Marinus Maw, MD;  Location: [facility] INVASIVE CV LAB;  Service: Cardiovascular;  Laterality: N/A;   ESOPHAGOGASTRODUODENOSCOPY  06/20/2012  Procedure: ESOPHAGOGASTRODUODENOSCOPY (EGD);  Surgeon: Mardella Layman, MD;  Location: [facility];  Service: Endoscopy;  Laterality: N/A;   EYE SURGERY     LAPAROTOMY     RETINOPATHY SURGERY Bilateral    rotator cuff surgery     left   Patient Active Problem List   Diagnosis Date Noted   Fall 03/01/2023   Porokeratosis 11/04/2022   Pneumatosis of intestines 07/21/2022   Diarrhea 07/21/2022   N&V (nausea and vomiting) 07/21/2022   Hav (hallux abducto valgus), left 05/06/2022   Callus 10/29/2021   Glaucoma suspect with open angle 04/02/2021   Pseudophakia of both eyes 08/20/2020   Claudication in peripheral vascular disease (HCC) 07/23/2020   Sleep apnea    Osteoarthritis     Metabolic syndrome    Erosive esophagitis    Erectile dysfunction    Calcium oxalate renal stones    Moderate nonproliferative diabetic retinopathy of both eyes without macular edema associated with type 2 diabetes mellitus (HCC) 02/20/2020   Posterior vitreous detachment of both eyes 02/20/2020   History of TIA (transient ischemic attack) 01/19/2019   Cerebral embolism with cerebral infarction 12/26/2018   CKD (chronic kidney disease) stage 4, GFR 15-29 ml/min (HCC) 11/08/2017   ICD (implantable cardioverter-defibrillator) in place 11/08/2017   Ventricular tachycardia (paroxysmal) (HCC) 07/14/2016   Essential hypertension 11/30/2015   Diabetic peripheral neuropathy (HCC) 01/31/2015   OSA on CPAP 11/05/2014   CAD (coronary artery disease)    Dual implantable cardioverter-defibrillator in situ    Hyperlipidemia    Type 2 diabetes mellitus with other specified complication (HCC)    Morbid obesity (HCC)    Ischemic cardiomyopathy    Chronic systolic heart failure (HCC)    S/P CABG (coronary artery bypass graft) 11/02/2000   ONSET DATE: 2-3 years ago  REFERRING DIAG: Recurrent falls   THERAPY DIAG:  History of falling  Muscle weakness (generalized)  Dizziness and giddiness  Rationale for Evaluation and Treatment: Rehabilitation  SUBJECTIVE:                                                                                                                                                                                             SUBJECTIVE STATEMENT: Had a fall the other day when bending over to pick-up water hose, got dizzy coming back up and fell over onto right shoulder causing injury.  Has f/u with ortho next week. Currently reports shoulder discomfort and notes some sensory issues along right 4th-5th finger.   Pt accompanied by: self  PERTINENT HISTORY: Chronic systolic heart failure, hypertension, diabetes, diabetic peripheral neuropathy, osteoarthritis, chronic kidney  disease, pacemaker, and history  of a TIA  PAIN:  Are you having pain? Yes: NPRS scale: 5/10 Pain location: lright shoulder  at rest  PRECAUTIONS: Fall  WEIGHT BEARING RESTRICTIONS: No  FALLS: Has patient fallen in last 6 months? Yes. Number of falls 12+  LIVING ENVIRONMENT: Lives with: lives with their spouse Lives in: House/apartment Stairs: No Has following equipment at home: Grab bars and Hurry cane  PLOF: Independent  PATIENT GOALS: improved balance  OBJECTIVE:   Right shoulder currently immobilized in sling   TODAY'S TREATMENT: 03/03/23 Activity Comments  Brandt-Daroff Starting in right sidelying- right upbeat nystagmus x 8 sec. Left sidelying no symptoms or nystagmus noted -2nd rep right sidelying, no nystagmus, minimal dizziness. No issue left -3rd rep, no nystagmus, minimal dizziness 2-3/10  Pt/family education  Regarding foot-up devices due to bilat DF weakness and foot slap during gait  Standing balance -feet together: unable, min-mod A -semi-tandem min A x 15 sec -feet apart: head turns -alt stair taps 2x10 -foot on step 3x10 sec for SLS--LOB requiring assist to hold/recover             TODAY'S TREATMENT: 02/25/23 Activity Comments  DGI 11/24  Gait speed 1.66 ft/sec  STG  Performed, see below  Right sidelying test -right upbeating nystagmus  Left sidelying test -no nystagmus/dizziness  Right Dix-Hallpike to Right Epley -right upbeat nystagmus x 8 sec -right Epley--tolerated well  Right sidelying test No dizziness/no nystagmus  TUG test 20 sec with rollator    PATIENT EDUCATION: Education details: vestibular therapy, safety, and progress with therapy Person educated: Patient Education method: Explanation Education comprehension: verbalized understanding  HOME EXERCISE PROGRAM: Access Code: [email or web address] URL: [email or web address] Date: 03/03/2023 Prepared by: Shary Decamp  Exercises - Brandt-Daroff Vestibular Exercise  - 1 x daily - 7  x weekly - 3-5 reps  VESTIBULAR ASSESSMENT (02/23/23)   GENERAL OBSERVATION: reports advanced neuropathy    SYMPTOM BEHAVIOR:   Subjective history: ongoing balance issues x 2 years   Non-Vestibular symptoms: tinnitus and deficits with looking up   Type of dizziness: doesn't report any obvious dizziness   Frequency: looking up, turning head   Duration:    Aggravating factors: Induced by position change:  sometimes with sit to stand  and Induced by motion: looking up at the ceiling, turning body quickly, and turning head quickly   Relieving factors: head stationary and slow movements   Progression of symptoms: unchanged   OCULOMOTOR EXAM:   Ocular Alignment: normal   Ocular ROM: No Limitations   Spontaneous Nystagmus: absent   Gaze-Induced Nystagmus: absent   Smooth Pursuits: intact   Saccades: intact and slower when looking right to midline   Convergence/Divergence: 3 cm         VESTIBULAR - OCULAR REFLEX:    Slow VOR: Normal   VOR Cancellation: Normal   Head-Impulse Test: HIT Right: negative HIT Left: negative   Dynamic Visual Acuity: Not able to be assessed      POSITIONAL TESTING: Right Dix-Hallpike: upbeating, right nystagmus Left Dix-Hallpike: no nystagmus Right Roll Test: no nystagmus Left Roll Test: no nystagmus    MOTION SENSITIVITY:    Motion Sensitivity Quotient  Intensity: 0 = none, 1 = Lightheaded, 2 = Mild, 3 = Moderate, 4 = Severe, 5 = Vomiting  Intensity  1. Sitting to supine   2. Supine to L side   3. Supine to R side   4. Supine to sitting   5. L Hallpike-Dix   6. Up from L  7. R Hallpike-Dix   8. Up from R    9. Sitting, head  tipped to L knee   10. Head up from L  knee   11. Sitting, head  tipped to R knee   12. Head up from R  knee   13. Sitting head turns x5   14.Sitting head nods x5   15. In stance, 180  turn to L    16. In stance, 180  turn to R     OTHOSTATICS: not done  FUNCTIONAL GAIT: Dynamic Gait Index:  TBD M-CTSIB: TBD   VESTIBULAR TREATMENT:  Canalith Repositioning:   Epley Right: Number of Reps: 2 Gaze Adaptation:   TBD Habituation:   Other: TBD Other:   PATIENT EDUCATION: Education details: assessmetnt findings, tx rationale. Person educated: Patient and Child(ren) Education method: Explanation and Handouts Education comprehension: verbalized understanding    COGNITION: Overall cognitive status: Within functional limits for tasks assessed   SENSATION: Light touch: Impaired  Diminished sensation in both lower extremities  EDEMA:  No edema observed  POSTURE: forward head and decreased lumbar lordosis  LOWER EXTREMITY MMT:    MMT Right Eval Left Eval  Hip flexion 4-/5 4-/5  Hip extension    Hip abduction    Hip adduction    Hip internal rotation    Hip external rotation    Knee flexion 4-/5 4/5  Knee extension 4-/5 4-/5  Ankle dorsiflexion 3/5 3/5  Ankle plantarflexion    Ankle inversion    Ankle eversion    (Blank rows = not tested)  TRANSFERS: Assistive device utilized: Environmental consultant - 2 wheeled  Sit to stand: SBA and requires use of the armrests Stand to sit: SBA and requires use of the armrests  GAIT: Gait pattern: step through pattern, decreased step length- Right, decreased step length- Left, decreased stride length, ataxic, decreased trunk rotation, wide BOS, poor foot clearance- Right, and poor foot clearance- Left Assistive device utilized: Quad cane small base Level of assistance: SBA Comments: utilized intermittent external support from walls and doors  FUNCTIONAL TESTS:  5 times sit to stand: 26.98 seconds with use of the armrests Timed up and go (TUG): 20.54 seconds with small based quad cane     GOALS: Goals reviewed with patient? Yes  SHORT TERM GOALS: Target date: 02/25/23  Patient will be independent with his initial HEP. Baseline: Goal status: IN PROGRESS  2.  Patient will improve his 5 times sit to stand time to 19 seconds  or less for improved lower extremity power. Baseline: 26.25 seconds on 02/18/23; (02/25/23) 32.78 Goal status: NOT MET  3.  Patient will improve his timed up and go time to 15 seconds or less for improved functional mobility. Baseline: 24.63 seconds with Rolator on 02/18/23; (02/25/23) 20 sec Goal status: NOT MET        LONG TERM GOALS: Target date: 03/18/23  Patient will be independent with his advanced HEP. Baseline:  Goal status: IN PROGRESS  2.  Patient will improve his 5 times sit to stand time to 15 seconds or less to reduce his risk of falling. Baseline:  Goal status: IN PROGRESS  3.  Patient will improve his timed up and go time to 12 seconds or less to reduce his fall risk. Baseline:  Goal status: IN PROGRESS  4.  Patient will be able to transfer from sitting to standing with minimal to no upper extremity support. Baseline:  Goal status: IN PROGRESS  5. Pt to remain free of  positional dizziness to reduce risk for falls with mobility  Baseline: +R Dix-Hallpike  Goal status: INITIAL  6.  Demo low risk for falls per score 19/24 Dynamic Gait Index for improved safety with ambulation  Baseline: 11/24  Goal status: INITIAL  ASSESSMENT:  CLINICAL IMPRESSION: Pt experienced recent fall onto right shoulder which was brought on by bending over to pick up hose from ground and experienced episode of dizziness and subsequent LOB.  Pt instructed in Brandt-Daroff for self-positioning/habituation with instruction to bias right sidelying first which triggers BPPV and then perform subsequent repetitions.  Demo favorable response as nystagmus and report of dizziness minimized after one repetition.  Required assist for each position due to right shoulder and general reduced mobility lifting LE and assist to sit.  Standing balance initiated with attempts at narrow BOS but unable to maintain any length of time. Additional balance and head movement activities to improve stability and provide  for habituation.  Demo significant tib anterior weakness and foot slap R>L in initial contact and would benefit from foot-up devices to improve safety with gait and transfers. Demo of various devices to pt family member for discussion  OBJECTIVE IMPAIRMENTS: Abnormal gait, decreased activity tolerance, decreased balance, decreased mobility, difficulty walking, decreased strength, and pain.   ACTIVITY LIMITATIONS: carrying, lifting, standing, squatting, stairs, transfers, and locomotion level  PARTICIPATION LIMITATIONS: meal prep, cleaning, laundry, shopping, community activity, and yard work  PERSONAL FACTORS: Age, Fitness, Time since onset of injury/illness/exacerbation, and 3+ comorbidities: Chronic systolic heart failure, hypertension, diabetes, diabetic peripheral neuropathy, osteoarthritis, chronic kidney disease, pacemaker, and history of a TIA  are also affecting patient's functional outcome.   REHAB POTENTIAL: Fair    CLINICAL DECISION MAKING: Unstable/unpredictable  EVALUATION COMPLEXITY: High  PLAN:  PT FREQUENCY: 2x/week  PT DURATION: 6 weeks  PLANNED INTERVENTIONS: Therapeutic exercises, Therapeutic activity, Neuromuscular re-education, Balance training, Gait training, Patient/Family education, Self Care, Stair training, Vestibular training, Canalith repositioning, Visual/preceptual remediation/compensation, Cryotherapy, Moist heat, and Re-evaluation  PLAN FOR NEXT SESSION: , balance HEP  8:38 AM, 03/03/23 M. Shary Decamp, PT, DPT Physical Therapist- [place] Office Number: [phone]

## 2023-03-05 ENCOUNTER — Ambulatory Visit: Payer: Medicare Other

## 2023-03-05 DIAGNOSIS — M6281 Muscle weakness (generalized): Secondary | ICD-10-CM

## 2023-03-05 DIAGNOSIS — Z9181 History of falling: Secondary | ICD-10-CM

## 2023-03-05 DIAGNOSIS — R42 Dizziness and giddiness: Secondary | ICD-10-CM | POA: Diagnosis not present

## 2023-03-05 NOTE — Therapy (Signed)
OUTPATIENT PHYSICAL THERAPY NEURO TREATMENT    Patient Name: Manuel Kline MRN: [id number] DOB:[birth year], 89 y.o., male Today's Date: 03/05/2023  REFERRING PROVIDER: Dettinger, Elige Radon, MD   END OF SESSION:  PT End of Session - 03/05/23 1017     Visit Number 9    Number of Visits 12    Date for PT Re-Evaluation 04/30/23    Authorization Type [employer] Medicare    PT Start Time 1015    PT Stop Time 1100    PT Time Calculation (min) 45 min    Equipment Utilized During Treatment Other (comment)   Rollator   Activity Tolerance Patient tolerated treatment well    Behavior During Therapy [facility] for tasks assessed/performed            Past Medical History:  Diagnosis Date   Adenomatous colon polyp 2006   CAD (coronary artery disease)    Calcium oxalate renal stones    Cardiomyopathy    Cataract    Cataract    Cerebral embolism with cerebral infarction 12/26/2018   Chronic systolic heart failure (HCC)         CKD (chronic kidney disease) stage 4, GFR 15-29 ml/min (HCC) 11/08/2017   Claudication in peripheral vascular disease (HCC) 07/23/2020   Dehydration 07/2022   Diabetes (HCC)    Diabetic peripheral neuropathy (HCC) 01/31/2015   Dual implantable cardioverter-defibrillator in situ    01/16/2002 Dr. Ladona Ridgel  RA lead  Guidant [id number] RV lead  Guidant [id number] Generator  Guidant Prism  09/02/2009 Generator change Medtronic [id number]  SN  [id number]      Erectile dysfunction    Erosive esophagitis    Essential hypertension 11/30/2015   Hemorrhoids    History of TIA (transient ischemic attack) 01/19/2019   HLD (hyperlipidemia)    HTN (hypertension)    Hyperlipidemia    Hypertensive heart disease without CHF 07/31/2011   ICD (implantable cardiac defibrillator) in place    ICD dual chamber in situ    Ischemic cardiomyopathy    EF 40% June 2013    Left-sided weakness 12/25/2018   Metabolic syndrome    Moderate nonproliferative diabetic retinopathy of both eyes without macular  edema associated with type 2 diabetes mellitus (HCC) 02/20/2020   Morbid obesity (HCC)    OSA on CPAP 11/05/2014   Osteoarthritis    Posterior vitreous detachment of both eyes 02/20/2020   Presence of automatic (implantable) cardiac defibrillator 11/08/2017   S/P CABG (coronary artery bypass graft) 11/02/2000   Sleep apnea    Stroke-like symptoms 12/26/2018   Syncope 08/06/2009   Qualifier: Diagnosis of  By: Susette Racer CMA, Jewel     Type 2 diabetes, uncontrolled, with neuropathy    Has retinopahty and neuropathy     Ventricular tachycardia (paroxysmal) (HCC) 07/14/2016   Past Surgical History:  Procedure Laterality Date   ABDOMINAL EXPLORATION SURGERY     BACK SURGERY     X'3   cardiac bypass     CARDIAC DEFIBRILLATOR PLACEMENT     CARPAL TUNNEL RELEASE     X2, bilateral   CATARACT EXTRACTION     COLONOSCOPY  06/20/2012   Procedure: COLONOSCOPY;  Surgeon: Mardella Layman, MD;  Location: [facility]OS[facility];  Service: Endoscopy;  Laterality: N/A;   DOPPLER ECHOCARDIOGRAPHY  2003   EP IMPLANTABLE DEVICE N/A 07/14/2016   Procedure: ICD Generator Changeout;  Surgeon: Marinus Maw, MD;  Location: [facility] INVASIVE CV LAB;  Service: Cardiovascular;  Laterality: N/A;   ESOPHAGOGASTRODUODENOSCOPY  06/20/2012  Procedure: ESOPHAGOGASTRODUODENOSCOPY (EGD);  Surgeon: Mardella Layman, MD;  Location: [facility];  Service: Endoscopy;  Laterality: N/A;   EYE SURGERY     LAPAROTOMY     RETINOPATHY SURGERY Bilateral    rotator cuff surgery     left   Patient Active Problem List   Diagnosis Date Noted   Fall 03/01/2023   Porokeratosis 11/04/2022   Pneumatosis of intestines 07/21/2022   Diarrhea 07/21/2022   N&V (nausea and vomiting) 07/21/2022   Hav (hallux abducto valgus), left 05/06/2022   Callus 10/29/2021   Glaucoma suspect with open angle 04/02/2021   Pseudophakia of both eyes 08/20/2020   Claudication in peripheral vascular disease (HCC) 07/23/2020   Sleep apnea    Osteoarthritis     Metabolic syndrome    Erosive esophagitis    Erectile dysfunction    Calcium oxalate renal stones    Moderate nonproliferative diabetic retinopathy of both eyes without macular edema associated with type 2 diabetes mellitus (HCC) 02/20/2020   Posterior vitreous detachment of both eyes 02/20/2020   History of TIA (transient ischemic attack) 01/19/2019   Cerebral embolism with cerebral infarction 12/26/2018   CKD (chronic kidney disease) stage 4, GFR 15-29 ml/min (HCC) 11/08/2017   ICD (implantable cardioverter-defibrillator) in place 11/08/2017   Ventricular tachycardia (paroxysmal) (HCC) 07/14/2016   Essential hypertension 11/30/2015   Diabetic peripheral neuropathy (HCC) 01/31/2015   OSA on CPAP 11/05/2014   CAD (coronary artery disease)    Dual implantable cardioverter-defibrillator in situ    Hyperlipidemia    Type 2 diabetes mellitus with other specified complication (HCC)    Morbid obesity (HCC)    Ischemic cardiomyopathy    Chronic systolic heart failure (HCC)    S/P CABG (coronary artery bypass graft) 11/02/2000   ONSET DATE: 2-3 years ago  REFERRING DIAG: Recurrent falls   THERAPY DIAG:  History of falling  Muscle weakness (generalized)  Dizziness and giddiness  Rationale for Evaluation and Treatment: Rehabilitation  SUBJECTIVE:                                                                                                                                                                                             SUBJECTIVE STATEMENT: Shoulder is feeling a little better. Woke up clear as crystal today   Pt accompanied by: self  PERTINENT HISTORY: Chronic systolic heart failure, hypertension, diabetes, diabetic peripheral neuropathy, osteoarthritis, chronic kidney disease, pacemaker, and history of a TIA  PAIN:  Are you having pain? Yes: NPRS scale: 5/10 Pain location: lright shoulder  at rest  PRECAUTIONS: Fall  WEIGHT BEARING RESTRICTIONS: No  FALLS: Has  patient fallen  in last 6 months? Yes. Number of falls 12+  LIVING ENVIRONMENT: Lives with: lives with their spouse Lives in: House/apartment Stairs: No Has following equipment at home: Grab bars and Hurry cane  PLOF: Independent  PATIENT GOALS: improved balance  OBJECTIVE:   TODAY'S TREATMENT: 03/05/23 Activity Comments  Right sidelying test Right upbeating nystagmus x 5 sec  Brandt-Daroff: assist w/ BLE and guarding right shoulder due to recent injury 1st rep: no response in left sidelying 2nd rep right: brief 3 sec 3rd rep: no nystagmus on right 4th rep: no nystagmus, no dizziness  Seated VOR x 1 -no symptoms and no saccadic intrusions noted  balance -sidestepping along counter x 2 min -alt toe taps 1x10 -static standing EO x 30 sec -LUE support: EC x 30 sec, head turns EO/EC 3x -foot on step (cabinet) 3x15 sec  -semi-tandem 3x15 sec -retrowalking x 2 min -sit to stand 2x10 eyes closed BUE support            TODAY'S TREATMENT: 03/03/23 Activity Comments  Brandt-Daroff Starting in right sidelying- right upbeat nystagmus x 8 sec. Left sidelying no symptoms or nystagmus noted -2nd rep right sidelying, no nystagmus, minimal dizziness. No issue left -3rd rep, no nystagmus, minimal dizziness 2-3/10  Pt/family education  Regarding foot-up devices due to bilat DF weakness and foot slap during gait  Standing balance -feet together: unable, min-mod A -semi-tandem min A x 15 sec -feet apart: head turns -alt stair taps 2x10 -foot on step 3x10 sec for SLS--LOB requiring assist to hold/recover                PATIENT EDUCATION: Education details: vestibular therapy, safety, and progress with therapy Person educated: Patient Education method: Explanation Education comprehension: verbalized understanding  HOME EXERCISE PROGRAM: Access Code: [email or web address] URL: [email or web address] Date: 03/03/2023 Prepared by: Shary Decamp  Exercises - Brandt-Daroff Vestibular  Exercise  - 1 x daily - 7 x weekly - 3-5 reps -Sit to stand at sink 3x10 eyes closed w/ UE support  VESTIBULAR ASSESSMENT (02/23/23)   GENERAL OBSERVATION: reports advanced neuropathy    SYMPTOM BEHAVIOR:   Subjective history: ongoing balance issues x 2 years   Non-Vestibular symptoms: tinnitus and deficits with looking up   Type of dizziness: doesn't report any obvious dizziness   Frequency: looking up, turning head   Duration:    Aggravating factors: Induced by position change:  sometimes with sit to stand  and Induced by motion: looking up at the ceiling, turning body quickly, and turning head quickly   Relieving factors: head stationary and slow movements   Progression of symptoms: unchanged   OCULOMOTOR EXAM:   Ocular Alignment: normal   Ocular ROM: No Limitations   Spontaneous Nystagmus: absent   Gaze-Induced Nystagmus: absent   Smooth Pursuits: intact   Saccades: intact and slower when looking right to midline   Convergence/Divergence: 3 cm         VESTIBULAR - OCULAR REFLEX:    Slow VOR: Normal   VOR Cancellation: Normal   Head-Impulse Test: HIT Right: negative HIT Left: negative   Dynamic Visual Acuity: Not able to be assessed      POSITIONAL TESTING: Right Dix-Hallpike: upbeating, right nystagmus Left Dix-Hallpike: no nystagmus Right Roll Test: no nystagmus Left Roll Test: no nystagmus    MOTION SENSITIVITY:    Motion Sensitivity Quotient  Intensity: 0 = none, 1 = Lightheaded, 2 = Mild, 3 = Moderate, 4 = Severe, 5 = Vomiting  Intensity  1. Sitting to supine  2. Supine to L side   3. Supine to R side   4. Supine to sitting   5. L Hallpike-Dix   6. Up from L    7. R Hallpike-Dix   8. Up from R    9. Sitting, head  tipped to L knee   10. Head up from L  knee   11. Sitting, head  tipped to R knee   12. Head up from R  knee   13. Sitting head turns x5   14.Sitting head nods x5   15. In stance, 180  turn to L    16. In stance, 180  turn to  R     OTHOSTATICS: not done  FUNCTIONAL GAIT: Dynamic Gait Index: TBD M-CTSIB: TBD   VESTIBULAR TREATMENT:  Canalith Repositioning:   Epley Right: Number of Reps: 2 Gaze Adaptation:   TBD Habituation:   Other: TBD Other:   PATIENT EDUCATION: Education details: assessmetnt findings, tx rationale. Person educated: Patient and Child(ren) Education method: Explanation and Handouts Education comprehension: verbalized understanding    COGNITION: Overall cognitive status: Within functional limits for tasks assessed   SENSATION: Light touch: Impaired  Diminished sensation in both lower extremities  EDEMA:  No edema observed  POSTURE: forward head and decreased lumbar lordosis  LOWER EXTREMITY MMT:    MMT Right Eval Left Eval  Hip flexion 4-/5 4-/5  Hip extension    Hip abduction    Hip adduction    Hip internal rotation    Hip external rotation    Knee flexion 4-/5 4/5  Knee extension 4-/5 4-/5  Ankle dorsiflexion 3/5 3/5  Ankle plantarflexion    Ankle inversion    Ankle eversion    (Blank rows = not tested)  TRANSFERS: Assistive device utilized: Environmental consultant - 2 wheeled  Sit to stand: SBA and requires use of the armrests Stand to sit: SBA and requires use of the armrests  GAIT: Gait pattern: step through pattern, decreased step length- Right, decreased step length- Left, decreased stride length, ataxic, decreased trunk rotation, wide BOS, poor foot clearance- Right, and poor foot clearance- Left Assistive device utilized: Quad cane small base Level of assistance: SBA Comments: utilized intermittent external support from walls and doors  FUNCTIONAL TESTS:  5 times sit to stand: 26.98 seconds with use of the armrests Timed up and go (TUG): 20.54 seconds with small based quad cane     GOALS: Goals reviewed with patient? Yes  SHORT TERM GOALS: Target date: 02/25/23  Patient will be independent with his initial HEP. Baseline: Goal status: IN  PROGRESS  2.  Patient will improve his 5 times sit to stand time to 19 seconds or less for improved lower extremity power. Baseline: 26.25 seconds on 02/18/23; (02/25/23) 32.78 Goal status: NOT MET  3.  Patient will improve his timed up and go time to 15 seconds or less for improved functional mobility. Baseline: 24.63 seconds with Rolator on 02/18/23; (02/25/23) 20 sec Goal status: NOT MET        LONG TERM GOALS: Target date: 03/18/23  Patient will be independent with his advanced HEP. Baseline:  Goal status: IN PROGRESS  2.  Patient will improve his 5 times sit to stand time to 15 seconds or less to reduce his risk of falling. Baseline:  Goal status: IN PROGRESS  3.  Patient will improve his timed up and go time to 12 seconds or less to reduce his fall risk. Baseline:  Goal status: IN PROGRESS  4.  Patient will be able to transfer from sitting to standing with minimal to no upper extremity support. Baseline:  Goal status: IN PROGRESS  5. Pt to remain free of positional dizziness to reduce risk for falls with mobility  Baseline: +R Dix-Hallpike  Goal status: INITIAL  6.  Demo low risk for falls per score 19/24 Dynamic Gait Index for improved safety with ambulation  Baseline: 11/24  Goal status: INITIAL  ASSESSMENT:  CLINICAL IMPRESSION: Initiated tx with right sidelying test which reveals right upbeating nystagmus with short duration of 5-8 sec. Repeated Brandt-Daroff for reps and ultimately no dizziness/nystagmus present by 4th repetition. Continued with activities to improve standing balance and postural stability with multi-sensory balance activities and incorporating head turns for added challenge to vestibular system.  Pt denies any provoked symptoms with standing head movements. Continued sessions indicated to progress POC details and reduce risk for falls  OBJECTIVE IMPAIRMENTS: Abnormal gait, decreased activity tolerance, decreased balance, decreased mobility,  difficulty walking, decreased strength, and pain.   ACTIVITY LIMITATIONS: carrying, lifting, standing, squatting, stairs, transfers, and locomotion level  PARTICIPATION LIMITATIONS: meal prep, cleaning, laundry, shopping, community activity, and yard work  PERSONAL FACTORS: Age, Fitness, Time since onset of injury/illness/exacerbation, and 3+ comorbidities: Chronic systolic heart failure, hypertension, diabetes, diabetic peripheral neuropathy, osteoarthritis, chronic kidney disease, pacemaker, and history of a TIA  are also affecting patient's functional outcome.   REHAB POTENTIAL: Fair    CLINICAL DECISION MAKING: Unstable/unpredictable  EVALUATION COMPLEXITY: High  PLAN:  PT FREQUENCY: 2x/week  PT DURATION: 6 weeks  PLANNED INTERVENTIONS: Therapeutic exercises, Therapeutic activity, Neuromuscular re-education, Balance training, Gait training, Patient/Family education, Self Care, Stair training, Vestibular training, Canalith repositioning, Visual/preceptual remediation/compensation, Cryotherapy, Moist heat, and Re-evaluation  PLAN FOR NEXT SESSION: , balance HEP  10:17 AM, 03/05/23 M. Shary Decamp, PT, DPT Physical Therapist- [place] Office Number: [phone]

## 2023-03-08 ENCOUNTER — Telehealth: Payer: Self-pay | Admitting: Family Medicine

## 2023-03-08 NOTE — Telephone Encounter (Signed)
Patient came in the office to speak with Raynelle Fanning about Trulicity. Made him aware that she is off on Monday's. He wanted to make an appt with her, please call back to schedule.    Patient stated that he is out of medication and gets it from [facility]. Please call back if there are any issues for him getting medication.

## 2023-03-09 ENCOUNTER — Ambulatory Visit: Payer: Medicare Other

## 2023-03-09 ENCOUNTER — Encounter: Payer: Self-pay | Admitting: Family Medicine

## 2023-03-09 DIAGNOSIS — R42 Dizziness and giddiness: Secondary | ICD-10-CM | POA: Diagnosis not present

## 2023-03-09 DIAGNOSIS — M6281 Muscle weakness (generalized): Secondary | ICD-10-CM

## 2023-03-09 DIAGNOSIS — Z9181 History of falling: Secondary | ICD-10-CM

## 2023-03-09 NOTE — Therapy (Signed)
OUTPATIENT PHYSICAL THERAPY NEURO TREATMENT and Progress Note   Patient Name: Manuel Kline MRN: [id number] DOB:[birth year], 89 y.o., male Today's Date: 03/09/2023  REFERRING PROVIDER: Dettinger, Elige Radon, MD   Progress Note Reporting Period 02/04/23 to 03/09/23  See note below for Objective Data and Assessment of Progress/Goals.      END OF SESSION:  PT End of Session - 03/09/23 1022     Visit Number 10    Number of Visits 12    Date for PT Re-Evaluation 04/30/23    Authorization Type [employer] Medicare    PT Start Time 1015    PT Stop Time 1100    PT Time Calculation (min) 45 min    Equipment Utilized During Treatment Other (comment)   Rollator   Activity Tolerance Patient tolerated treatment well    Behavior During Therapy [facility] for tasks assessed/performed            Past Medical History:  Diagnosis Date   Adenomatous colon polyp 2006   CAD (coronary artery disease)    Calcium oxalate renal stones    Cardiomyopathy    Cataract    Cataract    Cerebral embolism with cerebral infarction 12/26/2018   Chronic systolic heart failure (HCC)         CKD (chronic kidney disease) stage 4, GFR 15-29 ml/min (HCC) 11/08/2017   Claudication in peripheral vascular disease (HCC) 07/23/2020   Dehydration 07/2022   Diabetes (HCC)    Diabetic peripheral neuropathy (HCC) 01/31/2015   Dual implantable cardioverter-defibrillator in situ    01/16/2002 Dr. Ladona Ridgel  RA lead  Guidant [id number] RV lead  Guidant [id number] Generator  Guidant Prism  09/02/2009 Generator change Medtronic [id number]  SN  [id number]      Erectile dysfunction    Erosive esophagitis    Essential hypertension 11/30/2015   Hemorrhoids    History of TIA (transient ischemic attack) 01/19/2019   HLD (hyperlipidemia)    HTN (hypertension)    Hyperlipidemia    Hypertensive heart disease without CHF 07/31/2011   ICD (implantable cardiac defibrillator) in place    ICD dual chamber in situ    Ischemic cardiomyopathy    EF 40%  June 2013    Left-sided weakness 12/25/2018   Metabolic syndrome    Moderate nonproliferative diabetic retinopathy of both eyes without macular edema associated with type 2 diabetes mellitus (HCC) 02/20/2020   Morbid obesity (HCC)    OSA on CPAP 11/05/2014   Osteoarthritis    Posterior vitreous detachment of both eyes 02/20/2020   Presence of automatic (implantable) cardiac defibrillator 11/08/2017   S/P CABG (coronary artery bypass graft) 11/02/2000   Sleep apnea    Stroke-like symptoms 12/26/2018   Syncope 08/06/2009   Qualifier: Diagnosis of  By: Susette Racer CMA, Jewel     Type 2 diabetes, uncontrolled, with neuropathy    Has retinopahty and neuropathy     Ventricular tachycardia (paroxysmal) (HCC) 07/14/2016   Past Surgical History:  Procedure Laterality Date   ABDOMINAL EXPLORATION SURGERY     BACK SURGERY     X'3   cardiac bypass     CARDIAC DEFIBRILLATOR PLACEMENT     CARPAL TUNNEL RELEASE     X2, bilateral   CATARACT EXTRACTION     COLONOSCOPY  06/20/2012   Procedure: COLONOSCOPY;  Surgeon: Mardella Layman, MD;  Location: [facility];  Service: Endoscopy;  Laterality: N/A;   DOPPLER ECHOCARDIOGRAPHY  2003   EP IMPLANTABLE DEVICE N/A 07/14/2016   Procedure: ICD  Musician;  Surgeon: Marinus Maw, MD;  [facility]: [facility] INVASIVE CV LAB;  Service: Cardiovascular;  Laterality: N/A;   ESOPHAGOGASTRODUODENOSCOPY  06/20/2012   Procedure: ESOPHAGOGASTRODUODENOSCOPY (EGD);  Surgeon: Mardella Layman, MD;  Location: [facility];  Service: Endoscopy;  Laterality: N/A;   EYE SURGERY     LAPAROTOMY     RETINOPATHY SURGERY Bilateral    rotator cuff surgery     left   Patient Active Problem List   Diagnosis Date Noted   Fall 03/01/2023   Porokeratosis 11/04/2022   Pneumatosis of intestines 07/21/2022   Diarrhea 07/21/2022   N&V (nausea and vomiting) 07/21/2022   Hav (hallux abducto valgus), left 05/06/2022   Callus 10/29/2021   Glaucoma suspect with open angle  04/02/2021   Pseudophakia of both eyes 08/20/2020   Claudication in peripheral vascular disease (HCC) 07/23/2020   Sleep apnea    Osteoarthritis    Metabolic syndrome    Erosive esophagitis    Erectile dysfunction    Calcium oxalate renal stones    Moderate nonproliferative diabetic retinopathy of both eyes without macular edema associated with type 2 diabetes mellitus (HCC) 02/20/2020   Posterior vitreous detachment of both eyes 02/20/2020   History of TIA (transient ischemic attack) 01/19/2019   Cerebral embolism with cerebral infarction 12/26/2018   CKD (chronic kidney disease) stage 4, GFR 15-29 ml/min (HCC) 11/08/2017   ICD (implantable cardioverter-defibrillator) in place 11/08/2017   Ventricular tachycardia (paroxysmal) (HCC) 07/14/2016   Essential hypertension 11/30/2015   Diabetic peripheral neuropathy (HCC) 01/31/2015   OSA on CPAP 11/05/2014   CAD (coronary artery disease)    Dual implantable cardioverter-defibrillator in situ    Hyperlipidemia    Type 2 diabetes mellitus with other specified complication (HCC)    Morbid obesity (HCC)    Ischemic cardiomyopathy    Chronic systolic heart failure (HCC)    S/P CABG (coronary artery bypass graft) 11/02/2000   ONSET DATE: 2-3 years ago  REFERRING DIAG: Recurrent falls   THERAPY DIAG:  History of falling  Muscle weakness (generalized)  Dizziness and giddiness  Rationale for Evaluation and Treatment: Rehabilitation  SUBJECTIVE:                                                                                                                                                                                             SUBJECTIVE STATEMENT: Going to see orthopedist tomorrow for right shoulder.  Dizziness seems to be somewhat improved, doing Brandt-Daroff in the AM.    Pt accompanied by: self  PERTINENT HISTORY: Chronic systolic heart failure, hypertension, diabetes, diabetic peripheral neuropathy, osteoarthritis, chronic  kidney disease, pacemaker,  and history of a TIA  PAIN:  Are you having pain? Yes: NPRS scale: 5/10 Pain location: lright shoulder  at rest  PRECAUTIONS: Fall  WEIGHT BEARING RESTRICTIONS: No  FALLS: Has patient fallen in last 6 months? Yes. Number of falls 12+  LIVING ENVIRONMENT: Lives with: lives with their spouse Lives in: House/apartment Stairs: No Has following equipment at home: Grab bars and Hurry cane  PLOF: Independent  PATIENT GOALS: improved balance  OBJECTIVE:   TODAY'S TREATMENT: 03/09/23 Activity Comments  Brandt-Daroff   5xSTS 17 sec  TUG test 16 sec w/ rollator  Standing balance   Sidestepping x 2 min   Alt step taps x 60 sec 5#  Lateral step over 20x 5#  LAQ 3x10         PATIENT EDUCATION: Education details: vestibular therapy, safety, and progress with therapy Person educated: Patient Education method: Explanation Education comprehension: verbalized understanding  HOME EXERCISE PROGRAM: Access Code: [email or web address] URL: [email or web address] Date: 03/03/2023 Prepared by: Shary Decamp  Exercises - Brandt-Daroff Vestibular Exercise  - 1 x daily - 7 x weekly - 3-5 reps -Sit to stand at sink 3x10 eyes closed w/ UE support  VESTIBULAR ASSESSMENT (02/23/23)   GENERAL OBSERVATION: reports advanced neuropathy    SYMPTOM BEHAVIOR:   Subjective history: ongoing balance issues x 2 years   Non-Vestibular symptoms: tinnitus and deficits with looking up   Type of dizziness: doesn't report any obvious dizziness   Frequency: looking up, turning head   Duration:    Aggravating factors: Induced by position change:  sometimes with sit to stand  and Induced by motion: looking up at the ceiling, turning body quickly, and turning head quickly   Relieving factors: head stationary and slow movements   Progression of symptoms: unchanged   OCULOMOTOR EXAM:   Ocular Alignment: normal   Ocular ROM: No Limitations   Spontaneous Nystagmus:  absent   Gaze-Induced Nystagmus: absent   Smooth Pursuits: intact   Saccades: intact and slower when looking right to midline   Convergence/Divergence: 3 cm         VESTIBULAR - OCULAR REFLEX:    Slow VOR: Normal   VOR Cancellation: Normal   Head-Impulse Test: HIT Right: negative HIT Left: negative   Dynamic Visual Acuity: Not able to be assessed      POSITIONAL TESTING: Right Dix-Hallpike: upbeating, right nystagmus Left Dix-Hallpike: no nystagmus Right Roll Test: no nystagmus Left Roll Test: no nystagmus    MOTION SENSITIVITY:    Motion Sensitivity Quotient  Intensity: 0 = none, 1 = Lightheaded, 2 = Mild, 3 = Moderate, 4 = Severe, 5 = Vomiting  Intensity  1. Sitting to supine   2. Supine to L side   3. Supine to R side   4. Supine to sitting   5. L Hallpike-Dix   6. Up from L    7. R Hallpike-Dix   8. Up from R    9. Sitting, head  tipped to L knee   10. Head up from L  knee   11. Sitting, head  tipped to R knee   12. Head up from R  knee   13. Sitting head turns x5   14.Sitting head nods x5   15. In stance, 180  turn to L    16. In stance, 180  turn to R     OTHOSTATICS: not done  FUNCTIONAL GAIT: Dynamic Gait Index: TBD M-CTSIB: TBD   VESTIBULAR TREATMENT:  Canalith Repositioning:   Epley Right:  Number of Reps: 2 Gaze Adaptation:   TBD Habituation:   Other: TBD Other:   PATIENT EDUCATION: Education details: assessmetnt findings, tx rationale. Person educated: Patient and Child(ren) Education method: Explanation and Handouts Education comprehension: verbalized understanding    COGNITION: Overall cognitive status: Within functional limits for tasks assessed   SENSATION: Light touch: Impaired  Diminished sensation in both lower extremities  EDEMA:  No edema observed  POSTURE: forward head and decreased lumbar lordosis  LOWER EXTREMITY MMT:    MMT Right Eval Left Eval  Hip flexion 4-/5 4-/5  Hip extension    Hip  abduction    Hip adduction    Hip internal rotation    Hip external rotation    Knee flexion 4-/5 4/5  Knee extension 4-/5 4-/5  Ankle dorsiflexion 3/5 3/5  Ankle plantarflexion    Ankle inversion    Ankle eversion    (Blank rows = not tested)  TRANSFERS: Assistive device utilized: Environmental consultant - 2 wheeled  Sit to stand: SBA and requires use of the armrests Stand to sit: SBA and requires use of the armrests  GAIT: Gait pattern: step through pattern, decreased step length- Right, decreased step length- Left, decreased stride length, ataxic, decreased trunk rotation, wide BOS, poor foot clearance- Right, and poor foot clearance- Left Assistive device utilized: Quad cane small base Level of assistance: SBA Comments: utilized intermittent external support from walls and doors  FUNCTIONAL TESTS:  5 times sit to stand: 26.98 seconds with use of the armrests Timed up and go (TUG): 20.54 seconds with small based quad cane     GOALS: Goals reviewed with patient? Yes  SHORT TERM GOALS: Target date: 02/25/23  Patient will be independent with his initial HEP. Baseline: Goal status: MET  2.  Patient will improve his 5 times sit to stand time to 19 seconds or less for improved lower extremity power. Baseline: 26.25 seconds on 02/18/23; (02/25/23) 32.78; (03/09/23) 17 sec Goal status: MET  3.  Patient will improve his timed up and go time to 15 seconds or less for improved functional mobility. Baseline: 24.63 seconds with Rolator on 02/18/23; (02/25/23) 20 sec; (03/09/23) 16.8 sec w/ rollator Goal status: NOT MET        LONG TERM GOALS: Target date: 03/18/23  Patient will be independent with his advanced HEP. Baseline:  Goal status: IN PROGRESS  2.  Patient will improve his 5 times sit to stand time to 15 seconds or less to reduce his risk of falling. Baseline:  Goal status: IN PROGRESS  3.  Patient will improve his timed up and go time to 12 seconds or less to reduce his fall  risk. Baseline:  Goal status: IN PROGRESS  4.  Patient will be able to transfer from sitting to standing with minimal to no upper extremity support. Baseline:  Goal status: IN PROGRESS  5. Pt to remain free of positional dizziness to reduce risk for falls with mobility  Baseline: +R Dix-Hallpike  Goal status: INITIAL  6.  Demo low risk for falls per score 19/24 Dynamic Gait Index for improved safety with ambulation  Baseline: 11/24  Goal status: INITIAL  ASSESSMENT:  CLINICAL IMPRESSION: Pt reports compliance with Brandt-Daroff in AM and reports that this has helped in staving off dizzy episodes later in the day.  Initiated with Brandt-Daroff and no nystagmus or dizziness observed/reported except one transient episode in right sidelying.  Completion of STG/LTG review for progress note and demonstrates global improvements with mobility per 5xSTS  and TUG test measures.  Retro-LOB with vertical head movements and eyes closed conditions  OBJECTIVE IMPAIRMENTS: Abnormal gait, decreased activity tolerance, decreased balance, decreased mobility, difficulty walking, decreased strength, and pain.   ACTIVITY LIMITATIONS: carrying, lifting, standing, squatting, stairs, transfers, and locomotion level  PARTICIPATION LIMITATIONS: meal prep, cleaning, laundry, shopping, community activity, and yard work  PERSONAL FACTORS: Age, Fitness, Time since onset of injury/illness/exacerbation, and 3+ comorbidities: Chronic systolic heart failure, hypertension, diabetes, diabetic peripheral neuropathy, osteoarthritis, chronic kidney disease, pacemaker, and history of a TIA  are also affecting patient's functional outcome.   REHAB POTENTIAL: Fair    CLINICAL DECISION MAKING: Unstable/unpredictable  EVALUATION COMPLEXITY: High  PLAN:  PT FREQUENCY: 2x/week  PT DURATION: 6 weeks  PLANNED INTERVENTIONS: Therapeutic exercises, Therapeutic activity, Neuromuscular re-education, Balance training, Gait  training, Patient/Family education, Self Care, Stair training, Vestibular training, Canalith repositioning, Visual/preceptual remediation/compensation, Cryotherapy, Moist heat, and Re-evaluation  PLAN FOR NEXT SESSION: , balance HEP  10:23 AM, 03/09/23 M. Shary Decamp, PT, DPT Physical Therapist- [place] Office Number: [phone]

## 2023-03-10 ENCOUNTER — Encounter: Payer: Self-pay | Admitting: Orthopedic Surgery

## 2023-03-10 ENCOUNTER — Ambulatory Visit: Payer: Medicare Other | Admitting: Orthopedic Surgery

## 2023-03-10 VITALS — Ht 64.0 in | Wt 212.0 lb

## 2023-03-10 DIAGNOSIS — M25511 Pain in right shoulder: Secondary | ICD-10-CM | POA: Diagnosis not present

## 2023-03-10 MED ORDER — METHYLPREDNISOLONE ACETATE 40 MG/ML IJ SUSP
40.0000 mg | Freq: Once | INTRAMUSCULAR | Status: AC
  Administered 2023-03-10: 40 mg via INTRA_ARTICULAR

## 2023-03-10 NOTE — Addendum Note (Signed)
Addended by: Michaele Offer on: 03/10/2023 04:37 PM   Modules accepted: Orders

## 2023-03-10 NOTE — Patient Instructions (Signed)

## 2023-03-10 NOTE — Progress Notes (Signed)
New Patient Visit  Assessment: Manuel Kline is a 89 y.o. male with the following: 1. Acute pain of right shoulder   Plan: RYKEN PASCHAL recently, and is complaining of right shoulder pain.  She has difficulty with overhead motion.  shoulder hurts all the time.  Reviewed radiographs, which are without acute injury.  He has some arthritis.  Discussed proceeding with an injection, and he elected proceed.  This was completed in clinic today without issues.  He will follow-up as needed.  Procedure note injection - Right shoulder    Verbal consent was obtained to inject the right shoulder, subacromial space Timeout was completed to confirm the site of injection.   The skin was prepped with alcohol and ethyl chloride was sprayed at the injection site.  A 21-gauge needle was used to inject 40 mg of Depo-Medrol and 1% lidocaine (3 cc) into the subacromial space of the right shoulder using a posterolateral approach.  There were no complications.  A sterile bandage was applied.    Follow-up: Return if symptoms worsen or fail to improve.  Subjective:  Chief Complaint  Patient presents with   Shoulder Pain    Bilateral shoulder pain.    History of Present Illness: Manuel Kline is a 89 y.o. male who has been referred by Arville Care, MD for evaluation of right shoulder pain.  He states he fell couple weeks ago.  Since then, has had pain in the right shoulder, as well as the right elbow.  Recent radiographs are without acute injury.  No prior injury in his right shoulder.  He has had an injection several years ago.  He has difficulty with overhead motion.  Shoulder hurts all the time.   Review of Systems: No fevers or chills No numbness or tingling No chest pain No shortness of breath No bowel or bladder dysfunction No GI distress No headaches   Medical History:  Past Medical History:  Diagnosis Date   Adenomatous colon polyp 2006   CAD (coronary artery disease)    Calcium oxalate  renal stones    Cardiomyopathy    Cataract    Cataract    Cerebral embolism with cerebral infarction 12/26/2018   Chronic systolic heart failure (HCC)         CKD (chronic kidney disease) stage 4, GFR 15-29 ml/min (HCC) 11/08/2017   Claudication in peripheral vascular disease (HCC) 07/23/2020   Dehydration 07/2022   Diabetes (HCC)    Diabetic peripheral neuropathy (HCC) 01/31/2015   Dual implantable cardioverter-defibrillator in situ    01/16/2002 Dr. Ladona Ridgel  RA lead  Guidant [id number] RV lead  Guidant [id number] Generator  Guidant Prism  09/02/2009 Generator change Medtronic [id number]  SN  [id number]      Erectile dysfunction    Erosive esophagitis    Essential hypertension 11/30/2015   Hemorrhoids    History of TIA (transient ischemic attack) 01/19/2019   HLD (hyperlipidemia)    HTN (hypertension)    Hyperlipidemia    Hypertensive heart disease without CHF 07/31/2011   ICD (implantable cardiac defibrillator) in place    ICD dual chamber in situ    Ischemic cardiomyopathy    EF 40% June 2013    Left-sided weakness 12/25/2018   Metabolic syndrome    Moderate nonproliferative diabetic retinopathy of both eyes without macular edema associated with type 2 diabetes mellitus (HCC) 02/20/2020   Morbid obesity (HCC)    OSA on CPAP 11/05/2014   Osteoarthritis    Posterior vitreous  detachment of both eyes 02/20/2020   Presence of automatic (implantable) cardiac defibrillator 11/08/2017   S/P CABG (coronary artery bypass graft) 11/02/2000   Sleep apnea    Stroke-like symptoms 12/26/2018   Syncope 08/06/2009   Qualifier: Diagnosis of  By: Susette Racer CMA, Jewel     Type 2 diabetes, uncontrolled, with neuropathy    Has retinopahty and neuropathy     Ventricular tachycardia (paroxysmal) (HCC) 07/14/2016    Past Surgical History:  Procedure Laterality Date   ABDOMINAL EXPLORATION SURGERY     BACK SURGERY     X'3   cardiac bypass     CARDIAC DEFIBRILLATOR PLACEMENT     CARPAL TUNNEL  RELEASE     X2, bilateral   CATARACT EXTRACTION     COLONOSCOPY  06/20/2012   Procedure: COLONOSCOPY;  Surgeon: Mardella Layman, MD;  Location: [facility];  Service: Endoscopy;  Laterality: N/A;   DOPPLER ECHOCARDIOGRAPHY  2003   EP IMPLANTABLE DEVICE N/A 07/14/2016   Procedure: ICD Generator Changeout;  Surgeon: Marinus Maw, MD;  Location[facility] INVASIVE CV LAB;  Service: Cardiovascular;  Laterality: N/A;   ESOPHAGOGASTRODUODENOSCOPY  06/20/2012   Procedure: ESOPHAGOGASTRODUODENOSCOPY (EGD);  Surgeon: Mardella Layman, MD;  Location: [facility];  Service: Endoscopy;  Laterality: N/A;   EYE SURGERY     LAPAROTOMY     RETINOPATHY SURGERY Bilateral    rotator cuff surgery     left    Family History  Problem Relation Age of Onset   Diabetes Brother    Heart disease Father    Heart disease Mother    Diabetes Mother    Diabetes Brother    Diabetes Brother    Cancer Brother        baldder   Heart disease Brother    Diabetes Sister    Diabetes Sister    Kidney disease Sister        dialysis   Diabetes Sister    Diabetes Sister    Diabetes Sister    Throat cancer Paternal Uncle    Social History   Tobacco Use   Smoking status: Never   Smokeless tobacco: Never  Vaping Use   Vaping Use: Never used  Substance Use Topics   Alcohol use: No    Alcohol/week: 0.0 standard drinks of alcohol   Drug use: No    No Known Allergies  Current Meds  Medication Sig   allopurinol (ZYLOPRIM) 300 MG tablet Take 1/2 (one-half) tablet by mouth once daily   benazepril (LOTENSIN) 40 MG tablet Take 1 tablet (40 mg total) by mouth daily.   clopidogrel (PLAVIX) 75 MG tablet Take 1 tablet (75 mg total) by mouth daily.   Cyanocobalamin (VITAMIN B 12 PO) Take 1,000 mcg by mouth daily.   dapagliflozin propanediol (FARXIGA) 10 MG TABS tablet Take 1 tablet (10 mg total) by mouth daily before breakfast.   Dulaglutide (TRULICITY) 3 MG/0.5ML SOPN Inject 3 mg as directed once a week. [name]  patient assistance program   DULoxetine (CYMBALTA) 60 MG capsule Take 1 capsule (60 mg total) by mouth daily.   fluticasone (CUTIVATE) 0.05 % cream Apply 1 application. topically daily.   furosemide (LASIX) 40 MG tablet Take 1 tablet (40 mg total) by mouth 2 (two) times daily.   gabapentin (NEURONTIN) 300 MG capsule Take 1 capsule by mouth twice daily   Insulin Glargine (BASAGLAR KWIKPEN) 100 UNIT/ML Inject 20 Units into the skin 2 (two) times daily.   insulin glargine (LANTUS SOLOSTAR) 100  UNIT/ML Solostar Pen Inject 20 Units into the skin 2 (two) times daily.   Insulin Pen Needle (B-D UF III MINI PEN NEEDLES) 31G X 5 MM MISC 1 each by Does not apply route 4 (four) times daily.   insulin regular (HUMULIN R) 100 units/mL injection Inject 0.2 mLs (20 Units total) into the skin 3 (three) times daily before meals.   Insulin Syringe-Needle U-100 (RELION INSULIN SYRINGE 1ML/31G) 31G X 5/16" 1 ML MISC USE TO INJECT INSULIN TWICE DAILY AS DIRECTED. DX: E11.65 (Patient taking differently: 1 each by Other route See admin instructions. USE TO INJECT INSULIN TWICE DAILY AS DIRECTED. DX: E11.65)   metoprolol succinate (TOPROL-XL) 50 MG 24 hr tablet TAKE 1 TABLET BY MOUTH ONCE DAILY WITH OR IMMEDIATELY FOLLOWING A MEAL (Patient taking differently: Take 50 mg by mouth daily. TAKE 1 TABLET BY MOUTH ONCE DAILY WITH OR IMMEDIATELY FOLLOWING A MEAL)   Multiple Vitamin (MULTIVITAMIN ADULT PO) Take 1 tablet by mouth daily.   nitroGLYCERIN (NITROSTAT) 0.4 MG SL tablet Place 1 tablet (0.4 mg total) under the tongue every 5 (five) minutes as needed for chest pain.   Omega-3 Fatty Acids (FISH OIL) 1000 MG CPDR Take 1 tablet by mouth daily.   polyethylene glycol powder (GLYCOLAX/MIRALAX) powder Take 17 g by mouth 2 (two) times daily as needed. (Patient taking differently: Take 17 g by mouth 2 (two) times daily as needed for mild constipation or moderate constipation.)   rosuvastatin (CRESTOR) 5 MG tablet Take 1 tablet (5 mg  total) by mouth daily.   vitamin C (ASCORBIC ACID) 500 MG tablet Take 500 mg by mouth daily.    Objective: Ht 5\' 4"  (1.626 m)   Wt 212 lb (96.2 kg)   BMI 36.39 kg/m   Physical Exam:  General: Elderly male., Alert and oriented., and No acute distress. Gait: Ambulates with the assistance of a walker.  Shoulder no deformity.  No bruising.  No swelling.  Forward flexion limited to 110 degrees.  Pain in empty can testing position.  Fingers are warm well-perfused.  IMAGING: I personally reviewed images previously obtained in clinic  X-ray of the right shoulder was previously obtained.  Glenohumeral joint is reduced.  Some small inferior osteophytes.  Limited views overall.   New Medications:  No orders of the defined types were placed in this encounter.     Oliver Barre, MD  03/10/2023 2:20 PM

## 2023-03-11 ENCOUNTER — Ambulatory Visit (INDEPENDENT_AMBULATORY_CARE_PROVIDER_SITE_OTHER): Payer: Medicare Other | Admitting: Pharmacist

## 2023-03-11 ENCOUNTER — Ambulatory Visit: Payer: Medicare Other

## 2023-03-11 DIAGNOSIS — Z794 Long term (current) use of insulin: Secondary | ICD-10-CM

## 2023-03-11 DIAGNOSIS — M6281 Muscle weakness (generalized): Secondary | ICD-10-CM | POA: Diagnosis not present

## 2023-03-11 DIAGNOSIS — Z9181 History of falling: Secondary | ICD-10-CM

## 2023-03-11 DIAGNOSIS — R42 Dizziness and giddiness: Secondary | ICD-10-CM | POA: Diagnosis not present

## 2023-03-11 DIAGNOSIS — E1169 Type 2 diabetes mellitus with other specified complication: Secondary | ICD-10-CM

## 2023-03-11 MED ORDER — TRULICITY 3 MG/0.5ML ~~LOC~~ SOAJ
3.0000 mg | SUBCUTANEOUS | 11 refills | Status: DC
Start: 2023-03-11 — End: 2023-08-05

## 2023-03-11 MED ORDER — BASAGLAR KWIKPEN 100 UNIT/ML ~~LOC~~ SOPN
20.0000 [IU] | PEN_INJECTOR | Freq: Two times a day (BID) | SUBCUTANEOUS | 5 refills | Status: DC
Start: 2023-03-11 — End: 2023-08-05

## 2023-03-11 MED ORDER — INSULIN REGULAR HUMAN 100 UNIT/ML IJ SOLN
10.0000 [IU] | Freq: Three times a day (TID) | INTRAMUSCULAR | 3 refills | Status: DC
Start: 2023-03-11 — End: 2023-08-05

## 2023-03-11 NOTE — Therapy (Signed)
OUTPATIENT PHYSICAL THERAPY NEURO TREATMENT   Patient Name: Manuel Kline MRN: [id number] DOB:[birth year], 89 y.o., male Today's Date: 03/11/2023  REFERRING PROVIDER: Dettinger, Elige Radon, MD   END OF SESSION:  PT End of Session - 03/11/23 1450     Visit Number 11    Number of Visits 12    Date for PT Re-Evaluation 04/30/23    Authorization Type [employer] Medicare    PT Start Time 1445    PT Stop Time 1530    PT Time Calculation (min) 45 min    Equipment Utilized During Treatment Other (comment)   Rollator   Activity Tolerance Patient tolerated treatment well    Behavior During Therapy [facility] for tasks assessed/performed            Past Medical History:  Diagnosis Date   Adenomatous colon polyp 2006   CAD (coronary artery disease)    Calcium oxalate renal stones    Cardiomyopathy    Cataract    Cataract    Cerebral embolism with cerebral infarction 12/26/2018   Chronic systolic heart failure (HCC)         CKD (chronic kidney disease) stage 4, GFR 15-29 ml/min (HCC) 11/08/2017   Claudication in peripheral vascular disease (HCC) 07/23/2020   Dehydration 07/2022   Diabetes (HCC)    Diabetic peripheral neuropathy (HCC) 01/31/2015   Dual implantable cardioverter-defibrillator in situ    01/16/2002 Dr. Ladona Ridgel  RA lead  Guidant [id number] RV lead  Guidant [id number] Generator  Guidant Prism  09/02/2009 Generator change Medtronic [id number]  SN  [id number]      Erectile dysfunction    Erosive esophagitis    Essential hypertension 11/30/2015   Hemorrhoids    History of TIA (transient ischemic attack) 01/19/2019   HLD (hyperlipidemia)    HTN (hypertension)    Hyperlipidemia    Hypertensive heart disease without CHF 07/31/2011   ICD (implantable cardiac defibrillator) in place    ICD dual chamber in situ    Ischemic cardiomyopathy    EF 40% June 2013    Left-sided weakness 12/25/2018   Metabolic syndrome    Moderate nonproliferative diabetic retinopathy of both eyes without macular  edema associated with type 2 diabetes mellitus (HCC) 02/20/2020   Morbid obesity (HCC)    OSA on CPAP 11/05/2014   Osteoarthritis    Posterior vitreous detachment of both eyes 02/20/2020   Presence of automatic (implantable) cardiac defibrillator 11/08/2017   S/P CABG (coronary artery bypass graft) 11/02/2000   Sleep apnea    Stroke-like symptoms 12/26/2018   Syncope 08/06/2009   Qualifier: Diagnosis of  By: Susette Racer CMA, Jewel     Type 2 diabetes, uncontrolled, with neuropathy    Has retinopahty and neuropathy     Ventricular tachycardia (paroxysmal) (HCC) 07/14/2016   Past Surgical History:  Procedure Laterality Date   ABDOMINAL EXPLORATION SURGERY     BACK SURGERY     X'3   cardiac bypass     CARDIAC DEFIBRILLATOR PLACEMENT     CARPAL TUNNEL RELEASE     X2, bilateral   CATARACT EXTRACTION     COLONOSCOPY  06/20/2012   Procedure: COLONOSCOPY;  Surgeon: Mardella Layman, MD;  Location: [facility];  Service: Endoscopy;  Laterality: N/A;   DOPPLER ECHOCARDIOGRAPHY  2003   EP IMPLANTABLE DEVICE N/A 07/14/2016   Procedure: ICD Generator Changeout;  Surgeon: Marinus Maw, MD;  Location: [facility] INVASIVE CV LAB;  Service: Cardiovascular;  Laterality: N/A;   ESOPHAGOGASTRODUODENOSCOPY  06/20/2012  Procedure: ESOPHAGOGASTRODUODENOSCOPY (EGD);  Surgeon: Mardella Layman, MD;  Location: [facility];  Service: Endoscopy;  Laterality: N/A;   EYE SURGERY     LAPAROTOMY     RETINOPATHY SURGERY Bilateral    rotator cuff surgery     left   Patient Active Problem List   Diagnosis Date Noted   Fall 03/01/2023   Porokeratosis 11/04/2022   Pneumatosis of intestines 07/21/2022   Diarrhea 07/21/2022   N&V (nausea and vomiting) 07/21/2022   Hav (hallux abducto valgus), left 05/06/2022   Callus 10/29/2021   Glaucoma suspect with open angle 04/02/2021   Pseudophakia of both eyes 08/20/2020   Claudication in peripheral vascular disease (HCC) 07/23/2020   Sleep apnea    Osteoarthritis     Metabolic syndrome    Erosive esophagitis    Erectile dysfunction    Calcium oxalate renal stones    Moderate nonproliferative diabetic retinopathy of both eyes without macular edema associated with type 2 diabetes mellitus (HCC) 02/20/2020   Posterior vitreous detachment of both eyes 02/20/2020   History of TIA (transient ischemic attack) 01/19/2019   Cerebral embolism with cerebral infarction 12/26/2018   CKD (chronic kidney disease) stage 4, GFR 15-29 ml/min (HCC) 11/08/2017   ICD (implantable cardioverter-defibrillator) in place 11/08/2017   Ventricular tachycardia (paroxysmal) (HCC) 07/14/2016   Essential hypertension 11/30/2015   Diabetic peripheral neuropathy (HCC) 01/31/2015   OSA on CPAP 11/05/2014   CAD (coronary artery disease)    Dual implantable cardioverter-defibrillator in situ    Hyperlipidemia    Type 2 diabetes mellitus with other specified complication (HCC)    Morbid obesity (HCC)    Ischemic cardiomyopathy    Chronic systolic heart failure (HCC)    S/P CABG (coronary artery bypass graft) 11/02/2000   ONSET DATE: 2-3 years ago  REFERRING DIAG: Recurrent falls   THERAPY DIAG:  History of falling  Muscle weakness (generalized)  Dizziness and giddiness  Rationale for Evaluation and Treatment: Rehabilitation  SUBJECTIVE:                                                                                                                                                                                             SUBJECTIVE STATEMENT: Right shoulder injection, feeling better.  Dizziness hasn't been too bad today   Pt accompanied by: self  PERTINENT HISTORY: Chronic systolic heart failure, hypertension, diabetes, diabetic peripheral neuropathy, osteoarthritis, chronic kidney disease, pacemaker, and history of a TIA  PAIN:  Are you having pain? Yes: NPRS scale: 5/10 Pain location: lright shoulder  at rest  PRECAUTIONS: Fall  WEIGHT BEARING RESTRICTIONS:  No  FALLS: Has patient fallen  in last 6 months? Yes. Number of falls 12+  LIVING ENVIRONMENT: Lives with: lives with their spouse Lives in: House/apartment Stairs: No Has following equipment at home: Grab bars and Hurry cane  PLOF: Independent  PATIENT GOALS: improved balance  OBJECTIVE:   TODAY'S TREATMENT: 03/11/23 Activity Comments  Right sidelying test Right, upbeating nystagmus  Right Epley   Right sidelying 1-2 beat nystagmus  Brandt-Daroff 1-2 beat in right sidelying, 2-4 beat left upbeating in left sideyling? -no nystagmus on 2nd repetition  Sidestepping x 2 min 5# UE support  Alt stair taps x 2 min 5# UE support, 6" box  LAQ 3x10 5#  Sit-stand 2x10 eyes closed 1x10 w/ ea UE for support  Standing balance -EO 2x30 sec -head turns feet together 3x -EC 4x10 sec  Gastroc stretch 2x60 sec On slantboard to increase DF ROM     TODAY'S TREATMENT: 03/09/23 Activity Comments  Brandt-Daroff   5xSTS 17 sec  TUG test 16 sec w/ rollator  Standing balance   Sidestepping x 2 min   Alt step taps x 60 sec 5#  Lateral step over 20x 5#  LAQ 3x10         PATIENT EDUCATION: Education details: vestibular therapy, safety, and progress with therapy Person educated: Patient Education method: Explanation Education comprehension: verbalized understanding  HOME EXERCISE PROGRAM: Access Code: [email or web address] URL: [email or web address] Date: 03/03/2023 Prepared by: Shary Decamp  Exercises - Brandt-Daroff Vestibular Exercise  - 1 x daily - 7 x weekly - 3-5 reps -Sit to stand at sink 3x10 eyes closed w/ UE support  VESTIBULAR ASSESSMENT (02/23/23)   GENERAL OBSERVATION: reports advanced neuropathy    SYMPTOM BEHAVIOR:   Subjective history: ongoing balance issues x 2 years   Non-Vestibular symptoms: tinnitus and deficits with looking up   Type of dizziness: doesn't report any obvious dizziness   Frequency: looking up, turning head   Duration:    Aggravating factors:  Induced by position change:  sometimes with sit to stand  and Induced by motion: looking up at the ceiling, turning body quickly, and turning head quickly   Relieving factors: head stationary and slow movements   Progression of symptoms: unchanged   OCULOMOTOR EXAM:   Ocular Alignment: normal   Ocular ROM: No Limitations   Spontaneous Nystagmus: absent   Gaze-Induced Nystagmus: absent   Smooth Pursuits: intact   Saccades: intact and slower when looking right to midline   Convergence/Divergence: 3 cm         VESTIBULAR - OCULAR REFLEX:    Slow VOR: Normal   VOR Cancellation: Normal   Head-Impulse Test: HIT Right: negative HIT Left: negative   Dynamic Visual Acuity: Not able to be assessed      POSITIONAL TESTING: Right Dix-Hallpike: upbeating, right nystagmus Left Dix-Hallpike: no nystagmus Right Roll Test: no nystagmus Left Roll Test: no nystagmus    MOTION SENSITIVITY:    Motion Sensitivity Quotient  Intensity: 0 = none, 1 = Lightheaded, 2 = Mild, 3 = Moderate, 4 = Severe, 5 = Vomiting  Intensity  1. Sitting to supine   2. Supine to L side   3. Supine to R side   4. Supine to sitting   5. L Hallpike-Dix   6. Up from L    7. R Hallpike-Dix   8. Up from R    9. Sitting, head  tipped to L knee   10. Head up from L  knee   11. Sitting, head  tipped to R knee  12. Head up from R  knee   13. Sitting head turns x5   14.Sitting head nods x5   15. In stance, 180  turn to L    16. In stance, 180  turn to R     OTHOSTATICS: not done  FUNCTIONAL GAIT: Dynamic Gait Index: TBD M-CTSIB: TBD   VESTIBULAR TREATMENT:  Canalith Repositioning:   Epley Right: Number of Reps: 2 Gaze Adaptation:   TBD Habituation:   Other: TBD Other:   PATIENT EDUCATION: Education details: assessmetnt findings, tx rationale. Person educated: Patient and Child(ren) Education method: Explanation and Handouts Education comprehension: verbalized  understanding    COGNITION: Overall cognitive status: Within functional limits for tasks assessed   SENSATION: Light touch: Impaired  Diminished sensation in both lower extremities  EDEMA:  No edema observed  POSTURE: forward head and decreased lumbar lordosis  LOWER EXTREMITY MMT:    MMT Right Eval Left Eval  Hip flexion 4-/5 4-/5  Hip extension    Hip abduction    Hip adduction    Hip internal rotation    Hip external rotation    Knee flexion 4-/5 4/5  Knee extension 4-/5 4-/5  Ankle dorsiflexion 3/5 3/5  Ankle plantarflexion    Ankle inversion    Ankle eversion    (Blank rows = not tested)  TRANSFERS: Assistive device utilized: Environmental consultant - 2 wheeled  Sit to stand: SBA and requires use of the armrests Stand to sit: SBA and requires use of the armrests  GAIT: Gait pattern: step through pattern, decreased step length- Right, decreased step length- Left, decreased stride length, ataxic, decreased trunk rotation, wide BOS, poor foot clearance- Right, and poor foot clearance- Left Assistive device utilized: Quad cane small base Level of assistance: SBA Comments: utilized intermittent external support from walls and doors  FUNCTIONAL TESTS:  5 times sit to stand: 26.98 seconds with use of the armrests Timed up and go (TUG): 20.54 seconds with small based quad cane     GOALS: Goals reviewed with patient? Yes  SHORT TERM GOALS: Target date: 02/25/23  Patient will be independent with his initial HEP. Baseline: Goal status: MET  2.  Patient will improve his 5 times sit to stand time to 19 seconds or less for improved lower extremity power. Baseline: 26.25 seconds on 02/18/23; (02/25/23) 32.78; (03/09/23) 17 sec Goal status: MET  3.  Patient will improve his timed up and go time to 15 seconds or less for improved functional mobility. Baseline: 24.63 seconds with Rolator on 02/18/23; (02/25/23) 20 sec; (03/09/23) 16.8 sec w/ rollator Goal status: NOT  MET        LONG TERM GOALS: Target date: 03/18/23  Patient will be independent with his advanced HEP. Baseline:  Goal status: IN PROGRESS  2.  Patient will improve his 5 times sit to stand time to 15 seconds or less to reduce his risk of falling. Baseline:  Goal status: IN PROGRESS  3.  Patient will improve his timed up and go time to 12 seconds or less to reduce his fall risk. Baseline:  Goal status: IN PROGRESS  4.  Patient will be able to transfer from sitting to standing with minimal to no upper extremity support. Baseline:  Goal status: IN PROGRESS  5. Pt to remain free of positional dizziness to reduce risk for falls with mobility  Baseline: +R Dix-Hallpike  Goal status: INITIAL  6.  Demo low risk for falls per score 19/24 Dynamic Gait Index for improved safety with  ambulation  Baseline: 11/24  Goal status: INITIAL  ASSESSMENT:  CLINICAL IMPRESSION: Positional vertigo present with testing. Canalith repositioning followed by Brandt-Daroff for habituation with good response and then absence of nystagmus and report of dizziness. Proceeded with activities to improve LE strength and static balance to improve safety wih unsupported standing. Profound deficits with standing balance under eyes closed conditions w/ multi-directional LOB tolerating 10 sec intervals with CGA-min A to recover balance. Will perform re-assess/recertification at next session for continued sessions to meet POC details  OBJECTIVE IMPAIRMENTS: Abnormal gait, decreased activity tolerance, decreased balance, decreased mobility, difficulty walking, decreased strength, and pain.   ACTIVITY LIMITATIONS: carrying, lifting, standing, squatting, stairs, transfers, and locomotion level  PARTICIPATION LIMITATIONS: meal prep, cleaning, laundry, shopping, community activity, and yard work  PERSONAL FACTORS: Age, Fitness, Time since onset of injury/illness/exacerbation, and 3+ comorbidities: Chronic systolic heart  failure, hypertension, diabetes, diabetic peripheral neuropathy, osteoarthritis, chronic kidney disease, pacemaker, and history of a TIA  are also affecting patient's functional outcome.   REHAB POTENTIAL: Fair    CLINICAL DECISION MAKING: Unstable/unpredictable  EVALUATION COMPLEXITY: High  PLAN:  PT FREQUENCY: 2x/week  PT DURATION: 6 weeks  PLANNED INTERVENTIONS: Therapeutic exercises, Therapeutic activity, Neuromuscular re-education, Balance training, Gait training, Patient/Family education, Self Care, Stair training, Vestibular training, Canalith repositioning, Visual/preceptual remediation/compensation, Cryotherapy, Moist heat, and Re-evaluation  PLAN FOR NEXT SESSION: , balance HEP  2:50 PM, 03/11/23 M. Shary Decamp, PT, DPT Physical Therapist- [place] Office Number: [phone]

## 2023-03-11 NOTE — Progress Notes (Signed)
03/11/2023 Name: Manuel Kline MRN: [id number] DOB: 1931-10-21  Chief Complaint  Patient presents with   Diabetes    Manuel Kline is a 89 y.o. year old male who presented for a telephone visit.   They were referred to the pharmacist by their PCP for assistance in managing diabetes and medication access.    Subjective:  Care Team: Primary Care Provider: Dettinger, Elige Radon, MD  Medication Access/Adherence  Current Pharmacy:  [facility] [address], [place] - [address] [place] [address] Phone: [phone] Fax: [phone]  [facility] - [place], [place] - [address] [place] [address] Phone: [phone] Fax: [phone]  [facility] - [place], [place] - [address] [place] [address] Phone: [phone] Fax: [phone]   Patient reports affordability concerns with their medications: Yes  Patient reports access/transportation concerns to their pharmacy: No  Patient reports adherence concerns with their medications:  No  FAMILY INVOLVED IN CARE   Diabetes:  Current medications:  BASAGLAR, TRULICITY, FARXIGA, INSULIN  Medications tried in the past: METFORMIN, VICTOZA, GLIPIZIDE, lantus, tresiba, novolog  Current glucose readings: mostly FBG<130 however reports hypoglycemia later PM likely due to inconsistent eating patterns and overlap of insulin R  -Patient is testing blood sugar 6 times daily -Patient is injecting insulin 4 or more times daily -He would greatly benefit from a continuous glucose monitoring system (I.e. libre or dexcom)  Patient reports hypoglycemic s/sx including dizziness, shakiness, sweating. Patient denies hyperglycemic symptoms including polyuria, polydipsia, polyphagia, nocturia, neuropathy, blurred vision.  Current physical activity: AS ABLE  Current medication access support: SEE ACTIVE FYI TAB   Objective:  Lab Results   Component Value Date   HGBA1C 7.8 (H) 01/27/2023    Lab Results  Component Value Date   CREATININE 2.13 (H) 01/27/2023   BUN 33 (H) 01/27/2023   NA 138 01/27/2023   K 4.7 01/27/2023   CL 100 01/27/2023   CO2 22 01/27/2023    Lab Results  Component Value Date   CHOL 122 01/27/2023   HDL 47 01/27/2023   LDLCALC 52 01/27/2023   TRIG 131 01/27/2023   CHOLHDL 2.6 01/27/2023    Medications Reviewed Today     Reviewed by Danella Maiers, [facility] (Pharmacist) on 03/11/23 at 1046  Med List Status: <None>   Medication Order Taking? Sig Documenting Provider Last Dose Status Informant  allopurinol (ZYLOPRIM) 300 MG tablet [id number] No Take 1/2 (one-half) tablet by mouth once daily Dettinger, Elige Radon, MD Taking Active   benazepril (LOTENSIN) 40 MG tablet [id number] No Take 1 tablet (40 mg total) by mouth daily. Dettinger, Elige Radon, MD Taking Active   clopidogrel (PLAVIX) 75 MG tablet [id number] No Take 1 tablet (75 mg total) by mouth daily. Dettinger, Elige Radon, MD Taking Active   Cyanocobalamin (VITAMIN B 12 PO) [id number] No Take 1,000 mcg by mouth daily. [provider] Taking Active Spouse/Significant Other  dapagliflozin propanediol (FARXIGA) 10 MG TABS tablet [id number] No Take 1 tablet (10 mg total) by mouth daily before breakfast. Dettinger, Elige Radon, MD Taking Active   Dulaglutide (TRULICITY) 3 MG/0.5ML SOPN [id number] No Inject 3 mg as directed once a week. [name] patient assistance program Dettinger, Elige Radon, MD Taking Active   DULoxetine (CYMBALTA) 60 MG capsule [id number] No Take 1 capsule (60 mg total) by mouth daily. Dettinger, Elige Radon, MD  Taking Active   fluticasone (CUTIVATE) 0.05 % cream [id number] No Apply 1 application. topically daily. [provider] Taking Active   furosemide (LASIX) 40 MG tablet [id number] No Take 1 tablet (40 mg total) by mouth 2 (two) times daily. Dettinger, Elige Radon, MD Taking Active   gabapentin (NEURONTIN) 300 MG capsule [id number]  No Take 1 capsule by mouth twice daily Dettinger, Elige Radon, MD Taking Active   Insulin Glargine [facility] KWIKPEN) 100 UNIT/ML [id number] No Inject 20 Units into the skin 2 (two) times daily. Dettinger, Elige Radon, MD Taking Active   insulin glargine (LANTUS SOLOSTAR) 100 UNIT/ML Solostar Pen [id number] No Inject 20 Units into the skin 2 (two) times daily. Dettinger, Elige Radon, MD Taking Active   Insulin Pen Needle (B-D UF III MINI PEN NEEDLES) 31G X 5 MM MISC [id number] No 1 each by Does not apply route 4 (four) times daily. Dettinger, Elige Radon, MD Taking Active   insulin regular (HUMULIN R) 100 units/mL injection [id number] No Inject 0.2 mLs (20 Units total) into the skin 3 (three) times daily before meals. Dettinger, Elige Radon, MD Taking Active   Insulin Syringe-Needle U-100 (RELION INSULIN SYRINGE 1ML/31G) 31G X 5/16" 1 ML MISC [id number] No USE TO INJECT INSULIN TWICE DAILY AS DIRECTED. DX: E11.65  Patient taking differently: 1 each by Other route See admin instructions. USE TO INJECT INSULIN TWICE DAILY AS DIRECTED. DX: E11.65   Dettinger, Elige Radon, MD Taking Active   metoprolol succinate (TOPROL-XL) 50 MG 24 hr tablet [id number] No TAKE 1 TABLET BY MOUTH ONCE DAILY WITH OR IMMEDIATELY FOLLOWING A MEAL  Patient taking differently: Take 50 mg by mouth daily. TAKE 1 TABLET BY MOUTH ONCE DAILY WITH OR IMMEDIATELY FOLLOWING A MEAL   Dettinger, Elige Radon, MD Taking Active   Multiple Vitamin (MULTIVITAMIN ADULT PO) [id number] No Take 1 tablet by mouth daily. [provider] Taking Active   nitroGLYCERIN (NITROSTAT) 0.4 MG SL tablet [id number] No Place 1 tablet (0.4 mg total) under the tongue every 5 (five) minutes as needed for chest pain. Dettinger, Elige Radon, MD Taking Active   Omega-3 Fatty Acids (FISH OIL) 1000 MG CPDR [id number] No Take 1 tablet by mouth daily. [provider] Taking Active Spouse/Significant Other  polyethylene glycol powder (GLYCOLAX/MIRALAX) powder [id number] No Take 17 g by  mouth 2 (two) times daily as needed.  Patient taking differently: Take 17 g by mouth 2 (two) times daily as needed for mild constipation or moderate constipation.   Johna Sheriff, MD Taking Active   rosuvastatin (CRESTOR) 5 MG tablet [id number] No Take 1 tablet (5 mg total) by mouth daily. Dettinger, Elige Radon, MD Taking Active   vitamin C (ASCORBIC ACID) 500 MG tablet [id number] No Take 500 mg by mouth daily. [provider] Taking Active             Assessment/Plan:   Diabetes: - Currently uncontrolled--patient reports episodes of hypoglycemia likely due to overlap of Humulin R with meals  Continue Trulicity 3mg  weekly   Continue dapagliflozin (farxiga) Continue Basaglar 15 units basal twice daily--informed patient they can take 30 units once daily since this is a long acting insulin Continue Humulin R 15 units with breakfast / dinner--- recommended to decrease to 10-12 units since patient is having some hypoglycemia episodes (I would ultimately like to see if patient can transition to a rapid acting bolus insulin vs Humulin R--will explore as this may be more safe/avoid hyppoglycemia)  Trulicity, Basaglar & insulin R to be  shipped to patient's home via [employer] patient assistance program--patient/family can call [phone] - Reviewed long term cardiovascular and renal outcomes of uncontrolled blood sugar - Reviewed goal A1c, goal fasting, and goal 2 hour post prandial glucose - Reviewed dietary modifications including FOLLOWING A HEART HEALTHY DIET/HEALTHY PLATE METHOD - Reviewed lifestyle modifications including: as able - Recommend to check glucose daily (fasting), with meals & if symptomatic - Meets financial criteria for [employer] (trulicity, basaglar, insulin R) patient assistance.  Will collaborate with provider, CPhT, and patient to pursue assistance.   Follow Up Plan: as needed; PCP  Kieth Brightly, PharmD, BCACP Clinical Pharmacist, [facility]

## 2023-03-15 ENCOUNTER — Telehealth: Payer: Self-pay

## 2023-03-15 NOTE — Telephone Encounter (Signed)
Submitted application for TRULICITY 3MG /0.5ML, BASAGLAR KWIKPEN, HUMULIN R to [name] for patient assistance.   All meds escribed 03/11/23  Phone: [phone]

## 2023-03-18 ENCOUNTER — Ambulatory Visit: Payer: Medicare Other

## 2023-03-18 DIAGNOSIS — M6281 Muscle weakness (generalized): Secondary | ICD-10-CM

## 2023-03-18 DIAGNOSIS — R42 Dizziness and giddiness: Secondary | ICD-10-CM | POA: Diagnosis not present

## 2023-03-18 DIAGNOSIS — Z9181 History of falling: Secondary | ICD-10-CM

## 2023-03-18 NOTE — Telephone Encounter (Signed)
Received notification from [facility] [employer] regarding approval for TRULICITY 3MG /0.5ML. Patient assistance approved from 03/15/23 to 08/31/23.  Medication will ship to patients home  Refaxed pgs 8-9 for Basaglar and Humulin R. Meds sent electronically  Phone: [phone]

## 2023-03-18 NOTE — Therapy (Signed)
OUTPATIENT PHYSICAL THERAPY NEURO TREATMENT and Recertification   Patient Name: Manuel Kline MRN: [id number] DOB:1931-11-19, 89 y.o., male Today's Date: 03/18/2023  REFERRING PROVIDER: Dettinger, Elige Radon, MD   END OF SESSION:  PT End of Session - 03/18/23 1440     Visit Number 12    Number of Visits 18    Date for PT Re-Evaluation 04/15/23    Authorization Type [employer] Medicare    PT Start Time 1445    PT Stop Time 1530    PT Time Calculation (min) 45 min    Equipment Utilized During Treatment Other (comment)   Rollator   Activity Tolerance Patient tolerated treatment well    Behavior During Therapy [facility] for tasks assessed/performed            Past Medical History:  Diagnosis Date   Adenomatous colon polyp 2006   CAD (coronary artery disease)    Calcium oxalate renal stones    Cardiomyopathy    Cataract    Cataract    Cerebral embolism with cerebral infarction 12/26/2018   Chronic systolic heart failure (HCC)         CKD (chronic kidney disease) stage 4, GFR 15-29 ml/min (HCC) 11/08/2017   Claudication in peripheral vascular disease (HCC) 07/23/2020   Dehydration 07/2022   Diabetes (HCC)    Diabetic peripheral neuropathy (HCC) 01/31/2015   Dual implantable cardioverter-defibrillator in situ    01/16/2002 Dr. Ladona Ridgel  RA lead  Guidant [id number] RV lead  Guidant [id number] Generator  Guidant Prism  09/02/2009 Generator change Medtronic [id number]  SN  [id number] H      Erectile dysfunction    Erosive esophagitis    Essential hypertension 11/30/2015   Hemorrhoids    History of TIA (transient ischemic attack) 01/19/2019   HLD (hyperlipidemia)    HTN (hypertension)    Hyperlipidemia    Hypertensive heart disease without CHF 07/31/2011   ICD (implantable cardiac defibrillator) in place    ICD dual chamber in situ    Ischemic cardiomyopathy    EF 40% June 2013    Left-sided weakness 12/25/2018   Metabolic syndrome    Moderate nonproliferative diabetic retinopathy of both  eyes without macular edema associated with type 2 diabetes mellitus (HCC) 02/20/2020   Morbid obesity (HCC)    OSA on CPAP 11/05/2014   Osteoarthritis    Posterior vitreous detachment of both eyes 02/20/2020   Presence of automatic (implantable) cardiac defibrillator 11/08/2017   S/P CABG (coronary artery bypass graft) 11/02/2000   Sleep apnea    Stroke-like symptoms 12/26/2018   Syncope 08/06/2009   Qualifier: Diagnosis of  By: Susette Racer CMA, Jewel     Type 2 diabetes, uncontrolled, with neuropathy    Has retinopahty and neuropathy     Ventricular tachycardia (paroxysmal) (HCC) 07/14/2016   Past Surgical History:  Procedure Laterality Date   ABDOMINAL EXPLORATION SURGERY     BACK SURGERY     X'3   cardiac bypass     CARDIAC DEFIBRILLATOR PLACEMENT     CARPAL TUNNEL RELEASE     X2, bilateral   CATARACT EXTRACTION     COLONOSCOPY  06/20/2012   Procedure: COLONOSCOPY;  Surgeon: Mardella Layman, MD;  Location: [facility] ENDOSCOPY;  Service: Endoscopy;  Laterality: N/A;   DOPPLER ECHOCARDIOGRAPHY  2003   EP IMPLANTABLE DEVICE N/A 07/14/2016   Procedure: ICD Generator Changeout;  Surgeon: Marinus Maw, MD;  Location: [facility] INVASIVE CV LAB;  Service: Cardiovascular;  Laterality: N/A;   ESOPHAGOGASTRODUODENOSCOPY  06/20/2012   Procedure: ESOPHAGOGASTRODUODENOSCOPY (EGD);  Surgeon: Mardella Layman, MD;  Location: [facility];  Service: Endoscopy;  Laterality: N/A;   EYE SURGERY     LAPAROTOMY     RETINOPATHY SURGERY Bilateral    rotator cuff surgery     left   Patient Active Problem List   Diagnosis Date Noted   Fall 03/01/2023   Porokeratosis 11/04/2022   Pneumatosis of intestines 07/21/2022   Diarrhea 07/21/2022   N&V (nausea and vomiting) 07/21/2022   Hav (hallux abducto valgus), left 05/06/2022   Callus 10/29/2021   Glaucoma suspect with open angle 04/02/2021   Pseudophakia of both eyes 08/20/2020   Claudication in peripheral vascular disease (HCC) 07/23/2020   Sleep apnea     Osteoarthritis    Metabolic syndrome    Erosive esophagitis    Erectile dysfunction    Calcium oxalate renal stones    Moderate nonproliferative diabetic retinopathy of both eyes without macular edema associated with type 2 diabetes mellitus (HCC) 02/20/2020   Posterior vitreous detachment of both eyes 02/20/2020   History of TIA (transient ischemic attack) 01/19/2019   Cerebral embolism with cerebral infarction 12/26/2018   CKD (chronic kidney disease) stage 4, GFR 15-29 ml/min (HCC) 11/08/2017   ICD (implantable cardioverter-defibrillator) in place 11/08/2017   Ventricular tachycardia (paroxysmal) (HCC) 07/14/2016   Essential hypertension 11/30/2015   Diabetic peripheral neuropathy (HCC) 01/31/2015   OSA on CPAP 11/05/2014   CAD (coronary artery disease)    Dual implantable cardioverter-defibrillator in situ    Hyperlipidemia    Type 2 diabetes mellitus with other specified complication (HCC)    Morbid obesity (HCC)    Ischemic cardiomyopathy    Chronic systolic heart failure (HCC)    S/P CABG (coronary artery bypass graft) 11/02/2000   ONSET DATE: 2-3 years ago  REFERRING DIAG: Recurrent falls   THERAPY DIAG:  History of falling  Muscle weakness (generalized)  Dizziness and giddiness  Rationale for Evaluation and Treatment: Rehabilitation  SUBJECTIVE:                                                                                                                                                                                             SUBJECTIVE STATEMENT: Shoulder is overall feeling better.  No additional falls, last bad dizzy episode was the previous fall   Pt accompanied by: self  PERTINENT HISTORY: Chronic systolic heart failure, hypertension, diabetes, diabetic peripheral neuropathy, osteoarthritis, chronic kidney disease, pacemaker, and history of a TIA  PAIN:  Are you having pain? Yes: NPRS scale: 5/10 Pain location: lright shoulder  at rest  PRECAUTIONS:  Fall  WEIGHT  BEARING RESTRICTIONS: No  FALLS: Has patient fallen in last 6 months? Yes. Number of falls 12+  LIVING ENVIRONMENT: Lives with: lives with their spouse Lives in: House/apartment Stairs: No Has following equipment at home: Grab bars and Hurry cane  PLOF: Independent  PATIENT GOALS: improved balance  OBJECTIVE:   TODAY'S TREATMENT: 03/18/23 Activity Comments  Sidelying test R/L x 2 reps No nystagmus, no symptoms  STG/LTG See below  Corner balance See HEP              TODAY'S TREATMENT: 03/11/23 Activity Comments  Right sidelying test Right, upbeating nystagmus  Right Epley   Right sidelying 1-2 beat nystagmus  Brandt-Daroff 1-2 beat in right sidelying, 2-4 beat left upbeating in left sideyling? -no nystagmus on 2nd repetition  Sidestepping x 2 min 5# UE support  Alt stair taps x 2 min 5# UE support, 6" box  LAQ 3x10 5#  Sit-stand 2x10 eyes closed 1x10 w/ ea UE for support  Standing balance -EO 2x30 sec -head turns feet together 3x -EC 4x10 sec  Gastroc stretch 2x60 sec On slantboard to increase DF ROM        PATIENT EDUCATION: Education details: vestibular therapy, safety, and progress with therapy Person educated: Patient Education method: Explanation Education comprehension: verbalized understanding  HOME EXERCISE PROGRAM: Access Code: [email or web address] URL: [email or web address] Date: 03/03/2023 Prepared by: Shary Decamp  Exercises - Brandt-Daroff Vestibular Exercise  - 1 x daily - 7 x weekly - 3-5 reps -Sit to stand at sink 3x10 eyes closed w/ UE support - Standing Balance in Corner  - 1 x daily - 7 x weekly - 1-3 sets - 30 sec hold - Standing Balance in Corner with Eyes Closed  - 1 x daily - 7 x weekly - 1-3 sets - 30 sec hold - Corner Balance Feet Apart: Eyes Open With Head Turns  - 1 x daily - 7 x weekly - 3 sets - 3 reps  VESTIBULAR ASSESSMENT (02/23/23)   GENERAL OBSERVATION: reports advanced neuropathy    SYMPTOM  BEHAVIOR:   Subjective history: ongoing balance issues x 2 years   Non-Vestibular symptoms: tinnitus and deficits with looking up   Type of dizziness: doesn't report any obvious dizziness   Frequency: looking up, turning head   Duration:    Aggravating factors: Induced by position change:  sometimes with sit to stand  and Induced by motion: looking up at the ceiling, turning body quickly, and turning head quickly   Relieving factors: head stationary and slow movements   Progression of symptoms: unchanged   OCULOMOTOR EXAM:   Ocular Alignment: normal   Ocular ROM: No Limitations   Spontaneous Nystagmus: absent   Gaze-Induced Nystagmus: absent   Smooth Pursuits: intact   Saccades: intact and slower when looking right to midline   Convergence/Divergence: 3 cm         VESTIBULAR - OCULAR REFLEX:    Slow VOR: Normal   VOR Cancellation: Normal   Head-Impulse Test: HIT Right: negative HIT Left: negative   Dynamic Visual Acuity: Not able to be assessed      POSITIONAL TESTING: Right Dix-Hallpike: upbeating, right nystagmus Left Dix-Hallpike: no nystagmus Right Roll Test: no nystagmus Left Roll Test: no nystagmus    MOTION SENSITIVITY:    Motion Sensitivity Quotient  Intensity: 0 = none, 1 = Lightheaded, 2 = Mild, 3 = Moderate, 4 = Severe, 5 = Vomiting  Intensity  1. Sitting to supine   2. Supine to L side  3. Supine to R side   4. Supine to sitting   5. L Hallpike-Dix   6. Up from L    7. R Hallpike-Dix   8. Up from R    9. Sitting, head  tipped to L knee   10. Head up from L  knee   11. Sitting, head  tipped to R knee   12. Head up from R  knee   13. Sitting head turns x5   14.Sitting head nods x5   15. In stance, 180  turn to L    16. In stance, 180  turn to R     OTHOSTATICS: not done  FUNCTIONAL GAIT: Dynamic Gait Index: TBD M-CTSIB: TBD   VESTIBULAR TREATMENT:  Canalith Repositioning:   Epley Right: Number of Reps: 2 Gaze  Adaptation:   TBD Habituation:   Other: TBD Other:   PATIENT EDUCATION: Education details: assessmetnt findings, tx rationale. Person educated: Patient and Child(ren) Education method: Explanation and Handouts Education comprehension: verbalized understanding    COGNITION: Overall cognitive status: Within functional limits for tasks assessed   SENSATION: Light touch: Impaired  Diminished sensation in both lower extremities  EDEMA:  No edema observed  POSTURE: forward head and decreased lumbar lordosis  LOWER EXTREMITY MMT:    MMT Right Eval Left Eval  Hip flexion 4-/5 4-/5  Hip extension    Hip abduction    Hip adduction    Hip internal rotation    Hip external rotation    Knee flexion 4-/5 4/5  Knee extension 4-/5 4-/5  Ankle dorsiflexion 3/5 3/5  Ankle plantarflexion    Ankle inversion    Ankle eversion    (Blank rows = not tested)  TRANSFERS: Assistive device utilized: Environmental consultant - 2 wheeled  Sit to stand: SBA and requires use of the armrests Stand to sit: SBA and requires use of the armrests  GAIT: Gait pattern: step through pattern, decreased step length- Right, decreased step length- Left, decreased stride length, ataxic, decreased trunk rotation, wide BOS, poor foot clearance- Right, and poor foot clearance- Left Assistive device utilized: Quad cane small base Level of assistance: SBA Comments: utilized intermittent external support from walls and doors  FUNCTIONAL TESTS:  5 times sit to stand: 26.98 seconds with use of the armrests Timed up and go (TUG): 20.54 seconds with small based quad cane     GOALS: Goals reviewed with patient? Yes  SHORT TERM GOALS: Target date: 02/25/23  Patient will be independent with his initial HEP. Baseline: Goal status: MET  2.  Patient will improve his 5 times sit to stand time to 19 seconds or less for improved lower extremity power. Baseline: 26.25 seconds on 02/18/23; (02/25/23) 32.78; (03/09/23) 17 sec Goal  status: MET  3.  Patient will improve his timed up and go time to 15 seconds or less for improved functional mobility. Baseline: 24.63 seconds with Rolator on 02/18/23; (02/25/23) 20 sec; (03/09/23) 16.8 sec w/ rollator Goal status: NOT MET        LONG TERM GOALS: Target date: 04/15/2023    Patient will be independent with his advanced HEP. Baseline:  Goal status: IN PROGRESS  2.  Patient will improve his 5 times sit to stand time to 15 seconds or less to reduce his risk of falling. Baseline: 15 sec Goal status: MET  3.  Patient will improve his timed up and go time to 12 seconds or less to reduce his fall risk. Baseline: (03/18/23) 16.8 sec w/ rollator  Goal status: IN PROGRESS  4.  Patient will be able to transfer from sitting to standing with minimal to no upper extremity support. Baseline:  Goal status: MET  5. Pt to remain free of positional dizziness to reduce risk for falls with mobility  Baseline: +R Dix-Hallpike; (03/18/23) -sidelying test R/L  Goal status: MET  6.  Demo low risk for falls per score 19/24 Dynamic Gait Index for improved safety with ambulation  Baseline: 11/24; (03/18/23) 14/24  Goal status: IN PROGRESS  ASSESSMENT:  CLINICAL IMPRESSION: Returns today and notes dizziness is mostly improved and his balance and stability have improved.  Demonstrates improved score on Dynamic Gait Index from initial 11/24 to 14/24 still indicating high risk for falls.  Positional tests reveal no dizziness or nystagmus today. Able to meet 5xSTS test measure indicating improved balance and BLE strength.  Initiated additional balance/habituation activities for HEP on this date.  Recommend continued tx at this time to continue monitoring positional dizziness and provide additional HEP activities for balance and habituation as indicated.   OBJECTIVE IMPAIRMENTS: Abnormal gait, decreased activity tolerance, decreased balance, decreased mobility, difficulty walking, decreased  strength, and pain.   ACTIVITY LIMITATIONS: carrying, lifting, standing, squatting, stairs, transfers, and locomotion level  PARTICIPATION LIMITATIONS: meal prep, cleaning, laundry, shopping, community activity, and yard work  PERSONAL FACTORS: Age, Fitness, Time since onset of injury/illness/exacerbation, and 3+ comorbidities: Chronic systolic heart failure, hypertension, diabetes, diabetic peripheral neuropathy, osteoarthritis, chronic kidney disease, pacemaker, and history of a TIA  are also affecting patient's functional outcome.   REHAB POTENTIAL: Fair    CLINICAL DECISION MAKING: Unstable/unpredictable  EVALUATION COMPLEXITY: High  PLAN:  PT FREQUENCY: 1-2x/week  PT DURATION: 4 weeks  PLANNED INTERVENTIONS: Therapeutic exercises, Therapeutic activity, Neuromuscular re-education, Balance training, Gait training, Patient/Family education, Self Care, Stair training, Vestibular training, Canalith repositioning, Visual/preceptual remediation/compensation, Cryotherapy, Moist heat, and Re-evaluation  PLAN FOR NEXT SESSION: , balance HEP review, positional tests/tx as indicated  3:27 PM, 03/18/23 M. Shary Decamp, PT, DPT Physical Therapist-  Office Number: [phone]

## 2023-03-22 ENCOUNTER — Ambulatory Visit: Payer: Medicare Other

## 2023-03-22 DIAGNOSIS — R42 Dizziness and giddiness: Secondary | ICD-10-CM

## 2023-03-22 DIAGNOSIS — Z9181 History of falling: Secondary | ICD-10-CM | POA: Diagnosis not present

## 2023-03-22 DIAGNOSIS — M6281 Muscle weakness (generalized): Secondary | ICD-10-CM | POA: Diagnosis not present

## 2023-03-22 NOTE — Therapy (Signed)
OUTPATIENT PHYSICAL THERAPY NEURO TREATMENT    Patient Name: Manuel Kline MRN: [id number] DOB:[birth year], 89 y.o., male Today's Date: 03/22/2023  REFERRING PROVIDER: Dettinger, Elige Radon, MD   END OF SESSION:  PT End of Session - 03/22/23 1019     Visit Number 13    Number of Visits 18    Date for PT Re-Evaluation 04/15/23    Authorization Type [employer] Medicare    PT Start Time 1015    PT Stop Time 1100    PT Time Calculation (min) 45 min    Equipment Utilized During Treatment Other (comment)   Rollator   Activity Tolerance Patient tolerated treatment well    Behavior During Therapy [facility] for tasks assessed/performed            Past Medical History:  Diagnosis Date   Adenomatous colon polyp 2006   CAD (coronary artery disease)    Calcium oxalate renal stones    Cardiomyopathy    Cataract    Cataract    Cerebral embolism with cerebral infarction 12/26/2018   Chronic systolic heart failure (HCC)         CKD (chronic kidney disease) stage 4, GFR 15-29 ml/min (HCC) 11/08/2017   Claudication in peripheral vascular disease (HCC) 07/23/2020   Dehydration 07/2022   Diabetes (HCC)    Diabetic peripheral neuropathy (HCC) 01/31/2015   Dual implantable cardioverter-defibrillator in situ    01/16/2002 Dr. Ladona Ridgel  RA lead  Guidant [id number] RV lead  Guidant [id number] Generator  Guidant Prism  09/02/2009 Generator change Medtronic [id number]  SN  [id number]      Erectile dysfunction    Erosive esophagitis    Essential hypertension 11/30/2015   Hemorrhoids    History of TIA (transient ischemic attack) 01/19/2019   HLD (hyperlipidemia)    HTN (hypertension)    Hyperlipidemia    Hypertensive heart disease without CHF 07/31/2011   ICD (implantable cardiac defibrillator) in place    ICD dual chamber in situ    Ischemic cardiomyopathy    EF 40% June 2013    Left-sided weakness 12/25/2018   Metabolic syndrome    Moderate nonproliferative diabetic retinopathy of both eyes without macular  edema associated with type 2 diabetes mellitus (HCC) 02/20/2020   Morbid obesity (HCC)    OSA on CPAP 11/05/2014   Osteoarthritis    Posterior vitreous detachment of both eyes 02/20/2020   Presence of automatic (implantable) cardiac defibrillator 11/08/2017   S/P CABG (coronary artery bypass graft) 11/02/2000   Sleep apnea    Stroke-like symptoms 12/26/2018   Syncope 08/06/2009   Qualifier: Diagnosis of  By: Susette Racer CMA, Jewel     Type 2 diabetes, uncontrolled, with neuropathy    Has retinopahty and neuropathy     Ventricular tachycardia (paroxysmal) (HCC) 07/14/2016   Past Surgical History:  Procedure Laterality Date   ABDOMINAL EXPLORATION SURGERY     BACK SURGERY     X'3   cardiac bypass     CARDIAC DEFIBRILLATOR PLACEMENT     CARPAL TUNNEL RELEASE     X2, bilateral   CATARACT EXTRACTION     COLONOSCOPY  06/20/2012   Procedure: COLONOSCOPY;  Surgeon: Mardella Layman, MD;  Location: [facility] ENDOSCOPY;  Service: Endoscopy;  Laterality: N/A;   DOPPLER ECHOCARDIOGRAPHY  2003   EP IMPLANTABLE DEVICE N/A 07/14/2016   Procedure: ICD Generator Changeout;  Surgeon: Marinus Maw, MD;  Location: [facility] INVASIVE CV LAB;  Service: Cardiovascular;  Laterality: N/A;   ESOPHAGOGASTRODUODENOSCOPY  06/20/2012  Procedure: ESOPHAGOGASTRODUODENOSCOPY (EGD);  Surgeon: Mardella Layman, MD;  Location: [facility];  Service: Endoscopy;  Laterality: N/A;   EYE SURGERY     LAPAROTOMY     RETINOPATHY SURGERY Bilateral    rotator cuff surgery     left   Patient Active Problem List   Diagnosis Date Noted   Fall 03/01/2023   Porokeratosis 11/04/2022   Pneumatosis of intestines 07/21/2022   Diarrhea 07/21/2022   N&V (nausea and vomiting) 07/21/2022   Hav (hallux abducto valgus), left 05/06/2022   Callus 10/29/2021   Glaucoma suspect with open angle 04/02/2021   Pseudophakia of both eyes 08/20/2020   Claudication in peripheral vascular disease (HCC) 07/23/2020   Sleep apnea    Osteoarthritis     Metabolic syndrome    Erosive esophagitis    Erectile dysfunction    Calcium oxalate renal stones    Moderate nonproliferative diabetic retinopathy of both eyes without macular edema associated with type 2 diabetes mellitus (HCC) 02/20/2020   Posterior vitreous detachment of both eyes 02/20/2020   History of TIA (transient ischemic attack) 01/19/2019   Cerebral embolism with cerebral infarction 12/26/2018   CKD (chronic kidney disease) stage 4, GFR 15-29 ml/min (HCC) 11/08/2017   ICD (implantable cardioverter-defibrillator) in place 11/08/2017   Ventricular tachycardia (paroxysmal) (HCC) 07/14/2016   Essential hypertension 11/30/2015   Diabetic peripheral neuropathy (HCC) 01/31/2015   OSA on CPAP 11/05/2014   CAD (coronary artery disease)    Dual implantable cardioverter-defibrillator in situ    Hyperlipidemia    Type 2 diabetes mellitus with other specified complication (HCC)    Morbid obesity (HCC)    Ischemic cardiomyopathy    Chronic systolic heart failure (HCC)    S/P CABG (coronary artery bypass graft) 11/02/2000   ONSET DATE: 2-3 years ago  REFERRING DIAG: Recurrent falls   THERAPY DIAG:  History of falling  Muscle weakness (generalized)  Dizziness and giddiness  Rationale for Evaluation and Treatment: Rehabilitation  SUBJECTIVE:                                                                                                                                                                                             SUBJECTIVE STATEMENT: Did have a fall the other day, got feet tangled up. Not dizzy.  No injury from this though   Pt accompanied by: self  PERTINENT HISTORY: Chronic systolic heart failure, hypertension, diabetes, diabetic peripheral neuropathy, osteoarthritis, chronic kidney disease, pacemaker, and history of a TIA  PAIN:  Are you having pain? Yes: NPRS scale: 5/10 Pain location: lright shoulder  at rest  PRECAUTIONS: Fall  WEIGHT BEARING  RESTRICTIONS: No  FALLS: Has patient fallen in last 6 months? Yes. Number of falls 12+  LIVING ENVIRONMENT: Lives with: lives with their spouse Lives in: House/apartment Stairs: No Has following equipment at home: Grab bars and Hurry cane  PLOF: Independent  PATIENT GOALS: improved balance  OBJECTIVE:   TODAY'S TREATMENT: 03/22/23 Activity Comments  AFO trial for right foot To correct for foot drop  Standing balance with right AFO -EO/EC x 10 sec -head turns 3x  Brandt-Daroff 3 reps, downbeating/rotatry? Improved/absent by 3rd rep  Dynamic balance at/along counter -sidestepping x 60 sec -foot on step 3x15 sec -backward walk along counter x 6 laps           TODAY'S TREATMENT: 03/18/23 Activity Comments  Sidelying test R/L x 2 reps No nystagmus, no symptoms  STG/LTG See below  Corner balance See HEP                PATIENT EDUCATION: Education details: vestibular therapy, safety, and progress with therapy Person educated: Patient Education method: Explanation Education comprehension: verbalized understanding  HOME EXERCISE PROGRAM: Access Code: [email or web address] URL: [email or web address] Date: 03/03/2023 Prepared by: Shary Decamp  Exercises - Brandt-Daroff Vestibular Exercise  - 1 x daily - 7 x weekly - 3-5 reps -Sit to stand at sink 3x10 eyes closed w/ UE support - Standing Balance in Corner  - 1 x daily - 7 x weekly - 1-3 sets - 30 sec hold - Standing Balance in Corner with Eyes Closed  - 1 x daily - 7 x weekly - 1-3 sets - 30 sec hold - Corner Balance Feet Apart: Eyes Open With Head Turns  - 1 x daily - 7 x weekly - 3 sets - 3 reps  VESTIBULAR ASSESSMENT (02/23/23)   GENERAL OBSERVATION: reports advanced neuropathy    SYMPTOM BEHAVIOR:   Subjective history: ongoing balance issues x 2 years   Non-Vestibular symptoms: tinnitus and deficits with looking up   Type of dizziness: doesn't report any obvious dizziness   Frequency: looking up, turning  head   Duration:    Aggravating factors: Induced by position change:  sometimes with sit to stand  and Induced by motion: looking up at the ceiling, turning body quickly, and turning head quickly   Relieving factors: head stationary and slow movements   Progression of symptoms: unchanged   OCULOMOTOR EXAM:   Ocular Alignment: normal   Ocular ROM: No Limitations   Spontaneous Nystagmus: absent   Gaze-Induced Nystagmus: absent   Smooth Pursuits: intact   Saccades: intact and slower when looking right to midline   Convergence/Divergence: 3 cm         VESTIBULAR - OCULAR REFLEX:    Slow VOR: Normal   VOR Cancellation: Normal   Head-Impulse Test: HIT Right: negative HIT Left: negative   Dynamic Visual Acuity: Not able to be assessed      POSITIONAL TESTING: Right Dix-Hallpike: upbeating, right nystagmus Left Dix-Hallpike: no nystagmus Right Roll Test: no nystagmus Left Roll Test: no nystagmus    MOTION SENSITIVITY:    Motion Sensitivity Quotient  Intensity: 0 = none, 1 = Lightheaded, 2 = Mild, 3 = Moderate, 4 = Severe, 5 = Vomiting  Intensity  1. Sitting to supine   2. Supine to L side   3. Supine to R side   4. Supine to sitting   5. L Hallpike-Dix   6. Up from L    7. R Hallpike-Dix   8. Up from R    9.  Sitting, head  tipped to L knee   10. Head up from L  knee   11. Sitting, head  tipped to R knee   12. Head up from R  knee   13. Sitting head turns x5   14.Sitting head nods x5   15. In stance, 180  turn to L    16. In stance, 180  turn to R     OTHOSTATICS: not done  FUNCTIONAL GAIT: Dynamic Gait Index: TBD M-CTSIB: TBD   VESTIBULAR TREATMENT:  Canalith Repositioning:   Epley Right: Number of Reps: 2 Gaze Adaptation:   TBD Habituation:   Other: TBD Other:   PATIENT EDUCATION: Education details: assessmetnt findings, tx rationale. Person educated: Patient and Child(ren) Education method: Explanation and Handouts Education  comprehension: verbalized understanding    COGNITION: Overall cognitive status: Within functional limits for tasks assessed   SENSATION: Light touch: Impaired  Diminished sensation in both lower extremities  EDEMA:  No edema observed  POSTURE: forward head and decreased lumbar lordosis  LOWER EXTREMITY MMT:    MMT Right Eval Left Eval  Hip flexion 4-/5 4-/5  Hip extension    Hip abduction    Hip adduction    Hip internal rotation    Hip external rotation    Knee flexion 4-/5 4/5  Knee extension 4-/5 4-/5  Ankle dorsiflexion 3/5 3/5  Ankle plantarflexion    Ankle inversion    Ankle eversion    (Blank rows = not tested)  TRANSFERS: Assistive device utilized: Environmental consultant - 2 wheeled  Sit to stand: SBA and requires use of the armrests Stand to sit: SBA and requires use of the armrests  GAIT: Gait pattern: step through pattern, decreased step length- Right, decreased step length- Left, decreased stride length, ataxic, decreased trunk rotation, wide BOS, poor foot clearance- Right, and poor foot clearance- Left Assistive device utilized: Quad cane small base Level of assistance: SBA Comments: utilized intermittent external support from walls and doors  FUNCTIONAL TESTS:  5 times sit to stand: 26.98 seconds with use of the armrests Timed up and go (TUG): 20.54 seconds with small based quad cane     GOALS: Goals reviewed with patient? Yes  SHORT TERM GOALS: Target date: 02/25/23  Patient will be independent with his initial HEP. Baseline: Goal status: MET  2.  Patient will improve his 5 times sit to stand time to 19 seconds or less for improved lower extremity power. Baseline: 26.25 seconds on 02/18/23; (02/25/23) 32.78; (03/09/23) 17 sec Goal status: MET  3.  Patient will improve his timed up and go time to 15 seconds or less for improved functional mobility. Baseline: 24.63 seconds with Rolator on 02/18/23; (02/25/23) 20 sec; (03/09/23) 16.8 sec w/ rollator Goal  status: NOT MET        LONG TERM GOALS: Target date: 04/15/2023    Patient will be independent with his advanced HEP. Baseline:  Goal status: IN PROGRESS  2.  Patient will improve his 5 times sit to stand time to 15 seconds or less to reduce his risk of falling. Baseline: 15 sec Goal status: MET  3.  Patient will improve his timed up and go time to 12 seconds or less to reduce his fall risk. Baseline: (03/18/23) 16.8 sec w/ rollator Goal status: IN PROGRESS  4.  Patient will be able to transfer from sitting to standing with minimal to no upper extremity support. Baseline:  Goal status: MET  5. Pt to remain free of positional dizziness  to reduce risk for falls with mobility  Baseline: +R Dix-Hallpike; (03/18/23) -sidelying test R/L  Goal status: MET  6.  Demo low risk for falls per score 19/24 Dynamic Gait Index for improved safety with ambulation  Baseline: 11/24; (03/18/23) 14/24  Goal status: IN PROGRESS  ASSESSMENT:  CLINICAL IMPRESSION: Demo use of AFO to address bilateral foot drop/dorsiflexion weakness R ankle > L ankle demo 3-/5 bilat dorsiflexion strength.  Improved foot clearance and standing stability with right AFO vs without. Sidelying reveals nystagmus (possibly downbeating today) which resolved after subsequent reps.  Continued with standing balance activities to improve stability and reduce risk for falls.  Patient would benefit from use of AFO or other similar intervention to improve safety with transfers and gait. Continued sessions indicated to progress vestibular rehab and reduce risk for falls  OBJECTIVE IMPAIRMENTS: Abnormal gait, decreased activity tolerance, decreased balance, decreased mobility, difficulty walking, decreased strength, and pain.   ACTIVITY LIMITATIONS: carrying, lifting, standing, squatting, stairs, transfers, and locomotion level  PARTICIPATION LIMITATIONS: meal prep, cleaning, laundry, shopping, community activity, and yard  work  PERSONAL FACTORS: Age, Fitness, Time since onset of injury/illness/exacerbation, and 3+ comorbidities: Chronic systolic heart failure, hypertension, diabetes, diabetic peripheral neuropathy, osteoarthritis, chronic kidney disease, pacemaker, and history of a TIA  are also affecting patient's functional outcome.   REHAB POTENTIAL: Fair    CLINICAL DECISION MAKING: Unstable/unpredictable  EVALUATION COMPLEXITY: High  PLAN:  PT FREQUENCY: 1-2x/week  PT DURATION: 4 weeks  PLANNED INTERVENTIONS: Therapeutic exercises, Therapeutic activity, Neuromuscular re-education, Balance training, Gait training, Patient/Family education, Self Care, Stair training, Vestibular training, Canalith repositioning, Visual/preceptual remediation/compensation, Cryotherapy, Moist heat, and Re-evaluation  PLAN FOR NEXT SESSION: , balance HEP review, positional tests/tx as indicated  10:20 AM, 03/22/23 M. Shary Decamp, PT, DPT Physical Therapist- [place] Office Number: [phone]

## 2023-03-23 DIAGNOSIS — E113393 Type 2 diabetes mellitus with moderate nonproliferative diabetic retinopathy without macular edema, bilateral: Secondary | ICD-10-CM | POA: Diagnosis not present

## 2023-03-23 DIAGNOSIS — H524 Presbyopia: Secondary | ICD-10-CM | POA: Diagnosis not present

## 2023-03-23 DIAGNOSIS — E1137X2 Type 2 diabetes mellitus with diabetic macular edema, resolved following treatment, left eye: Secondary | ICD-10-CM | POA: Diagnosis not present

## 2023-03-23 DIAGNOSIS — H40013 Open angle with borderline findings, low risk, bilateral: Secondary | ICD-10-CM | POA: Diagnosis not present

## 2023-03-23 DIAGNOSIS — H04123 Dry eye syndrome of bilateral lacrimal glands: Secondary | ICD-10-CM | POA: Diagnosis not present

## 2023-03-23 LAB — HM DIABETES EYE EXAM

## 2023-03-30 ENCOUNTER — Ambulatory Visit: Payer: Medicare Other

## 2023-03-30 ENCOUNTER — Other Ambulatory Visit: Payer: Self-pay | Admitting: Family Medicine

## 2023-03-30 DIAGNOSIS — R42 Dizziness and giddiness: Secondary | ICD-10-CM | POA: Diagnosis not present

## 2023-03-30 DIAGNOSIS — M6281 Muscle weakness (generalized): Secondary | ICD-10-CM | POA: Diagnosis not present

## 2023-03-30 DIAGNOSIS — Z9181 History of falling: Secondary | ICD-10-CM

## 2023-03-30 NOTE — Therapy (Signed)
OUTPATIENT PHYSICAL THERAPY NEURO TREATMENT    Patient Name: Manuel Kline MRN: [id number] DOB:1931/11/19, 89 y.o., male Today's Date: 03/30/2023  REFERRING PROVIDER: Dettinger, Elige Radon, MD   END OF SESSION:  PT End of Session - 03/30/23 1537     Visit Number 14    Number of Visits 18    Date for PT Re-Evaluation 04/15/23    Authorization Type [employer] Medicare    PT Start Time 1530    PT Stop Time 1615    PT Time Calculation (min) 45 min    Equipment Utilized During Treatment Other (comment)   Rollator   Activity Tolerance Patient tolerated treatment well    Behavior During Therapy [facility] for tasks assessed/performed            Past Medical History:  Diagnosis Date   Adenomatous colon polyp 2006   CAD (coronary artery disease)    Calcium oxalate renal stones    Cardiomyopathy    Cataract    Cataract    Cerebral embolism with cerebral infarction 12/26/2018   Chronic systolic heart failure (HCC)         CKD (chronic kidney disease) stage 4, GFR 15-29 ml/min (HCC) 11/08/2017   Claudication in peripheral vascular disease (HCC) 07/23/2020   Dehydration 07/2022   Diabetes (HCC)    Diabetic peripheral neuropathy (HCC) 01/31/2015   Dual implantable cardioverter-defibrillator in situ    01/16/2002 Dr. Ladona Ridgel  RA lead  Guidant [id number] RV lead  Guidant [id number] Generator  Guidant Prism  09/02/2009 Generator change Medtronic [id number]  SN  [id number]      Erectile dysfunction    Erosive esophagitis    Essential hypertension 11/30/2015   Hemorrhoids    History of TIA (transient ischemic attack) 01/19/2019   HLD (hyperlipidemia)    HTN (hypertension)    Hyperlipidemia    Hypertensive heart disease without CHF 07/31/2011   ICD (implantable cardiac defibrillator) in place    ICD dual chamber in situ    Ischemic cardiomyopathy    EF 40% June 2013    Left-sided weakness 12/25/2018   Metabolic syndrome    Moderate nonproliferative diabetic retinopathy of both eyes without macular  edema associated with type 2 diabetes mellitus (HCC) 02/20/2020   Morbid obesity (HCC)    OSA on CPAP 11/05/2014   Osteoarthritis    Posterior vitreous detachment of both eyes 02/20/2020   Presence of automatic (implantable) cardiac defibrillator 11/08/2017   S/P CABG (coronary artery bypass graft) 11/02/2000   Sleep apnea    Stroke-like symptoms 12/26/2018   Syncope 08/06/2009   Qualifier: Diagnosis of  By: Susette Racer CMA, Jewel     Type 2 diabetes, uncontrolled, with neuropathy    Has retinopahty and neuropathy     Ventricular tachycardia (paroxysmal) (HCC) 07/14/2016   Past Surgical History:  Procedure Laterality Date   ABDOMINAL EXPLORATION SURGERY     BACK SURGERY     X'3   cardiac bypass     CARDIAC DEFIBRILLATOR PLACEMENT     CARPAL TUNNEL RELEASE     X2, bilateral   CATARACT EXTRACTION     COLONOSCOPY  06/20/2012   Procedure: COLONOSCOPY;  Surgeon: Mardella Layman, MD;  Location: [facility];  Service: Endoscopy;  Laterality: N/A;   DOPPLER ECHOCARDIOGRAPHY  2003   EP IMPLANTABLE DEVICE N/A 07/14/2016   Procedure: ICD Generator Changeout;  Surgeon: Marinus Maw, MD;  Location: [facility] INVASIVE CV LAB;  Service: Cardiovascular;  Laterality: N/A;   ESOPHAGOGASTRODUODENOSCOPY  06/20/2012  Procedure: ESOPHAGOGASTRODUODENOSCOPY (EGD);  Surgeon: Mardella Layman, MD;  Location: [facility];  Service: Endoscopy;  Laterality: N/A;   EYE SURGERY     LAPAROTOMY     RETINOPATHY SURGERY Bilateral    rotator cuff surgery     left   Patient Active Problem List   Diagnosis Date Noted   Fall 03/01/2023   Porokeratosis 11/04/2022   Pneumatosis of intestines 07/21/2022   Diarrhea 07/21/2022   N&V (nausea and vomiting) 07/21/2022   Hav (hallux abducto valgus), left 05/06/2022   Callus 10/29/2021   Glaucoma suspect with open angle 04/02/2021   Pseudophakia of both eyes 08/20/2020   Claudication in peripheral vascular disease (HCC) 07/23/2020   Sleep apnea    Osteoarthritis     Metabolic syndrome    Erosive esophagitis    Erectile dysfunction    Calcium oxalate renal stones    Moderate nonproliferative diabetic retinopathy of both eyes without macular edema associated with type 2 diabetes mellitus (HCC) 02/20/2020   Posterior vitreous detachment of both eyes 02/20/2020   History of TIA (transient ischemic attack) 01/19/2019   Cerebral embolism with cerebral infarction 12/26/2018   CKD (chronic kidney disease) stage 4, GFR 15-29 ml/min (HCC) 11/08/2017   ICD (implantable cardioverter-defibrillator) in place 11/08/2017   Ventricular tachycardia (paroxysmal) (HCC) 07/14/2016   Essential hypertension 11/30/2015   Diabetic peripheral neuropathy (HCC) 01/31/2015   OSA on CPAP 11/05/2014   CAD (coronary artery disease)    Dual implantable cardioverter-defibrillator in situ    Hyperlipidemia    Type 2 diabetes mellitus with other specified complication (HCC)    Morbid obesity (HCC)    Ischemic cardiomyopathy    Chronic systolic heart failure (HCC)    S/P CABG (coronary artery bypass graft) 11/02/2000   ONSET DATE: 2-3 years ago  REFERRING DIAG: Recurrent falls   THERAPY DIAG:  History of falling  Muscle weakness (generalized)  Dizziness and giddiness  Rationale for Evaluation and Treatment: Rehabilitation  SUBJECTIVE:                                                                                                                                                                                             SUBJECTIVE STATEMENT: Left shoulder is increasingly sore, no known mechanism.  Also, feeling "swimmyheaded"   Pt accompanied by: self  PERTINENT HISTORY: Chronic systolic heart failure, hypertension, diabetes, diabetic peripheral neuropathy, osteoarthritis, chronic kidney disease, pacemaker, and history of a TIA  PAIN:  Are you having pain? Yes: NPRS scale: 5/10 Pain location: lright shoulder  at rest  PRECAUTIONS: Fall  WEIGHT BEARING RESTRICTIONS:  No  FALLS: Has patient fallen  in last 6 months? Yes. Number of falls 12+  LIVING ENVIRONMENT: Lives with: lives with their spouse Lives in: House/apartment Stairs: No Has following equipment at home: Grab bars and Hurry cane  PLOF: Independent  PATIENT GOALS: improved balance  OBJECTIVE:   TODAY'S TREATMENT: 03/30/23 Activity Comments  Vitals: 101/61 mmHg, 61 bpm   Brandt-Daroff -right upbeating nystagmus in right sideyling and with return to sitting, approx 4-6 beats  Deep Head Hang Appears to be downbeating nystagmus  Modified Yacovino   Vitals: 118/69 mmHg, 60 bpm         TODAY'S TREATMENT: 03/22/23 Activity Comments  AFO trial for right foot To correct for foot drop  Standing balance with right AFO -EO/EC x 10 sec -head turns 3x  Brandt-Daroff 3 reps, downbeating/rotatry? Improved/absent by 3rd rep  Dynamic balance at/along counter -sidestepping x 60 sec -foot on step 3x15 sec -backward walk along counter x 6 laps             PATIENT EDUCATION: Education details: vestibular therapy, safety, and progress with therapy Person educated: Patient Education method: Explanation Education comprehension: verbalized understanding  HOME EXERCISE PROGRAM: Access Code: [email or web address] URL: [email or web address] Date: 03/03/2023 Prepared by: Shary Decamp  Exercises - Brandt-Daroff Vestibular Exercise  - 1 x daily - 7 x weekly - 3-5 reps -Sit to stand at sink 3x10 eyes closed w/ UE support - Standing Balance in Corner  - 1 x daily - 7 x weekly - 1-3 sets - 30 sec hold - Standing Balance in Corner with Eyes Closed  - 1 x daily - 7 x weekly - 1-3 sets - 30 sec hold - Corner Balance Feet Apart: Eyes Open With Head Turns  - 1 x daily - 7 x weekly - 3 sets - 3 reps  VESTIBULAR ASSESSMENT (02/23/23)   GENERAL OBSERVATION: reports advanced neuropathy    SYMPTOM BEHAVIOR:   Subjective history: ongoing balance issues x 2 years   Non-Vestibular symptoms: tinnitus and  deficits with looking up   Type of dizziness: doesn't report any obvious dizziness   Frequency: looking up, turning head   Duration:    Aggravating factors: Induced by position change:  sometimes with sit to stand  and Induced by motion: looking up at the ceiling, turning body quickly, and turning head quickly   Relieving factors: head stationary and slow movements   Progression of symptoms: unchanged   OCULOMOTOR EXAM:   Ocular Alignment: normal   Ocular ROM: No Limitations   Spontaneous Nystagmus: absent   Gaze-Induced Nystagmus: absent   Smooth Pursuits: intact   Saccades: intact and slower when looking right to midline   Convergence/Divergence: 3 cm         VESTIBULAR - OCULAR REFLEX:    Slow VOR: Normal   VOR Cancellation: Normal   Head-Impulse Test: HIT Right: negative HIT Left: negative   Dynamic Visual Acuity: Not able to be assessed      POSITIONAL TESTING: Right Dix-Hallpike: upbeating, right nystagmus Left Dix-Hallpike: no nystagmus Right Roll Test: no nystagmus Left Roll Test: no nystagmus    MOTION SENSITIVITY:    Motion Sensitivity Quotient  Intensity: 0 = none, 1 = Lightheaded, 2 = Mild, 3 = Moderate, 4 = Severe, 5 = Vomiting  Intensity  1. Sitting to supine   2. Supine to L side   3. Supine to R side   4. Supine to sitting   5. L Hallpike-Dix   6. Up from L    7. R Hallpike-Dix  8. Up from R    9. Sitting, head  tipped to L knee   10. Head up from L  knee   11. Sitting, head  tipped to R knee   12. Head up from R  knee   13. Sitting head turns x5   14.Sitting head nods x5   15. In stance, 180  turn to L    16. In stance, 180  turn to R     OTHOSTATICS: not done  FUNCTIONAL GAIT: Dynamic Gait Index: TBD M-CTSIB: TBD   VESTIBULAR TREATMENT:  Canalith Repositioning:   Epley Right: Number of Reps: 2 Gaze Adaptation:   TBD Habituation:   Other: TBD Other:   PATIENT EDUCATION: Education details: assessmetnt findings, tx  rationale. Person educated: Patient and Child(ren) Education method: Explanation and Handouts Education comprehension: verbalized understanding    COGNITION: Overall cognitive status: Within functional limits for tasks assessed   SENSATION: Light touch: Impaired  Diminished sensation in both lower extremities  EDEMA:  No edema observed  POSTURE: forward head and decreased lumbar lordosis  LOWER EXTREMITY MMT:    MMT Right Eval Left Eval  Hip flexion 4-/5 4-/5  Hip extension    Hip abduction    Hip adduction    Hip internal rotation    Hip external rotation    Knee flexion 4-/5 4/5  Knee extension 4-/5 4-/5  Ankle dorsiflexion 3/5 3/5  Ankle plantarflexion    Ankle inversion    Ankle eversion    (Blank rows = not tested)  TRANSFERS: Assistive device utilized: Environmental consultant - 2 wheeled  Sit to stand: SBA and requires use of the armrests Stand to sit: SBA and requires use of the armrests  GAIT: Gait pattern: step through pattern, decreased step length- Right, decreased step length- Left, decreased stride length, ataxic, decreased trunk rotation, wide BOS, poor foot clearance- Right, and poor foot clearance- Left Assistive device utilized: Quad cane small base Level of assistance: SBA Comments: utilized intermittent external support from walls and doors  FUNCTIONAL TESTS:  5 times sit to stand: 26.98 seconds with use of the armrests Timed up and go (TUG): 20.54 seconds with small based quad cane     GOALS: Goals reviewed with patient? Yes  SHORT TERM GOALS: Target date: 02/25/23  Patient will be independent with his initial HEP. Baseline: Goal status: MET  2.  Patient will improve his 5 times sit to stand time to 19 seconds or less for improved lower extremity power. Baseline: 26.25 seconds on 02/18/23; (02/25/23) 32.78; (03/09/23) 17 sec Goal status: MET  3.  Patient will improve his timed up and go time to 15 seconds or less for improved functional  mobility. Baseline: 24.63 seconds with Rolator on 02/18/23; (02/25/23) 20 sec; (03/09/23) 16.8 sec w/ rollator Goal status: NOT MET        LONG TERM GOALS: Target date: 04/15/2023    Patient will be independent with his advanced HEP. Baseline:  Goal status: IN PROGRESS  2.  Patient will improve his 5 times sit to stand time to 15 seconds or less to reduce his risk of falling. Baseline: 15 sec Goal status: MET  3.  Patient will improve his timed up and go time to 12 seconds or less to reduce his fall risk. Baseline: (03/18/23) 16.8 sec w/ rollator Goal status: IN PROGRESS  4.  Patient will be able to transfer from sitting to standing with minimal to no upper extremity support. Baseline:  Goal status: MET  5. Pt to remain free of positional dizziness to reduce risk for falls with mobility  Baseline: +R Dix-Hallpike; (03/18/23) -sidelying test R/L  Goal status: MET  6.  Demo low risk for falls per score 19/24 Dynamic Gait Index for improved safety with ambulation  Baseline: 11/24; (03/18/23) 14/24  Goal status: IN PROGRESS  ASSESSMENT:  CLINICAL IMPRESSION: Reports ongoing symptoms and demo right upbeating nystagmus in right sideyling test position with an element of vertical nystagmus when returning to upright sitting.  Placed to deep head position with what appears to be downbeating nystagmus possibly indicating now multi-canal involvement. Use of modified Yacavino for repositioning. Discussed POC details and activity modification for the time being. Continued sessions to address dizziness and balance deficits  OBJECTIVE IMPAIRMENTS: Abnormal gait, decreased activity tolerance, decreased balance, decreased mobility, difficulty walking, decreased strength, and pain.   ACTIVITY LIMITATIONS: carrying, lifting, standing, squatting, stairs, transfers, and locomotion level  PARTICIPATION LIMITATIONS: meal prep, cleaning, laundry, shopping, community activity, and yard work  PERSONAL  FACTORS: Age, Fitness, Time since onset of injury/illness/exacerbation, and 3+ comorbidities: Chronic systolic heart failure, hypertension, diabetes, diabetic peripheral neuropathy, osteoarthritis, chronic kidney disease, pacemaker, and history of a TIA  are also affecting patient's functional outcome.   REHAB POTENTIAL: Fair    CLINICAL DECISION MAKING: Unstable/unpredictable  EVALUATION COMPLEXITY: High  PLAN:  PT FREQUENCY: 1-2x/week  PT DURATION: 4 weeks  PLANNED INTERVENTIONS: Therapeutic exercises, Therapeutic activity, Neuromuscular re-education, Balance training, Gait training, Patient/Family education, Self Care, Stair training, Vestibular training, Canalith repositioning, Visual/preceptual remediation/compensation, Cryotherapy, Moist heat, and Re-evaluation  PLAN FOR NEXT SESSION: , balance HEP review, positional tests/tx as indicated  3:37 PM, 03/30/23 M. Shary Decamp, PT, DPT Physical Therapist- [place] Office Number: [phone]

## 2023-04-01 NOTE — Telephone Encounter (Signed)
Received notification from [employer] regarding approval for HUMULIN R & BASAGLAR. Patient assistance approved from 03/29/23 to 08/31/23.  Medication will ship to patients home  Phone: [phone]

## 2023-04-05 ENCOUNTER — Ambulatory Visit: Payer: Medicare Other | Attending: Family Medicine

## 2023-04-05 DIAGNOSIS — M6281 Muscle weakness (generalized): Secondary | ICD-10-CM | POA: Insufficient documentation

## 2023-04-05 DIAGNOSIS — Z9181 History of falling: Secondary | ICD-10-CM | POA: Insufficient documentation

## 2023-04-05 DIAGNOSIS — R42 Dizziness and giddiness: Secondary | ICD-10-CM | POA: Insufficient documentation

## 2023-04-05 NOTE — Therapy (Signed)
OUTPATIENT PHYSICAL THERAPY NEURO TREATMENT    Patient Name: Manuel Kline MRN: [id number] DOB:[birth year], 89 y.o., male Today's Date: 04/05/2023  REFERRING PROVIDER: Dettinger, Elige Radon, MD   END OF SESSION:  PT End of Session - 04/05/23 0931     Visit Number 15    Number of Visits 18    Date for PT Re-Evaluation 04/15/23    Authorization Type [employer] Medicare    PT Start Time 0930    PT Stop Time 1015    PT Time Calculation (min) 45 min    Equipment Utilized During Treatment Other (comment)   Rollator   Activity Tolerance Patient tolerated treatment well    Behavior During Therapy [facility] for tasks assessed/performed            Past Medical History:  Diagnosis Date   Adenomatous colon polyp 2006   CAD (coronary artery disease)    Calcium oxalate renal stones    Cardiomyopathy    Cataract    Cataract    Cerebral embolism with cerebral infarction 12/26/2018   Chronic systolic heart failure (HCC)         CKD (chronic kidney disease) stage 4, GFR 15-29 ml/min (HCC) 11/08/2017   Claudication in peripheral vascular disease (HCC) 07/23/2020   Dehydration 07/2022   Diabetes (HCC)    Diabetic peripheral neuropathy (HCC) 01/31/2015   Dual implantable cardioverter-defibrillator in situ    01/16/2002 Dr. Ladona Ridgel  RA lead  Guidant [id number] RV lead  Guidant [id number] Generator  Guidant Prism  09/02/2009 Generator change Medtronic [id number]  SN  [id number] H      Erectile dysfunction    Erosive esophagitis    Essential hypertension 11/30/2015   Hemorrhoids    History of TIA (transient ischemic attack) 01/19/2019   HLD (hyperlipidemia)    HTN (hypertension)    Hyperlipidemia    Hypertensive heart disease without CHF 07/31/2011   ICD (implantable cardiac defibrillator) in place    ICD dual chamber in situ    Ischemic cardiomyopathy    EF 40% June 2013    Left-sided weakness 12/25/2018   Metabolic syndrome    Moderate nonproliferative diabetic retinopathy of both eyes without macular  edema associated with type 2 diabetes mellitus (HCC) 02/20/2020   Morbid obesity (HCC)    OSA on CPAP 11/05/2014   Osteoarthritis    Posterior vitreous detachment of both eyes 02/20/2020   Presence of automatic (implantable) cardiac defibrillator 11/08/2017   S/P CABG (coronary artery bypass graft) 11/02/2000   Sleep apnea    Stroke-like symptoms 12/26/2018   Syncope 08/06/2009   Qualifier: Diagnosis of  By: Susette Racer CMA, Jewel     Type 2 diabetes, uncontrolled, with neuropathy    Has retinopahty and neuropathy     Ventricular tachycardia (paroxysmal) (HCC) 07/14/2016   Past Surgical History:  Procedure Laterality Date   ABDOMINAL EXPLORATION SURGERY     BACK SURGERY     X'3   cardiac bypass     CARDIAC DEFIBRILLATOR PLACEMENT     CARPAL TUNNEL RELEASE     X2, bilateral   CATARACT EXTRACTION     COLONOSCOPY  06/20/2012   Procedure: COLONOSCOPY;  Surgeon: Mardella Layman, MD;  Location: [facility];  Service: Endoscopy;  Laterality: N/A;   DOPPLER ECHOCARDIOGRAPHY  2003   EP IMPLANTABLE DEVICE N/A 07/14/2016   Procedure: ICD Generator Changeout;  Surgeon: Marinus Maw, MD;  Location: [facility] INVASIVE CV LAB;  Service: Cardiovascular;  Laterality: N/A;   ESOPHAGOGASTRODUODENOSCOPY  06/20/2012  Procedure: ESOPHAGOGASTRODUODENOSCOPY (EGD);  Surgeon: Mardella Layman, MD;  Location: [facility];  Service: Endoscopy;  Laterality: N/A;   EYE SURGERY     LAPAROTOMY     RETINOPATHY SURGERY Bilateral    rotator cuff surgery     left   Patient Active Problem List   Diagnosis Date Noted   Fall 03/01/2023   Porokeratosis 11/04/2022   Pneumatosis of intestines 07/21/2022   Diarrhea 07/21/2022   N&V (nausea and vomiting) 07/21/2022   Hav (hallux abducto valgus), left 05/06/2022   Callus 10/29/2021   Glaucoma suspect with open angle 04/02/2021   Pseudophakia of both eyes 08/20/2020   Claudication in peripheral vascular disease (HCC) 07/23/2020   Sleep apnea    Osteoarthritis     Metabolic syndrome    Erosive esophagitis    Erectile dysfunction    Calcium oxalate renal stones    Moderate nonproliferative diabetic retinopathy of both eyes without macular edema associated with type 2 diabetes mellitus (HCC) 02/20/2020   Posterior vitreous detachment of both eyes 02/20/2020   History of TIA (transient ischemic attack) 01/19/2019   Cerebral embolism with cerebral infarction 12/26/2018   CKD (chronic kidney disease) stage 4, GFR 15-29 ml/min (HCC) 11/08/2017   ICD (implantable cardioverter-defibrillator) in place 11/08/2017   Ventricular tachycardia (paroxysmal) (HCC) 07/14/2016   Essential hypertension 11/30/2015   Diabetic peripheral neuropathy (HCC) 01/31/2015   OSA on CPAP 11/05/2014   CAD (coronary artery disease)    Dual implantable cardioverter-defibrillator in situ    Hyperlipidemia    Type 2 diabetes mellitus with other specified complication (HCC)    Morbid obesity (HCC)    Ischemic cardiomyopathy    Chronic systolic heart failure (HCC)    S/P CABG (coronary artery bypass graft) 11/02/2000   ONSET DATE: 2-3 years ago  REFERRING DIAG: Recurrent falls   THERAPY DIAG:  History of falling  Muscle weakness (generalized)  Dizziness and giddiness  Rationale for Evaluation and Treatment: Rehabilitation  SUBJECTIVE:                                                                                                                                                                                             SUBJECTIVE STATEMENT: Head is still woozy. Left shoulder is still sore, "like a nail stuck in it"--indicates posterior left shoulder   Pt accompanied by: self  PERTINENT HISTORY: Chronic systolic heart failure, hypertension, diabetes, diabetic peripheral neuropathy, osteoarthritis, chronic kidney disease, pacemaker, and history of a TIA  PAIN:  Are you having pain? Yes: NPRS scale: 5/10 Pain location: lright shoulder  at rest  PRECAUTIONS:  Fall  WEIGHT BEARING RESTRICTIONS:  No  FALLS: Has patient fallen in last 6 months? Yes. Number of falls 12+  LIVING ENVIRONMENT: Lives with: lives with their spouse Lives in: House/apartment Stairs: No Has following equipment at home: Grab bars and Hurry cane  PLOF: Independent  PATIENT GOALS: improved balance  OBJECTIVE:   TODAY'S TREATMENT: 04/05/23 Activity Comments  Left sidelying test Left upbeating  Right sidelying test Right upbeating  Return to right sidelying Appears to be more downbeating at this rep  Left sidelying No response  Right sidelying No response on this rep, no nystagmus with return to upright  Right sidelying No nystagmus, but right downbeating noted with return to upright  Modified Yacovino Prominent downbeating when returning to upright sitting  Static/dynamic balance      TODAY'S TREATMENT: 03/30/23 Activity Comments  Vitals: 101/61 mmHg, 61 bpm   Brandt-Daroff -right upbeating nystagmus in right sideyling and with return to sitting, approx 4-6 beats  Deep Head Hang Appears to be downbeating nystagmus  Modified Yacovino   Vitals: 118/69 mmHg, 60 bpm         PATIENT EDUCATION: Education details: vestibular therapy, safety, and progress with therapy Person educated: Patient Education method: Explanation Education comprehension: verbalized understanding  HOME EXERCISE PROGRAM: Access Code: [email or web address] URL: [email or web address] Date: 03/03/2023 Prepared by: Shary Decamp  Exercises - Brandt-Daroff Vestibular Exercise  - 1 x daily - 7 x weekly - 3-5 reps -Sit to stand at sink 3x10 eyes closed w/ UE support - Standing Balance in Corner  - 1 x daily - 7 x weekly - 1-3 sets - 30 sec hold - Standing Balance in Corner with Eyes Closed  - 1 x daily - 7 x weekly - 1-3 sets - 30 sec hold - Corner Balance Feet Apart: Eyes Open With Head Turns  - 1 x daily - 7 x weekly - 3 sets - 3 reps  VESTIBULAR ASSESSMENT (02/23/23)   GENERAL  OBSERVATION: reports advanced neuropathy    SYMPTOM BEHAVIOR:   Subjective history: ongoing balance issues x 2 years   Non-Vestibular symptoms: tinnitus and deficits with looking up   Type of dizziness: doesn't report any obvious dizziness   Frequency: looking up, turning head   Duration:    Aggravating factors: Induced by position change:  sometimes with sit to stand  and Induced by motion: looking up at the ceiling, turning body quickly, and turning head quickly   Relieving factors: head stationary and slow movements   Progression of symptoms: unchanged   OCULOMOTOR EXAM:   Ocular Alignment: normal   Ocular ROM: No Limitations   Spontaneous Nystagmus: absent   Gaze-Induced Nystagmus: absent   Smooth Pursuits: intact   Saccades: intact and slower when looking right to midline   Convergence/Divergence: 3 cm         VESTIBULAR - OCULAR REFLEX:    Slow VOR: Normal   VOR Cancellation: Normal   Head-Impulse Test: HIT Right: negative HIT Left: negative   Dynamic Visual Acuity: Not able to be assessed      POSITIONAL TESTING: Right Dix-Hallpike: upbeating, right nystagmus Left Dix-Hallpike: no nystagmus Right Roll Test: no nystagmus Left Roll Test: no nystagmus    MOTION SENSITIVITY:    Motion Sensitivity Quotient  Intensity: 0 = none, 1 = Lightheaded, 2 = Mild, 3 = Moderate, 4 = Severe, 5 = Vomiting  Intensity  1. Sitting to supine   2. Supine to L side   3. Supine to R side   4. Supine to sitting  5. L Hallpike-Dix   6. Up from L    7. R Hallpike-Dix   8. Up from R    9. Sitting, head  tipped to L knee   10. Head up from L  knee   11. Sitting, head  tipped to R knee   12. Head up from R  knee   13. Sitting head turns x5   14.Sitting head nods x5   15. In stance, 180  turn to L    16. In stance, 180  turn to R     OTHOSTATICS: not done  FUNCTIONAL GAIT: Dynamic Gait Index: TBD M-CTSIB: TBD   VESTIBULAR TREATMENT:  Canalith  Repositioning:   Epley Right: Number of Reps: 2 Gaze Adaptation:   TBD Habituation:   Other: TBD Other:   PATIENT EDUCATION: Education details: assessmetnt findings, tx rationale. Person educated: Patient and Child(ren) Education method: Explanation and Handouts Education comprehension: verbalized understanding    COGNITION: Overall cognitive status: Within functional limits for tasks assessed   SENSATION: Light touch: Impaired  Diminished sensation in both lower extremities  EDEMA:  No edema observed  POSTURE: forward head and decreased lumbar lordosis  LOWER EXTREMITY MMT:    MMT Right Eval Left Eval  Hip flexion 4-/5 4-/5  Hip extension    Hip abduction    Hip adduction    Hip internal rotation    Hip external rotation    Knee flexion 4-/5 4/5  Knee extension 4-/5 4-/5  Ankle dorsiflexion 3/5 3/5  Ankle plantarflexion    Ankle inversion    Ankle eversion    (Blank rows = not tested)  TRANSFERS: Assistive device utilized: Environmental consultant - 2 wheeled  Sit to stand: SBA and requires use of the armrests Stand to sit: SBA and requires use of the armrests  GAIT: Gait pattern: step through pattern, decreased step length- Right, decreased step length- Left, decreased stride length, ataxic, decreased trunk rotation, wide BOS, poor foot clearance- Right, and poor foot clearance- Left Assistive device utilized: Quad cane small base Level of assistance: SBA Comments: utilized intermittent external support from walls and doors  FUNCTIONAL TESTS:  5 times sit to stand: 26.98 seconds with use of the armrests Timed up and go (TUG): 20.54 seconds with small based quad cane     GOALS: Goals reviewed with patient? Yes  SHORT TERM GOALS: Target date: 02/25/23  Patient will be independent with his initial HEP. Baseline: Goal status: MET  2.  Patient will improve his 5 times sit to stand time to 19 seconds or less for improved lower extremity power. Baseline: 26.25  seconds on 02/18/23; (02/25/23) 32.78; (03/09/23) 17 sec Goal status: MET  3.  Patient will improve his timed up and go time to 15 seconds or less for improved functional mobility. Baseline: 24.63 seconds with Rolator on 02/18/23; (02/25/23) 20 sec; (03/09/23) 16.8 sec w/ rollator Goal status: NOT MET        LONG TERM GOALS: Target date: 04/15/2023    Patient will be independent with his advanced HEP. Baseline:  Goal status: IN PROGRESS  2.  Patient will improve his 5 times sit to stand time to 15 seconds or less to reduce his risk of falling. Baseline: 15 sec Goal status: MET  3.  Patient will improve his timed up and go time to 12 seconds or less to reduce his fall risk. Baseline: (03/18/23) 16.8 sec w/ rollator Goal status: IN PROGRESS  4.  Patient will be able to transfer  from sitting to standing with minimal to no upper extremity support. Baseline:  Goal status: MET  5. Pt to remain free of positional dizziness to reduce risk for falls with mobility  Baseline: +R Dix-Hallpike; (03/18/23) -sidelying test R/L  Goal status: MET  6.  Demo low risk for falls per score 19/24 Dynamic Gait Index for improved safety with ambulation  Baseline: 11/24; (03/18/23) 14/24  Goal status: IN PROGRESS  ASSESSMENT:  CLINICAL IMPRESSION: Reports ongoing symptoms and demo right upbeating nystagmus in right sideyling test position with an element of vertical/downbeating nystagmus when returning to upright sitting.  Placed to deep head position with what appears to be downbeating nystagmus possibly indicating now multi-canal involvement. Use of modified Yacavino for repositioning. Continued with balance training to enhance static and dynamic balance with profound difficulty in unsupported standing, eyes closed condition w/ LOB every 3-5 sec.  Positional dizziness and nystagmus has been very elusive to treatment.  Will need to consider referral to next level of care  OBJECTIVE IMPAIRMENTS: Abnormal  gait, decreased activity tolerance, decreased balance, decreased mobility, difficulty walking, decreased strength, and pain.   ACTIVITY LIMITATIONS: carrying, lifting, standing, squatting, stairs, transfers, and locomotion level  PARTICIPATION LIMITATIONS: meal prep, cleaning, laundry, shopping, community activity, and yard work  PERSONAL FACTORS: Age, Fitness, Time since onset of injury/illness/exacerbation, and 3+ comorbidities: Chronic systolic heart failure, hypertension, diabetes, diabetic peripheral neuropathy, osteoarthritis, chronic kidney disease, pacemaker, and history of a TIA  are also affecting patient's functional outcome.   REHAB POTENTIAL: Fair    CLINICAL DECISION MAKING: Unstable/unpredictable  EVALUATION COMPLEXITY: High  PLAN:  PT FREQUENCY: 1-2x/week  PT DURATION: 4 weeks  PLANNED INTERVENTIONS: Therapeutic exercises, Therapeutic activity, Neuromuscular re-education, Balance training, Gait training, Patient/Family education, Self Care, Stair training, Vestibular training, Canalith repositioning, Visual/preceptual remediation/compensation, Cryotherapy, Moist heat, and Re-evaluation  PLAN FOR NEXT SESSION: , balance HEP review, positional tests/tx as indicated  9:32 AM, 04/05/23 M. Shary Decamp, PT, DPT Physical Therapist- [place] Office Number: [phone]

## 2023-04-12 ENCOUNTER — Other Ambulatory Visit: Payer: Self-pay | Admitting: Family Medicine

## 2023-04-12 ENCOUNTER — Ambulatory Visit: Payer: Medicare Other

## 2023-04-12 DIAGNOSIS — R42 Dizziness and giddiness: Secondary | ICD-10-CM | POA: Diagnosis not present

## 2023-04-12 DIAGNOSIS — Z9181 History of falling: Secondary | ICD-10-CM | POA: Diagnosis not present

## 2023-04-12 DIAGNOSIS — M6281 Muscle weakness (generalized): Secondary | ICD-10-CM

## 2023-04-12 NOTE — Therapy (Signed)
OUTPATIENT PHYSICAL THERAPY NEURO TREATMENT and D/C Summary   Patient Name: Manuel Kline MRN: [id number] DOB:[birth year], 89 y.o., male Today's Date: 04/12/2023  REFERRING PROVIDER: Dettinger, Elige Radon, MD   PHYSICAL THERAPY DISCHARGE SUMMARY  Visits from Start of Care: 16  Current functional level related to goals / functional outcomes: Progressed overall with POC details and demo good recall for management strategies    Remaining deficits: Positional dizziness present Right side w/ +right Dix-Hallpike revealing downbeating nystagmus   Education / Equipment: HEP, recommend use of AFO due to bilateral foot drop   Patient agrees to discharge. Patient goals were partially met. Patient is being discharged due to maximized rehab potential.   END OF SESSION:  PT End of Session - 04/12/23 0933     Visit Number 16    Number of Visits 18    Date for PT Re-Evaluation 04/15/23    Authorization Type [employer] Medicare    PT Start Time 0930    PT Stop Time 1015    PT Time Calculation (min) 45 min    Equipment Utilized During Treatment Other (comment)   Rollator   Activity Tolerance Patient tolerated treatment well    Behavior During Therapy [facility] for tasks assessed/performed            Past Medical History:  Diagnosis Date   Adenomatous colon polyp 2006   CAD (coronary artery disease)    Calcium oxalate renal stones    Cardiomyopathy    Cataract    Cataract    Cerebral embolism with cerebral infarction 12/26/2018   Chronic systolic heart failure (HCC)         CKD (chronic kidney disease) stage 4, GFR 15-29 ml/min (HCC) 11/08/2017   Claudication in peripheral vascular disease (HCC) 07/23/2020   Dehydration 07/2022   Diabetes (HCC)    Diabetic peripheral neuropathy (HCC) 01/31/2015   Dual implantable cardioverter-defibrillator in situ    01/16/2002 Dr. Ladona Ridgel  RA lead  Guidant [id number] RV lead  Guidant [id number] Generator  Guidant Prism  09/02/2009 Generator change Medtronic  [id number]  SN  [id number]      Erectile dysfunction    Erosive esophagitis    Essential hypertension 11/30/2015   Hemorrhoids    History of TIA (transient ischemic attack) 01/19/2019   HLD (hyperlipidemia)    HTN (hypertension)    Hyperlipidemia    Hypertensive heart disease without CHF 07/31/2011   ICD (implantable cardiac defibrillator) in place    ICD dual chamber in situ    Ischemic cardiomyopathy    EF 40% June 2013    Left-sided weakness 12/25/2018   Metabolic syndrome    Moderate nonproliferative diabetic retinopathy of both eyes without macular edema associated with type 2 diabetes mellitus (HCC) 02/20/2020   Morbid obesity (HCC)    OSA on CPAP 11/05/2014   Osteoarthritis    Posterior vitreous detachment of both eyes 02/20/2020   Presence of automatic (implantable) cardiac defibrillator 11/08/2017   S/P CABG (coronary artery bypass graft) 11/02/2000   Sleep apnea    Stroke-like symptoms 12/26/2018   Syncope 08/06/2009   Qualifier: Diagnosis of  By: Susette Racer CMA, Jewel     Type 2 diabetes, uncontrolled, with neuropathy    Has retinopahty and neuropathy     Ventricular tachycardia (paroxysmal) (HCC) 07/14/2016   Past Surgical History:  Procedure Laterality Date   ABDOMINAL EXPLORATION SURGERY     BACK SURGERY     X'3   cardiac bypass     CARDIAC  DEFIBRILLATOR PLACEMENT     CARPAL TUNNEL RELEASE     X2, bilateral   CATARACT EXTRACTION     COLONOSCOPY  06/20/2012   Procedure: COLONOSCOPY;  Surgeon: Mardella Layman, MD;  Location: [facility] ENDOSCOPY;  Service: Endoscopy;  Laterality: N/A;   DOPPLER ECHOCARDIOGRAPHY  2003   EP IMPLANTABLE DEVICE N/A 07/14/2016   Procedure: ICD Generator Changeout;  Surgeon: Marinus Maw, MD;  Location: [facility] INVASIVE CV LAB;  Service: Cardiovascular;  Laterality: N/A;   ESOPHAGOGASTRODUODENOSCOPY  06/20/2012   Procedure: ESOPHAGOGASTRODUODENOSCOPY (EGD);  Surgeon: Mardella Layman, MD;  Location: [facility];  Service: Endoscopy;  Laterality:  N/A;   EYE SURGERY     LAPAROTOMY     RETINOPATHY SURGERY Bilateral    rotator cuff surgery     left   Patient Active Problem List   Diagnosis Date Noted   Fall 03/01/2023   Porokeratosis 11/04/2022   Pneumatosis of intestines 07/21/2022   Diarrhea 07/21/2022   N&V (nausea and vomiting) 07/21/2022   Hav (hallux abducto valgus), left 05/06/2022   Callus 10/29/2021   Glaucoma suspect with open angle 04/02/2021   Pseudophakia of both eyes 08/20/2020   Claudication in peripheral vascular disease (HCC) 07/23/2020   Sleep apnea    Osteoarthritis    Metabolic syndrome    Erosive esophagitis    Erectile dysfunction    Calcium oxalate renal stones    Moderate nonproliferative diabetic retinopathy of both eyes without macular edema associated with type 2 diabetes mellitus (HCC) 02/20/2020   Posterior vitreous detachment of both eyes 02/20/2020   History of TIA (transient ischemic attack) 01/19/2019   Cerebral embolism with cerebral infarction 12/26/2018   CKD (chronic kidney disease) stage 4, GFR 15-29 ml/min (HCC) 11/08/2017   ICD (implantable cardioverter-defibrillator) in place 11/08/2017   Ventricular tachycardia (paroxysmal) (HCC) 07/14/2016   Essential hypertension 11/30/2015   Diabetic peripheral neuropathy (HCC) 01/31/2015   OSA on CPAP 11/05/2014   CAD (coronary artery disease)    Dual implantable cardioverter-defibrillator in situ    Hyperlipidemia    Type 2 diabetes mellitus with other specified complication (HCC)    Morbid obesity (HCC)    Ischemic cardiomyopathy    Chronic systolic heart failure (HCC)    S/P CABG (coronary artery bypass graft) 11/02/2000   ONSET DATE: 2-3 years ago  REFERRING DIAG: Recurrent falls   THERAPY DIAG:  History of falling  Muscle weakness (generalized)  Dizziness and giddiness  Rationale for Evaluation and Treatment: Rehabilitation  SUBJECTIVE:  SUBJECTIVE STATEMENT: Dizziness feels about the same.  Left shoulder continues to be sore   Pt accompanied by: self  PERTINENT HISTORY: Chronic systolic heart failure, hypertension, diabetes, diabetic peripheral neuropathy, osteoarthritis, chronic kidney disease, pacemaker, and history of a TIA  PAIN:  Are you having pain? Yes: NPRS scale: 5/10 Pain location: lright shoulder  at rest  PRECAUTIONS: Fall  WEIGHT BEARING RESTRICTIONS: No  FALLS: Has patient fallen in last 6 months? Yes. Number of falls 12+  LIVING ENVIRONMENT: Lives with: lives with their spouse Lives in: House/apartment Stairs: No Has following equipment at home: Grab bars and Hurry cane  PLOF: Independent  PATIENT GOALS: improved balance  OBJECTIVE:   TODAY'S TREATMENT: 04/12/23 Activity Comments  Right sideyling -right downbeating nystagmus-when returns to upright downbeating present  Left sidelying  -left upbeating  Head Impulse Test +right, -left  Test of Skew -unrevealing, no abnormality  End-range nystagmus w/ gaze No direction changing, a few beats right-not concerning  Repeated 180 degree turns Transferring cones from counter to table  Repeated forward bending 6x, one retro LOB, not dizzy more neuropathy  Sit-stand w/ cone tap 1x10 BUE support on walker  Static standing balance      TODAY'S TREATMENT: 04/05/23 Activity Comments  Left sidelying test Left upbeating  Right sidelying test Right upbeating  Return to right sidelying Appears to be more downbeating at this rep  Left sidelying No response  Right sidelying No response on this rep, no nystagmus with return to upright  Right sidelying No nystagmus, but right downbeating noted with return to upright  Modified Yacovino Prominent downbeating when returning to upright sitting  Static/dynamic balance          PATIENT EDUCATION: Education  details: vestibular therapy, safety, and progress with therapy Person educated: Patient Education method: Explanation Education comprehension: verbalized understanding  HOME EXERCISE PROGRAM: Access Code: [email or web address] URL: [email or web address] Date: 03/03/2023 Prepared by: Shary Decamp  Exercises - Brandt-Daroff Vestibular Exercise  - 1 x daily - 7 x weekly - 3-5 reps -Sit to stand at sink 3x10 eyes closed w/ UE support - Standing Balance in Corner  - 1 x daily - 7 x weekly - 1-3 sets - 30 sec hold - Standing Balance in Corner with Eyes Closed  - 1 x daily - 7 x weekly - 1-3 sets - 30 sec hold - Corner Balance Feet Apart: Eyes Open With Head Turns  - 1 x daily - 7 x weekly - 3 sets - 3 reps  VESTIBULAR ASSESSMENT (02/23/23)   GENERAL OBSERVATION: reports advanced neuropathy    SYMPTOM BEHAVIOR:   Subjective history: ongoing balance issues x 2 years   Non-Vestibular symptoms: tinnitus and deficits with looking up   Type of dizziness: doesn't report any obvious dizziness   Frequency: looking up, turning head   Duration:    Aggravating factors: Induced by position change:  sometimes with sit to stand  and Induced by motion: looking up at the ceiling, turning body quickly, and turning head quickly   Relieving factors: head stationary and slow movements   Progression of symptoms: unchanged   OCULOMOTOR EXAM:   Ocular Alignment: normal   Ocular ROM: No Limitations   Spontaneous Nystagmus: absent   Gaze-Induced Nystagmus: absent   Smooth Pursuits: intact   Saccades: intact and slower when looking right to midline   Convergence/Divergence: 3 cm         VESTIBULAR - OCULAR REFLEX:    Slow VOR: Normal   VOR Cancellation: Normal  Head-Impulse Test: HIT Right: negative HIT Left: negative   Dynamic Visual Acuity: Not able to be assessed      POSITIONAL TESTING: Right Dix-Hallpike: upbeating, right nystagmus Left Dix-Hallpike: no nystagmus Right Roll Test: no  nystagmus Left Roll Test: no nystagmus    MOTION SENSITIVITY:    Motion Sensitivity Quotient  Intensity: 0 = none, 1 = Lightheaded, 2 = Mild, 3 = Moderate, 4 = Severe, 5 = Vomiting  Intensity  1. Sitting to supine   2. Supine to L side   3. Supine to R side   4. Supine to sitting   5. L Hallpike-Dix   6. Up from L    7. R Hallpike-Dix   8. Up from R    9. Sitting, head  tipped to L knee   10. Head up from L  knee   11. Sitting, head  tipped to R knee   12. Head up from R  knee   13. Sitting head turns x5   14.Sitting head nods x5   15. In stance, 180  turn to L    16. In stance, 180  turn to R     OTHOSTATICS: not done  FUNCTIONAL GAIT: Dynamic Gait Index: TBD M-CTSIB: TBD   VESTIBULAR TREATMENT:  Canalith Repositioning:   Epley Right: Number of Reps: 2 Gaze Adaptation:   TBD Habituation:   Other: TBD Other:   PATIENT EDUCATION: Education details: assessmetnt findings, tx rationale. Person educated: Patient and Child(ren) Education method: Explanation and Handouts Education comprehension: verbalized understanding    COGNITION: Overall cognitive status: Within functional limits for tasks assessed   SENSATION: Light touch: Impaired  Diminished sensation in both lower extremities  EDEMA:  No edema observed  POSTURE: forward head and decreased lumbar lordosis  LOWER EXTREMITY MMT:    MMT Right Eval Left Eval  Hip flexion 4-/5 4-/5  Hip extension    Hip abduction    Hip adduction    Hip internal rotation    Hip external rotation    Knee flexion 4-/5 4/5  Knee extension 4-/5 4-/5  Ankle dorsiflexion 3/5 3/5  Ankle plantarflexion    Ankle inversion    Ankle eversion    (Blank rows = not tested)  TRANSFERS: Assistive device utilized: Environmental consultant - 2 wheeled  Sit to stand: SBA and requires use of the armrests Stand to sit: SBA and requires use of the armrests  GAIT: Gait pattern: step through pattern, decreased step length- Right,  decreased step length- Left, decreased stride length, ataxic, decreased trunk rotation, wide BOS, poor foot clearance- Right, and poor foot clearance- Left Assistive device utilized: Quad cane small base Level of assistance: SBA Comments: utilized intermittent external support from walls and doors  FUNCTIONAL TESTS:  5 times sit to stand: 26.98 seconds with use of the armrests Timed up and go (TUG): 20.54 seconds with small based quad cane     GOALS: Goals reviewed with patient? Yes  SHORT TERM GOALS: Target date: 02/25/23  Patient will be independent with his initial HEP. Baseline: Goal status: MET  2.  Patient will improve his 5 times sit to stand time to 19 seconds or less for improved lower extremity power. Baseline: 26.25 seconds on 02/18/23; (02/25/23) 32.78; (03/09/23) 17 sec Goal status: MET  3.  Patient will improve his timed up and go time to 15 seconds or less for improved functional mobility. Baseline: 24.63 seconds with Rolator on 02/18/23; (02/25/23) 20 sec; (03/09/23) 16.8 sec w/ rollator Goal  status: NOT MET        LONG TERM GOALS: Target date: 04/15/2023    Patient will be independent with his advanced HEP. Baseline:  Goal status: MET  2.  Patient will improve his 5 times sit to stand time to 15 seconds or less to reduce his risk of falling. Baseline: 15 sec Goal status: MET  3.  Patient will improve his timed up and go time to 12 seconds or less to reduce his fall risk. Baseline: (03/18/23) 16.8 sec w/ rollator; (04/12/23) 17 sec Goal status: NOT MET  4.  Patient will be able to transfer from sitting to standing with minimal to no upper extremity support. Baseline:  Goal status: MET  5. Pt to remain free of positional dizziness to reduce risk for falls with mobility  Baseline: +R Dix-Hallpike; (03/18/23) -sidelying test R/L; (04/12/23) +Right Dix-Hallpike w/ downbeating nystagmus  Goal status: NOT MET  6.  Demo low risk for falls per score 19/24 Dynamic  Gait Index for improved safety with ambulation  Baseline: 11/24; (03/18/23) 14/24  Goal status: NOT MET  ASSESSMENT:  CLINICAL IMPRESSION: Right sidelying test reveals anterior downbeating nystagmus, brief left upbeating nystagmus in left Sidelying test position.  Thankfully, the HINTS exam was reassuring with signs consistent with peripheral issue vs central.  Vertigo and nystagmus seem to subside with repetitions of Brandt-Daroff.  Review of POC details and HEP review with improved ability to perform unsupported standing and 180 degree turns albeit with need for several short steps and repositioning but this is more of an element of his perihperal neuropathy most likely.  Recommend D/C of PT services at this time and maintain practice of daily Brandt-Daroff for habituation and static balance activities in corner for safety.  Pt has bilateral foot drop from neuropathy and would improve his safety in transfers and gait with AFO intervention.  This is being followed by podiatry.  D/C to HEP at this time  OBJECTIVE IMPAIRMENTS: Abnormal gait, decreased activity tolerance, decreased balance, decreased mobility, difficulty walking, decreased strength, and pain.   ACTIVITY LIMITATIONS: carrying, lifting, standing, squatting, stairs, transfers, and locomotion level  PARTICIPATION LIMITATIONS: meal prep, cleaning, laundry, shopping, community activity, and yard work  PERSONAL FACTORS: Age, Fitness, Time since onset of injury/illness/exacerbation, and 3+ comorbidities: Chronic systolic heart failure, hypertension, diabetes, diabetic peripheral neuropathy, osteoarthritis, chronic kidney disease, pacemaker, and history of a TIA  are also affecting patient's functional outcome.   REHAB POTENTIAL: Fair    CLINICAL DECISION MAKING: Unstable/unpredictable  EVALUATION COMPLEXITY: High  PLAN:  PT FREQUENCY: 1-2x/week  PT DURATION: 4 weeks  PLANNED INTERVENTIONS: Therapeutic exercises, Therapeutic  activity, Neuromuscular re-education, Balance training, Gait training, Patient/Family education, Self Care, Stair training, Vestibular training, Canalith repositioning, Visual/preceptual remediation/compensation, Cryotherapy, Moist heat, and Re-evaluation  PLAN FOR NEXT SESSION: D/C to HEP at this time  9:34 AM, 04/12/23 M. Shary Decamp, PT, DPT Physical Therapist- [place] Office Number: [phone]

## 2023-04-14 ENCOUNTER — Ambulatory Visit: Payer: Medicare Other

## 2023-04-14 ENCOUNTER — Encounter: Payer: Self-pay | Admitting: Podiatry

## 2023-04-14 ENCOUNTER — Ambulatory Visit: Payer: Medicare Other | Admitting: Podiatry

## 2023-04-14 DIAGNOSIS — I739 Peripheral vascular disease, unspecified: Secondary | ICD-10-CM

## 2023-04-14 DIAGNOSIS — M79675 Pain in left toe(s): Secondary | ICD-10-CM

## 2023-04-14 DIAGNOSIS — Q828 Other specified congenital malformations of skin: Secondary | ICD-10-CM | POA: Diagnosis not present

## 2023-04-14 DIAGNOSIS — E0843 Diabetes mellitus due to underlying condition with diabetic autonomic (poly)neuropathy: Secondary | ICD-10-CM

## 2023-04-14 DIAGNOSIS — M79674 Pain in right toe(s): Secondary | ICD-10-CM | POA: Diagnosis not present

## 2023-04-14 DIAGNOSIS — B351 Tinea unguium: Secondary | ICD-10-CM

## 2023-04-14 NOTE — Progress Notes (Signed)
This patient returns to my office for at risk foot care.  This patient requires this care by a professional since this patient will be at risk due to having diabetic neuropathy claudication in PVD and CKD.  This patient is unable to cut nails himself since the patient cannot reach his nails.These nails are painful walking and wearing shoes. Patient presents to the office with his daughter.  They are also appointed for pedorthist for according to the daughter for treatment of foot drop.  I checked my noted and I never diagnosed him with foot drop.  He has been going to PT who recommended a brace. This patient presents for at risk foot care today.  General Appearance  Alert, conversant and in no acute stress.  Vascular  Dorsalis pedis and posterior tibial  pulses are weakly  palpable  bilaterally.  Capillary return is within normal limits  bilaterally  Cold feet  bilaterally.  Absent digital hair noted.  Neurologic  Senn-Weinstein monofilament wire test diminished  bilaterally. Muscle power within normal limits bilaterally.  He said he felt nothing with monofilament wire.  He also said he felt nothing even though I did not touch  his foot.  Nails Thick disfigured discolored nails with subungual debris  from hallux to fifth toes bilaterally. No evidence of bacterial infection or drainage bilaterally.  Orthopedic  No limitations of motion  feet .  No crepitus or effusions noted.  No bony pathology or digital deformities noted.  Plantar flexed fifth metatarsal head left foot.  HAV  left  foot with increased temperature and swelling.  Skin  normotropic skin with no porokeratosis noted bilaterally.  No signs of infections or ulcers noted.   Porokeratosis sub 5th met left foot.  Onychomycosis  Pain in right toes  Pain in left toes  Porokeratosis sub 5 left foot.  Consent was obtained for treatment procedures.   Mechanical debridement of nails 1-5  bilaterally performed with a nail nipper.  Filed with dremel  tool.Debridement of porokeratosis sub 5th met left foot.   Return office visit  12 weeks                   Told patient to return for periodic foot care and evaluation due to potential at risk complications.   Helane Gunther DPM

## 2023-04-14 NOTE — Progress Notes (Signed)
Patient was here with daughter after seeing Dr Stacie Acres for nail trim. Daughter said that PT / therapist states that patient needs AFO bracing. Patient has balance issues it seems when ambulating. Daughter reports that patient has fallen after catching foot from foot drop, she believes it derives from loss of sensation caused by neuropathy. She would like to set an appt. With DPM here to further evaluate for AFO's  If AFO's are ordered DM shoes should be measured for after AFO's are fit for accuracy  Addison Bailey Cped, CFo, CFm

## 2023-04-16 ENCOUNTER — Ambulatory Visit (INDEPENDENT_AMBULATORY_CARE_PROVIDER_SITE_OTHER): Payer: Medicare Other

## 2023-04-16 ENCOUNTER — Encounter: Payer: Self-pay | Admitting: Podiatry

## 2023-04-16 ENCOUNTER — Ambulatory Visit: Payer: Medicare Other | Admitting: Podiatry

## 2023-04-16 DIAGNOSIS — M779 Enthesopathy, unspecified: Secondary | ICD-10-CM | POA: Diagnosis not present

## 2023-04-16 DIAGNOSIS — R2681 Unsteadiness on feet: Secondary | ICD-10-CM

## 2023-04-16 DIAGNOSIS — E1142 Type 2 diabetes mellitus with diabetic polyneuropathy: Secondary | ICD-10-CM | POA: Diagnosis not present

## 2023-04-18 NOTE — Progress Notes (Signed)
Subjective:   Patient ID: Manuel Kline, male   DOB: 89 y.o.   MRN: [id number]   HPI Patient presents with chronic instability in both lower legs severe neuropathic condition bilateral that makes it difficult for him to feel his feet or legs long-term diabetes obesity is complicating factor.   ROS      Objective:  Physical Exam  Neurological found to be severely diminished with diminishment of sharp dull vibratory and also muscle strength.  Patient does not walk with a good heel toe gait pattern and appears unstable     Assessment:  Significant gait instability bilateral secondary to long-term diabetes with diminishment of neurological sensation bilateral H&P     Plan:  We reviewed the falls he has had with caregiver and at this point I do not think he can tolerate any kind of AFO brace but I would recommend balance bracing and I discussed with the pedorthist who agrees and he is scheduled for balance bracing.  All questions answered and he still is can have to be very careful in the future

## 2023-04-21 ENCOUNTER — Ambulatory Visit: Payer: Medicare Other

## 2023-04-21 DIAGNOSIS — I255 Ischemic cardiomyopathy: Secondary | ICD-10-CM

## 2023-04-21 LAB — CUP PACEART REMOTE DEVICE CHECK
Battery Remaining Longevity: 36 mo
Battery Voltage: 2.95 V
Brady Statistic AP VP Percent: 0.05 %
Brady Statistic AP VS Percent: 80.47 %
Brady Statistic AS VP Percent: 0.02 %
Brady Statistic AS VS Percent: 19.46 %
Brady Statistic RA Percent Paced: 80.49 %
Brady Statistic RV Percent Paced: 0.06 %
Date Time Interrogation Session: 20240821121607
HighPow Impedance: 54 Ohm
HighPow Impedance: 73 Ohm
Implantable Lead Connection Status: 753985
Implantable Lead Connection Status: 753985
Implantable Lead Implant Date: 20030519
Implantable Lead Implant Date: 20030519
Implantable Lead Location: 753859
Implantable Lead Location: 753860
Implantable Lead Model: 158
Implantable Lead Model: 4087
Implantable Lead Serial Number: 115102
Implantable Lead Serial Number: 159999
Implantable Pulse Generator Implant Date: 20171114
Lead Channel Impedance Value: 4047 Ohm
Lead Channel Impedance Value: 4047 Ohm
Lead Channel Impedance Value: 4047 Ohm
Lead Channel Impedance Value: 437 Ohm
Lead Channel Impedance Value: 570 Ohm
Lead Channel Impedance Value: 608 Ohm
Lead Channel Pacing Threshold Amplitude: 0.875 V
Lead Channel Pacing Threshold Amplitude: 1.125 V
Lead Channel Pacing Threshold Pulse Width: 0.4 ms
Lead Channel Pacing Threshold Pulse Width: 0.4 ms
Lead Channel Sensing Intrinsic Amplitude: 11.75 mV
Lead Channel Sensing Intrinsic Amplitude: 11.75 mV
Lead Channel Sensing Intrinsic Amplitude: 3 mV
Lead Channel Sensing Intrinsic Amplitude: 3 mV
Lead Channel Setting Pacing Amplitude: 2 V
Lead Channel Setting Pacing Amplitude: 2.5 V
Lead Channel Setting Pacing Pulse Width: 0.4 ms
Lead Channel Setting Sensing Sensitivity: 0.3 mV
Zone Setting Status: 755011

## 2023-04-26 ENCOUNTER — Ambulatory Visit (INDEPENDENT_AMBULATORY_CARE_PROVIDER_SITE_OTHER): Payer: Medicare Other

## 2023-04-26 ENCOUNTER — Other Ambulatory Visit: Payer: Self-pay | Admitting: Family Medicine

## 2023-04-26 DIAGNOSIS — R2689 Other abnormalities of gait and mobility: Secondary | ICD-10-CM

## 2023-04-26 DIAGNOSIS — M779 Enthesopathy, unspecified: Secondary | ICD-10-CM | POA: Diagnosis not present

## 2023-04-26 DIAGNOSIS — E1142 Type 2 diabetes mellitus with diabetic polyneuropathy: Secondary | ICD-10-CM

## 2023-04-26 DIAGNOSIS — M2012 Hallux valgus (acquired), left foot: Secondary | ICD-10-CM

## 2023-04-26 DIAGNOSIS — M216X9 Other acquired deformities of unspecified foot: Secondary | ICD-10-CM | POA: Diagnosis not present

## 2023-04-26 DIAGNOSIS — M1000 Idiopathic gout, unspecified site: Secondary | ICD-10-CM | POA: Diagnosis not present

## 2023-04-26 DIAGNOSIS — R2681 Unsteadiness on feet: Secondary | ICD-10-CM

## 2023-04-26 NOTE — Progress Notes (Signed)
Patient was seen today for BIL Moore balance brace. Patient has Balance issues, instability with weakness in BIL LE's from PN. Casted today with out incident  AFOs will help increase support and satability by providing Ankle Foot support by bracing and with combined foot plate to increase floor reaction and icreased dorsi-flexion with in ankle   Charges added Financials signed may need to get approval from INS   Addison Bailey Cped, CFo, CFm

## 2023-04-28 DIAGNOSIS — N184 Chronic kidney disease, stage 4 (severe): Secondary | ICD-10-CM | POA: Diagnosis not present

## 2023-04-29 ENCOUNTER — Encounter: Payer: Self-pay | Admitting: Family Medicine

## 2023-04-29 ENCOUNTER — Other Ambulatory Visit: Payer: Self-pay | Admitting: Family Medicine

## 2023-04-29 ENCOUNTER — Ambulatory Visit (INDEPENDENT_AMBULATORY_CARE_PROVIDER_SITE_OTHER): Payer: Medicare Other | Admitting: Family Medicine

## 2023-04-29 ENCOUNTER — Ambulatory Visit (INDEPENDENT_AMBULATORY_CARE_PROVIDER_SITE_OTHER): Payer: Medicare Other

## 2023-04-29 VITALS — BP 115/84 | HR 74 | Ht 64.0 in | Wt 218.0 lb

## 2023-04-29 DIAGNOSIS — I130 Hypertensive heart and chronic kidney disease with heart failure and stage 1 through stage 4 chronic kidney disease, or unspecified chronic kidney disease: Secondary | ICD-10-CM

## 2023-04-29 DIAGNOSIS — R0989 Other specified symptoms and signs involving the circulatory and respiratory systems: Secondary | ICD-10-CM

## 2023-04-29 DIAGNOSIS — R0602 Shortness of breath: Secondary | ICD-10-CM | POA: Diagnosis not present

## 2023-04-29 DIAGNOSIS — Z7984 Long term (current) use of oral hypoglycemic drugs: Secondary | ICD-10-CM

## 2023-04-29 DIAGNOSIS — R531 Weakness: Secondary | ICD-10-CM

## 2023-04-29 DIAGNOSIS — Z950 Presence of cardiac pacemaker: Secondary | ICD-10-CM | POA: Diagnosis not present

## 2023-04-29 DIAGNOSIS — E1169 Type 2 diabetes mellitus with other specified complication: Secondary | ICD-10-CM | POA: Diagnosis not present

## 2023-04-29 DIAGNOSIS — Z794 Long term (current) use of insulin: Secondary | ICD-10-CM | POA: Diagnosis not present

## 2023-04-29 DIAGNOSIS — I5022 Chronic systolic (congestive) heart failure: Secondary | ICD-10-CM

## 2023-04-29 DIAGNOSIS — E782 Mixed hyperlipidemia: Secondary | ICD-10-CM | POA: Diagnosis not present

## 2023-04-29 DIAGNOSIS — U071 COVID-19: Secondary | ICD-10-CM

## 2023-04-29 DIAGNOSIS — E119 Type 2 diabetes mellitus without complications: Secondary | ICD-10-CM

## 2023-04-29 DIAGNOSIS — E1142 Type 2 diabetes mellitus with diabetic polyneuropathy: Secondary | ICD-10-CM

## 2023-04-29 DIAGNOSIS — I10 Essential (primary) hypertension: Secondary | ICD-10-CM

## 2023-04-29 DIAGNOSIS — E113393 Type 2 diabetes mellitus with moderate nonproliferative diabetic retinopathy without macular edema, bilateral: Secondary | ICD-10-CM

## 2023-04-29 DIAGNOSIS — R918 Other nonspecific abnormal finding of lung field: Secondary | ICD-10-CM | POA: Diagnosis not present

## 2023-04-29 DIAGNOSIS — N184 Chronic kidney disease, stage 4 (severe): Secondary | ICD-10-CM

## 2023-04-29 DIAGNOSIS — E1122 Type 2 diabetes mellitus with diabetic chronic kidney disease: Secondary | ICD-10-CM

## 2023-04-29 DIAGNOSIS — I700 Atherosclerosis of aorta: Secondary | ICD-10-CM | POA: Diagnosis not present

## 2023-04-29 LAB — CBC WITH DIFFERENTIAL/PLATELET
Basophils Absolute: 0 10*3/uL (ref 0.0–0.2)
Basos: 0 %
EOS (ABSOLUTE): 0.1 10*3/uL (ref 0.0–0.4)
Eos: 1 %
Hematocrit: 39.3 % (ref 37.5–51.0)
Hemoglobin: 12.9 g/dL — ABNORMAL LOW (ref 13.0–17.7)
Immature Grans (Abs): 0 10*3/uL (ref 0.0–0.1)
Immature Granulocytes: 0 %
Lymphocytes Absolute: 1.6 10*3/uL (ref 0.7–3.1)
Lymphs: 16 %
MCH: 30.2 pg (ref 26.6–33.0)
MCHC: 32.8 g/dL (ref 31.5–35.7)
MCV: 92 fL (ref 79–97)
Monocytes Absolute: 1.2 10*3/uL — ABNORMAL HIGH (ref 0.1–0.9)
Monocytes: 12 %
Neutrophils Absolute: 6.8 10*3/uL (ref 1.4–7.0)
Neutrophils: 71 %
Platelets: 170 10*3/uL (ref 150–450)
RBC: 4.27 x10E6/uL (ref 4.14–5.80)
RDW: 14.2 % (ref 11.6–15.4)
WBC: 9.6 10*3/uL (ref 3.4–10.8)

## 2023-04-29 LAB — MICROSCOPIC EXAMINATION
Epithelial Cells (non renal): NONE SEEN /hpf (ref 0–10)
RBC, Urine: NONE SEEN /hpf (ref 0–2)
Renal Epithel, UA: NONE SEEN /hpf
WBC, UA: NONE SEEN /hpf (ref 0–5)
Yeast, UA: NONE SEEN

## 2023-04-29 LAB — URINALYSIS, COMPLETE
Bilirubin, UA: NEGATIVE
Ketones, UA: NEGATIVE
Leukocytes,UA: NEGATIVE
Nitrite, UA: NEGATIVE
Protein,UA: NEGATIVE
RBC, UA: NEGATIVE
Specific Gravity, UA: <1.005 — ABNORMAL LOW (ref 1.005–1.030)
Urobilinogen, Ur: 0.2 mg/dL (ref 0.2–1.0)
pH, UA: 5.5 (ref 5.0–7.5)

## 2023-04-29 LAB — LIPID PANEL
Chol/HDL Ratio: 3.3 ratio (ref 0.0–5.0)
Cholesterol, Total: 153 mg/dL (ref 100–199)
HDL: 47 mg/dL (ref >39)
LDL Chol Calc (NIH): 80 mg/dL (ref 0–99)
Triglycerides: 153 mg/dL — ABNORMAL HIGH (ref 0–149)
VLDL Cholesterol Cal: 26 mg/dL (ref 5–40)

## 2023-04-29 LAB — BAYER DCA HB A1C WAIVED: HB A1C (BAYER DCA - WAIVED): 8 % — ABNORMAL HIGH (ref 4.8–5.6)

## 2023-04-29 MED ORDER — MOLNUPIRAVIR EUA 200MG CAPSULE
4.0000 | ORAL_CAPSULE | Freq: Two times a day (BID) | ORAL | 0 refills | Status: AC
Start: 2023-04-29 — End: 2023-05-04

## 2023-04-29 NOTE — Progress Notes (Signed)
BP 115/84   Pulse 74   Ht 5\' 4"  (1.626 m)   Wt 218 lb (98.9 kg)   SpO2 98%   BMI 37.42 kg/m    Subjective:   Patient ID: Manuel Kline, male    DOB: 09/12/1931, 89 y.o.   MRN: [id number]  HPI: Manuel Kline is a 89 y.o. male presenting on 04/29/2023 for Medical Management of Chronic Issues, Diabetes, and Hyperlipidemia   HPI Type 2 diabetes mellitus Patient comes in today for recheck of his diabetes. Patient has been currently taking Trulicity and [place] [facility] doctor and Humulin. Patient is currently on an ACE inhibitor/ARB. Patient has not seen an ophthalmologist this year. Patient denies any new issues with their feet. The symptom started onset as an adult CHF and retinopathy and neuropathy and CKD stage IV ARE RELATED TO DM   Hypertension Patient is currently on benazepril and furosemide and metoprolol, and their blood pressure today is 115/84. Patient denies any lightheadedness or dizziness. Patient denies headaches, blurred vision, chest pains, shortness of breath, or weakness. Denies any side effects from medication and is content with current medication.   Hyperlipidemia and CHF and CAD Patient is coming in for recheck of his hyperlipidemia. The patient is currently taking Crestor and fish oils. They deny any issues with myalgias or history of liver damage from it. They deny any focal numbness or weakness or chest pain.   Patient comes in today complaining of 1 day history that started last night of congestion and fatigue and feeling weak and feeling ill and his family member who is here with him today says he is just not acting the same.  He was complaining of some numbness in the left arm but then they realized that he still had his tight wrap after getting dialysis yesterday and that left arm and it was on overnight.  He says the numbness is better now that they have remove the wrap.  He is just been more weak and fatigued over the past 24 hours and it is harder to get going and get  moving this morning.  Relevant past medical, surgical, family and social history reviewed and updated as indicated. Interim medical history since our last visit reviewed. Allergies and medications reviewed and updated.  Review of Systems  Constitutional:  Positive for fatigue. Negative for chills and fever.  HENT:  Positive for congestion.   Eyes:  Negative for visual disturbance.  Respiratory:  Positive for cough. Negative for shortness of breath and wheezing.   Cardiovascular:  Negative for chest pain and leg swelling.  Musculoskeletal:  Negative for back pain and gait problem.  Skin:  Negative for rash.  Neurological:  Positive for weakness. Negative for dizziness.  All other systems reviewed and are negative.   Per HPI unless specifically indicated above   Allergies as of 04/29/2023   No Known Allergies      Medication List        Accurate as of April 29, 2023 11:15 AM. If you have any questions, ask your nurse or doctor.          allopurinol 300 MG tablet Commonly known as: ZYLOPRIM Take 1/2 (one-half) tablet by mouth once daily   ascorbic acid 500 MG tablet Commonly known as: VITAMIN C Take 500 mg by mouth daily.   B-D UF III MINI PEN NEEDLES 31G X 5 MM Misc Generic drug: Insulin Pen Needle 1 each by Does not apply route 4 (four) times daily.  Basaglar KwikPen 100 UNIT/ML Inject 20 Units into the skin 2 (two) times daily.   benazepril 40 MG tablet Commonly known as: LOTENSIN Take 1 tablet (40 mg total) by mouth daily.   clopidogrel 75 MG tablet Commonly known as: PLAVIX Take 1 tablet (75 mg total) by mouth daily.   dapagliflozin propanediol 10 MG Tabs tablet Commonly known as: Farxiga Take 1 tablet (10 mg total) by mouth daily before breakfast.   DULoxetine 60 MG capsule Commonly known as: CYMBALTA Take 1 capsule (60 mg total) by mouth daily.   Fish Oil 1000 MG Cpdr Take 1 tablet by mouth daily.   fluticasone 0.05 % cream Commonly known as:  CUTIVATE Apply 1 application. topically daily.   furosemide 40 MG tablet Commonly known as: LASIX Take 1 tablet (40 mg total) by mouth 2 (two) times daily.   gabapentin 300 MG capsule Commonly known as: NEURONTIN Take 1 capsule by mouth twice daily   insulin regular 100 units/mL injection Commonly known as: HumuLIN R Inject 0.1-0.15 mLs (10-15 Units total) into the skin 3 (three) times daily before meals.   Insulin Syringe-Needle U-100 31G X 5/16" 1 ML Misc Commonly known as: RELION INSULIN SYRINGE 1ML/31G USE TO INJECT INSULIN TWICE DAILY AS DIRECTED. DX: E11.65 What changed:  how much to take how to take this when to take this   metoprolol succinate 50 MG 24 hr tablet Commonly known as: TOPROL-XL TAKE 1 TABLET BY MOUTH ONCE DAILY WITH  OR  IMMEDIATELY A MEAL   MULTIVITAMIN ADULT PO Take 1 tablet by mouth daily.   nitroGLYCERIN 0.4 MG SL tablet Commonly known as: NITROSTAT Place 1 tablet (0.4 mg total) under the tongue every 5 (five) minutes as needed for chest pain.   polyethylene glycol powder 17 GM/SCOOP powder Commonly known as: GLYCOLAX/MIRALAX Take 17 g by mouth 2 (two) times daily as needed. What changed: reasons to take this   rosuvastatin 5 MG tablet Commonly known as: CRESTOR Take 1 tablet (5 mg total) by mouth daily.   Trulicity 3 MG/0.5ML Sopn Generic drug: Dulaglutide Inject 3 mg as directed once a week. [name] patient assistance program   VITAMIN B 12 PO Take 1,000 mcg by mouth daily.         Objective:   BP 115/84   Pulse 74   Ht 5\' 4"  (1.626 m)   Wt 218 lb (98.9 kg)   SpO2 98%   BMI 37.42 kg/m   Wt Readings from Last 3 Encounters:  04/29/23 218 lb (98.9 kg)  03/10/23 212 lb (96.2 kg)  03/01/23 217 lb 12.8 oz (98.8 kg)    Physical Exam Vitals and nursing note reviewed.  Constitutional:      General: He is not in acute distress.    Appearance: He is well-developed. He is not diaphoretic.  HENT:     Mouth/Throat:      Mouth: Mucous membranes are moist.     Pharynx: No oropharyngeal exudate or posterior oropharyngeal erythema.  Eyes:     General: No scleral icterus.    Conjunctiva/sclera: Conjunctivae normal.  Neck:     Thyroid: No thyromegaly.  Cardiovascular:     Rate and Rhythm: Normal rate and regular rhythm.     Heart sounds: Normal heart sounds. No murmur heard. Pulmonary:     Effort: Pulmonary effort is normal. No respiratory distress.     Breath sounds: Normal breath sounds. No stridor. No wheezing, rhonchi or rales.  Musculoskeletal:  General: Swelling (Trace) present. No deformity. Normal range of motion.     Cervical back: Neck supple.  Lymphadenopathy:     Cervical: No cervical adenopathy.  Skin:    General: Skin is warm and dry.     Findings: No rash.  Neurological:     Mental Status: He is alert and oriented to person, place, and time.     Coordination: Coordination normal.  Psychiatric:        Behavior: Behavior normal.    Chest x-ray: No acute cardiopulmonary abnormality.  No signs of fluid.  Await final read from radiology  Urinalysis: 3+ glucose, otherwise clear   Assessment & Plan:   Problem List Items Addressed This Visit       Cardiovascular and Mediastinum   Chronic systolic heart failure (HCC) (Chronic)   Essential hypertension     Endocrine   Type 2 diabetes mellitus with other specified complication (HCC) - Primary   Relevant Orders   CBC with Differential/Platelet   Lipid panel   Bayer DCA Hb A1c Waived   Bayer DCA Hb A1c Waived   Diabetic peripheral neuropathy (HCC)   Moderate nonproliferative diabetic retinopathy of both eyes without macular edema associated with type 2 diabetes mellitus (HCC)   Diabetes mellitus treated with insulin and oral medication (HCC)     Genitourinary   CKD (chronic kidney disease) stage 4, GFR 15-29 ml/min (HCC)   Relevant Orders   CBC with Differential/Platelet   Lipid panel   Bayer DCA Hb A1c Waived     Other    Hyperlipidemia (Chronic)   Other Visit Diagnoses     Chest congestion       Relevant Orders   Urinalysis, Complete   Urine Culture   DG Chest 2 View   Weakness       Relevant Orders   Urinalysis, Complete   Urine Culture   DG Chest 2 View       A1c not doing as well at 8.0 but currently fighting an illness and may be increasing it so do not want to adjust anything today except he needs to watch his sugars and control them a little bit better when he is checking them.  Concern for possibly something going on that is causing him to feel weak and not well but I cannot pinpoint what that something would be.  Instructed them to have a short leash but if anything starts looking worse or if he starts getting sicker to call us back immediately or go to the emergency department.  She is going to do a home COVID test later today and see if it comes back positive for.  She did not want to do the send out COVID because it takes too long. Follow up plan: Return in about 3 months (around 07/30/2023), or if symptoms worsen or fail to improve, for Diabetes and hyperlipidemia and CKD.  Counseling provided for all of the vaccine components Orders Placed This Encounter  Procedures   Urine Culture   DG Chest 2 View   CBC with Differential/Platelet   Lipid panel   Bayer DCA Hb A1c Waived   Bayer DCA Hb A1c Waived   Urinalysis, Complete    Arville Care, MD [facility] 04/29/2023, 11:15 AM

## 2023-05-05 ENCOUNTER — Other Ambulatory Visit: Payer: Self-pay | Admitting: Family Medicine

## 2023-05-05 LAB — URINE CULTURE

## 2023-05-05 MED ORDER — SULFAMETHOXAZOLE-TRIMETHOPRIM 800-160 MG PO TABS: 1.0000 | ORAL_TABLET | Freq: Two times a day (BID) | ORAL | 0 refills | Status: DC

## 2023-05-05 NOTE — Progress Notes (Signed)
Pt's wife made aware of ATB being called in.  States that pt is feeling better with Covid symptoms.

## 2023-05-05 NOTE — Progress Notes (Signed)
Remote ICD transmission.   

## 2023-05-13 ENCOUNTER — Other Ambulatory Visit: Payer: Self-pay | Admitting: Family Medicine

## 2023-05-13 DIAGNOSIS — E1169 Type 2 diabetes mellitus with other specified complication: Secondary | ICD-10-CM

## 2023-05-20 DIAGNOSIS — D225 Melanocytic nevi of trunk: Secondary | ICD-10-CM | POA: Diagnosis not present

## 2023-05-20 DIAGNOSIS — B353 Tinea pedis: Secondary | ICD-10-CM | POA: Diagnosis not present

## 2023-05-20 DIAGNOSIS — L820 Inflamed seborrheic keratosis: Secondary | ICD-10-CM | POA: Diagnosis not present

## 2023-05-20 DIAGNOSIS — M721 Knuckle pads: Secondary | ICD-10-CM | POA: Diagnosis not present

## 2023-05-20 DIAGNOSIS — L814 Other melanin hyperpigmentation: Secondary | ICD-10-CM | POA: Diagnosis not present

## 2023-05-20 DIAGNOSIS — L538 Other specified erythematous conditions: Secondary | ICD-10-CM | POA: Diagnosis not present

## 2023-05-20 DIAGNOSIS — L821 Other seborrheic keratosis: Secondary | ICD-10-CM | POA: Diagnosis not present

## 2023-05-20 DIAGNOSIS — L218 Other seborrheic dermatitis: Secondary | ICD-10-CM | POA: Diagnosis not present

## 2023-05-24 NOTE — Progress Notes (Signed)
Casts were scsanned today and order placed to [place] for BIL balance braces  Once in patient will return for fitting  Addison Bailey CPed, CFo, CFm

## 2023-05-25 ENCOUNTER — Encounter: Payer: Self-pay | Admitting: Podiatry

## 2023-06-02 NOTE — Progress Notes (Signed)
Braces are in I called and LM for Velna Hatchet, patients daughter will need fitting and then will need Shoe eval 45 mins  Also will need to re-add charges as INS wants all Documentation as well as proof of delivery  Addison Bailey CPed, CFo, CFm

## 2023-06-04 ENCOUNTER — Ambulatory Visit (INDEPENDENT_AMBULATORY_CARE_PROVIDER_SITE_OTHER): Payer: Medicare Other

## 2023-06-04 DIAGNOSIS — Z23 Encounter for immunization: Secondary | ICD-10-CM | POA: Diagnosis not present

## 2023-06-05 ENCOUNTER — Other Ambulatory Visit: Payer: Self-pay | Admitting: Family Medicine

## 2023-06-15 ENCOUNTER — Other Ambulatory Visit: Payer: Medicare Other

## 2023-06-30 ENCOUNTER — Ambulatory Visit: Payer: Medicare Other

## 2023-06-30 DIAGNOSIS — M2012 Hallux valgus (acquired), left foot: Secondary | ICD-10-CM

## 2023-06-30 DIAGNOSIS — E1169 Type 2 diabetes mellitus with other specified complication: Secondary | ICD-10-CM

## 2023-06-30 DIAGNOSIS — R2689 Other abnormalities of gait and mobility: Secondary | ICD-10-CM

## 2023-06-30 DIAGNOSIS — M21619 Bunion of unspecified foot: Secondary | ICD-10-CM

## 2023-06-30 DIAGNOSIS — L84 Corns and callosities: Secondary | ICD-10-CM

## 2023-06-30 NOTE — Progress Notes (Signed)
Patient presents to the office today for diabetic shoe and insole measuring.  Patient was measured with brannock device to determine size and width for 1 pair of extra depth shoes and foam casted for 3 pair of insoles.   Documentation of medical necessity will be sent to patient's treating diabetic doctor to verify and sign.   Patient's diabetic provider: Josh dettinger  Shoes and insoles will be ordered at that time and patient will be notified for an appointment for fitting when they arrive.   Shoe size (per patient): 9 Brannock measurement: 9.5 XWD Patient shoe selection- Shoe choice:   [id number]  Shoe size ordered: 10xwd FINANCIALS SIGNED

## 2023-07-11 ENCOUNTER — Other Ambulatory Visit: Payer: Self-pay | Admitting: Family Medicine

## 2023-07-11 DIAGNOSIS — E1142 Type 2 diabetes mellitus with diabetic polyneuropathy: Secondary | ICD-10-CM

## 2023-07-15 ENCOUNTER — Encounter: Payer: Self-pay | Admitting: Cardiology

## 2023-07-15 ENCOUNTER — Ambulatory Visit: Payer: Medicare Other | Attending: Cardiology | Admitting: Cardiology

## 2023-07-15 VITALS — BP 140/70 | HR 84 | Ht 64.0 in | Wt 220.0 lb

## 2023-07-15 DIAGNOSIS — I5022 Chronic systolic (congestive) heart failure: Secondary | ICD-10-CM | POA: Diagnosis not present

## 2023-07-15 DIAGNOSIS — Z951 Presence of aortocoronary bypass graft: Secondary | ICD-10-CM | POA: Diagnosis not present

## 2023-07-15 DIAGNOSIS — Z7984 Long term (current) use of oral hypoglycemic drugs: Secondary | ICD-10-CM | POA: Diagnosis not present

## 2023-07-15 DIAGNOSIS — E119 Type 2 diabetes mellitus without complications: Secondary | ICD-10-CM | POA: Diagnosis not present

## 2023-07-15 DIAGNOSIS — I2510 Atherosclerotic heart disease of native coronary artery without angina pectoris: Secondary | ICD-10-CM

## 2023-07-15 DIAGNOSIS — Z794 Long term (current) use of insulin: Secondary | ICD-10-CM | POA: Diagnosis not present

## 2023-07-15 NOTE — Patient Instructions (Signed)

## 2023-07-15 NOTE — Progress Notes (Signed)
Cardiology Office Note:    Date:  07/15/2023   ID:  Manuel Kline, DOB Feb 09, 1932, MRN [id number]  PCP:  Dettinger, Elige Radon, MD  Cardiologist:  Gypsy Balsam, MD    Referring MD: Dettinger, Elige Radon, MD   Chief Complaint  Patient presents with   Follow-up    History of Present Illness:    Manuel Kline is a 89 y.o. male with past medical history significant for coronary artery disease, status post coronary bypass graft done by Dr. Tyrone Sage in 2002, history of ischemic cardiomyopathy however latest ejection fraction 55%, ICD present which is Medtronic device, diabetes, obstructive sleep apnea, essential hypertension, hard of hearing.  Comes today to months for follow-up overall cardiac wise doing well.  Denies have any chest pain tightness squeezing pressure burning chest.  He does complain of having some dizziness and loss of balance he fell down few times but no syncope  Past Medical History:  Diagnosis Date   Adenomatous colon polyp 2006   CAD (coronary artery disease)    Calcium oxalate renal stones    Cardiomyopathy    Cataract    Cataract    Cerebral embolism with cerebral infarction 12/26/2018   Chronic systolic heart failure (HCC)         CKD (chronic kidney disease) stage 4, GFR 15-29 ml/min (HCC) 11/08/2017   Claudication in peripheral vascular disease (HCC) 07/23/2020   Dehydration 07/2022   Diabetes (HCC)    Diabetic peripheral neuropathy (HCC) 01/31/2015   Dual implantable cardioverter-defibrillator in situ    01/16/2002 Dr. Ladona Ridgel  RA lead  Guidant [id number] RV lead  Guidant [id number] Generator  Guidant Prism  09/02/2009 Generator change Medtronic [id number]  SN  [id number]      Erectile dysfunction    Erosive esophagitis    Essential hypertension 11/30/2015   Hemorrhoids    History of TIA (transient ischemic attack) 01/19/2019   HLD (hyperlipidemia)    HTN (hypertension)    Hyperlipidemia    Hypertensive heart disease without CHF 07/31/2011   ICD  (implantable cardiac defibrillator) in place    ICD dual chamber in situ    Ischemic cardiomyopathy    EF 40% June 2013    Left-sided weakness 12/25/2018   Metabolic syndrome    Moderate nonproliferative diabetic retinopathy of both eyes without macular edema associated with type 2 diabetes mellitus (HCC) 02/20/2020   Morbid obesity (HCC)    OSA on CPAP 11/05/2014   Osteoarthritis    Posterior vitreous detachment of both eyes 02/20/2020   Presence of automatic (implantable) cardiac defibrillator 11/08/2017   S/P CABG (coronary artery bypass graft) 11/02/2000   Sleep apnea    Stroke-like symptoms 12/26/2018   Syncope 08/06/2009   Qualifier: Diagnosis of  By: Susette Racer CMA, Jewel     Type 2 diabetes, uncontrolled, with neuropathy    Has retinopahty and neuropathy     Ventricular tachycardia (paroxysmal) (HCC) 07/14/2016    Past Surgical History:  Procedure Laterality Date   ABDOMINAL EXPLORATION SURGERY     BACK SURGERY     X'3   cardiac bypass     CARDIAC DEFIBRILLATOR PLACEMENT     CARPAL TUNNEL RELEASE     X2, bilateral   CATARACT EXTRACTION     COLONOSCOPY  06/20/2012   Procedure: COLONOSCOPY;  Surgeon: Mardella Layman, MD;  Location: [facility] ENDOSCOPY;  Service: Endoscopy;  Laterality: N/A;   DOPPLER ECHOCARDIOGRAPHY  2003   EP IMPLANTABLE DEVICE N/A 07/14/2016   Procedure: ICD  Musician;  Surgeon: Marinus Maw, MD;  [facility] INVASIVE CV LAB;  Service: Cardiovascular;  Laterality: N/A;   ESOPHAGOGASTRODUODENOSCOPY  06/20/2012   Procedure: ESOPHAGOGASTRODUODENOSCOPY (EGD);  Surgeon: Mardella Layman, MD;  Location: [facility];  Service: Endoscopy;  Laterality: N/A;   EYE SURGERY     LAPAROTOMY     RETINOPATHY SURGERY Bilateral    rotator cuff surgery     left    Current Medications: Current Meds  Medication Sig   allopurinol (ZYLOPRIM) 300 MG tablet Take 1/2 (one-half) tablet by mouth once daily (Patient taking differently: Take 150 mg by mouth daily.  Take 1/2 (one-half) tablet by mouth once daily)   benazepril (LOTENSIN) 40 MG tablet Take 1 tablet (40 mg total) by mouth daily.   clopidogrel (PLAVIX) 75 MG tablet Take 1 tablet (75 mg total) by mouth daily.   Cyanocobalamin (VITAMIN B 12 PO) Take 1,000 mcg by mouth daily.   dapagliflozin propanediol (FARXIGA) 10 MG TABS tablet Take 1 tablet (10 mg total) by mouth daily before breakfast.   Dulaglutide (TRULICITY) 3 MG/0.5ML SOPN Inject 3 mg as directed once a week. [name] patient assistance program   DULoxetine (CYMBALTA) 60 MG capsule Take 1 capsule (60 mg total) by mouth daily.   fluticasone (CUTIVATE) 0.05 % cream Apply 1 application. topically daily.   furosemide (LASIX) 40 MG tablet Take 1 tablet (40 mg total) by mouth 2 (two) times daily.   gabapentin (NEURONTIN) 300 MG capsule Take 1 capsule by mouth twice daily   Insulin Glargine (BASAGLAR KWIKPEN) 100 UNIT/ML Inject 20 Units into the skin 2 (two) times daily.   Insulin Pen Needle (B-D UF III MINI PEN NEEDLES) 31G X 5 MM MISC 1 each by Does not apply route 4 (four) times daily.   insulin regular (HUMULIN R) 100 units/mL injection Inject 0.1-0.15 mLs (10-15 Units total) into the skin 3 (three) times daily before meals.   Insulin Syringe-Needle U-100 (RELION INSULIN SYRINGE 1ML/31G) 31G X 5/16" 1 ML MISC USE TO INJECT INSULIN TWICE DAILY AS DIRECTED (Patient taking differently: 1 each by Other route 2 (two) times daily. USE TO INJECT INSULIN TWICE DAILY AS DIRECTED)   metoprolol succinate (TOPROL-XL) 50 MG 24 hr tablet TAKE 1 TABLET BY MOUTH ONCE DAILY WITH OR IMMEDIATELY FOLLOWING A MEAL (Patient taking differently: Take 50 mg by mouth daily. TAKE 1 TABLET BY MOUTH ONCE DAILY WITH OR IMMEDIATELY FOLLOWING A MEAL)   Multiple Vitamin (MULTIVITAMIN ADULT PO) Take 1 tablet by mouth daily.   nitroGLYCERIN (NITROSTAT) 0.4 MG SL tablet Place 1 tablet (0.4 mg total) under the tongue every 5 (five) minutes as needed for chest pain.   Omega-3  Fatty Acids (FISH OIL) 1000 MG CPDR Take 1 tablet by mouth daily.   polyethylene glycol powder (GLYCOLAX/MIRALAX) powder Take 17 g by mouth 2 (two) times daily as needed. (Patient taking differently: Take 17 g by mouth 2 (two) times daily as needed for mild constipation or moderate constipation.)   rosuvastatin (CRESTOR) 5 MG tablet Take 1 tablet (5 mg total) by mouth daily.   sulfamethoxazole-trimethoprim (BACTRIM DS) 800-160 MG tablet Take 1 tablet by mouth 2 (two) times daily.   vitamin C (ASCORBIC ACID) 500 MG tablet Take 500 mg by mouth daily.     Allergies:   Patient has no known allergies.   Social History   Socioeconomic History   Marital status: Married    Spouse name: Alice   Number of children: 4  Years of education: 35   Highest education level: 12th grade  Occupational History   Occupation: Retired from [employer]: RETIRED  Tobacco Use   Smoking status: Never   Smokeless tobacco: Never  Vaping Use   Vaping status: Never Used  Substance and Sexual Activity   Alcohol use: No    Alcohol/week: 0.0 standard drinks of alcohol   Drug use: No   Sexual activity: Yes  Other Topics Concern   Not on file  Social History Narrative   Lives at home with wife   Right handed, Married, 4 kids from previous marriage.  Retired.     Caffeine 1 cup daily.   Social Determinants of Health   Financial Resource Strain: Low Risk  (04/28/2023)   Overall Financial Resource Strain (CARDIA)    Difficulty of Paying Living Expenses: Not hard at all  Food Insecurity: No Food Insecurity (04/28/2023)   Hunger Vital Sign    Worried About Running Out of Food in the Last Year: Never true    Ran Out of Food in the Last Year: Never true  Transportation Needs: No Transportation Needs (04/28/2023)   PRAPARE - Administrator, Civil Service (Medical): No    Lack of Transportation (Non-Medical): No  Physical Activity: Inactive (04/28/2023)   Exercise Vital Sign    Days  of Exercise per Week: 0 days    Minutes of Exercise per Session: 10 min  Stress: No Stress Concern Present (12/14/2022)   [employer] of Occupational Health - Occupational Stress Questionnaire    Feeling of Stress : Not at all  Social Connections: Socially Integrated (04/28/2023)   Social Connection and Isolation Panel [NHANES]    Frequency of Communication with Friends and Family: More than three times a week    Frequency of Social Gatherings with Friends and Family: Twice a week    Attends Religious Services: More than 4 times per year    Active Member of [employer] or Organizations: Yes    Attends Engineer, structural: More than 4 times per year    Marital Status: Married     Family History: The patient's family history includes Cancer in his brother; Diabetes in his brother, brother, brother, mother, sister, sister, sister, sister, and sister; Heart disease in his brother, father, and mother; Kidney disease in his sister; Throat cancer in his paternal uncle. ROS:   Please see the history of present illness.    All 14 point review of systems negative except as described per history of present illness  EKGs/Labs/Other Studies Reviewed:         Recent Labs: 07/22/2022: Magnesium 2.0 01/27/2023: ALT 23; BUN 33; Creatinine, Ser 2.13; Potassium 4.7; Sodium 138 04/29/2023: Hemoglobin 12.9; Platelets 170  Recent Lipid Panel    Component Value Date/Time   CHOL 153 04/29/2023 1046   TRIG 153 (H) 04/29/2023 1046   HDL 47 04/29/2023 1046   CHOLHDL 3.3 04/29/2023 1046   CHOLHDL 3.4 12/26/2018 0558   VLDL 36 12/26/2018 0558   LDLCALC 80 04/29/2023 1046    Physical Exam:    VS:  BP (!) 140/70 (BP Location: Left Arm, Patient Position: Sitting)   Pulse 84   Ht 5\' 4"  (1.626 m)   Wt 220 lb (99.8 kg)   BMI 37.76 kg/m     Wt Readings from Last 3 Encounters:  07/15/23 220 lb (99.8 kg)  04/29/23 218 lb (98.9 kg)  03/10/23 212 lb (96.2 kg)  GEN:  Well nourished, well  developed in no acute distress HEENT: Normal NECK: No JVD; No carotid bruits LYMPHATICS: No lymphadenopathy CARDIAC: RRR, no murmurs, no rubs, no gallops RESPIRATORY:  Clear to auscultation without rales, wheezing or rhonchi  ABDOMEN: Soft, non-tender, non-distended MUSCULOSKELETAL:  No edema; No deformity  SKIN: Warm and dry LOWER EXTREMITIES: no swelling NEUROLOGIC:  Alert and oriented x 3 PSYCHIATRIC:  Normal affect   ASSESSMENT:    1. Coronary artery disease involving native coronary artery of native heart without angina pectoris   2. Chronic systolic heart failure (HCC)   3. Diabetes mellitus treated with insulin and oral medication (HCC)   4. S/P CABG (coronary artery bypass graft)    PLAN:    In order of problems listed above:  Coronary disease stable from that point. Guideline directed medical therapy which we will continue. Congestive heart failure.  Compensated on physical exam. Diabetes mellitus that being followed by antimedicine team.  Stable   Medication Adjustments/Labs and Tests Ordered: Current medicines are reviewed at length with the patient today.  Concerns regarding medicines are outlined above.  No orders of the defined types were placed in this encounter.  Medication changes: No orders of the defined types were placed in this encounter.   Signed, Georgeanna Lea, MD, [facility] 07/15/2023 5:02 PM    [facility]

## 2023-07-21 ENCOUNTER — Ambulatory Visit: Payer: Medicare Other

## 2023-07-21 DIAGNOSIS — I5022 Chronic systolic (congestive) heart failure: Secondary | ICD-10-CM

## 2023-07-21 DIAGNOSIS — I255 Ischemic cardiomyopathy: Secondary | ICD-10-CM

## 2023-07-23 LAB — CUP PACEART REMOTE DEVICE CHECK
Battery Remaining Longevity: 33 mo
Battery Voltage: 2.93 V
Brady Statistic AP VP Percent: 0.13 %
Brady Statistic AP VS Percent: 86.36 %
Brady Statistic AS VP Percent: 0.01 %
Brady Statistic AS VS Percent: 13.5 %
Brady Statistic RA Percent Paced: 86.45 %
Brady Statistic RV Percent Paced: 0.13 %
Date Time Interrogation Session: 20241122095311
HighPow Impedance: 53 Ohm
HighPow Impedance: 72 Ohm
Implantable Lead Connection Status: 753985
Implantable Lead Connection Status: 753985
Implantable Lead Implant Date: 20030519
Implantable Lead Implant Date: 20030519
Implantable Lead Location: 753859
Implantable Lead Location: 753860
Implantable Lead Model: 158
Implantable Lead Model: 4087
Implantable Lead Serial Number: 115102
Implantable Lead Serial Number: 159999
Implantable Pulse Generator Implant Date: 20171114
Lead Channel Impedance Value: 4047 Ohm
Lead Channel Impedance Value: 4047 Ohm
Lead Channel Impedance Value: 4047 Ohm
Lead Channel Impedance Value: 456 Ohm
Lead Channel Impedance Value: 494 Ohm
Lead Channel Impedance Value: 494 Ohm
Lead Channel Pacing Threshold Amplitude: 1 V
Lead Channel Pacing Threshold Amplitude: 1.375 V
Lead Channel Pacing Threshold Pulse Width: 0.4 ms
Lead Channel Pacing Threshold Pulse Width: 0.4 ms
Lead Channel Sensing Intrinsic Amplitude: 12.375 mV
Lead Channel Sensing Intrinsic Amplitude: 12.375 mV
Lead Channel Sensing Intrinsic Amplitude: 2.125 mV
Lead Channel Sensing Intrinsic Amplitude: 2.125 mV
Lead Channel Setting Pacing Amplitude: 2 V
Lead Channel Setting Pacing Amplitude: 2.5 V
Lead Channel Setting Pacing Pulse Width: 0.4 ms
Lead Channel Setting Sensing Sensitivity: 0.3 mV
Zone Setting Status: 755011

## 2023-08-05 ENCOUNTER — Ambulatory Visit: Payer: Medicare Other | Admitting: Family Medicine

## 2023-08-05 ENCOUNTER — Ambulatory Visit (INDEPENDENT_AMBULATORY_CARE_PROVIDER_SITE_OTHER): Payer: Medicare Other

## 2023-08-05 ENCOUNTER — Other Ambulatory Visit: Payer: Self-pay | Admitting: Family Medicine

## 2023-08-05 ENCOUNTER — Encounter: Payer: Self-pay | Admitting: Family Medicine

## 2023-08-05 VITALS — BP 127/70 | HR 76 | Ht 64.0 in | Wt 218.0 lb

## 2023-08-05 DIAGNOSIS — Z7984 Long term (current) use of oral hypoglycemic drugs: Secondary | ICD-10-CM

## 2023-08-05 DIAGNOSIS — M2012 Hallux valgus (acquired), left foot: Secondary | ICD-10-CM

## 2023-08-05 DIAGNOSIS — E782 Mixed hyperlipidemia: Secondary | ICD-10-CM | POA: Diagnosis not present

## 2023-08-05 DIAGNOSIS — Z794 Long term (current) use of insulin: Secondary | ICD-10-CM

## 2023-08-05 DIAGNOSIS — N400 Enlarged prostate without lower urinary tract symptoms: Secondary | ICD-10-CM | POA: Insufficient documentation

## 2023-08-05 DIAGNOSIS — I5022 Chronic systolic (congestive) heart failure: Secondary | ICD-10-CM

## 2023-08-05 DIAGNOSIS — N184 Chronic kidney disease, stage 4 (severe): Secondary | ICD-10-CM

## 2023-08-05 DIAGNOSIS — E113393 Type 2 diabetes mellitus with moderate nonproliferative diabetic retinopathy without macular edema, bilateral: Secondary | ICD-10-CM

## 2023-08-05 DIAGNOSIS — I10 Essential (primary) hypertension: Secondary | ICD-10-CM

## 2023-08-05 DIAGNOSIS — M2141 Flat foot [pes planus] (acquired), right foot: Secondary | ICD-10-CM | POA: Diagnosis not present

## 2023-08-05 DIAGNOSIS — E1169 Type 2 diabetes mellitus with other specified complication: Secondary | ICD-10-CM | POA: Diagnosis not present

## 2023-08-05 DIAGNOSIS — L84 Corns and callosities: Secondary | ICD-10-CM

## 2023-08-05 DIAGNOSIS — I130 Hypertensive heart and chronic kidney disease with heart failure and stage 1 through stage 4 chronic kidney disease, or unspecified chronic kidney disease: Secondary | ICD-10-CM

## 2023-08-05 DIAGNOSIS — E1142 Type 2 diabetes mellitus with diabetic polyneuropathy: Secondary | ICD-10-CM | POA: Diagnosis not present

## 2023-08-05 DIAGNOSIS — M2142 Flat foot [pes planus] (acquired), left foot: Secondary | ICD-10-CM

## 2023-08-05 DIAGNOSIS — E1122 Type 2 diabetes mellitus with diabetic chronic kidney disease: Secondary | ICD-10-CM | POA: Diagnosis not present

## 2023-08-05 DIAGNOSIS — N401 Enlarged prostate with lower urinary tract symptoms: Secondary | ICD-10-CM | POA: Diagnosis not present

## 2023-08-05 DIAGNOSIS — R3914 Feeling of incomplete bladder emptying: Secondary | ICD-10-CM

## 2023-08-05 LAB — BAYER DCA HB A1C WAIVED: HB A1C (BAYER DCA - WAIVED): 9.2 % — ABNORMAL HIGH (ref 4.8–5.6)

## 2023-08-05 MED ORDER — TAMSULOSIN HCL 0.4 MG PO CAPS: 0.4000 mg | ORAL_CAPSULE | Freq: Every day | ORAL | 3 refills | Status: DC

## 2023-08-05 MED ORDER — INSULIN REGULAR HUMAN 100 UNIT/ML IJ SOLN: 20.0000 [IU] | Freq: Three times a day (TID) | INTRAMUSCULAR | 3 refills | Status: DC

## 2023-08-05 MED ORDER — TRULICITY 1.5 MG/0.5ML ~~LOC~~ SOAJ: 1.5000 mg | SUBCUTANEOUS | 3 refills | Status: DC

## 2023-08-05 MED ORDER — BASAGLAR KWIKPEN 100 UNIT/ML ~~LOC~~ SOPN: 24.0000 [IU] | PEN_INJECTOR | Freq: Two times a day (BID) | SUBCUTANEOUS | 5 refills | Status: DC

## 2023-08-05 MED ORDER — ALLOPURINOL 300 MG PO TABS: 150.0000 mg | ORAL_TABLET | Freq: Every day | ORAL | 3 refills | Status: DC

## 2023-08-05 NOTE — Progress Notes (Signed)

## 2023-08-05 NOTE — Progress Notes (Signed)
BP 127/70   Pulse 76   Ht 5\' 4"  (1.626 m)   Wt 218 lb (98.9 kg)   SpO2 96%   BMI 37.42 kg/m    Subjective:   Patient ID: Manuel Kline, male    DOB: 03-17-1932, 89 y.o.   MRN: [id number]  HPI: Manuel Kline is a 89 y.o. male presenting on 08/05/2023 for Medical Management of Chronic Issues, Diabetes, Hyperlipidemia, and Chronic Kidney Disease  Type 2 diabetes mellitus Patient comes in today for recheck of his diabetes. Patient is currently taking Farxiga, Hospital doctor, and Humilin R. He stopped the Trulicity due to frequent diarrhea and stool incontinence. Fasting blood sugar averages around 240 at home. Patient denies any hypoglycemic episodes. Patient has not seen an ophthalmologist this year. Patient has a bruise on his left great toe, but denies any other issues with his feet. His diabetes is complicated by diabetic neuropathy, diabetic retinopathy, and CKD.  Hypertension Patient is currently taking benazepril, furosemide, and metoprolol. His blood pressure today is 127/70. Blood pressure at home rages between 112-158 systolic and 62-85 diastolic. He denies headaches, vision changes, chest pain, or shortness of breath. He does report lightheadedness when first sitting up in bed. He will sit on the edge of the bed for a few minutes until his "head clears up."  Hyperlipidemia/CAD/CHF Patient is currently taking rosuvastatin. He denies myalgias or weakness. He does not have a history of liver damage from it.    Patient reports troubles with urination for the past three months. He has difficulty starting his urine stream, and he feels like his bladder does not completley empty. He also has urine dribbling as well as nocturia.   Relevant past medical, surgical, family and social history reviewed and updated as indicated. Interim medical history since our last visit reviewed. Allergies and medications reviewed and updated.  Review of Systems  Constitutional:  Negative for chills and fever.   HENT:  Positive for hearing loss. Negative for congestion and sore throat.   Eyes:  Negative for visual disturbance.  Respiratory:  Negative for cough, chest tightness and shortness of breath.   Cardiovascular:  Negative for chest pain, palpitations and leg swelling.  Gastrointestinal:  Positive for constipation. Negative for abdominal pain and diarrhea.       Has one bowel movement per week  Genitourinary:  Positive for difficulty urinating, enuresis, frequency and urgency. Negative for dysuria and hematuria.  Musculoskeletal:  Negative for myalgias.  Skin:  Negative for wound.  Neurological:  Positive for light-headedness. Negative for dizziness, syncope, numbness and headaches.  Psychiatric/Behavioral:  Negative for sleep disturbance.     Per HPI unless specifically indicated above   Allergies as of 08/05/2023   No Known Allergies      Medication List        Accurate as of August 05, 2023 12:53 PM. If you have any questions, ask your nurse or doctor.          STOP taking these medications    sulfamethoxazole-trimethoprim 800-160 MG tablet Commonly known as: BACTRIM DS Stopped by: Elige Radon Atticus Wedin   Trulicity 3 MG/0.5ML Soaj Generic drug: Dulaglutide Replaced by: Trulicity 1.5 MG/0.5ML Soaj Stopped by: Elige Radon Maisen Schmit       TAKE these medications    allopurinol 300 MG tablet Commonly known as: ZYLOPRIM Take 0.5 tablets (150 mg total) by mouth daily. Take 1/2 (one-half) tablet by mouth once daily What changed: See the new instructions. Changed by: Elige Radon Tasmine Hipwell  ascorbic acid 500 MG tablet Commonly known as: VITAMIN C Take 500 mg by mouth daily.   B-D UF III MINI PEN NEEDLES 31G X 5 MM Misc Generic drug: Insulin Pen Needle 1 each by Does not apply route 4 (four) times daily.   Basaglar KwikPen 100 UNIT/ML Inject 24 Units into the skin 2 (two) times daily. What changed: how much to take Changed by: Elige Radon Brantlee Penn   benazepril 40 MG  tablet Commonly known as: LOTENSIN Take 1 tablet (40 mg total) by mouth daily.   clopidogrel 75 MG tablet Commonly known as: PLAVIX Take 1 tablet (75 mg total) by mouth daily.   dapagliflozin propanediol 10 MG Tabs tablet Commonly known as: Farxiga Take 1 tablet (10 mg total) by mouth daily before breakfast.   DULoxetine 60 MG capsule Commonly known as: CYMBALTA Take 1 capsule (60 mg total) by mouth daily.   Fish Oil 1000 MG Cpdr Take 1 tablet by mouth daily.   fluticasone 0.05 % cream Commonly known as: CUTIVATE Apply 1 application. topically daily.   furosemide 40 MG tablet Commonly known as: LASIX Take 1 tablet (40 mg total) by mouth 2 (two) times daily.   gabapentin 300 MG capsule Commonly known as: NEURONTIN Take 1 capsule by mouth twice daily   insulin regular 100 units/mL injection Commonly known as: HumuLIN R Inject 0.2 mLs (20 Units total) into the skin 3 (three) times daily before meals. What changed: how much to take Changed by: Elige Radon Toniya Rozar   Insulin Syringe-Needle U-100 31G X 5/16" 1 ML Misc Commonly known as: RELION INSULIN SYRINGE 1ML/31G USE TO INJECT INSULIN TWICE DAILY AS DIRECTED What changed:  how much to take how to take this when to take this   metoprolol succinate 50 MG 24 hr tablet Commonly known as: TOPROL-XL TAKE 1 TABLET BY MOUTH ONCE DAILY WITH OR IMMEDIATELY FOLLOWING A MEAL What changed:  how much to take how to take this when to take this   MULTIVITAMIN ADULT PO Take 1 tablet by mouth daily.   nitroGLYCERIN 0.4 MG SL tablet Commonly known as: NITROSTAT Place 1 tablet (0.4 mg total) under the tongue every 5 (five) minutes as needed for chest pain.   polyethylene glycol powder 17 GM/SCOOP powder Commonly known as: GLYCOLAX/MIRALAX Take 17 g by mouth 2 (two) times daily as needed. What changed: reasons to take this   rosuvastatin 5 MG tablet Commonly known as: CRESTOR Take 1 tablet (5 mg total) by mouth daily.    tamsulosin 0.4 MG Caps capsule Commonly known as: FLOMAX Take 1 capsule (0.4 mg total) by mouth daily. Started by: Elige Radon Jasiel Belisle   Trulicity 1.5 MG/0.5ML Soaj Generic drug: Dulaglutide Inject 1.5 mg into the skin once a week. Replaces: Trulicity 3 MG/0.5ML Soaj Started by: Elige Radon Enolia Koepke   VITAMIN B 12 PO Take 1,000 mcg by mouth daily.        Objective:   BP 127/70   Pulse 76   Ht 5\' 4"  (1.626 m)   Wt 218 lb (98.9 kg)   SpO2 96%   BMI 37.42 kg/m   Wt Readings from Last 3 Encounters:  08/05/23 218 lb (98.9 kg)  07/15/23 220 lb (99.8 kg)  04/29/23 218 lb (98.9 kg)    Physical Exam Vitals and nursing note reviewed.  Constitutional:      General: He is not in acute distress.    Appearance: Normal appearance. He is obese.  HENT:     Head: Normocephalic  and atraumatic.     Right Ear: Tympanic membrane, ear canal and external ear normal.     Left Ear: Tympanic membrane, ear canal and external ear normal.  Eyes:     Conjunctiva/sclera: Conjunctivae normal.  Cardiovascular:     Rate and Rhythm: Normal rate and regular rhythm.     Heart sounds: Normal heart sounds.  Pulmonary:     Effort: Pulmonary effort is normal.     Breath sounds: Normal breath sounds. No wheezing or rales.  Abdominal:     General: Abdomen is flat. There is no distension.     Palpations: Abdomen is soft.     Tenderness: There is no abdominal tenderness.  Musculoskeletal:     Cervical back: Normal range of motion and neck supple. No tenderness.     Right lower leg: No edema.     Left lower leg: No edema.  Skin:    General: Skin is warm and dry.     Findings: No bruising.     Comments: Bruise on distal left great toe  Neurological:     Mental Status: He is alert. Mental status is at baseline.  Psychiatric:        Mood and Affect: Mood normal.        Behavior: Behavior normal.        Thought Content: Thought content normal.    Assessment & Plan:   Problem List Items Addressed  This Visit       Cardiovascular and Mediastinum   Chronic systolic heart failure (HCC) (Chronic)   Essential hypertension     Endocrine   Type 2 diabetes mellitus with other specified complication (HCC) - Primary   Relevant Medications   Dulaglutide (TRULICITY) 1.5 MG/0.5ML SOAJ   insulin regular (HUMULIN R) 100 units/mL injection   Other Relevant Orders   CBC with Differential/Platelet (Completed)   CMP14+EGFR (Completed)   Lipid panel (Completed)   Bayer DCA Hb A1c Waived (Completed)   Diabetic peripheral neuropathy (HCC)   Relevant Medications   Dulaglutide (TRULICITY) 1.5 MG/0.5ML SOAJ   insulin regular (HUMULIN R) 100 units/mL injection   Moderate nonproliferative diabetic retinopathy of both eyes without macular edema associated with type 2 diabetes mellitus (HCC)   Relevant Medications   Dulaglutide (TRULICITY) 1.5 MG/0.5ML SOAJ   insulin regular (HUMULIN R) 100 units/mL injection   Diabetes mellitus treated with insulin and oral medication (HCC)   Relevant Medications   Dulaglutide (TRULICITY) 1.5 MG/0.5ML SOAJ   insulin regular (HUMULIN R) 100 units/mL injection     Genitourinary   CKD (chronic kidney disease) stage 4, GFR 15-29 ml/min (HCC)   Relevant Orders   CBC with Differential/Platelet (Completed)   CMP14+EGFR (Completed)   Lipid panel (Completed)   Bayer DCA Hb A1c Waived (Completed)   BPH (benign prostatic hyperplasia)   Relevant Medications   tamsulosin (FLOMAX) 0.4 MG CAPS capsule     Other   Hyperlipidemia (Chronic)    Blood pressure well-controlled at 127/70. Diabetes poorly controlled with HgbA1c 9.2%. Will restart Trulicity at 1.5 mg weekly, which should help with both his blood glucose and constipation. Will increase Basaglar to 24 units twice daily and Humilin R to 20 units three times daily with meals. Encouraged son (caretaker) to help with medications to increase adherence. Will also start tamsulosin for BPH-related symptoms.   Follow up  plan: Return in about 3 months (around 11/03/2023), or if symptoms worsen or fail to improve, for Diabetes recheck.  Counseling provided for all of  the vaccine components Orders Placed This Encounter  Procedures   CBC with Differential/Platelet   CMP14+EGFR   Lipid panel   Bayer DCA Hb A1c Waived    Gillermina Phy, Medical Student [facility] 08/05/2023, 12:53 PM  Patient seen and examined with Gillermina Phy, medical student.  Agree with assessment and plan above.  Blood pressure looks good.  Diabetes does not.  He is going to restart Trulicity and then increase his Basaglar, think there is some inconsistency with medications because of mild memory issues and age.  Encouraged son to get more involved if he can. Arville Care, MD [facility] 08/13/2023, 12:35 PM

## 2023-08-05 NOTE — Patient Instructions (Signed)
Trulicity injection 1.75 mg once a week   Lantus/Glargine 24 units morning and night every day   Humulin R 20 units with breakfast lunch and dinner if he is eating.

## 2023-08-06 LAB — LIPID PANEL
Chol/HDL Ratio: 4.1 {ratio} (ref 0.0–5.0)
Cholesterol, Total: 162 mg/dL (ref 100–199)
HDL: 40 mg/dL (ref >39)
LDL Chol Calc (NIH): 81 mg/dL (ref 0–99)
Triglycerides: 247 mg/dL — ABNORMAL HIGH (ref 0–149)
VLDL Cholesterol Cal: 41 mg/dL — ABNORMAL HIGH (ref 5–40)

## 2023-08-06 LAB — CBC WITH DIFFERENTIAL/PLATELET
Basophils Absolute: 0 10*3/uL (ref 0.0–0.2)
Basos: 0 %
EOS (ABSOLUTE): 0.2 10*3/uL (ref 0.0–0.4)
Eos: 3 %
Hematocrit: 43.4 % (ref 37.5–51.0)
Hemoglobin: 14.4 g/dL (ref 13.0–17.7)
Immature Grans (Abs): 0 10*3/uL (ref 0.0–0.1)
Immature Granulocytes: 0 %
Lymphocytes Absolute: 2.2 10*3/uL (ref 0.7–3.1)
Lymphs: 28 %
MCH: 29.6 pg (ref 26.6–33.0)
MCHC: 33.2 g/dL (ref 31.5–35.7)
MCV: 89 fL (ref 79–97)
Monocytes Absolute: 0.5 10*3/uL (ref 0.1–0.9)
Monocytes: 7 %
Neutrophils Absolute: 4.9 10*3/uL (ref 1.4–7.0)
Neutrophils: 62 %
Platelets: 204 10*3/uL (ref 150–450)
RBC: 4.87 x10E6/uL (ref 4.14–5.80)
RDW: 14.3 % (ref 11.6–15.4)
WBC: 7.9 10*3/uL (ref 3.4–10.8)

## 2023-08-06 LAB — CMP14+EGFR
ALT: 18 [IU]/L (ref 0–44)
AST: 23 [IU]/L (ref 0–40)
Albumin: 4.4 g/dL (ref 3.7–4.7)
Alkaline Phosphatase: 133 [IU]/L — ABNORMAL HIGH (ref 44–121)
BUN/Creatinine Ratio: 22 (ref 10–24)
BUN: 53 mg/dL — ABNORMAL HIGH (ref 8–27)
Bilirubin Total: 0.4 mg/dL (ref 0.0–1.2)
CO2: 22 mmol/L (ref 20–29)
Calcium: 9.7 mg/dL (ref 8.6–10.2)
Chloride: 99 mmol/L (ref 96–106)
Creatinine, Ser: 2.46 mg/dL — ABNORMAL HIGH (ref 0.76–1.27)
Globulin, Total: 2.7 g/dL (ref 1.5–4.5)
Glucose: 159 mg/dL — ABNORMAL HIGH (ref 70–99)
Potassium: 5 mmol/L (ref 3.5–5.2)
Sodium: 139 mmol/L (ref 134–144)
Total Protein: 7.1 g/dL (ref 6.0–8.5)
eGFR: 24 mL/min/{1.73_m2} — ABNORMAL LOW (ref >59)

## 2023-08-11 ENCOUNTER — Encounter: Payer: Self-pay | Admitting: Podiatry

## 2023-08-11 ENCOUNTER — Ambulatory Visit: Payer: Medicare Other | Admitting: Podiatry

## 2023-08-11 VITALS — Ht 64.0 in | Wt 218.0 lb

## 2023-08-11 DIAGNOSIS — L84 Corns and callosities: Secondary | ICD-10-CM | POA: Diagnosis not present

## 2023-08-11 DIAGNOSIS — M79675 Pain in left toe(s): Secondary | ICD-10-CM | POA: Diagnosis not present

## 2023-08-11 DIAGNOSIS — E1142 Type 2 diabetes mellitus with diabetic polyneuropathy: Secondary | ICD-10-CM | POA: Diagnosis not present

## 2023-08-11 DIAGNOSIS — B351 Tinea unguium: Secondary | ICD-10-CM | POA: Diagnosis not present

## 2023-08-11 DIAGNOSIS — M79674 Pain in right toe(s): Secondary | ICD-10-CM

## 2023-08-17 ENCOUNTER — Telehealth: Payer: Self-pay | Admitting: Family Medicine

## 2023-08-17 DIAGNOSIS — Z794 Long term (current) use of insulin: Secondary | ICD-10-CM

## 2023-08-17 DIAGNOSIS — E1169 Type 2 diabetes mellitus with other specified complication: Secondary | ICD-10-CM

## 2023-08-17 NOTE — Progress Notes (Signed)
Remote ICD transmission.   

## 2023-08-17 NOTE — Addendum Note (Signed)
Addended by: Geralyn Flash D on: 08/17/2023 12:50 PM   Modules accepted: Orders

## 2023-08-18 ENCOUNTER — Encounter: Payer: Self-pay | Admitting: Podiatry

## 2023-08-18 MED ORDER — TRULICITY 1.5 MG/0.5ML ~~LOC~~ SOAJ: 1.5000 mg | SUBCUTANEOUS | 5 refills | Status: DC

## 2023-08-18 MED ORDER — BASAGLAR KWIKPEN 100 UNIT/ML ~~LOC~~ SOPN: 24.0000 [IU] | PEN_INJECTOR | Freq: Every day | SUBCUTANEOUS | 5 refills | Status: DC

## 2023-08-18 MED ORDER — INSULIN REGULAR HUMAN 100 UNIT/ML IJ SOLN: 20.0000 [IU] | Freq: Three times a day (TID) | INTRAMUSCULAR | 3 refills | Status: DC

## 2023-08-18 MED ORDER — TRULICITY 1.5 MG/0.5ML ~~LOC~~ SOAJ: 1.5000 mg | SUBCUTANEOUS | 3 refills | Status: DC

## 2023-08-18 NOTE — Telephone Encounter (Signed)
Escribe basaglar and trulicity to [employer]

## 2023-08-18 NOTE — Progress Notes (Signed)
  Subjective:  Patient ID: Manuel Kline, male    DOB: 1932/01/06,  MRN: [id number]  89 y.o. male presents with at risk foot care with history of diabetic neuropathy and painful porokeratotic lesion(s) of both feet and painful mycotic toenails that limit ambulation. Painful toenails interfere with ambulation. Aggravating factors include wearing enclosed shoe gear. Pain is relieved with periodic professional debridement. Painful porokeratotic lesions are aggravated when weightbearing with and without shoegear. Pain is relieved with periodic professional debridement. Chief Complaint  Patient presents with   Nail Problem    Pt is here for [facility], last A1C was 9.2 PCP is Dr Louanne Skye and LOV was last week.    PCP: Dettinger, Elige Radon, MD.  New problem(s): None.   Review of Systems: Negative except as noted in the HPI.   No Known Allergies  Objective:  There were no vitals filed for this visit. Constitutional Patient is a pleasant 89 y.o. male obese in NAD. AAO x 3.  Vascular Capillary fill time to digits <3 seconds.  DP/PT pulse(s) are faintly palpable b/l lower extremities. Pedal hair absent b/l. Lower extremity skin temperature gradient warm to cool b/l. No pain with calf compression b/l. No cyanosis or clubbing noted. No ischemia nor gangrene noted b/l.   Neurologic Protective sensation diminished with 10g monofilament b/l.  Dermatologic Pedal skin is thin, shiny and atrophic b/l.  No open wounds b/l lower extremities. No interdigital macerations b/l lower extremities. Toenails 1-5 b/l elongated, discolored, dystrophic, thickened, crumbly with subungual debris and tenderness to dorsal palpation. Anonychia noted left great toe with dried heme on nailbed. Nailbed(s) epithelialized with no open wound nor signs of infection.  Porokeratotic lesion(s) submet head 5 b/l. No erythema, no edema, no drainage, no fluctuance.  Orthopedic: Normal muscle strength 5/5 to all lower extremity muscle groups  bilaterally. Plantarflexed metatarsal(s) 5th metatarsal head of both feet. HAV with bunion deformity noted b/l LE.   Last HgA1c:     Latest Ref Rng & Units 08/05/2023   11:22 AM 04/29/2023   10:55 AM 01/27/2023   10:23 AM 10/14/2022   10:03 AM  Hemoglobin A1C  Hemoglobin-A1c 4.8 - 5.6 % 9.2  8.0  7.8  7.5      Assessment:   1. Pain due to onychomycosis of toenails of both feet   2. Callus of foot   3. Diabetic peripheral neuropathy (HCC)    Plan:  Patient was evaluated and treated and all questions answered. -Consent given for treatment as described below: -Examined patient. -Continue diabetic shoes daily. -Toenails were debrided in length and girth 2-5 bilaterally and right great toe with sterile nail nippers and dremel without iatrogenic bleeding.  -Porokeratotic lesion(s) submet head 5 b/l pared and enucleated with sterile currette without incident. Total number of lesions debrided=2. -Patient/POA to call should there be question/concern in the interim.  Return in about 3 months (around 11/09/2023).  Freddie Breech, DPM      [facility] LOCATION: [address], [place] [address]                   Office [phone]   [facility] LOCATION: [address] [place], [place] [address] Office [phone]

## 2023-08-18 NOTE — Telephone Encounter (Signed)
Sent those prescriptions to [facility] pharmacy for the patient

## 2023-08-20 ENCOUNTER — Telehealth: Payer: Self-pay

## 2023-08-20 NOTE — Progress Notes (Signed)
Pharmacy Medication Assistance Program Note    09/06/2023  Patient ID: Manuel Kline, male   DOB: 01/30/1932, 89 y.o.   MRN: [id number]     08/20/2023  Outreach Medication One  Manufacturer Medication One [employer] Drugs Trulicity  Type of Assistance Manufacturer Assistance  Date Application Sent to Prescriber 08/20/2023  Date Application Received From Patient 08/17/2023  Application Items Received From Patient Application  Date Application Received From Provider 09/03/2023  Date Application Submitted to Manufacturer 09/03/2023  Patient Assistance Determination Approved  Approval Start Date 09/05/2023  Approval End Date 08/30/2024         08/20/2023  Outreach Medication Two  Manufacturer Medication Two Retail [employer] Drugs Basaglar  Type of Radiographer, therapeutic Assistance  Date Application Sent to Prescriber 08/20/2023  Date Application Received From Patient 08/17/2023  Application Items Received From Patient Application  Date Application Received From Provider 09/03/2023  Date Application Submitted to Manufacturer 09/03/2023  Patient Assistance Determination Approved  Approval Start Date 09/05/2023         08/20/2023  Outreach Medication Three  Manufacturer Medication Three [name] Humulin  Dose of Humulin R  Type of Assistance Manufacturer Assistance  Date Application Sent to Prescriber 08/20/2023  Date Application Received From Patient 08/17/2023  Date Application Received From Provider 09/03/2023  Date Application Submitted to Manufacturer 09/03/2023  Patient Assistance Determination Approved  Approval Start Date 09/05/2023  Approval End Date 08/30/2024

## 2023-08-30 ENCOUNTER — Telehealth: Payer: Self-pay

## 2023-08-30 ENCOUNTER — Telehealth: Payer: Self-pay | Admitting: *Deleted

## 2023-08-30 ENCOUNTER — Ambulatory Visit: Payer: Medicare Other | Admitting: Family Medicine

## 2023-08-30 VITALS — BP 123/71 | HR 80 | Temp 98.1°F | Ht 64.0 in | Wt 222.0 lb

## 2023-08-30 DIAGNOSIS — Z794 Long term (current) use of insulin: Secondary | ICD-10-CM

## 2023-08-30 DIAGNOSIS — E1169 Type 2 diabetes mellitus with other specified complication: Secondary | ICD-10-CM | POA: Diagnosis not present

## 2023-08-30 DIAGNOSIS — J0101 Acute recurrent maxillary sinusitis: Secondary | ICD-10-CM | POA: Diagnosis not present

## 2023-08-30 MED ORDER — BASAGLAR KWIKPEN 100 UNIT/ML ~~LOC~~ SOPN: 26.0000 [IU] | PEN_INJECTOR | Freq: Two times a day (BID) | SUBCUTANEOUS | 5 refills | Status: DC

## 2023-08-30 MED ORDER — AMOXICILLIN 500 MG PO CAPS: 500.0000 mg | ORAL_CAPSULE | Freq: Two times a day (BID) | ORAL | 0 refills | Status: DC

## 2023-08-30 MED ORDER — LANTUS SOLOSTAR 100 UNIT/ML ~~LOC~~ SOPN: 26.0000 [IU] | PEN_INJECTOR | Freq: Two times a day (BID) | SUBCUTANEOUS | 3 refills | Status: DC

## 2023-08-30 NOTE — Telephone Encounter (Signed)
Gave detailed message

## 2023-08-30 NOTE — Telephone Encounter (Signed)
Returned call and answered questions 

## 2023-08-30 NOTE — Telephone Encounter (Signed)
Copied from CRM [id number]. Topic: Clinical - Medical Advice >> Aug 30, 2023  1:41 PM Shelah Lewandowsky wrote: Reason for CRM: wife Manuel Kline called back, patient could not hear the person on phone when they called.  Please call [phone]

## 2023-08-30 NOTE — Telephone Encounter (Signed)
Please let the patient know that I sent Lantus because they said Basaglar brand was not changed, it is exactly the same just by a different brand and the dosing should be exactly the same as well.

## 2023-08-30 NOTE — Addendum Note (Signed)
Addended by: Arville Care on: 08/30/2023 01:01 PM   Modules accepted: Orders

## 2023-08-30 NOTE — Telephone Encounter (Signed)
Fax from [facility] pharmacy RE: Insulin Glargine Insurance prefers Lantus, Toujeo or Evaristo Bury Otherwise Manuel Kline will require a PA

## 2023-08-30 NOTE — Progress Notes (Signed)
BP 123/71   Pulse 80   Temp 98.1 F (36.7 C)   Ht 5\' 4"  (1.626 m)   Wt 222 lb (100.7 kg)   SpO2 98%   BMI 38.11 kg/m    Subjective:   Patient ID: Manuel Kline, male    DOB: 12-Jan-1932, 89 y.o.   MRN: [id number]  HPI: Manuel Kline is a 89 y.o. male presenting on 08/30/2023 for Medical Management of Chronic Issues (Diabetes/trulicity diarrhea X 5 days/Head cold X 3-4 days)   HPI Diarrhea related to Trulicity Patient is coming in for recheck for his diarrhea related to Trulicity.  He had to go back down to Trulicity 1.5 mg because the 3 mg was causing him too much issues and he did good on the first injection but then he took a second injection right before Christmas day and he admits that he ate some bad stuff and then had diarrhea for 4 to 5 days.  He is better now but he did not take his injection yesterday that he usually takes.  His blood sugars been running up in the 2 or 300s in the morning as well.  Patient has been having some cough and congestion and drainage and swelling on the left side of his jaw where the lymph node would be.  He says it has been going on over the past week and is just not getting better.  He says the past 3 or 4 days he has had a lot more congestion.  He denies any shortness of breath or wheezing.  He denies any fevers or chills.  He denies any sick contacts that he knows of.  He says he gets like this typically once a year.  Relevant past medical, surgical, family and social history reviewed and updated as indicated. Interim medical history since our last visit reviewed. Allergies and medications reviewed and updated.  Review of Systems  Constitutional:  Negative for chills and fever.  HENT:  Positive for congestion, postnasal drip, rhinorrhea and sinus pressure. Negative for ear discharge, ear pain, sneezing, sore throat and voice change.   Eyes:  Negative for pain, discharge, redness and visual disturbance.  Respiratory:  Positive for cough. Negative for  shortness of breath and wheezing.   Cardiovascular:  Negative for chest pain and leg swelling.  Musculoskeletal:  Negative for back pain and gait problem.  Skin:  Negative for rash.  Neurological:  Negative for dizziness, weakness and light-headedness.  All other systems reviewed and are negative.   Per HPI unless specifically indicated above   Allergies as of 08/30/2023   No Known Allergies      Medication List        Accurate as of August 30, 2023 10:39 AM. If you have any questions, ask your nurse or doctor.          allopurinol 300 MG tablet Commonly known as: ZYLOPRIM Take 0.5 tablets (150 mg total) by mouth daily. Take 1/2 (one-half) tablet by mouth once daily   amoxicillin 500 MG capsule Commonly known as: AMOXIL Take 1 capsule (500 mg total) by mouth 2 (two) times daily. Started by: Elige Radon Breland Elders   ascorbic acid 500 MG tablet Commonly known as: VITAMIN C Take 500 mg by mouth daily.   B-D UF III MINI PEN NEEDLES 31G X 5 MM Misc Generic drug: Insulin Pen Needle 1 each by Does not apply route 4 (four) times daily.   Basaglar KwikPen 100 UNIT/ML Inject 26 Units into the  skin 2 (two) times daily. What changed:  how much to take when to take this Changed by: Elige Radon Eliyah Mcshea   benazepril 40 MG tablet Commonly known as: LOTENSIN Take 1 tablet (40 mg total) by mouth daily.   clopidogrel 75 MG tablet Commonly known as: PLAVIX Take 1 tablet (75 mg total) by mouth daily.   dapagliflozin propanediol 10 MG Tabs tablet Commonly known as: Farxiga Take 1 tablet (10 mg total) by mouth daily before breakfast.   DULoxetine 60 MG capsule Commonly known as: CYMBALTA Take 1 capsule (60 mg total) by mouth daily.   Fish Oil 1000 MG Cpdr Take 1 tablet by mouth daily.   fluticasone 0.05 % cream Commonly known as: CUTIVATE Apply 1 application. topically daily.   furosemide 40 MG tablet Commonly known as: LASIX Take 1 tablet (40 mg total) by mouth 2  (two) times daily.   gabapentin 300 MG capsule Commonly known as: NEURONTIN Take 1 capsule by mouth twice daily   insulin regular 100 units/mL injection Commonly known as: HumuLIN R Inject 0.2 mLs (20 Units total) into the skin 3 (three) times daily before meals.   Insulin Syringe-Needle U-100 31G X 5/16" 1 ML Misc Commonly known as: RELION INSULIN SYRINGE 1ML/31G USE TO INJECT INSULIN TWICE DAILY AS DIRECTED What changed:  how much to take how to take this when to take this   metoprolol succinate 50 MG 24 hr tablet Commonly known as: TOPROL-XL TAKE 1 TABLET BY MOUTH ONCE DAILY WITH OR IMMEDIATELY FOLLOWING A MEAL What changed:  how much to take how to take this when to take this   MULTIVITAMIN ADULT PO Take 1 tablet by mouth daily.   nitroGLYCERIN 0.4 MG SL tablet Commonly known as: NITROSTAT Place 1 tablet (0.4 mg total) under the tongue every 5 (five) minutes as needed for chest pain.   polyethylene glycol powder 17 GM/SCOOP powder Commonly known as: GLYCOLAX/MIRALAX Take 17 g by mouth 2 (two) times daily as needed. What changed: reasons to take this   rosuvastatin 5 MG tablet Commonly known as: CRESTOR Take 1 tablet (5 mg total) by mouth daily.   tamsulosin 0.4 MG Caps capsule Commonly known as: FLOMAX Take 1 capsule (0.4 mg total) by mouth daily.   Trulicity 1.5 MG/0.5ML Soaj Generic drug: Dulaglutide Inject 1.5 mg into the skin once a week.   VITAMIN B 12 PO Take 1,000 mcg by mouth daily.         Objective:   BP 123/71   Pulse 80   Temp 98.1 F (36.7 C)   Ht 5\' 4"  (1.626 m)   Wt 222 lb (100.7 kg)   SpO2 98%   BMI 38.11 kg/m   Wt Readings from Last 3 Encounters:  08/30/23 222 lb (100.7 kg)  08/11/23 218 lb (98.9 kg)  08/05/23 218 lb (98.9 kg)    Physical Exam Vitals and nursing note reviewed.  Constitutional:      General: He is not in acute distress.    Appearance: He is well-developed. He is not diaphoretic.  HENT:     Right Ear:  Tympanic membrane, ear canal and external ear normal.     Left Ear: Tympanic membrane, ear canal and external ear normal.     Nose: Mucosal edema present. No rhinorrhea.     Right Sinus: Maxillary sinus tenderness present. No frontal sinus tenderness.     Left Sinus: Maxillary sinus tenderness present. No frontal sinus tenderness.     Mouth/Throat:  Pharynx: Uvula midline. No oropharyngeal exudate or posterior oropharyngeal erythema.     Tonsils: No tonsillar abscesses.  Eyes:     General: No scleral icterus.    Conjunctiva/sclera: Conjunctivae normal.  Neck:     Thyroid: No thyromegaly.  Cardiovascular:     Rate and Rhythm: Normal rate and regular rhythm.     Heart sounds: Normal heart sounds. No murmur heard. Pulmonary:     Effort: Pulmonary effort is normal. No respiratory distress.     Breath sounds: Normal breath sounds. No wheezing or rales.  Musculoskeletal:        General: Normal range of motion.     Cervical back: Neck supple.  Lymphadenopathy:     Head:     Left side of head: Submandibular (Mobile, slightly tender enlarged) adenopathy present.     Cervical: No cervical adenopathy.  Skin:    General: Skin is warm and dry.     Findings: No rash.  Neurological:     Mental Status: He is alert and oriented to person, place, and time.     Coordination: Coordination normal.  Psychiatric:        Behavior: Behavior normal.       Assessment & Plan:   Problem List Items Addressed This Visit       Endocrine   Type 2 diabetes mellitus with other specified complication (HCC) - Primary   Relevant Medications   Insulin Glargine (BASAGLAR KWIKPEN) 100 UNIT/ML   Other Visit Diagnoses       Acute recurrent maxillary sinusitis       Relevant Medications   amoxicillin (AMOXIL) 500 MG capsule     Will treat for possible sinus infection with amoxicillin due to sinus pressure and congestion and lymphadenopathy.  Increase the Basaglar to 26 units twice a day from 24  units twice a day  Pulm and wait 1 week and then try the Trulicity again at the lower dose 1.5 mg.  Follow up plan: Return if symptoms worsen or fail to improve.  Counseling provided for all of the vaccine components No orders of the defined types were placed in this encounter.   Arville Care, MD [facility] 08/30/2023, 10:39 AM

## 2023-09-06 NOTE — Telephone Encounter (Signed)
 All approved.   Please e-scribe meds to [facility] for renewal.   Also, will patient discontinue basaglar PAP due to change to Lantus?

## 2023-09-08 MED ORDER — BASAGLAR KWIKPEN 100 UNIT/ML ~~LOC~~ SOPN: 26.0000 [IU] | PEN_INJECTOR | Freq: Two times a day (BID) | SUBCUTANEOUS | 3 refills | Status: AC

## 2023-09-08 NOTE — Telephone Encounter (Signed)
 Geographical information systems officer for the patient to [facility]

## 2023-09-22 DIAGNOSIS — E119 Type 2 diabetes mellitus without complications: Secondary | ICD-10-CM | POA: Diagnosis not present

## 2023-10-03 ENCOUNTER — Other Ambulatory Visit: Payer: Self-pay | Admitting: Family Medicine

## 2023-10-14 ENCOUNTER — Other Ambulatory Visit: Payer: Self-pay | Admitting: Family Medicine

## 2023-10-14 DIAGNOSIS — E1142 Type 2 diabetes mellitus with diabetic polyneuropathy: Secondary | ICD-10-CM

## 2023-11-03 NOTE — Progress Notes (Unsigned)
  Electrophysiology Office Note:   ID:  Trino, Higinbotham May 17, 1932, MRN [id number]  Primary Cardiologist: Gypsy Balsam, MD Electrophysiologist: Lewayne Bunting, MD  {Click to update primary MD,subspecialty MD or APP then REFRESH:1}    History of Present Illness:   Manuel Kline is a 89 y.o. male with h/o HTN, CAD s/p CABG, OSA, and VT s/p ICD insertion seen today for routine electrophysiology followup.   Since last being seen in our clinic the patient reports doing ***.  he denies chest pain, palpitations, dyspnea, PND, orthopnea, nausea, vomiting, dizziness, syncope, edema, weight gain, or early satiety.   Review of systems complete and found to be negative unless listed in HPI.   EP Information / Studies Reviewed:    EKG is ordered today. Personal review as below.       ICD Interrogation-  reviewed in detail today,  See PACEART report.  Arrhythmia/Device History Medtronic Dual Chamber ICD 2003, [place] change 2017   Physical Exam:   VS:  There were no vitals taken for this visit.   Wt Readings from Last 3 Encounters:  08/30/23 222 lb (100.7 kg)  08/11/23 218 lb (98.9 kg)  08/05/23 218 lb (98.9 kg)     GEN: No acute distress *** NECK: No JVD; No carotid bruits CARDIAC: {EPRHYTHM:28826}, no murmurs, rubs, gallops RESPIRATORY:  Clear to auscultation without rales, wheezing or rhonchi  ABDOMEN: Soft, non-tender, non-distended EXTREMITIES:  {EDEMA LEVEL:28147::"No"} edema; No deformity   ASSESSMENT AND PLAN:    Ventricular arrhythmia  s/p Medtronic dual chamber ICD  euvolemic today Stable on an appropriate medical regimen Normal ICD function See Pace Art report No changes today  CAD s/p CABG Denies s/s ischemia  OSA  Encouraged nightly CPAP   VT Quiescent by device *** Continue toprol 50 mg daily  Disposition:   Follow up with {EPPROVIDERS:28135} {EPFOLLOW UP:28173}   Signed, Graciella Freer, PA-C

## 2023-11-04 ENCOUNTER — Encounter: Payer: Self-pay | Admitting: Student

## 2023-11-04 ENCOUNTER — Encounter: Payer: Self-pay | Admitting: Family Medicine

## 2023-11-04 ENCOUNTER — Ambulatory Visit: Payer: Medicare Other | Admitting: Family Medicine

## 2023-11-04 ENCOUNTER — Ambulatory Visit: Payer: Medicare Other | Attending: Student | Admitting: Student

## 2023-11-04 VITALS — BP 129/75 | HR 82 | Ht 64.0 in | Wt 224.0 lb

## 2023-11-04 VITALS — BP 120/64 | HR 64 | Ht 64.0 in | Wt 221.0 lb

## 2023-11-04 DIAGNOSIS — I10 Essential (primary) hypertension: Secondary | ICD-10-CM

## 2023-11-04 DIAGNOSIS — E782 Mixed hyperlipidemia: Secondary | ICD-10-CM

## 2023-11-04 DIAGNOSIS — Z794 Long term (current) use of insulin: Secondary | ICD-10-CM

## 2023-11-04 DIAGNOSIS — K118 Other diseases of salivary glands: Secondary | ICD-10-CM

## 2023-11-04 DIAGNOSIS — E113393 Type 2 diabetes mellitus with moderate nonproliferative diabetic retinopathy without macular edema, bilateral: Secondary | ICD-10-CM

## 2023-11-04 DIAGNOSIS — Z7984 Long term (current) use of oral hypoglycemic drugs: Secondary | ICD-10-CM

## 2023-11-04 DIAGNOSIS — N401 Enlarged prostate with lower urinary tract symptoms: Secondary | ICD-10-CM

## 2023-11-04 DIAGNOSIS — I4729 Other ventricular tachycardia: Secondary | ICD-10-CM

## 2023-11-04 DIAGNOSIS — I255 Ischemic cardiomyopathy: Secondary | ICD-10-CM

## 2023-11-04 DIAGNOSIS — E119 Type 2 diabetes mellitus without complications: Secondary | ICD-10-CM

## 2023-11-04 DIAGNOSIS — I5022 Chronic systolic (congestive) heart failure: Secondary | ICD-10-CM

## 2023-11-04 DIAGNOSIS — Z951 Presence of aortocoronary bypass graft: Secondary | ICD-10-CM

## 2023-11-04 DIAGNOSIS — E1142 Type 2 diabetes mellitus with diabetic polyneuropathy: Secondary | ICD-10-CM | POA: Diagnosis not present

## 2023-11-04 DIAGNOSIS — I472 Ventricular tachycardia, unspecified: Secondary | ICD-10-CM

## 2023-11-04 DIAGNOSIS — E1169 Type 2 diabetes mellitus with other specified complication: Secondary | ICD-10-CM | POA: Diagnosis not present

## 2023-11-04 DIAGNOSIS — N184 Chronic kidney disease, stage 4 (severe): Secondary | ICD-10-CM

## 2023-11-04 DIAGNOSIS — R3914 Feeling of incomplete bladder emptying: Secondary | ICD-10-CM | POA: Diagnosis not present

## 2023-11-04 LAB — CUP PACEART INCLINIC DEVICE CHECK
Battery Remaining Longevity: 31 mo
Battery Voltage: 2.94 V
Brady Statistic AP VP Percent: 0.29 %
Brady Statistic AP VS Percent: 82.26 %
Brady Statistic AS VP Percent: 0.02 %
Brady Statistic AS VS Percent: 17.43 %
Brady Statistic RA Percent Paced: 82.41 %
Brady Statistic RV Percent Paced: 0.26 %
Date Time Interrogation Session: 20250306125154
HighPow Impedance: 52 Ohm
HighPow Impedance: 74 Ohm
Implantable Lead Connection Status: 753985
Implantable Lead Connection Status: 753985
Implantable Lead Implant Date: 20030519
Implantable Lead Implant Date: 20030519
Implantable Lead Location: 753859
Implantable Lead Location: 753860
Implantable Lead Model: 158
Implantable Lead Model: 4087
Implantable Lead Serial Number: 115102
Implantable Lead Serial Number: 159999
Implantable Pulse Generator Implant Date: 20171114
Lead Channel Impedance Value: 399 Ohm
Lead Channel Impedance Value: 399 Ohm
Lead Channel Impedance Value: 4047 Ohm
Lead Channel Impedance Value: 4047 Ohm
Lead Channel Impedance Value: 4047 Ohm
Lead Channel Impedance Value: 437 Ohm
Lead Channel Pacing Threshold Amplitude: 1.25 V
Lead Channel Pacing Threshold Amplitude: 1.375 V
Lead Channel Pacing Threshold Pulse Width: 0.4 ms
Lead Channel Pacing Threshold Pulse Width: 0.4 ms
Lead Channel Sensing Intrinsic Amplitude: 10.5 mV
Lead Channel Sensing Intrinsic Amplitude: 12.375 mV
Lead Channel Sensing Intrinsic Amplitude: 2 mV
Lead Channel Sensing Intrinsic Amplitude: 2.25 mV
Lead Channel Setting Pacing Amplitude: 2 V
Lead Channel Setting Pacing Amplitude: 2.5 V
Lead Channel Setting Pacing Pulse Width: 0.4 ms
Lead Channel Setting Sensing Sensitivity: 0.3 mV
Zone Setting Status: 755011

## 2023-11-04 LAB — BAYER DCA HB A1C WAIVED: HB A1C (BAYER DCA - WAIVED): 7.3 % — ABNORMAL HIGH (ref 4.8–5.6)

## 2023-11-04 NOTE — Progress Notes (Signed)
 BP 129/75   Pulse 82   Ht 5\' 4"  (1.626 m)   Wt 224 lb (101.6 kg)   SpO2 97%   BMI 38.45 kg/m    Subjective:   Patient ID: Manuel Kline, male    DOB: 09-01-1931, 89 y.o.   MRN: [id number]  HPI: Manuel Kline is a 89 y.o. male presenting on 11/04/2023 for Medical Management of Chronic Issues and Diabetes   HPI  Type 2 diabetes mellitus Patient comes in today for recheck of his diabetes. Patient has been currently taking [place] [facility] doctor. Patient is currently on an ACE inhibitor/ARB. Patient has not seen an ophthalmologist this year. Patient denies any new issues with their feet. The symptom started onset as an adult hypertension and hyperlipidemia and CHF and CKD and diabetic retinopathy and neuropathy ARE RELATED TO DM   Hypertension and CHF Patient is currently on benazepril and furosemide and metoprolol, and their blood pressure today is 129/75. Patient denies any lightheadedness or dizziness. Patient denies headaches, blurred vision, chest pains, shortness of breath, or weakness. Denies any side effects from medication and is content with current medication.   Hyperlipidemia Patient is coming in for recheck of his hyperlipidemia. The patient is currently taking Crestor and fish oils. They deny any issues with myalgias or history of liver damage from it. They deny any focal numbness or weakness or chest pain.   Relevant past medical, surgical, family and social history reviewed and updated as indicated. Interim medical history since our last visit reviewed. Allergies and medications reviewed and updated.  Review of Systems  Constitutional:  Negative for chills and fever.  Eyes:  Negative for visual disturbance.  Respiratory:  Negative for shortness of breath and wheezing.   Cardiovascular:  Negative for chest pain and leg swelling.  Musculoskeletal:  Negative for back pain and gait problem.  Skin:  Negative for rash.  Neurological:  Negative for dizziness and light-headedness.   All other systems reviewed and are negative.   Per HPI unless specifically indicated above   Allergies as of 11/04/2023   No Known Allergies      Medication List        Accurate as of November 04, 2023 10:30 AM. If you have any questions, ask your nurse or doctor.          allopurinol 300 MG tablet Commonly known as: ZYLOPRIM Take 0.5 tablets (150 mg total) by mouth daily. Take 1/2 (one-half) tablet by mouth once daily   amoxicillin 500 MG capsule Commonly known as: AMOXIL Take 1 capsule (500 mg total) by mouth 2 (two) times daily.   ascorbic acid 500 MG tablet Commonly known as: VITAMIN C Take 500 mg by mouth daily.   B-D UF III MINI PEN NEEDLES 31G X 5 MM Misc Generic drug: Insulin Pen Needle 1 each by Does not apply route 4 (four) times daily.   Basaglar KwikPen 100 UNIT/ML Inject 26 Units into the skin 2 (two) times daily.   benazepril 40 MG tablet Commonly known as: LOTENSIN Take 1 tablet (40 mg total) by mouth daily.   clopidogrel 75 MG tablet Commonly known as: PLAVIX Take 1 tablet (75 mg total) by mouth daily.   dapagliflozin propanediol 10 MG Tabs tablet Commonly known as: Farxiga Take 1 tablet (10 mg total) by mouth daily before breakfast.   DULoxetine 60 MG capsule Commonly known as: CYMBALTA Take 1 capsule by mouth once daily   Fish Oil 1000 MG Cpdr Take 1 tablet  by mouth daily.   fluticasone 0.05 % cream Commonly known as: CUTIVATE Apply 1 application. topically daily.   furosemide 40 MG tablet Commonly known as: LASIX Take 1 tablet (40 mg total) by mouth 2 (two) times daily.   gabapentin 300 MG capsule Commonly known as: NEURONTIN Take 1 capsule by mouth twice daily   insulin regular 100 units/mL injection Commonly known as: HumuLIN R Inject 0.2 mLs (20 Units total) into the skin 3 (three) times daily before meals.   Insulin Syringe-Needle U-100 31G X 5/16" 1 ML Misc Commonly known as: RELION INSULIN SYRINGE 1ML/31G USE TO INJECT  INSULIN TWICE DAILY AS DIRECTED What changed:  how much to take how to take this when to take this   metoprolol succinate 50 MG 24 hr tablet Commonly known as: TOPROL-XL TAKE 1 TABLET BY MOUTH ONCE DAILY WITH OR IMMEDIATELY FOLLOWING A MEAL   MULTIVITAMIN ADULT PO Take 1 tablet by mouth daily.   nitroGLYCERIN 0.4 MG SL tablet Commonly known as: NITROSTAT Place 1 tablet (0.4 mg total) under the tongue every 5 (five) minutes as needed for chest pain.   polyethylene glycol powder 17 GM/SCOOP powder Commonly known as: GLYCOLAX/MIRALAX Take 17 g by mouth 2 (two) times daily as needed. What changed: reasons to take this   rosuvastatin 5 MG tablet Commonly known as: CRESTOR Take 1 tablet (5 mg total) by mouth daily.   tamsulosin 0.4 MG Caps capsule Commonly known as: FLOMAX Take 1 capsule (0.4 mg total) by mouth daily.   Trulicity 1.5 MG/0.5ML Soaj Generic drug: Dulaglutide Inject 1.5 mg into the skin once a week.   VITAMIN B 12 PO Take 1,000 mcg by mouth daily.         Objective:   BP 129/75   Pulse 82   Ht 5\' 4"  (1.626 m)   Wt 224 lb (101.6 kg)   SpO2 97%   BMI 38.45 kg/m   Wt Readings from Last 3 Encounters:  11/04/23 224 lb (101.6 kg)  08/30/23 222 lb (100.7 kg)  08/11/23 218 lb (98.9 kg)    Physical Exam Vitals and nursing note reviewed.  Constitutional:      General: He is not in acute distress.    Appearance: He is well-developed. He is not diaphoretic.  Eyes:     General: No scleral icterus.    Conjunctiva/sclera: Conjunctivae normal.  Neck:     Thyroid: No thyromegaly.  Cardiovascular:     Rate and Rhythm: Normal rate and regular rhythm.     Heart sounds: Normal heart sounds. No murmur heard. Pulmonary:     Effort: Pulmonary effort is normal. No respiratory distress.     Breath sounds: Normal breath sounds. No wheezing.  Musculoskeletal:        General: No swelling. Normal range of motion.     Cervical back: Neck supple.  Lymphadenopathy:      Cervical: No cervical adenopathy.  Skin:    General: Skin is warm and dry.     Findings: No rash.  Neurological:     Mental Status: He is alert and oriented to person, place, and time.     Coordination: Coordination normal.  Psychiatric:        Behavior: Behavior normal.       Assessment & Plan:   Problem List Items Addressed This Visit       Cardiovascular and Mediastinum   Chronic systolic heart failure (HCC) (Chronic)   Essential hypertension     Endocrine  Type 2 diabetes mellitus with other specified complication (HCC) - Primary   Relevant Orders   CBC with Differential/Platelet   CMP14+EGFR   Lipid panel   Bayer DCA Hb A1c Waived   Diabetic peripheral neuropathy (HCC)   Moderate nonproliferative diabetic retinopathy of both eyes without macular edema associated with type 2 diabetes mellitus (HCC)   Diabetes mellitus treated with insulin and oral medication (HCC)     Genitourinary   CKD (chronic kidney disease) stage 4, GFR 15-29 ml/min (HCC)   Relevant Orders   CBC with Differential/Platelet   CMP14+EGFR   Lipid panel   Bayer DCA Hb A1c Waived   BPH (benign prostatic hyperplasia)   Relevant Orders   PSA, total and free     Other   Hyperlipidemia (Chronic)    A1c 7.3.  Blood pressures and weights look pretty good and blood sugars are a lot better.  No changes.  Will see how the rest of his blood work looks when it results. Follow up plan: Return in about 3 months (around 02/04/2024), or if symptoms worsen or fail to improve, for Diabetes and hypertension and cholesterol.  Counseling provided for all of the vaccine components Orders Placed This Encounter  Procedures   CBC with Differential/Platelet   CMP14+EGFR   Lipid panel   Bayer DCA Hb A1c Waived   PSA, total and free    Arville Care, MD [facility] 11/04/2023, 10:30 AM

## 2023-11-04 NOTE — Addendum Note (Signed)
 Addended by: Arville Care on: 11/04/2023 10:36 AM   Modules accepted: Orders

## 2023-11-04 NOTE — Patient Instructions (Signed)
 Medication Instructions:  Your physician recommends that you continue on your current medications as directed. Please refer to the Current Medication list given to you today.  *If you need a refill on your cardiac medications before your next appointment, please call your pharmacy*  Lab Work: None ordered If you have labs (blood work) drawn today and your tests are completely normal, you will receive your results only by: MyChart Message (if you have MyChart) OR A paper copy in the mail If you have any lab test that is abnormal or we need to change your treatment, we will call you to review the results.  Follow-Up: At [facility], you and your health needs are our priority.  As part of our continuing mission to provide you with exceptional heart care, we have created designated Provider Care Teams.  These Care Teams include your primary Cardiologist (physician) and Advanced Practice Providers (APPs -  Physician Assistants and Nurse Practitioners) who all work together to provide you with the care you need, when you need it.  Your next appointment:   1 year(s)  Provider:   Casimiro Needle "Otilio Saber, PA-C

## 2023-11-05 LAB — CMP14+EGFR
ALT: 17 IU/L (ref 0–44)
AST: 25 IU/L (ref 0–40)
Albumin: 4.3 g/dL (ref 3.7–4.7)
Alkaline Phosphatase: 119 IU/L (ref 44–121)
BUN/Creatinine Ratio: 22 (ref 10–24)
BUN: 53 mg/dL — ABNORMAL HIGH (ref 8–27)
Bilirubin Total: 0.5 mg/dL (ref 0.0–1.2)
CO2: 23 mmol/L (ref 20–29)
Calcium: 9.3 mg/dL (ref 8.6–10.2)
Chloride: 101 mmol/L (ref 96–106)
Creatinine, Ser: 2.39 mg/dL — ABNORMAL HIGH (ref 0.76–1.27)
Globulin, Total: 2.5 g/dL (ref 1.5–4.5)
Glucose: 147 mg/dL — ABNORMAL HIGH (ref 70–99)
Potassium: 4.9 mmol/L (ref 3.5–5.2)
Sodium: 138 mmol/L (ref 134–144)
Total Protein: 6.8 g/dL (ref 6.0–8.5)
eGFR: 25 mL/min/{1.73_m2} — ABNORMAL LOW (ref >59)

## 2023-11-05 LAB — CBC WITH DIFFERENTIAL/PLATELET
Basophils Absolute: 0 10*3/uL (ref 0.0–0.2)
Basos: 0 %
EOS (ABSOLUTE): 0.2 10*3/uL (ref 0.0–0.4)
Eos: 3 %
Hematocrit: 38.7 % (ref 37.5–51.0)
Hemoglobin: 12.9 g/dL — ABNORMAL LOW (ref 13.0–17.7)
Immature Grans (Abs): 0 10*3/uL (ref 0.0–0.1)
Immature Granulocytes: 0 %
Lymphocytes Absolute: 2 10*3/uL (ref 0.7–3.1)
Lymphs: 27 %
MCH: 29.4 pg (ref 26.6–33.0)
MCHC: 33.3 g/dL (ref 31.5–35.7)
MCV: 88 fL (ref 79–97)
Monocytes Absolute: 0.5 10*3/uL (ref 0.1–0.9)
Monocytes: 7 %
Neutrophils Absolute: 4.6 10*3/uL (ref 1.4–7.0)
Neutrophils: 63 %
Platelets: 167 10*3/uL (ref 150–450)
RBC: 4.39 x10E6/uL (ref 4.14–5.80)
RDW: 14.7 % (ref 11.6–15.4)
WBC: 7.4 10*3/uL (ref 3.4–10.8)

## 2023-11-05 LAB — PSA, TOTAL AND FREE
PSA, Free Pct: 11.5 %
PSA, Free: 0.15 ng/mL
Prostate Specific Ag, Serum: 1.3 ng/mL (ref 0.0–4.0)

## 2023-11-05 LAB — LIPID PANEL
Chol/HDL Ratio: 3.1 ratio (ref 0.0–5.0)
Cholesterol, Total: 124 mg/dL (ref 100–199)
HDL: 40 mg/dL (ref >39)
LDL Chol Calc (NIH): 59 mg/dL (ref 0–99)
Triglycerides: 146 mg/dL (ref 0–149)
VLDL Cholesterol Cal: 25 mg/dL (ref 5–40)

## 2023-11-08 ENCOUNTER — Encounter: Payer: Self-pay | Admitting: Family Medicine

## 2023-11-16 ENCOUNTER — Other Ambulatory Visit (HOSPITAL_COMMUNITY)

## 2023-11-17 ENCOUNTER — Ambulatory Visit (HOSPITAL_COMMUNITY)

## 2023-11-17 DIAGNOSIS — N184 Chronic kidney disease, stage 4 (severe): Secondary | ICD-10-CM | POA: Diagnosis not present

## 2023-11-19 ENCOUNTER — Ambulatory Visit (HOSPITAL_COMMUNITY)
Admission: RE | Admit: 2023-11-19 | Discharge: 2023-11-19 | Disposition: A | Discharge status: Home / Self Care | Source: Ambulatory Visit | Attending: Family Medicine | Admitting: Family Medicine

## 2023-11-19 DIAGNOSIS — K118 Other diseases of salivary glands: Secondary | ICD-10-CM | POA: Insufficient documentation

## 2023-11-19 DIAGNOSIS — R590 Localized enlarged lymph nodes: Secondary | ICD-10-CM | POA: Diagnosis not present

## 2023-11-22 ENCOUNTER — Other Ambulatory Visit: Payer: Self-pay

## 2023-11-22 ENCOUNTER — Encounter: Payer: Self-pay | Admitting: Family Medicine

## 2023-11-22 ENCOUNTER — Other Ambulatory Visit: Payer: Self-pay | Admitting: Family Medicine

## 2023-11-22 ENCOUNTER — Ambulatory Visit (HOSPITAL_COMMUNITY)
Admission: RE | Admit: 2023-11-22 | Discharge: 2023-11-22 | Disposition: A | Discharge status: Home / Self Care | Source: Ambulatory Visit | Attending: Family Medicine | Admitting: Family Medicine

## 2023-11-22 DIAGNOSIS — K118 Other diseases of salivary glands: Secondary | ICD-10-CM

## 2023-11-22 DIAGNOSIS — R221 Localized swelling, mass and lump, neck: Secondary | ICD-10-CM | POA: Diagnosis not present

## 2023-11-22 DIAGNOSIS — I672 Cerebral atherosclerosis: Secondary | ICD-10-CM | POA: Diagnosis not present

## 2023-11-22 DIAGNOSIS — I6523 Occlusion and stenosis of bilateral carotid arteries: Secondary | ICD-10-CM | POA: Diagnosis not present

## 2023-11-22 LAB — POCT I-STAT CREATININE: Creatinine, Ser: 2.3 mg/dL — ABNORMAL HIGH (ref 0.61–1.24)

## 2023-11-25 ENCOUNTER — Encounter: Payer: Self-pay | Admitting: Family Medicine

## 2023-11-25 ENCOUNTER — Telehealth: Payer: Self-pay | Admitting: Pharmacist

## 2023-11-25 ENCOUNTER — Other Ambulatory Visit: Payer: Self-pay | Admitting: Family Medicine

## 2023-11-25 DIAGNOSIS — I10 Essential (primary) hypertension: Secondary | ICD-10-CM

## 2023-11-25 DIAGNOSIS — Z794 Long term (current) use of insulin: Secondary | ICD-10-CM

## 2023-11-25 DIAGNOSIS — E1142 Type 2 diabetes mellitus with diabetic polyneuropathy: Secondary | ICD-10-CM

## 2023-11-25 DIAGNOSIS — E782 Mixed hyperlipidemia: Secondary | ICD-10-CM

## 2023-11-25 DIAGNOSIS — N184 Chronic kidney disease, stage 4 (severe): Secondary | ICD-10-CM

## 2023-11-25 MED ORDER — DAPAGLIFLOZIN PROPANEDIOL 10 MG PO TABS: 10.0000 mg | ORAL_TABLET | Freq: Every day | ORAL | 5 refills | Status: DC

## 2023-11-25 NOTE — Telephone Encounter (Signed)
   Patient [place] for [place].  Updated RX escribed to [facility] mail order (pharmacy for [place] patient assistance).  Patient is stable on current regimen.    Kieth Brightly, PharmD, BCACP, CPP Clinical Pharmacist, [facility]

## 2023-12-14 ENCOUNTER — Ambulatory Visit: Payer: Medicare Other | Admitting: Podiatry

## 2023-12-14 ENCOUNTER — Encounter: Payer: Self-pay | Admitting: Podiatry

## 2023-12-14 VITALS — Ht 64.0 in | Wt 221.0 lb

## 2023-12-14 DIAGNOSIS — Q828 Other specified congenital malformations of skin: Secondary | ICD-10-CM

## 2023-12-14 DIAGNOSIS — E1142 Type 2 diabetes mellitus with diabetic polyneuropathy: Secondary | ICD-10-CM

## 2023-12-14 DIAGNOSIS — M79675 Pain in left toe(s): Secondary | ICD-10-CM

## 2023-12-14 DIAGNOSIS — B351 Tinea unguium: Secondary | ICD-10-CM

## 2023-12-14 DIAGNOSIS — M79674 Pain in right toe(s): Secondary | ICD-10-CM

## 2023-12-14 NOTE — Progress Notes (Signed)
  Subjective:  Patient ID: Manuel Kline, male    DOB: [birth year],  MRN: [id number]  Manuel Kline presents to clinic today for at risk foot care with history of diabetic neuropathy and callus(es) of both feet and painful mycotic toenails that are difficult to trim. Painful toenails interfere with ambulation. Aggravating factors include wearing enclosed shoe gear. Pain is relieved with periodic professional debridement. Painful calluses are aggravated when weightbearing with and without shoegear. Pain is relieved with periodic professional debridement.  Chief Complaint  Patient presents with   Nail Problem    Pt is here for [facility] last A1C was 7.3 PCP is Dr Steen Eden and LOV was in March.   New problem(s): None.   PCP is Dettinger, Lucio Sabin, MD.  No Known Allergies  Review of Systems: Negative except as noted in the HPI.  Objective: No changes noted in today's physical examination. There were no vitals filed for this visit. Manuel Kline is a pleasant 89 y.o. male obese in NAD. AAO x 3.  Vascular Examination: CFT <3 seconds b/l. DP/PT pulses faintly palpable b/l. Skin temperature gradient warm to warm b/l. No pain with calf compression. No ischemia or gangrene. No cyanosis or clubbing noted b/l.    Neurological Examination: Protective sensation diminished with 10g monofilament b/l.  Dermatological Examination: Pedal skin warm and supple b/l.   No open wounds. No interdigital macerations.  Toenails 1-5 b/l thick, discolored, elongated with subungual debris and pain on dorsal palpation.    Pedal integument with normal turgor, texture and tone b/l LE. No open wounds b/l. No interdigital macerations b/l. Toenails 1-5 b/l elongated, thickened, discolored with subungual debris. +Tenderness with dorsal palpation of nailplates. Hyperkeratotic lesion(s) noted submet head 5 b/l.  Musculoskeletal Examination: Muscle strength 5/5 to all lower extremity muscle groups bilaterally. Plantarflexed  metatarsal(s) 5th metatarsal head b/l feet. HAV with bunion deformity noted b/l LE.  Radiographs: None  Last A1c:      Latest Ref Rng & Units 11/04/2023    9:57 AM 08/05/2023   11:22 AM 04/29/2023   10:55 AM 01/27/2023   10:23 AM  Hemoglobin A1C  Hemoglobin-A1c 4.8 - 5.6 % 7.3  9.2  8.0  7.8     Assessment/Plan: 1. Pain due to onychomycosis of toenails of both feet   2. Porokeratosis   3. Diabetic peripheral neuropathy [facility])   Patient was evaluated and treated. All patient's and/or POA's questions/concerns addressed on today's visit. Mycotic toenails 1-5 debrided in length and girth without incident. Calluses submet head 5 b/l pared with sharp debridement without incident. Continue daily foot inspections and monitor blood glucose per PCP/Endocrinologist's recommendations. Continue soft, supportive shoe gear daily. Report any pedal injuries to medical professional. Call office if there are any questions/concerns. -Patient/POA to call should there be question/concern in the interim.   Return in about 3 months (around 03/14/2024).  Luella Sager, DPM      [facility] LOCATION: [address], [place] [address]                   Office [phone]   [facility] LOCATION: [address] [place], [place] [address] Office [phone]

## 2023-12-15 ENCOUNTER — Ambulatory Visit: Payer: Medicare Other

## 2023-12-15 ENCOUNTER — Encounter (INDEPENDENT_AMBULATORY_CARE_PROVIDER_SITE_OTHER): Payer: Self-pay | Admitting: Otolaryngology

## 2023-12-15 ENCOUNTER — Ambulatory Visit (INDEPENDENT_AMBULATORY_CARE_PROVIDER_SITE_OTHER): Admitting: Otolaryngology

## 2023-12-15 VITALS — BP 108/61 | HR 74

## 2023-12-15 VITALS — Ht 64.0 in | Wt 221.0 lb

## 2023-12-15 DIAGNOSIS — H9190 Unspecified hearing loss, unspecified ear: Secondary | ICD-10-CM

## 2023-12-15 DIAGNOSIS — K118 Other diseases of salivary glands: Secondary | ICD-10-CM

## 2023-12-15 DIAGNOSIS — Z0000 Encounter for general adult medical examination without abnormal findings: Secondary | ICD-10-CM

## 2023-12-15 DIAGNOSIS — H903 Sensorineural hearing loss, bilateral: Secondary | ICD-10-CM

## 2023-12-15 NOTE — Progress Notes (Signed)
 Subjective:   Manuel Kline is a 89 y.o. who presents for a Medicare Wellness preventive visit.  Visit Complete: Virtual I connected with  Manuel Kline on 12/15/23 by a audio enabled telemedicine application and verified that I am speaking with the correct person using two identifiers.  Patient Location: Home  Provider Location: Home Office  I discussed the limitations of evaluation and management by telemedicine. The patient expressed understanding and agreed to proceed.  Vital Signs: Because this visit was a virtual/telehealth visit, some criteria may be missing or patient reported. Any vitals not documented were not able to be obtained and vitals that have been documented are patient reported.  VideoDeclined- This patient declined Librarian, academic. Therefore the visit was completed with audio only.  Persons Participating in Visit: Patient.  AWV Questionnaire: Yes: Patient Medicare AWV questionnaire was completed by the patient on 12/14/23; I have confirmed that all information answered by patient is correct and no changes since this date.  Cardiac Risk Factors include: advanced age (>26men, >43 women);diabetes mellitus;dyslipidemia;hypertension     Objective:    Today's Vitals   12/15/23 0915  Weight: 221 lb (100.2 kg)  Height: 5\' 4"  (1.626 m)   Body mass index is 37.93 kg/m.     12/15/2023    9:20 AM 12/14/2022    8:59 AM 07/21/2022    8:07 PM 12/10/2021    9:24 AM 12/09/2020    9:04 AM 10/24/2019   11:25 AM 04/20/2019    9:55 AM  Advanced Directives  Does Patient Have a Medical Advance Directive? No Yes No No Yes Yes Yes  Type of Furniture conservator/restorer;Living will    Healthcare Power of [place];Living will Healthcare Power of [place];Living will  Does patient want to make changes to medical advance directive?     Yes (MAU/Ambulatory/Procedural Areas - Information given) No - Patient declined No - Patient declined   Copy of Healthcare Power of Attorney in Chart?  No - copy requested       Would patient like information on creating a medical advance directive? Yes (MAU/Ambulatory/Procedural Areas - Information given)   Yes (MAU/Ambulatory/Procedural Areas - Information given)       Current Medications (verified) Outpatient Encounter Medications as of 12/15/2023  Medication Sig   allopurinol (ZYLOPRIM) 300 MG tablet Take 0.5 tablets (150 mg total) by mouth daily. Take 1/2 (one-half) tablet by mouth once daily   benazepril (LOTENSIN) 40 MG tablet Take 1 tablet by mouth once daily   clopidogrel (PLAVIX) 75 MG tablet Take 1 tablet (75 mg total) by mouth daily.   Cyanocobalamin (VITAMIN B 12 PO) Take 1,000 mcg by mouth daily.   dapagliflozin propanediol (FARXIGA) 10 MG TABS tablet Take 1 tablet (10 mg total) by mouth daily before breakfast.   Dulaglutide (TRULICITY) 1.5 MG/0.5ML SOAJ Inject 1.5 mg into the skin once a week.   DULoxetine (CYMBALTA) 60 MG capsule Take 1 capsule by mouth once daily   fluticasone (CUTIVATE) 0.05 % cream Apply 1 application. topically daily.   furosemide (LASIX) 40 MG tablet Take 1 tablet (40 mg total) by mouth 2 (two) times daily.   gabapentin (NEURONTIN) 300 MG capsule Take 1 capsule by mouth twice daily   Insulin Glargine (BASAGLAR KWIKPEN) 100 UNIT/ML Inject 26 Units into the skin 2 (two) times daily.   Insulin Pen Needle (B-D UF III MINI PEN NEEDLES) 31G X 5 MM MISC 1 each by Does not apply route 4 (  four) times daily.   insulin regular (HUMULIN R) 100 units/mL injection Inject 0.2 mLs (20 Units total) into the skin 3 (three) times daily before meals.   Insulin Syringe-Needle U-100 (RELION INSULIN SYRINGE 1ML/31G) 31G X 5/16" 1 ML MISC USE TO INJECT INSULIN TWICE DAILY AS DIRECTED (Patient taking differently: 1 each by Other route 2 (two) times daily. USE TO INJECT INSULIN TWICE DAILY AS DIRECTED)   metoprolol succinate (TOPROL-XL) 50 MG 24 hr tablet TAKE 1 TABLET BY MOUTH ONCE  DAILY WITH OR IMMEDIATELY FOLLOWING A MEAL   Multiple Vitamin (MULTIVITAMIN ADULT PO) Take 1 tablet by mouth daily.   nitroGLYCERIN (NITROSTAT) 0.4 MG SL tablet Place 1 tablet (0.4 mg total) under the tongue every 5 (five) minutes as needed for chest pain.   Omega-3 Fatty Acids (FISH OIL) 1000 MG CPDR Take 1 tablet by mouth daily.   polyethylene glycol powder (GLYCOLAX/MIRALAX) powder Take 17 g by mouth 2 (two) times daily as needed. (Patient taking differently: Take 17 g by mouth 2 (two) times daily as needed for mild constipation or moderate constipation.)   rosuvastatin (CRESTOR) 5 MG tablet Take 1 tablet by mouth once daily   tamsulosin (FLOMAX) 0.4 MG CAPS capsule Take 1 capsule (0.4 mg total) by mouth daily.   vitamin C (ASCORBIC ACID) 500 MG tablet Take 500 mg by mouth daily.   No facility-administered encounter medications on file as of 12/15/2023.    Allergies (verified) Patient has no known allergies.   History: Past Medical History:  Diagnosis Date   Adenomatous colon polyp 2006   CAD (coronary artery disease)    Calcium oxalate renal stones    Cardiomyopathy    Cataract    Cataract    Cerebral embolism with cerebral infarction 12/26/2018   Chronic systolic heart failure (HCC)         CKD (chronic kidney disease) stage 4, GFR 15-29 ml/min (HCC) 11/08/2017   Claudication in peripheral vascular disease (HCC) 07/23/2020   Dehydration 07/2022   Diabetes (HCC)    Diabetic peripheral neuropathy (HCC) 01/31/2015   Dual implantable cardioverter-defibrillator in situ    01/16/2002 Dr. Ladona Ridgel  RA lead  Guidant [id number] RV lead  Guidant [id number] Generator  Guidant Prism  09/02/2009 Generator change Medtronic [id number]  SN  [id number]      Erectile dysfunction    Erosive esophagitis    Essential hypertension 11/30/2015   Hemorrhoids    History of TIA (transient ischemic attack) 01/19/2019   HLD (hyperlipidemia)    HTN (hypertension)    Hyperlipidemia    Hypertensive heart  disease without CHF 07/31/2011   ICD (implantable cardiac defibrillator) in place    ICD dual chamber in situ    Ischemic cardiomyopathy    EF 40% June 2013    Left-sided weakness 12/25/2018   Metabolic syndrome    Moderate nonproliferative diabetic retinopathy of both eyes without macular edema associated with type 2 diabetes mellitus (HCC) 02/20/2020   Morbid obesity (HCC)    OSA on CPAP 11/05/2014   Osteoarthritis    Posterior vitreous detachment of both eyes 02/20/2020   Presence of automatic (implantable) cardiac defibrillator 11/08/2017   S/P CABG (coronary artery bypass graft) 11/02/2000   Sleep apnea    Stroke-like symptoms 12/26/2018   Syncope 08/06/2009   Qualifier: Diagnosis of  By: Susette Racer CMA, [place]     Type 2 diabetes, uncontrolled, with neuropathy    Has retinopahty and neuropathy     Ventricular tachycardia (paroxysmal) (HCC) 07/14/2016  Past Surgical History:  Procedure Laterality Date   ABDOMINAL EXPLORATION SURGERY     BACK SURGERY     X'3   cardiac bypass     CARDIAC DEFIBRILLATOR PLACEMENT     CARPAL TUNNEL RELEASE     X2, bilateral   CATARACT EXTRACTION     COLONOSCOPY  06/20/2012   Procedure: COLONOSCOPY;  Surgeon: Tessa Figures, MD;  Location: [facility] ENDOSCOPY;  Service: Endoscopy;  Laterality: N/A;   DOPPLER ECHOCARDIOGRAPHY  2003   EP IMPLANTABLE DEVICE N/A 07/14/2016   Procedure: ICD Generator Changeout;  Surgeon: Tammie Fall, MD;  Location: [facility] INVASIVE CV LAB;  Service: Cardiovascular;  Laterality: N/A;   ESOPHAGOGASTRODUODENOSCOPY  06/20/2012   Procedure: ESOPHAGOGASTRODUODENOSCOPY (EGD);  Surgeon: Tessa Figures, MD;  Location: [facility];  Service: Endoscopy;  Laterality: N/A;   EYE SURGERY     LAPAROTOMY     RETINOPATHY SURGERY Bilateral    rotator cuff surgery     left   Family History  Problem Relation Age of Onset   Diabetes Brother    Heart disease Father    Heart disease Mother    Diabetes Mother    Diabetes Brother     Diabetes Brother    Cancer Brother        baldder   Heart disease Brother    Diabetes Sister    Diabetes Sister    Kidney disease Sister        dialysis   Diabetes Sister    Diabetes Sister    Diabetes Sister    Throat cancer Paternal Uncle    Social History   Socioeconomic History   Marital status: Married    Spouse name: Alice   Number of children: 4   Years of education: 11   Highest education level: 12th grade  Occupational History   Occupation: Retired from [employer] business    Employer: RETIRED  Tobacco Use   Smoking status: Never   Smokeless tobacco: Never  Vaping Use   Vaping status: Never Used  Substance and Sexual Activity   Alcohol use: No    Alcohol/week: 0.0 standard drinks of alcohol   Drug use: No   Sexual activity: Yes  Other Topics Concern   Not on file  Social History Narrative   Lives at home with wife   Right handed, Married, 4 kids from previous marriage.  Retired.     Caffeine 1 cup daily.   Social Drivers of Corporate investment banker Strain: Low Risk  (12/15/2023)   Overall Financial Resource Strain (CARDIA)    Difficulty of Paying Living Expenses: Not hard at all  Food Insecurity: No Food Insecurity (12/15/2023)   Hunger Vital Sign    Worried About Running Out of Food in the Last Year: Never true    Ran Out of Food in the Last Year: Never true  Transportation Needs: No Transportation Needs (12/15/2023)   PRAPARE - Administrator, Civil Service (Medical): No    Lack of Transportation (Non-Medical): No  Physical Activity: Insufficiently Active (12/15/2023)   Exercise Vital Sign    Days of Exercise per Week: 1 day    Minutes of Exercise per Session: 10 min  Stress: No Stress Concern Present (12/15/2023)   [employer] of Occupational Health - Occupational Stress Questionnaire    Feeling of Stress : Only a little  Social Connections: Socially Integrated (12/15/2023)   Social Connection and Isolation Panel [NHANES]     Frequency of Communication  with Friends and Family: More than three times a week    Frequency of Social Gatherings with Friends and Family: More than three times a week    Attends Religious Services: More than 4 times per year    Active Member of [employer] or Organizations: Yes    Attends Engineer, structural: More than 4 times per year    Marital Status: Married    Tobacco Counseling Counseling given: Not Answered    Clinical Intake:  Pre-visit preparation completed: Yes  Pain : No/denies pain     Diabetes: Yes CBG done?: No Did pt. bring in CBG monitor from home?: No  Lab Results  Component Value Date   HGBA1C 7.3 (H) 11/04/2023   HGBA1C 9.2 (H) 08/05/2023   HGBA1C 8.0 (H) 04/29/2023     How often do you need to have someone help you when you read instructions, pamphlets, or other written materials from your doctor or pharmacy?: 1 - Never  Interpreter Needed?: No  Information entered by :: Kandis Fantasia LPN   Activities of Daily Living     12/14/2023    5:17 PM  In your present state of health, do you have any difficulty performing the following activities:  Hearing? 1  Vision? 0  Difficulty concentrating or making decisions? 1  Walking or climbing stairs? 1  Dressing or bathing? 0  Doing errands, shopping? 1  Preparing Food and eating ? N  Using the Toilet? N  In the past six months, have you accidently leaked urine? Y  Do you have problems with loss of bowel control? Y  Managing your Medications? Y  Managing your Finances? N  Housekeeping or managing your Housekeeping? Y    Patient Care Team: Dettinger, Elige Radon, MD as PCP - General (Family Medicine) Marinus Maw, MD as PCP - Electrophysiology (Cardiology) Georgeanna Lea, MD as PCP - Cardiology (Cardiology) Kathleene Hazel, MD as Consulting Physician (Internal Medicine) Charlsie Merles Kirstie Peri, DPM as Consulting Physician (Podiatry) Luciana Axe Alford Highland, MD as Consulting Physician  (Ophthalmology) Marinus Maw, MD as Consulting Physician (Cardiology) Helane Gunther, DPM as Consulting Physician (Podiatry) Danella Maiers, [facility] (Pharmacist) Dohmeier, Porfirio Mylar, MD as Consulting Physician (Neurology) Clinton Gallant, RN as [facility] Care Management  Indicate any recent Medical Services you may have received from other than [facility] providers in the past year (date may be approximate).     Assessment:   This is a routine wellness examination for Manuel Kline.  Hearing/Vision screen Hearing Screening - Comments:: Denies hearing difficulties   Vision Screening - Comments:: Wears rx glasses - up to date with routine eye exams with Dr. Elmer Picker    Goals Addressed   None    Depression Screen     12/15/2023    9:19 AM 11/04/2023    9:58 AM 08/30/2023   10:04 AM 08/05/2023   11:22 AM 04/29/2023   10:41 AM 12/14/2022    8:58 AM 10/14/2022   10:19 AM  PHQ 2/9 Scores  PHQ - 2 Score 0 0 2 0 0 0 0  PHQ- 9 Score 4 4 6 4        Fall Risk     12/14/2023    5:17 PM 11/04/2023    9:57 AM 08/30/2023   10:04 AM 08/05/2023   11:22 AM 04/29/2023   10:41 AM  Fall Risk   Falls in the past year? 1 1 1  0 1  Number falls in past yr: 1 1 1  0  Injury with Fall? 1 0 1  1  Risk for fall due to : History of fall(s);Impaired balance/gait;Impaired mobility Impaired balance/gait Impaired mobility;Impaired balance/gait  Impaired balance/gait  Follow up Falls prevention discussed;Education provided;Falls evaluation completed Falls evaluation completed Falls evaluation completed  Falls evaluation completed    MEDICARE RISK AT HOME:  Medicare Risk at Home Any stairs in or around the home?: (Patient-Rptd) No If so, are there any without handrails?: No Home free of loose throw rugs in walkways, pet beds, electrical cords, etc?: (Patient-Rptd) Yes Adequate lighting in your home to reduce risk of falls?: (Patient-Rptd) Yes Life alert?: (Patient-Rptd) Yes Use of a cane, walker or w/c?:  (Patient-Rptd) Yes Grab bars in the bathroom?: (Patient-Rptd) Yes Shower chair or bench in shower?: (Patient-Rptd) Yes Elevated toilet seat or a handicapped toilet?: (Patient-Rptd) Yes  TIMED UP AND GO:  Was the test performed?  No  Cognitive Function: 6CIT completed    03/23/2018   11:07 AM 03/18/2017    8:36 AM 03/16/2016    2:01 PM 03/01/2015   10:37 AM  MMSE - Mini Mental State Exam  Orientation to time 5 5 5 5   Orientation to Place 5 5 5 5   Registration 3 3 3 3   Attention/ Calculation 0 3 4 3   Attention/Calculation-comments not attempted     Recall 2 3 3 3   Language- name 2 objects 2 2 2 2   Language- repeat 1 1 1 1   Language- follow 3 step command 3 3 3 3   Language- read & follow direction 1 1 1 1   Write a sentence 1 1 1 1   Copy design 1 1 1 1   Total score 24 28 29 28         12/15/2023   10:17 AM 12/14/2022    9:00 AM 12/10/2021    9:27 AM 03/27/2019   11:06 AM  6CIT Screen  What Year? 0 points 0 points 0 points 0 points  What month? 0 points 0 points 0 points 0 points  What time? 0 points 0 points 0 points 0 points  Count back from 20 0 points 0 points 0 points 0 points  Months in reverse 0 points 0 points 2 points 2 points  Repeat phrase 0 points 0 points 4 points 4 points  Total Score 0 points 0 points 6 points 6 points    Immunizations Immunization History  Administered Date(s) Administered   Fluad Quad(high Dose 65+) 06/26/2019, 05/22/2020, 06/16/2021, 06/17/2022   Fluad Trivalent(High Dose 65+) 06/04/2023   Influenza, High Dose Seasonal PF 06/14/2017, 07/19/2018   Influenza,inj,Quad PF,6+ Mos 06/12/2014, 06/24/2015, 06/17/2016   Moderna Sars-Covid-2 Vaccination 09/12/2019, 10/13/2019, 07/04/2020   Pneumococcal Conjugate-13 03/01/2015   Pneumococcal Polysaccharide-23 03/16/2016   Rsv, Bivalent, Protein Subunit Rsvpref,pf Pattricia Bores) 07/09/2022   Tdap 02/16/2020   Zoster Recombinant(Shingrix) 02/17/2020, 09/17/2020    Screening Tests Health Maintenance   Topic Date Due   OPHTHALMOLOGY EXAM  03/22/2024   INFLUENZA VACCINE  03/31/2024   HEMOGLOBIN A1C  05/06/2024   FOOT EXAM  08/04/2024   Medicare Annual Wellness (AWV)  12/14/2024   DTaP/Tdap/Td (2 - Td or Tdap) 02/15/2030   Pneumonia Vaccine 46+ Years old  Completed   Zoster Vaccines- Shingrix  Completed   HPV VACCINES  Aged Out   Meningococcal B Vaccine  Aged Out   COVID-19 Vaccine  Discontinued    Health Maintenance  There are no preventive care reminders to display for this patient.  Additional Screening:  Vision Screening: Recommended annual  ophthalmology exams for early detection of glaucoma and other disorders of the eye.  Dental Screening: Recommended annual dental exams for proper oral hygiene  Community Resource Referral / Chronic Care Management: CRR required this visit?  No   CCM required this visit?  No     Plan:     I have personally reviewed and noted the following in the patient's chart:   Medical and social history Use of alcohol, tobacco or illicit drugs  Current medications and supplements including opioid prescriptions. Patient is not currently taking opioid prescriptions. Functional ability and status Nutritional status Physical activity Advanced directives List of other physicians Hospitalizations, surgeries, and ER visits in previous 12 months Vitals Screenings to include cognitive, depression, and falls Referrals and appointments  In addition, I have reviewed and discussed with patient certain preventive protocols, quality metrics, and best practice recommendations. A written personalized care plan for preventive services as well as general preventive health recommendations were provided to patient.     [place], [place]   1/61/0960   After Visit Summary: (MyChart) Due to this being a telephonic visit, the after visit summary with patients personalized plan was offered to patient via MyChart   Notes: Nothing significant to  report at this time.

## 2023-12-15 NOTE — Progress Notes (Signed)
 ENT CONSULT:  Reason for Consult: left parotid masses   HPI: Discussed the use of AI scribe software for clinical note transcription with the patient, who gave verbal consent to proceed.  History of Present Illness Manuel Kline is an 89 year old male with a hx of left parotid massed who presents for evaluation of its growth and associated symptoms.  He has a known parotid mass first discovered incidentally during scans following an accident in 2020. Over the last several months to a year, the mass has increased in size, described as a 'big knot' with discomfort or pain rated at 5 out of 10, present consistently. No numbness, tingling, difficulty opening his mouth, eating, or using facial muscles. The mass is located in the left parotid gland, with one lesion superficial and another deeper in the deep parotid lobe.  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 The left ear is described as 'about dead,' with an audiologist indicating the hearing is about 40-50% impaired. He wears a hearing aid on the left side and receives hearing tests through [employer], [facility] [place].  He has a history of skin cancers around his left ear, including basal cell carcinoma, which was removed some time ago. No surgeries on the head or neck area otherwise.  He is currently on Plavix, prescribed after discontinuing aspirin, following a a bypass heart surgery.   Records Reviewed:  Office visit with ENT 05/13/2021 Dr Hezzie Bump   Impression  Left parotid nodules, detected incidentally on CT in Aug 2020. US-guided FNA in Sept 2020 was non-diagnostic  These are asymptomatic . Benign and malignant etiologies were discussed. The superficial nodule appears to have slow enlargement but overall stable on office [place].   He'd like to keep observing these rather than any surgery or repeat attempt at FNA.   I will see him back in a year, certainly sooner if there are  problems or new syptoms   He is agreeable with this plan. All his questions were answered.  Past Medical History:  Diagnosis Date   Adenomatous colon polyp 2006   CAD (coronary artery disease)    Calcium oxalate renal stones    Cardiomyopathy    Cataract    Cataract    Cerebral embolism with cerebral infarction 12/26/2018   Chronic systolic heart failure (HCC)         CKD (chronic kidney disease) stage 4, GFR 15-29 ml/min (HCC) 11/08/2017   Claudication in peripheral vascular disease (HCC) 07/23/2020   Dehydration 07/2022   Diabetes (HCC)    Diabetic peripheral neuropathy (HCC) 01/31/2015   Dual implantable cardioverter-defibrillator in situ    01/16/2002 Dr. Ladona Ridgel  RA lead  Guidant [id number] RV lead  Guidant [id number] Generator  Guidant Prism  09/02/2009 Generator change Medtronic [id number]  SN  [id number]      Erectile dysfunction    Erosive esophagitis    Essential hypertension 11/30/2015   Hemorrhoids    History of TIA (transient ischemic attack) 01/19/2019   HLD (hyperlipidemia)    HTN (hypertension)    Hyperlipidemia    Hypertensive heart disease without CHF 07/31/2011   ICD (implantable cardiac defibrillator) in place    ICD dual chamber in situ    Ischemic cardiomyopathy    EF 40% June 2013    Left-sided weakness 12/25/2018   Metabolic syndrome    Moderate nonproliferative diabetic retinopathy of both eyes without macular edema associated with type 2 diabetes  mellitus (HCC) 02/20/2020   Morbid obesity (HCC)    OSA on CPAP 11/05/2014   Osteoarthritis    Posterior vitreous detachment of both eyes 02/20/2020   Presence of automatic (implantable) cardiac defibrillator 11/08/2017   S/P CABG (coronary artery bypass graft) 11/02/2000   Sleep apnea    Stroke-like symptoms 12/26/2018   Syncope 08/06/2009   Qualifier: Diagnosis of  By: Sallyanne Creamer CMA, Jewel     Type 2 diabetes, uncontrolled, with neuropathy    Has retinopahty and neuropathy     Ventricular tachycardia  (paroxysmal) (HCC) 07/14/2016    Past Surgical History:  Procedure Laterality Date   ABDOMINAL EXPLORATION SURGERY     BACK SURGERY     X'3   cardiac bypass     CARDIAC DEFIBRILLATOR PLACEMENT     CARPAL TUNNEL RELEASE     X2, bilateral   CATARACT EXTRACTION     COLONOSCOPY  06/20/2012   Procedure: COLONOSCOPY;  Surgeon: Tessa Figures, MD;  Location: [facility] ENDOS[facility];  Service: Endoscopy;  Laterality: N/A;   DOPPLER ECHOCARDIOGRAPHY  2003   EP IMPLANTABLE DEVICE N/A 07/14/2016   Procedure: ICD Generator Changeout;  Surgeon: Tammie Fall, MD;  [facility] INVASIVE CV LAB;  Service: Cardiovascular;  Laterality: N/A;   ESOPHAGOGASTRODUODENOSCOPY  06/20/2012   Procedure: ESOPHAGOGASTRODUODENOSCOPY (EGD);  Surgeon: Tessa Figures, MD;  Location: [facility];  Service: Endoscopy;  Laterality: N/A;   EYE SURGERY     LAPAROTOMY     RETINOPATHY SURGERY Bilateral    rotator cuff surgery     left    Family History  Problem Relation Age of Onset   Diabetes Brother    Heart disease Father    Heart disease Mother    Diabetes Mother    Diabetes Brother    Diabetes Brother    Cancer Brother        baldder   Heart disease Brother    Diabetes Sister    Diabetes Sister    Kidney disease Sister        dialysis   Diabetes Sister    Diabetes Sister    Diabetes Sister    Throat cancer Paternal Uncle     Social History:  reports that he has never smoked. He has never used smokeless tobacco. He reports that he does not drink alcohol and does not use drugs.  Allergies: No Known Allergies  Medications: I have reviewed the patient's current medications.  The PMH, PSH, Medications, Allergies, and SH were reviewed and updated.  ROS: Constitutional: Negative for fever, weight loss and weight gain. Cardiovascular: Negative for chest pain and dyspnea on exertion. Respiratory: Is not experiencing shortness of breath at rest. Gastrointestinal: Negative for nausea and  vomiting. Neurological: Negative for headaches. Psychiatric: The patient is not nervous/anxious  Blood pressure 108/61, pulse 74, SpO2 93%. There is no height or weight on file to calculate BMI.  PHYSICAL EXAM:  Exam: General: Well-developed, well-nourished Communication and Voice: Clear pitch and clarity Respiratory Respiratory effort: Equal inspiration and expiration without stridor Cardiovascular Peripheral Vascular: Warm extremities with equal color/perfusion Eyes: No nystagmus with equal extraocular motion bilaterally Neuro/Psych/Balance: Patient oriented to person, place, and time; Appropriate mood and affect; Gait is intact with no imbalance; Cranial nerves I-XII are intact Head and Face Inspection: Normocephalic and atraumatic without mass or lesion Palpation: Facial skeleton intact without bony stepoffs Salivary Glands: Palpable 2.5 cm mass along the mid to lower left parotid gland, no overlying skin changes Facial Strength: Facial motility symmetric  and full bilaterally ENT Pinna: External ear intact and fully developed External canal: Canal is patent with intact skin Tympanic Membrane: Clear and mobile External Nose: No scar or anatomic deformity Internal Nose: Septum is relatively straight on anterior rhinoscopy. No polyp, or purulence. Mucosal edema and erythema present.  Bilateral inferior turbinate hypertrophy.  Lips, Teeth, and gums: Mucosa and teeth intact and viable TMJ: No pain to palpation with full mobility Oral cavity/oropharynx: No erythema or exudate, no lesions present Neck Neck and Trachea: Midline trachea without mass or lesion Thyroid: No mass or nodularity Lymphatics: No lymphadenopathy   Studies Reviewed: Neck soft tissue U/S 11/19/23 IMPRESSION: 1. 2.3 x 1.2 x 2.1 cm left intraparotid mass may be an abnormally enlarged lymph node versus primary parotid neoplasm. 2. Additional mildly enlarged lymph nodes noted in the left submandibular region  may be reactive, however metastatic disease is not excluded. 3. Further evaluation with contrast enhanced soft tissue neck CT would be beneficial.  CT soft tissue neck w/o contrast 11/22/23  Narrative & Impression  CLINICAL DATA:  89 year old male with left side neck pain and 2.3 cm suspected left parotid lesion on ultrasound.   EXAM: CT NECK WITHOUT CONTRAST   TECHNIQUE: Multidetector CT imaging of the neck was performed following the standard protocol without intravenous contrast.   RADIATION DOSE REDUCTION: This exam was performed according to the departmental dose-optimization program which includes automated exposure control, adjustment of the mA and/or kV according to patient size and/or use of iterative reconstruction technique.   COMPARISON:  Ultrasound 11/19/2023. head CT 12/25/2018.   FINDINGS: Pharynx and larynx: Noncontrast appearance within normal limits.   Salivary glands: Noncontrast sublingual and submandibular spaces are normal. Noncontrast right parotid gland is within normal limits except for punctate sialolithiasis on series 3, image 30.   Thyroid: Left parotid gland is remarkable for multiple Vicks density internal nodule/masses. In the superficial lobe of the left parotid there is a nearly isodense and indistinct rounded, slightly stellate nodule measuring 25 by 22 x 25 mm. This was present on the 2020 CT, approximately 19 mm at that time. Posterior and superior to that lesion is a smaller 12 mm hyperdense nodule which was present, and in the deep lobe there is an adjacent elongated 2.5 cm nodule, which also was present. Soft tissue at the left stylomastoid foramen appears to remain normal. No regional lymphadenopathy or inflammation identified.   Lymph nodes: No other cervical lymphadenopathy.   Vascular: Heavily calcified bilateral carotid bifurcations with evidence of string sign stenosis on the left series 3, image 60. Vascular patency is not  evaluated in the absence of IV contrast.   Limited intracranial: Calcified atherosclerosis at the skull base. Negative visible noncontrast brain parenchyma.   Visualized orbits: Postoperative changes to both globes.   Mastoids and visualized paranasal sinuses: Visualized paranasal sinuses and mastoids are stable and well aerated.   Skeleton: Absent dentition. Widespread chronic and bulky cervical spine degeneration. Previous sternotomy. No acute or suspicious osseous lesion identified.   Upper chest: CABG. Left chest pacemaker. Calcified aortic atherosclerosis. Visible superior mediastinal lymph nodes are within normal limits. Negative visible upper lungs.   IMPRESSION: 1. Several mixed density left parotid gland nodule/masses ranging from 1.2 to 2.5 cm diameter. Multiplicity favors benign primary salivary neoplasms, AND all three lesions were present in 2020. The more isodense lesion has mildly enlarged since that time, but given absence regional lymphadenopathy and other suspicious features benign histology is favored.   2. Positive for bulky calcified left  carotid artery bifurcation with evidence of a STRING SIGN STENOSIS of the Left ICA origin. Vascular patency is not evaluated in the absence of IV contrast.    Assessment/Plan: Encounter Diagnoses  Name Primary?   Mass of left parotid gland Yes   Sensorineural hearing loss (SNHL) of both ears     Assessment and Plan Assessment & Plan Parotid gland masses left side Incidental parotid massed with minimal growth over five years (largest and superficial mass appears to enlarge ~ 5mm over the last 5 yrs, had two other masses, one located in deep lobe, high surgical risk due to facial nerve proximity. Prior FNA was non-diagnostic per record review.  - Order FNA biopsy to assess for risk malignancy, overall slow growth and no evidence of facial nerve weakness are reassuring. - Refer to head and neck oncology specialists at  [facility] for surgical consultation. - Discuss risks and benefits of surgery, including facial nerve impact and age-related risks.  Hearing loss Gradual hearing loss, more pronounced in left ear per report. Uses hearing aids, primarily left ear. - Request previous hearing test results from [employer], [facility] [place].  Chronic kidney disease stage 4 Stage 4 CKD limits use of contrast in imaging due to nephrotoxicity risk. Considered in decision against CT with contrast for parotid mass.  Basal cell carcinoma left ear Previously excised basal cell carcinoma  - request records of prior surgery if available  Follow-up Follow-up needed to review biopsy results. - Schedule follow-up in a couple of months to review biopsy results and consultation progress. - schedule consultation at [facility] with Head and Neck team  Thank you for allowing me to participate in the care of this patient. Please do not hesitate to contact me with any questions or concerns.   Artice Last, MD Otolaryngology [facility] Phone: [phone] Fax: [phone]    12/15/2023, 5:31 PM

## 2023-12-15 NOTE — Patient Instructions (Signed)
 Manuel Kline , Thank you for taking time to come for your Medicare Wellness Visit. I appreciate your ongoing commitment to your health goals. Please review the following plan we discussed and let me know if I can assist you in the future.   Referrals/Orders/Follow-Ups/Clinician Recommendations: Aim for 30 minutes of exercise or brisk walking, 6-8 glasses of water, and 5 servings of fruits and vegetables each day.  This is a list of the screening recommended for you and due dates:  Health Maintenance  Topic Date Due   Eye exam for diabetics  03/22/2024   Flu Shot  03/31/2024   Hemoglobin A1C  05/06/2024   Complete foot exam   08/04/2024   Medicare Annual Wellness Visit  12/14/2024   DTaP/Tdap/Td vaccine (2 - Td or Tdap) 02/15/2030   Pneumonia Vaccine  Completed   Zoster (Shingles) Vaccine  Completed   HPV Vaccine  Aged Out   Meningitis B Vaccine  Aged Out   COVID-19 Vaccine  Discontinued    Advanced directives: (ACP Link)Information on Advanced Care Planning can be found at [place]  Secretary of [facility] Advance Health Care Directives Advance Health Care Directives. [email or web address]   Next Medicare Annual Wellness Visit scheduled for next year: Yes

## 2023-12-16 ENCOUNTER — Encounter (INDEPENDENT_AMBULATORY_CARE_PROVIDER_SITE_OTHER): Payer: Self-pay

## 2023-12-20 NOTE — Progress Notes (Unsigned)
 Manuel Asper, DO  [facility]; P Ir Procedure Requests OK for US  guided left parotid mass FNA.  Mabel Savage       Previous Messages    ----- Message ----- From: Derrell Flight Sent: 12/16/2023   9:00 AM EDT To: [facility]rell Flight; Ir Procedure Requests Subject: US  FNA SALIVARY GLAND/PAROTID GLAND            Procedure : US  FNA SALIVARY GLAND/PAROTID GLAND  Reason :left parotid mass, Mass of left parotid gland  History :CT SOFT TISSUE NECK WO CONTRAST,US  Soft Tissue Head/Neck,DG Chest 2 View  Provider: Artice Last, MD  Provider contact :[phone]

## 2023-12-21 ENCOUNTER — Telehealth: Payer: Self-pay | Admitting: Family Medicine

## 2023-12-21 DIAGNOSIS — E119 Type 2 diabetes mellitus without complications: Secondary | ICD-10-CM | POA: Diagnosis not present

## 2023-12-21 NOTE — Progress Notes (Unsigned)
 Myrlene Asper, DO  [facility] Every biopsy patient gets a chance for mod sed if they want.  There is no need to ask follow up questions about this.  Every patient can be asked.  That has never changed. If the patient defers that is fine.  JW       Previous Messages    ----- Message ----- From: [facility]rell Flight Sent: 12/20/2023   5:00 PM EDT To: Myrlene Asper, DO Subject: RE: US  FNA SALIVARY GLAND/PAROTID GLAND        Dr. Mabel Savage,,  Dose this procedure need to be done with local anesthetic or moderate sedation? ----- Message ----- From: Myrlene Asper, DO Sent: 12/20/2023   3:41 PM EDT To: [facility]; Ir Procedure Requests Subject: RE: US  FNA SALIVARY GLAND/PAROTID GLAND        OK for US  guided left parotid mass FNA.  Mabel Savage ----- Message ----- From: [facility]rell Flight Sent: 12/16/2023   9:00 AM EDT To: [facility] Flight; Ir Procedure Requests Subject: US  FNA SALIVARY GLAND/PAROTID GLAND            Procedure : US  FNA SALIVARY GLAND/PAROTID GLAND  Reason :left parotid mass, Mass of left parotid gland  History :CT SOFT TISSUE NECK WO CONTRAST,US  Soft Tissue Head/Neck,DG Chest 2 View  Provider: Artice Last, MD  Provider contact :[phone]

## 2023-12-21 NOTE — Telephone Encounter (Signed)
 Surgery Clearance form received via fax. Will put on PCP desk.

## 2023-12-22 ENCOUNTER — Other Ambulatory Visit: Payer: Self-pay | Admitting: Family Medicine

## 2023-12-22 DIAGNOSIS — Z8673 Personal history of transient ischemic attack (TIA), and cerebral infarction without residual deficits: Secondary | ICD-10-CM

## 2023-12-22 DIAGNOSIS — E1142 Type 2 diabetes mellitus with diabetic polyneuropathy: Secondary | ICD-10-CM

## 2024-01-18 ENCOUNTER — Other Ambulatory Visit: Payer: Self-pay | Admitting: Radiology

## 2024-01-18 DIAGNOSIS — K118 Other diseases of salivary glands: Secondary | ICD-10-CM

## 2024-01-19 ENCOUNTER — Ambulatory Visit (INDEPENDENT_AMBULATORY_CARE_PROVIDER_SITE_OTHER): Payer: Medicare Other

## 2024-01-19 ENCOUNTER — Other Ambulatory Visit (INDEPENDENT_AMBULATORY_CARE_PROVIDER_SITE_OTHER): Payer: Self-pay | Admitting: Otolaryngology

## 2024-01-19 ENCOUNTER — Ambulatory Visit: Payer: Self-pay | Admitting: Internal Medicine

## 2024-01-19 ENCOUNTER — Ambulatory Visit (HOSPITAL_COMMUNITY)
Admission: RE | Admit: 2024-01-19 | Discharge: 2024-01-19 | Disposition: A | Discharge status: Home / Self Care | Source: Ambulatory Visit | Attending: Otolaryngology | Admitting: Otolaryngology

## 2024-01-19 ENCOUNTER — Other Ambulatory Visit: Payer: Self-pay

## 2024-01-19 DIAGNOSIS — D110 Benign neoplasm of parotid gland: Secondary | ICD-10-CM | POA: Diagnosis not present

## 2024-01-19 DIAGNOSIS — K118 Other diseases of salivary glands: Secondary | ICD-10-CM | POA: Diagnosis not present

## 2024-01-19 DIAGNOSIS — I255 Ischemic cardiomyopathy: Secondary | ICD-10-CM | POA: Diagnosis not present

## 2024-01-19 DIAGNOSIS — H903 Sensorineural hearing loss, bilateral: Secondary | ICD-10-CM

## 2024-01-19 LAB — CUP PACEART REMOTE DEVICE CHECK
Battery Remaining Longevity: 27 mo
Battery Voltage: 2.93 V
Brady Statistic AP VP Percent: 0.8 %
Brady Statistic AP VS Percent: 80.11 %
Brady Statistic AS VP Percent: 0.05 %
Brady Statistic AS VS Percent: 19.03 %
Brady Statistic RA Percent Paced: 80.32 %
Brady Statistic RV Percent Paced: 0.7 %
Date Time Interrogation Session: 20250521072310
HighPow Impedance: 49 Ohm
HighPow Impedance: 68 Ohm
Implantable Lead Connection Status: 753985
Implantable Lead Connection Status: 753985
Implantable Lead Implant Date: 20030519
Implantable Lead Implant Date: 20030519
Implantable Lead Location: 753859
Implantable Lead Location: 753860
Implantable Lead Model: 158
Implantable Lead Model: 4087
Implantable Lead Serial Number: 115102
Implantable Lead Serial Number: 159999
Implantable Pulse Generator Implant Date: 20171114
Lead Channel Impedance Value: 399 Ohm
Lead Channel Impedance Value: 4047 Ohm
Lead Channel Impedance Value: 4047 Ohm
Lead Channel Impedance Value: 4047 Ohm
Lead Channel Impedance Value: 437 Ohm
Lead Channel Impedance Value: 437 Ohm
Lead Channel Pacing Threshold Amplitude: 1.125 V
Lead Channel Pacing Threshold Amplitude: 1.375 V
Lead Channel Pacing Threshold Pulse Width: 0.4 ms
Lead Channel Pacing Threshold Pulse Width: 0.4 ms
Lead Channel Sensing Intrinsic Amplitude: 11.75 mV
Lead Channel Sensing Intrinsic Amplitude: 11.75 mV
Lead Channel Sensing Intrinsic Amplitude: 2.625 mV
Lead Channel Sensing Intrinsic Amplitude: 2.625 mV
Lead Channel Setting Pacing Amplitude: 2 V
Lead Channel Setting Pacing Amplitude: 2.5 V
Lead Channel Setting Pacing Pulse Width: 0.4 ms
Lead Channel Setting Sensing Sensitivity: 0.3 mV
Zone Setting Status: 755011

## 2024-01-19 LAB — GLUCOSE, CAPILLARY: Glucose-Capillary: 177 mg/dL — ABNORMAL HIGH (ref 70–99)

## 2024-01-19 MED ORDER — SODIUM CHLORIDE 0.9% FLUSH: 3.0000 mL | Freq: Two times a day (BID) | INTRAVENOUS | Status: DC

## 2024-01-19 MED ORDER — FENTANYL CITRATE (PF) 100 MCG/2ML IJ SOLN
INTRAMUSCULAR | Status: AC
  Filled 2024-01-19: qty 2

## 2024-01-19 MED ORDER — SODIUM CHLORIDE 0.9% FLUSH: 3.0000 mL | INTRAVENOUS | Status: DC | PRN

## 2024-01-19 MED ORDER — MIDAZOLAM HCL 2 MG/2ML IJ SOLN
INTRAMUSCULAR | Status: AC
  Filled 2024-01-19: qty 2

## 2024-01-19 MED ORDER — LIDOCAINE HCL (PF) 1 % IJ SOLN
2.0000 mL | Freq: Once | INTRAMUSCULAR | Status: AC
  Administered 2024-01-19: 2 mL via INTRADERMAL

## 2024-01-19 MED ORDER — FENTANYL CITRATE (PF) 100 MCG/2ML IJ SOLN
INTRAMUSCULAR | Status: AC | PRN
  Administered 2024-01-19 (×2): 25 ug via INTRAVENOUS

## 2024-01-19 MED ORDER — MIDAZOLAM HCL 2 MG/2ML IJ SOLN
INTRAMUSCULAR | Status: AC | PRN
  Administered 2024-01-19: 1 mg via INTRAVENOUS

## 2024-01-19 NOTE — Procedures (Signed)
 Interventional Radiology Procedure Note  Procedure: Ultrasound guided left parotid mass biopsy  Findings: Please refer to procedural dictation for full description. 18 ga core x3.  Complications: None immediate  Estimated Blood Loss: < 5 ml  Recommendations: 1 hour bedrest. Follow Pathology results.   Creasie Doctor, MD

## 2024-01-19 NOTE — H&P (Signed)
 Chief Complaint: Parotid mass  Referring Provider(s): [id number]  Supervising Physician: Creasie Doctor  Patient Status: [facility] - Out-pt  History of Present Illness: Manuel Kline is a 89 y.o. male with incidental parotid masses.  There has been minimal growth over five years.  The largest and superficial mass appears to have enlarged ~ 5mm over the last 5 yrs  Prior FNA was non-diagnostic.   We are asked to perform an a biopsy to assess for risk malignancy.  Patient is Full Code  Past Medical History:  Diagnosis Date   Adenomatous colon polyp 2006   CAD (coronary artery disease)    Calcium  oxalate renal stones    Cardiomyopathy    Cataract    Cataract    Cerebral embolism with cerebral infarction 12/26/2018   Chronic systolic heart failure (HCC)         CKD (chronic kidney disease) stage 4, GFR 15-29 ml/min (HCC) 11/08/2017   Claudication in peripheral vascular disease (HCC) 07/23/2020   Dehydration 07/2022   Diabetes (HCC)    Diabetic peripheral neuropathy (HCC) 01/31/2015   Dual implantable cardioverter-defibrillator in situ    01/16/2002 Dr. Carolynne Citron  RA lead  Guidant [id number] RV lead  Guidant [id number] Generator  Guidant Prism   09/02/2009 Generator change Medtronic [id number]  SN  [id number]      Erectile dysfunction    Erosive esophagitis    Essential hypertension 11/30/2015   Hemorrhoids    History of TIA (transient ischemic attack) 01/19/2019   HLD (hyperlipidemia)    HTN (hypertension)    Hyperlipidemia    Hypertensive heart disease without CHF 07/31/2011   ICD (implantable cardiac defibrillator) in place    ICD dual chamber in situ    Ischemic cardiomyopathy    EF 40% June 2013    Left-sided weakness 12/25/2018   Metabolic syndrome    Moderate nonproliferative diabetic retinopathy of both eyes without macular edema associated with type 2 diabetes mellitus (HCC) 02/20/2020   Morbid obesity (HCC)    OSA on CPAP 11/05/2014   Osteoarthritis    Posterior  vitreous detachment of both eyes 02/20/2020   Presence of automatic (implantable) cardiac defibrillator 11/08/2017   S/P CABG (coronary artery bypass graft) 11/02/2000   Sleep apnea    Stroke-like symptoms 12/26/2018   Syncope 08/06/2009   Qualifier: Diagnosis of  By: Sallyanne Creamer CMA, Jewel     Type 2 diabetes, uncontrolled, with neuropathy    Has retinopahty and neuropathy     Ventricular tachycardia (paroxysmal) (HCC) 07/14/2016    Past Surgical History:  Procedure Laterality Date   ABDOMINAL EXPLORATION SURGERY     BACK SURGERY     X'3   cardiac bypass     CARDIAC DEFIBRILLATOR PLACEMENT     CARPAL TUNNEL RELEASE     X2, bilateral   CATARACT EXTRACTION     COLONOSCOPY  06/20/2012   Procedure: COLONOSCOPY;  Surgeon: Tessa Figures, MD;  Location: [facility];  Service: Endoscopy;  Laterality: N/A;   DOPPLER ECHOCARDIOGRAPHY  2003   EP IMPLANTABLE DEVICE N/A 07/14/2016   Procedure: ICD Generator Changeout;  Surgeon: Tammie Fall, MD;  Location: [facility] INVASIVE CV LAB;  Service: Cardiovascular;  Laterality: N/A;   ESOPHAGOGASTRODUODENOSCOPY  06/20/2012   Procedure: ESOPHAGOGASTRODUODENOSCOPY (EGD);  Surgeon: Tessa Figures, MD;  Location: [facility];  Service: Endoscopy;  Laterality: N/A;   EYE SURGERY     LAPAROTOMY     RETINOPATHY SURGERY Bilateral    rotator cuff surgery  left    Allergies: Patient has no known allergies.  Medications: Prior to Admission medications   Medication Sig Start Date End Date Taking? Authorizing Provider  allopurinol  (ZYLOPRIM ) 300 MG tablet Take 0.5 tablets (150 mg total) by mouth daily. Take 1/2 (one-half) tablet by mouth once daily 08/05/23   Dettinger, Lucio Sabin, MD  benazepril  (LOTENSIN ) 40 MG tablet Take 1 tablet by mouth once daily 11/25/23   Dettinger, Joshua A, MD  clopidogrel  (PLAVIX ) 75 MG tablet Take 1 tablet by mouth once daily 12/22/23   Dettinger, Lucio Sabin, MD  Cyanocobalamin (VITAMIN B 12 PO) Take 1,000 mcg by mouth daily.     [provider]  dapagliflozin  propanediol (FARXIGA ) 10 MG TABS tablet Take 1 tablet (10 mg total) by mouth daily before breakfast. 11/25/23   Dettinger, Lucio Sabin, MD  Dulaglutide  (TRULICITY ) 1.5 MG/0.5ML SOAJ Inject 1.5 mg into the skin once a week. 08/18/23   Dettinger, Lucio Sabin, MD  DULoxetine  (CYMBALTA ) 60 MG capsule Take 1 capsule by mouth once daily 12/22/23   Dettinger, Lucio Sabin, MD  fluticasone  (CUTIVATE ) 0.05 % cream Apply 1 application. topically daily. 11/11/21   [provider]  furosemide  (LASIX ) 40 MG tablet Take 1 tablet (40 mg total) by mouth 2 (two) times daily. 10/14/22   Dettinger, Lucio Sabin, MD  gabapentin  (NEURONTIN ) 300 MG capsule Take 1 capsule by mouth twice daily 12/22/23   Dettinger, Joshua A, MD  Insulin  Glargine (BASAGLAR  [facility]) 100 UNIT/ML Inject 26 Units into the skin 2 (two) times daily. 09/08/23   Dettinger, Lucio Sabin, MD  Insulin  Pen Needle (B-D UF III MINI PEN NEEDLES) 31G X 5 MM MISC 1 each by Does not apply route 4 (four) times daily. 10/14/22   Dettinger, Lucio Sabin, MD  insulin  regular (HUMULIN  R) 100 units/mL injection Inject 0.2 mLs (20 Units total) into the skin 3 (three) times daily before meals. 08/18/23   Dettinger, Lucio Sabin, MD  Insulin  Syringe-Needle U-100 (RELION INSULIN  SYRINGE 1ML/31G) 31G X 5/16" 1 ML MISC USE TO INJECT INSULIN  TWICE DAILY AS DIRECTED Patient taking differently: 1 each by Other route 2 (two) times daily. USE TO INJECT INSULIN  TWICE DAILY AS DIRECTED 05/13/23   Dettinger, Lucio Sabin, MD  metoprolol  succinate (TOPROL -XL) 50 MG 24 hr tablet TAKE 1 TABLET BY MOUTH ONCE DAILY WITH OR IMMEDIATELY FOLLOWING A MEAL 12/22/23   Dettinger, Lucio Sabin, MD  Multiple Vitamin (MULTIVITAMIN ADULT PO) Take 1 tablet by mouth daily.    [provider]  nitroGLYCERIN  (NITROSTAT ) 0.4 MG SL tablet Place 1 tablet (0.4 mg total) under the tongue every 5 (five) minutes as needed for chest pain. 04/09/22   Dettinger, Lucio Sabin, MD  Omega-3 Fatty Acids  (FISH OIL) 1000 MG CPDR Take 1 tablet by mouth daily.    [provider]  polyethylene glycol powder (GLYCOLAX /MIRALAX ) powder Take 17 g by mouth 2 (two) times daily as needed. Patient taking differently: Take 17 g by mouth 2 (two) times daily as needed for mild constipation or moderate constipation. 12/22/17   Domenic Fridge, MD  rosuvastatin  (CRESTOR ) 5 MG tablet Take 1 tablet by mouth once daily 11/25/23   Dettinger, Lucio Sabin, MD  tamsulosin  (FLOMAX ) 0.4 MG CAPS capsule Take 1 capsule (0.4 mg total) by mouth daily. 08/05/23   Dettinger, Lucio Sabin, MD  vitamin C (ASCORBIC ACID) 500 MG tablet Take 500 mg by mouth daily.    [provider]     Family History  Problem Relation Age  of Onset   Diabetes Brother    Heart disease Father    Heart disease Mother    Diabetes Mother    Diabetes Brother    Diabetes Brother    Cancer Brother        baldder   Heart disease Brother    Diabetes Sister    Diabetes Sister    Kidney disease Sister        dialysis   Diabetes Sister    Diabetes Sister    Diabetes Sister    Throat cancer Paternal Uncle     Social History   Socioeconomic History   Marital status: Married    Spouse name: Alice   Number of children: 4   Years of education: 11   Highest education level: 12th grade  Occupational History   Occupation: Retired from [employer] business    Employer: RETIRED  Tobacco Use   Smoking status: Never   Smokeless tobacco: Never  Vaping Use   Vaping status: Never Used  Substance and Sexual Activity   Alcohol use: No    Alcohol/week: 0.0 standard drinks of alcohol   Drug use: No   Sexual activity: Yes  Other Topics Concern   Not on file  Social History Narrative   Lives at home with wife   Right handed, Married, 4 kids from previous marriage.  Retired.     Caffeine 1 cup daily.   Social Drivers of Corporate investment banker Strain: Low Risk  (12/15/2023)   Overall Financial Resource Strain (CARDIA)    Difficulty of  Paying Living Expenses: Not hard at all  Food Insecurity: No Food Insecurity (12/15/2023)   Hunger Vital Sign    Worried About Running Out of Food in the Last Year: Never true    Ran Out of Food in the Last Year: Never true  Transportation Needs: No Transportation Needs (12/15/2023)   PRAPARE - Administrator, Civil Service (Medical): No    Lack of Transportation (Non-Medical): No  Physical Activity: Insufficiently Active (12/15/2023)   Exercise Vital Sign    Days of Exercise per Week: 1 day    Minutes of Exercise per Session: 10 min  Stress: No Stress Concern Present (12/15/2023)   [employer] of Occupational Health - Occupational Stress Questionnaire    Feeling of Stress : Only a little  Social Connections: Socially Integrated (12/15/2023)   Social Connection and Isolation Panel [NHANES]    Frequency of Communication with Friends and Family: More than three times a week    Frequency of Social Gatherings with Friends and Family: More than three times a week    Attends Religious Services: More than 4 times per year    Active Member of [employer] or Organizations: Yes    Attends Engineer, structural: More than 4 times per year    Marital Status: Married     Review of Systems: A 12 point ROS discussed and pertinent positives are indicated in the HPI above.  All other systems are negative.   Vital Signs: There were no vitals taken for this visit.  Advance Care Plan: The advanced care place/surrogate decision maker was discussed at the time of visit and the patient did not wish to discuss or was not able to name a surrogate decision maker or provide an advance care plan.  Physical Exam Vitals reviewed.  Constitutional:      Appearance: Normal appearance.  HENT:     Head: Normocephalic and atraumatic.  Eyes:  Extraocular Movements: Extraocular movements intact.  Cardiovascular:     Rate and Rhythm: Normal rate and regular rhythm.  Pulmonary:     Effort:  Pulmonary effort is normal. No respiratory distress.     Breath sounds: Normal breath sounds.  Abdominal:     General: There is no distension.     Palpations: Abdomen is soft.     Tenderness: There is no abdominal tenderness.  Musculoskeletal:        General: Normal range of motion.     Cervical back: Normal range of motion.  Skin:    General: Skin is warm and dry.  Neurological:     General: No focal deficit present.     Mental Status: He is alert and oriented to person, place, and time.  Psychiatric:        Mood and Affect: Mood normal.        Behavior: Behavior normal.        Thought Content: Thought content normal.        Judgment: Judgment normal.     Imaging:  CLINICAL DATA:  89 year old male with left side neck pain and 2.3 cm suspected left parotid lesion on ultrasound.   EXAM: CT NECK WITHOUT CONTRAST   TECHNIQUE: Multidetector CT imaging of the neck was performed following the standard protocol without intravenous contrast.   RADIATION DOSE REDUCTION: This exam was performed according to the departmental dose-optimization program which includes automated exposure control, adjustment of the mA and/or kV according to patient size and/or use of iterative reconstruction technique.   COMPARISON:  Ultrasound 11/19/2023. head CT 12/25/2018.   FINDINGS: Pharynx and larynx: Noncontrast appearance within normal limits.   Salivary glands: Noncontrast sublingual and submandibular spaces are normal. Noncontrast right parotid gland is within normal limits except for punctate sialolithiasis on series 3, image 30.   Thyroid : Left parotid gland is remarkable for multiple Vicks density internal nodule/masses. In the superficial lobe of the left parotid there is a nearly isodense and indistinct rounded, slightly stellate nodule measuring 25 by 22 x 25 mm. This was present on the 2020 CT, approximately 19 mm at that time. Posterior and superior to that lesion is a smaller  12 mm hyperdense nodule which was present, and in the deep lobe there is an adjacent elongated 2.5 cm nodule, which also was present. Soft tissue at the left stylomastoid foramen appears to remain normal. No regional lymphadenopathy or inflammation identified.   Lymph nodes: No other cervical lymphadenopathy.   Vascular: Heavily calcified bilateral carotid bifurcations with evidence of string sign stenosis on the left series 3, image 60. Vascular patency is not evaluated in the absence of IV contrast.   Limited intracranial: Calcified atherosclerosis at the skull base. Negative visible noncontrast brain parenchyma.   Visualized orbits: Postoperative changes to both globes.   Mastoids and visualized paranasal sinuses: Visualized paranasal sinuses and mastoids are stable and well aerated.   Skeleton: Absent dentition. Widespread chronic and bulky cervical spine degeneration. Previous sternotomy. No acute or suspicious osseous lesion identified.   Upper chest: CABG. Left chest pacemaker. Calcified aortic atherosclerosis. Visible superior mediastinal lymph nodes are within normal limits. Negative visible upper lungs.   IMPRESSION: 1. Several mixed density left parotid gland nodule/masses ranging from 1.2 to 2.5 cm diameter. Multiplicity favors benign primary salivary neoplasms, AND all three lesions were present in 2020. The more isodense lesion has mildly enlarged since that time, but given absence regional lymphadenopathy and other suspicious features benign histology is favored.  2. Positive for bulky calcified left carotid artery bifurcation with evidence of a STRING SIGN STENOSIS of the Left ICA origin. Vascular patency is not evaluated in the absence of IV contrast.     Electronically Signed   By: Marlise Simpers M.D.   On: 11/22/2023 13:38    Labs:  CBC: Recent Labs    01/27/23 1029 04/29/23 1046 08/05/23 1123 11/04/23 0959  WBC 8.3 9.6 7.9 7.4  HGB 13.8 12.9* 14.4  12.9*  HCT 40.3 39.3 43.4 38.7  PLT 192 170 204 167    COAGS: No results for input(s): "INR", "APTT" in the last 8760 hours.  BMP: Recent Labs    01/27/23 1029 08/05/23 1123 11/04/23 0959 11/22/23 1321  NA 138 139 138  --   K 4.7 5.0 4.9  --   CL 100 99 101  --   CO2 22 22 23   --   GLUCOSE 90 159* 147*  --   BUN 33* 53* 53*  --   CALCIUM  9.3 9.7 9.3  --   CREATININE 2.13* 2.46* 2.39* 2.30*    LIVER FUNCTION TESTS: Recent Labs    01/27/23 1029 08/05/23 1123 11/04/23 0959  BILITOT 0.5 0.4 0.5  AST 28 23 25   ALT 23 18 17   ALKPHOS 112 133* 119  PROT 6.8 7.1 6.8  ALBUMIN 4.3 4.4 4.3    TUMOR MARKERS: No results for input(s): "AFPTM", "CEA", "CA199", "CHROMGRNA" in the last 8760 hours.  Assessment and Plan:  Left parotid nodule - will proceed with image guided biopsy of the parotid nodule today by Dr. Jinx Mourning  Risks and benefits of parotid biopsy was discussed with the patient and/or patient's family including, but not limited to bleeding, infection, damage to adjacent structures or low yield requiring additional tests.  All of the questions were answered and there is agreement to proceed.  Consent signed and in chart.    Electronically Signed: Connor Deiters, PA-C   01/19/2024, 12:02 PM      I spent a total of  30 Minutes   in face to face in clinical consultation, greater than 50% of which was counseling/coordinating care for parotid biopsy

## 2024-01-20 ENCOUNTER — Telehealth (INDEPENDENT_AMBULATORY_CARE_PROVIDER_SITE_OTHER): Payer: Self-pay

## 2024-01-20 LAB — SURGICAL PATHOLOGY

## 2024-01-20 NOTE — Progress Notes (Unsigned)
 Manuel Kline

## 2024-01-20 NOTE — Telephone Encounter (Signed)
 Spoke with patient regarding biopsy results, he understood.

## 2024-01-21 ENCOUNTER — Encounter: Payer: Self-pay | Admitting: Cardiology

## 2024-01-21 ENCOUNTER — Ambulatory Visit: Attending: Cardiology | Admitting: Cardiology

## 2024-01-21 VITALS — BP 102/62 | HR 60 | Ht 65.0 in | Wt 222.0 lb

## 2024-01-21 DIAGNOSIS — I10 Essential (primary) hypertension: Secondary | ICD-10-CM

## 2024-01-21 DIAGNOSIS — E119 Type 2 diabetes mellitus without complications: Secondary | ICD-10-CM

## 2024-01-21 DIAGNOSIS — Z95810 Presence of automatic (implantable) cardiac defibrillator: Secondary | ICD-10-CM

## 2024-01-21 DIAGNOSIS — I5022 Chronic systolic (congestive) heart failure: Secondary | ICD-10-CM

## 2024-01-21 DIAGNOSIS — I2510 Atherosclerotic heart disease of native coronary artery without angina pectoris: Secondary | ICD-10-CM

## 2024-01-21 DIAGNOSIS — Z794 Long term (current) use of insulin: Secondary | ICD-10-CM

## 2024-01-21 DIAGNOSIS — Z7984 Long term (current) use of oral hypoglycemic drugs: Secondary | ICD-10-CM

## 2024-01-21 NOTE — Patient Instructions (Signed)

## 2024-01-21 NOTE — Progress Notes (Unsigned)
 Cardiology Office Note:    Date:  01/21/2024   ID:  Manuel Kline, DOB [birth year], MRN [id number]  PCP:  Dettinger, Lucio Sabin, MD  Cardiologist:  Ralene Burger, MD    Referring MD: Dettinger, Lucio Sabin, MD   Chief Complaint  Patient presents with   Follow-up    History of Present Illness:    Manuel Kline is a 89 y.o. male past medical history significant for coronary artery disease status post coronary bypass graft done by Dr. Nicanor Barge in 2002, history of ischemic cardiomyopathy with last echocardiogram showed normalization of left ventricle ejection fraction, ICD present which is Medtronic device, diabetes, obstructive sleep apnea, essential hypertension hard of hearing.  Comes today 2 months of follow-up cardiac wise doing well.  He is 89 years old but still does have some garden few tomato plants few cucumber what he does he is seeing manage he does plan as well sitting in the chair.  Past Medical History:  Diagnosis Date   Adenomatous colon polyp 2006   CAD (coronary artery disease)    Calcium  oxalate renal stones    Cardiomyopathy    Cataract    Cataract    Cerebral embolism with cerebral infarction 12/26/2018   Chronic systolic heart failure (HCC)         CKD (chronic kidney disease) stage 4, GFR 15-29 ml/min (HCC) 11/08/2017   Claudication in peripheral vascular disease (HCC) 07/23/2020   Dehydration 07/2022   Diabetes (HCC)    Diabetic peripheral neuropathy (HCC) 01/31/2015   Dual implantable cardioverter-defibrillator in situ    01/16/2002 Dr. Carolynne Citron  RA lead  Guidant [id number] RV lead  Guidant [id number] Generator  Guidant Prism   09/02/2009 Generator change Medtronic [id number]  SN  [id number]      Erectile dysfunction    Erosive esophagitis    Essential hypertension 11/30/2015   Hemorrhoids    History of TIA (transient ischemic attack) 01/19/2019   HLD (hyperlipidemia)    HTN (hypertension)    Hyperlipidemia    Hypertensive heart disease without CHF 07/31/2011    ICD (implantable cardiac defibrillator) in place    ICD dual chamber in situ    Ischemic cardiomyopathy    EF 40% June 2013    Left-sided weakness 12/25/2018   Metabolic syndrome    Moderate nonproliferative diabetic retinopathy of both eyes without macular edema associated with type 2 diabetes mellitus (HCC) 02/20/2020   Morbid obesity (HCC)    OSA on CPAP 11/05/2014   Osteoarthritis    Posterior vitreous detachment of both eyes 02/20/2020   Presence of automatic (implantable) cardiac defibrillator 11/08/2017   S/P CABG (coronary artery bypass graft) 11/02/2000   Sleep apnea    Stroke-like symptoms 12/26/2018   Syncope 08/06/2009   Qualifier: Diagnosis of  By: Sallyanne Creamer CMA, Jewel     Type 2 diabetes, uncontrolled, with neuropathy    Has retinopahty and neuropathy     Ventricular tachycardia (paroxysmal) (HCC) 07/14/2016    Past Surgical History:  Procedure Laterality Date   ABDOMINAL EXPLORATION SURGERY     BACK SURGERY     X'3   cardiac bypass     CARDIAC DEFIBRILLATOR PLACEMENT     CARPAL TUNNEL RELEASE     X2, bilateral   CATARACT EXTRACTION     COLONOSCOPY  06/20/2012   Procedure: COLONOSCOPY;  Surgeon: Tessa Figures, MD;  Location: [facility];  Service: Endoscopy;  Laterality: N/A;   DOPPLER ECHOCARDIOGRAPHY  2003   EP IMPLANTABLE DEVICE N/A  07/14/2016   Procedure: ICD Generator Changeout;  Surgeon: Tammie Fall, MD;  Location: [facility] INVASIVE CV [facility];  Service: Cardiovascular;  Laterality: N/A;   ESOPHAGOGASTRODUODENOSCOPY  06/20/2012   Procedure: ESOPHAGOGASTRODUODENOSCOPY (EGD);  Surgeon: Tessa Figures, MD;  Location: [facility];  Service: Endoscopy;  Laterality: N/A;   EYE SURGERY     LAPAROTOMY     RETINOPATHY SURGERY Bilateral    rotator cuff surgery     left    Current Medications: Current Meds  Medication Sig   allopurinol  (ZYLOPRIM ) 300 MG tablet Take 0.5 tablets (150 mg total) by mouth daily. Take 1/2 (one-half) tablet by mouth once daily    benazepril  (LOTENSIN ) 40 MG tablet Take 1 tablet by mouth once daily   clopidogrel  (PLAVIX ) 75 MG tablet Take 1 tablet by mouth once daily   Cyanocobalamin (VITAMIN B 12 PO) Take 1,000 mcg by mouth daily.   dapagliflozin  propanediol (FARXIGA ) 10 MG TABS tablet Take 1 tablet (10 mg total) by mouth daily before breakfast.   Dulaglutide  (TRULICITY ) 1.5 MG/0.5ML SOAJ Inject 1.5 mg into the skin once a week.   DULoxetine  (CYMBALTA ) 60 MG capsule Take 1 capsule by mouth once daily   fluticasone  (CUTIVATE ) 0.05 % cream Apply 1 application. topically daily.   furosemide  (LASIX ) 40 MG tablet Take 1 tablet (40 mg total) by mouth 2 (two) times daily.   gabapentin  (NEURONTIN ) 300 MG capsule Take 1 capsule by mouth twice daily   Insulin  Glargine (BASAGLAR  KWIKPEN) 100 UNIT/ML Inject 26 Units into the skin 2 (two) times daily.   Insulin  Pen Needle (B-D UF III MINI PEN NEEDLES) 31G X 5 MM MISC 1 each by Does not apply route 4 (four) times daily.   insulin  regular (HUMULIN  R) 100 units/mL injection Inject 0.2 mLs (20 Units total) into the skin 3 (three) times daily before meals.   Insulin  Syringe-Needle U-100 (RELION INSULIN  SYRINGE 1ML/31G) 31G X 5/16" 1 ML MISC USE TO INJECT INSULIN  TWICE DAILY AS DIRECTED (Patient taking differently: 1 each by Other route 2 (two) times daily. USE TO INJECT INSULIN  TWICE DAILY AS DIRECTED)   metoprolol  succinate (TOPROL -XL) 50 MG 24 hr tablet TAKE 1 TABLET BY MOUTH ONCE DAILY WITH OR IMMEDIATELY FOLLOWING A MEAL (Patient taking differently: Take 50 mg by mouth daily. TAKE 1 TABLET BY MOUTH ONCE DAILY WITH OR IMMEDIATELY FOLLOWING A MEAL)   Multiple Vitamin (MULTIVITAMIN ADULT PO) Take 1 tablet by mouth daily.   nitroGLYCERIN  (NITROSTAT ) 0.4 MG SL tablet Place 1 tablet (0.4 mg total) under the tongue every 5 (five) minutes as needed for chest pain.   Omega-3 Fatty Acids (FISH OIL) 1000 MG CPDR Take 1 tablet by mouth daily.   polyethylene glycol powder (GLYCOLAX /MIRALAX ) powder  Take 17 g by mouth 2 (two) times daily as needed. (Patient taking differently: Take 17 g by mouth 2 (two) times daily as needed for mild constipation or moderate constipation.)   rosuvastatin  (CRESTOR ) 5 MG tablet Take 1 tablet by mouth once daily   tamsulosin  (FLOMAX ) 0.4 MG CAPS capsule Take 1 capsule (0.4 mg total) by mouth daily.   vitamin C (ASCORBIC ACID) 500 MG tablet Take 500 mg by mouth daily.     Allergies:   Patient has no known allergies.   Social History   Socioeconomic History   Marital status: Married    Spouse name: Alice   Number of children: 4   Years of education: 11   Highest education level: 12th grade  Occupational History  Occupation: Retired from [employer]: RETIRED  Tobacco Use   Smoking status: Never   Smokeless tobacco: Never  Vaping Use   Vaping status: Never Used  Substance and Sexual Activity   Alcohol use: No    Alcohol/week: 0.0 standard drinks of alcohol   Drug use: No   Sexual activity: Yes  Other Topics Concern   Not on file  Social History Narrative   Lives at home with wife   Right handed, Married, 4 kids from previous marriage.  Retired.     Caffeine 1 cup daily.   Social Drivers of Corporate investment banker Strain: Low Risk  (12/15/2023)   Overall Financial Resource Strain (CARDIA)    Difficulty of Paying Living Expenses: Not hard at all  Food Insecurity: No Food Insecurity (12/15/2023)   Hunger Vital Sign    Worried About Running Out of Food in the Last Year: Never true    Ran Out of Food in the Last Year: Never true  Transportation Needs: No Transportation Needs (12/15/2023)   PRAPARE - Administrator, Civil Service (Medical): No    Lack of Transportation (Non-Medical): No  Physical Activity: Insufficiently Active (12/15/2023)   Exercise Vital Sign    Days of Exercise per Week: 1 day    Minutes of Exercise per Session: 10 min  Stress: No Stress Concern Present (12/15/2023)   [employer] of  Occupational Health - Occupational Stress Questionnaire    Feeling of Stress : Only a little  Social Connections: Socially Integrated (12/15/2023)   Social Connection and Isolation Panel [NHANES]    Frequency of Communication with Friends and Family: More than three times a week    Frequency of Social Gatherings with Friends and Family: More than three times a week    Attends Religious Services: More than 4 times per year    Active Member of [employer] or Organizations: Yes    Attends Engineer, structural: More than 4 times per year    Marital Status: Married     Family History: The patient's family history includes Cancer in his brother; Diabetes in his brother, brother, brother, mother, sister, sister, sister, sister, and sister; Heart disease in his brother, father, and mother; Kidney disease in his sister; Throat cancer in his paternal uncle. ROS:   Please see the history of present illness.    All 14 point review of systems negative except as described per history of present illness  EKGs/Labs/Other Studies Reviewed:    EKG Interpretation Date/Time:  Friday Jan 21 2024 11:37:37 EDT Ventricular Rate:  60 PR Interval:  268 QRS Duration:  162 QT Interval:  500 QTC Calculation: 500 R Axis:   -39  Text Interpretation: Atrial-paced rhythm with prolonged AV conduction Left axis deviation Left bundle branch block When compared with ECG of 04-Nov-2023 12:21, Premature ventricular complexes are no longer Present Confirmed by Ralene Burger [phone]) on 01/21/2024 11:41:24 AM    Recent Labs: 11/04/2023: ALT 17; BUN 53; Hemoglobin 12.9; Platelets 167; Potassium 4.9; Sodium 138 11/22/2023: Creatinine, Ser 2.30  Recent Lipid Panel    Component Value Date/Time   CHOL 124 11/04/2023 0959   TRIG 146 11/04/2023 0959   HDL 40 11/04/2023 0959   CHOLHDL 3.1 11/04/2023 0959   CHOLHDL 3.4 12/26/2018 0558   VLDL 36 12/26/2018 0558   LDLCALC 59 11/04/2023 0959    Physical Exam:    VS:   BP 102/62 (BP Location: Right Arm, Patient Position:  Sitting)   Pulse 60   Ht 5\' 5"  (1.651 m)   Wt 222 lb (100.7 kg)   SpO2 97%   BMI 36.94 kg/m     Wt Readings from Last 3 Encounters:  01/21/24 222 lb (100.7 kg)  01/19/24 215 lb (97.5 kg)  12/15/23 221 lb (100.2 kg)     GEN:  Well nourished, well developed in no acute distress HEENT: Normal NECK: No JVD; No carotid bruits LYMPHATICS: No lymphadenopathy CARDIAC: RRR, no murmurs, no rubs, no gallops RESPIRATORY:  Clear to auscultation without rales, wheezing or rhonchi  ABDOMEN: Soft, non-tender, non-distended MUSCULOSKELETAL:  No edema; No deformity  SKIN: Warm and dry LOWER EXTREMITIES: no swelling NEUROLOGIC:  Alert and oriented x 3 PSYCHIATRIC:  Normal affect   ASSESSMENT:    1. Essential hypertension   2. Coronary artery disease involving native coronary artery of native heart without angina pectoris   3. Chronic systolic heart failure (HCC)   4. Diabetes mellitus treated with insulin  and oral medication (HCC)   5. Dual implantable cardioverter-defibrillator in situ    PLAN:    In order of problems listed above:  Essential hypertension blood pressure well-controlled continue present management. Coronary disease stable with appropriate guideline directed medical therapy. Chronic systolic congestive heart failure.  Stable from that point review. Diabetes mellitus doing well ICD present I did review interrogation of the device at the beginning of December until February he did have elevation of OptiVol but lately has been normalizing clinically he is hemodynamically stable and compensated   Medication Adjustments/Labs and Tests Ordered: Current medicines are reviewed at length with the patient today.  Concerns regarding medicines are outlined above.  Orders Placed This Encounter  Procedures   EKG 12-Lead   Medication changes: No orders of the defined types were placed in this encounter.   Signed, Manfred Seed, MD, [facility] 01/21/2024 12:08 PM    [facility]

## 2024-01-24 NOTE — Progress Notes (Unsigned)
 PATIENT: Manuel Kline DOB: 1932/04/24  REASON FOR VISIT: follow up HISTORY FROM: patient  No chief complaint on file.    HISTORY OF PRESENT ILLNESS:  01/24/24 ALL:  Manuel Kline returns for follow up for OSA on CPAP. He continues to do well with therapy.     01/20/2023 ALL: Manuel Kline returns for follow up for OSA on CPAP. He continues to do well with therapy. He is using CPAP nightly for about 7 hours. He denies concerns with machine or supplies. His son reports they were not changing supplies regularly but now seem to be doing better. He does have a leak but not bothered by this and AHI well managed. He continues close follow up with PCP. He has had a couple falls. Using cane. Now working with PT. He continues Plavix  and rosuvastatin .     01/14/2022 ALL: Manuel Kline is a 89 y.o. male here today for follow up for OSA on CPAP.  He was seen by Dr Albertina Hugger 10/2021 needing a new CPAP. HST 11/03/2021 confirmed severe OSA with total AHI 42.2/hr, REM AHI 72.5/hr with hypoxia noted. AutoPAP was ordered. He reports doing very well on CPAP. He has adjusted to new machine. He is using therapy about 7.5 hours every night. He reports that he sleeps very well. No concerns with machine or supplies.   He is followed closely by PCP for stroke prevention. He continues Plavix  and rosuvastatin .     HISTORY: (copied from Dr Dohmeier's previous note)  Manuel Kline is a 89 y.o. year old White or Caucasian male patient seen here as a referral on 10/21/2021 from Dr Steen Eden, MD.    Chief concern according to patient :  Rm 10, w daughter Abreu. Internal referral for OSA. Last SS done in 2015. Pt is severely hearing impaired, sleeps more than usual, has neuropathy-pacemaker, CKD , here to re visit since last visit with Dr. Janett Medin in 2020 for TIA and a small stroke, incoherent, facial droop.   Pt has been using his CPAP nightly but seems to not be working properly. Pt here to get a new CPAP is possible  DME [facility].   The patient  had the first sleep study in the year 2007.   Note from 07-2014: [name] was last seen 7-1/2 years ago in my practice, at that time a patient of Dr Teryl Fields.  At the time he carried the diagnosis of a complicated or so-called complex sleep apnea syndrome. The patient was placed on a VPAP because of his cardiac history including cardiomyopathy, coronary artery disease, congestive heart failure and he has a defibrillator implanted. His residual apnea index was 7.5 his sleep study in 2007 diagnosed him with an AHI of 77 apneas per hour of sleep. The patient had meanwhile felt that the machine did not longer benefit him or that it actually multifunctioned. He has not been using it since 2012, but never contacted us  or the DME. He is here today to see if he actually still needs apnea treatment and if his condition is still present.  He is here today to find out  what the best treatment would be should be still find apnea to be present.   Sleep relevant medical history: [name] has used his most recent CPAP continuously.  He is now "reading it up" and he needs desperately either a new machine or at least new supplies.  I understand that his machine was prescribed in 2015 so it is by now  11[birth year] years old and it would need to be replaced in any case.  Our possibilities of testing however are quite different from the time when we first met.  We can do a home sleep test to simply confirm the presence of sleep apnea and then based on that order a CPAP device for him.  If he has complex sleep apnea so-called central sleep apnea we would still prefer for the patient to come to the sleep lab to be titrated as some patients get worse on the CPAP.   Family medical /sleep history:  daughter and son on CPAP with OSA, had 9 siblings, 2 brothers had OSA.    Social history:  Patient is retired from  plumbing, and lives in a household with spouse, and a daughter lives close by, ex son in law is helping.  Meal on wheels,  daughter sets pill boxes.   Family status is married , with adult children, and grandchildren.  Active churchgoer,  Tobacco use never .  ETOH use none, Caffeine intake in form of Coffee( 1 in am ) Hobbies :[employer]  Sleep habits are as follows: The patient's dinner time is between 5-6 PM. He snacks later- high carb diet- The patient goes to bed at 11 PM and continues to sleep for 6-7 hours, he kicks and moves all the  time- wakes for 1-2 bathroom breaks.  He reports foot pain and jerking in daytime, and while resting in his recliner.  The preferred sleep position is supine and on his side , with the support of 2 pillows. Head of bed elevated. Dreams are reportedly frequent.  7  AM is the usual rise time. The patient wakes up spontaneously/- he has a "breakfast cook" coming to his home.  He reports not feeling refreshed or restored in AM, with symptoms such as dry mouth, leg pain. Naps are taken frequently, lasting from 10 to 30 minutes and are more refreshing than nocturnal sleep.    REVIEW OF SYSTEMS: Out of a complete 14 system review of symptoms, the patient complains only of the following symptoms, joint pain, falls and all other reviewed systems are negative.  ESS: 12/24  ALLERGIES: No Known Allergies  HOME MEDICATIONS: Outpatient Medications Prior to Visit  Medication Sig Dispense Refill   allopurinol  (ZYLOPRIM ) 300 MG tablet Take 0.5 tablets (150 mg total) by mouth daily. Take 1/2 (one-half) tablet by mouth once daily 45 tablet 3   benazepril  (LOTENSIN ) 40 MG tablet Take 1 tablet by mouth once daily 90 tablet 1   clopidogrel  (PLAVIX ) 75 MG tablet Take 1 tablet by mouth once daily 90 tablet 0   Cyanocobalamin (VITAMIN B 12 PO) Take 1,000 mcg by mouth daily.     dapagliflozin  propanediol (FARXIGA ) 10 MG TABS tablet Take 1 tablet (10 mg total) by mouth daily before breakfast. 90 tablet 5   Dulaglutide  (TRULICITY ) 1.5 MG/0.5ML SOAJ Inject 1.5 mg into the skin once a week. 6 mL 5    DULoxetine  (CYMBALTA ) 60 MG capsule Take 1 capsule by mouth once daily 90 capsule 0   fluticasone  (CUTIVATE ) 0.05 % cream Apply 1 application. topically daily.     furosemide  (LASIX ) 40 MG tablet Take 1 tablet (40 mg total) by mouth 2 (two) times daily. 180 tablet 3   gabapentin  (NEURONTIN ) 300 MG capsule Take 1 capsule by mouth twice daily 180 capsule 0   Insulin  Glargine (BASAGLAR  KWIKPEN) 100 UNIT/ML Inject 26 Units into the skin 2 (two) times daily. 46.8 mL 3  Insulin  Pen Needle (B-D UF III MINI PEN NEEDLES) 31G X 5 MM MISC 1 each by Does not apply route 4 (four) times daily. 120 each 11   insulin  regular (HUMULIN  R) 100 units/mL injection Inject 0.2 mLs (20 Units total) into the skin 3 (three) times daily before meals. 60 mL 3   Insulin  Syringe-Needle U-100 (RELION INSULIN  SYRINGE 1ML/31G) 31G X 5/16" 1 ML MISC USE TO INJECT INSULIN  TWICE DAILY AS DIRECTED (Patient taking differently: 1 each by Other route 2 (two) times daily. USE TO INJECT INSULIN  TWICE DAILY AS DIRECTED) 200 each 10   metoprolol  succinate (TOPROL -XL) 50 MG 24 hr tablet TAKE 1 TABLET BY MOUTH ONCE DAILY WITH OR IMMEDIATELY FOLLOWING A MEAL (Patient taking differently: Take 50 mg by mouth daily. TAKE 1 TABLET BY MOUTH ONCE DAILY WITH OR IMMEDIATELY FOLLOWING A MEAL) 90 tablet 0   Multiple Vitamin (MULTIVITAMIN ADULT PO) Take 1 tablet by mouth daily.     nitroGLYCERIN  (NITROSTAT ) 0.4 MG SL tablet Place 1 tablet (0.4 mg total) under the tongue every 5 (five) minutes as needed for chest pain. 25 tablet 0   Omega-3 Fatty Acids (FISH OIL) 1000 MG CPDR Take 1 tablet by mouth daily.     polyethylene glycol powder (GLYCOLAX /MIRALAX ) powder Take 17 g by mouth 2 (two) times daily as needed. (Patient taking differently: Take 17 g by mouth 2 (two) times daily as needed for mild constipation or moderate constipation.) 3350 g 1   rosuvastatin  (CRESTOR ) 5 MG tablet Take 1 tablet by mouth once daily 90 tablet 1   tamsulosin  (FLOMAX ) 0.4 MG CAPS  capsule Take 1 capsule (0.4 mg total) by mouth daily. 90 capsule 3   vitamin C (ASCORBIC ACID) 500 MG tablet Take 500 mg by mouth daily.     No facility-administered medications prior to visit.    PAST MEDICAL HISTORY: Past Medical History:  Diagnosis Date   Adenomatous colon polyp 2006   CAD (coronary artery disease)    Calcium  oxalate renal stones    Cardiomyopathy    Cataract    Cataract    Cerebral embolism with cerebral infarction 12/26/2018   Chronic systolic heart failure (HCC)         CKD (chronic kidney disease) stage 4, GFR 15-29 ml/min (HCC) 11/08/2017   Claudication in peripheral vascular disease (HCC) 07/23/2020   Dehydration 07/2022   Diabetes (HCC)    Diabetic peripheral neuropathy (HCC) 01/31/2015   Dual implantable cardioverter-defibrillator in situ    01/16/2002 Dr. Carolynne Citron  RA lead  Guidant [id number] RV lead  Guidant [id number] Generator  Guidant Prism   09/02/2009 Generator change Medtronic [id number]  SN  [id number]      Erectile dysfunction    Erosive esophagitis    Essential hypertension 11/30/2015   Hemorrhoids    History of TIA (transient ischemic attack) 01/19/2019   HLD (hyperlipidemia)    HTN (hypertension)    Hyperlipidemia    Hypertensive heart disease without CHF 07/31/2011   ICD (implantable cardiac defibrillator) in place    ICD dual chamber in situ    Ischemic cardiomyopathy    EF 40% June 2013    Left-sided weakness 12/25/2018   Metabolic syndrome    Moderate nonproliferative diabetic retinopathy of both eyes without macular edema associated with type 2 diabetes mellitus (HCC) 02/20/2020   Morbid obesity (HCC)    OSA on CPAP 11/05/2014   Osteoarthritis    Posterior vitreous detachment of both eyes 02/20/2020   Presence of automatic (  implantable) cardiac defibrillator 11/08/2017   S/P CABG (coronary artery bypass graft) 11/02/2000   Sleep apnea    Stroke-like symptoms 12/26/2018   Syncope 08/06/2009   Qualifier: Diagnosis of  By: Sallyanne Creamer  CMA, Jewel     Type 2 diabetes, uncontrolled, with neuropathy    Has retinopahty and neuropathy     Ventricular tachycardia (paroxysmal) (HCC) 07/14/2016    PAST SURGICAL HISTORY: Past Surgical History:  Procedure Laterality Date   ABDOMINAL EXPLORATION SURGERY     BACK SURGERY     X'3   cardiac bypass     CARDIAC DEFIBRILLATOR PLACEMENT     CARPAL TUNNEL RELEASE     X2, bilateral   CATARACT EXTRACTION     COLONOSCOPY  06/20/2012   Procedure: COLONOSCOPY;  Surgeon: Tessa Figures, MD;  Location: [facility];  Service: Endoscopy;  Laterality: N/A;   DOPPLER ECHOCARDIOGRAPHY  2003   EP IMPLANTABLE DEVICE N/A 07/14/2016   Procedure: ICD Generator Changeout;  Surgeon: Tammie Fall, MD;  Location: [facility] INVASIVE CV LAB;  Service: Cardiovascular;  Laterality: N/A;   ESOPHAGOGASTRODUODENOSCOPY  06/20/2012   Procedure: ESOPHAGOGASTRODUODENOSCOPY (EGD);  Surgeon: Tessa Figures, MD;  Location: [facility];  Service: Endoscopy;  Laterality: N/A;   EYE SURGERY     LAPAROTOMY     RETINOPATHY SURGERY Bilateral    rotator cuff surgery     left    FAMILY HISTORY: Family History  Problem Relation Age of Onset   Diabetes Brother    Heart disease Father    Heart disease Mother    Diabetes Mother    Diabetes Brother    Diabetes Brother    Cancer Brother        baldder   Heart disease Brother    Diabetes Sister    Diabetes Sister    Kidney disease Sister        dialysis   Diabetes Sister    Diabetes Sister    Diabetes Sister    Throat cancer Paternal Uncle     SOCIAL HISTORY: Social History   Socioeconomic History   Marital status: Married    Spouse name: Alice   Number of children: 4   Years of education: 11   Highest education level: 12th grade  Occupational History   Occupation: Retired from [employer] business    Employer: RETIRED  Tobacco Use   Smoking status: Never   Smokeless tobacco: Never  Vaping Use   Vaping status: Never Used  Substance and Sexual  Activity   Alcohol use: No    Alcohol/week: 0.0 standard drinks of alcohol   Drug use: No   Sexual activity: Yes  Other Topics Concern   Not on file  Social History Narrative   Lives at home with wife   Right handed, Married, 4 kids from previous marriage.  Retired.     Caffeine 1 cup daily.   Social Drivers of Corporate investment banker Strain: Low Risk  (12/15/2023)   Overall Financial Resource Strain (CARDIA)    Difficulty of Paying Living Expenses: Not hard at all  Food Insecurity: No Food Insecurity (12/15/2023)   Hunger Vital Sign    Worried About Running Out of Food in the Last Year: Never true    Ran Out of Food in the Last Year: Never true  Transportation Needs: No Transportation Needs (12/15/2023)   PRAPARE - Administrator, Civil Service (Medical): No    Lack of Transportation (Non-Medical): No  Physical Activity: Insufficiently  Active (12/15/2023)   Exercise Vital Sign    Days of Exercise per Week: 1 day    Minutes of Exercise per Session: 10 min  Stress: No Stress Concern Present (12/15/2023)   [employer] of Occupational Health - Occupational Stress Questionnaire    Feeling of Stress : Only a little  Social Connections: Socially Integrated (12/15/2023)   Social Connection and Isolation Panel [NHANES]    Frequency of Communication with Friends and Family: More than three times a week    Frequency of Social Gatherings with Friends and Family: More than three times a week    Attends Religious Services: More than 4 times per year    Active Member of [employer] or Organizations: Yes    Attends Engineer, structural: More than 4 times per year    Marital Status: Married  Catering manager Violence: Not At Risk (12/15/2023)   Humiliation, Afraid, Rape, and Kick questionnaire    Fear of Current or Ex-Partner: No    Emotionally Abused: No    Physically Abused: No    Sexually Abused: No     PHYSICAL EXAM  There were no vitals filed for this  visit.   There is no height or weight on file to calculate BMI.  Generalized: Well developed, in no acute distress  Cardiology: normal rate and rhythm, no murmur noted, pacemaker noted  Respiratory: clear to auscultation bilaterally  Neurological examination  Mentation: Alert oriented to time, place, history taking. Follows all commands speech and language fluent Cranial nerve II-XII: Pupils were equal round reactive to light. Extraocular movements were full, visual field were full  Motor: The motor testing reveals 5 over 5 strength of all 4 extremities. Good symmetric motor tone is noted throughout.  Gait and station: Gait is wide, stable with cane    DIAGNOSTIC DATA (LABS, IMAGING, TESTING) - I reviewed patient records, labs, notes, testing and imaging myself where available.     03/23/2018   11:07 AM 03/18/2017    8:36 AM 03/16/2016    2:01 PM  MMSE - Mini Mental State Exam  Orientation to time 5 5 5   Orientation to Place 5 5 5   Registration 3 3 3   Attention/ Calculation 0 3 4  Attention/Calculation-comments not attempted    Recall 2 3 3   Language- name 2 objects 2 2 2   Language- repeat 1 1 1   Language- follow 3 step command 3 3 3   Language- read & follow direction 1 1 1   Write a sentence 1 1 1   Copy design 1 1 1   Total score 24 28 29      Lab Results  Component Value Date   WBC 7.4 11/04/2023   HGB 12.9 (L) 11/04/2023   HCT 38.7 11/04/2023   MCV 88 11/04/2023   PLT 167 11/04/2023      Component Value Date/Time   NA 138 11/04/2023 0959   K 4.9 11/04/2023 0959   CL 101 11/04/2023 0959   CO2 23 11/04/2023 0959   GLUCOSE 147 (H) 11/04/2023 0959   GLUCOSE 93 07/22/2022 0312   BUN 53 (H) 11/04/2023 0959   CREATININE 2.30 (H) 11/22/2023 1321   CREATININE 2.04 (H) 07/03/2016 1028   CALCIUM  9.3 11/04/2023 0959   PROT 6.8 11/04/2023 0959   ALBUMIN 4.3 11/04/2023 0959   AST 25 11/04/2023 0959   ALT 17 11/04/2023 0959   ALKPHOS 119 11/04/2023 0959   BILITOT 0.5  11/04/2023 0959   GFRNONAA 20 (L) 07/22/2022 0981   GFRAA  43 (L) 05/22/2020 1142   Lab Results  Component Value Date   CHOL 124 11/04/2023   HDL 40 11/04/2023   LDLCALC 59 11/04/2023   TRIG 146 11/04/2023   CHOLHDL 3.1 11/04/2023   Lab Results  Component Value Date   HGBA1C 7.3 (H) 11/04/2023   Lab Results  Component Value Date   VITAMINB12 556 12/25/2018   Lab Results  Component Value Date   TSH 1.421 12/25/2018     ASSESSMENT AND PLAN 89 y.o. year old male  has a past medical history of Adenomatous colon polyp (2006), CAD (coronary artery disease), Calcium  oxalate renal stones, Cardiomyopathy, Cataract, Cataract, Cerebral embolism with cerebral infarction (12/26/2018), Chronic systolic heart failure (HCC), CKD (chronic kidney disease) stage 4, GFR 15-29 ml/min (HCC) (11/08/2017), Claudication in peripheral vascular disease (HCC) (07/23/2020), Dehydration (07/2022), Diabetes (HCC), Diabetic peripheral neuropathy (HCC) (01/31/2015), Dual implantable cardioverter-defibrillator in situ, Erectile dysfunction, Erosive esophagitis, Essential hypertension (11/30/2015), Hemorrhoids, History of TIA (transient ischemic attack) (01/19/2019), HLD (hyperlipidemia), HTN (hypertension), Hyperlipidemia, Hypertensive heart disease without CHF (07/31/2011), ICD (implantable cardiac defibrillator) in place, ICD dual chamber in situ, Ischemic cardiomyopathy, Left-sided weakness (12/25/2018), Metabolic syndrome, Moderate nonproliferative diabetic retinopathy of both eyes without macular edema associated with type 2 diabetes mellitus (HCC) (02/20/2020), Morbid obesity (HCC), OSA on CPAP (11/05/2014), Osteoarthritis, Posterior vitreous detachment of both eyes (02/20/2020), Presence of automatic (implantable) cardiac defibrillator (11/08/2017), S/P CABG (coronary artery bypass graft) (11/02/2000), Sleep apnea, Stroke-like symptoms (12/26/2018), Syncope (08/06/2009), Type 2 diabetes, uncontrolled, with  neuropathy, and Ventricular tachycardia (paroxysmal) (HCC) (07/14/2016). here with   No diagnosis found.      Kimberley Penman is doing well on CPAP therapy. Compliance report reveals excellent compliance. He does have an air leak but AHI is very well managed. He will continue to monitor for worsening leak. He was encouraged to continue using CPAP nightly and for greater than 4 hours each night. We will update supply orders as indicated. Risks of untreated sleep apnea review and education materials provided. He was encouraged to continue close follow up with PCP and care team. Stroke prevention education provided. Fall prevention advised. Healthy lifestyle habits encouraged. He will follow up in 1 year, sooner if needed. He verbalizes understanding and agreement with this plan.    No orders of the defined types were placed in this encounter.    No orders of the defined types were placed in this encounter.     Jayson Michael 01/24/2024, 6:29 PM [facility] [address] [place], [place] [address] [phone]

## 2024-01-24 NOTE — Patient Instructions (Signed)

## 2024-01-25 ENCOUNTER — Encounter: Payer: Self-pay | Admitting: Family Medicine

## 2024-01-25 ENCOUNTER — Ambulatory Visit: Payer: Medicare Other | Admitting: Family Medicine

## 2024-01-25 VITALS — BP 131/69 | HR 70 | Ht 65.0 in | Wt 228.0 lb

## 2024-01-25 DIAGNOSIS — G4733 Obstructive sleep apnea (adult) (pediatric): Secondary | ICD-10-CM | POA: Diagnosis not present

## 2024-01-26 NOTE — Progress Notes (Signed)
 Community message sent to [employer] that order placed.

## 2024-01-31 ENCOUNTER — Other Ambulatory Visit: Payer: Self-pay | Admitting: Family Medicine

## 2024-01-31 DIAGNOSIS — E1169 Type 2 diabetes mellitus with other specified complication: Secondary | ICD-10-CM

## 2024-02-04 ENCOUNTER — Ambulatory Visit: Admitting: Family Medicine

## 2024-02-04 ENCOUNTER — Encounter: Payer: Self-pay | Admitting: Family Medicine

## 2024-02-04 VITALS — BP 94/60 | HR 82 | Ht 65.0 in | Wt 227.0 lb

## 2024-02-04 DIAGNOSIS — N184 Chronic kidney disease, stage 4 (severe): Secondary | ICD-10-CM | POA: Diagnosis not present

## 2024-02-04 DIAGNOSIS — E1142 Type 2 diabetes mellitus with diabetic polyneuropathy: Secondary | ICD-10-CM | POA: Diagnosis not present

## 2024-02-04 DIAGNOSIS — E782 Mixed hyperlipidemia: Secondary | ICD-10-CM | POA: Diagnosis not present

## 2024-02-04 DIAGNOSIS — Z794 Long term (current) use of insulin: Secondary | ICD-10-CM | POA: Diagnosis not present

## 2024-02-04 DIAGNOSIS — E1169 Type 2 diabetes mellitus with other specified complication: Secondary | ICD-10-CM

## 2024-02-04 DIAGNOSIS — E113393 Type 2 diabetes mellitus with moderate nonproliferative diabetic retinopathy without macular edema, bilateral: Secondary | ICD-10-CM

## 2024-02-04 DIAGNOSIS — I10 Essential (primary) hypertension: Secondary | ICD-10-CM

## 2024-02-04 LAB — LIPID PANEL

## 2024-02-04 LAB — BAYER DCA HB A1C WAIVED: HB A1C (BAYER DCA - WAIVED): 7.7 % — ABNORMAL HIGH (ref 4.8–5.6)

## 2024-02-04 MED ORDER — INSULIN SYRINGE-NEEDLE U-100 31G X 5/16" 1 ML MISC
1.0000 | Freq: Three times a day (TID) | 10 refills | Status: AC
Start: 2024-02-04 — End: ?

## 2024-02-04 NOTE — Addendum Note (Signed)
 Addended by: Jolyne Needs on: 02/04/2024 11:31 AM   Modules accepted: Orders

## 2024-02-04 NOTE — Progress Notes (Signed)
 BP 94/60   Pulse 82   Ht 5\' 5"  (1.651 m)   Wt 227 lb (103 kg)   SpO2 92%   BMI 37.77 kg/m    Subjective:   Patient ID: Manuel Kline, male    DOB: 10-Jun-1932, 89 y.o.   MRN: [id number]  HPI: Manuel Kline is a 89 y.o. male presenting on 02/04/2024 for Medical Management of Chronic Issues and Diabetes   HPI Type 2 diabetes mellitus Patient comes in today for recheck of his diabetes. Patient has been currently taking Basaglar  and Trulicity  and Farxiga . Patient is currently on an ACE inhibitor/ARB. Patient has not seen an ophthalmologist this year. Patient denies any new issues with their feet. The symptom started onset as an adult hypertension and hyperlipidemia ARE RELATED TO DM   Hyperlipidemia Patient is coming in for recheck of his hyperlipidemia. The patient is currently taking fish oils and Crestor . They deny any issues with myalgias or history of liver damage from it. They deny any focal numbness or weakness or chest pain.   Hypertension Patient is currently on benazepril  and furosemide  and metoprolol , and their blood pressure today is 94/60. Patient denies any lightheadedness or dizziness. Patient denies headaches, blurred vision, chest pains, shortness of breath, or weakness. Denies any side effects from medication and is content with current medication.   Relevant past medical, surgical, family and social history reviewed and updated as indicated. Interim medical history since our last visit reviewed. Allergies and medications reviewed and updated.  Review of Systems  Constitutional:  Negative for chills and fever.  Eyes:  Negative for discharge.  Respiratory:  Negative for shortness of breath and wheezing.   Cardiovascular:  Negative for chest pain and leg swelling.  Musculoskeletal:  Negative for back pain and gait problem.  Skin:  Negative for rash.  Neurological:  Negative for dizziness and light-headedness.  All other systems reviewed and are negative.   Per HPI  unless specifically indicated above   Allergies as of 02/04/2024   No Known Allergies      Medication List        Accurate as of February 04, 2024 11:26 AM. If you have any questions, ask your nurse or doctor.          allopurinol  300 MG tablet Commonly known as: ZYLOPRIM  Take 0.5 tablets (150 mg total) by mouth daily. Take 1/2 (one-half) tablet by mouth once daily   ascorbic acid 500 MG tablet Commonly known as: VITAMIN C Take 500 mg by mouth daily.   B-D UF III MINI PEN NEEDLES 31G X 5 MM Misc Generic drug: Insulin  Pen Needle 1 each by Does not apply route 4 (four) times daily.   Basaglar  KwikPen 100 UNIT/ML Inject 26 Units into the skin 2 (two) times daily.   benazepril  40 MG tablet Commonly known as: LOTENSIN  Take 1 tablet by mouth once daily   clopidogrel  75 MG tablet Commonly known as: PLAVIX  Take 1 tablet by mouth once daily   dapagliflozin  propanediol 10 MG Tabs tablet Commonly known as: Farxiga  Take 1 tablet (10 mg total) by mouth daily before breakfast.   DULoxetine  60 MG capsule Commonly known as: CYMBALTA  Take 1 capsule by mouth once daily   Fish Oil 1000 MG Cpdr Take 1 tablet by mouth daily.   fluticasone  0.05 % cream Commonly known as: CUTIVATE  Apply 1 application. topically daily.   furosemide  40 MG tablet Commonly known as: LASIX  Take 1 tablet (40 mg total) by mouth 2 (  two) times daily.   gabapentin  300 MG capsule Commonly known as: NEURONTIN  Take 1 capsule by mouth twice daily   insulin  regular 100 units/mL injection Commonly known as: HumuLIN  R Inject 0.2 mLs (20 Units total) into the skin 3 (three) times daily before meals.   Insulin  Syringe-Needle U-100 31G X 5/16" 1 ML Misc Commonly known as: RELION INSULIN  SYRINGE 1ML/31G USE TO INJECT INSULIN  TWICE DAILY AS DIRECTED What changed:  how much to take how to take this when to take this   metoprolol  succinate 50 MG 24 hr tablet Commonly known as: TOPROL -XL TAKE 1 TABLET BY MOUTH  ONCE DAILY WITH OR IMMEDIATELY FOLLOWING A MEAL What changed: See the new instructions.   MULTIVITAMIN ADULT PO Take 1 tablet by mouth daily.   nitroGLYCERIN  0.4 MG SL tablet Commonly known as: NITROSTAT  Place 1 tablet (0.4 mg total) under the tongue every 5 (five) minutes as needed for chest pain.   polyethylene glycol powder 17 GM/SCOOP powder Commonly known as: GLYCOLAX /MIRALAX  Take 17 g by mouth 2 (two) times daily as needed. What changed: reasons to take this   rosuvastatin  5 MG tablet Commonly known as: CRESTOR  Take 1 tablet by mouth once daily   tamsulosin  0.4 MG Caps capsule Commonly known as: FLOMAX  Take 1 capsule (0.4 mg total) by mouth daily.   Trulicity  1.5 MG/0.5ML Soaj Generic drug: Dulaglutide  INJECT 1.5 MG (0.5ML) UNDER THE SKIN ONCE A WEEK   VITAMIN B 12 PO Take 1,000 mcg by mouth daily.         Objective:   BP 94/60   Pulse 82   Ht 5\' 5"  (1.651 m)   Wt 227 lb (103 kg)   SpO2 92%   BMI 37.77 kg/m   Wt Readings from Last 3 Encounters:  02/04/24 227 lb (103 kg)  01/25/24 228 lb (103.4 kg)  01/21/24 222 lb (100.7 kg)    Physical Exam Vitals and nursing note reviewed.  Constitutional:      General: He is not in acute distress.    Appearance: He is well-developed. He is not diaphoretic.  Eyes:     General: No scleral icterus.    Conjunctiva/sclera: Conjunctivae normal.  Neck:     Thyroid : No thyromegaly.  Cardiovascular:     Rate and Rhythm: Normal rate and regular rhythm.     Heart sounds: Normal heart sounds. No murmur heard. Pulmonary:     Effort: Pulmonary effort is normal. No respiratory distress.     Breath sounds: Normal breath sounds. No wheezing.  Musculoskeletal:        General: No swelling. Normal range of motion.     Cervical back: Neck supple.  Lymphadenopathy:     Cervical: No cervical adenopathy.  Skin:    General: Skin is warm and dry.     Findings: No rash.  Neurological:     Mental Status: He is alert and oriented  to person, place, and time.     Coordination: Coordination normal.  Psychiatric:        Behavior: Behavior normal.       Assessment & Plan:   Problem List Items Addressed This Visit       Cardiovascular and Mediastinum   Essential hypertension   Relevant Orders   Bayer DCA Hb A1c Waived   CBC with Differential/Platelet   CMP14+EGFR   Lipid panel     Endocrine   Type 2 diabetes mellitus with other specified complication (HCC)   Relevant Orders   Bayer  DCA Hb A1c Waived   CBC with Differential/Platelet   CMP14+EGFR   Lipid panel   Moderate nonproliferative diabetic retinopathy of both eyes without macular edema associated with type 2 diabetes mellitus (HCC)     Genitourinary   CKD (chronic kidney disease) stage 4, GFR 15-29 ml/min (HCC) - Primary   Relevant Orders   Bayer DCA Hb A1c Waived   CBC with Differential/Platelet   CMP14+EGFR   Lipid panel     Other   Hyperlipidemia (Chronic)   Relevant Orders   Bayer DCA Hb A1c Waived   CBC with Differential/Platelet   CMP14+EGFR   Lipid panel    A1c is 7.7 which is improved but still elevated, not going to change his medicines, they recently had some cakes and some parties and staff and they are going to refocus on not having as much sweets here in the near future. Follow up plan: Return in about 3 months (around 05/06/2024), or if symptoms worsen or fail to improve, for Diabetes and hypertension and CKD and hyperlipidemia.  Counseling provided for all of the vaccine components Orders Placed This Encounter  Procedures   Bayer DCA Hb A1c Waived   CBC with Differential/Platelet   CMP14+EGFR   Lipid panel    Jolyne Needs, MD [facility] 02/04/2024, 11:26 AM

## 2024-02-05 LAB — CBC WITH DIFFERENTIAL/PLATELET
Basophils Absolute: 0 10*3/uL (ref 0.0–0.2)
Basos: 0 %
EOS (ABSOLUTE): 0.2 10*3/uL (ref 0.0–0.4)
Eos: 3 %
Hematocrit: 38.7 % (ref 37.5–51.0)
Hemoglobin: 12.5 g/dL — ABNORMAL LOW (ref 13.0–17.7)
Immature Grans (Abs): 0 10*3/uL (ref 0.0–0.1)
Immature Granulocytes: 0 %
Lymphocytes Absolute: 1.9 10*3/uL (ref 0.7–3.1)
Lymphs: 28 %
MCH: 29.5 pg (ref 26.6–33.0)
MCHC: 32.3 g/dL (ref 31.5–35.7)
MCV: 91 fL (ref 79–97)
Monocytes Absolute: 0.6 10*3/uL (ref 0.1–0.9)
Monocytes: 8 %
Neutrophils Absolute: 4.1 10*3/uL (ref 1.4–7.0)
Neutrophils: 61 %
Platelets: 170 10*3/uL (ref 150–450)
RBC: 4.24 x10E6/uL (ref 4.14–5.80)
RDW: 14.5 % (ref 11.6–15.4)
WBC: 6.8 10*3/uL (ref 3.4–10.8)

## 2024-02-05 LAB — CMP14+EGFR
ALT: 18 IU/L (ref 0–44)
AST: 21 IU/L (ref 0–40)
Albumin: 4.1 g/dL (ref 3.6–4.6)
Alkaline Phosphatase: 120 IU/L (ref 44–121)
BUN/Creatinine Ratio: 18 (ref 10–24)
BUN: 43 mg/dL — ABNORMAL HIGH (ref 10–36)
Bilirubin Total: 0.5 mg/dL (ref 0.0–1.2)
CO2: 20 mmol/L (ref 20–29)
Calcium: 8.8 mg/dL (ref 8.6–10.2)
Chloride: 102 mmol/L (ref 96–106)
Creatinine, Ser: 2.34 mg/dL — ABNORMAL HIGH (ref 0.76–1.27)
Globulin, Total: 2.2 g/dL (ref 1.5–4.5)
Glucose: 212 mg/dL — ABNORMAL HIGH (ref 70–99)
Potassium: 4.7 mmol/L (ref 3.5–5.2)
Sodium: 138 mmol/L (ref 134–144)
Total Protein: 6.3 g/dL (ref 6.0–8.5)
eGFR: 26 mL/min/{1.73_m2} — ABNORMAL LOW (ref >59)

## 2024-02-05 LAB — LIPID PANEL
Chol/HDL Ratio: 3.6 ratio (ref 0.0–5.0)
Cholesterol, Total: 127 mg/dL (ref 100–199)
HDL: 35 mg/dL — ABNORMAL LOW (ref >39)
LDL Chol Calc (NIH): 56 mg/dL (ref 0–99)
Triglycerides: 220 mg/dL — ABNORMAL HIGH (ref 0–149)
VLDL Cholesterol Cal: 36 mg/dL (ref 5–40)

## 2024-02-10 ENCOUNTER — Ambulatory Visit: Payer: Self-pay | Admitting: Family Medicine

## 2024-02-24 ENCOUNTER — Ambulatory Visit (INDEPENDENT_AMBULATORY_CARE_PROVIDER_SITE_OTHER): Admitting: Otolaryngology

## 2024-02-24 VITALS — BP 125/73 | HR 86

## 2024-02-24 DIAGNOSIS — D119 Benign neoplasm of major salivary gland, unspecified: Secondary | ICD-10-CM

## 2024-02-24 DIAGNOSIS — H903 Sensorineural hearing loss, bilateral: Secondary | ICD-10-CM

## 2024-02-24 DIAGNOSIS — K118 Other diseases of salivary glands: Secondary | ICD-10-CM

## 2024-02-24 NOTE — Progress Notes (Signed)
 ENT Progress Note:   Update 02/24/2024  Manuel Kline is a 89 year old male who presents for f/u of left parotid masses. He had FNA which was c/w benign Warthin's tumor.    Recently, the lesion has been increasing in size, which is concerning to him. No facial movement issues. It feels larger and more noticeable over time.  Has a hx of hearing loss, which was due to auditory nerve damage based on report and prior hearing tests. The patient did not bring his old hearing test results unfortunately. After benign result on FNA, they cancelled their appointment at [facility].    Records Reviewed:  Initial Evaluation  Reason for Consult: left parotid masses   HPI: Discussed the use of AI scribe software for clinical note transcription with the patient, who gave verbal consent to proceed.  History of Present Illness Manuel Kline is an 89 year old male with a hx of left parotid massed who presents for evaluation of its growth and associated symptoms.  He has a known parotid mass first discovered incidentally during scans following an accident in 2020. Over the last several months to a year, the mass has increased in size, described as a 'big knot' with discomfort or pain rated at 5 out of 10, present consistently. No numbness, tingling, difficulty opening his mouth, eating, or using facial muscles. The mass is located in the left parotid gland, with one lesion superficial and another deeper in the deep parotid lobe.  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 The left ear is described as 'about dead,' with an audiologist indicating the hearing is about 40-50% impaired. He wears a hearing aid on the left side and receives hearing tests through [employer], [facility] [place].  He has a history of skin cancers around his left ear, including basal cell carcinoma, which was removed some time ago. No surgeries on the head or neck area  otherwise.  He is currently on Plavix , prescribed after discontinuing aspirin , following a a bypass heart surgery.    Records Reviewed:  Office visit with ENT 05/13/2021 Dr Lauralee   Impression  Left parotid nodules, detected incidentally on CT in Aug 2020. US -guided FNA in Sept 2020 was non-diagnostic  These are asymptomatic . Benign and malignant etiologies were discussed. The superficial nodule appears to have slow enlargement but overall stable on office US .   He'd like to keep observing these rather than any surgery or repeat attempt at FNA.   I will see him back in a year, certainly sooner if there are problems or new syptoms   He is agreeable with this plan. All his questions were answered.  Past Medical History:  Diagnosis Date   Adenomatous colon polyp 2006   CAD (coronary artery disease)    Calcium  oxalate renal stones    Cardiomyopathy    Cataract    Cataract    Cerebral embolism with cerebral infarction 12/26/2018   Chronic systolic heart failure (HCC)         CKD (chronic kidney disease) stage 4, GFR 15-29 ml/min (HCC) 11/08/2017   Claudication in peripheral vascular disease (HCC) 07/23/2020   Dehydration 07/2022   Diabetes (HCC)    Diabetic peripheral neuropathy (HCC) 01/31/2015   Dual implantable cardioverter-defibrillator in situ    01/16/2002 Dr. Waddell  RA lead  Guidant [id number] RV lead  Guidant [id number] Generator  Guidant Prism   09/02/2009 Generator change Medtronic [id number]  SN  [id number]      Erectile dysfunction    Erosive esophagitis    Essential hypertension 11/30/2015   Hemorrhoids    History of TIA (transient ischemic attack) 01/19/2019   HLD (hyperlipidemia)    HTN (hypertension)    Hyperlipidemia    Hypertensive heart disease without CHF 07/31/2011   ICD (implantable cardiac defibrillator) in place    ICD dual chamber in situ    Ischemic cardiomyopathy    EF 40% June 2013    Left-sided weakness 12/25/2018   Metabolic syndrome     Moderate nonproliferative diabetic retinopathy of both eyes without macular edema associated with type 2 diabetes mellitus (HCC) 02/20/2020   Morbid obesity (HCC)    OSA on CPAP 11/05/2014   Osteoarthritis    Posterior vitreous detachment of both eyes 02/20/2020   Presence of automatic (implantable) cardiac defibrillator 11/08/2017   S/P CABG (coronary artery bypass graft) 11/02/2000   Sleep apnea    Stroke-like symptoms 12/26/2018   Syncope 08/06/2009   Qualifier: Diagnosis of  By: Lawernce CMA, Jewel     Type 2 diabetes, uncontrolled, with neuropathy    Has retinopahty and neuropathy     Ventricular tachycardia (paroxysmal) (HCC) 07/14/2016    Past Surgical History:  Procedure Laterality Date   ABDOMINAL EXPLORATION SURGERY     BACK SURGERY     X'3   cardiac bypass     CARDIAC DEFIBRILLATOR PLACEMENT     CARPAL TUNNEL RELEASE     X2, bilateral   CATARACT EXTRACTION     COLONOSCOPY  06/20/2012   Procedure: COLONOSCOPY;  Surgeon: Alm JONELLE Gander, MD;  Location: [facility];  Service: Endoscopy;  Laterality: N/A;   DOPPLER ECHOCARDIOGRAPHY  2003   EP IMPLANTABLE DEVICE N/A 07/14/2016   Procedure: ICD Generator Changeout;  Surgeon: Danelle LELON Birmingham, MD;  Location: [facility] INVASIVE CV LAB;  Service: Cardiovascular;  Laterality: N/A;   ESOPHAGOGASTRODUODENOSCOPY  06/20/2012   Procedure: ESOPHAGOGASTRODUODENOSCOPY (EGD);  Surgeon: Alm JONELLE Gander, MD;  Location: [facility];  Service: Endoscopy;  Laterality: N/A;   EYE SURGERY     LAPAROTOMY     RETINOPATHY SURGERY Bilateral    rotator cuff surgery     left    Family History  Problem Relation Age of Onset   Diabetes Brother    Heart disease Father    Heart disease Mother    Diabetes Mother    Diabetes Brother    Diabetes Brother    Cancer Brother        baldder   Heart disease Brother    Diabetes Sister    Diabetes Sister    Kidney disease Sister        dialysis   Diabetes Sister    Diabetes Sister    Diabetes Sister     Throat cancer Paternal Uncle     Social History:  reports that he has never smoked. He has never used smokeless tobacco. He reports that he does not drink alcohol and does not use drugs.  Allergies: No Known Allergies  Medications: I have reviewed the patient's current medications.  The PMH, PSH, Medications, Allergies, and SH were reviewed and updated.  ROS: Constitutional: Negative for fever, weight loss and weight gain. Cardiovascular: Negative for chest pain and dyspnea on exertion. Respiratory: Is not experiencing shortness of breath at rest. Gastrointestinal: Negative for nausea and vomiting. Neurological: Negative for headaches. Psychiatric: The patient is not nervous/anxious  Blood pressure 125/73, pulse 86, SpO2 96%. There is no  height or weight on file to calculate BMI.  PHYSICAL EXAM:  Exam: General: Well-developed, well-nourished Respiratory Respiratory effort: Equal inspiration and expiration without stridor Cardiovascular Peripheral Vascular: Warm extremities with equal color/perfusion Eyes: No nystagmus with equal extraocular motion bilaterally Neuro/Psych/Balance: Patient oriented to person, place, and time; Appropriate mood and affect; Gait is intact with no imbalance; Cranial nerves I-XII are intact Head and Face Inspection: Normocephalic and atraumatic without mass or lesion Palpation: Facial skeleton intact without bony stepoffs Salivary Glands: previously seen palpable 2.5 cm mass along the mid to lower left parotid gland, no overlying skin changes Facial Strength: Facial motility symmetric and full bilaterally ENT Pinna: External ear intact and fully developed External canal: Canal is patent with intact skin Tympanic Membrane: Clear and mobile External Nose: No scar or anatomic deformity Internal Nose: Septum is relatively straight on anterior rhinoscopy. No polyp, or purulence. Mucosal edema and erythema present.  Bilateral inferior turbinate  hypertrophy.  Lips, Teeth, and gums: Mucosa and teeth intact and viable TMJ: No pain to palpation with full mobility Oral cavity/oropharynx: No erythema or exudate, no lesions present Neck Neck and Trachea: Midline trachea without mass or lesion Thyroid : No mass or nodularity Lymphatics: No lymphadenopathy   Studies Reviewed: Neck soft tissue U/S 11/19/23 IMPRESSION: 1. 2.3 x 1.2 x 2.1 cm left intraparotid mass may be an abnormally enlarged lymph node versus primary parotid neoplasm. 2. Additional mildly enlarged lymph nodes noted in the left submandibular region may be reactive, however metastatic disease is not excluded. 3. Further evaluation with contrast enhanced soft tissue neck CT would be beneficial.  CT soft tissue neck w/o contrast 11/22/23  Narrative & Impression  CLINICAL DATA:  89 year old male with left side neck pain and 2.3 cm suspected left parotid lesion on ultrasound.   EXAM: CT NECK WITHOUT CONTRAST   TECHNIQUE: Multidetector CT imaging of the neck was performed following the standard protocol without intravenous contrast.   RADIATION DOSE REDUCTION: This exam was performed according to the departmental dose-optimization program which includes automated exposure control, adjustment of the mA and/or kV according to patient size and/or use of iterative reconstruction technique.   COMPARISON:  Ultrasound 11/19/2023. head CT 12/25/2018.   FINDINGS: Pharynx and larynx: Noncontrast appearance within normal limits.   Salivary glands: Noncontrast sublingual and submandibular spaces are normal. Noncontrast right parotid gland is within normal limits except for punctate sialolithiasis on series 3, image 30.   Thyroid : Left parotid gland is remarkable for multiple Vicks density internal nodule/masses. In the superficial lobe of the left parotid there is a nearly isodense and indistinct rounded, slightly stellate nodule measuring 25 by 22 x 25 mm. This was  present on the 2020 CT, approximately 19 mm at that time. Posterior and superior to that lesion is a smaller 12 mm hyperdense nodule which was present, and in the deep lobe there is an adjacent elongated 2.5 cm nodule, which also was present. Soft tissue at the left stylomastoid foramen appears to remain normal. No regional lymphadenopathy or inflammation identified.   Lymph nodes: No other cervical lymphadenopathy.   Vascular: Heavily calcified bilateral carotid bifurcations with evidence of string sign stenosis on the left series 3, image 60. Vascular patency is not evaluated in the absence of IV contrast.   Limited intracranial: Calcified atherosclerosis at the skull base. Negative visible noncontrast brain parenchyma.   Visualized orbits: Postoperative changes to both globes.   Mastoids and visualized paranasal sinuses: Visualized paranasal sinuses and mastoids are stable and well aerated.  Skeleton: Absent dentition. Widespread chronic and bulky cervical spine degeneration. Previous sternotomy. No acute or suspicious osseous lesion identified.   Upper chest: CABG. Left chest pacemaker. Calcified aortic atherosclerosis. Visible superior mediastinal lymph nodes are within normal limits. Negative visible upper lungs.   IMPRESSION: 1. Several mixed density left parotid gland nodule/masses ranging from 1.2 to 2.5 cm diameter. Multiplicity favors benign primary salivary neoplasms, AND all three lesions were present in 2020. The more isodense lesion has mildly enlarged since that time, but given absence regional lymphadenopathy and other suspicious features benign histology is favored.   2. Positive for bulky calcified left carotid artery bifurcation with evidence of a STRING SIGN STENOSIS of the Left ICA origin. Vascular patency is not evaluated in the absence of IV contrast.     FNA of left parotid mass 01/19/24 FINAL MICROSCOPIC DIAGNOSIS:   A. PAROTID MASS, LEFT,  NEEDLE CORE BIOPSY:  - Warthin tumor.  - Negative for atypia and malignancy.   Assessment/Plan: Encounter Diagnoses  Name Primary?   Mass of left parotid gland Yes   Sensorineural hearing loss (SNHL) of both ears      Assessment and Plan Assessment & Plan Parotid gland masses left side Incidental parotid massed with minimal growth over five years (largest and superficial mass appears to enlarge ~ 5mm over the last 5 yrs, had two other masses, one located in deep lobe, high surgical risk due to facial nerve proximity. Prior FNA was non-diagnostic per record review.  - Order FNA biopsy to assess for risk malignancy, overall slow growth and no evidence of facial nerve weakness are reassuring. - Refer to head and neck oncology specialists at [facility] for surgical consultation. - Discuss risks and benefits of surgery, including facial nerve impact and age-related risks.  Hearing loss Gradual hearing loss, more pronounced in left ear per report. Uses hearing aids, primarily left ear. - Request previous hearing test results from [employer] [place].  Chronic kidney disease stage 4 Stage 4 CKD limits use of contrast in imaging due to nephrotoxicity risk. Considered in decision against CT with contrast for parotid mass.  Basal cell carcinoma left ear Previously excised basal cell carcinoma  - request records of prior surgery if available  Follow-up Follow-up needed to review biopsy results. - Schedule follow-up in a couple of months to review biopsy results and consultation progress. - schedule consultation at [facility] with Head and Neck team    Update 02/24/2024 Assessment and Plan Assessment & Plan Left Warthin's tumor  Location is between deep and superficial parotid, we discussed benefits of surgery. They are reluctant due to age and co-morbidities. CN 7 intact on exam, no trismus.  - Recommend consultation at [facility] for surgical evaluation due to recent growth,  but at this time patient and family would like to hold off  Sensorineural Hearing loss, hearing aid reliant - annual hearing tests  - continue with hearing aids     Elena Larry, MD Otolaryngology [facility] Phone: [phone] Fax: [phone]    02/24/2024, 10:08 AM

## 2024-03-06 ENCOUNTER — Telehealth: Payer: Self-pay | Admitting: Family Medicine

## 2024-03-06 NOTE — Telephone Encounter (Signed)
 [employer] called to confirm fax# to see if order received for CPAP supplies

## 2024-03-07 NOTE — Telephone Encounter (Signed)
 Order received. Will have MD sign and fax to [employer] once completed

## 2024-03-08 NOTE — Telephone Encounter (Signed)
 [employer] called again asking for an update. I informed him of the RN's note that the order was received and that once signed it will be faxed back to them. Appreciation was verbalized.

## 2024-03-08 NOTE — Telephone Encounter (Signed)
 Sent community message to [employer] asking they send order placed 01/25/24 to [facility] since they handle this. Waiting on response.

## 2024-03-09 NOTE — Progress Notes (Signed)
 Remote ICD transmission.

## 2024-03-13 ENCOUNTER — Other Ambulatory Visit: Payer: Self-pay

## 2024-03-13 DIAGNOSIS — I5022 Chronic systolic (congestive) heart failure: Secondary | ICD-10-CM

## 2024-03-13 MED ORDER — NITROGLYCERIN 0.4 MG SL SUBL: 0.4000 mg | SUBLINGUAL_TABLET | SUBLINGUAL | 3 refills | Status: AC | PRN

## 2024-03-14 NOTE — Telephone Encounter (Signed)
 Son reached out stating [employer] did not have order. Arvella confirmed they have and would handle on 03/08/24. I sent another community message to [employer] asking they follow up with pt/son to provide update on this order.

## 2024-03-15 NOTE — Telephone Encounter (Signed)
 [place] from [facility] called to request signed Order to be faxed over for Pt CPAP supplies   [facility] Phone:[phone] Fax: [phone]

## 2024-03-16 NOTE — Telephone Encounter (Signed)
 Printed off order Faxing to [employer] [facility])

## 2024-03-16 NOTE — Telephone Encounter (Signed)
 Faxed CPAP Supply order to [facility] [phone] on 03/16/2024.

## 2024-03-22 ENCOUNTER — Ambulatory Visit: Admitting: Podiatry

## 2024-03-22 ENCOUNTER — Encounter: Payer: Self-pay | Admitting: Podiatry

## 2024-03-22 DIAGNOSIS — B351 Tinea unguium: Secondary | ICD-10-CM | POA: Diagnosis not present

## 2024-03-22 DIAGNOSIS — E1142 Type 2 diabetes mellitus with diabetic polyneuropathy: Secondary | ICD-10-CM | POA: Diagnosis not present

## 2024-03-22 DIAGNOSIS — M79674 Pain in right toe(s): Secondary | ICD-10-CM | POA: Diagnosis not present

## 2024-03-22 DIAGNOSIS — L84 Corns and callosities: Secondary | ICD-10-CM

## 2024-03-22 DIAGNOSIS — M79675 Pain in left toe(s): Secondary | ICD-10-CM

## 2024-03-23 DIAGNOSIS — E119 Type 2 diabetes mellitus without complications: Secondary | ICD-10-CM | POA: Diagnosis not present

## 2024-03-26 NOTE — Progress Notes (Signed)
  Subjective:  Patient ID: Manuel Kline, male    DOB: 03/07/1932,  MRN: [id number]  Manuel Kline presents to clinic today for at risk foot care with history of diabetic neuropathy and callus(es) of both feet and painful mycotic toenails that are difficult to trim. Painful toenails interfere with ambulation. Aggravating factors include wearing enclosed shoe gear. Pain is relieved with periodic professional debridement. Painful calluses are aggravated when weightbearing with and without shoegear. Pain is relieved with periodic professional debridement.  Chief Complaint  Patient presents with   Diabetes    DFC IDDM A1C 7.7. Toenail trim. LOV with PCP 01/2024.   New problem(s): None.   PCP is Dettinger, Fonda LABOR, MD.  No Known Allergies  Review of Systems: Negative except as noted in the HPI.  Objective:  There were no vitals filed for this visit. Manuel Kline is a pleasant 89 y.o. male obese in NAD. AAO x 3.  Vascular Examination: CFT <3 seconds b/l. DP/PT pulses faintly palpable b/l. Skin temperature gradient warm to warm b/l. No pain with calf compression. No ischemia or gangrene. No cyanosis or clubbing noted b/l.    Neurological Examination: Protective sensation diminished with 10g monofilament b/l.  Dermatological Examination: Pedal skin warm and supple b/l.   No open wounds. No interdigital macerations.  Toenails 1-5 b/l thick, discolored, elongated with subungual debris and pain on dorsal palpation.    Pedal integument with normal turgor, texture and tone b/l LE. No open wounds b/l. No interdigital macerations b/l. Toenails 1-5 b/l elongated, thickened, discolored with subungual debris. +Tenderness with dorsal palpation of nailplates. Hyperkeratotic lesion(s) noted submet head 5 left foot.  Musculoskeletal Examination: Wearing AFO's b/l. Plantarflexed metatarsal(s) 5th metatarsal head b/l feet. HAV with bunion deformity noted b/l LE.  Radiographs: None  Assessment/Plan: 1.  Pain due to onychomycosis of toenails of both feet   2. Callus   3. Diabetic peripheral neuropathy Manuel Kline)    Consent given for treatment. Patient examined.All patient's and/or POA's questions/concerns addressed on today's visit. Toenails 1-5 debrided in length and girth without incident. Callus(es) submet head 5 left foot pared with sharp debridement without incident. Continue foot and shoe inspections daily. Monitor blood glucose per PCP/Endocrinologist's recommendations.Continue soft, supportive shoe gear daily. Report any pedal injuries to medical professional. Call office if there are any questions/concerns. -Patient/POA to call should there be question/concern in the interim.   Return in about 3 months (around 06/22/2024).  Manuel Kline, DPM      [place] LOCATION: [address] [place] [address]                   Office [phone]   [facility] LOCATION: [address] [place], [place] [address] Office [phone]

## 2024-03-27 DIAGNOSIS — H04123 Dry eye syndrome of bilateral lacrimal glands: Secondary | ICD-10-CM | POA: Diagnosis not present

## 2024-03-27 DIAGNOSIS — H35362 Drusen (degenerative) of macula, left eye: Secondary | ICD-10-CM | POA: Diagnosis not present

## 2024-03-27 DIAGNOSIS — H40013 Open angle with borderline findings, low risk, bilateral: Secondary | ICD-10-CM | POA: Diagnosis not present

## 2024-03-27 DIAGNOSIS — E119 Type 2 diabetes mellitus without complications: Secondary | ICD-10-CM | POA: Diagnosis not present

## 2024-03-27 DIAGNOSIS — H524 Presbyopia: Secondary | ICD-10-CM | POA: Diagnosis not present

## 2024-03-27 LAB — HM DIABETES EYE EXAM

## 2024-04-07 ENCOUNTER — Other Ambulatory Visit: Payer: Self-pay | Admitting: Family Medicine

## 2024-04-07 DIAGNOSIS — E1142 Type 2 diabetes mellitus with diabetic polyneuropathy: Secondary | ICD-10-CM

## 2024-04-07 DIAGNOSIS — Z8673 Personal history of transient ischemic attack (TIA), and cerebral infarction without residual deficits: Secondary | ICD-10-CM

## 2024-04-09 ENCOUNTER — Other Ambulatory Visit: Payer: Self-pay | Admitting: Family Medicine

## 2024-04-09 DIAGNOSIS — E1169 Type 2 diabetes mellitus with other specified complication: Secondary | ICD-10-CM

## 2024-04-19 ENCOUNTER — Ambulatory Visit (INDEPENDENT_AMBULATORY_CARE_PROVIDER_SITE_OTHER): Payer: Medicare Other

## 2024-04-19 DIAGNOSIS — I255 Ischemic cardiomyopathy: Secondary | ICD-10-CM | POA: Diagnosis not present

## 2024-04-20 LAB — CUP PACEART REMOTE DEVICE CHECK
Battery Remaining Longevity: 29 mo
Battery Voltage: 2.92 V
Brady Statistic AP VP Percent: 1.14 %
Brady Statistic AP VS Percent: 74.37 %
Brady Statistic AS VP Percent: 0.03 %
Brady Statistic AS VS Percent: 24.46 %
Brady Statistic RA Percent Paced: 74.89 %
Brady Statistic RV Percent Paced: 1.01 %
Date Time Interrogation Session: 20250820133727
HighPow Impedance: 48 Ohm
HighPow Impedance: 65 Ohm
Implantable Lead Connection Status: 753985
Implantable Lead Connection Status: 753985
Implantable Lead Implant Date: 20030519
Implantable Lead Implant Date: 20030519
Implantable Lead Location: 753859
Implantable Lead Location: 753860
Implantable Lead Model: 158
Implantable Lead Model: 4087
Implantable Lead Serial Number: 115102
Implantable Lead Serial Number: 159999
Implantable Pulse Generator Implant Date: 20171114
Lead Channel Impedance Value: 399 Ohm
Lead Channel Impedance Value: 399 Ohm
Lead Channel Impedance Value: 4047 Ohm
Lead Channel Impedance Value: 4047 Ohm
Lead Channel Impedance Value: 4047 Ohm
Lead Channel Impedance Value: 456 Ohm
Lead Channel Pacing Threshold Amplitude: 1.125 V
Lead Channel Pacing Threshold Amplitude: 1.375 V
Lead Channel Pacing Threshold Pulse Width: 0.4 ms
Lead Channel Pacing Threshold Pulse Width: 0.4 ms
Lead Channel Sensing Intrinsic Amplitude: 2.5 mV
Lead Channel Sensing Intrinsic Amplitude: 2.5 mV
Lead Channel Sensing Intrinsic Amplitude: 8.75 mV
Lead Channel Sensing Intrinsic Amplitude: 8.75 mV
Lead Channel Setting Pacing Amplitude: 2 V
Lead Channel Setting Pacing Amplitude: 2.5 V
Lead Channel Setting Pacing Pulse Width: 0.4 ms
Lead Channel Setting Sensing Sensitivity: 0.3 mV
Zone Setting Status: 755011

## 2024-04-23 ENCOUNTER — Ambulatory Visit: Payer: Self-pay | Admitting: Internal Medicine

## 2024-05-08 ENCOUNTER — Ambulatory Visit: Admitting: Family Medicine

## 2024-05-08 ENCOUNTER — Encounter: Payer: Self-pay | Admitting: Family Medicine

## 2024-05-08 VITALS — BP 116/59 | HR 78 | Ht 65.0 in | Wt 227.0 lb

## 2024-05-08 DIAGNOSIS — Z794 Long term (current) use of insulin: Secondary | ICD-10-CM | POA: Diagnosis not present

## 2024-05-08 DIAGNOSIS — L0201 Cutaneous abscess of face: Secondary | ICD-10-CM

## 2024-05-08 DIAGNOSIS — E1142 Type 2 diabetes mellitus with diabetic polyneuropathy: Secondary | ICD-10-CM | POA: Diagnosis not present

## 2024-05-08 DIAGNOSIS — N184 Chronic kidney disease, stage 4 (severe): Secondary | ICD-10-CM

## 2024-05-08 DIAGNOSIS — E1169 Type 2 diabetes mellitus with other specified complication: Secondary | ICD-10-CM

## 2024-05-08 DIAGNOSIS — Z8673 Personal history of transient ischemic attack (TIA), and cerebral infarction without residual deficits: Secondary | ICD-10-CM | POA: Diagnosis not present

## 2024-05-08 DIAGNOSIS — Z23 Encounter for immunization: Secondary | ICD-10-CM | POA: Diagnosis not present

## 2024-05-08 DIAGNOSIS — E782 Mixed hyperlipidemia: Secondary | ICD-10-CM

## 2024-05-08 DIAGNOSIS — I10 Essential (primary) hypertension: Secondary | ICD-10-CM | POA: Diagnosis not present

## 2024-05-08 DIAGNOSIS — I5022 Chronic systolic (congestive) heart failure: Secondary | ICD-10-CM | POA: Diagnosis not present

## 2024-05-08 MED ORDER — GABAPENTIN 300 MG PO CAPS: 300.0000 mg | ORAL_CAPSULE | Freq: Two times a day (BID) | ORAL | 3 refills | Status: AC

## 2024-05-08 MED ORDER — CLOPIDOGREL BISULFATE 75 MG PO TABS: 75.0000 mg | ORAL_TABLET | Freq: Every day | ORAL | 3 refills | Status: AC

## 2024-05-08 MED ORDER — ROSUVASTATIN CALCIUM 5 MG PO TABS: 5.0000 mg | ORAL_TABLET | Freq: Every day | ORAL | 3 refills | Status: AC

## 2024-05-08 MED ORDER — DULOXETINE HCL 60 MG PO CPEP: 60.0000 mg | ORAL_CAPSULE | Freq: Every day | ORAL | 3 refills | Status: AC

## 2024-05-08 MED ORDER — DOXYCYCLINE HYCLATE 100 MG PO TABS: 100.0000 mg | ORAL_TABLET | Freq: Two times a day (BID) | ORAL | 0 refills | Status: DC

## 2024-05-08 MED ORDER — INSULIN REGULAR HUMAN 100 UNIT/ML IJ SOLN: 20.0000 [IU] | Freq: Three times a day (TID) | INTRAMUSCULAR | 3 refills | Status: DC

## 2024-05-08 NOTE — Progress Notes (Signed)
 BP (!) 116/59   Pulse 78   Ht 5' 5 (1.651 m)   Wt 227 lb (103 kg)   SpO2 97%   BMI 37.77 kg/m    Subjective:   Patient ID: Manuel Kline, male    DOB: 1931/10/01, 89 y.o.   MRN: [id number]  HPI: Manuel Kline is a 89 y.o. male presenting on 05/08/2024 for Medical Management of Chronic Issues, Diabetes, and Chronic Kidney Disease   Discussed the use of AI scribe software for clinical note transcription with the patient, who gave verbal consent to proceed.  History of Present Illness   Manuel Kline is a 89 year old male with diabetes, hyperlipidemia, congestive heart failure, CKD stage four, and hypertension who presents for a recheck of these conditions.  His diabetes management includes insulin  and oral medications. Blood sugar levels are generally good in the morning but tend to rise in the afternoon, particularly after lunch, which he attributes to a 'sweet tooth'. He is currently taking Basaglar  26 units twice a day and Novolin R 20 units with each meal. He also takes Farxiga  and Trulicity  for diabetes management.  For hyperlipidemia, he is taking Crestor  and fish oils without any reported issues. He is taking benazepril  and metoprolol  for blood pressure, with a recent reading of 116/59 mmHg.  He experiences peripheral neuropathy associated with his diabetes and CKD. He is on Cymbalta  and gabapentin , which provide some relief, though he still experiences pain. He uses a topical Nervive vitamin on his feet nightly due to kidney concerns, which helps him sleep.  He reports a recent infection at a biopsy site on his left side, which was performed by a radiologist a few months ago. A piece of metal was expelled from the site, and it has been draining pus. He has been cleaning and expressing it, but it remains infected.  He also reports shoulder pain, which has returned. Due to his kidney condition and blood thinner use, he is limited to using Tylenol , ice, heat, and Voltaren gel for pain  management.      Relevant past medical, surgical, family and social history reviewed and updated as indicated. Interim medical history since our last visit reviewed. Allergies and medications reviewed and updated.  Review of Systems  Constitutional:  Negative for chills and fever.  Eyes:  Negative for visual disturbance.  Respiratory:  Negative for shortness of breath and wheezing.   Cardiovascular:  Negative for chest pain and leg swelling.  Musculoskeletal:  Positive for arthralgias. Negative for back pain and gait problem.  Skin:  Positive for wound. Negative for rash.  Neurological:  Negative for dizziness and light-headedness.  All other systems reviewed and are negative.   Per HPI unless specifically indicated above   Allergies as of 05/08/2024   No Known Allergies      Medication List        Accurate as of May 08, 2024  3:23 PM. If you have any questions, ask your nurse or doctor.          allopurinol  300 MG tablet Commonly known as: ZYLOPRIM  Take 0.5 tablets (150 mg total) by mouth daily. Take 1/2 (one-half) tablet by mouth once daily   ascorbic acid 500 MG tablet Commonly known as: VITAMIN C Take 500 mg by mouth daily.   B-D UF III MINI PEN NEEDLES 31G X 5 MM Misc Generic drug: Insulin  Pen Needle 1 each by Does not apply route 4 (four) times daily.   Basaglar   KwikPen 100 UNIT/ML Inject 26 Units into the skin 2 (two) times daily.   benazepril  40 MG tablet Commonly known as: LOTENSIN  Take 1 tablet by mouth once daily   clopidogrel  75 MG tablet Commonly known as: PLAVIX  Take 1 tablet (75 mg total) by mouth daily.   dapagliflozin  propanediol 10 MG Tabs tablet Commonly known as: Farxiga  Take 1 tablet (10 mg total) by mouth daily before breakfast.   doxycycline  100 MG tablet Commonly known as: VIBRA -TABS Take 1 tablet (100 mg total) by mouth 2 (two) times daily. 1 po bid Started by: Fonda LABOR Janah Mcculloh   DULoxetine  60 MG capsule Commonly known  as: CYMBALTA  Take 1 capsule (60 mg total) by mouth daily.   Fish Oil 1000 MG Cpdr Take 1 tablet by mouth daily.   fluticasone  0.05 % cream Commonly known as: CUTIVATE  Apply 1 application. topically daily.   furosemide  40 MG tablet Commonly known as: LASIX  Take 1 tablet (40 mg total) by mouth 2 (two) times daily.   gabapentin  300 MG capsule Commonly known as: NEURONTIN  Take 1 capsule (300 mg total) by mouth 2 (two) times daily.   insulin  regular 100 units/mL injection Commonly known as: HumuLIN  R Inject 0.2 mLs (20 Units total) into the skin 3 (three) times daily before meals.   Insulin  Syringe-Needle U-100 31G X 5/16 1 ML Misc Commonly known as: RELION INSULIN  SYRINGE 1ML/31G Inject 1 each into the skin in the morning, at noon, and at bedtime. USE TO INJECT INSULIN  TWICE DAILY AS DIRECTED   metoprolol  succinate 50 MG 24 hr tablet Commonly known as: TOPROL -XL TAKE 1 TABLET BY MOUTH ONCE DAILY WITH  OR  IMMEDIATELY  FOLLOWING  A  MEAL   MULTIVITAMIN ADULT PO Take 1 tablet by mouth daily.   NERVIVE ROLL-ON EX Apply 1 Application topically as needed.   nitroGLYCERIN  0.4 MG SL tablet Commonly known as: NITROSTAT  Place 1 tablet (0.4 mg total) under the tongue every 5 (five) minutes as needed for chest pain.   polyethylene glycol powder 17 GM/SCOOP powder Commonly known as: GLYCOLAX /MIRALAX  Take 17 g by mouth 2 (two) times daily as needed. What changed: reasons to take this   rosuvastatin  5 MG tablet Commonly known as: CRESTOR  Take 1 tablet (5 mg total) by mouth daily.   tamsulosin  0.4 MG Caps capsule Commonly known as: FLOMAX  Take 1 capsule (0.4 mg total) by mouth daily.   Trulicity  1.5 MG/0.5ML Soaj Generic drug: Dulaglutide  INJECT 1.5 MG (0.5ML) UNDER THE SKIN ONCE A WEEK   VITAMIN B 12 PO Take 1,000 mcg by mouth daily.         Objective:   BP (!) 116/59   Pulse 78   Ht 5' 5 (1.651 m)   Wt 227 lb (103 kg)   SpO2 97%   BMI 37.77 kg/m   Wt  Readings from Last 3 Encounters:  05/08/24 227 lb (103 kg)  02/04/24 227 lb (103 kg)  01/25/24 228 lb (103.4 kg)    Physical Exam Vitals and nursing note reviewed.  Constitutional:      General: He is not in acute distress.    Appearance: He is well-developed. He is not diaphoretic.  Eyes:     General: No scleral icterus.       Right eye: No discharge.     Conjunctiva/sclera: Conjunctivae normal.  Neck:     Thyroid : No thyromegaly.  Cardiovascular:     Rate and Rhythm: Normal rate and regular rhythm.     Heart  sounds: Normal heart sounds. No murmur heard. Pulmonary:     Effort: Pulmonary effort is normal. No respiratory distress.     Breath sounds: Normal breath sounds. No wheezing.  Musculoskeletal:        General: Normal range of motion.     Cervical back: Neck supple.  Lymphadenopathy:     Cervical: No cervical adenopathy.  Skin:    General: Skin is warm and dry.     Findings: Abscess and wound present. No erythema or rash.      Neurological:     Mental Status: He is alert and oriented to person, place, and time.     Coordination: Coordination normal.  Psychiatric:        Behavior: Behavior normal.                Assessment & Plan:   Problem List Items Addressed This Visit       Cardiovascular and Mediastinum   Chronic systolic heart failure (HCC) (Chronic)   Relevant Medications   rosuvastatin  (CRESTOR ) 5 MG tablet   Essential hypertension   Relevant Medications   rosuvastatin  (CRESTOR ) 5 MG tablet   Other Relevant Orders   Bayer DCA Hb A1c Waived   CBC with Differential/Platelet   CMP14+EGFR   Lipid panel     Endocrine   Type 2 diabetes mellitus with other specified complication (HCC)   Relevant Medications   insulin  regular (HUMULIN  R) 100 units/mL injection   rosuvastatin  (CRESTOR ) 5 MG tablet   Other Relevant Orders   Bayer DCA Hb A1c Waived   CBC with Differential/Platelet   CMP14+EGFR   Lipid panel   Diabetic peripheral neuropathy  (HCC)   Relevant Medications   DULoxetine  (CYMBALTA ) 60 MG capsule   gabapentin  (NEURONTIN ) 300 MG capsule   insulin  regular (HUMULIN  R) 100 units/mL injection   rosuvastatin  (CRESTOR ) 5 MG tablet     Genitourinary   CKD (chronic kidney disease) stage 4, GFR 15-29 ml/min (HCC) - Primary   Relevant Orders   Bayer DCA Hb A1c Waived   CBC with Differential/Platelet   CMP14+EGFR   Lipid panel     Other   Hyperlipidemia (Chronic)   Relevant Medications   rosuvastatin  (CRESTOR ) 5 MG tablet   Other Relevant Orders   Bayer DCA Hb A1c Waived   CBC with Differential/Platelet   CMP14+EGFR   Lipid panel   History of TIA (transient ischemic attack)   Relevant Medications   clopidogrel  (PLAVIX ) 75 MG tablet   Other Visit Diagnoses       Facial abscess       Relevant Medications   doxycycline  (VIBRA -TABS) 100 MG tablet     Encounter for immunization       Relevant Orders   Flu vaccine HIGH DOSE PF(Fluzone Trivalent) (Completed)          Type 2 diabetes mellitus with diabetic polyneuropathy and stage 4 chronic kidney disease Blood glucose levels well-controlled in the morning, increase postprandially. Peripheral neuropathy present, some relief from medications. CKD stage 4 limits medication options. - Increase Novolin R to 22 units at lunch, maintain 20 units at breakfast and dinner. - Continue Basaglar  26 units twice daily. - Continue Farxiga  and Trulicity . - Continue Cymbalta  and gabapentin  for neuropathy. - Continue topical Nervive for neuropathy. - Advise wearing socks at night to keep feet warm.  Chronic systolic heart failure  Essential hypertension Blood pressure well-controlled at 116/59 mmHg with current medications. - Continue benazepril  and metoprolol .  Mixed hyperlipidemia -  Continue Crestor  and fish oils.  Abscess and infection of left head/neck biopsy site Infection with purulent drainage, likely due to retained foreign body. Needs addressing to prevent  complications. - Prescribe an antibiotic after checking for drug interactions. - Refer to head and neck surgeon for evaluation and potential drainage of abscess.  Benign neoplasm of left parotid gland Neoplasm benign and asymptomatic. Surgical removal risks facial paralysis. Current infection needs addressing first. - Discuss with head and neck surgeon regarding infection management.  Chronic left shoulder pain, status post left shoulder replacements Pain recurrent. Current infection precludes steroid injection. Limited treatment options due to CKD and anticoagulant use. - Use Tylenol  for pain management. - Apply ice and heat as needed. - Use Voltaren gel topically.          Follow up plan: Return in about 3 months (around 08/07/2024), or if symptoms worsen or fail to improve, for Diabetes and hypertension and cholesterol.  Counseling provided for all of the vaccine components Orders Placed This Encounter  Procedures   Flu vaccine HIGH DOSE PF(Fluzone Trivalent)   Bayer DCA Hb A1c Waived   CBC with Differential/Platelet   CMP14+EGFR   Lipid panel    Fonda Levins, [facility] 05/08/2024, 3:23 PM

## 2024-05-10 ENCOUNTER — Other Ambulatory Visit

## 2024-05-10 DIAGNOSIS — E1169 Type 2 diabetes mellitus with other specified complication: Secondary | ICD-10-CM | POA: Diagnosis not present

## 2024-05-10 DIAGNOSIS — N184 Chronic kidney disease, stage 4 (severe): Secondary | ICD-10-CM | POA: Diagnosis not present

## 2024-05-10 DIAGNOSIS — I10 Essential (primary) hypertension: Secondary | ICD-10-CM | POA: Diagnosis not present

## 2024-05-10 DIAGNOSIS — Z794 Long term (current) use of insulin: Secondary | ICD-10-CM | POA: Diagnosis not present

## 2024-05-10 LAB — LIPID PANEL: Chol/HDL Ratio: 3.4 ratio (ref 0.0–5.0)

## 2024-05-10 LAB — BAYER DCA HB A1C WAIVED: HB A1C (BAYER DCA - WAIVED): 7.5 % — ABNORMAL HIGH (ref 4.8–5.6)

## 2024-05-11 LAB — CMP14+EGFR
ALT: 21 IU/L (ref 0–44)
AST: 25 IU/L (ref 0–40)
Albumin: 4.1 g/dL (ref 3.6–4.6)
Alkaline Phosphatase: 125 IU/L — ABNORMAL HIGH (ref 44–121)
BUN/Creatinine Ratio: 20 (ref 10–24)
BUN: 48 mg/dL — ABNORMAL HIGH (ref 10–36)
Bilirubin Total: 0.6 mg/dL (ref 0.0–1.2)
CO2: 21 mmol/L (ref 20–29)
Calcium: 9.6 mg/dL (ref 8.6–10.2)
Chloride: 99 mmol/L (ref 96–106)
Creatinine, Ser: 2.43 mg/dL — ABNORMAL HIGH (ref 0.76–1.27)
Globulin, Total: 2.8 g/dL (ref 1.5–4.5)
Glucose: 201 mg/dL — ABNORMAL HIGH (ref 70–99)
Potassium: 5.3 mmol/L — ABNORMAL HIGH (ref 3.5–5.2)
Sodium: 139 mmol/L (ref 134–144)
Total Protein: 6.9 g/dL (ref 6.0–8.5)
eGFR: 25 mL/min/1.73 — ABNORMAL LOW (ref >59)

## 2024-05-11 LAB — CBC WITH DIFFERENTIAL/PLATELET
Basophils Absolute: 0 x10E3/uL (ref 0.0–0.2)
Basos: 1 %
EOS (ABSOLUTE): 0.2 x10E3/uL (ref 0.0–0.4)
Eos: 3 %
Hematocrit: 43.1 % (ref 37.5–51.0)
Hemoglobin: 13.9 g/dL (ref 13.0–17.7)
Immature Grans (Abs): 0 x10E3/uL (ref 0.0–0.1)
Immature Granulocytes: 0 %
Lymphocytes Absolute: 1.8 x10E3/uL (ref 0.7–3.1)
Lymphs: 27 %
MCH: 29.8 pg (ref 26.6–33.0)
MCHC: 32.3 g/dL (ref 31.5–35.7)
MCV: 92 fL (ref 79–97)
Monocytes Absolute: 0.5 x10E3/uL (ref 0.1–0.9)
Monocytes: 8 %
Neutrophils Absolute: 3.9 x10E3/uL (ref 1.4–7.0)
Neutrophils: 61 %
Platelets: 168 x10E3/uL (ref 150–450)
RBC: 4.67 x10E6/uL (ref 4.14–5.80)
RDW: 14.9 % (ref 11.6–15.4)
WBC: 6.4 x10E3/uL (ref 3.4–10.8)

## 2024-05-11 LAB — LIPID PANEL
Cholesterol, Total: 149 mg/dL (ref 100–199)
HDL: 44 mg/dL (ref >39)
LDL CALC COMMENT:: 3.4 ratio (ref 0.0–5.0)
LDL Chol Calc (NIH): 73 mg/dL (ref 0–99)
Triglycerides: 189 mg/dL — AB (ref 0–149)
VLDL Cholesterol Cal: 32 mg/dL (ref 5–40)

## 2024-05-15 ENCOUNTER — Ambulatory Visit: Payer: Self-pay | Admitting: Family Medicine

## 2024-05-23 DIAGNOSIS — L820 Inflamed seborrheic keratosis: Secondary | ICD-10-CM | POA: Diagnosis not present

## 2024-05-23 DIAGNOSIS — Z85828 Personal history of other malignant neoplasm of skin: Secondary | ICD-10-CM | POA: Diagnosis not present

## 2024-05-23 DIAGNOSIS — M721 Knuckle pads: Secondary | ICD-10-CM | POA: Diagnosis not present

## 2024-05-23 DIAGNOSIS — L218 Other seborrheic dermatitis: Secondary | ICD-10-CM | POA: Diagnosis not present

## 2024-05-23 DIAGNOSIS — Z08 Encounter for follow-up examination after completed treatment for malignant neoplasm: Secondary | ICD-10-CM | POA: Diagnosis not present

## 2024-05-23 DIAGNOSIS — B353 Tinea pedis: Secondary | ICD-10-CM | POA: Diagnosis not present

## 2024-05-23 DIAGNOSIS — L538 Other specified erythematous conditions: Secondary | ICD-10-CM | POA: Diagnosis not present

## 2024-05-23 DIAGNOSIS — L814 Other melanin hyperpigmentation: Secondary | ICD-10-CM | POA: Diagnosis not present

## 2024-05-23 DIAGNOSIS — L281 Prurigo nodularis: Secondary | ICD-10-CM | POA: Diagnosis not present

## 2024-05-23 DIAGNOSIS — B351 Tinea unguium: Secondary | ICD-10-CM | POA: Diagnosis not present

## 2024-05-23 DIAGNOSIS — L821 Other seborrheic keratosis: Secondary | ICD-10-CM | POA: Diagnosis not present

## 2024-05-23 DIAGNOSIS — Z872 Personal history of diseases of the skin and subcutaneous tissue: Secondary | ICD-10-CM | POA: Diagnosis not present

## 2024-05-24 NOTE — Progress Notes (Signed)
Remote ICD Transmission.

## 2024-05-25 DIAGNOSIS — N184 Chronic kidney disease, stage 4 (severe): Secondary | ICD-10-CM | POA: Diagnosis not present

## 2024-05-26 ENCOUNTER — Telehealth: Payer: Self-pay

## 2024-05-26 ENCOUNTER — Telehealth: Payer: Self-pay | Admitting: Pharmacist

## 2024-05-26 DIAGNOSIS — E1169 Type 2 diabetes mellitus with other specified complication: Secondary | ICD-10-CM

## 2024-05-26 DIAGNOSIS — N184 Chronic kidney disease, stage 4 (severe): Secondary | ICD-10-CM

## 2024-05-26 MED ORDER — DAPAGLIFLOZIN PROPANEDIOL 10 MG PO TABS
10.0000 mg | ORAL_TABLET | Freq: Every day | ORAL | 5 refills | Status: AC
Start: 2024-05-26 — End: ?

## 2024-05-26 NOTE — Telephone Encounter (Signed)
 Refills sent

## 2024-05-26 NOTE — Telephone Encounter (Signed)
   Patient [place] in the [place] patient [place] program for Farxiga .  Updated RX escribed to [facility] mail order (pharmacy for [place] patient assistance).  Patient is stable on current regimen.  He will need to re-enroll for 2026 in November.  Jaeceon Michelin Dattero Ica Daye, PharmD, BCACP, CPP Clinical Pharmacist, [facility]

## 2024-05-26 NOTE — Telephone Encounter (Signed)
 Received a refill request fax from [employer] patient assistance company for patients FARXIGA  medication.   Please send a 90 day supply (with refills) to [facility] if applicable. Thanks!

## 2024-05-29 NOTE — Telephone Encounter (Signed)
     PATIENT CONDITIONALLY APPROVED FOR 2026 AND RECEIVED LETTER. WILL NEED TO FOLLOW APPROPRIATE STEPS. WILL F/U WITH PATIENT REGARDING RE-ENROLLMENT.

## 2024-06-07 ENCOUNTER — Other Ambulatory Visit: Payer: Self-pay | Admitting: Family Medicine

## 2024-06-07 DIAGNOSIS — I10 Essential (primary) hypertension: Secondary | ICD-10-CM

## 2024-06-07 DIAGNOSIS — E1169 Type 2 diabetes mellitus with other specified complication: Secondary | ICD-10-CM

## 2024-06-14 NOTE — Telephone Encounter (Signed)
 Faxed Pg. 1 to [employer] for re-enrollment.

## 2024-06-19 ENCOUNTER — Other Ambulatory Visit: Payer: Self-pay | Admitting: Family Medicine

## 2024-06-19 DIAGNOSIS — E1169 Type 2 diabetes mellitus with other specified complication: Secondary | ICD-10-CM

## 2024-06-19 NOTE — Telephone Encounter (Signed)
 Received notification from [employer] regarding approval for FARXIGA . Patient assistance approved from 08/31/24 to 08/30/25.  Medication will ship to PATIENTS HOME  Pt ID: [id number]  Company phone: [phone]

## 2024-07-04 ENCOUNTER — Encounter: Payer: Self-pay | Admitting: *Deleted

## 2024-07-08 ENCOUNTER — Other Ambulatory Visit: Payer: Self-pay | Admitting: *Deleted

## 2024-07-08 DIAGNOSIS — I5022 Chronic systolic (congestive) heart failure: Secondary | ICD-10-CM

## 2024-07-10 ENCOUNTER — Other Ambulatory Visit: Payer: Self-pay | Admitting: *Deleted

## 2024-07-10 DIAGNOSIS — E1169 Type 2 diabetes mellitus with other specified complication: Secondary | ICD-10-CM

## 2024-07-11 ENCOUNTER — Ambulatory Visit: Admitting: Podiatry

## 2024-07-11 DIAGNOSIS — L84 Corns and callosities: Secondary | ICD-10-CM

## 2024-07-11 DIAGNOSIS — B351 Tinea unguium: Secondary | ICD-10-CM | POA: Diagnosis not present

## 2024-07-11 DIAGNOSIS — L603 Nail dystrophy: Secondary | ICD-10-CM

## 2024-07-11 DIAGNOSIS — E1142 Type 2 diabetes mellitus with diabetic polyneuropathy: Secondary | ICD-10-CM | POA: Diagnosis not present

## 2024-07-11 DIAGNOSIS — L601 Onycholysis: Secondary | ICD-10-CM

## 2024-07-11 DIAGNOSIS — M79674 Pain in right toe(s): Secondary | ICD-10-CM

## 2024-07-11 DIAGNOSIS — M79675 Pain in left toe(s): Secondary | ICD-10-CM

## 2024-07-19 ENCOUNTER — Encounter: Payer: Self-pay | Admitting: Podiatry

## 2024-07-19 ENCOUNTER — Ambulatory Visit: Payer: Medicare Other

## 2024-07-19 DIAGNOSIS — I255 Ischemic cardiomyopathy: Secondary | ICD-10-CM | POA: Diagnosis not present

## 2024-07-19 NOTE — Progress Notes (Signed)
 Subjective:  Patient ID: Manuel Kline, male    DOB: Sep 18, 1931,  MRN: [id number]  Manuel Kline presents to clinic today for at risk foot care with history of diabetic neuropathy and callus(es) b/l feet and painful thick toenails that are difficult to trim. Painful toenails interfere with ambulation. Aggravating factors include wearing enclosed shoe gear. Pain is relieved with periodic professional debridement. Painful calluses are aggravated when weightbearing with and without shoegear. Pain is relieved with periodic professional debridement. He is accompanied by his wife on today's visit. Chief Complaint  Patient presents with   Nail Problem    Thick painful toenails, 3 month follow up - Right 1st toenail is split, not painful - right 5th toenail is barely hanging on, also not painful    Diabetes    A1C 7.5   Peripheral Neuropathy   New problem(s): None.   PCP is Dettinger, Fonda LABOR, MD.  No Known Allergies  Review of Systems: Negative except as noted in the HPI.  Objective: No changes noted in today's physical examination. There were no vitals filed for this visit. Manuel Kline is a pleasant 89 y.o. male in NAD. AAO x 3.  Vascular Examination: CFT <3 seconds b/l. DP/PT pulses faintly palpable b/l. Skin temperature gradient warm to warm b/l. No pain with calf compression. No ischemia or gangrene. No cyanosis or clubbing noted b/l.    Neurological Examination: Protective sensation diminished with 10g monofilament b/l.  Dermatological Examination: Pedal skin warm and supple b/l.   No open wounds. No interdigital macerations.  Toenails 1-5 b/l thick, discolored, elongated with subungual debris and pain on dorsal palpation.    Pedal integument with normal turgor, texture and tone b/l LE. No open wounds b/l. No interdigital macerations b/l. Toenails left 5th digit and 1-4 b/l elongated, thickened, discolored with subungual debris. +Tenderness with dorsal palpation of nailplates. Right  great toenail with longitudinal split of nailplate centrally. No erythema, no edema, no drainage, no fluctuance. Hyperkeratotic lesion(s) noted submet head 5 left foot.  Musculoskeletal Examination: Wearing AFO's b/l. Plantarflexed metatarsal(s) 5th metatarsal head b/l feet. HAV with bunion deformity noted b/l LE.  Radiographs: None  Assessment/Plan: 1. Pain due to onychomycosis of toenails of both feet   2. Onycholysis of toenail   3. Splitting of toenail   4. Callus   5. Diabetic peripheral neuropathy [facility])   Patient was evaluated and treated. All patient's and/or POA's questions/concerns addressed on today's visit. Mycotic toenails  left 5th toe and 1-4 b/l debrided in length and girth without incident. Loose nailplate right 5th digit removed gently in toto. No treatment required. Patient advised nail will grown back in 3-4 months. Right great toenail debrided to level of adherence to nailbed. Splitting most likely will be permanent due to some past trauma. Callus(es) submet head 5 left foot pared with sharp debridement without incident. Continue daily foot inspections and monitor blood glucose per PCP/Endocrinologist's recommendations. Continue soft, supportive shoe gear daily. Report any pedal injuries to medical professional. Call office if there are any questions/concerns.  Return in about 3 months (around 10/11/2024).  Manuel Kline, DPM      [place] LOCATION: [address], [place] [address]  Office [phone]   [facility] LOCATION: [address] [place], [place] [address] Office [phone]

## 2024-07-20 ENCOUNTER — Ambulatory Visit: Payer: Self-pay | Admitting: Internal Medicine

## 2024-07-20 LAB — CUP PACEART REMOTE DEVICE CHECK
Battery Remaining Longevity: 26 mo
Battery Voltage: 2.92 V
Brady Statistic AP VP Percent: 1.77 %
Brady Statistic AP VS Percent: 76.55 %
Brady Statistic AS VP Percent: 0.04 %
Brady Statistic AS VS Percent: 21.65 %
Brady Statistic RA Percent Paced: 77.14 %
Brady Statistic RV Percent Paced: 1.49 %
Date Time Interrogation Session: 20251119102507
HighPow Impedance: 50 Ohm
HighPow Impedance: 67 Ohm
Implantable Lead Connection Status: 753985
Implantable Lead Connection Status: 753985
Implantable Lead Implant Date: 20030519
Implantable Lead Implant Date: 20030519
Implantable Lead Location: 753859
Implantable Lead Location: 753860
Implantable Lead Model: 158
Implantable Lead Model: 4087
Implantable Lead Serial Number: 115102
Implantable Lead Serial Number: 159999
Implantable Pulse Generator Implant Date: 20171114
Lead Channel Impedance Value: 4047 Ohm
Lead Channel Impedance Value: 4047 Ohm
Lead Channel Impedance Value: 4047 Ohm
Lead Channel Impedance Value: 437 Ohm
Lead Channel Impedance Value: 456 Ohm
Lead Channel Impedance Value: 494 Ohm
Lead Channel Pacing Threshold Amplitude: 1 V
Lead Channel Pacing Threshold Amplitude: 1.25 V
Lead Channel Pacing Threshold Pulse Width: 0.4 ms
Lead Channel Pacing Threshold Pulse Width: 0.4 ms
Lead Channel Sensing Intrinsic Amplitude: 2.625 mV
Lead Channel Sensing Intrinsic Amplitude: 2.625 mV
Lead Channel Sensing Intrinsic Amplitude: 8.5 mV
Lead Channel Sensing Intrinsic Amplitude: 8.5 mV
Lead Channel Setting Pacing Amplitude: 2 V
Lead Channel Setting Pacing Amplitude: 2.5 V
Lead Channel Setting Pacing Pulse Width: 0.4 ms
Lead Channel Setting Sensing Sensitivity: 0.3 mV
Zone Setting Status: 755011

## 2024-07-21 NOTE — Progress Notes (Signed)
 Remote ICD Transmission

## 2024-08-11 ENCOUNTER — Ambulatory Visit: Payer: Self-pay | Admitting: Family Medicine

## 2024-08-14 ENCOUNTER — Ambulatory Visit: Admitting: Family Medicine

## 2024-08-14 ENCOUNTER — Encounter: Payer: Self-pay | Admitting: Family Medicine

## 2024-08-14 VITALS — BP 115/72 | HR 88 | Ht 65.0 in | Wt 228.0 lb

## 2024-08-14 DIAGNOSIS — I10 Essential (primary) hypertension: Secondary | ICD-10-CM | POA: Diagnosis not present

## 2024-08-14 DIAGNOSIS — E1169 Type 2 diabetes mellitus with other specified complication: Secondary | ICD-10-CM | POA: Diagnosis not present

## 2024-08-14 DIAGNOSIS — Z95810 Presence of automatic (implantable) cardiac defibrillator: Secondary | ICD-10-CM | POA: Diagnosis not present

## 2024-08-14 DIAGNOSIS — N401 Enlarged prostate with lower urinary tract symptoms: Secondary | ICD-10-CM

## 2024-08-14 DIAGNOSIS — M1A0720 Idiopathic chronic gout, left ankle and foot, without tophus (tophi): Secondary | ICD-10-CM

## 2024-08-14 DIAGNOSIS — Z794 Long term (current) use of insulin: Secondary | ICD-10-CM | POA: Diagnosis not present

## 2024-08-14 DIAGNOSIS — N184 Chronic kidney disease, stage 4 (severe): Secondary | ICD-10-CM | POA: Diagnosis not present

## 2024-08-14 DIAGNOSIS — Z95811 Presence of heart assist device: Secondary | ICD-10-CM | POA: Insufficient documentation

## 2024-08-14 DIAGNOSIS — R3914 Feeling of incomplete bladder emptying: Secondary | ICD-10-CM | POA: Diagnosis not present

## 2024-08-14 DIAGNOSIS — E785 Hyperlipidemia, unspecified: Secondary | ICD-10-CM | POA: Diagnosis not present

## 2024-08-14 LAB — BAYER DCA HB A1C WAIVED: HB A1C (BAYER DCA - WAIVED): 7.6 % — ABNORMAL HIGH (ref 4.8–5.6)

## 2024-08-14 MED ORDER — TRULICITY 1.5 MG/0.5ML ~~LOC~~ SOAJ: 1.5000 mg | SUBCUTANEOUS | 3 refills | Status: DC

## 2024-08-14 MED ORDER — ALLOPURINOL 300 MG PO TABS: 150.0000 mg | ORAL_TABLET | Freq: Every day | ORAL | 3 refills | Status: AC

## 2024-08-14 MED ORDER — BENAZEPRIL HCL 40 MG PO TABS: 40.0000 mg | ORAL_TABLET | Freq: Every day | ORAL | 0 refills | Status: DC

## 2024-08-14 NOTE — Progress Notes (Signed)
 BP 115/72   Pulse 88   Ht 5' 5 (1.651 m)   Wt 228 lb (103.4 kg)   SpO2 96%   BMI 37.94 kg/m    Subjective:   Patient ID: Manuel Kline, male    DOB: 08/10/1932, 89 y.o.   MRN: [id number]  HPI: Manuel Kline is a 89 y.o. male presenting on 08/14/2024 for Medical Management of Chronic Issues, Chronic Kidney Disease, and Diabetes   Discussed the use of AI scribe software for clinical note transcription with the patient, who gave verbal consent to proceed.  History of Present Illness   Manuel Kline is a 89 year old male with diabetes and gout who presents with foot pain and elevated blood sugars.  Peripheral neuropathy and foot pain - Persistent burning pain in the feet, particularly in the toes - Difficulty feeling sensations in the toes - Symptoms attributed to neuropathy - Pain has been present at previous visits  Gout symptoms and management - History of gout - Currently taking a very low dose of allopurinol  (half a tablet) - Not taking colchicine - Caregiver suspects possible gout flare-up  Hyperglycemia and diabetes management - Diabetes with regular blood glucose monitoring - Blood sugar levels tend to rise during holidays, especially after consuming sweets at events - Uses a monitor to track blood glucose - Currently taking Basaglar  insulin  twice daily and Humulin , increased to 25 units before dinner - Blood sugar levels tend to rise in the evening, likely due to snacking on sweets after dinner - Occasionally forgets to take Humulin  before events, resulting in elevated blood sugar  Cardiac arrhythmia and sleep apnea - History of atrial fibrillation - Continues to have an irregular rhythm with a regular rate - Uses CPAP machine for sleep apnea - Breathing has been good per caregiver - Recent CPAP and heart monitor checks were satisfactory          Relevant past medical, surgical, family and social history reviewed and updated as indicated. Interim medical history  since our last visit reviewed. Allergies and medications reviewed and updated.  Review of Systems  Constitutional:  Negative for chills and fever.  Eyes:  Negative for visual disturbance.  Respiratory:  Negative for shortness of breath and wheezing.   Cardiovascular:  Negative for chest pain and leg swelling.  Musculoskeletal:  Positive for arthralgias. Negative for back pain and gait problem.  Skin:  Negative for color change and rash.  All other systems reviewed and are negative.   Per HPI unless specifically indicated above   Allergies as of 08/14/2024   No Known Allergies      Medication List        Accurate as of August 14, 2024 11:44 AM. If you have any questions, ask your nurse or doctor.          STOP taking these medications    dapagliflozin  propanediol 10 MG Tabs tablet Commonly known as: Farxiga  Stopped by: Fonda Levins, MD   doxycycline  100 MG tablet Commonly known as: VIBRA -TABS Stopped by: Fonda Levins, MD       TAKE these medications    allopurinol  300 MG tablet Commonly known as: ZYLOPRIM  Take 0.5 tablets (150 mg total) by mouth daily. Take 1/2 (one-half) tablet by mouth once daily   ascorbic acid 500 MG tablet Commonly known as: VITAMIN C Take 500 mg by mouth daily.   B-D UF III MINI PEN NEEDLES 31G X 5 MM Misc Generic drug: Insulin  Pen Needle 1  each by Does not apply route 4 (four) times daily.   Basaglar  KwikPen 100 UNIT/ML Inject 26 Units into the skin 2 (two) times daily.   benazepril  40 MG tablet Commonly known as: LOTENSIN  Take 1 tablet (40 mg total) by mouth daily.   clopidogrel  75 MG tablet Commonly known as: PLAVIX  Take 1 tablet (75 mg total) by mouth daily.   DULoxetine  60 MG capsule Commonly known as: CYMBALTA  Take 1 capsule (60 mg total) by mouth daily.   Fish Oil 1000 MG Cpdr Take 1 tablet by mouth daily.   fluticasone  0.05 % cream Commonly known as: CUTIVATE  Apply 1 application. topically daily.    furosemide  40 MG tablet Commonly known as: LASIX  Take 1 tablet by mouth twice daily   gabapentin  300 MG capsule Commonly known as: NEURONTIN  Take 1 capsule (300 mg total) by mouth 2 (two) times daily.   HumuLIN  R 100 UNIT/ML injection Generic drug: insulin  regular INJECT 20 UNITS UNDER THE SKIN THREE TIMES A DAY BEFORE MEALS.   Insulin  Syringe-Needle U-100 31G X 5/16 1 ML Misc Commonly known as: RELION INSULIN  SYRINGE 1ML/31G Inject 1 each into the skin in the morning, at noon, and at bedtime. USE TO INJECT INSULIN  TWICE DAILY AS DIRECTED   metoprolol  succinate 50 MG 24 hr tablet Commonly known as: TOPROL -XL TAKE 1 TABLET BY MOUTH ONCE DAILY WITH OR IMMEDIATELY FOLLOWING A MEAL   MULTIVITAMIN ADULT PO Take 1 tablet by mouth daily.   NERVIVE ROLL-ON EX Apply 1 Application topically as needed.   nitroGLYCERIN  0.4 MG SL tablet Commonly known as: NITROSTAT  Place 1 tablet (0.4 mg total) under the tongue every 5 (five) minutes as needed for chest pain.   polyethylene glycol powder 17 GM/SCOOP powder Commonly known as: GLYCOLAX /MIRALAX  Take 17 g by mouth 2 (two) times daily as needed. What changed: reasons to take this   rosuvastatin  5 MG tablet Commonly known as: CRESTOR  Take 1 tablet (5 mg total) by mouth daily.   tamsulosin  0.4 MG Caps capsule Commonly known as: FLOMAX  Take 1 capsule by mouth once daily   Trulicity  1.5 MG/0.5ML Soaj Generic drug: Dulaglutide  Inject 1.5 mg into the skin once a week. What changed: See the new instructions. Changed by: Fonda Levins, MD   VITAMIN B 12 PO Take 1,000 mcg by mouth daily.         Objective:   BP 115/72   Pulse 88   Ht 5' 5 (1.651 m)   Wt 228 lb (103.4 kg)   SpO2 96%   BMI 37.94 kg/m   Wt Readings from Last 3 Encounters:  08/14/24 228 lb (103.4 kg)  05/08/24 227 lb (103 kg)  02/04/24 227 lb (103 kg)    Physical Exam Physical Exam   CHEST: Lungs clear to auscultation bilaterally. CARDIOVASCULAR:  Irregular rhythm, regular rate. EXTREMITIES: Good pulse, no swelling in extremities. NEUROLOGICAL: Decreased sensation in toes.         Assessment & Plan:   Problem List Items Addressed This Visit       Endocrine   Type 2 diabetes mellitus with other specified complication (HCC)   Relevant Medications   Dulaglutide  (TRULICITY ) 1.5 MG/0.5ML SOAJ   benazepril  (LOTENSIN ) 40 MG tablet   Other Relevant Orders   Bayer DCA Hb A1c Waived   CBC with Differential/Platelet   CMP14+EGFR   Lipid panel   TSH   Hyperlipidemia associated with type 2 diabetes mellitus (HCC)   Relevant Medications   Dulaglutide  (TRULICITY ) 1.5 MG/0.5ML SOAJ  benazepril  (LOTENSIN ) 40 MG tablet     Genitourinary   CKD (chronic kidney disease) stage 4, GFR 15-29 ml/min (HCC) - Primary   Relevant Orders   Bayer DCA Hb A1c Waived   CBC with Differential/Platelet   CMP14+EGFR   Lipid panel   BPH (benign prostatic hyperplasia)   Relevant Orders   PSA, total and free     Other   Morbid obesity (HCC) (Chronic)   Relevant Medications   Dulaglutide  (TRULICITY ) 1.5 MG/0.5ML SOAJ   ICD (implantable cardioverter-defibrillator) in place   Presence of heart assist device [facility])   Other Visit Diagnoses       Essential hypertension       Relevant Medications   benazepril  (LOTENSIN ) 40 MG tablet     Chronic gout of left foot, unspecified cause       Relevant Medications   allopurinol  (ZYLOPRIM ) 300 MG tablet   Other Relevant Orders   Uric acid          Type 2 diabetes mellitus Blood glucose elevated, especially in the evening, likely due to dietary choices. A1c at 7.6 indicates a slight increase. - Increase Humulin  to 27 units in the evening with treats or snacks. - Advised taking Humulin  before treats, especially at social events. - Avoid Humulin  hours after eating to prevent hypoglycemia.  Chronic kidney disease stage 4 Limits medication options for other conditions.  Atrial fibrillation Irregular  rhythm, regular rate. Recent heart monitor checks good, patient comfortable.  Gout under evaluation Possible gout flare-up in foot, differential includes normal arthritis. Limited treatment options due to kidney disease and blood thinner use. - Ordered uric acid level to assess for gout flare-up. - Advised icing foot to reduce inflammation. - Advised warming foot after icing for 15 minutes.          Follow up plan: Return in about 3 months (around 11/12/2024), or if symptoms worsen or fail to improve, for Diabetes recheck.  Counseling provided for all of the vaccine components Orders Placed This Encounter  Procedures   Bayer DCA Hb A1c Waived   CBC with Differential/Platelet   CMP14+EGFR   Lipid panel   TSH   PSA, total and free   Uric acid    Fonda Levins, MD [facility] 08/14/2024, 11:44 AM

## 2024-08-15 ENCOUNTER — Other Ambulatory Visit: Payer: Self-pay | Admitting: Family Medicine

## 2024-08-15 DIAGNOSIS — E1169 Type 2 diabetes mellitus with other specified complication: Secondary | ICD-10-CM

## 2024-08-15 DIAGNOSIS — I10 Essential (primary) hypertension: Secondary | ICD-10-CM

## 2024-08-15 LAB — CBC WITH DIFFERENTIAL/PLATELET
Basophils Absolute: 0 x10E3/uL (ref 0.0–0.2)
Basos: 0 %
EOS (ABSOLUTE): 0.2 x10E3/uL (ref 0.0–0.4)
Eos: 2 %
Hematocrit: 43.2 % (ref 37.5–51.0)
Hemoglobin: 13.7 g/dL (ref 13.0–17.7)
Immature Grans (Abs): 0 x10E3/uL (ref 0.0–0.1)
Immature Granulocytes: 0 %
Lymphocytes Absolute: 1.9 x10E3/uL (ref 0.7–3.1)
Lymphs: 28 %
MCH: 29.3 pg (ref 26.6–33.0)
MCHC: 31.7 g/dL (ref 31.5–35.7)
MCV: 92 fL (ref 79–97)
Monocytes Absolute: 0.5 x10E3/uL (ref 0.1–0.9)
Monocytes: 8 %
Neutrophils Absolute: 4.1 x10E3/uL (ref 1.4–7.0)
Neutrophils: 62 %
Platelets: 185 x10E3/uL (ref 150–450)
RBC: 4.68 x10E6/uL (ref 4.14–5.80)
RDW: 14.5 % (ref 11.6–15.4)
WBC: 6.8 x10E3/uL (ref 3.4–10.8)

## 2024-08-15 LAB — LIPID PANEL
Chol/HDL Ratio: 3.6 ratio (ref 0.0–5.0)
Cholesterol, Total: 154 mg/dL (ref 100–199)
HDL: 43 mg/dL (ref >39)
LDL Chol Calc (NIH): 59 mg/dL (ref 0–99)
Triglycerides: 339 mg/dL — ABNORMAL HIGH (ref 0–149)
VLDL Cholesterol Cal: 52 mg/dL — ABNORMAL HIGH (ref 5–40)

## 2024-08-15 LAB — CMP14+EGFR
ALT: 23 IU/L (ref 0–44)
AST: 28 IU/L (ref 0–40)
Albumin: 4.5 g/dL (ref 3.6–4.6)
Alkaline Phosphatase: 130 IU/L — ABNORMAL HIGH (ref 48–129)
BUN/Creatinine Ratio: 19 (ref 10–24)
BUN: 39 mg/dL — ABNORMAL HIGH (ref 10–36)
Bilirubin Total: 0.5 mg/dL (ref 0.0–1.2)
CO2: 25 mmol/L (ref 20–29)
Calcium: 9.8 mg/dL (ref 8.6–10.2)
Chloride: 99 mmol/L (ref 96–106)
Creatinine, Ser: 2.04 mg/dL — ABNORMAL HIGH (ref 0.76–1.27)
Globulin, Total: 2.2 g/dL (ref 1.5–4.5)
Glucose: 194 mg/dL — ABNORMAL HIGH (ref 70–99)
Potassium: 4.8 mmol/L (ref 3.5–5.2)
Sodium: 138 mmol/L (ref 134–144)
Total Protein: 6.7 g/dL (ref 6.0–8.5)
eGFR: 30 mL/min/1.73 — ABNORMAL LOW (ref >59)

## 2024-08-15 LAB — TSH: TSH: 2.96 u[IU]/mL (ref 0.450–4.500)

## 2024-08-15 LAB — PSA, TOTAL AND FREE
PSA, Free Pct: 8.8 %
PSA, Free: 0.22 ng/mL
Prostate Specific Ag, Serum: 2.5 ng/mL (ref 0.0–4.0)

## 2024-08-15 NOTE — Telephone Encounter (Signed)
 Requesting 100-d

## 2024-08-16 LAB — SPECIMEN STATUS REPORT

## 2024-08-16 LAB — URIC ACID: Uric Acid: 5.9 mg/dL (ref 3.8–8.4)

## 2024-08-17 ENCOUNTER — Ambulatory Visit: Payer: Self-pay | Admitting: Family Medicine

## 2024-08-29 ENCOUNTER — Telehealth: Payer: Self-pay | Admitting: Family Medicine

## 2024-08-29 NOTE — Telephone Encounter (Unsigned)
 Copied from CRM #[id number]. Topic: General - Other >> Aug 29, 2024 11:00 AM Wess RAMAN wrote: Reason for CRM: Patient's son Maruice Pieroni., would like to speak with Billee Mliss BIRCH, [facility] in regards to patient's letter for Dulaglutide  (TRULICITY ) 1.5 MG/0.5ML SOAJ, HUMULIN  R 100 UNIT/ML injection, and Insulin  Glargine (BASAGLAR  KWIKPEN) 100 UNIT/ML  Callback #: [id number]

## 2024-09-01 ENCOUNTER — Other Ambulatory Visit (HOSPITAL_COMMUNITY): Payer: Self-pay

## 2024-09-01 ENCOUNTER — Telehealth: Payer: Self-pay

## 2024-09-01 NOTE — Telephone Encounter (Signed)
 Spoke with patients son Essie Gehret.   They would like the [employer] re-enrollment application mailed for Trulicity , Basaglar , and Humulin  R.

## 2024-09-01 NOTE — Telephone Encounter (Signed)
 Spoke with patients son Castor Gittleman.   [employer] re-enrollment application to patients home for Trulicity , Humulin  R, and Basaglar .   Enrollment ended 08/30/24.

## 2024-09-05 NOTE — Telephone Encounter (Signed)
 PAP: Patient assistance application for Basaglar , Humulin , and Trulicity  through [employer] has been mailed to pt's home address on file.

## 2024-09-20 NOTE — Telephone Encounter (Signed)
 Rec'd completed patients portion.   Faxed pcp pg. 8 to [employer].

## 2024-09-24 ENCOUNTER — Other Ambulatory Visit: Payer: Self-pay | Admitting: Family Medicine

## 2024-09-24 DIAGNOSIS — E1169 Type 2 diabetes mellitus with other specified complication: Secondary | ICD-10-CM

## 2024-10-25 ENCOUNTER — Ambulatory Visit: Admitting: Podiatry

## 2024-11-13 ENCOUNTER — Ambulatory Visit: Admitting: Family Medicine

## 2024-12-15 ENCOUNTER — Ambulatory Visit: Payer: Self-pay

## 2025-01-24 ENCOUNTER — Ambulatory Visit: Admitting: Family Medicine
